# Patient Record
Sex: Female | Born: 1945 | Race: White | Hispanic: No | Smoking: Former smoker
Health system: Southern US, Community
[De-identification: ages and names within clinical notes are randomized; demographics above are authoritative.]

## PROBLEM LIST (undated history)

## (undated) DIAGNOSIS — M545 Low back pain, unspecified: Secondary | ICD-10-CM

## (undated) DIAGNOSIS — N3020 Other chronic cystitis without hematuria: Secondary | ICD-10-CM

## (undated) DIAGNOSIS — G43909 Migraine, unspecified, not intractable, without status migrainosus: Secondary | ICD-10-CM

## (undated) DIAGNOSIS — K589 Irritable bowel syndrome without diarrhea: Secondary | ICD-10-CM

## (undated) DIAGNOSIS — H269 Unspecified cataract: Secondary | ICD-10-CM

## (undated) DIAGNOSIS — Z860100 Personal history of colon polyps, unspecified: Secondary | ICD-10-CM

## (undated) DIAGNOSIS — I428 Other cardiomyopathies: Secondary | ICD-10-CM

## (undated) DIAGNOSIS — M797 Fibromyalgia: Secondary | ICD-10-CM

## (undated) DIAGNOSIS — G894 Chronic pain syndrome: Secondary | ICD-10-CM

## (undated) DIAGNOSIS — G8929 Other chronic pain: Secondary | ICD-10-CM

## (undated) DIAGNOSIS — M1990 Unspecified osteoarthritis, unspecified site: Secondary | ICD-10-CM

## (undated) DIAGNOSIS — T7840XA Allergy, unspecified, initial encounter: Secondary | ICD-10-CM

## (undated) DIAGNOSIS — E785 Hyperlipidemia, unspecified: Secondary | ICD-10-CM

## (undated) DIAGNOSIS — I779 Disorder of arteries and arterioles, unspecified: Secondary | ICD-10-CM

## (undated) DIAGNOSIS — K2270 Barrett's esophagus without dysplasia: Secondary | ICD-10-CM

## (undated) DIAGNOSIS — R42 Dizziness and giddiness: Secondary | ICD-10-CM

## (undated) DIAGNOSIS — E538 Deficiency of other specified B group vitamins: Secondary | ICD-10-CM

## (undated) DIAGNOSIS — Z9889 Other specified postprocedural states: Secondary | ICD-10-CM

## (undated) DIAGNOSIS — F419 Anxiety disorder, unspecified: Secondary | ICD-10-CM

## (undated) DIAGNOSIS — G4700 Insomnia, unspecified: Secondary | ICD-10-CM

## (undated) DIAGNOSIS — Z9289 Personal history of other medical treatment: Secondary | ICD-10-CM

## (undated) DIAGNOSIS — Z8489 Family history of other specified conditions: Secondary | ICD-10-CM

## (undated) DIAGNOSIS — K219 Gastro-esophageal reflux disease without esophagitis: Secondary | ICD-10-CM

## (undated) DIAGNOSIS — D649 Anemia, unspecified: Secondary | ICD-10-CM

## (undated) DIAGNOSIS — I5042 Chronic combined systolic (congestive) and diastolic (congestive) heart failure: Secondary | ICD-10-CM

## (undated) DIAGNOSIS — G5603 Carpal tunnel syndrome, bilateral upper limbs: Secondary | ICD-10-CM

## (undated) DIAGNOSIS — I10 Essential (primary) hypertension: Secondary | ICD-10-CM

## (undated) DIAGNOSIS — G2581 Restless legs syndrome: Secondary | ICD-10-CM

## (undated) DIAGNOSIS — A0472 Enterocolitis due to Clostridium difficile, not specified as recurrent: Secondary | ICD-10-CM

## (undated) DIAGNOSIS — F329 Major depressive disorder, single episode, unspecified: Secondary | ICD-10-CM

## (undated) DIAGNOSIS — F32A Depression, unspecified: Secondary | ICD-10-CM

## (undated) HISTORY — DX: Personal history of other medical treatment: Z92.89

## (undated) HISTORY — DX: Major depressive disorder, single episode, unspecified: F32.9

## (undated) HISTORY — DX: Insomnia, unspecified: G47.00

## (undated) HISTORY — PX: UPPER GASTROINTESTINAL ENDOSCOPY: SHX188

## (undated) HISTORY — DX: Anxiety disorder, unspecified: F41.9

## (undated) HISTORY — DX: Depression, unspecified: F32.A

## (undated) HISTORY — DX: Anemia, unspecified: D64.9

## (undated) HISTORY — DX: Carpal tunnel syndrome, bilateral upper limbs: G56.03

## (undated) HISTORY — DX: Barrett's esophagus without dysplasia: K22.70

## (undated) HISTORY — PX: OTHER SURGICAL HISTORY: SHX169

## (undated) HISTORY — PX: COLONOSCOPY: SHX174

## (undated) HISTORY — DX: Other chronic cystitis without hematuria: N30.20

## (undated) HISTORY — DX: Dizziness and giddiness: R42

## (undated) HISTORY — PX: OOPHORECTOMY: SHX86

## (undated) HISTORY — DX: Restless legs syndrome: G25.81

## (undated) HISTORY — DX: Enterocolitis due to Clostridium difficile, not specified as recurrent: A04.72

## (undated) HISTORY — DX: Unspecified cataract: H26.9

## (undated) HISTORY — PX: BILATERAL KNEE ARTHROSCOPY: SUR91

## (undated) HISTORY — PX: SHOULDER ARTHROSCOPY: SHX128

## (undated) HISTORY — DX: Unspecified osteoarthritis, unspecified site: M19.90

## (undated) HISTORY — DX: Allergy, unspecified, initial encounter: T78.40XA

## (undated) HISTORY — PX: COLONOSCOPY W/ POLYPECTOMY: SHX1380

## (undated) HISTORY — DX: Fibromyalgia: M79.7

## (undated) HISTORY — PX: LUMBAR DISC SURGERY: SHX700

## (undated) HISTORY — DX: Hyperlipidemia, unspecified: E78.5

## (undated) HISTORY — DX: Essential (primary) hypertension: I10

## (undated) HISTORY — DX: Gastro-esophageal reflux disease without esophagitis: K21.9

## (undated) HISTORY — DX: Other specified postprocedural states: Z98.890

## (undated) HISTORY — DX: Deficiency of other specified B group vitamins: E53.8

## (undated) HISTORY — PX: CATARACT EXTRACTION W/ INTRAOCULAR LENS  IMPLANT, BILATERAL: SHX1307

## (undated) HISTORY — PX: FOOT SURGERY: SHX648

## (undated) HISTORY — PX: VAGINAL HYSTERECTOMY: SUR661

---

## 1995-04-05 ENCOUNTER — Encounter: Payer: Self-pay | Admitting: Internal Medicine

## 1997-05-23 ENCOUNTER — Ambulatory Visit (HOSPITAL_BASED_OUTPATIENT_CLINIC_OR_DEPARTMENT_OTHER): Admission: RE | Admit: 1997-05-23 | Discharge: 1997-05-23 | Payer: Self-pay | Admitting: Orthopedic Surgery

## 1998-01-09 ENCOUNTER — Other Ambulatory Visit: Admission: RE | Admit: 1998-01-09 | Discharge: 1998-01-09 | Payer: Self-pay | Admitting: Gastroenterology

## 1998-01-09 ENCOUNTER — Encounter: Payer: Self-pay | Admitting: Gastroenterology

## 1998-11-27 ENCOUNTER — Emergency Department (HOSPITAL_COMMUNITY): Admission: EM | Admit: 1998-11-27 | Discharge: 1998-11-27 | Payer: Self-pay | Admitting: *Deleted

## 1998-11-27 ENCOUNTER — Encounter: Payer: Self-pay | Admitting: Emergency Medicine

## 1999-08-24 ENCOUNTER — Other Ambulatory Visit: Admission: RE | Admit: 1999-08-24 | Discharge: 1999-08-24 | Payer: Self-pay | Admitting: Gynecology

## 2001-03-23 ENCOUNTER — Encounter: Payer: Self-pay | Admitting: Gastroenterology

## 2002-06-04 ENCOUNTER — Other Ambulatory Visit: Admission: RE | Admit: 2002-06-04 | Discharge: 2002-06-04 | Payer: Self-pay | Admitting: Gynecology

## 2003-01-02 ENCOUNTER — Ambulatory Visit (HOSPITAL_COMMUNITY): Admission: RE | Admit: 2003-01-02 | Discharge: 2003-01-02 | Payer: Self-pay | Admitting: Cardiology

## 2003-12-19 ENCOUNTER — Ambulatory Visit: Payer: Self-pay | Admitting: Internal Medicine

## 2004-01-15 ENCOUNTER — Ambulatory Visit: Payer: Self-pay | Admitting: Internal Medicine

## 2004-02-12 ENCOUNTER — Encounter: Admission: RE | Admit: 2004-02-12 | Discharge: 2004-05-12 | Payer: Self-pay | Admitting: Internal Medicine

## 2004-12-01 ENCOUNTER — Ambulatory Visit: Payer: Self-pay | Admitting: Internal Medicine

## 2005-03-08 ENCOUNTER — Ambulatory Visit: Payer: Self-pay | Admitting: Internal Medicine

## 2005-08-06 ENCOUNTER — Ambulatory Visit: Payer: Self-pay | Admitting: Family Medicine

## 2005-11-26 ENCOUNTER — Encounter: Payer: Self-pay | Admitting: Internal Medicine

## 2005-12-07 ENCOUNTER — Encounter (INDEPENDENT_AMBULATORY_CARE_PROVIDER_SITE_OTHER): Payer: Self-pay | Admitting: *Deleted

## 2005-12-08 ENCOUNTER — Inpatient Hospital Stay (HOSPITAL_COMMUNITY): Admission: AD | Admit: 2005-12-08 | Discharge: 2005-12-10 | Payer: Self-pay | Admitting: Orthopedic Surgery

## 2006-02-04 ENCOUNTER — Ambulatory Visit: Payer: Self-pay | Admitting: Internal Medicine

## 2006-02-04 LAB — CONVERTED CEMR LAB
BUN: 15 mg/dL (ref 6–23)
CO2: 26 meq/L (ref 19–32)
Calcium: 9 mg/dL (ref 8.4–10.5)
Chloride: 104 meq/L (ref 96–112)
Creatinine, Ser: 0.9 mg/dL (ref 0.4–1.2)
GFR calc non Af Amer: 68 mL/min
Glomerular Filtration Rate, Af Am: 82 mL/min/{1.73_m2}
Glucose, Bld: 93 mg/dL (ref 70–99)
HCT: 36.6 % (ref 36.0–46.0)
Hemoglobin: 12.8 g/dL (ref 12.0–15.0)
MCHC: 35 g/dL (ref 30.0–36.0)
MCV: 89.2 fL (ref 78.0–100.0)
Platelets: 263 10*3/uL (ref 150–400)
Potassium: 3.7 meq/L (ref 3.5–5.1)
RBC: 4.11 M/uL (ref 3.87–5.11)
RDW: 13.3 % (ref 11.5–14.6)
Sodium: 139 meq/L (ref 135–145)
TSH: 0.62 microintl units/mL (ref 0.35–5.50)
WBC: 7 10*3/uL (ref 4.5–10.5)

## 2006-03-04 ENCOUNTER — Ambulatory Visit: Payer: Self-pay | Admitting: Internal Medicine

## 2006-03-08 ENCOUNTER — Ambulatory Visit: Payer: Self-pay | Admitting: Gastroenterology

## 2006-03-17 ENCOUNTER — Encounter: Admission: RE | Admit: 2006-03-17 | Discharge: 2006-03-17 | Payer: Self-pay | Admitting: Internal Medicine

## 2006-03-21 ENCOUNTER — Encounter: Payer: Self-pay | Admitting: Internal Medicine

## 2006-03-21 ENCOUNTER — Ambulatory Visit: Payer: Self-pay | Admitting: Gastroenterology

## 2006-04-05 ENCOUNTER — Ambulatory Visit: Payer: Self-pay

## 2006-07-25 ENCOUNTER — Encounter: Payer: Self-pay | Admitting: Internal Medicine

## 2006-07-25 ENCOUNTER — Emergency Department (HOSPITAL_COMMUNITY): Admission: EM | Admit: 2006-07-25 | Discharge: 2006-07-26 | Payer: Self-pay | Admitting: Emergency Medicine

## 2006-07-27 ENCOUNTER — Ambulatory Visit: Payer: Self-pay | Admitting: Internal Medicine

## 2006-07-27 DIAGNOSIS — M8580 Other specified disorders of bone density and structure, unspecified site: Secondary | ICD-10-CM | POA: Insufficient documentation

## 2006-07-27 DIAGNOSIS — R5383 Other fatigue: Secondary | ICD-10-CM | POA: Insufficient documentation

## 2006-07-27 DIAGNOSIS — I10 Essential (primary) hypertension: Secondary | ICD-10-CM | POA: Insufficient documentation

## 2006-07-27 DIAGNOSIS — G43909 Migraine, unspecified, not intractable, without status migrainosus: Secondary | ICD-10-CM | POA: Insufficient documentation

## 2006-07-27 DIAGNOSIS — R5382 Chronic fatigue, unspecified: Secondary | ICD-10-CM | POA: Insufficient documentation

## 2006-08-15 ENCOUNTER — Ambulatory Visit: Payer: Self-pay | Admitting: Internal Medicine

## 2006-08-16 LAB — CONVERTED CEMR LAB
BUN: 18 mg/dL (ref 6–23)
Basophils Absolute: 0 10*3/uL (ref 0.0–0.1)
Basophils Relative: 0.6 % (ref 0.0–1.0)
CO2: 29 meq/L (ref 19–32)
Calcium: 9.1 mg/dL (ref 8.4–10.5)
Chloride: 109 meq/L (ref 96–112)
Creatinine, Ser: 1 mg/dL (ref 0.4–1.2)
Eosinophils Absolute: 0.1 10*3/uL (ref 0.0–0.6)
Eosinophils Relative: 1.2 % (ref 0.0–5.0)
GFR calc Af Amer: 72 mL/min
GFR calc non Af Amer: 60 mL/min
Glucose, Bld: 115 mg/dL — ABNORMAL HIGH (ref 70–99)
HCT: 35.8 % — ABNORMAL LOW (ref 36.0–46.0)
Hemoglobin: 12.2 g/dL (ref 12.0–15.0)
Lymphocytes Relative: 31.9 % (ref 12.0–46.0)
MCHC: 33.9 g/dL (ref 30.0–36.0)
MCV: 91.7 fL (ref 78.0–100.0)
Monocytes Absolute: 0.4 10*3/uL (ref 0.2–0.7)
Monocytes Relative: 6.2 % (ref 3.0–11.0)
Neutro Abs: 4.3 10*3/uL (ref 1.4–7.7)
Neutrophils Relative %: 60.1 % (ref 43.0–77.0)
Platelets: 232 10*3/uL (ref 150–400)
Potassium: 3.9 meq/L (ref 3.5–5.1)
RBC: 3.91 M/uL (ref 3.87–5.11)
RDW: 13.6 % (ref 11.5–14.6)
Sodium: 143 meq/L (ref 135–145)
TSH: 0.95 microintl units/mL (ref 0.35–5.50)
WBC: 7 10*3/uL (ref 4.5–10.5)

## 2006-09-05 ENCOUNTER — Ambulatory Visit: Payer: Self-pay | Admitting: Pulmonary Disease

## 2006-10-11 ENCOUNTER — Ambulatory Visit: Payer: Self-pay | Admitting: Pulmonary Disease

## 2006-10-25 ENCOUNTER — Ambulatory Visit (HOSPITAL_BASED_OUTPATIENT_CLINIC_OR_DEPARTMENT_OTHER): Admission: RE | Admit: 2006-10-25 | Discharge: 2006-10-25 | Payer: Self-pay | Admitting: Pulmonary Disease

## 2006-10-25 ENCOUNTER — Encounter: Payer: Self-pay | Admitting: Pulmonary Disease

## 2006-11-08 ENCOUNTER — Ambulatory Visit: Payer: Self-pay | Admitting: Pulmonary Disease

## 2006-11-16 ENCOUNTER — Ambulatory Visit: Payer: Self-pay | Admitting: Pulmonary Disease

## 2006-12-05 ENCOUNTER — Telehealth (INDEPENDENT_AMBULATORY_CARE_PROVIDER_SITE_OTHER): Payer: Self-pay | Admitting: *Deleted

## 2006-12-13 ENCOUNTER — Ambulatory Visit: Payer: Self-pay | Admitting: Internal Medicine

## 2006-12-16 LAB — CONVERTED CEMR LAB
BUN: 17 mg/dL (ref 6–23)
CO2: 27 meq/L (ref 19–32)
Calcium: 8.8 mg/dL (ref 8.4–10.5)
Chloride: 105 meq/L (ref 96–112)
Creatinine, Ser: 1 mg/dL (ref 0.4–1.2)
GFR calc Af Amer: 72 mL/min
GFR calc non Af Amer: 60 mL/min
Glucose, Bld: 101 mg/dL — ABNORMAL HIGH (ref 70–99)
Potassium: 3.7 meq/L (ref 3.5–5.1)
Sodium: 141 meq/L (ref 135–145)

## 2007-02-09 LAB — CONVERTED CEMR LAB: Pap Smear: NORMAL

## 2007-03-07 ENCOUNTER — Ambulatory Visit: Payer: Self-pay | Admitting: Internal Medicine

## 2007-03-07 DIAGNOSIS — R1084 Generalized abdominal pain: Secondary | ICD-10-CM | POA: Insufficient documentation

## 2007-03-07 LAB — CONVERTED CEMR LAB
Bacteria, UA: NONE SEEN
Bilirubin Urine: NEGATIVE
Blood in Urine, dipstick: NEGATIVE
Glucose, Urine, Semiquant: NEGATIVE
Ketones, urine, test strip: NEGATIVE
Nitrite: POSITIVE
Protein, U semiquant: NEGATIVE
RBC / HPF: NONE SEEN (ref <3)
Specific Gravity, Urine: 1.02
Urobilinogen, UA: NEGATIVE
WBC Urine, dipstick: NEGATIVE
WBC, UA: NONE SEEN cells/hpf (ref <3)
pH: 5

## 2007-03-08 ENCOUNTER — Encounter: Payer: Self-pay | Admitting: Internal Medicine

## 2007-03-09 ENCOUNTER — Encounter: Payer: Self-pay | Admitting: Internal Medicine

## 2007-03-11 LAB — CONVERTED CEMR LAB
ALT: 14 units/L (ref 0–35)
AST: 17 units/L (ref 0–37)
Albumin: 3.9 g/dL (ref 3.5–5.2)
Alkaline Phosphatase: 65 units/L (ref 39–117)
Amylase: 55 units/L (ref 27–131)
BUN: 20 mg/dL (ref 6–23)
Basophils Absolute: 0.1 10*3/uL (ref 0.0–0.1)
Basophils Relative: 0.8 % (ref 0.0–1.0)
Bilirubin, Direct: 0.1 mg/dL (ref 0.0–0.3)
CO2: 29 meq/L (ref 19–32)
Calcium: 9.3 mg/dL (ref 8.4–10.5)
Chloride: 104 meq/L (ref 96–112)
Creatinine, Ser: 1 mg/dL (ref 0.4–1.2)
Eosinophils Absolute: 0.1 10*3/uL (ref 0.0–0.6)
Eosinophils Relative: 0.8 % (ref 0.0–5.0)
Folate: >20 ng/mL
GFR calc Af Amer: 72 mL/min
GFR calc non Af Amer: 60 mL/min
Glucose, Bld: 104 mg/dL — ABNORMAL HIGH (ref 70–99)
HCT: 36.8 % (ref 36.0–46.0)
Hemoglobin: 12 g/dL (ref 12.0–15.0)
Iron: 81 ug/dL (ref 42–145)
Lipase: 18 units/L (ref 11.0–59.0)
Lymphocytes Relative: 32.4 % (ref 12.0–46.0)
MCHC: 32.7 g/dL (ref 30.0–36.0)
MCV: 94.4 fL (ref 78.0–100.0)
Monocytes Absolute: 0.5 10*3/uL (ref 0.2–0.7)
Monocytes Relative: 6.2 % (ref 3.0–11.0)
Neutro Abs: 4.5 10*3/uL (ref 1.4–7.7)
Neutrophils Relative %: 59.8 % (ref 43.0–77.0)
Platelets: 234 10*3/uL (ref 150–400)
Potassium: 4.3 meq/L (ref 3.5–5.1)
RBC: 3.9 M/uL (ref 3.87–5.11)
RDW: 12.5 % (ref 11.5–14.6)
Saturation Ratios: 25 % (ref 20.0–50.0)
Sodium: 141 meq/L (ref 135–145)
TSH: 1.01 microintl units/mL (ref 0.35–5.50)
Total Bilirubin: 0.8 mg/dL (ref 0.3–1.2)
Total Protein: 7.4 g/dL (ref 6.0–8.3)
Transferrin: 231.4 mg/dL (ref >=212.0)
Vitamin B-12: 250 pg/mL (ref 211–911)
WBC: 7.7 10*3/uL (ref 4.5–10.5)

## 2007-03-13 ENCOUNTER — Telehealth (INDEPENDENT_AMBULATORY_CARE_PROVIDER_SITE_OTHER): Payer: Self-pay | Admitting: *Deleted

## 2007-03-21 ENCOUNTER — Ambulatory Visit: Payer: Self-pay | Admitting: Internal Medicine

## 2007-03-22 ENCOUNTER — Telehealth (INDEPENDENT_AMBULATORY_CARE_PROVIDER_SITE_OTHER): Payer: Self-pay | Admitting: *Deleted

## 2007-03-28 ENCOUNTER — Ambulatory Visit: Payer: Self-pay | Admitting: Cardiovascular Disease

## 2007-03-30 ENCOUNTER — Encounter (INDEPENDENT_AMBULATORY_CARE_PROVIDER_SITE_OTHER): Payer: Self-pay | Admitting: *Deleted

## 2007-03-30 ENCOUNTER — Telehealth (INDEPENDENT_AMBULATORY_CARE_PROVIDER_SITE_OTHER): Payer: Self-pay | Admitting: *Deleted

## 2007-04-05 ENCOUNTER — Ambulatory Visit: Payer: Self-pay | Admitting: Internal Medicine

## 2007-04-07 ENCOUNTER — Telehealth (INDEPENDENT_AMBULATORY_CARE_PROVIDER_SITE_OTHER): Payer: Self-pay | Admitting: *Deleted

## 2007-04-18 ENCOUNTER — Ambulatory Visit: Payer: Self-pay | Admitting: Internal Medicine

## 2007-04-21 LAB — CONVERTED CEMR LAB
Amylase: 52 units/L (ref 27–131)
Cholesterol: 177 mg/dL (ref 0–200)
HDL: 28.6 mg/dL — ABNORMAL LOW (ref >39.0)
LDL Cholesterol: 123 mg/dL — ABNORMAL HIGH (ref 0–99)
Lipase: 15 units/L (ref 11.0–59.0)
Total CHOL/HDL Ratio: 6.2
Triglycerides: 128 mg/dL (ref 0–149)
VLDL: 26 mg/dL (ref 0–40)

## 2007-04-24 ENCOUNTER — Encounter (INDEPENDENT_AMBULATORY_CARE_PROVIDER_SITE_OTHER): Payer: Self-pay | Admitting: *Deleted

## 2007-05-11 ENCOUNTER — Ambulatory Visit: Payer: Self-pay | Admitting: Pulmonary Disease

## 2007-05-11 DIAGNOSIS — G2581 Restless legs syndrome: Secondary | ICD-10-CM | POA: Insufficient documentation

## 2007-05-12 ENCOUNTER — Ambulatory Visit: Payer: Self-pay | Admitting: Gastroenterology

## 2007-05-15 ENCOUNTER — Encounter: Payer: Self-pay | Admitting: Internal Medicine

## 2007-05-15 ENCOUNTER — Ambulatory Visit: Payer: Self-pay | Admitting: Gastroenterology

## 2007-05-15 ENCOUNTER — Encounter: Payer: Self-pay | Admitting: Gastroenterology

## 2007-06-15 ENCOUNTER — Ambulatory Visit: Payer: Self-pay | Admitting: Gastroenterology

## 2007-06-28 ENCOUNTER — Telehealth: Payer: Self-pay | Admitting: Gastroenterology

## 2007-07-19 ENCOUNTER — Ambulatory Visit: Payer: Self-pay | Admitting: Internal Medicine

## 2007-07-19 DIAGNOSIS — K2270 Barrett's esophagus without dysplasia: Secondary | ICD-10-CM | POA: Insufficient documentation

## 2007-08-11 ENCOUNTER — Encounter: Payer: Self-pay | Admitting: Internal Medicine

## 2007-08-14 ENCOUNTER — Telehealth: Payer: Self-pay | Admitting: Gastroenterology

## 2007-10-25 ENCOUNTER — Telehealth (INDEPENDENT_AMBULATORY_CARE_PROVIDER_SITE_OTHER): Payer: Self-pay | Admitting: *Deleted

## 2007-10-25 ENCOUNTER — Ambulatory Visit: Payer: Self-pay | Admitting: Internal Medicine

## 2007-10-25 LAB — CONVERTED CEMR LAB
Bilirubin Urine: NEGATIVE
Blood in Urine, dipstick: NEGATIVE
Glucose, Urine, Semiquant: NEGATIVE
Ketones, urine, test strip: NEGATIVE
Nitrite: NEGATIVE
Protein, U semiquant: NEGATIVE
Specific Gravity, Urine: <1.005
Urobilinogen, UA: 0.2
WBC Urine, dipstick: NEGATIVE
pH: 7.5

## 2007-10-27 ENCOUNTER — Telehealth: Payer: Self-pay | Admitting: Internal Medicine

## 2007-11-09 ENCOUNTER — Telehealth (INDEPENDENT_AMBULATORY_CARE_PROVIDER_SITE_OTHER): Payer: Self-pay | Admitting: *Deleted

## 2007-12-11 ENCOUNTER — Ambulatory Visit: Payer: Self-pay | Admitting: Internal Medicine

## 2007-12-11 LAB — CONVERTED CEMR LAB
Bilirubin Urine: NEGATIVE
Blood in Urine, dipstick: NEGATIVE
Glucose, Urine, Semiquant: NEGATIVE
Ketones, urine, test strip: NEGATIVE
Nitrite: NEGATIVE
Protein, U semiquant: NEGATIVE
Specific Gravity, Urine: 1.01
Urobilinogen, UA: 0.2
WBC Urine, dipstick: NEGATIVE
pH: 5

## 2007-12-15 ENCOUNTER — Telehealth (INDEPENDENT_AMBULATORY_CARE_PROVIDER_SITE_OTHER): Payer: Self-pay | Admitting: *Deleted

## 2007-12-15 LAB — CONVERTED CEMR LAB
ALT: 13 units/L (ref 0–35)
AST: 18 units/L (ref 0–37)
Albumin: 3.8 g/dL (ref 3.5–5.2)
Alkaline Phosphatase: 81 units/L (ref 39–117)
BUN: 21 mg/dL (ref 6–23)
Bilirubin, Direct: 0.1 mg/dL (ref 0.0–0.3)
CO2: 28 meq/L (ref 19–32)
Calcium: 8.6 mg/dL (ref 8.4–10.5)
Chloride: 108 meq/L (ref 96–112)
Creatinine, Ser: 1 mg/dL (ref 0.4–1.2)
GFR calc Af Amer: 72 mL/min
GFR calc non Af Amer: 60 mL/min
Glucose, Bld: 92 mg/dL (ref 70–99)
Potassium: 3.7 meq/L (ref 3.5–5.1)
Sodium: 143 meq/L (ref 135–145)
Total Bilirubin: 0.8 mg/dL (ref 0.3–1.2)
Total Protein: 7.4 g/dL (ref 6.0–8.3)
Vit D, 1,25-Dihydroxy: 27 — ABNORMAL LOW (ref 30–89)

## 2008-01-02 ENCOUNTER — Encounter: Payer: Self-pay | Admitting: Internal Medicine

## 2008-01-09 ENCOUNTER — Telehealth (INDEPENDENT_AMBULATORY_CARE_PROVIDER_SITE_OTHER): Payer: Self-pay | Admitting: *Deleted

## 2008-01-10 ENCOUNTER — Ambulatory Visit: Payer: Self-pay | Admitting: Internal Medicine

## 2008-02-22 ENCOUNTER — Ambulatory Visit: Payer: Self-pay | Admitting: Family Medicine

## 2008-04-09 ENCOUNTER — Ambulatory Visit: Payer: Self-pay | Admitting: Internal Medicine

## 2008-04-12 ENCOUNTER — Telehealth (INDEPENDENT_AMBULATORY_CARE_PROVIDER_SITE_OTHER): Payer: Self-pay | Admitting: *Deleted

## 2008-04-12 LAB — CONVERTED CEMR LAB
Cholesterol: 239 mg/dL (ref 0–200)
Direct LDL: 176.9 mg/dL
HDL: 40.3 mg/dL (ref >39.0)
Total CHOL/HDL Ratio: 5.9
Triglycerides: 115 mg/dL (ref 0–149)
VLDL: 23 mg/dL (ref 0–40)
Vit D, 25-Hydroxy: 30 ng/mL (ref 30–89)

## 2008-04-24 ENCOUNTER — Encounter: Payer: Self-pay | Admitting: Internal Medicine

## 2008-04-24 ENCOUNTER — Encounter: Admission: RE | Admit: 2008-04-24 | Discharge: 2008-04-24 | Payer: Self-pay | Admitting: Internal Medicine

## 2008-05-20 ENCOUNTER — Telehealth (INDEPENDENT_AMBULATORY_CARE_PROVIDER_SITE_OTHER): Payer: Self-pay | Admitting: *Deleted

## 2008-05-28 ENCOUNTER — Ambulatory Visit: Payer: Self-pay | Admitting: Pulmonary Disease

## 2008-07-15 ENCOUNTER — Ambulatory Visit: Payer: Self-pay | Admitting: Gastroenterology

## 2008-08-04 LAB — CONVERTED CEMR LAB
ALT: 11 units/L
AST: 16 units/L
Albumin: 4 g/dL
Alkaline Phosphatase: 78 units/L
BUN: 26 mg/dL
Cholesterol: 222 mg/dL
Creatinine, Ser: 1.1 mg/dL
Globulin: 2.6 g/dL
Glucose, Bld: 97 mg/dL
HDL: 51 mg/dL
LDL Cholesterol: 144 mg/dL
Total Bilirubin: 0.4 mg/dL
Total Protein: 6.6 g/dL
Triglycerides: 133 mg/dL

## 2008-08-05 ENCOUNTER — Encounter: Payer: Self-pay | Admitting: Internal Medicine

## 2008-08-30 ENCOUNTER — Ambulatory Visit: Payer: Self-pay | Admitting: Family Medicine

## 2008-10-21 ENCOUNTER — Ambulatory Visit: Payer: Self-pay | Admitting: Internal Medicine

## 2008-10-21 DIAGNOSIS — M1990 Unspecified osteoarthritis, unspecified site: Secondary | ICD-10-CM | POA: Insufficient documentation

## 2008-10-24 ENCOUNTER — Ambulatory Visit: Payer: Self-pay

## 2008-10-24 ENCOUNTER — Encounter: Payer: Self-pay | Admitting: Internal Medicine

## 2008-12-19 ENCOUNTER — Encounter: Payer: Self-pay | Admitting: Internal Medicine

## 2009-01-10 ENCOUNTER — Encounter: Payer: Self-pay | Admitting: Internal Medicine

## 2009-02-11 ENCOUNTER — Telehealth (INDEPENDENT_AMBULATORY_CARE_PROVIDER_SITE_OTHER): Payer: Self-pay | Admitting: *Deleted

## 2009-02-12 ENCOUNTER — Ambulatory Visit: Payer: Self-pay | Admitting: Internal Medicine

## 2009-04-01 ENCOUNTER — Ambulatory Visit: Payer: Self-pay | Admitting: Internal Medicine

## 2009-04-01 DIAGNOSIS — R209 Unspecified disturbances of skin sensation: Secondary | ICD-10-CM | POA: Insufficient documentation

## 2009-04-01 DIAGNOSIS — K5900 Constipation, unspecified: Secondary | ICD-10-CM | POA: Insufficient documentation

## 2009-04-09 ENCOUNTER — Telehealth (INDEPENDENT_AMBULATORY_CARE_PROVIDER_SITE_OTHER): Payer: Self-pay | Admitting: *Deleted

## 2009-04-09 LAB — CONVERTED CEMR LAB
ALT: 15 units/L (ref 0–35)
AST: 19 units/L (ref 0–37)
BUN: 24 mg/dL — ABNORMAL HIGH (ref 6–23)
Basophils Relative: 2 % (ref 0.0–3.0)
CO2: 25 meq/L (ref 19–32)
Calcium: 8.9 mg/dL (ref 8.4–10.5)
Chloride: 110 meq/L (ref 96–112)
Cholesterol: 210 mg/dL — ABNORMAL HIGH (ref 0–200)
Creatinine, Ser: 1.1 mg/dL (ref 0.4–1.2)
Direct LDL: 168.6 mg/dL
Eosinophils Relative: 1 % (ref 0.0–5.0)
Folate: 17.8 ng/mL
GFR calc non Af Amer: 53.06 mL/min (ref >60)
Glucose, Bld: 87 mg/dL (ref 70–99)
HCT: 36.4 % (ref 36.0–46.0)
HDL: 37.8 mg/dL — ABNORMAL LOW (ref >39.00)
Hemoglobin: 12 g/dL (ref 12.0–15.0)
Lymphocytes Relative: 53 % — ABNORMAL HIGH (ref 12.0–46.0)
MCHC: 32.9 g/dL (ref 30.0–36.0)
MCV: 95.7 fL (ref 78.0–100.0)
Monocytes Relative: 3 % (ref 3.0–12.0)
Neutrophils Relative %: 41 % — ABNORMAL LOW (ref 43.0–77.0)
Platelets: 204 10*3/uL (ref 150.0–400.0)
Potassium: 4.2 meq/L (ref 3.5–5.1)
RBC: 3.8 M/uL — ABNORMAL LOW (ref 3.87–5.11)
RDW: 12.8 % (ref 11.5–14.6)
Sodium: 141 meq/L (ref 135–145)
TSH: 1.25 microintl units/mL (ref 0.35–5.50)
Total CHOL/HDL Ratio: 6
Triglycerides: 120 mg/dL (ref 0.0–149.0)
VLDL: 24 mg/dL (ref 0.0–40.0)
Vitamin B-12: 174 pg/mL — ABNORMAL LOW (ref 211–911)
WBC: 6.4 10*3/uL (ref 4.5–10.5)

## 2009-05-22 ENCOUNTER — Ambulatory Visit: Payer: Self-pay | Admitting: Pulmonary Disease

## 2009-06-13 ENCOUNTER — Telehealth: Payer: Self-pay | Admitting: Internal Medicine

## 2009-09-03 ENCOUNTER — Telehealth: Payer: Self-pay | Admitting: Gastroenterology

## 2009-09-22 ENCOUNTER — Telehealth (INDEPENDENT_AMBULATORY_CARE_PROVIDER_SITE_OTHER): Payer: Self-pay | Admitting: *Deleted

## 2009-09-29 ENCOUNTER — Ambulatory Visit: Payer: Self-pay | Admitting: Internal Medicine

## 2009-09-29 DIAGNOSIS — R42 Dizziness and giddiness: Secondary | ICD-10-CM | POA: Insufficient documentation

## 2009-10-03 LAB — CONVERTED CEMR LAB
BUN: 25 mg/dL — ABNORMAL HIGH (ref 6–23)
CO2: 25 meq/L (ref 19–32)
Calcium: 8.8 mg/dL (ref 8.4–10.5)
Chloride: 111 meq/L (ref 96–112)
Creatinine, Ser: 1.1 mg/dL (ref 0.4–1.2)
GFR calc non Af Amer: 54.7 mL/min (ref >60)
Glucose, Bld: 95 mg/dL (ref 70–99)
Potassium: 3.9 meq/L (ref 3.5–5.1)
Sodium: 145 meq/L (ref 135–145)

## 2009-10-20 ENCOUNTER — Encounter: Admission: RE | Admit: 2009-10-20 | Discharge: 2009-10-20 | Payer: Self-pay | Admitting: Otolaryngology

## 2009-10-30 ENCOUNTER — Encounter: Payer: Self-pay | Admitting: Gastroenterology

## 2009-11-03 ENCOUNTER — Ambulatory Visit: Payer: Self-pay | Admitting: Gastroenterology

## 2009-11-03 DIAGNOSIS — R198 Other specified symptoms and signs involving the digestive system and abdomen: Secondary | ICD-10-CM | POA: Insufficient documentation

## 2009-11-25 ENCOUNTER — Ambulatory Visit: Payer: Self-pay | Admitting: Gastroenterology

## 2009-11-30 ENCOUNTER — Encounter: Payer: Self-pay | Admitting: Gastroenterology

## 2010-01-07 ENCOUNTER — Ambulatory Visit: Payer: Self-pay | Admitting: Cardiology

## 2010-01-07 ENCOUNTER — Inpatient Hospital Stay (HOSPITAL_COMMUNITY)
Admission: EM | Admit: 2010-01-07 | Discharge: 2010-01-09 | Payer: Self-pay | Source: Home / Self Care | Admitting: Emergency Medicine

## 2010-01-07 ENCOUNTER — Telehealth: Payer: Self-pay | Admitting: Internal Medicine

## 2010-01-08 ENCOUNTER — Encounter: Payer: Self-pay | Admitting: Cardiovascular Disease

## 2010-01-09 ENCOUNTER — Encounter: Payer: Self-pay | Admitting: Internal Medicine

## 2010-01-09 ENCOUNTER — Encounter: Payer: Self-pay | Admitting: Cardiovascular Disease

## 2010-01-14 ENCOUNTER — Ambulatory Visit: Payer: Self-pay | Admitting: Internal Medicine

## 2010-01-14 ENCOUNTER — Encounter: Payer: Self-pay | Admitting: Internal Medicine

## 2010-01-14 DIAGNOSIS — R079 Chest pain, unspecified: Secondary | ICD-10-CM | POA: Insufficient documentation

## 2010-01-14 LAB — CONVERTED CEMR LAB: INR: 11.9

## 2010-01-22 ENCOUNTER — Encounter: Payer: Self-pay | Admitting: Cardiovascular Disease

## 2010-01-22 ENCOUNTER — Ambulatory Visit: Payer: Self-pay | Admitting: Cardiovascular Disease

## 2010-01-22 HISTORY — PX: CARDIAC CATHETERIZATION: SHX172

## 2010-01-23 LAB — CONVERTED CEMR LAB
BUN: 21 mg/dL (ref 6–23)
Basophils Absolute: 0.2 10*3/uL — ABNORMAL HIGH (ref 0.0–0.1)
Basophils Relative: 2.1 % (ref 0.0–3.0)
CO2: 23 meq/L (ref 19–32)
Calcium: 8.7 mg/dL (ref 8.4–10.5)
Chloride: 112 meq/L (ref 96–112)
Creatinine, Ser: 1 mg/dL (ref 0.4–1.2)
Eosinophils Absolute: 0.1 10*3/uL (ref 0.0–0.7)
Eosinophils Relative: 1.4 % (ref 0.0–5.0)
GFR calc non Af Amer: 56.47 mL/min — ABNORMAL LOW (ref >60.00)
Glucose, Bld: 86 mg/dL (ref 70–99)
HCT: 35.9 % — ABNORMAL LOW (ref 36.0–46.0)
Hemoglobin: 12.3 g/dL (ref 12.0–15.0)
INR: 1.2 — ABNORMAL HIGH (ref 0.8–1.0)
Lymphocytes Relative: 37.7 % (ref 12.0–46.0)
Lymphs Abs: 2.8 10*3/uL (ref 0.7–4.0)
MCHC: 34.2 g/dL (ref 30.0–36.0)
MCV: 94.4 fL (ref 78.0–100.0)
Monocytes Absolute: 0.4 10*3/uL (ref 0.1–1.0)
Monocytes Relative: 5.2 % (ref 3.0–12.0)
Neutro Abs: 3.9 10*3/uL (ref 1.4–7.7)
Neutrophils Relative %: 53.6 % (ref 43.0–77.0)
Platelets: 212 10*3/uL (ref 150.0–400.0)
Potassium: 4.1 meq/L (ref 3.5–5.1)
Prothrombin Time: 12.3 s — ABNORMAL HIGH (ref 9.7–11.8)
RBC: 3.8 M/uL — ABNORMAL LOW (ref 3.87–5.11)
RDW: 13.7 % (ref 11.5–14.6)
Sodium: 141 meq/L (ref 135–145)
WBC: 7.3 10*3/uL (ref 4.5–10.5)

## 2010-01-28 ENCOUNTER — Ambulatory Visit
Admission: RE | Admit: 2010-01-28 | Payer: Self-pay | Source: Home / Self Care | Attending: Cardiovascular Disease | Admitting: Cardiovascular Disease

## 2010-02-16 ENCOUNTER — Ambulatory Visit
Admission: RE | Admit: 2010-02-16 | Discharge: 2010-02-16 | Payer: Self-pay | Source: Home / Self Care | Attending: Cardiovascular Disease | Admitting: Cardiovascular Disease

## 2010-03-10 ENCOUNTER — Ambulatory Visit
Admission: RE | Admit: 2010-03-10 | Discharge: 2010-03-10 | Payer: Self-pay | Source: Home / Self Care | Attending: Gastroenterology | Admitting: Gastroenterology

## 2010-03-10 NOTE — Letter (Signed)
Summary: [facility]  [facility]   Imported By: Lanelle Bal 05/01/2008 10:58:01  _____________________________________________________________________  External Attachment:    Type:   Image     Comment:   External Document

## 2010-03-10 NOTE — Progress Notes (Signed)
Summary: lab results  Phone Note Outgoing Call Call back at [facility] Phone [phone]   Summary of Call: LAB RESULTS:  - vit d borderline low  - start vit d 50,000u qwk then OTC vit D 1200 u daily  - chol used to be better  - diet-exercise  - ?nutritionist referral  - if unable to change lifestyle - medication copy of diet infor/labs mailed to pt ...Marland KitchenMarland KitchenShary Decamp  April 12, 2008 2:31 PM   Follow-up for Phone Call        lmom for pt to return call .Marland KitchenShary Decamp  April 12, 2008 2:34 PM discussed with pt - referral done .Shary Decamp  April 12, 2008 2:52 PM      New/Updated Medications: ERGOCALCIFEROL 50000 UNIT CAPS (ERGOCALCIFEROL) 1 by mouth qwk   Prescriptions: ERGOCALCIFEROL 50000 UNIT CAPS (ERGOCALCIFEROL) 1 by mouth qwk  #12 x 0   Entered by:   Shary Decamp   Authorized by:   Nolon Rod. Paz MD   Signed by:   Shary Decamp on 04/12/2008   Method used:   Electronically to        [facility] [phone]* (retail)       [address] [place], [place]  [address]       Ph: [phone]       Fax: [phone]   RxID:   [id number]

## 2010-03-10 NOTE — Assessment & Plan Note (Signed)
Summary: acute only - cough/diarrhea/swh   Vital Signs:  Patient Profile:   65 Years Old Female Weight:      227.6 pounds Temp:     99.9 degrees F BP sitting:   110 / 70  Vitals Entered By: Shary Decamp (October 25, 2007 4:07 PM)                 Referred by:  Russella Dar PCP:  Drue Novel  Chief Complaint:  cough, nasal congestion, diarrhea, and chills  x 10 days.  History of Present Illness: sx on-off x 10 days  cough--dry and intense at times  nasal congestion diarrhea-- watery, no blood, had some imodium chills , no fever    Updated Prior Medication List: ESTRATEST H.S. 0.625-1.25 MG  TABS (EST ESTROGENS-METHYLTEST) Take 1 tablet by mouth once a day ATENOLOL 25 MG TABS (ATENOLOL) 1 by mouth once daily HYDROCHLOROTHIAZIDE 25 MG TABS (HYDROCHLOROTHIAZIDE) 1 by mouth once daily BENAZEPRIL HCL 10 MG  TABS (BENAZEPRIL HCL) 2 by mouth once daily TRAZODONE HCL 50 MG  TABS (TRAZODONE HCL) 2 by mouth qhs ROPINIROLE HCL 1 MG  TABS (ROPINIROLE HCL) 1 by mouth qhs TOPAMAX 100 MG  TABS (TOPIRAMATE) 1 1/2 by mouth qd KAPIDEX 60 MG  CPDR (DEXLANSOPRAZOLE) Take one capsule by mouth 30 minutes before breakfast RANITIDINE HCL 150 MG  TABS (RANITIDINE HCL) 1 by mouth two times a day * ASA  * VITAMINS  HYDROCODONE-ACETAMINOPHEN 10-325 MG  TABS (HYDROCODONE-ACETAMINOPHEN) 2-4 QD AXERT 12.5 MG  TABS (ALMOTRIPTAN MALATE) PRN MECLIZINE HCL 12.5 MG  TABS (MECLIZINE HCL) PRN FLEXERIL 10 MG  TABS (CYCLOBENZAPRINE HCL) PRN FOSAMAX 70 MG  TABS (ALENDRONATE SODIUM) 1 by mouth once a  week  Current Allergies (reviewed today): ! PCN  Past Medical History:    Reviewed history from 07/19/2007 and no changes required:       Hyperlipidemia       Hypertension       Osteoporosis       (-) cath 12-2002       GASTRITIS, ESOPHAGITIS , BARRETTS ESOPHAGUS-- EGD 05-2007       PERSISTENT DISORDER INITIATING/MAINTAINING SLEEP (ICD-307.42)       RESTLESS LEGS SYNDROME (ICD-333.94)       BACK PAIN, CHRONIC  (ICD-724.5)       MENOPAUSE, SURGICAL (ICD-627.4)       MIGRAINE HEADACHE   Past Surgical History:    Reviewed history from 12/13/2006 and no changes required:       Hysterectomy for endometriosis       Oophorectomy     Review of Systems       appetite was poor last week ,better now no Nausea or vomiting no abdominal pain  no dysuria no myalgias   Physical Exam  General:     alert.  Looks tired but no toxic Eyes:     not pale Ears:     R ear normal and L ear normal.   Nose:     slt congested Mouth:     no red Lungs:     normal respiratory effort, no intercostal retractions, no accessory muscle use, and normal breath sounds.   Heart:     normal rate, regular rhythm, and no murmur.   Abdomen:     soft, no distention, no masses, no rigidity, no hepatomegaly, and no splenomegaly.  + tenderness at lower abd. bilaterally  Extremities:     no pretibial edema bilaterally     Impression & Recommendations:  Problem # 1:  Has both GI and URI type of sx viral infection? see instructions  pt know to call if  develops abdominal pain  Problem # 2:  VIRAL INFECTION-UNSPEC (ICD-079.99) see #1 Orders: UA Dipstick w/o Micro (manual) (16109)   Complete Medication List: 1)  Estratest H.s. 0.625-1.25 Mg Tabs (Est estrogens-methyltest) .... Take 1 tablet by mouth once a day 2)  Atenolol 25 Mg Tabs (Atenolol) .[name].. 1 by mouth once daily 3)  Hydrochlorothiazide 25 Mg Tabs (Hydrochlorothiazide) .[name].. 1 by mouth once daily 4)  Benazepril Hcl 10 Mg Tabs (Benazepril hcl) .... 2 by mouth once daily 5)  Trazodone Hcl 50 Mg Tabs (Trazodone hcl) .... 2 by mouth qhs 6)  Ropinirole Hcl 1 Mg Tabs (Ropinirole hcl) .[name].. 1 by mouth qhs 7)  Topamax 100 Mg Tabs (Topiramate) .[name].. 1 1/2 by mouth qd 8)  Kapidex 60 Mg Cpdr (Dexlansoprazole) .... Take one capsule by mouth 30 minutes before breakfast 9)  Ranitidine Hcl 150 Mg Tabs (Ranitidine hcl) .[name].. 1 by mouth two times a day 10)  Asa  11)   Vitamins  12)  Hydrocodone-acetaminophen 10-325 Mg Tabs (Hydrocodone-acetaminophen) .... 2-4 qd 13)  Axert 12.5 Mg Tabs (Almotriptan malate) .... Prn 14)  Meclizine Hcl 12.5 Mg Tabs (Meclizine hcl) .... Prn 15)  Flexeril 10 Mg Tabs (Cyclobenzaprine hcl) .... Prn 16)  Fosamax 70 Mg Tabs (Alendronate sodium) .[name].. 1 by mouth once a  week   Patient Instructions: 1)  rest  2)  fluids 3)  Mucinex DM 4)  saline nasal spray 5)  ok to use a small amount of imodium 6)  call if no better in few days 7)  call any time or go to the ER if severe symptoms   ] Laboratory Results   Urine Tests    Routine Urinalysis   Glucose: negative   (Normal Range: Negative) Bilirubin: negative   (Normal Range: Negative) Ketone: negative   (Normal Range: Negative) Spec. Gravity: <1.005   (Normal Range: 1.003-1.035) Blood: negative   (Normal Range: Negative) pH: 7.5   (Normal Range: 5.0-8.0) Protein: negative   (Normal Range: Negative) Urobilinogen: 0.2   (Normal Range: 0-1) Nitrite: negative   (Normal Range: Negative) Leukocyte Esterace: negative   (Normal Range: Negative)

## 2010-03-10 NOTE — Progress Notes (Signed)
Summary: MEDS   Phone Note Call from Patient Call back at Home Phone [phone]   Caller: Patient Call For: DR Russella Dar Reason for Call: Refill Medication Details for Reason: meds Summary of Call: Ins co will NOT refill RX- Kapidex. Until they hear from our office. pt only has 1 pill left. [facility] [place]. [phone] option [id number] Initial call taken by: Guadlupe Spanish [facility],  August 14, 2007 8:51 AM  Follow-up for Phone Call        Pt states that her insurance company will not cover Kapidex. Informed pt that I will call insurance company and get a PA or switch pt to something else her insurance company will cover. Pt states she has tryed Protonix, Prilosec, and Zantac. Follow-up by: Christie Nottingham CMA,  August 15, 2007 8:18 AM  Additional Follow-up for Phone Call Additional follow up Details #1::        PA was done and approved. Pt notified.  Additional Follow-up by: Christie Nottingham CMA,  August 18, 2007 11:45 AM

## 2010-03-10 NOTE — Progress Notes (Signed)
  Phone Note Call from Patient   Caller: Patient Summary of Call: pt called and left msg, has appt tomorrow at 10am for cough,congestion,wanted to know if Dr Drue Novel can call in something today.  Called pt got VM, left msg will need to be seen before rx, as Dr Drue Novel will need to know what he is treating.   Pt called back and explained to pt, will need ov pt agreed will be in tomrrow am Initial call taken by: Kandice Hams,  February 11, 2009 11:29 AM

## 2010-03-10 NOTE — Progress Notes (Signed)
Summary: Chest pain--ER recommended  Phone Note Call from Patient   Summary of Call: Patient left message on triage that she would like a call to discuss chest pain.  I spoke with the patient and she states that maybe she has pulled a muscle or something. She has had the chest pain x a couple of days and also hurts in her back and shoulder blades. She has HA, dizziness, and SOB. She denies swelling or tingling of the limbs and blurry vision. I advised the patient to go to the ER for cardiac eval b/c a pulled muscle would not produce her symptoms above. I made her aware that I would inform her MD and call to check on her tomorrow. Patient expressed understanding. Initial call taken by: Lucious Groves CMA,  January 07, 2010 3:11 PM  Follow-up for Phone Call        agree, check on her tomorrow Follow-up by: [facility] E. Joneric Streight MD,  January 07, 2010 3:55 PM  Additional Follow-up for Phone Call Additional follow up Details #1::        I called to check on the patient and she is in the hospital right now. She notes that all bloodwork has been negative, and they will be doing more testing. She notes that she did have abnormal EKG and will require additional testing. Patient is currently in room [phone], but is not aware of how long she will be staying. I made patient aware to call and schedule hosp f/u when d/c. Additional Follow-up by: Lucious Groves CMA,  January 08, 2010 8:57 AM

## 2010-03-10 NOTE — Procedures (Signed)
Summary: Colonoscopy  Colonoscopy   Imported By: Lowry Ram CMA 07/12/2008 16:19:12  _____________________________________________________________________  External Attachment:    Type:   Image     Comment:   External Document

## 2010-03-10 NOTE — Letter (Signed)
Summary: Patient [facility] Biopsy Results  [facility]  [address] [place], [place] [address]   Phone: [phone]  Fax: [phone]        November 30, 2009 MRN: [id number]    [facility] [address] [place], [place]  [address]    Dear Ms. Snarski,  I am pleased to inform you that the biopsies taken during your recent endoscopic examination did not show any evidence of cancer upon pathologic examination. The biopsies showed changes of acid reflux without Barrett's noted and benign gastric fundic polyps.  Continue with the treatment plan as outlined on the day of your      exam.  Please call us if you are having persistent problems or have questions about your condition that have not been fully answered at this time.  Sincerely,  Meryl Dare MD [facility]  This letter has been electronically signed by your physician.  Appended Document: Patient Notice-Endo Biopsy Results letter mailed

## 2010-03-10 NOTE — Progress Notes (Signed)
Summary: flu like symtom - dr Drue Novel  Phone Note Call from Patient Call back at [facility] Phone [phone]   Caller: Patient Summary of Call: patient is coughing, congested, sore throat chills started yesterday feels worse today Initial call taken by: Okey Regal [place],  January 09, 2008 11:27 AM  Follow-up for Phone Call        office visit in am ..Marland KitchenMarland KitchenShary Decamp  January 09, 2008 1:00 PM

## 2010-03-10 NOTE — Assessment & Plan Note (Signed)
Summary: 55month//tl    Vital Signs:  Patient Profile:   65 Years Old Female Weight:      235.8 pounds Pulse rate:   60 / minute BP sitting:   110 / 70  Vitals Entered By: Shary Decamp (April 18, 2007 10:59 AM)                 Chief Complaint:  rov - fasting; still with abd pain - started with diarrhea 2 weeks ago.  History of Present Illness: ROV still has abd pain Still has diarrhea on-off d/c PPI now on H2 blockers--GERD well control but still has pain and diarrhea    Prior Medications Reviewed Using: Patient Recall  Updated Prior Medication List: ATENOLOL 25 MG TABS (ATENOLOL) 1 by mouth once daily HYDROCHLOROTHIAZIDE 25 MG TABS (HYDROCHLOROTHIAZIDE) 1 by mouth once daily BENAZEPRIL HCL 10 MG  TABS (BENAZEPRIL HCL) 2 by mouth once daily TRAZODONE HCL 50 MG  TABS (TRAZODONE HCL) 2 by mouth qhs ROPINIROLE HCL 1 MG  TABS (ROPINIROLE HCL) 1 by mouth qhs PROTONIX 40 MG  TBEC (PANTOPRAZOLE SODIUM) qd FOSAMAX 70 MG  TABS (ALENDRONATE SODIUM) qwk TOPAMAX 100 MG  TABS (TOPIRAMATE) qd * ASA  * VITAMINS  HYDROCODONE-ACETAMINOPHEN 10-325 MG  TABS (HYDROCODONE-ACETAMINOPHEN) 2-4 QD AXERT 12.5 MG  TABS (ALMOTRIPTAN MALATE) PRN MECLIZINE HCL 12.5 MG  TABS (MECLIZINE HCL) PRN FLEXERIL 10 MG  TABS (CYCLOBENZAPRINE HCL) PRN RANITIDINE HCL 150 MG  TABS (RANITIDINE HCL) 1 by mouth two times a day ESTRATEST H.S. 0.625-1.25 MG  TABS (EST ESTROGENS-METHYLTEST)   Current Allergies (reviewed today): ! PCN     Review of Systems  GI      Denies bloody stools and vomiting.      occ nausea appetite ok   Physical Exam  General:     alert, well-developed, and overweight-appearing.   Lungs:     normal respiratory effort, no intercostal retractions, no accessory muscle use, and normal breath sounds.   Heart:     normal rate, regular rhythm, and no murmur.   Abdomen:     soft, no hepatomegaly, and no splenomegaly.  diffuse mild tenderness throughout    Impression &  Recommendations:  Problem # 1:  ABDOMINAL PAIN, GENERALIZED (ICD-789.07) Cscope in 2008 Recent enterogram unrevealing CBC LFTs 1-09 normal hold fosamax x 4 to 6 weeks (s/e??) Re refer to GI  (has no appointment w/ them so far) Orders: TLB-Amylase (82150-AMYL) TLB-Lipase (83690-LIPASE) Gastroenterology Referral (GI)   Problem # 2:  HYPERTENSION (ICD-401.9)  Her updated medication list for this problem includes:    Atenolol 25 Mg Tabs (Atenolol) .[name]... 1 by mouth once daily    Hydrochlorothiazide 25 Mg Tabs (Hydrochlorothiazide) .[name]... 1 by mouth once daily    Benazepril Hcl 10 Mg Tabs (Benazepril hcl) .[name]... 2 by mouth once daily  BP today: 110/70 Prior BP: 110/80 (03/21/2007)  Labs Reviewed: Creat: 1.0 (03/07/2007)   Problem # 3:  HYPERLIPIDEMIA (ICD-272.4) not on meds never try meds Labs Reviewed: SGOT: 17 (03/07/2007)   SGPT: 14 (03/07/2007)  Orders: TLB-Lipid Panel (80061-LIPID)   Complete Medication List: 1)  Atenolol 25 Mg Tabs (Atenolol) .[name].. 1 by mouth once daily 2)  Hydrochlorothiazide 25 Mg Tabs (Hydrochlorothiazide) .[name].. 1 by mouth once daily 3)  Benazepril Hcl 10 Mg Tabs (Benazepril hcl) .... 2 by mouth once daily 4)  Trazodone Hcl 50 Mg Tabs (Trazodone hcl) .... 2 by mouth qhs 5)  Ropinirole Hcl 1 Mg Tabs (Ropinirole hcl) .[name].. 1 by mouth  qhs 6)  Fosamax 70 Mg Tabs (Alendronate sodium) .... Qwk 7)  Topamax 100 Mg Tabs (Topiramate) .... Qd 8)  Asa  9)  Vitamins  10)  Hydrocodone-acetaminophen 10-325 Mg Tabs (Hydrocodone-acetaminophen) .... 2-4 qd 11)  Axert 12.5 Mg Tabs (Almotriptan malate) .... Prn 12)  Meclizine Hcl 12.5 Mg Tabs (Meclizine hcl) .... Prn 13)  Flexeril 10 Mg Tabs (Cyclobenzaprine hcl) .... Prn 14)  Ranitidine Hcl 150 Mg Tabs (Ranitidine hcl) .[name].. 1 by mouth two times a day 15)  Estratest H.s. 0.625-1.25 Mg Tabs (Est estrogens-methyltest)   Patient Instructions: 1)  hold Fosamax x 4 to 6 weeks 2)  Please schedule a follow-up appointment  in 3 months.    ]

## 2010-03-10 NOTE — Letter (Signed)
Summary: Primary Care Consult Scheduled Letter  [facility]  [address] [place], [place] [address]   Phone: [phone]  Fax: [phone]      03/30/2007 MRN: [id number]  [facility] [address] [place], [place]  [address]    Dear Ms. Tani,      We have scheduled an appointment for you.  At the recommendation of Dr.Paz, we have scheduled you a consult with [facility] for CT Scan [address] on 02.25.09 @ 8:30 a.m.  Their phone number is [phone].  If this appointment day and time is not convenient for you, please feel free to call the office of the doctor you are being referred to at the number listed above and reschedule the appointment.  ***NOTHING TO EAT OR DRINK 4 HOURS PRIOR            TO APPT TIME****    It is important for you to keep your scheduled appointments. We are here to make sure you are given good patient care. If yu have questions or you have made changes to your appointment, please notify us at  [phone], ask for [facility].    Thank you,  Patient Care Coordinator  at [facility]

## 2010-03-10 NOTE — Assessment & Plan Note (Signed)
Summary: CPX/NS/KDC   Vital Signs:  Patient profile:   65 year old female Height:      65 inches Weight:      233.25 pounds BMI:     38.96 Pulse rate:   50 / minute BP sitting:   110 / 60  Vitals Entered By: Kandice Hams (April 01, 2009 12:38 PM) CC: CPX   History of Present Illness: complains of constipation for 6 weeks she used to have two to  3 BMs   a day, now it may be  two weeks before she has a BM. BMs  are associated with anal pain and  drops of blood in  the toilet paper no recent change on her diet  the only new medication that she is taking his Celebrex as needed she has been taking hydrocodone for long time  she also complained of feet numbness, on and off, day or  night. She has chronic back pain, no burning type of feeling in her feet  Hypertension-- ambulatory BPs  good ,120s , sometimes even lower   Osteoporosis-- last dexa @ortho , quit fosamax , has not taken reclast "still thinking about"  Depression-- good days and bad days   MENOPAUSE, SURGICAL -- going to see gyn 3-11   yearly checkup, chart reviewed  Allergies: 1)  ! Pcn 2)  ! * Mucinex Dm 3)  ! Morphine  Past History:  Past Medical History: Hyperlipidemia Hypertension Osteoporosis MIGRAINE HEADACHE -- topamax  Hiatal hernia Depression (-) cath 12-2002 GERD, GASTRITIS, ESOPHAGITIS , BARRETTS ESOPHAGUS-- EGD 05-2007 PERSISTENT DISORDER INITIATING/MAINTAINING SLEEP   RLS BACK PAIN, CHRONIC  MENOPAUSE, SURGICAL   Past Surgical History: Reviewed history from 07/15/2008 and no changes required. Hysterectomy for endometriosis Oophorectomy L5-S1 anterior fusion Bilateral knee arthroscopies Right-shoulder surgery Left-arm surgery Bilateral foot surgeries  Family History: colon ca-- GF,2 uncles breast ca--sister MI--F  age: late?  Esophageal Cancer: Brother Diabetes--  Aunt    Social History: not married live by  herself. Has a son Patient is a former smoker. -stopped 30  years ago Alcohol Use - no Daily Caffeine Use-2 cups daily Illicit Drug Use - no Patient gets little regular exercise  Review of Systems General:  Denies fever and weight loss. CV:  Denies chest pain or discomfort and swelling of feet. Resp:  Denies cough and shortness of breath. GI:  Denies nausea and vomiting. GU:  no vag   bleed . Psych:  sleeps ok .  Physical Exam  General:  alert, well-developed, and overweight-appearing.   Neck:  no masses and no thyromegaly.   Lungs:  normal respiratory effort, no intercostal retractions, no accessory muscle use, and normal breath sounds.   Heart:  normal rate, regular rhythm, no murmur, and no gallop.   Abdomen:  soft, non-tender, no distention, no masses, no guarding, and no rigidity.   Rectal:  declined  Extremities:  no edema Psych:  not anxious appearing and not depressed appearing.     Impression & Recommendations:  Problem # 1:  CONSTIPATION (ICD-564.00)  constipation for 6 weeks, no new medications  except for Celebrex which she takes p.r.n. last colonoscopy in 2008 would not let me do a anoscopy labs Metamucil and MiraLax patient to call if not better for a GI referral because this is a change in her pattern of BMs is unlikely that Celebrex is causing the problem ,  however because is the only new medication she is taking I will ask her to hold it  Orders: TLB-TSH (Thyroid Stimulating Hormone) [phone])  Problem # 2:  HEALTH SCREENING (ICD-V70.0) chart reviewed Td 2000-- declined booster today  did not get a Flu shot   colonoscopy UTD (03-2006, next 2013)  plans to see gyn 3-11, to have a female exam and a MMG : strongly recommend her to go  counseled about diet and exercise, printed material provided  Problem # 3:  PARESTHESIA (ICD-782.0)  lower extremity paresthesias normal ABIs few months ago labs  Orders: TLB-TSH (Thyroid Stimulating Hormone) (84443-TSH) TLB-B12 + Folate Pnl  (29562_13086-V78/ION)  Problem # 4:  HYPERTENSION (ICD-401.9)  at goal  Her updated medication list for this problem includes:    Atenolol 25 Mg Tabs (Atenolol) .[name]... 1 by mouth once daily    Hydrochlorothiazide 25 Mg Tabs (Hydrochlorothiazide) .[name]... 1 by mouth once daily    Benazepril Hcl 10 Mg Tabs (Benazepril hcl) .[name]... 2 by mouth once daily    BP today: 110/60 Prior BP: 118/80 (02/12/2009)  Labs Reviewed: K+: 3.7 (12/11/2007) Creat: : 1.1 (08/04/2008)   Chol: 222 (08/04/2008)   HDL: 51 (08/04/2008)   LDL: 144 (08/04/2008)   TG: 133 (08/04/2008)  Orders: Venipuncture (62952) TLB-BMP (Basic Metabolic Panel-BMET) (80048-METABOL) TLB-CBC Platelet - w/Differential (85025-CBCD)  Problem # 5:  HYPERLIPIDEMIA (ICD-272.4) diet  only  Labs Reviewed: SGOT: 16 (08/04/2008)   SGPT: 11 (08/04/2008)   HDL:51 (08/04/2008), 40.3 (04/09/2008)  LDL:144 (08/04/2008), DEL (04/09/2008)  Chol:222 (08/04/2008), 239 (04/09/2008)  Trig:133 (08/04/2008), 115 (04/09/2008)  Orders: TLB-ALT (SGPT) (84460-ALT) TLB-AST (SGOT) (84450-SGOT) TLB-Lipid Panel (80061-LIPID)  Problem # 6:  MIGRAINE HEADACHE (ICD-346.90) on Topamax Her updated medication list for this problem includes:    Atenolol 25 Mg Tabs (Atenolol) .[name]... 1 by mouth once daily    Aspirin 81 Mg Tbec (Aspirin) .[name]... Take 1 tablet by mouth once a day    Hydrocodone-acetaminophen 7.5-500 Mg Tabs (Hydrocodone-acetaminophen) .[name]... Take 2-4 tablets by mouth  Problem # 7:  PERSISTENT DISORDER INITIATING/MAINTAINING SLEEP (ICD-307.42) symptoms well controlled at present  Problem # 8:  OSTEOPOROSIS (ICD-733.00)  last dexa @ortho , she quit fosamax , has not taken reclast "still thinking about" plan: Will try again to get the  dexa  report The following medications were removed from the medication list:    Fosamax 70 Mg Tabs (Alendronate sodium) .[name]... 1 by mouth qwk  Complete Medication List: 1)  Atenolol 25 Mg Tabs (Atenolol) .[name].. 1 by  mouth once daily 2)  Hydrochlorothiazide 25 Mg Tabs (Hydrochlorothiazide) .[name].. 1 by mouth once daily 3)  Benazepril Hcl 10 Mg Tabs (Benazepril hcl) .... 2 by mouth once daily 4)  Trazodone Hcl 50 Mg Tabs (Trazodone hcl) .... 2 by mouth qhs 5)  Ropinirole Hcl 1 Mg Tabs (Ropinirole hcl) .[name].. 1 by mouth qhs 6)  Topamax 200 Mg Tabs (Topiramate) .... 2 by mouth at bedtime 7)  Kapidex 60 Mg Cpdr (Dexlansoprazole) .... Take one capsule by mouth 30 minutes before breakfast 8)  Aspirin 81 Mg Tbec (Aspirin) .... Take 1 tablet by mouth once a day 9)  Multivitamins Tabs (Multiple vitamin) .... Take 1 tablet by mouth once a day 10)  Hydrocodone-acetaminophen 7.5-500 Mg Tabs (Hydrocodone-acetaminophen) .... Take 2-4 tablets by mouth 11)  Flexeril 10 Mg Tabs (Cyclobenzaprine hcl) .... Take 1 tablet by mouth as needed 12)  Vitamin D 1800 Mg Tablet  .... Take 1 1000 mg and 2 400 mg tablets  Patient Instructions: 1)  Metamucil two capsules daily with fluids  (OTC) 2)  MiraLax 17 g daily  (  OTC) 3)  hold Celebrex for 3 weeks 4)  call in 4 weeks if you are not better   regards constipation 5)  Please schedule a follow-up appointment in 6 months .

## 2010-03-10 NOTE — Progress Notes (Signed)
Summary: appt  Phone Note Outgoing Call   Summary of Call: Pt needs a 6 month f/u with Dr.Paz. Army Fossa CMA  September 22, 2009 8:11 AM   Follow-up for Phone Call        pt has an appt 09/29/09.Karoline Caldwell Negrete  September 22, 2009 11:25 AM

## 2010-03-10 NOTE — Progress Notes (Signed)
Summary: DEXA  Phone Note Outgoing Call   Summary of Call: we go a fax from [facility] regards DEXA,   the document is very light, I have a hard time  reading it ,  unable to scan but it seems like she had a DEXA 04-25-08 , a T score was - 2.0 plan :  when the patient come back to the office, will ask her to get hardcopy of all the bone density tests [name] E. Kumar Falwell MD  Jun 13, 2009 10:39 AM

## 2010-03-10 NOTE — Progress Notes (Signed)
Summary: CT results   Phone Note Outgoing Call   Details for Reason: **SEE APPEND-CT RESULTS**  Summary of Call: Pt aware of CT results order for CT enterography done ..................................................................Marland KitchenShary Decamp  March 30, 2007 2:24 PM

## 2010-03-10 NOTE — Letter (Signed)
[facility]   Imported By: Lanelle Bal 01/11/2008 12:29:51  _____________________________________________________________________  External Attachment:    Type:   Image     Comment:   External Document

## 2010-03-10 NOTE — Progress Notes (Signed)
Summary: [facility]---RX  Phone Note Refill Request   Refills Requested: Medication #1:  ERGOCALCIFEROL 50000 UNIT CAPS 1 by mouth qwk. [facility]--PH-[phone]  Initial call taken by: Freddy Jaksch,  May 20, 2008 10:23 AM  Follow-up for Phone Call        spoke withpharmacist informed pt was on only for 12 weeks then using otc per lab. pt requested last month on 04/3008 to be taken off auto refill ..sign Follow-up by: Kandice Hams,  May 20, 2008 12:39 PM

## 2010-03-10 NOTE — Progress Notes (Signed)
Summary: PRIOR AUTH,,NOT NEEDED FOR BENAZEPRIL   Phone Note From Pharmacy   Summary of Call: RECD A PRIOR AUTHORIZATION FROM [facility]....FOR BENAZEPRIL HCL...WRONG INS WAS SUBMITTED--THEY WERE ABLE TO PUT THE MEDICATION ( BENAZEPRIL) BACK THRU.Marland KitchenMarland KitchenNO PROBLEM.Marland KitchenDaine Gip  November 09, 2007 1:43 PM Initial call taken by: Daine Gip,  November 09, 2007 1:43 PM

## 2010-03-10 NOTE — Assessment & Plan Note (Signed)
Summary: CONGESTION IN CHEST/COUGHING/KDC   Vital Signs:  Patient profile:   65 year old female Height:      65 inches Weight:      231 pounds Temp:     99.1 degrees F BP sitting:   118 / 80  Vitals Entered By: Shary Decamp (February 12, 2009 10:02 AM) CC: acute only Comments  - not feeling well x yest  - non-prod cough  - nasal congestion, teeth hurt  - diarrhea Shary Decamp  February 12, 2009 10:08 AM    History of Present Illness: CC: acute only sx started few days ago, sx a lot worse x 1 day   - non-prod cough, cough very persistent   - nasal congestion, teeth hurt  - diarrhea, loose or watery, several episodes up until yesterday, self started imodium  Current Medications (verified): 1)  Atenolol 25 Mg Tabs (Atenolol) .[name].. 1 By Mouth Once Daily 2)  Hydrochlorothiazide 25 Mg Tabs (Hydrochlorothiazide) .[name].. 1 By Mouth Once Daily 3)  Benazepril Hcl 10 Mg  Tabs (Benazepril Hcl) .... 2 By Mouth Once Daily 4)  Trazodone Hcl 50 Mg  Tabs (Trazodone Hcl) .... 2 By Mouth Qhs 5)  Ropinirole Hcl 1 Mg  Tabs (Ropinirole Hcl) .[name].. 1 By Mouth Qhs 6)  Topamax 200 Mg Tabs (Topiramate) .... 2 By Mouth At Bedtime 7)  Kapidex 60 Mg  Cpdr (Dexlansoprazole) .... Take One Capsule By Mouth 30 Minutes Before Breakfast 8)  Aspirin 81 Mg Tbec (Aspirin) .... Take 1 Tablet By Mouth Once A Day 9)  Multivitamins  Tabs (Multiple Vitamin) .... Take 1 Tablet By Mouth Once A Day 10)  Hydrocodone-Acetaminophen 7.5-500 Mg Tabs (Hydrocodone-Acetaminophen) .... Take 2-4 Tablets By Mouth 11)  Flexeril 10 Mg  Tabs (Cyclobenzaprine Hcl) .... Take 1 Tablet By Mouth As Needed 12)  Fosamax 70 Mg Tabs (Alendronate Sodium) .[name].. 1 By Mouth Qwk 13)  Vitamin D 1800 Mg Tablet .... Take 1 1000 Mg and 2 400 Mg Tablets  Allergies (verified): 1)  ! Pcn 2)  ! * Mucinex Dm 3)  ! Morphine  Past History:  Past Medical History: Hyperlipidemia Hypertension Osteoporosis MIGRAINE HEADACHE  Hiatal hernia Depression (-)  cath 12-2002 GERD, GASTRITIS, ESOPHAGITIS , BARRETTS ESOPHAGUS-- EGD 05-2007 PERSISTENT DISORDER INITIATING/MAINTAINING SLEEP   RLS BACK PAIN, CHRONIC  MENOPAUSE, SURGICAL   Past Surgical History: Reviewed history from 07/15/2008 and no changes required. Hysterectomy for endometriosis Oophorectomy L5-S1 anterior fusion Bilateral knee arthroscopies Right-shoulder surgery Left-arm surgery Bilateral foot surgeries  Social History: Reviewed history from 10/21/2008 and no changes required. not married live by  herself. Has a son Patient is a former smoker. -stopped 30 years ago Alcohol Use - no Daily Caffeine Use-2 cups daily Illicit Drug Use - no Patient gets regular exercise.  Review of Systems General:  (+) subjective F/C. GI:  Denies abdominal pain and bloody stools; mild nausea . MS:  (+) aches all over .  Physical Exam  General:  alert and well-developed.  nontoxic Ears:  R ear normal and L ear normal.   Nose:  slightly congested Mouth:  no redness or discharge Lungs:  normal respiratory effort, no intercostal retractions, normal breath sounds, and no crackles.   Heart:  normal rate, regular rhythm, no murmur, and no gallop.     Impression & Recommendations:  Problem # 1:  VIRAL INFECTION-UNSPEC (ICD-079.99) symptom consistent with a viral syndrome, see instructions  If not better consider antibiotics addendum: Rx for hydrocodone syrup destroyed, she  will use her regular Vicodin for cough as well The following medications were removed from the medication list:    Hydrocodone-homatropine 5-1.5 Mg/81ml Syrp (Hydrocodone-homatropine) .[name]... 1 teaspoon every 4 hours as needed for cough Her updated medication list for this problem includes:    Aspirin 81 Mg Tbec (Aspirin) .[name]... Take 1 tablet by mouth once a day  Complete Medication List: 1)  Atenolol 25 Mg Tabs (Atenolol) .[name].. 1 by mouth once daily 2)  Hydrochlorothiazide 25 Mg Tabs (Hydrochlorothiazide) .[name].. 1 by  mouth once daily 3)  Benazepril Hcl 10 Mg Tabs (Benazepril hcl) .... 2 by mouth once daily 4)  Trazodone Hcl 50 Mg Tabs (Trazodone hcl) .... 2 by mouth qhs 5)  Ropinirole Hcl 1 Mg Tabs (Ropinirole hcl) .[name].. 1 by mouth qhs 6)  Topamax 200 Mg Tabs (Topiramate) .... 2 by mouth at bedtime 7)  Kapidex 60 Mg Cpdr (Dexlansoprazole) .... Take one capsule by mouth 30 minutes before breakfast 8)  Aspirin 81 Mg Tbec (Aspirin) .... Take 1 tablet by mouth once a day 9)  Multivitamins Tabs (Multiple vitamin) .... Take 1 tablet by mouth once a day 10)  Hydrocodone-acetaminophen 7.5-500 Mg Tabs (Hydrocodone-acetaminophen) .... Take 2-4 tablets by mouth 11)  Flexeril 10 Mg Tabs (Cyclobenzaprine hcl) .... Take 1 tablet by mouth as needed 12)  Fosamax 70 Mg Tabs (Alendronate sodium) .[name].. 1 by mouth qwk 13)  Vitamin D 1800 Mg Tablet  .... Take 1 1000 mg and 2 400 mg tablets  Patient Instructions: 1)  rest, fluids, Tylenol, 650 mg every 4 hours as needed for pain. 2)  Robitussin-DM as needed for cough. 3)  If the cough is severe, use hydrocodone which also helps with cough 4)  call any time if you feel a lot worse, have high fever or you are short of breath 5)  Call if you are not better in a few days Prescriptions: HYDROCODONE-HOMATROPINE 5-1.5 MG/5ML SYRP (HYDROCODONE-HOMATROPINE) 1 teaspoon every 4 hours as needed for cough  #200cc x 0   Entered and Authorized by:   Nolon Rod. Ambriella Kitt MD   Signed by:   Nolon Rod. Stclair Szymborski MD on 02/12/2009   Method used:   Print then Give to Patient   RxID:   [id number]

## 2010-03-10 NOTE — Assessment & Plan Note (Signed)
**[name] De-Identified via Obfuscation** Summary: Alexa refills--ch.    History of Present Illness Visit Type: Follow-up Visit Primary GI MD: Elie Goody MD [facility] Primary Provider: Willow Ora, MD Chief Complaint: [name] here for refills of Dexilant. She states that her reflux is doing okay since taking medication. However, she has been having 3 months constipation with small amounts of brbpr more noticable after straining to have a BM. She also has some occasional generalized abdominal pain. History of Present Illness:   This is a 65 year old female, who returns today complaining of upper abdominal pain that occasionally generalizes. She has had a substantial change in bowel habits over the past few months. She previously had frequent diarrhea, Alexa now she is having significant constipation associated with straining. She states she often does not have a bowel movement for 2-3 weeks at a time.  She has noticed a small amount of bright red rectal bleeding when straining with bowel movement. She has a strong family history of colon cancer underwent colonoscopy in February 2008 which showed a small submucosal lesion in the descending colon, consistent with a lipoma.  She has a history of Barrett's esophagus Alexa reflux symptoms are well controlled on Dexilant.   GI Review of Systems    Reports abdominal pain.     Location of  Abdominal pain: generalized.    Denies acid reflux, belching, bloating, chest pain, dysphagia with liquids, dysphagia with solids, heartburn, loss of appetite, nausea, vomiting, vomiting blood, weight loss, Alexa  weight gain.      Reports change in bowel habits, constipation, rectal bleeding, Alexa  rectal pain.     Denies anal fissure, black tarry stools, diarrhea, diverticulosis, fecal incontinence, heme positive stool, hemorrhoids, irritable bowel syndrome, jaundice, light color stool, Alexa  liver problems.   Current Medications (verified): 1)  Atenolol 25 Mg Tabs (Atenolol) .[name].. 1 By Mouth Once Daily 2)   Hydrochlorothiazide 25 Mg Tabs (Hydrochlorothiazide) .[name].. 1 By Mouth Once Daily 3)  Benazepril Hcl 10 Mg  Tabs (Benazepril Hcl) .... 2 By Mouth Once Daily 4)  Trazodone Hcl 50 Mg  Tabs (Trazodone Hcl) .... 2 By Mouth Qhs 5)  Ropinirole Hcl 1 Mg  Tabs (Ropinirole Hcl) .... Take 1 Tab By Mouth At Dinner Alexa 1/2 Tab By Mouth At Bedtime 6)  Topamax 200 Mg Tabs (Topiramate) .... 2 By Mouth At Bedtime 7)  Dexilant 60 Mg Cpdr (Dexlansoprazole) .... One Capsule By Mouth Once Daily 8)  Aspirin 81 Mg Tbec (Aspirin) .... Take 1 Tablet By Mouth Once A Day 9)  Multivitamins  Tabs (Multiple Vitamin) .... Take 1 Tablet By Mouth Once A Day 10)  Hydrocodone-Acetaminophen 7.5-500 Mg Tabs (Hydrocodone-Acetaminophen) .... Take 2-4 Tablets By Mouth 11)  Flexeril 10 Mg  Tabs (Cyclobenzaprine Hcl) .... Take 1 Tablet By Mouth As Needed 12)  Vitamin D 1800 Mg Tablet .... Take 1 1000 Mg Alexa 2 400 Mg Tablets 13)  Estrace 1 Mg Tabs (Estradiol) .[name].. 1 By Mouth Daily 14)  B Complex  Tabs (B Complex Vitamins) .... Take 1 Tablet By Mouth Once A Day  Allergies (verified): 1)  ! Pcn 2)  ! * Mucinex Dm 3)  ! Morphine  Past History:  Past Medical History: Hyperlipidemia Hypertension Osteoporosis MIGRAINE HEADACHE - topamax  Hiatal hernia Depression (-) cath 12-2002 GERD GASTRITIS ESOPHAGITIS BARRETTS ESOPHAGUS no dysplasia, 05/2007 PERSISTENT DISORDER INITIATING/MAINTAINING SLEEP   RLS BACK PAIN, CHRONIC  MENOPAUSE, SURGICAL   Past Surgical History: Reviewed history from 07/15/2008 Alexa no changes required. Hysterectomy for endometriosis  Oophorectomy L5-S1 anterior fusion Bilateral knee arthroscopies Right-shoulder surgery Left-arm surgery Bilateral foot surgeries  Family History: FH colon ca-- maternal GF, 2 maternal uncles breast ca--sister MI--F  age: late?  Esophageal Cancer: Brother Diabetes--  Aunt Spleen CA-Uncle    Social History: not married live by  herself. Has a son [name] is a  former smoker. -stopped 30 years ago Alcohol Use -yes-occasional Daily Caffeine Use-1 week Illicit Drug Use - no [name] gets little regular exercise  Review of Systems       The [name] complains of allergy/sinus, arthritis/joint pain, back pain, fatigue, hearing problems, swelling of feet/legs, Alexa voice change.         The pertinent positives Alexa negatives are noted as above Alexa in the HPI. All other ROS were reviewed Alexa were negative.   Vital Signs:  [name] profile:   65 year old female Height:      65 inches Weight:      230.25 pounds BMI:     38.45 BSA:     2.10 Pulse rate:   72 / minute Pulse rhythm:   regular BP sitting:   108 / 70  (right arm) Cuff size:   large  Vitals Entered By: Lamona Curl CMA Duncan Dull) (November 03, 2009 2:53 PM)  Physical Exam  General:  Well developed, well nourished, no acute distress. Head:  Normocephalic Alexa atraumatic. Eyes:  PERRLA, no icterus. Mouth:  No deformity or lesions, dentition normal. Lungs:  Clear throughout to auscultation. Heart:  Regular rate Alexa rhythm; no murmurs, rubs,  or bruits. Abdomen:  Soft, nontender Alexa nondistended. No masses, hepatosplenomegaly or hernias noted. Normal bowel sounds. Psych:  Alert Alexa cooperative. Normal mood Alexa affect.  Impression & Recommendations:  Problem # 1:  CHANGE IN BOWELS (ICD-787.99) Substantial change in bowel habits over the past few months with morbid constipation. Begin a high fiber diet, increase water intake, Alexa begin MiraLax once or twice daily. Rule out colonic lesions. The risks, benefits Alexa alternatives to colonoscopy with possible biopsy Alexa possible polypectomy were discussed with the [name] Alexa they consent to proceed. The procedure will be scheduled electively. Orders: Colon/Endo (Colon/Endo)  Problem # 2:  CONSTIPATION (ICD-564.00) See problem #1.  Problem # 3:  BARRETTS ESOPHAGUS (ICD-530.85) Barrett's esophagus. Continue Dexilant 60 mg q.a.m.  Schedule surveillance endoscopy at time of colonoscopy. Orders: Colon/Endo (Colon/Endo)  Problem # 4:  FAMILY HX COLON CANCER (ICD-V16.0) Strong family history of colon cancer: maternal grandfather Alexa 2 maternal uncles.  [name] Instructions: 1)  Start Miralax 17 grams in 8oz of water 1-2 x daily. 2)  Dexilant has been sent to your pharmacy.  3)  Colonoscopy brochure given.  4)  Upper Endoscopy brochure given.  5)  High Fiber, Low Fat  Healthy Eating Plan brochure given.  6)  Copy sent to : Willow Ora, MD 7)  The medication list was reviewed Alexa reconciled.  All changed / newly prescribed medications were explained.  A complete medication list was provided to the [name] / caregiver.  Prescriptions: MOVIPREP 100 GM  SOLR (PEG-KCL-NACL-NASULF-NA ASC-C) As per prep instructions.  #1 x 0   Entered by:   Christie Nottingham CMA (AAMA)   Authorized by:   Meryl Dare MD [facility]   Signed by:   Christie Nottingham CMA (AAMA) on 11/03/2009   Method used:   [facility]  [address] [id number]       [address]  [place], [place]  [address]       Ph: [phone] or [phone]       Fax: [phone]   RxID:   [id number] DEXILANT 60 MG CPDR (DEXLANSOPRAZOLE) one capsule by mouth once daily  #30 x 11   Entered by:   Christie Nottingham CMA (AAMA)   Authorized by:   Meryl Dare MD [facility]   Signed by:   Christie Nottingham CMA (AAMA) on 11/03/2009   Method used:   [facility]        [facility]  [address] [phone][facility]ail[facility]       [address]       [place], [place]  [address]       Ph: [phone] or [phone]       Fax: [phone]   RxID:   [id number]

## 2010-03-10 NOTE — Miscellaneous (Signed)
Summary: Rx for Kapidex  Clinical Lists Changes  Medications: Added new medication of KAPIDEX 60 MG  CPDR (DEXLANSOPRAZOLE) Take one capsule by mouth 30 minutes before breakfast - Signed Rx of KAPIDEX 60 MG  CPDR (DEXLANSOPRAZOLE) Take one capsule by mouth 30 minutes before breakfast;  #30 x 11;  Signed;  Entered by: Christie Nottingham CMA;  Authorized by: Meryl Dare MD;  Method used: Electronic    Prescriptions: KAPIDEX 60 MG  CPDR (DEXLANSOPRAZOLE) Take one capsule by mouth 30 minutes before breakfast  #30 x 11   Entered by:   Christie Nottingham CMA   Authorized by:   Meryl Dare MD   Signed by:   Christie Nottingham CMA on 06/15/2007   Method used:   Electronically sent to ...       [facility] [address]       [address] [place], [place]  [address]       Ph: [phone]       Fax: [phone]   RxID:   [id number]

## 2010-03-10 NOTE — Letter (Signed)
Summary: [facility]  [facility]   Imported By: Lanelle Bal 12/31/2008 11:58:16  _____________________________________________________________________  External Attachment:    Type:   Image     Comment:   External Document

## 2010-03-10 NOTE — Procedures (Signed)
Summary: EGD  EGD   Imported By: Lowry Ram CMA 07/12/2008 16:19:42  _____________________________________________________________________  External Attachment:    Type:   Image     Comment:   External Document

## 2010-03-10 NOTE — Assessment & Plan Note (Signed)
Summary: RO4 IN 4 MONTH //PH   Vital Signs:  Patient Profile:   65 Years Old Female Weight:      227 pounds Pulse rate:   72 / minute BP sitting:   124 / 80  Vitals Entered By: Shary Decamp (April 09, 2008 11:13 AM)                 Referred by:  Russella Dar PCP:  Jahson Emanuele  Chief Complaint:  rov - did not get actonel filled $$ -- had fosamax at home .  History of Present Illness: ROV Hyperlipidemia-- fasting today diarrhea x 2 days: watery, some nausea in AM Osteoporosis-- did not get actonel filled $$ -- had fosamax at home  also c/o a long h/o tinnitus , R>L, non pulsatile , no decreased hearing     Updated Prior Medication List: ESTRATEST H.S. 0.625-1.25 MG  TABS (EST ESTROGENS-METHYLTEST) Take 1 tablet by mouth once a day ATENOLOL 25 MG TABS (ATENOLOL) 1 by mouth once daily HYDROCHLOROTHIAZIDE 25 MG TABS (HYDROCHLOROTHIAZIDE) 1 by mouth once daily BENAZEPRIL HCL 10 MG  TABS (BENAZEPRIL HCL) 2 by mouth once daily TRAZODONE HCL 50 MG  TABS (TRAZODONE HCL) 2 by mouth qhs ROPINIROLE HCL 1 MG  TABS (ROPINIROLE HCL) 1 by mouth qhs TOPAMAX 100 MG  TABS (TOPIRAMATE) 1 1/2 by mouth qd KAPIDEX 60 MG  CPDR (DEXLANSOPRAZOLE) Take one capsule by mouth 30 minutes before breakfast * ASA  * VITAMINS  HYDROCODONE-ACETAMINOPHEN 10-325 MG  TABS (HYDROCODONE-ACETAMINOPHEN) 2-4 QD AXERT 12.5 MG  TABS (ALMOTRIPTAN MALATE) PRN FLEXERIL 10 MG  TABS (CYCLOBENZAPRINE HCL) PRN FOSAMAX 70 MG TABS (ALENDRONATE SODIUM) 1 by mouth qwk  Current Allergies (reviewed today): ! PCN ! Arloa Koh DM  Past Medical History:    Reviewed history from 12/11/2007 and no changes required:       Hyperlipidemia       Hypertension       Osteoporosis       (-) cath 12-2002       GASTRITIS, ESOPHAGITIS , BARRETTS ESOPHAGUS-- EGD 05-2007       PERSISTENT DISORDER INITIATING/MAINTAINING SLEEP (ICD-307.42)       RESTLESS LEGS SYNDROME (ICD-333.94)       BACK PAIN, CHRONIC (ICD-724.5)       MENOPAUSE, SURGICAL  (ICD-627.4)       MIGRAINE HEADACHE      Review of Systems  General      Denies fatigue and weight loss.  CV      Denies chest pain or discomfort and swelling of feet.  Resp      Denies shortness of breath.  GI      Denies abdominal pain, bloody stools, and vomiting.   Physical Exam  General:     alert and well-developed.   Ears:     R ear normal and L ear normal.   hearing normal B  Lungs:     normal respiratory effort, no intercostal retractions, no accessory muscle use, and normal breath sounds.   Heart:     normal rate, regular rhythm, and no murmur.      Impression & Recommendations:  Problem # 1:  OSTEOPOROSIS (ICD-733.00) due to cost, will stay on fosamax, doing better w/  compliance  on Vit D once daily  Reclast? info provided, she will call if interested  The following medications were removed from the medication list:    Actonel 150 Mg Tabs (Risedronate sodium) .[name]... 1 by mouth once a month  Her updated medication list for this problem includes:    Fosamax 70 Mg Tabs (Alendronate sodium) .[name]... 1 by mouth qwk  Orders: Venipuncture ([address]) T-Vitamin D (25-Hydroxy) (47425-95638)   Problem # 2:  HYPERLIPIDEMIA (ICD-272.4)  Labs Reviewed: Chol: 177 (04/18/2007)   HDL: 28.6 (04/18/2007)   LDL: 123 (04/18/2007)   TG: 128 (04/18/2007) SGOT: 18 (12/11/2007)   SGPT: 13 (12/11/2007)  Orders: TLB-Lipid Panel (80061-LIPID)   Problem # 3:  HYPERTENSION (ICD-401.9) at goal  Her updated medication list for this problem includes:    Atenolol 25 Mg Tabs (Atenolol) .[name]... 1 by mouth once daily    Hydrochlorothiazide 25 Mg Tabs (Hydrochlorothiazide) .[name]... 1 by mouth once daily    Benazepril Hcl 10 Mg Tabs (Benazepril hcl) .[name]... 2 by mouth once daily  BP today: 124/80 Prior BP: 112/76 (02/22/2008)  Labs Reviewed: Creat: 1.0 (12/11/2007) Chol: 177 (04/18/2007)   HDL: 28.6 (04/18/2007)   LDL: 123 (04/18/2007)   TG: 128 (04/18/2007)   Problem # 4:   TINNITUS (ICD-388.30) Assessment: New observe,  callif symptoms increaseor decrease hearing   Problem # 5:  DIARRHEA symptoms x 2 days,observe,call if no better   Complete Medication List: 1)  Estratest H.s. 0.625-1.25 Mg Tabs (Est estrogens-methyltest) .... Take 1 tablet by mouth once a day 2)  Atenolol 25 Mg Tabs (Atenolol) .[name].. 1 by mouth once daily 3)  Hydrochlorothiazide 25 Mg Tabs (Hydrochlorothiazide) .[name].. 1 by mouth once daily 4)  Benazepril Hcl 10 Mg Tabs (Benazepril hcl) .... 2 by mouth once daily 5)  Trazodone Hcl 50 Mg Tabs (Trazodone hcl) .... 2 by mouth qhs 6)  Ropinirole Hcl 1 Mg Tabs (Ropinirole hcl) .[name].. 1 by mouth qhs 7)  Topamax 100 Mg Tabs (Topiramate) .[name].. 1 1/2 by mouth qd 8)  Kapidex 60 Mg Cpdr (Dexlansoprazole) .... Take one capsule by mouth 30 minutes before breakfast 9)  Asa  10)  Vitamins  11)  Hydrocodone-acetaminophen 10-325 Mg Tabs (Hydrocodone-acetaminophen) .... 2-4 qd 12)  Axert 12.5 Mg Tabs (Almotriptan malate) .... Prn 13)  Flexeril 10 Mg Tabs (Cyclobenzaprine hcl) .... Prn 14)  Fosamax 70 Mg Tabs (Alendronate sodium) .[name].. 1 by mouth qwk   Patient Instructions: 1)  Please schedule a follow-up appointment in 6 months.   Prescriptions: FOSAMAX 70 MG TABS (ALENDRONATE SODIUM) 1 by mouth qwk  #4 x 6   Entered by:   Shary Decamp   Authorized by:   Nolon Rod. Aisha Greenberger MD   Signed by:   Shary Decamp on 04/09/2008   Method used:   Electronically to        [facility] [phone]* (retail)       [address] [place], [place]  [address]       Ph: [phone]       Fax: [phone]   RxID:   [id number]

## 2010-03-10 NOTE — Assessment & Plan Note (Signed)
Summary: RESCHED FROM 10/11/08/ALR   Vital Signs:  Patient profile:   65 year old female Height:      65 inches Weight:      227.4 pounds Pulse rate:   72 / minute Pulse rhythm:   regular BP sitting:   132 / 84  (left arm) Cuff size:   large  Vitals Entered By: Shary Decamp (October 21, 2008 11:44 AM) CC: rov Comments  - ringing in ears is "driving her crazy" -- has been having sxs x 1 year  - ankles swelling - c/o of pain in ankles & calves  - pt states that BP goes up & down like a rollar coaster  - 90/60 -- 140's/80-90  - pt states she feels fine Shary Decamp  October 21, 2008 11:45 AM    History of Present Illness: ROV  - ringing in ears is "driving her crazy" -- has been having sxs x 1 year, bilateral, can not relate to any medicine, denies diff hearing   - ankles swelling - c/o of pain in ankles & calves; calves pain increase w/  walking  - HTN, pt states that BP goes up & down like a rollar coaster:  90/60 -- 140's/80-90  - otherwise  she feels fine  - Hyperlipidemia, on diet only  ("working on diet")  - Osteoporosis-- poor fosamax compliance  - had labs done 08-05-08 for insurance, likes to reviewe results w/  me     Current Medications (verified): 1)  Atenolol 25 Mg Tabs (Atenolol) .[name].. 1 By Mouth Once Daily 2)  Hydrochlorothiazide 25 Mg Tabs (Hydrochlorothiazide) .[name].. 1 By Mouth Once Daily 3)  Benazepril Hcl 10 Mg  Tabs (Benazepril Hcl) .... 2 By Mouth Once Daily 4)  Trazodone Hcl 50 Mg  Tabs (Trazodone Hcl) .... 2 By Mouth Qhs 5)  Ropinirole Hcl 1 Mg  Tabs (Ropinirole Hcl) .[name].. 1 By Mouth Qhs 6)  Topamax 100 Mg Tabs (Topiramate) .... 3 Once Daily 7)  Kapidex 60 Mg  Cpdr (Dexlansoprazole) .... Take One Capsule By Mouth 30 Minutes Before Breakfast 8)  Aspirin 81 Mg Tbec (Aspirin) .... Take 1 Tablet By Mouth Once A Day 9)  Multivitamins  Tabs (Multiple Vitamin) .... Take 1 Tablet By Mouth Once A Day 10)  Hydrocodone-Acetaminophen 7.5-500 Mg Tabs  (Hydrocodone-Acetaminophen) .... Take 2-4 Tablets By Mouth 11)  Flexeril 10 Mg  Tabs (Cyclobenzaprine Hcl) .... Take 1 Tablet By Mouth As Needed 12)  Fosamax 70 Mg Tabs (Alendronate Sodium) .[name].. 1 By Mouth Qwk 13)  Vitamin D 1800 Mg Tablet .... Take 1 1000 Mg and 2 400 Mg Tablets  Allergies (verified): 1)  ! Pcn 2)  ! * Mucinex Dm 3)  ! Morphine  Past History:  Past Medical History: Hyperlipidemia Hypertension Osteoporosis MIGRAINE HEADACHE  Hiatal hernia Depression (-) cath 12-2002 GERD, GASTRITIS, ESOPHAGITIS , BARRETTS ESOPHAGUS-- EGD 05-2007 PERSISTENT DISORDER INITIATING/MAINTAINING SLEEP   RLS BACK PAIN, CHRONIC  MENOPAUSE, SURGICAL (ICD-627.4)  Past Surgical History: Reviewed history from 07/15/2008 and no changes required. Hysterectomy for endometriosis Oophorectomy L5-S1 anterior fusion Bilateral knee arthroscopies Right-shoulder surgery Left-arm surgery Bilateral foot surgeries  Social History: not married live by  herself. Has a son Patient is a former smoker. -stopped 30 years ago Alcohol Use - no Daily Caffeine Use-2 cups daily Illicit Drug Use - no Patient gets regular exercise.  Review of Systems CV:  Denies chest pain or discomfort and shortness of breath with exertion. Resp:  Denies cough and wheezing. GI:  Denies abdominal pain, diarrhea, nausea, and vomiting.  Physical Exam  General:  alert and well-developed.   Head:  normocephalic and atraumatic.   Ears:  R ear normal and L ear normal.   Mouth:  pharynx pink and moist, no erythema, and no exudates.   Abdomen:  soft and non-tender.   Pulses:  normal femoral pulses B Rpedal pulse absent L femoral pulse prsent   Extremities:  no pretibial edema bilaterally  Ankles w/o puffines    Impression & Recommendations:  Problem # 1:  HYPERTENSION (ICD-401.9) BMP 6-10 reviewed, normal, results  d/w patient  ambulatory BPs varies, observe for now  Her updated medication list for this  problem includes:    Atenolol 25 Mg Tabs (Atenolol) .[name]... 1 by mouth once daily    Hydrochlorothiazide 25 Mg Tabs (Hydrochlorothiazide) .[name]... 1 by mouth once daily    Benazepril Hcl 10 Mg Tabs (Benazepril hcl) .[name]... 2 by mouth once daily  BP today: 132/84 Prior BP: 130/90 (08/30/2008)  Labs Reviewed: K+: 3.7 (12/11/2007) Creat: : 1.1 (08/04/2008)   Chol: 222 (08/04/2008)   HDL: 51 (08/04/2008)   LDL: 144 (08/04/2008)   TG: 133 (08/04/2008)  Problem # 2:  TINNITUS (ICD-388.30)  refer to ENT  Orders: ENT Referral (ENT)  Problem # 3:  OSTEOPOROSIS (ICD-733.00) last DEXA 2007, although she states had one later ordered by ortho. States will get report patient considering reclast  Vit D 30 on 04-2008, s/p ergocalciferol  check Vit D on RTC  Her updated medication list for this problem includes:    Fosamax 70 Mg Tabs (Alendronate sodium) .[name]... 1 by mouth qwk  Problem # 4:  HYPERLIPIDEMIA (ICD-272.4) diet only 07-2008 results.........Marland KitchenTc 222, HDL 51, LDL 144  LDL was in the 170s 04-2008, thus improving plans to keep improving her diet   Problem # 5:  LEG PAIN, BILATERAL (ICD-729.5)  no ankle synovitis symptoms suspicious for claudication ----> ABIs   Orders: Cardiology Referral (Cardiology)  Complete Medication List: 1)  Atenolol 25 Mg Tabs (Atenolol) .[name].. 1 by mouth once daily 2)  Hydrochlorothiazide 25 Mg Tabs (Hydrochlorothiazide) .[name].. 1 by mouth once daily 3)  Benazepril Hcl 10 Mg Tabs (Benazepril hcl) .... 2 by mouth once daily 4)  Trazodone Hcl 50 Mg Tabs (Trazodone hcl) .... 2 by mouth qhs 5)  Ropinirole Hcl 1 Mg Tabs (Ropinirole hcl) .[name].. 1 by mouth qhs 6)  Topamax 100 Mg Tabs (Topiramate) .... 3 once daily 7)  Kapidex 60 Mg Cpdr (Dexlansoprazole) .... Take one capsule by mouth 30 minutes before breakfast 8)  Aspirin 81 Mg Tbec (Aspirin) .... Take 1 tablet by mouth once a day 9)  Multivitamins Tabs (Multiple vitamin) .... Take 1 tablet by mouth once a day 10)   Hydrocodone-acetaminophen 7.5-500 Mg Tabs (Hydrocodone-acetaminophen) .... Take 2-4 tablets by mouth 11)  Flexeril 10 Mg Tabs (Cyclobenzaprine hcl) .... Take 1 tablet by mouth as needed 12)  Fosamax 70 Mg Tabs (Alendronate sodium) .[name].. 1 by mouth qwk 13)  Vitamin D 1800 Mg Tablet  .... Take 1 1000 mg and 2 400 mg tablets  Patient Instructions: 1)  Please schedule a follow-up appointment in 4  months YEARLY CHECK UP

## 2010-03-10 NOTE — Assessment & Plan Note (Signed)
Summary: acute only - cough/swh   Vital Signs:  Patient Profile:   65 Years Old Female Weight:      227 pounds Temp:     98.3 degrees F oral Pulse rate:   84 / minute BP sitting:   110 / 80  Vitals Entered By: Shary Decamp (January 10, 2008 10:38 AM)                 Referred by:  Russella Dar PCP:  Marino Rogerson  Chief Complaint:  sick x 11/30; nasal congestion and cough x yest; nausea & watery diarrhea x last pm.  History of Present Illness: sick x 11/30; nasal congestion, cough x yest; nausea & watery diarrhea x last pm has not get the flu shot yet    Updated Prior Medication List: ESTRATEST H.S. 0.625-1.25 MG  TABS (EST ESTROGENS-METHYLTEST) Take 1 tablet by mouth once a day ATENOLOL 25 MG TABS (ATENOLOL) 1 by mouth once daily HYDROCHLOROTHIAZIDE 25 MG TABS (HYDROCHLOROTHIAZIDE) 1 by mouth once daily BENAZEPRIL HCL 10 MG  TABS (BENAZEPRIL HCL) 2 by mouth once daily TRAZODONE HCL 50 MG  TABS (TRAZODONE HCL) 2 by mouth qhs ROPINIROLE HCL 1 MG  TABS (ROPINIROLE HCL) 1 by mouth qhs TOPAMAX 100 MG  TABS (TOPIRAMATE) 1 1/2 by mouth qd KAPIDEX 60 MG  CPDR (DEXLANSOPRAZOLE) Take one capsule by mouth 30 minutes before breakfast * ASA  * VITAMINS  HYDROCODONE-ACETAMINOPHEN 10-325 MG  TABS (HYDROCODONE-ACETAMINOPHEN) 2-4 QD AXERT 12.5 MG  TABS (ALMOTRIPTAN MALATE) PRN FLEXERIL 10 MG  TABS (CYCLOBENZAPRINE HCL) PRN ACTONEL 150 MG TABS (RISEDRONATE SODIUM) 1 by mouth once a month  Current Allergies (reviewed today): ! PCN ! Arloa Koh DM  Past Medical History:    Reviewed history from 12/11/2007 and no changes required:       Hyperlipidemia       Hypertension       Osteoporosis       (-) cath 12-2002       GASTRITIS, ESOPHAGITIS , BARRETTS ESOPHAGUS-- EGD 05-2007       PERSISTENT DISORDER INITIATING/MAINTAINING SLEEP (ICD-307.42)       RESTLESS LEGS SYNDROME (ICD-333.94)       BACK PAIN, CHRONIC (ICD-724.5)       MENOPAUSE, SURGICAL (ICD-627.4)       MIGRAINE HEADACHE      Review  of Systems  General      (+) subjective fever no sick contacts   ENT      Denies sore throat.  Resp      Denies chest pain with inspiration and sputum productive.      no SOB butocc has been difficult to catch her breath  GI      Denies abdominal pain.  MS      Denies muscle aches.   Physical Exam  General:     alert and well-developed.  NAD, non-toxic  Head:     face symetric, slightly  tender at L side  Eyes:     EOMI Ears:     R ear normal and L ear normal.   Nose:     congested Mouth:     no red or d/c  Lungs:     normal respiratory effort, no intercostal retractions, no accessory muscle use, and normal breath sounds.   Heart:     normal rate, regular rhythm, and no murmur.      Impression & Recommendations:  Problem # 1:  VIRAL INFECTION-UNSPEC (ICD-079.99) symptoms likely due to a  viral syndrome, slightly  tender at L maxilary area (?early sinusitis) see instructions   patient is allergic to PCN  Complete Medication List: 1)  Estratest H.s. 0.625-1.25 Mg Tabs (Est estrogens-methyltest) .... Take 1 tablet by mouth once a day 2)  Atenolol 25 Mg Tabs (Atenolol) .[name].. 1 by mouth once daily 3)  Hydrochlorothiazide 25 Mg Tabs (Hydrochlorothiazide) .[name].. 1 by mouth once daily 4)  Benazepril Hcl 10 Mg Tabs (Benazepril hcl) .... 2 by mouth once daily 5)  Trazodone Hcl 50 Mg Tabs (Trazodone hcl) .... 2 by mouth qhs 6)  Ropinirole Hcl 1 Mg Tabs (Ropinirole hcl) .[name].. 1 by mouth qhs 7)  Topamax 100 Mg Tabs (Topiramate) .[name].. 1 1/2 by mouth qd 8)  Kapidex 60 Mg Cpdr (Dexlansoprazole) .... Take one capsule by mouth 30 minutes before breakfast 9)  Asa  10)  Vitamins  11)  Hydrocodone-acetaminophen 10-325 Mg Tabs (Hydrocodone-acetaminophen) .... 2-4 qd 12)  Axert 12.5 Mg Tabs (Almotriptan malate) .... Prn 13)  Flexeril 10 Mg Tabs (Cyclobenzaprine hcl) .... Prn 14)  Actonel 150 Mg Tabs (Risedronate sodium) .[name].. 1 by mouth once a month 15)  Zithromax Z-pak 250 Mg Tabs  (Azithromycin) .... As directed   Patient Instructions: 1)  Get plenty of rest, drink lots of clear liquids, and use Tylenol   for fever and comfort.  2)  Mucinex DM twice a day for cough 3)  Astepro 2 sprays on each side of the nose twice a day until samples gone 4)  start Zpack if no better in  3 days  5)  Return in 7-10 days if you're not better, sooner if you're feeling worse.   Prescriptions: ZITHROMAX Z-PAK 250 MG TABS (AZITHROMYCIN) as directed  #1 x 0   Entered and Authorized by:   Nolon Rod. Pietro Bonura MD   Signed by:   Nolon Rod. Yamari Ventola MD on 01/10/2008   Method used:   Print then Give to Patient   RxID:   [id number]  ]

## 2010-03-10 NOTE — Letter (Signed)
Summary: [facility] -- switched to celebrex   [facility]   Imported By: Lanelle Bal 01/21/2009 09:32:02  _____________________________________________________________________  External Attachment:    Type:   Image     Comment:   External Document

## 2010-03-10 NOTE — Letter (Signed)
Summary: [facility] Instructions  [facility]  [address] [place], [place] [address]   Phone: [phone]  Fax: [phone]       [name]    [birth year]    MRN: [id number]        Procedure Day /Date: Tuesday October 18th, 2011     Arrival Time: 2:00pm     Procedure Time: 3:00pm     Location of Procedure:                    _x _  [facility] (4th Floor)                        PREPARATION FOR COLONOSCOPY WITH MOVIPREP   Starting 5 days prior to your procedure10/13/11 do not eat nuts, seeds, popcorn, corn, beans, peas,  salads, or any raw vegetables.  Do not take any fiber supplements (e.g. Metamucil, Citrucel, and Benefiber).  THE DAY BEFORE YOUR PROCEDURE         DATE: 11/24/09  DAY: Monday  1.  Drink clear liquids the entire day-NO SOLID FOOD  2.  Do not drink anything colored red or purple.  Avoid juices with pulp.  No orange juice.  3.  Drink at least 64 oz. (8 glasses) of fluid/clear liquids during the day to prevent dehydration and help the prep work efficiently.  CLEAR LIQUIDS INCLUDE: Water Jello Ice Popsicles Tea (sugar ok, no milk/cream) Powdered fruit flavored drinks Coffee (sugar ok, no milk/cream) Gatorade Juice: apple, white grape, white cranberry  Lemonade Clear bullion, consomm, broth Carbonated beverages (any kind) Strained chicken noodle soup Hard Candy                             4.  In the morning, mix first dose of MoviPrep solution:    Empty 1 Pouch A and 1 Pouch B into the disposable container    Add lukewarm drinking water to the top line of the container. Mix to dissolve    Refrigerate (mixed solution should be used within 24 hrs)  5.  Begin drinking the prep at 5:00 p.m. The MoviPrep container is divided by 4 marks.   Every 15 minutes drink the solution down to the next mark (approximately 8 oz) until the full liter is complete.   6.  Follow completed prep with 16 oz of clear liquid of your choice  (Nothing red or purple).  Continue to drink clear liquids until bedtime.  7.  Before going to bed, mix second dose of MoviPrep solution:    Empty 1 Pouch A and 1 Pouch B into the disposable container    Add lukewarm drinking water to the top line of the container. Mix to dissolve    Refrigerate  THE DAY OF YOUR PROCEDURE      DATE: 11/25/09 DAY: Tuesday  Beginning at10:00 a.m. (5 hours before procedure):         1. Every 15 minutes, drink the solution down to the next mark (approx 8 oz) until the full liter is complete.  2. Follow completed prep with 16 oz. of clear liquid of your choice.    3. You may drink clear liquids until 1:00pm (2 HOURS BEFORE PROCEDURE).   MEDICATION INSTRUCTIONS  Unless otherwise instructed, you should take regular prescription medications with a small sip of water   as early as possible the morning of your procedure.  OTHER INSTRUCTIONS  You will need a responsible adult at least 66 years of age to accompany you and drive you home.   This person must remain in the waiting room during your procedure.  Wear loose fitting clothing that is easily removed.  Leave jewelry and other valuables at home.  However, you may wish to bring a book to read or  an iPod/MP3 player to listen to music as you wait for your procedure to start.  Remove all body piercing jewelry and leave at home.  Total time from sign-in until discharge is approximately 2-3 hours.  You should go home directly after your procedure and rest.  You can resume normal activities the  day after your procedure.  The day of your procedure you should not:   Drive   Make legal decisions   Operate machinery   Drink alcohol   Return to work  You will receive specific instructions about eating, activities and medications before you leave.    The above instructions have been reviewed and explained to me by   _______________________    I fully understand and can verbalize  these instructions _____________________________ Date _________

## 2010-03-10 NOTE — Assessment & Plan Note (Signed)
Summary: 2 wk follow up//alj  Medications Added ESTRATEST H.S. 0.625-1.25 MG  TABS (EST ESTROGENS-METHYLTEST)         Vital Signs:  Patient Profile:   65 Years Old Female Weight:      236 pounds Temp:     99.2 degrees F oral BP sitting:   110 / 80  Vitals Entered By: Shary Decamp (March 21, 2007 12:49 PM)                 Chief Complaint:  feeling better - still having a little nausea.  History of Present Illness: as above    Prior Medications Reviewed Using: Patient Recall  Updated Prior Medication List: ATENOLOL 25 MG TABS (ATENOLOL) 1 by mouth once daily HYDROCHLOROTHIAZIDE 25 MG TABS (HYDROCHLOROTHIAZIDE) 1 by mouth once daily BENAZEPRIL HCL 10 MG  TABS (BENAZEPRIL HCL) 2 by mouth once daily TRAZODONE HCL 50 MG  TABS (TRAZODONE HCL) 2 by mouth qhs ROPINIROLE HCL 1 MG  TABS (ROPINIROLE HCL) 1 by mouth qhs PROTONIX 40 MG  TBEC (PANTOPRAZOLE SODIUM) qd FOSAMAX 70 MG  TABS (ALENDRONATE SODIUM) qwk TOPAMAX 100 MG  TABS (TOPIRAMATE) qd * ASA  * VITAMINS  HYDROCODONE-ACETAMINOPHEN 10-325 MG  TABS (HYDROCODONE-ACETAMINOPHEN) 2-4 QD AXERT 12.5 MG  TABS (ALMOTRIPTAN MALATE) PRN MECLIZINE HCL 12.5 MG  TABS (MECLIZINE HCL) PRN FLEXERIL 10 MG  TABS (CYCLOBENZAPRINE HCL) PRN RANITIDINE HCL 150 MG  TABS (RANITIDINE HCL) 1 by mouth two times a day ESTRATEST H.S. 0.625-1.25 MG  TABS (EST ESTROGENS-METHYLTEST)   Current Allergies (reviewed today): ! PCN     Review of Systems  GI      some heartburn , mild diarrhea almost gone ill defined stomach dyscomfort:  better, wonders if is b/c she is off protonix    Physical Exam  General:     alert and well-developed.   Lungs:     normal respiratory effort, no intercostal retractions, no accessory muscle use, and normal breath sounds.   Heart:     normal rate, regular rhythm, no murmur, and no gallop.   Abdomen:     soft, no hepatomegaly, and no splenomegaly.  still tender , diffusely, worse slt above-R from  umbilicous (?soft mass)    Impression & Recommendations:  Problem # 1:  ABDOMINAL PAIN, GENERALIZED (ICD-789.07) better but still tender to palpation some  of the Sx may be from protonix  (s/e?) Cont H2Blockers CT abdomen-pelvis Orders: Radiology Referral (Radiology)   Problem # 2:  DIARRHEA (ICD-787.91) better see above  Problem # 3:  did see her gynecologist told everything okay  Problem # 4:  recent labs d/w pt  Complete Medication List: 1)  Atenolol 25 Mg Tabs (Atenolol) .[name].. 1 by mouth once daily 2)  Hydrochlorothiazide 25 Mg Tabs (Hydrochlorothiazide) .[name].. 1 by mouth once daily 3)  Benazepril Hcl 10 Mg Tabs (Benazepril hcl) .... 2 by mouth once daily 4)  Trazodone Hcl 50 Mg Tabs (Trazodone hcl) .... 2 by mouth qhs 5)  Ropinirole Hcl 1 Mg Tabs (Ropinirole hcl) .[name].. 1 by mouth qhs 6)  Protonix 40 Mg Tbec (Pantoprazole sodium) .... Qd 7)  Fosamax 70 Mg Tabs (Alendronate sodium) .... Qwk 8)  Topamax 100 Mg Tabs (Topiramate) .... Qd 9)  Asa  10)  Vitamins  11)  Hydrocodone-acetaminophen 10-325 Mg Tabs (Hydrocodone-acetaminophen) .... 2-4 qd 12)  Axert 12.5 Mg Tabs (Almotriptan malate) .... Prn 13)  Meclizine Hcl 12.5 Mg Tabs (Meclizine hcl) .... Prn 14)  Flexeril 10 Mg Tabs (Cyclobenzaprine  hcl) .... Prn 15)  Ranitidine Hcl 150 Mg Tabs (Ranitidine hcl) .[name].. 1 by mouth two times a day 16)  Estratest H.s. 0.625-1.25 Mg Tabs (Est estrogens-methyltest)     Prescriptions: RANITIDINE HCL 150 MG  TABS (RANITIDINE HCL) 1 by mouth two times a day  #180 x 4   Entered and Authorized by:   Elita Quick E. Genavieve Mangiapane MD   Signed by:   Nolon Rod. Batsheva Stevick MD on 03/21/2007   Method used:   Print then Give to Patient   RxID:   [id number]  ]

## 2010-03-10 NOTE — Procedures (Signed)
Summary: Colonoscopy  Patient: [name] Note: All result statuses are Final unless otherwise noted.  Tests: (1) Colonoscopy (COL)   COL Colonoscopy           DONE     [facility]     [address]     [place], [place]  [address]           COLONOSCOPY PROCEDURE REPORT     PATIENT:  Alexa, [name]  MR#:  [id number]     BIRTHDATE:  1945-05-10, 64 yrs. old  GENDER:  female     ENDOSCOPIST:  Judie Petit T. Russella Dar, MD, [facility]           PROCEDURE DATE:  11/25/2009     PROCEDURE:  Colonoscopy 295[address]     ASA CLASS:  Class II     INDICATIONS:  1) change in bowel habits  2) constipation  3)     family history of colon cancer: MGM, 2 maternal uncles.     MEDICATIONS:   Fentanyl 75 mcg IV, Versed 12 mg IV     DESCRIPTION OF PROCEDURE:   After the risks benefits and     alternatives of the procedure were thoroughly explained, informed     consent was obtained.  Digital rectal exam was performed and     revealed no abnormalities.   The LB [id number] endoscope     was introduced through the anus and advanced to the cecum, which     was identified by both the appendix and ileocecal valve, without     limitations.  The quality of the prep was excellent, using     MoviPrep.  The instrument was then slowly withdrawn as the colon     was fully examined.     <<PROCEDUREIMAGES>>     FINDINGS:  A lipoma was found in the ascending colon. It was soft,     smooth and yellow. It was 2 cm in size and unchanged from prior     exams.  A normal appearing cecum, ileocecal valve, and appendiceal     orifice were identified. The hepatic flexure, transverse, splenic     flexure, descending, sigmoid colon, and rectum appeared     unremarkable. Retroflexed views in the rectum revealed no     abnormalities. The time to cecum =  2  minutes. The scope was then     withdrawn (time =  8.67  min) from the patient and the procedure     completed.           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     2) Miralax qd to bid, titrate to need     3) Given your family history of colon cancer, you should have a     repeat colonoscopy in 5 years           [name] T. Russella Dar, MD, [facility]           CC: Willow Ora, MD           n.     Rosalie DoctorVenita Lick. Maydell Knoebel at 11/25/2009 03:16 PM           Bodmer,  Shasha, [id number]  Note: An exclamation mark (!) indicates a result that was not dispersed into the flowsheet. Document Creation Date: 11/25/2009 3:18 PM _______________________________________________________________________  (1) Order result status: Final Collection or observation date-time: 11/25/2009 15:11 Requested date-time:  Receipt date-time:  Reported date-time:  Referring Physician:   Ordering Physician: Claudette Head [phone]) Specimen Source:  Source: Launa Grill Order Number: [id number] Lab site:   Appended Document: Colonoscopy    Clinical Lists Changes  Observations: Added new observation of COLONNXTDUE: 11/2014 (11/25/2009 16:23)

## 2010-03-10 NOTE — Assessment & Plan Note (Signed)
Summary: 17months//tl  Medications Added TRAZODONE HCL 50 MG  TABS (TRAZODONE HCL) 2 by mouth qhs ROPINIROLE HCL 0.5 MG  TABS (ROPINIROLE HCL) 2 by mouth qhs PROTONIX 40 MG  TBEC (PANTOPRAZOLE SODIUM) qd FOSAMAX 70 MG  TABS (ALENDRONATE SODIUM) qwk TOPAMAX 100 MG  TABS (TOPIRAMATE) qd * ASA  * VITAMINS  HYDROCODONE-ACETAMINOPHEN 10-325 MG  TABS (HYDROCODONE-ACETAMINOPHEN) 2-4 QD AXERT 12.5 MG  TABS (ALMOTRIPTAN MALATE) PRN MECLIZINE HCL 12.5 MG  TABS (MECLIZINE HCL) PRN FLEXERIL 10 MG  TABS (CYCLOBENZAPRINE HCL) PRN      Allergies Added: ! PCN  Vital Signs:  Patient Profile:   65 Years Old Female Weight:      241.4 pounds Pulse rate:   70 / minute Pulse rhythm:   regular BP sitting:   112 / 70  (left arm) Cuff size:   large  Vitals Entered By: Shary Decamp (December 13, 2006 9:03 AM)                 Chief Complaint:  ROV; HAS QUESTIONS ABOUT K.  History of Present Illness: ROV  Current Allergies (reviewed today): ! PCN  Past Medical History:    Hyperlipidemia    Hypertension    Osteoporosis    (-) cath 12-2002  Past Surgical History:    Hysterectomy for endometriosis    Oophorectomy   Family History:    colon ca-- GF,2 uncles    breast ca--sister    MI--F  age: late?   Social History:    Married    live by  myself.    Has a son   Risk Factors:  Tobacco use:  never Alcohol use:  no   Review of Systems       had feet cramps and B facial tingling (low K?) , ate K rich food, felt better no local weakness cough , mild x 2 weeks on ACEi x months Occ nausea x  few days, no vomiting, chronic diarrhea ("all life") amb BP 140s/80s   Physical Exam  General:     alert, well-developed, and overweight-appearing.   Lungs:     normal respiratory effort, no intercostal retractions, and no accessory muscle use.   Heart:     normal rate, regular rhythm, and no murmur.   Extremities:     no edema    Impression & Recommendations:  Problem #  1:  HYPERTENSION (ICD-401.9)  Her updated medication list for this problem includes:    Atenolol 25 Mg Tabs (Atenolol) .[name]... 1 by mouth once daily    Hydrochlorothiazide 25 Mg Tabs (Hydrochlorothiazide) .[name]... 1 by mouth once daily    Benazepril Hcl 10 Mg Tabs (Benazepril hcl) .[name]... 2 by mouth once daily  BP today: 112/70 Prior BP: 122/80 (08/15/2006)  Labs Reviewed: Creat: 1.0 (08/15/2006)  Orders: TLB-BMP (Basic Metabolic Panel-BMET) (80048-METABOL)   Problem # 2:  HEALTH SCREENING (ICD-V70.0) chart reviewed colonoscopy UTD (03-2006, next 2013) has not seen gyn in a while no MMG or PAP recently strongly encouraged to schedule a MMG and see Gyn Td 2000  Problem # 3:  OSTEOPOROSIS (ICD-733.00) Dexa 10-07 Tscore -3.02 (at [facility] orthopedic) ca and vit D rec  Her updated medication list for this problem includes:    Fosamax 70 Mg Tabs (Alendronate sodium) ....[name] Qwk   Problem # 4:  HYPERLIPIDEMIA (ICD-272.4) 07-2005: 192 T chol, 132 LDL (on no meds)  Complete Medication List: 1)  Xanax 0.5 Mg Tabs (Alprazolam) 2)  Atenolol 25 Mg Tabs (Atenolol) .Marland KitchenMarland KitchenMarland Kitchen  1 by mouth once daily 3)  Hydrochlorothiazide 25 Mg Tabs (Hydrochlorothiazide) .[name].. 1 by mouth once daily 4)  Benazepril Hcl 10 Mg Tabs (Benazepril hcl) .... 2 by mouth once daily 5)  Trazodone Hcl 50 Mg Tabs (Trazodone hcl) .... 2 by mouth qhs 6)  Ropinirole Hcl 0.5 Mg Tabs (Ropinirole hcl) .... 2 by mouth qhs 7)  Protonix 40 Mg Tbec (Pantoprazole sodium) .... Qd 8)  Fosamax 70 Mg Tabs (Alendronate sodium) .... Qwk 9)  Topamax 100 Mg Tabs (Topiramate) .... Qd 10)  Asa  11)  Vitamins  12)  Hydrocodone-acetaminophen 10-325 Mg Tabs (Hydrocodone-acetaminophen) .... 2-4 qd 13)  Axert 12.5 Mg Tabs (Almotriptan malate) .... Prn 14)  Meclizine Hcl 12.5 Mg Tabs (Meclizine hcl) .... Prn 15)  Flexeril 10 Mg Tabs (Cyclobenzaprine hcl) .... Prn   Patient Instructions: 1)  Ca and Vit D daily 2)  Mammogram! 3)  see your gynecologist  4)  Please schedule a follow-up appointment in 4 months.    ]

## 2010-03-10 NOTE — Assessment & Plan Note (Signed)
Summary: rov for rls, insomnia   Copy to:  Russella Dar Primary Provider/Referring Provider:  Willow Ora, MD  CC:  1 year follow up , averages 4-6 hrs per night of sleep , takes pt a long time to fall asleep, and wakes 2-3 times per night.  History of Present Illness: The pt comes in today for f/u of her known RLS and insomnia.  She has continued to require trazodone for sleep initiation, but at least is sleeping better than she was initially, and has not experienced dose escalation.  She is taking her dopamine agonist, but feels that she is having symptoms earlier in the evening.  She also is having multiple awakenings at night that she thinks may be due to leg jerks.  Current Medications (verified): 1)  Atenolol 25 Mg Tabs (Atenolol) .[name].. 1 By Mouth Once Daily 2)  Hydrochlorothiazide 25 Mg Tabs (Hydrochlorothiazide) .[name].. 1 By Mouth Once Daily 3)  Benazepril Hcl 10 Mg  Tabs (Benazepril Hcl) .... 2 By Mouth Once Daily 4)  Trazodone Hcl 50 Mg  Tabs (Trazodone Hcl) .... 2 By Mouth Qhs 5)  Ropinirole Hcl 1 Mg  Tabs (Ropinirole Hcl) .[name].. 1 By Mouth Qhs 6)  Topamax 200 Mg Tabs (Topiramate) .... 2 By Mouth At Bedtime 7)  Kapidex 60 Mg  Cpdr (Dexlansoprazole) .... Take One Capsule By Mouth 30 Minutes Before Breakfast 8)  Aspirin 81 Mg Tbec (Aspirin) .... Take 1 Tablet By Mouth Once A Day 9)  Multivitamins  Tabs (Multiple Vitamin) .... Take 1 Tablet By Mouth Once A Day 10)  Hydrocodone-Acetaminophen 7.5-500 Mg Tabs (Hydrocodone-Acetaminophen) .... Take 2-4 Tablets By Mouth 11)  Flexeril 10 Mg  Tabs (Cyclobenzaprine Hcl) .... Take 1 Tablet By Mouth As Needed 12)  Vitamin D 1800 Mg Tablet .... Take 1 1000 Mg and 2 400 Mg Tablets  Allergies (verified): 1)  ! Pcn 2)  ! * Mucinex Dm 3)  ! Morphine  Review of Systems      See HPI  Vital Signs:  Patient profile:   65 year old female Height:      65 inches Weight:      236 pounds BMI:     39.41 O2 Sat:      95 % on Room air Temp:     98.1 degrees F  oral Pulse rate:   71 / minute BP sitting:   104 / 64  (left arm)  Vitals Entered By: Renold Genta RCP, LPN (May 22, 2009 11:25 AM)  O2 Sat at Rest %:  95% O2 Flow:  Room air CC: 1 year follow up , averages 4-6 hrs per night of sleep , takes pt a long time to fall asleep, wakes 2-3 times per night Comments Medications reviewed with patient Renold Genta RCP, LPN  May 22, 2009 11:30 AM    Physical Exam  General:  ow female in nad   Impression & Recommendations:  Problem # 1:  RESTLESS LEGS SYNDROME (ICD-333.94) the pt is having breakthru symptoms earlier in the evening, and possibly at night disturbing her sleep.  I think she would benefit from moving her higher dose of requip to after dinner or earlier, then take an additional 0.5mg  at HS to get her thru the night.  If she continues to have leg symptoms, would check an iron panel to make sure she doesn't have iron deficiency.  Problem # 2:  PERSISTENT DISORDER INITIATING/MAINTAINING SLEEP (ICD-307.42) the pt continues to have issues with sleep onset that are at  least partially controlled with trazodone.  It is unclear if her awakenings at night are due to her RLS vs her insomnia.  I have already worked with her on behavioral techniques, and do not like to rely on sedating meds at night to solve an underlying behavioral issue.  If she continues to have problems, I would recommend referral to psychology or psychiatry for CBT (cognitive behavioral therapy).  Time spent with pt today was .  Other Orders: Est. Patient Level III (42595)  Patient Instructions: 1)  take requip 1mg  after dinner or earlier if leg symptoms start.  Would take 1/2 tab at bedtime.  If your leg symptoms continue, will need to check iron panel to make sure you do not have iron deficiency causing your leg symptoms.   2)  work on weight loss. 3)  followup with me in one year, but call if not getting better.

## 2010-03-10 NOTE — Progress Notes (Signed)
Summary: PT WANTS TO SPEAK WITH A NURSE   Phone Note Call from Patient Call back at [facility] Phone [phone]   Caller: Patient Reason for Call: Talk to Nurse, Talk to Doctor Summary of Call: DR PAZ// PT HAS A FEW  TINGLING IN HER FACE  AND A FEW CRAMPS IN HER RIGHT FOOT. PT SAYS IT STARTED LATE THIS MORNING. PT WANTS TO KNOW WHAT SHE COULD DO. Initial call taken by: Job Founds,  December 05, 2006 1:03 PM  Follow-up for Phone Call        ? this msg dated from10/27 and pt is ion dr Drue Novel office now Follow-up by: Kandice Hams,  December 13, 2006 9:32 AM  Additional Follow-up for Phone Call Additional follow up Details #1::        seen today Additional Follow-up by: [facility] E. Paz MD,  December 13, 2006 9:54 AM

## 2010-03-10 NOTE — Assessment & Plan Note (Signed)
Summary: 6 month roa//lch   Vital Signs:  Patient profile:   65 year old female Weight:      227 pounds Pulse rate:   82 / minute Pulse rhythm:   regular BP sitting:   128 / 82  (left arm) Cuff size:   large  Vitals Entered By: Army Fossa CMA (September 29, 2009 12:53 PM) CC: 6 month ROA Comments Pt c/o having dizzy spells occuring again.   History of Present Illness: routine office visit  Long history of dizziness on and off, symptoms resurface 3 months ago. Described the symptoms as feeling "drunk", staggering. They come on and off, stay hours to all day, frequency varies from nothing for weeks and then one or more episodes  weekly Dizziness triggered by bending , moving her head or looking at the ceiling. No associated headache or nausea  Current Medications (verified): 1)  Atenolol 25 Mg Tabs (Atenolol) .[name].. 1 By Mouth Once Daily 2)  Hydrochlorothiazide 25 Mg Tabs (Hydrochlorothiazide) .[name].. 1 By Mouth Once Daily 3)  Benazepril Hcl 10 Mg  Tabs (Benazepril Hcl) .... 2 By Mouth Once Daily 4)  Trazodone Hcl 50 Mg  Tabs (Trazodone Hcl) .... 2 By Mouth Qhs 5)  Ropinirole Hcl 1 Mg  Tabs (Ropinirole Hcl) .... Take 1 Tab By Mouth At Dinner and 1/2 Tab By Mouth At Bedtime 6)  Topamax 200 Mg Tabs (Topiramate) .... 2 By Mouth At Bedtime 7)  Dexilant 60 Mg Cpdr (Dexlansoprazole) .... One Capsule By Mouth Once Daily 8)  Aspirin 81 Mg Tbec (Aspirin) .... Take 1 Tablet By Mouth Once A Day 9)  Multivitamins  Tabs (Multiple Vitamin) .... Take 1 Tablet By Mouth Once A Day 10)  Hydrocodone-Acetaminophen 7.5-500 Mg Tabs (Hydrocodone-Acetaminophen) .... Take 2-4 Tablets By Mouth 11)  Flexeril 10 Mg  Tabs (Cyclobenzaprine Hcl) .... Take 1 Tablet By Mouth As Needed 12)  Vitamin D 1800 Mg Tablet .... Take 1 1000 Mg and 2 400 Mg Tablets 13)  Estrace 1 Mg Tabs (Estradiol) .[name].. 1 By Mouth Daily  Allergies: 1)  ! Pcn 2)  ! * Mucinex Dm 3)  ! Morphine  Past History:  Past Medical  History: Reviewed history from 04/01/2009 and no changes required. 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 Hysterectomy for endometriosis Oophorectomy L5-S1 anterior fusion Bilateral knee arthroscopies Right-shoulder surgery Left-arm surgery Bilateral foot surgeries  Review of Systems        Hypertension-- ambulatory BPs wnl MIGRAINE HEADACHE -- well controlled w/  topamax  Depression-- symptoms are on-off , not affecting her much  GERD, symptoms well controlled    Physical Exam  General:  alert, well-developed, and overweight-appearing.   Neck:  normal carotid pulses Lungs:  normal respiratory effort, no intercostal retractions, no accessory muscle use, and normal breath sounds.   Heart:  normal rate, regular rhythm, no murmur, and no gallop.   Neurologic:  alert & oriented X3, cranial nerves II-XII intact, strength normal in all extremities, and gait normal.   Psych:  not anxious appearing and not depressed appearing.     Impression & Recommendations:  Problem # 1:  DIZZINESS (ICD-780.4)  symptoms are chronic, worse lately Workup previously was unremarkable Plan: ENT, she may benefit from positional therapy  Orders: ENT  Referral (ENT)  Problem # 2:  HYPERTENSION (ICD-401.9) at goal  Her updated medication list for this problem includes:    Atenolol 25 Mg Tabs (Atenolol) .[name]... 1 by mouth once daily    Hydrochlorothiazide 25 Mg Tabs (Hydrochlorothiazide) .[name]... 1 by mouth once daily    Benazepril Hcl 10 Mg Tabs (Benazepril hcl) .[name]... 2 by mouth once daily  Orders: Venipuncture (96045) TLB-BMP (Basic Metabolic Panel-BMET)  (80048-METABOL) Specimen Handling (40981)  BP today: 128/82 Prior BP: 104/64 (05/22/2009)  Labs Reviewed: K+: 4.2 (04/01/2009) Creat: : 1.1 (04/01/2009)   Chol: 210 (04/01/2009)   HDL: 37.80 (04/01/2009)   LDL: 144 (08/04/2008)   TG: 120.0 (04/01/2009)  Problem # 3:  MIGRAINE HEADACHE (ICD-346.90) well controlled      Problem # 4:  OSTEOPOROSIS (ICD-733.00) see note from 06-13-09: we obtained a DEXA report but we need a hard copy.    Complete Medication List: 1)  Atenolol 25 Mg Tabs (Atenolol) .[name].. 1 by mouth once daily 2)  Hydrochlorothiazide 25 Mg Tabs (Hydrochlorothiazide) .[name].. 1 by mouth once daily 3)  Benazepril Hcl 10 Mg Tabs (Benazepril hcl) .... 2 by mouth once daily 4)  Trazodone Hcl 50 Mg Tabs (Trazodone hcl) .... 2 by mouth qhs 5)  Ropinirole Hcl 1 Mg Tabs (Ropinirole hcl) .... Take 1 tab by mouth at dinner and 1/2 tab by mouth at bedtime 6)  Topamax 200 Mg Tabs (Topiramate) .... 2 by mouth at bedtime 7)  Dexilant 60 Mg Cpdr (Dexlansoprazole) .... One capsule by mouth once daily 8)  Aspirin 81 Mg Tbec (Aspirin) .... Take 1 tablet by mouth once a day 9)  Multivitamins Tabs (Multiple vitamin) .... Take 1 tablet by mouth once a day 10)  Hydrocodone-acetaminophen 7.5-500 Mg Tabs (Hydrocodone-acetaminophen) .... Take 2-4 tablets by mouth 11)  Flexeril 10 Mg Tabs (Cyclobenzaprine hcl) .... Take 1 tablet by mouth as needed 12)  Vitamin D 1800 Mg Tablet  .... Take 1 1000 mg and 2 400 mg tablets 13)  Estrace 1 Mg Tabs (Estradiol) .[name].. 1 by mouth daily  Patient Instructions: 1)  please get a hard copy of your bone density report done  at [facility] orthopedic last year 2)  Please schedule a follow-up appointment in 6 months, fasting, physical exam

## 2010-03-10 NOTE — Assessment & Plan Note (Signed)
Summary: ov for insomnia/rls   Referred by:  Russella Dar PCP:  Drue Novel  Chief Complaint:  follow-up visit for sleep.  History of Present Illness: the patient comes in today for follow-up of her insomnia.  At the last visit, she was placed on trazodone to help with sleep onset, and also Requip for a probable primary movement disorder of sleep.  The patient states that she did much better on these medications, but most recently has had difficulty with sleep disruption secondary to GI complaints.  She is to have a GI workup in the near future.   She has been taking her Requip at bedtime, and I have informed her that this medication can sometimes result in nausea if not taken on a full stomach.  I think she may do better taking it earlier in the evening just after eating.  I have asked her to try and decrease her trazodone to one tablet at bedtime rather than two, and she has forgotten to do this.  Overall, she feels that she has slept much better since being on these medications.     Current Allergies: ! PCN     Review of Systems      See HPI   Vital Signs:  Patient Profile:   65 Years Old Female Weight:      234.25 pounds O2 Sat:      98 % O2 treatment:    Room Air Temp:     98.0 degrees F oral Pulse rate:   80 / minute BP sitting:   104 / 72  (left arm)  Vitals Entered By: Cloyde Reams RN (May 11, 2007 11:23 AM)             Comments Pt is here today for a follow-up visit for her sleep. Pt states she has been having sleep problems related to GI problems which are currently being addressed. Medications reviewed ..................................................................Marland KitchenCloyde Reams RN  May 11, 2007 11:27 AM      Physical Exam  General:     an obese female in no acute distress     Impression & Recommendations:  Problem # 1:  RESTLESS LEGS SYNDROME (ICD-333.94) the patient has done better since being on Requip.  The only change that I would recommend is to take her  nighttime dose just after dinner rather than waiting until bedtime.  This may or may not help with nausea.   Problem # 2:  PERSISTENT DISORDER INITIATING/MAINTAINING SLEEP (ICD-307.42) this has continued to be an issue for her, but is much improved with trazodone and behavioral changes.  I've asked her to continue working on these, but also to try and get her trazodone to one 50 mg tablet at bedtime rather than two.  I would hope at some point that she could get totally off the medication.   Medications Added to Medication List This Visit: 1)  Estratest H.s. 0.625-1.25 Mg Tabs (Est estrogens-methyltest) .... Take 1 tablet by mouth once a day   Patient Instructions: 1)  stay on requip, but move the dose to just after dinner 2)  continue trazadone, but try and decrease the dose if possible to one at bedtime. 3)  work on weight loss 4)  f/u one year.    Prescriptions: ROPINIROLE HCL 1 MG  TABS (ROPINIROLE HCL) 1 by mouth qhs  #90 x 6   Entered and Authorized by:   Barbaraann Share MD   Signed by:   Barbaraann Share MD on 05/11/2007  Method used:   Print then Give to Patient   RxID:   [id number] TRAZODONE HCL 50 MG  TABS (TRAZODONE HCL) 2 by mouth qhs  #180 x 6   Entered and Authorized by:   Barbaraann Share MD   Signed by:   Barbaraann Share MD on 05/11/2007   Method used:   Print then Give to Patient   RxID:   [id number]  ]

## 2010-03-10 NOTE — Procedures (Signed)
Summary: Colonoscopy   Colonoscopy  Procedure date:  03/23/2001  Findings:      Results: Hemorrhoids.     Location:  [facility].    Procedures Next Due Date:    Colonoscopy: 03/2006  Patient Name: [name], [name] MRN: [id number] Procedure Procedures: Colonoscopy CPT: 62130.    with biopsy. CPT: Q5068410.  Personnel: Endoscopist: Venita Lick. Russella Dar, MD, Clementeen Graham.  Exam Location: Exam performed in Outpatient Clinic. Outpatient  Patient Consent: Procedure, Alternatives, Risks and Benefits discussed, consent obtained, from patient. Consent was obtained by the RN.  Indications Symptoms: Diarrhea Hematochezia.  Increased Risk Screening: For family history of colorectal neoplasia, in  grandparent  History  Pre-Exam Physical: Performed Mar 23, 2001. Entire physical exam was normal.  Exam Exam: Extent of exam reached: Cecum, extent intended: Cecum.  The cecum was identified by appendiceal orifice and IC valve. Colon retroflexion performed. ASA Classification: I. Tolerance: good.  Monitoring: Pulse and BP monitoring, Oximetry used. Supplemental O2 given.  Colon Prep Used Golytely for colon prep. Prep results: good.  Sedation Meds: Patient assessed and found to be appropriate for moderate (conscious) sedation. Fentanyl 100 mcg. given IV. Versed 10 mg. given IV.  Findings NORMAL EXAM: Cecum to Sigmoid Colon. Biopsy/Normal Exam taken. Comments: random sig. biopsy taken.  TUMOR: in Ascending Colon. Length: 3 cm, submucosal, Comments: lesion c/w lipoma.  HEMORRHOIDS: Internal. Size: Small. Not bleeding. Not thrombosed. ICD9: Hemorrhoids, Internal: 455.0.   Assessment  Diagnoses: 455.0: Hemorrhoids, Internal.   Events  Unplanned Interventions: No intervention was required.  Unplanned Events: There were no complications. Plans Medication Plan: Await pathology. Continue current medications.  Disposition: After procedure patient sent to recovery. After  recovery patient sent home.  Scheduling/Referral: Colonoscopy, to [facility] T. Russella Dar, MD, [facility], around Mar 23, 2005.    This report was created from the original endoscopy report, which was reviewed and signed by the above listed endoscopist.    cc: Titus Dubin. Alwyn Ren, MD

## 2010-03-10 NOTE — Assessment & Plan Note (Signed)
Summary: 3 MONTH ROA///SPH   Vital Signs:  Patient Profile:   65 Years Old Female Weight:      231.2 pounds Pulse rate:   72 / minute BP sitting:   116 / 70  Vitals Entered By: Shary Decamp (July 19, 2007 10:43 AM)                 Referred by:  Russella Dar PCP:  Drue Novel  Chief Complaint:  rov -- pt wants to know if she should restart fosamax.  History of Present Illness: rov --  pt wants to know if she should restart fosamax    Updated Prior Medication List: ATENOLOL 25 MG TABS (ATENOLOL) 1 by mouth once daily HYDROCHLOROTHIAZIDE 25 MG TABS (HYDROCHLOROTHIAZIDE) 1 by mouth once daily BENAZEPRIL HCL 10 MG  TABS (BENAZEPRIL HCL) 2 by mouth once daily TRAZODONE HCL 50 MG  TABS (TRAZODONE HCL) 2 by mouth qhs ROPINIROLE HCL 1 MG  TABS (ROPINIROLE HCL) 1 by mouth qhs TOPAMAX 100 MG  TABS (TOPIRAMATE) 1 1/2 by mouth qd * ASA  * VITAMINS  HYDROCODONE-ACETAMINOPHEN 10-325 MG  TABS (HYDROCODONE-ACETAMINOPHEN) 2-4 QD AXERT 12.5 MG  TABS (ALMOTRIPTAN MALATE) PRN MECLIZINE HCL 12.5 MG  TABS (MECLIZINE HCL) PRN FLEXERIL 10 MG  TABS (CYCLOBENZAPRINE HCL) PRN RANITIDINE HCL 150 MG  TABS (RANITIDINE HCL) 1 by mouth two times a day ESTRATEST H.S. 0.625-1.25 MG  TABS (EST ESTROGENS-METHYLTEST) Take 1 tablet by mouth once a day KAPIDEX 60 MG  CPDR (DEXLANSOPRAZOLE) Take one capsule by mouth 30 minutes before breakfast  Current Allergies (reviewed today): ! PCN  Past Medical History:    Hyperlipidemia    Hypertension    Osteoporosis    (-) cath 12-2002    GASTRITIS, ESOPHAGITIS , BARRETTS ESOPHAGUS-- EGD 05-2007    PERSISTENT DISORDER INITIATING/MAINTAINING SLEEP (ICD-307.42)    RESTLESS LEGS SYNDROME (ICD-333.94)    BACK PAIN, CHRONIC (ICD-724.5)    MENOPAUSE, SURGICAL (ICD-627.4)    MIGRAINE HEADACHE      Review of Systems  CV      c/o edema  feet and hands  GI      abdominal pain gone heartburn better    Physical Exam  General:     alert and well-developed.   Lungs:     normal respiratory effort, no intercostal retractions, no accessory muscle use, and normal breath sounds.   Heart:     normal rate, regular rhythm, and no murmur.   Extremities:     no pretibial edema bilaterally  no edema at hands Psych:     not anxious appearing and not depressed appearing.      Impression & Recommendations:  Problem # 1:  GASTRITIS (ICD-535.50) better! ok to re-start Fosamax (was hold due to pain) call if pain resurface Her updated medication list for this problem includes:    Kapidex 60 Mg Cpdr (Dexlansoprazole) .[name]... Take one capsule by mouth 30 minutes before breakfast    Ranitidine Hcl 150 Mg Tabs (Ranitidine hcl) .[name]... 1 by mouth two times a day   Problem # 2:  HYPERTENSION (ICD-401.9) Assessment: Unchanged  Her updated medication list for this problem includes:    Atenolol 25 Mg Tabs (Atenolol) .[name]... 1 by mouth once daily    Hydrochlorothiazide 25 Mg Tabs (Hydrochlorothiazide) .[name]... 1 by mouth once daily    Benazepril Hcl 10 Mg Tabs (Benazepril hcl) .[name]... 2 by mouth once daily  BP today: 116/70 Prior BP: 104/72 (05/11/2007)  Labs Reviewed: Creat: 1.0 (03/07/2007) Chol: 177 (04/18/2007)  HDL: 28.6 (04/18/2007)   LDL: 123 (04/18/2007)   TG: 128 (04/18/2007)   Problem # 3:  OSTEOPOROSIS (ICD-733.00) re-start fosamax Her updated medication list for this problem includes:    Fosamax 70 Mg Tabs (Alendronate sodium) .[name]... 1 by mouth once a  week   Problem # 4:  RTC 11-09 for a yearly also pt c/o edema: exam neg observe, low salt   Complete Medication List: 1)  Estratest H.s. 0.625-1.25 Mg Tabs (Est estrogens-methyltest) .... Take 1 tablet by mouth once a day 2)  Atenolol 25 Mg Tabs (Atenolol) .[name].. 1 by mouth once daily 3)  Hydrochlorothiazide 25 Mg Tabs (Hydrochlorothiazide) .[name].. 1 by mouth once daily 4)  Benazepril Hcl 10 Mg Tabs (Benazepril hcl) .... 2 by mouth once daily 5)  Trazodone Hcl 50 Mg Tabs (Trazodone hcl) .... 2 by mouth qhs 6)   Ropinirole Hcl 1 Mg Tabs (Ropinirole hcl) .[name].. 1 by mouth qhs 7)  Topamax 100 Mg Tabs (Topiramate) .[name].. 1 1/2 by mouth qd 8)  Kapidex 60 Mg Cpdr (Dexlansoprazole) .... Take one capsule by mouth 30 minutes before breakfast 9)  Ranitidine Hcl 150 Mg Tabs (Ranitidine hcl) .[name].. 1 by mouth two times a day 10)  Asa  11)  Vitamins  12)  Hydrocodone-acetaminophen 10-325 Mg Tabs (Hydrocodone-acetaminophen) .... 2-4 qd 13)  Axert 12.5 Mg Tabs (Almotriptan malate) .... Prn 14)  Meclizine Hcl 12.5 Mg Tabs (Meclizine hcl) .... Prn 15)  Flexeril 10 Mg Tabs (Cyclobenzaprine hcl) .... Prn 16)  Fosamax 70 Mg Tabs (Alendronate sodium) .[name].. 1 by mouth once a  week   Patient Instructions: 1)  Please schedule a follow-up appointment in 5 months-November  for your yearly    ]

## 2010-03-10 NOTE — Progress Notes (Signed)
Summary: calling to check on pt  ---- Converted from flag ---- ---- 10/26/2007 2:01 PM, Xian Apostol E. Earl Losee MD wrote: check on her ------------------------------  Phone Note Outgoing Call Call back at [facility] [phone]   Summary of Call: Feeling a little better.  Advised fluids/rest.  Call if no better by monday.   .............Marland KitchenShary Decamp  October 27, 2007 4:42 PM

## 2010-03-10 NOTE — Progress Notes (Signed)
Summary: PT NEEDS A WRITTEN RX FOR FOSAMAX   Phone Note Call from Patient Call back at Home Phone [phone]   Caller: Patient Reason for Call: Refill Medication Summary of Call: DR PAZ// PT SAYS SHE WAS JUST SEEN AND SHE FORGOT TO ASK FOR A NEW RX THAT HAD EXPIRED. FOSAMAX PT NEEDS A 90 DAY SUPPLY PLZ MAIL TO PT HOME. Initial call taken by: Job Founds,  March 22, 2007 9:13 AM      Prescriptions: FOSAMAX 70 MG  TABS (ALENDRONATE SODIUM) qwk  #12 x 03   Entered by:   Shary Decamp   Authorized by:   Nolon Rod. Paz MD   Signed by:   Shary Decamp on 03/22/2007   Method used:   Print then Mail to Patient   RxID:   [id number]

## 2010-03-10 NOTE — Progress Notes (Signed)
Summary: CT RESULTS   Phone Note Outgoing Call   Details for Reason: **CT RESULTS--SEE APPEND** Summary of Call: CT [facility] NEVER CALLED PT TO SCHEDULE OV PT HAS OV WITH DR PAZ 3/10 PT AWARE OF RESULTS - ADVISED TO KEEP F/U APPT -- STILL C/O NAUSEA ADVISED DR PAZ WILL DISCUSS FURTHER @ OV ..................................................................Marland KitchenShary Decamp  April 07, 2007 3:24 PM

## 2010-03-10 NOTE — Progress Notes (Signed)
Summary: u.cx results 1/28 - [facility]   Phone Note Outgoing Call   Details for Reason: urine culture results 03/08/07 Summary of Call: urine culture was negative d/c abx Left message on machine to return call ..................................................................Marland KitchenShary Decamp  March 13, 2007 11:57 AM pt aware ..................................................................Marland KitchenShary Decamp  March 13, 2007 12:02 PM

## 2010-03-10 NOTE — Assessment & Plan Note (Signed)
Summary: acute - sore throat,cbs   Vital Signs:  Patient Profile:   65 Years Old Female Weight:      226 pounds Temp:     100.4 degrees F oral BP sitting:   112 / 76  (left arm)  Vitals Entered By: Doristine Devoid (February 22, 2008 4:18 PM)                 Referred by:  Russella Dar PCP:  Drue Novel  Chief Complaint:  sore throat and sinus congestion x4 days fever and chills .  History of Present Illness: 65 yo woman here today for sore throat, nasal congestion for 4-5 days.  diarrhea started 2 days ago- 1 episode of watery diarrhea today.  no fevers at home.  + facial pain, ear pain.  nasal congestion is thick yellow w/ occasional blood.  mild cough- nonproductive.  not taking anything OTC.      Current Allergies (reviewed today): ! PCN ! Arloa Koh DM     Review of Systems      See HPI   Physical Exam  General:     alert and well-developed.  NAD, non-toxic  Head:     TTP over sinuses Eyes:     no injxn or inflammation Ears:     retracted TMs bilaterally Nose:     congested Mouth:     + post nasal drip, no tonsilar erythema or edema Lungs:     normal respiratory effort, no intercostal retractions, no accessory muscle use, and normal breath sounds.   Heart:     normal rate, regular rhythm, and no murmur.   Cervical Nodes:     No lymphadenopathy noted    Impression & Recommendations:  Problem # 1:  SINUSITIS- ACUTE-NOS (ICD-461.9) Assessment: New Hx and PE consistent w/ sinusitis.  pt w/ PCN allergy- Avelox.  Reviewed red flags that should prompt return and supportive care.  Pt expresses understanding and is in agreement w/ this plan. The following medications were removed from the medication list:    Zithromax Z-pak 250 Mg Tabs (Azithromycin) .[name]... As directed  Her updated medication list for this problem includes:    Avelox 400 Mg Tabs (Moxifloxacin hcl) .[name]... 1 tablet by mouth daily x 10 days   Complete Medication List: 1)  Estratest H.s. 0.625-1.25 Mg Tabs  (Est estrogens-methyltest) .... Take 1 tablet by mouth once a day 2)  Atenolol 25 Mg Tabs (Atenolol) .[name].. 1 by mouth once daily 3)  Hydrochlorothiazide 25 Mg Tabs (Hydrochlorothiazide) .[name].. 1 by mouth once daily 4)  Benazepril Hcl 10 Mg Tabs (Benazepril hcl) .... 2 by mouth once daily 5)  Trazodone Hcl 50 Mg Tabs (Trazodone hcl) .... 2 by mouth qhs 6)  Ropinirole Hcl 1 Mg Tabs (Ropinirole hcl) .[name].. 1 by mouth qhs 7)  Topamax 100 Mg Tabs (Topiramate) .[name].. 1 1/2 by mouth qd 8)  Kapidex 60 Mg Cpdr (Dexlansoprazole) .... Take one capsule by mouth 30 minutes before breakfast 9)  Asa  10)  Vitamins  11)  Hydrocodone-acetaminophen 10-325 Mg Tabs (Hydrocodone-acetaminophen) .... 2-4 qd 12)  Axert 12.5 Mg Tabs (Almotriptan malate) .... Prn 13)  Flexeril 10 Mg Tabs (Cyclobenzaprine hcl) .... Prn 14)  Actonel 150 Mg Tabs (Risedronate sodium) .[name].. 1 by mouth once a month 15)  Avelox 400 Mg Tabs (Moxifloxacin hcl) .[name].. 1 tablet by mouth daily x 10 days   Patient Instructions: 1)  Follow up as needed 2)  Take the Avelox as directed for your sinus infection  3)  Drink plenty of fluids to avoid dehydration 4)  Immodium for the diarrhea 5)  Use the nasal spray as needed for congestion 6)  Tylenol as needed for body aches, fevers, chills, pain 7)  If not improving or worsening- please call the office 8)  Hang in there!   Prescriptions: AVELOX 400 MG  TABS (MOXIFLOXACIN HCL) 1 tablet by mouth daily x 10 days  #10 x 0   Entered and Authorized by:   Neena Rhymes MD   Signed by:   Neena Rhymes MD on 02/22/2008   Method used:   Electronically to        [facility] [phone]* (retail)       [address]       [place], [place]  [address]       Ph: [phone]       Fax: [phone]   RxID:   [id number]

## 2010-03-10 NOTE — Assessment & Plan Note (Signed)
Summary: yearly exam--PH   Vital Signs:  Patient Profile:   65 Years Old Female Weight:      227.6 pounds Pulse rate:   85 / minute Pulse rhythm:   regular BP sitting:   130 / 70  (left arm) Cuff size:   large  Vitals Entered By: Shary Decamp (December 11, 2007 10:36 AM)                 Referred by:  Russella Dar PCP:  Drue Novel  Chief Complaint:  yearly - fasting; pt forgets to take fosamax.  History of Present Illness: yearly - fasting;  c/o feet tingling sometimes, likes sugar checked osteoporosis: pt forgets to take fosamax Hyperlipidemia-- diet not good lately Hypertension-- does check BPs mostly in the 120s GASTRITIS, ESOPHAGITIS --asx , good compliance w/  meds     Updated Prior Medication List: ESTRATEST H.S. 0.625-1.25 MG  TABS (EST ESTROGENS-METHYLTEST) Take 1 tablet by mouth once a day ATENOLOL 25 MG TABS (ATENOLOL) 1 by mouth once daily HYDROCHLOROTHIAZIDE 25 MG TABS (HYDROCHLOROTHIAZIDE) 1 by mouth once daily BENAZEPRIL HCL 10 MG  TABS (BENAZEPRIL HCL) 2 by mouth once daily TRAZODONE HCL 50 MG  TABS (TRAZODONE HCL) 2 by mouth qhs ROPINIROLE HCL 1 MG  TABS (ROPINIROLE HCL) 1 by mouth qhs TOPAMAX 100 MG  TABS (TOPIRAMATE) 1 1/2 by mouth qd KAPIDEX 60 MG  CPDR (DEXLANSOPRAZOLE) Take one capsule by mouth 30 minutes before breakfast * ASA  * VITAMINS  HYDROCODONE-ACETAMINOPHEN 10-325 MG  TABS (HYDROCODONE-ACETAMINOPHEN) 2-4 QD AXERT 12.5 MG  TABS (ALMOTRIPTAN MALATE) PRN FLEXERIL 10 MG  TABS (CYCLOBENZAPRINE HCL) PRN FOSAMAX 70 MG  TABS (ALENDRONATE SODIUM) 1 by mouth once a  week  Current Allergies (reviewed today): ! PCN ! Arloa Koh DM  Past Medical History:    Reviewed history from 07/19/2007 and no changes required:       Hyperlipidemia       Hypertension       Osteoporosis       (-) cath 12-2002       GASTRITIS, ESOPHAGITIS , BARRETTS ESOPHAGUS-- EGD 05-2007       PERSISTENT DISORDER INITIATING/MAINTAINING SLEEP (ICD-307.42)       RESTLESS LEGS SYNDROME  (ICD-333.94)       BACK PAIN, CHRONIC (ICD-724.5)       MENOPAUSE, SURGICAL (ICD-627.4)       MIGRAINE HEADACHE   Past Surgical History:    Reviewed history from 12/13/2006 and no changes required:       Hysterectomy for endometriosis       Oophorectomy   Family History:    Reviewed history from 12/13/2006 and no changes required:       colon ca-- GF,2 uncles       breast ca--sister       MI--F  age: late?   Social History:    Reviewed history from 12/13/2006 and no changes required:       Married       live by  myself.       Has a son   Risk Factors: Tobacco use:  never Alcohol use:  no  Mammogram History:     Date of Last Mammogram:  02/09/2007    Results:  normal   PAP Smear History:     Date of Last PAP Smear:  02/09/2007    Results:  normal    Review of Systems  CV      Denies chest pain or discomfort and swelling  of feet.  Resp      Denies cough and shortness of breath.  GI      Denies abdominal pain and bloody stools.      occ diarrhea, very seldom has constipation  GU      Denies hematuria and urinary hesitancy.   Physical Exam  General:     alert and overweight-appearing.   Neck:     no thyromegaly.   Lungs:     normal respiratory effort, no intercostal retractions, no accessory muscle use, and normal breath sounds.   Heart:     normal rate, regular rhythm, and no murmur.   Abdomen:     soft, no distention, no masses, no hepatomegaly, and no splenomegaly.  Slt tender at R mid anterior abdomen Extremities:     no pretibial edema bilaterally  Psych:     Oriented X3, memory intact for recent and remote, normally interactive, good eye contact, not anxious appearing, and not depressed appearing.      Impression & Recommendations:  Problem # 1:  BARRETTS ESOPHAGUS (ICD-530.85) asx   Problem # 2:  HEALTH SCREENING (ICD-V70.0) chart reviewed colonoscopy UTD (03-2006, next 2013) reports a gyn exam 1-09, MMG and PAP (-) , next PAP in 2  years and MMG yearly per patient  Td 2000 Flu shot : advised to get it (we runned out) printed material provided regards shingles  Problem # 3:  OSTEOPOROSIS (ICD-733.00)   switch to Actonel b/c patient forgets fosamax. Same precautions Her updated medication list for this problem includes:    Actonel 150 Mg Tabs (Risedronate sodium) .[name]... 1 by mouth once a month  Orders: T-Vitamin D (25-Hydroxy) (95621-30865)   Problem # 4:  HYPERLIPIDEMIA (ICD-272.4) not on meds never try meds encouraged healthy life style Labs Reviewed: Chol: 177 (04/18/2007)   HDL: 28.6 (04/18/2007)   LDL: 123 (04/18/2007)   TG: 128 (04/18/2007) SGOT: 17 (03/07/2007)   SGPT: 14 (03/07/2007)   Problem # 5:  HYPERTENSION (ICD-401.9) at goal  Her updated medication list for this problem includes:    Atenolol 25 Mg Tabs (Atenolol) .[name]... 1 by mouth once daily    Hydrochlorothiazide 25 Mg Tabs (Hydrochlorothiazide) .[name]... 1 by mouth once daily    Benazepril Hcl 10 Mg Tabs (Benazepril hcl) .[name]... 2 by mouth once daily  Orders: Venipuncture (78469) TLB-Hepatic/Liver Function Pnl (80076-HEPATIC) TLB-BMP (Basic Metabolic Panel-BMET) (80048-METABOL) UA Dipstick w/o Micro (manual) (62952)  BP today: 130/70 Prior BP: 110/70 (10/25/2007)  Labs Reviewed: Creat: 1.0 (03/07/2007) Chol: 177 (04/18/2007)   HDL: 28.6 (04/18/2007)   LDL: 123 (04/18/2007)   TG: 128 (04/18/2007)   Complete Medication List: 1)  Estratest H.s. 0.625-1.25 Mg Tabs (Est estrogens-methyltest) .... Take 1 tablet by mouth once a day 2)  Atenolol 25 Mg Tabs (Atenolol) .[name].. 1 by mouth once daily 3)  Hydrochlorothiazide 25 Mg Tabs (Hydrochlorothiazide) .[name].. 1 by mouth once daily 4)  Benazepril Hcl 10 Mg Tabs (Benazepril hcl) .... 2 by mouth once daily 5)  Trazodone Hcl 50 Mg Tabs (Trazodone hcl) .... 2 by mouth qhs 6)  Ropinirole Hcl 1 Mg Tabs (Ropinirole hcl) .[name].. 1 by mouth qhs 7)  Topamax 100 Mg Tabs (Topiramate) .[name].. 1 1/2 by mouth qd 8)   Kapidex 60 Mg Cpdr (Dexlansoprazole) .... Take one capsule by mouth 30 minutes before breakfast 9)  Asa  10)  Vitamins  11)  Hydrocodone-acetaminophen 10-325 Mg Tabs (Hydrocodone-acetaminophen) .... 2-4 qd 12)  Axert 12.5 Mg Tabs (Almotriptan malate) .... Prn 13)  Flexeril 10 Mg Tabs (Cyclobenzaprine hcl) .... Prn 14)  Actonel 150 Mg Tabs (Risedronate sodium) .[name].. 1 by mouth once a month   Patient Instructions: 1)  Please schedule a follow-up appointment in 4 to 6  months.   Prescriptions: ACTONEL 150 MG TABS (RISEDRONATE SODIUM) 1 by mouth once a month  #3 x 4   Entered and Authorized by:   Elita Quick E. Alnisa Hasley MD   Signed by:   Nolon Rod. Kay Shippy MD on 12/11/2007   Method used:   Print then Give to Patient   RxID:   [id number] ACTONEL 150 MG TABS (RISEDRONATE SODIUM) 1 by mouth once a month  #1 x 12   Entered and Authorized by:   Elita Quick E. Christoper Bushey MD   Signed by:   Nolon Rod. Luvern Mischke MD on 12/11/2007   Method used:   Print then Give to Patient   RxID:   [id number]  ]  Tetanus/Td Immunization History:    Tetanus/Td # 1:  Historical (02/08/1998)    Preventive Care Screening  Mammogram:    Date:  02/09/2007    Results:  normal   Pap Smear:    Date:  02/09/2007    Results:  normal   Last Tetanus Booster:    Date:  02/08/1998    Results:  Historical   Laboratory Results   Urine Tests    Routine Urinalysis   Glucose: negative   (Normal Range: Negative) Bilirubin: negative   (Normal Range: Negative) Ketone: negative   (Normal Range: Negative) Spec. Gravity: 1.010   (Normal Range: 1.003-1.035) Blood: negative   (Normal Range: Negative) pH: 5.0   (Normal Range: 5.0-8.0) Protein: negative   (Normal Range: Negative) Urobilinogen: 0.2   (Normal Range: 0-1) Nitrite: negative   (Normal Range: Negative) Leukocyte Esterace: negative   (Normal Range: Negative)

## 2010-03-10 NOTE — Progress Notes (Signed)
Summary: labs  Phone Note Outgoing Call   Call placed by: Doristine Devoid,  April 09, 2009 11:35 AM Call placed to: Patient Details for Reason: advise patient:  her LDL cholesterol continue to be borderline elevated.  At this point I only recommend  diet and exercise, nutritionist referral if so desired B12 slightly low, start over-the-counter B12 supplements daily follow-up as planned in 6 months  Summary of Call: left message on machine ...Marland KitchenMarland KitchenDoristine Devoid  April 09, 2009 11:35 AM    spoke w/ patient aware of labs and recommendations.Marland KitchenMarland KitchenDoristine Devoid  April 09, 2009 12:18 PM

## 2010-03-10 NOTE — Procedures (Signed)
Summary: COLONOSCOPY  COLONOSCOPY   Imported By: Freddy Jaksch 12/14/2006 10:39:43  _____________________________________________________________________  External Attachment:    Type:   Image     Comment:   INTER

## 2010-03-10 NOTE — Progress Notes (Signed)
Summary: sick  Phone Note Call from Patient   Caller: Patient Summary of Call: Dr.Paz   Call Back [phone]  pt called and said she wanted stacia to talk to stacia, I asked the pt wahy she needed to talk to her and she wouldnt tell me. Initial call taken by: Vanessa [place],  October 25, 2007 9:09 AM  Follow-up for Phone Call        fatigue, cold sxs, diarrhea sxs x 10 days; ?fever patient has had chills, chest discomfort , sob office visit scheduled ..........Marland KitchenShary Decamp  October 25, 2007 11:24 AM

## 2010-03-10 NOTE — Assessment & Plan Note (Signed)
Summary: KAPIDEX REFILL F-UP/YF   History of Present Illness Visit Type: Follow-up Visit Primary GI MD: Elie Goody MD [facility] Primary Provider: Willow Ora, MD Chief Complaint: Patient here for refills on Dexilant. She states that her GERD is well controlled and that she has no current GI symptoms. History of Present Illness:   This 65 year old female has Barrett's esophagus. Her reflux symptoms are under good control on Dexilant. She occasionally has morning dyspeptic symptoms or dyspepsia related to certain foods. She notes intermittent loose stools again related to certain foods.   GI Review of Systems    Reports acid reflux.      Denies abdominal pain, belching, bloating, chest pain, dysphagia with liquids, dysphagia with solids, heartburn, loss of appetite, nausea, vomiting, vomiting blood, weight loss, and  weight gain.      Reports diarrhea.     Denies anal fissure, black tarry stools, change in bowel habit, constipation, diverticulosis, fecal incontinence, heme positive stool, hemorrhoids, irritable bowel syndrome, jaundice, light color stool, liver problems, rectal bleeding, and  rectal pain. Preventive Screening-Counseling & Management  Alcohol-Tobacco     Smoking Status: quit  Caffeine-Diet-Exercise     Does Patient Exercise: yes      Drug Use:  no.     Current Medications (verified): 1)  Atenolol 25 Mg Tabs (Atenolol) .[name].. 1 By Mouth Once Daily 2)  Hydrochlorothiazide 25 Mg Tabs (Hydrochlorothiazide) .[name].. 1 By Mouth Once Daily 3)  Benazepril Hcl 10 Mg  Tabs (Benazepril Hcl) .... 2 By Mouth Once Daily 4)  Trazodone Hcl 50 Mg  Tabs (Trazodone Hcl) .... 2 By Mouth Qhs 5)  Ropinirole Hcl 1 Mg  Tabs (Ropinirole Hcl) .[name].. 1 By Mouth Qhs 6)  Topamax 200 Mg Tabs (Topiramate) .... Take One By Mouth At Bedtime 7)  Kapidex 60 Mg  Cpdr (Dexlansoprazole) .... Take One Capsule By Mouth 30 Minutes Before Breakfast 8)  Aspirin 81 Mg Tbec (Aspirin) .... Take 1 Tablet By Mouth Once A  Day 9)  Multivitamins  Tabs (Multiple Vitamin) .... Take 1 Tablet By Mouth Once A Day 10)  Hydrocodone-Acetaminophen 7.5-500 Mg Tabs (Hydrocodone-Acetaminophen) .... Take 2-4 Tablets By Mouth As Needed 11)  Flexeril 10 Mg  Tabs (Cyclobenzaprine Hcl) .... Take 1 Tablet By Mouth As Needed 12)  Fosamax 70 Mg Tabs (Alendronate Sodium) .[name].. 1 By Mouth Qwk 13)  Vitamin D 1800 Mg Tablet .... Take 1 1000 Mg and 2 400 Mg Tablets  Allergies (verified): 1)  ! Pcn 2)  ! * Mucinex Dm 3)  ! Morphine  Past History:  Past Medical History: Reviewed history from 07/12/2008 and no changes required. Hyperlipidemia Hypertension Osteoporosis (-) cath 12-2002 GASTRITIS, ESOPHAGITIS , BARRETTS ESOPHAGUS-- EGD 05-2007 PERSISTENT DISORDER INITIATING/MAINTAINING SLEEP (ICD-307.42) RESTLESS LEGS SYNDROME (ICD-333.94) BACK PAIN, CHRONIC (ICD-724.5) MENOPAUSE, SURGICAL (ICD-627.4) MIGRAINE HEADACHE  GERD Hiatal hernia Depression  Past Surgical History: Hysterectomy for endometriosis Oophorectomy L5-S1 anterior fusion Bilateral knee arthroscopies Right-shoulder surgery Left-arm surgery Bilateral foot surgeries  Family History: colon ca-- GF,2 uncles breast ca--sister MI--F  age: late?  Family History of Esophageal Cancer: Brother Family History of Diabetes: Aunt Family History of Heart Disease: Father  Social History: Married live by  myself. Has a son Patient is a former smoker. -stopped 30 years ago Alcohol Use - no Daily Caffeine Use-2 cups daily Illicit Drug Use - no Patient gets regular exercise. Smoking Status:  quit Drug Use:  no Does Patient Exercise:  yes  Review of Systems  The pertinent positives and negatives are noted as above and in the HPI. All other ROS were reviewed and were negative.   Vital Signs:  Patient profile:   65 year old female Height:      65 inches Weight:      228.25 pounds BMI:     38.12 BSA:     2.09 Pulse rate:   64 / minute Pulse rhythm:    regular BP sitting:   98 / 58  (right arm)  Vitals Entered By: Hortense Ramal CMA (July 15, 2008 3:23 PM)  Physical Exam  General:  Well developed, well nourished, no acute distress. obese.   Head:  Normocephalic and atraumatic. Eyes:  PERRLA, no icterus. Ears:  Normal auditory acuity. Mouth:  No deformity or lesions, dentition normal. Lungs:  Clear throughout to auscultation. Heart:  Regular rate and rhythm; no murmurs, rubs,  or bruits. Abdomen:  Soft, nontender and nondistended. No masses, hepatosplenomegaly or hernias noted. Normal bowel sounds. Msk:  Symmetrical with no gross deformities. Normal posture. Pulses:  Normal pulses noted. Extremities:  No clubbing, cyanosis, edema or deformities noted. Neurologic:  Alert and  oriented x4;  grossly normal neurologically. Psych:  Alert and cooperative. Normal mood and affect.  Impression & Recommendations:  Problem # 1:  BARRETTS ESOPHAGUS (ICD-530.85) Assessment Unchanged Continued Dexilant 60 mg daily and all standard antireflux measures. Surveillance EGD recommended in April 2012.  Problem # 2:  FAMILY HX COLON CANCER (ICD-V16.0) Assessment: Unchanged MGF and 2 unlces with colon cancer. Screening colonoscopy recommended in February 2013.   Patient Instructions: 1)  Please schedule a follow-up appointment in 1 year. 2)  Avoid foods high in acid content ( tomatoes, citrus juices, spicy foods) . Avoid eating within 3 to 4 hours of lying down or before exercising. Do not over eat; try smaller more frequent meals. Elevate head of bed four inches when sleeping.  3)  The medication list was reviewed and reconciled.  All changed / newly prescribed medications were explained.  A complete medication list was provided to the patient / caregiver. Prescriptions: KAPIDEX 60 MG  CPDR (DEXLANSOPRAZOLE) Take one capsule by mouth 30 minutes before breakfast  #30 x 11   Entered by:   Christie Nottingham CMA   Authorized by:   Meryl Dare MD [facility]    Signed by:   Christie Nottingham CMA on 07/15/2008   Method used:   Electronically to        [facility] [phone]* (retail)       [address] [place], [place]  [address]       Ph: [phone]       Fax: [phone]   RxID:   [id number]

## 2010-03-10 NOTE — Progress Notes (Signed)
Summary: question re Kapidex   Phone Note Call from Patient Call back at [facility] Phone [phone]   Call For: DR Russella Dar Reason for Call: Talk to Nurse Summary of Call: Taking Rande Brunt is concerned about side effects. Also wants a list of foods she was told to stay away from. Chart req from cf Initial call taken by: Leanor Kail [facility],  Jun 28, 2007 1:22 PM  Follow-up for Phone Call        Pt states she has diarrhea but does not know if the Kapidex is the reason for her symptoms. Pt has always had these sx off and on. Pt was told to continue Imodium and if symptoms increase to call back. Pt states she doesn't know what foods to avoid with her acid reflux.  Pt was given anti-reflux measures and mailed to pt. Follow-up by: Christie Nottingham CMA,  Jun 28, 2007 4:08 PM

## 2010-03-10 NOTE — Assessment & Plan Note (Signed)
Summary: 6 MONTH F/U//CA  Medications Added XANAX 0.5 MG TABS (ALPRAZOLAM)  ATENOLOL 25 MG TABS (ATENOLOL)  HYDROCHLOROTHIAZIDE 25 MG TABS (HYDROCHLOROTHIAZIDE)  BENAZEPRIL HCL 10 MG  TABS (BENAZEPRIL HCL) one p.o. daily for 6 days then two p.o. daily        Vital Signs:  Patient Profile:   65 Years Old Female Weight:      237.8 pounds Pulse rate:   72 / minute BP sitting:   126 / 88  (left arm)  Pt. in pain?   no  Vitals Entered By: Doristine Devoid (July 27, 2006 3:43 PM)                Chief Complaint:  6 MONTH F/U AND WAS IN HOSP ON MON. DUE TO LOW POTASSIUM.  History of Present Illness: has not been feeling well lately. She felt lightheaded, she also felt tingly @l  her face, lips, hands went to the restroom, but will work, chest x-ray, EKGs, she was found to have a very low potassium. She was given IV fluids, IV potassium and p.o. potassium. Now feels better, the tingling has subsided. She remained concerned about the fact that her blood pressure varies from one moment of the day to another  her ambulatory blood pressures are consistently elevated to 180 to 200/99 to 110    Past Medical History:    Hyperlipidemia    Hypertension    Osteoporosis   Social History:    Married    leave myself.    Has a son    Review of Systems       records from [facility] our review. CT of the head was nonacute. Chest x-ray was nonacute. CBC was normal.  Potassium was 2.8.  Creatinine 0.8 , cardiac enzymes was negative. she had two EKGs, the patient was told they were "different"   Physical Exam  General:     alert and well-developed.   Lungs:     normal respiratory effort, no intercostal retractions, and no accessory muscle use.   Heart:     normal rate, regular rhythm, and no murmur.   Extremities:     no edema    Impression & Recommendations:  Problem # 1:  HYPERTENSION (ICD-401.9) poorly controlled. Now with hypokalemia. add benazepril  Her  updated medication list for this problem includes:    Atenolol 25 Mg Tabs (Atenolol)    Hydrochlorothiazide 25 Mg Tabs (Hydrochlorothiazide)    Benazepril Hcl 10 Mg Tabs (Benazepril hcl) ..... One p.o. daily for 6 days then two p.o. daily   Problem # 2:  FATIGUE (ICD-780.79) on chart review she has chronic fatigue for more than a year. Will call the ER and try to get the actual EKGs  Problem # 3:  DIZZINESS (ICD-780.4) also a chronic symptom.  Workup done 2- 2008 show a MRI of the brain with no acute findings and a carotid ultrasound that was 0 to 39%.  Problem # 4:  face-to-face approximately 25 minutes due to chart review and ER records review     Medications Added to Medication List This Visit: 1)  Xanax 0.5 Mg Tabs (Alprazolam) 2)  Atenolol 25 Mg Tabs (Atenolol) 3)  Hydrochlorothiazide 25 Mg Tabs (Hydrochlorothiazide) 4)  Benazepril Hcl 10 Mg Tabs (Benazepril hcl) .... One p.o. daily for 6 days then two p.o. daily   Patient Instructions: 1)  continue with atenolol and hydrochlorothiazide. 2)  Start the new medication called benazepril 3)  the new medication  works well with hydrochlorothiazide, should regulate your  potassium and  blood pressure. 4)  Come back in two weeks. 5)  If you have any problems, let us know.

## 2010-03-10 NOTE — Letter (Signed)
Summary: Primary Care Consult Scheduled Letter  [facility] [place]  [address] [place], [place] [address]   Phone: [phone]  Fax: [phone]      04/24/2007 MRN: [id number]  [facility] [address] [place], [place]  [address]    Dear Ms. Woodin,      We have scheduled an appointment for you.  At the recommendation of Dr.PAZ, we have scheduled you a consult with DR Lynett Fish [facility] [address] 3RD FLOOR on 04.03.09 @ 2:15 CK IN 2:00.  Their phone number is [phone].  If this appointment day and time is not convenient for you, please feel free to call the office of the doctor you are being referred to at the number listed above and reschedule the appointment.     It is important for you to keep your scheduled appointments. We are here to make sure you are given good patient care. If yu have questions or you have made changes to your appointment, please notify us at  [phone], ask for [facility].    Thank you,  Patient Care Coordinator [place] [facility]

## 2010-03-10 NOTE — Miscellaneous (Signed)
Summary: Dexilant refill  Clinical Lists Changes  Medications: Rx of DEXILANT 60 MG CPDR (DEXLANSOPRAZOLE) one capsule by mouth once daily;  #30 x 0;  Signed;  Entered by: Christie Nottingham CMA (AAMA);  Authorized by: Meryl Dare MD [facility];  Method used: Electronically to [facility] [phone]*, [address] [place], [place], [place]  [address], Ph: [phone], Fax: [phone]    Prescriptions: DEXILANT 60 MG CPDR (DEXLANSOPRAZOLE) one capsule by mouth once daily  #30 x 0   Entered by:   Christie Nottingham CMA (AAMA)   Authorized by:   Meryl Dare MD [facility]   Signed by:   Christie Nottingham CMA (AAMA) on 10/30/2009   Method used:   Electronically to        [facility] [phone]* (retail)       [address] [place], [place]  [address]       Ph: [phone]       Fax: [phone]   RxID:   [id number]

## 2010-03-10 NOTE — Letter (Signed)
Summary: Research scientist (life sciences)   Imported By: Freddy Jaksch 08/15/2007 09:50:30  _____________________________________________________________________  External Attachment:    Type:   Image     Comment:   External Document

## 2010-03-10 NOTE — Progress Notes (Signed)
Summary: Medication refill  Medications Added DEXILANT 60 MG CPDR (DEXLANSOPRAZOLE) one capsule by mouth once daily       Phone Note Call from Patient Call back at [facility] Phone [phone]   Caller: Patient Call For: Dr. Russella Dar Reason for Call: Refill Medication Summary of Call: Refill on her Dexilant....sch'd appt for 11-03-09 Initial call taken by: Karna Christmas,  September 03, 2009 4:37 PM  Follow-up for Phone Call        Rx was sent to pts pharmacy and pt notified to keep appt for any further refills. Pt verbalized understanding.  Follow-up by: Christie Nottingham CMA Duncan Dull),  September 04, 2009 8:19 AM    New/Updated Medications: DEXILANT 60 MG CPDR (DEXLANSOPRAZOLE) one capsule by mouth once daily Prescriptions: DEXILANT 60 MG CPDR (DEXLANSOPRAZOLE) one capsule by mouth once daily  #30 x 1   Entered by:   Christie Nottingham CMA (AAMA)   Authorized by:   Meryl Dare MD [facility]   Signed by:   Christie Nottingham CMA (AAMA) on 09/04/2009   Method used:   Electronically to        [facility] [phone]* (retail)       [address]       [place], [place]  [address]       Ph: [phone] or [phone]       Fax: [phone]   RxID:   [id number]

## 2010-03-10 NOTE — Assessment & Plan Note (Signed)
Summary: rov for RLS, insomnia   Copy to:  Russella Dar Primary Provider/Referring Provider:  Drue Novel  CC:  f/u sleep -occas has restless night and wakes up feeling fatigued.[name]  History of Present Illness: the pt comes in for f/u of her RLS and sleep maintenance issues.  Overall, she feels that she is doing well.  The majority of the nights she sleeps well, and doesn't think she has breakthru leg jerks.  She feels the requip has really helped.  On some nights however, she sleeps thru the night, but doesn't feel rested the next day.  She will find the covers disheveled on those days.  She has chronic back and knee issues, but is unsure if chronic pain is contributing to her sleeping issues.  She has tried reducing the trazodone dose to one a day, but has multiple awakenings on the nights she does this.  Current Medications (verified): 1)  Atenolol 25 Mg Tabs (Atenolol) .[name].. 1 By Mouth Once Daily 2)  Hydrochlorothiazide 25 Mg Tabs (Hydrochlorothiazide) .[name].. 1 By Mouth Once Daily 3)  Benazepril Hcl 10 Mg  Tabs (Benazepril Hcl) .... 2 By Mouth Once Daily 4)  Trazodone Hcl 50 Mg  Tabs (Trazodone Hcl) .... 2 By Mouth Qhs 5)  Ropinirole Hcl 1 Mg  Tabs (Ropinirole Hcl) .[name].. 1 By Mouth Qhs 6)  Topamax 200 Mg Tabs (Topiramate) .... Take One By Mouth At Bedtime 7)  Kapidex 60 Mg  Cpdr (Dexlansoprazole) .... Take One Capsule By Mouth 30 Minutes Before Breakfast 8)  Asa 9)  Vitamins 10)  Hydrocodone-Acetaminophen 10-325 Mg  Tabs (Hydrocodone-Acetaminophen) .... 2-4 Qd 11)  Flexeril 10 Mg  Tabs (Cyclobenzaprine Hcl) .... Prn 12)  Fosamax 70 Mg Tabs (Alendronate Sodium) .[name].. 1 By Mouth Qwk 13)  Ergocalciferol 50000 Unit Caps (Ergocalciferol) .[name].. 1 By Mouth Qwk  Allergies (verified): 1)  ! Pcn 2)  ! * Mucinex Dm  Review of Systems      See HPI  Vital Signs:  Patient profile:   65 year old female Height:      65 inches Weight:      230.50 pounds BMI:     38.50 O2 Sat:      99 % Temp:     98 degrees F  oral Pulse rate:   70 / minute BP sitting:   112 / 68  (left arm) Cuff size:   large  Vitals Entered By: Abigail Miyamoto RN (May 28, 2008 11:12 AM)  O2 Sat on room air at rest %:  99 CC: f/u sleep -occas has restless night and wakes up feeling fatigued. Comments Medications reviewed with patient Abigail Miyamoto RN  May 28, 2008 11:12 AM    Physical Exam  General:  obese female in nad   Impression & Recommendations:  Problem # 1:  RESTLESS LEGS SYNDROME (ICD-333.94) the pt feels that she is doing well from this standpoint. She clearly has increased symptoms on the nights she forgets to take her requip.  She will ask her bedpartner if she has breakthru leg jerks even when she takes her med.  Problem # 2:  PERSISTENT DISORDER INITIATING/MAINTAINING SLEEP (ICD-307.42) the pt is doing well on the trazodone.  I have asked her to try and decrease the dose to one at bedtime, and I considered the possibility the higher dose could be making her feel "hungover" in the am's.  She has chronic pain issues as well, and this can contribute to her "bad days". She does snore, but her  sleep study did not show any evidence for sleep disordered breathing.  Medications Added to Medication List This Visit: 1)  Topamax 200 Mg Tabs (Topiramate) .... Take one by mouth at bedtime  Other Orders: Est. Patient Level III ([address])  Patient Instructions: 1)  continue on requip 2)  try to decrease trazodone to one a day if possible 3)  continue to work on weight loss. 4)  follow up with me in 12mos or sooner if problems.

## 2010-03-10 NOTE — Procedures (Signed)
Summary: Upper Endoscopy  Patient: [name] Note: All result statuses are Final unless otherwise noted.  Tests: (1) Upper Endoscopy (EGD)   EGD Upper Endoscopy       DONE     [facility]     [address]     [place], [place]  [address]           ENDOSCOPY PROCEDURE REPORT     PATIENT:  Alexa, [name]  MR#:  [id number]     BIRTHDATE:  07/24/1945, 64 yrs. old  GENDER:  female     ENDOSCOPIST:  Judie Petit T. Russella Dar, MD, [facility]           PROCEDURE DATE:  11/25/2009     PROCEDURE:  EGD with biopsy     ASA CLASS:  Class II     INDICATIONS:  Barrett's Esophagus     MEDICATIONS:  Fentanyl 25 mcg IV, Versed 2 mg IV     TOPICAL ANESTHETIC:  Exactacain Spray     DESCRIPTION OF PROCEDURE:   After the risks benefits and     alternatives of the procedure were thoroughly explained, informed     consent was obtained.  The LB [id number] endoscope was     introduced through the mouth and advanced to the second portion of     the duodenum, without limitations.  The instrument was slowly     withdrawn as the mucosa was fully examined.     <<PROCEDUREIMAGES>>     There were multiple polyps identified in the body      stomach, 2-6 mm in size. Appeared typical for fundic gland polyps.     Multiple biopsies were obtained and sent to pathology.  Barrett's     esophagus was found in the distal esophagus. It was 2 cm in     length. Multiple biopsies were obtained and sent to pathology. The     duodenal bulb was normal in appearance, as was the postbulbar     duodenum.  Otherwise the examination was normal. Retroflexed views     revealed a hiatal hernia, 3 cm. The scope was then withdrawn from     the patient and the procedure completed.     COMPLICATIONS:  None           ENDOSCOPIC IMPRESSION:     1) 2 - 6 mm polyps, multiple in the body and fundus of the     stomach     2) 2 cm barrett's esophagus     3) Small hiatal hernia           RECOMMENDATIONS:     1) Anti-reflux regimen     2)  continue PPI     3) Await pathology results     4) FOLLOW UP EGD FOR REPEAT BARRETT'S SURVEILLANCE IN 3 YEARS     pending pathology review           Cadi Rhinehart T. Russella Dar, MD, [facility]           CC:  Willow Ora, MD           n.     Rosalie DoctorVenita Lick. Yanette Tripoli at 11/25/2009 03:30 PM           [place], [place], [phone]  Note: An exclamation mark (!) indicates a result that was not dispersed into the flowsheet. Document Creation Date: 11/25/2009 3:31 PM _______________________________________________________________________  (1) Order result status: Final Collection or observation date-time: 11/25/2009 15:23 Requested date-time:  Receipt date-time:  Reported date-time:  Referring Physician:   Ordering Physician: Claudette Head [phone]) Specimen Source:  Source: Launa Grill Order Number: [id number] Lab site:   Appended Document: Upper Endoscopy     Procedures Next Due Date:    EGD: 11/2013

## 2010-03-10 NOTE — Progress Notes (Signed)
Summary: lab results  Phone Note Outgoing Call   Details for Reason: LAB RESULTS: advised patient,labs are fine. The vitamin D is a slightly low, if she is not taking any over-the-counter supplements she is to start 800 units of vitamin D daily.  If she is already doing that, call ergocalciferol 50,000 units weekly for 3 months Signed by [facility] E. Paz MD on 12/15/2007 at 6:36 AM  Summary of Call: Left message on machine to return call.........Marland KitchenShary Decamp  December 15, 2007 10:41 AM discussed with patient .......Marland KitchenShary Decamp  December 15, 2007 10:57 AM

## 2010-03-10 NOTE — Assessment & Plan Note (Signed)
Summary: hosp f/u chest pain/cbs   Vital Signs:  Patient profile:   65 year old female Weight:      231 pounds Pulse rate:   70 / minute Pulse rhythm:   regular BP sitting:   134 / 86  (left arm) Cuff size:   large  Vitals Entered By: Army Fossa CMA (January 14, 2010 11:16 AM) CC: Hospital f/u Comments had chest pain discuss pneumovax  flu shot [facility] ([place])   History of Present Illness: Hospital followup   DATE OF ADMISSION:  01/07/2010   DATE OF DISCHARGE:  01/09/2010  patient was admitted with atypical chest pain, chart reviewed holding HCTZ due to low BP workup included the following:  CKs slightly elevated in the 400s with normal troponins CT angiogram of the chest negative Echocardiogram      Technically difficult study with poor acoustic windows. Normal LV size with low normal to mildly            reduced systolic function, EF  50-55%. No regional wall motion abnormalities.  a negative nuclear medicine stress test   Chest x-ray normal 01/08/10 total cholesterol 196, LDL 147, HDL 30 TSH 1.9 Creatinine 1.61, potassium 3.9 01-09-10 white count 6.9, hemoglobin 10.5, platelets 168. Iron 68 normal, B12  ~ 1300 normal, folate within normal   Current Medications (verified): 1)  Atenolol 25 Mg Tabs (Atenolol) .[name].. 1 By Mouth Once Daily 2)  Benazepril Hcl 10 Mg  Tabs (Benazepril Hcl) .... 2 By Mouth Once Daily 3)  Trazodone Hcl 50 Mg  Tabs (Trazodone Hcl) .... 2 By Mouth Qhs 4)  Ropinirole Hcl 1 Mg  Tabs (Ropinirole Hcl) .... Take 1 Tab By Mouth At Dinner and 1/2 Tab By Mouth At Bedtime 5)  Topamax 200 Mg Tabs (Topiramate) .... 2 By Mouth At Bedtime 6)  Dexilant 60 Mg Cpdr (Dexlansoprazole) .... One Capsule By Mouth Once Daily 7)  Bayer Aspirin 325 Mg Tabs (Aspirin) .... Qd 8)  Multivitamins  Tabs (Multiple Vitamin) .... Take 1 Tablet By Mouth Once A Day 9)  Hydrocodone-Acetaminophen 7.5-500 Mg Tabs (Hydrocodone-Acetaminophen) .... Take 1 1/2 Tablets By  Mouth 10)  Flexeril 10 Mg  Tabs (Cyclobenzaprine Hcl) .... Take 1 Tablet By Mouth As Needed 11)  Vitamin D 1800 Mg Tablet .... Take 1 1000 Mg and 2 400 Mg Tablets 12)  Estrace 1 Mg Tabs (Estradiol) .[name].. 1 By Mouth Daily 13)  B Complex  Tabs (B Complex Vitamins) .... Take 1 Tablet By Mouth Once A Day 14)  Nitrostat 0.4 Mg Subl (Nitroglycerin) .... As Needed 15)  Calicum, Vitamin D  Allergies (verified): 1)  ! Pcn 2)  ! * Mucinex Dm 3)  ! Morphine  Past History:  Past Medical History: Hyperlipidemia Hypertension Osteoporosis MIGRAINE HEADACHE - topamax  Hiatal hernia Depression (-) cath 12-2002 G!----GERD, GASTRITIS, ESOPHAGITIS, BARRETTS ESOPHAGUS no dysplasia last EGD 3-11, + Barrett's,next 3 years PERSISTENT DISORDER INITIATING/MAINTAINING SLEEP   RLS BACK PAIN, CHRONIC  MENOPAUSE, SURGICAL  01-2010 chest pain, negative stress test, echo EF 55%. See report for other minor abnormalities. CKs slightly  elevated w/ normal troponins   Past Surgical History: Reviewed history from 07/15/2008 and no changes required. Hysterectomy for endometriosis Oophorectomy L5-S1 anterior fusion Bilateral knee arthroscopies Right-shoulder surgery Left-arm surgery Bilateral foot surgeries  Review of Systems        since she left the hospital she continued w/  chest pain. Described the chest pain as substernal, definitely worse when she does  things like cleaning her house, walking her dog; apparently not worse with moving her arms or reaching across. Sometimes pain is at rest. Reports that her chest pain was cleared while  she was wearing a nitroglycerin patch The pain is associated with shortness of breath but no diaphoresis or sweats. He usually goes away 10 minutes after she stopped being active. No radiation. can't tell if it is associated with food intake CV:  she is holding her HCTZ as recommended at the hospital but has not checked her ambulatory BPs. Resp:  some cough, no  wheezing. GI:  history of GERD, she is currently asymptomatic. Denies dysphagia or odynophagia.   Physical Exam  General:  alert, well-developed, and overweight-appearing. no apparent distress  Chest Wall:  slightly tender to palpation L>R sternal area Lungs:  normal respiratory effort, no intercostal retractions, no accessory muscle use, and normal breath sounds.   Heart:  normal rate, regular rhythm, no murmur, and no gallop.   Extremities:  no edema   Impression & Recommendations:  Problem # 1:  CHEST PAIN (ICD-786.50) patient was recently admitted with exertional chest pain associated with shortness of breath. CT angiogram of the chest negative, echo showed slightly decreased EF and other minor abnormalities, stress test was  negative. I am somehow concerned about the ongoing chest pain because it is exertional. differential diagnosis includes: CAD esophageal spasm because it responded to nitroglycerin GERD Muscle skeletal At this point, she has not tried any nitroglycerin since she left the hospital. "I don't know when to use it"" Plan: Keep appointment with cardiology, further workup? ER if symptoms persistent or severe nitroglycerin one sublingual every 5 minutes if chest pain. Discussed with patient  Problem # 2:  HYPERTENSION (ICD-401.9) patient is holding HCTZ because the blood pressure was low at the hospital. No change for now The following medications were removed from the medication list:    Hydrochlorothiazide 25 Mg Tabs (Hydrochlorothiazide) .[name]... 1 by mouth once daily Her updated medication list for this problem includes:    Atenolol 25 Mg Tabs (Atenolol) .[name]... 1 by mouth once daily    Benazepril Hcl 10 Mg Tabs (Benazepril hcl) .[name]... 2 by mouth once daily  Problem # 3:  HYPERLIPIDEMIA (ICD-272.4) mild hyperlipidemia, on no medication 12/1/11labs--------- total cholesterol 196, LDL 147, HDL 30 plan: Start Lipitor 10 mg Labs Reviewed: SGOT: 19 (04/01/2009)    SGPT: 15 (04/01/2009)   HDL:37.80 (04/01/2009), 51 (08/04/2008)  LDL:144 (08/04/2008), DEL (04/09/2008)  Chol:210 (04/01/2009), 222 (08/04/2008)  Trig:120.0 (04/01/2009), 133 (08/04/2008)  Her updated medication list for this problem includes:    Lipitor 10 Mg Tabs (Atorvastatin calcium) ..... One by mouth at bedtime  Problem # 4:  ? of ANEMIA (ICD-285.9)  labs from 01-09-10 : white count 6.9, hemoglobin 10.5, platelets 168. Iron 68 normal, B12  ~ 1300 normal, folate within normal  Hemoglobin was  slightly lower than baseline @ the hospital. Iron  normal.   hemoglobin today 11.9  Orders: Hgb (16109)  Problem # 5:  BARRETTS ESOPHAGUS (ICD-530.85) last EGD a few months ago GERD symptoms well controlled I will increase dexilant temporarily to twice a day and see if that helps her  symptoms  Problem # 6:  we also discussed her diet  time spent w/ patient >> 55 min due to chart review, counseling about diet, we also discussed the DDX for her  CP, explaining her NTG use , etc   Complete Medication List: 1)  Atenolol 25 Mg Tabs (Atenolol) .Marland KitchenMarland KitchenMarland Kitchen  1 by mouth once daily 2)  Benazepril Hcl 10 Mg Tabs (Benazepril hcl) .... 2 by mouth once daily 3)  Trazodone Hcl 50 Mg Tabs (Trazodone hcl) .... 2 by mouth qhs 4)  Ropinirole Hcl 1 Mg Tabs (Ropinirole hcl) .... Take 1 tab by mouth at dinner and 1/2 tab by mouth at bedtime 5)  Topamax 200 Mg Tabs (Topiramate) .... 2 by mouth at bedtime 6)  Dexilant 60 Mg Cpdr (Dexlansoprazole) .... One capsule by mouth once daily 7)  Bayer Aspirin 325 Mg Tabs (Aspirin) .... Qd 8)  Multivitamins Tabs (Multiple vitamin) .... Take 1 tablet by mouth once a day 9)  Hydrocodone-acetaminophen 7.5-500 Mg Tabs (Hydrocodone-acetaminophen) .... Take 1 1/2 tablets by mouth 10)  Flexeril 10 Mg Tabs (Cyclobenzaprine hcl) .... Take 1 tablet by mouth as needed 11)  Vitamin D 1800 Mg Tablet  .... Take 1 1000 mg and 2 400 mg tablets 12)  Estrace 1 Mg Tabs (Estradiol) .[name].. 1 by mouth  daily 13)  B Complex Tabs (B complex vitamins) .... Take 1 tablet by mouth once a day 14)  Nitrostat 0.4 Mg Subl (Nitroglycerin) .... As needed 15)  Calicum, Vitamin D  16)  Lipitor 10 Mg Tabs (Atorvastatin calcium) .... One by mouth at bedtime  Other Orders: Flu Vaccine 23yrs + MEDICARE PATIENTS (Q7619) Administration Flu vaccine - MCR (J0932)  Patient Instructions: 1)  start Lipitor 2)  If chest pain, rest, use a nitroglycerin sublingual every 5 minutes 3 times. If the chest pain continue--- to go to the ER 3)  Call if the chest pain is getting worse and more frequent 4)  temporarily take dexilant twice a day 5)  Keep your appointment with cardiology 6)  Please schedule a follow-up appointment in 2 months.  Prescriptions: LIPITOR 10 MG TABS (ATORVASTATIN CALCIUM) one by mouth at bedtime  #30 x 3   Entered and Authorized by:   Nolon Rod. Chyane Greer MD   Signed by:   Nolon Rod. Braedyn Riggle MD on 01/14/2010   Method used:   Print then Give to Patient   RxID:   [id number]  Flu Vaccine Consent Questions     Do you have a history of severe allergic reactions to this vaccine? no    Any prior history of allergic reactions to egg and/or gelatin? no    Do you have a sensitivity to the preservative Thimersol? no    Do you have a past history of Guillan-Barre Syndrome? no    Do you currently have an acute febrile illness? no    Have you ever had a severe reaction to latex? no    Vaccine information given and explained to patient? yes    Are you currently pregnant? no    Lot Number:[id number]   Exp Date:08/08/2010   Site Given  Right Deltoid IM  Orders Added: 1)  Flu Vaccine 4yrs + MEDICARE PATIENTS [Q2039] 2)  Administration Flu vaccine - MCR [G0008] 3)  Est. Patient Level V [99215] 4)  Hgb [85018]     .[email or web address]  Laboratory Results   Blood Tests      INR: 11.9   (Normal Range: 0.88-1.12   Therap INR: 2.0-3.5)

## 2010-03-10 NOTE — Procedures (Signed)
Summary: EGD--gastritis, HH, Barrett's  EGD   Imported By: Freddy Jaksch 05/24/2007 11:22:03  _____________________________________________________________________  External Attachment:    Type:   Image     Comment:   External Document

## 2010-03-10 NOTE — Assessment & Plan Note (Signed)
Summary: BAD COUGH/STOPPED UP/FEVER/[facility]   Vital Signs:  Patient profile:   65 year old female Height:      65 inches Weight:      232 pounds O2 Sat:      97 % Temp:     994 degrees F oral Pulse rate:   103 / minute BP sitting:   130 / 90  (left arm)  Vitals Entered By: Jeremy Johann CMA (August 30, 2008 12:51 PM) CC: sinus pressure, DRY cough, ear pain, , URI symptoms   History of Present Illness:       This is a 65 year old woman who presents with URI symptoms.  The symptoms began 1 week ago.  The patient complains of nasal congestion, purulent nasal discharge, dry cough, and earache.  Associated symptoms include low-grade fever (<100.5 degrees).  The patient denies fever, fever of 100.5-103 degrees, fever of 103.1-104 degrees, fever to >104 degrees, stiff neck, dyspnea, wheezing, rash, vomiting, diarrhea, use of an antipyretic, and response to antipyretic.  The patient also reports headache.  The patient denies itchy watery eyes, itchy throat, sneezing, seasonal symptoms, response to antihistamine, muscle aches, and severe fatigue.  The patient denies the following risk factors for Strep sinusitis: unilateral facial pain, unilateral nasal discharge, poor response to decongestant, double sickening, tooth pain, Strep exposure, tender adenopathy, and absence of cough.  + teeth hurt.  No otc meds.     Current Medications (verified): 1)  Atenolol 25 Mg Tabs (Atenolol) .[name].. 1 By Mouth Once Daily 2)  Hydrochlorothiazide 25 Mg Tabs (Hydrochlorothiazide) .[name].. 1 By Mouth Once Daily 3)  Benazepril Hcl 10 Mg  Tabs (Benazepril Hcl) .... 2 By Mouth Once Daily 4)  Trazodone Hcl 50 Mg  Tabs (Trazodone Hcl) .... 2 By Mouth Qhs 5)  Ropinirole Hcl 1 Mg  Tabs (Ropinirole Hcl) .[name].. 1 By Mouth Qhs 6)  Topamax 200 Mg Tabs (Topiramate) .... Take One By Mouth At Bedtime 7)  Kapidex 60 Mg  Cpdr (Dexlansoprazole) .... Take One Capsule By Mouth 30 Minutes Before Breakfast 8)  Aspirin 81 Mg Tbec (Aspirin) ....  Take 1 Tablet By Mouth Once A Day 9)  Multivitamins  Tabs (Multiple Vitamin) .... Take 1 Tablet By Mouth Once A Day 10)  Hydrocodone-Acetaminophen 7.5-500 Mg Tabs (Hydrocodone-Acetaminophen) .... Take 2-4 Tablets By Mouth 11)  Flexeril 10 Mg  Tabs (Cyclobenzaprine Hcl) .... Take 1 Tablet By Mouth As Needed 12)  Fosamax 70 Mg Tabs (Alendronate Sodium) .[name].. 1 By Mouth Qwk 13)  Vitamin D 1800 Mg Tablet .... Take 1 1000 Mg and 2 400 Mg Tablets 14)  Levaquin 500 Mg Tabs (Levofloxacin) .[name].. 1 By Mouth Once Daily 15)  Nasacort Aq 55 Mcg/act Aers (Triamcinolone Acetonide(Nasal)) .... 2 Sprays Each Nostril Once Daily 16)  Delsym 30 Mg/22ml Lqcr (Dextromethorphan Polistirex)  Allergies: 1)  ! Pcn 2)  ! * Mucinex Dm 3)  ! Morphine  Past History:  Past medical, surgical, family and social histories (including risk factors) reviewed, and no changes noted (except as noted below).  Past Medical History: Reviewed history from 07/12/2008 and no changes required. Hyperlipidemia Hypertension Osteoporosis (-) cath 12-2002 GASTRITIS, ESOPHAGITIS , BARRETTS ESOPHAGUS-- EGD 05-2007 PERSISTENT DISORDER INITIATING/MAINTAINING SLEEP (ICD-307.42) RESTLESS LEGS SYNDROME (ICD-333.94) BACK PAIN, CHRONIC (ICD-724.5) MENOPAUSE, SURGICAL (ICD-627.4) MIGRAINE HEADACHE  GERD Hiatal hernia Depression  Past Surgical History: Reviewed history from 07/15/2008 and no changes required. Hysterectomy for endometriosis Oophorectomy L5-S1 anterior fusion Bilateral knee arthroscopies Right-shoulder surgery Left-arm surgery Bilateral foot surgeries  Family History: Reviewed history from 07/15/2008 and no changes required. colon ca-- GF,2 uncles breast ca--sister MI--F  age: late?  Family History of Esophageal Cancer: Brother Family History of Diabetes: Aunt Family History of Heart Disease: Father  Social History: Reviewed history from 07/15/2008 and no changes required. Married live by  myself. Has a son  Patient is a former smoker. -stopped 30 years ago Alcohol Use - no Daily Caffeine Use-2 cups daily Illicit Drug Use - no Patient gets regular exercise.  Review of Systems      See HPI  Physical Exam  General:  Well-developed,well-nourished,in no acute distress; alert,appropriate and cooperative throughout examination Ears:  External ear exam shows no significant lesions or deformities.  Otoscopic examination reveals clear canals, tympanic membranes are intact bilaterally without bulging, retraction, inflammation or discharge. Hearing is grossly normal bilaterally. Nose:  L frontal sinus tenderness, L maxillary sinus tenderness, R frontal sinus tenderness, and R maxillary sinus tenderness.   Mouth:  Oral mucosa and oropharynx without lesions or exudates.  Teeth in good repair. Neck:  supple and full ROM.   Lungs:  Normal respiratory effort, chest expands symmetrically. Lungs are clear to auscultation, no crackles or wheezes. Heart:  normal rate and no murmur.   Skin:  Intact without suspicious lesions or rashes Cervical Nodes:  L anterior LN enlarged and L posterior LN enlarged.   Psych:  Cognition and judgment appear intact. Alert and cooperative with normal attention span and concentration. No apparent delusions, illusions, hallucinations   Impression & Recommendations:  Problem # 1:  ACUTE SINUSITIS, UNSPECIFIED (ICD-461.9)  Her updated medication list for this problem includes:    Levaquin 500 Mg Tabs (Levofloxacin) .[name]... 1 by mouth once daily    Nasacort Aq 55 Mcg/act Aers (Triamcinolone acetonide(nasal)) .[name]... 2 sprays each nostril once daily    Delsym 30 Mg/75ml Lqcr (Dextromethorphan polistirex)  Instructed on treatment. Call if symptoms persist or worsen.   Orders: Depo- Medrol 80mg  (J1040) Admin of Therapeutic Inj  intramuscular or subcutaneous (16109)  Complete Medication List: 1)  Atenolol 25 Mg Tabs (Atenolol) .[name].. 1 by mouth once daily 2)  Hydrochlorothiazide 25  Mg Tabs (Hydrochlorothiazide) .[name].. 1 by mouth once daily 3)  Benazepril Hcl 10 Mg Tabs (Benazepril hcl) .... 2 by mouth once daily 4)  Trazodone Hcl 50 Mg Tabs (Trazodone hcl) .... 2 by mouth qhs 5)  Ropinirole Hcl 1 Mg Tabs (Ropinirole hcl) .[name].. 1 by mouth qhs 6)  Topamax 200 Mg Tabs (Topiramate) .... Take one by mouth at bedtime 7)  Kapidex 60 Mg Cpdr (Dexlansoprazole) .... Take one capsule by mouth 30 minutes before breakfast 8)  Aspirin 81 Mg Tbec (Aspirin) .... Take 1 tablet by mouth once a day 9)  Multivitamins Tabs (Multiple vitamin) .... Take 1 tablet by mouth once a day 10)  Hydrocodone-acetaminophen 7.5-500 Mg Tabs (Hydrocodone-acetaminophen) .... Take 2-4 tablets by mouth 11)  Flexeril 10 Mg Tabs (Cyclobenzaprine hcl) .... Take 1 tablet by mouth as needed 12)  Fosamax 70 Mg Tabs (Alendronate sodium) .[name].. 1 by mouth qwk 13)  Vitamin D 1800 Mg Tablet  .... Take 1 1000 mg and 2 400 mg tablets 14)  Levaquin 500 Mg Tabs (Levofloxacin) .[name].. 1 by mouth once daily 15)  Nasacort Aq 55 Mcg/act Aers (Triamcinolone acetonide(nasal)) .... 2 sprays each nostril once daily 16)  Delsym 30 Mg/70ml Lqcr (Dextromethorphan polistirex)  Prescriptions: NASACORT AQ 55 MCG/ACT AERS (TRIAMCINOLONE ACETONIDE(NASAL)) 2 sprays each nostril once daily  #1 x 0  Entered and Authorized by:   Loreen Freud DO   Signed by:   Jeremy Johann CMA on 08/30/2008   Method used:   Historical   RxID:   [id number] LEVAQUIN 500 MG TABS (LEVOFLOXACIN) 1 by mouth once daily  #10 x 0   Entered and Authorized by:   Loreen Freud DO   Signed by:   Loreen Freud DO on 08/30/2008   Method used:   Electronically to        [facility]  [address] [phone]* (retail)       [address] [place], [place]  [address]       Ph: [phone] or [phone]       Fax: [phone]   RxID:   [id number]    Medication Administration  Injection # 1:    Medication: Depo- Medrol 80mg     Diagnosis: ACUTE SINUSITIS,  UNSPECIFIED (ICD-461.9)    Route: IM    Site: RUOQ gluteus    Exp Date: 08/09/2010    Lot #: [id number]    Mfr: pharmacia&upjohn co    Patient tolerated injection without complications    Given by: Jeremy Johann CMA (August 30, 2008 2:13 PM)  Orders Added: 1)  Est. Patient Level III [87564] 2)  Depo- Medrol 80mg  [J1040] 3)  Admin of Therapeutic Inj  intramuscular or subcutaneous [33295]

## 2010-03-10 NOTE — Assessment & Plan Note (Signed)
Summary: 2weeks//tl   Vital Signs:  Patient Profile:   65 Years Old Female Weight:      237.2 pounds Pulse rate:   60 / minute Pulse rhythm:   regular BP sitting:   122 / 80  (left arm)  Vitals Entered By: Shary Decamp (August 15, 2006 8:51 AM)               Chief Complaint:  f/u.  History of Present Illness: follow-up from previous visit. Her blood pressure with the addition of a  ACEi now well controlled when she checks at home  her main complaint today is fatigue described as lack of energy "I just don't feel well" she is unable to elaborate more .       Review of Systems       denies chest pain shortness of breath , denies sadness hopelessness or crying spells . when she starts  to do something she does not develop dyspnea on exertion she just get tired  and cannot go on. very sleepy throughout the day. She lives by herself but she has awake herself because of loud snoring.   Physical Exam  General:     alert and well-developed.   Lungs:     normal respiratory effort, no intercostal retractions, no accessory muscle use, and normal breath sounds.   Heart:     normal rate, regular rhythm, no murmur, and no gallop.   Extremities:     no edema    Impression & Recommendations:  Problem # 1:  HYPERTENSION (ICD-401.9) Assessment: Improved ambulatory blood pressure is better.  Her updated medication list for this problem includes:    Atenolol 25 Mg Tabs (Atenolol) .[name]... 1 by mouth once daily    Hydrochlorothiazide 25 Mg Tabs (Hydrochlorothiazide) .[name]... 1 by mouth once daily    Benazepril Hcl 10 Mg Tabs (Benazepril hcl) .[name]... 2 by mouth once daily  Orders: TLB-BMP (Basic Metabolic Panel-BMET) (80048-METABOL) TLB-CBC Platelet - w/Differential (85025-CBCD) TLB-TSH (Thyroid Stimulating Hormone) (84443-TSH)   Problem # 2:  FATIGUE (ICD-780.79) she has a long history of fatigue, maybe slightly worse w/ the addition of Benazepril  however she is not having  hypotension differential diagnoses include sleep apnea and others like adrenal insufficiency given her recent hypokalemia ( noting that she was on diuretics) her fatigue sounds so profound that I will refer to pulmonary medicine to rule out a sleep apnea . Orders: TLB-CBC Platelet - w/Differential (85025-CBCD) TLB-TSH (Thyroid Stimulating Hormone) (84443-TSH) Pulmonary Referral (Pulmonary)   Problem # 3:  EKGs from recent ER visit to review TWI in a single lead (V2)  Problem # 4:  office visit in four months  Problem # 5:  DIZZINESS (ICD-780.4) still occasional dizziness. Denies hearing loss or tinnitus. Reassess on return to clinic  Problem # 6:  Face to face w/pt. 25 minutes due to chart review and referal.  Medications Added to Medication List This Visit: 1)  Atenolol 25 Mg Tabs (Atenolol) .[name].. 1 by mouth once daily 2)  Hydrochlorothiazide 25 Mg Tabs (Hydrochlorothiazide) .[name].. 1 by mouth once daily 3)  Benazepril Hcl 10 Mg Tabs (Benazepril hcl) .... 2 by mouth once daily

## 2010-03-10 NOTE — Assessment & Plan Note (Signed)
Summary: acute - upset stomach & dirrehea,cbs  Medications Added ROPINIROLE HCL 1 MG  TABS (ROPINIROLE HCL) 1 by mouth qhs CIPRO 500 MG  TABS (CIPROFLOXACIN HCL) 1 by mouth two times a day RANITIDINE HCL 150 MG  TABS (RANITIDINE HCL) 1 by mouth two times a day        Vital Signs:  Patient Profile:   65 Years Old Female Weight:      236 pounds Temp:     99 degrees F oral BP sitting:   120 / 80  Vitals Entered By: Shary Decamp (March 07, 2007 11:16 AM)                 Chief Complaint:  abd pain, diarrhea, and cold sxs off & on x 1 mo.  History of Present Illness:  abd pain since aprox 10-08 lower> upper can't describe pain is "all day every day"   diarrhea on off watery, no blood, no mucus usually 3 days in a row during a week period 3 to 5 BM a day the usual pattern is that after a bout of diarrhea she gets  'cold sxs"  Current Allergies (reviewed today): ! PCN  Past Medical History:    Reviewed history from 12/13/2006 and no changes required:       Hyperlipidemia       Hypertension       Osteoporosis       (-) cath 12-2002  Past Surgical History:    Reviewed history from 12/13/2006 and no changes required:       Hysterectomy for endometriosis       Oophorectomy     Review of Systems      See HPI       no fever abd pain : can't tell if worse w/ food occ post prandial nausea +wt loss but doing a diet GERD well control on protonix (Sx started long after PPIs)   GU      Denies dysuria and hematuria.   Physical Exam  General:     alert, well-developed, and overweight-appearing.   Lungs:     normal respiratory effort, no intercostal retractions, no accessory muscle use, and normal breath sounds.   Heart:     normal rate, regular rhythm, and no murmur.   Abdomen:     soft, no hepatomegaly, and no splenomegaly.  tender w/o mass or rebound, worse at lower abd Extremities:     no edema    Impression & Recommendations:  Problem # 1:   DIARRHEA (ICD-787.91) Diarrhe-abd pain-wt loss (on a diet) Cscope 2-08, neg, Bx neg s/p Total  hyst. for endometriosis UA c/w infex, UCX pending  plan: labs empiric treatment for UTI stool studies re asses in 2 weeks hold protonix (s/e??) Orders: TLB-BMP (Basic Metabolic Panel-BMET) (80048-METABOL) TLB-CBC Platelet - w/Differential (85025-CBCD) TLB-TSH (Thyroid Stimulating Hormone) (84443-TSH) TLB-Hepatic/Liver Function Pnl (80076-HEPATIC) TLB-B12 + Folate Pnl (88416_60630-Z60/FUX) TLB-IBC Pnl(Iron/FE;Transferrin) (83550-IBC) TLB-Amylase (82150-AMYL) TLB-Lipase (83690-LIPASE)   Problem # 2:  ABDOMINAL PAIN, GENERALIZED (ICD-789.07) as above will see gyn soon Orders: TLB-BMP (Basic Metabolic Panel-BMET) (80048-METABOL) TLB-CBC Platelet - w/Differential (85025-CBCD) TLB-TSH (Thyroid Stimulating Hormone) (84443-TSH) TLB-Hepatic/Liver Function Pnl (80076-HEPATIC) TLB-B12 + Folate Pnl (32355_73220-U54/YHC) TLB-IBC Pnl(Iron/FE;Transferrin) (83550-IBC) TLB-Amylase (82150-AMYL) TLB-Lipase (83690-LIPASE)   Problem # 3:  F2F 25 min  Problem # 4:  UTI (ICD-599.0)  Her updated medication list for this problem includes:    Cipro 500 Mg Tabs (Ciprofloxacin hcl) .[name]... 1 by mouth two times a day  Orders: UA  Dipstick w/o Micro [phone]) T-Urine Microscopic (60454-09811) T-Culture, Urine ([id number])   Complete Medication List: 1)  Atenolol 25 Mg Tabs (Atenolol) .[name].. 1 by mouth once daily 2)  Hydrochlorothiazide 25 Mg Tabs (Hydrochlorothiazide) .[name].. 1 by mouth once daily 3)  Benazepril Hcl 10 Mg Tabs (Benazepril hcl) .... 2 by mouth once daily 4)  Trazodone Hcl 50 Mg Tabs (Trazodone hcl) .... 2 by mouth qhs 5)  Ropinirole Hcl 1 Mg Tabs (Ropinirole hcl) .[name].. 1 by mouth qhs 6)  Protonix 40 Mg Tbec (Pantoprazole sodium) .... Qd 7)  Fosamax 70 Mg Tabs (Alendronate sodium) .... Qwk 8)  Topamax 100 Mg Tabs (Topiramate) .... Qd 9)  Asa  10)  Vitamins  11)  Hydrocodone-acetaminophen  10-325 Mg Tabs (Hydrocodone-acetaminophen) .... 2-4 qd 12)  Axert 12.5 Mg Tabs (Almotriptan malate) .... Prn 13)  Meclizine Hcl 12.5 Mg Tabs (Meclizine hcl) .... Prn 14)  Flexeril 10 Mg Tabs (Cyclobenzaprine hcl) .... Prn 15)  Cipro 500 Mg Tabs (Ciprofloxacin hcl) .[name].. 1 by mouth two times a day 16)  Ranitidine Hcl 150 Mg Tabs (Ranitidine hcl) .[name].. 1 by mouth two times a day   Patient Instructions: 1)  Stools for : Cx-- c. diff-  WBC  -hemocult Dx diarrhea 2)  Please schedule a follow-up appointment in 2 weeks. 3)  hold protonix: use ranitidine instead    Prescriptions: RANITIDINE HCL 150 MG  TABS (RANITIDINE HCL) 1 by mouth two times a day  #60 x 0   Entered and Authorized by:   Nolon Rod. Emalynn Clewis MD   Signed by:   Nolon Rod. Pranavi Aure MD on 03/07/2007   Method used:   Print then Give to Patient   RxID:   [id number] CIPRO 500 MG  TABS (CIPROFLOXACIN HCL) 1 by mouth two times a day  #20 x 0   Entered and Authorized by:   Nolon Rod. Jezreel Sisk MD   Signed by:   Nolon Rod. Caysie Minnifield MD on 03/07/2007   Method used:   Print then Give to Patient   RxID:   [id number]  ] Laboratory Results   Urine Tests    Routine Urinalysis   Glucose: negative   (Normal Range: Negative) Bilirubin: negative   (Normal Range: Negative) Ketone: negative   (Normal Range: Negative) Spec. Gravity: 1.020   (Normal Range: 1.003-1.035) Blood: negative   (Normal Range: Negative) pH: 5.0   (Normal Range: 5.0-8.0) Protein: negative   (Normal Range: Negative) Urobilinogen: negative   (Normal Range: 0-1) Nitrite: positive   (Normal Range: Negative) Leukocyte Esterace: negative   (Normal Range: Negative)

## 2010-03-12 NOTE — Cardiovascular Report (Signed)
Summary: Pre-Cath Orders  Pre-Cath Orders   Imported By: Marylou Mccoy 02/06/2010 18:10:19  _____________________________________________________________________  External Attachment:    Type:   Image     Comment:   External Document

## 2010-03-12 NOTE — Letter (Signed)
Summary: Encounter Notice/[facility]  Encounter [facility]   Imported By: Lanelle Bal 01/20/2010 12:08:38  _____________________________________________________________________  External Attachment:    Type:   Image     Comment:   External Document

## 2010-03-12 NOTE — Assessment & Plan Note (Signed)
Summary: f22m/wa  Medications Added BAYER ASPIRIN 325 MG TABS (ASPIRIN) 1 tab once daily VITAMIN D 1000 UNIT TABS (CHOLECALCIFEROL) 1 tab once daily        Visit Type:  1 mo f/u Referring Provider:  n/a Primary Provider:  Willow Ora, MD  CC:  leg cramps when walking says thinks due to Lipitor and says when she elevates her legs this makes it worse....denies any other complaints today.  History of Present Illness: 65 yo WF with history of HTN, hyperlipidemia, former tobacco abuse, GERD and Barrets esophagitis who is here today for follow up. I saw her as a consultation at [facility] 01/08/10. She had a normal echo and normal stress myoview. She continued to have exertional chest pain and resting chest pain. Her CTA of the chest in the hospital showed no PE but there was a hiatal hernia. She had an endoscopy within the last year by Dr. Russella Dar and was told this was ok. To exclude CAD, I arranged a left heart cath. This was performed on 01/28/10. Her coronaries were normal. She continues to have some chest pain but it is much better than it was. It is felt that her chest pain is from GI source.  Her legs ache all day. She recently stopped the Lipitor and it is somewhat better but still not completely resolved. Her legs are sore to touch all over the thighs, calf muscles and over anterior aspect of shins.   Current Medications (verified): 1)  Atenolol 25 Mg Tabs (Atenolol) .[name].. 1 By Mouth Once Daily 2)  Benazepril Hcl 10 Mg  Tabs (Benazepril Hcl) .... 2 By Mouth Once Daily 3)  Trazodone Hcl 50 Mg  Tabs (Trazodone Hcl) .... 2 By Mouth Qhs 4)  Ropinirole Hcl 1 Mg  Tabs (Ropinirole Hcl) .... Take 1 Tab By Mouth At Dinner and 1/2 Tab By Mouth At Bedtime 5)  Topamax 200 Mg Tabs (Topiramate) .... 2 By Mouth At Bedtime 6)  Dexilant 60 Mg Cpdr (Dexlansoprazole) .[name].. 1 By Mouth Two Times A Day. 7)  Bayer Aspirin 325 Mg Tabs (Aspirin) .[name].. 1 Tab Once Daily 8)  Multivitamins  Tabs (Multiple Vitamin)  .... Take 1 Tablet By Mouth Once A Day 9)  Hydrocodone-Acetaminophen 7.5-500 Mg Tabs (Hydrocodone-Acetaminophen) .... Take 1 1/2 Tablets By Mouth 10)  Flexeril 10 Mg  Tabs (Cyclobenzaprine Hcl) .... Take 1 Tablet By Mouth As Needed 11)  Vitamin D 1000 Unit Tabs (Cholecalciferol) .[name].. 1 Tab Once Daily 12)  Estrace 1 Mg Tabs (Estradiol) .[name].. 1 By Mouth Daily 13)  B Complex  Tabs (B Complex Vitamins) .... Take 1 Tablet By Mouth Once A Day 14)  Nitrostat 0.4 Mg Subl (Nitroglycerin) .[name].. 1 Tablet Under Tongue At Onset of Chest Pain; You May Repeat Every 5 Minutes For Up To 3 Doses. 15)  Calcium-Vitamin D 500-200 Mg-Unit Tabs (Calcium-Vitamin D) .[name].. 1 Tab Once Daily 16)  Lipitor 10 Mg Tabs (Atorvastatin Calcium) .... One By Mouth At Bedtime  Allergies: 1)  ! Pcn 2)  ! * Mucinex Dm 3)  ! Morphine  Past History:  Past Medical History: Reviewed history from 01/14/2010 and no changes required. Hyperlipidemia Hypertension Osteoporosis MIGRAINE HEADACHE - topamax  Hiatal hernia Depression (-) cath 12-2002 G!----GERD, GASTRITIS, ESOPHAGITIS, BARRETTS ESOPHAGUS no dysplasia last EGD 3-11, + Barrett's,next 3 years PERSISTENT DISORDER INITIATING/MAINTAINING SLEEP   RLS BACK PAIN, CHRONIC  MENOPAUSE, SURGICAL  01-2010 chest pain, negative stress test, echo EF 55%. See report for other minor abnormalities.  CKs slightly  elevated w/ normal troponins   Social History: Reviewed history from 01/22/2010 and no changes required. not married-divorced live by  herself. Has a son Patient is a former smoker. -stopped 30 years ago Alcohol Use -yes-occasional Daily Caffeine Use-1 week Illicit Drug Use - no Patient gets little regular exercise  Review of Systems       The patient complains of chest pain.  The patient denies fatigue, malaise, fever, weight gain/loss, vision loss, decreased hearing, hoarseness, palpitations, shortness of breath, prolonged cough, wheezing, sleep apnea, coughing up  blood, abdominal pain, blood in stool, nausea, vomiting, diarrhea, heartburn, incontinence, blood in urine, muscle weakness, joint pain, leg swelling, rash, skin lesions, headache, fainting, dizziness, depression, anxiety, enlarged lymph nodes, easy bruising or bleeding, and environmental allergies.    Vital Signs:  Patient profile:   65 year old female Height:      65 inches Weight:      234.50 pounds BMI:     39.16 Pulse rate:   72 / minute Pulse rhythm:   regular BP sitting:   124 / 78  (left arm) Cuff size:   large  Vitals Entered By: Danielle Rankin, CMA (February 16, 2010 2:37 PM)  Physical Exam  General:  General: Well developed, well nourished, NAD Musculoskeletal: Muscle strength 5/5 all ext Psychiatric: Mood and affect normal Neck: No JVD, no carotid bruits, no thyromegaly, no lymphadenopathy. Lungs:Clear bilaterally, no wheezes, rhonci, crackles CV: RRR no murmurs, gallops rubs Abdomen: soft, NT, ND, BS present Extremities: No edema, pulses 2+.    Cardiac Cath  Procedure date:  01/28/2010  Findings:      HEMODYNAMIC FINDINGS:  Central aortic pressure 118/60.  Left ventricular pressure 119/16.  Left ventricular end-diastolic pressure 22.   ANGIOGRAPHIC FINDINGS: 1. The left main coronary artery was a long segment and had no     evidence of disease. 2. The left anterior descending was a large-caliber vessel that     coursed to the apex and gave off a moderate-sized diagonal branch.     This vessel is free of any significant disease. 3. The circumflex artery gave off early small caliber ramus     intermediate branch.  The first obtuse marginal branch was moderate     size that had no disease.  The AV groove circumflex became small in     caliber and had no disease. 4. The right coronary artery is a large dominant vessel with no     evidence of obstructive disease. 5. Left ventricular angiogram was performed in the RAO projection and     showed low normal left  ventricular systolic function with ejection     fraction of 50%.  There was no mitral regurgitation noted.   IMPRESSION: 1. No angiographic evidence of coronary artery disease. 2. Normal left ventricular systolic function.  Impression & Recommendations:  Problem # 1:  CHEST PAIN-PRECORDIAL (ICD-786.51) No evidence of CAD on recent cath. Her chest pain is most likely GI related. She is planning to see Dr. Russella Dar in three weeks. No further cardiac workup.   Her updated medication list for this problem includes:    Atenolol 25 Mg Tabs (Atenolol) .[name]... 1 by mouth once daily    Benazepril Hcl 10 Mg Tabs (Benazepril hcl) .[name]... 2 by mouth once daily    Bayer Aspirin 325 Mg Tabs (Aspirin) .[name]... 1 tab once daily    Nitrostat 0.4 Mg Subl (Nitroglycerin) .[name]... 1 tablet under tongue at onset of chest  pain; you may repeat every 5 minutes for up to 3 doses.  Patient Instructions: 1)  Your physician recommends that you schedule a follow-up appointment as needed.

## 2010-03-12 NOTE — Letter (Signed)
Summary: Cardiac Catheterization Instructions- [facility]  [employer], Main Office  [address]   [place], [place] [address]   Phone: [phone]  Fax: [phone]     01/22/2010 MRN: [id number]  [facility] [address] [place] [address] [place], [place]  [address]  Dear Ms. Zaragosa,   You are scheduled for a Cardiac Catheterization on 01/28/10 with Dr.McAlhany.  Please arrive to the 1st floor of the Heart and Vascular Center at [facility] at 9:00 am  on the day of your procedure. Please do not arrive before 6:30 a.m. Call the Heart and Vascular Center at [phone] if you are unable to make your appointmnet. The Code to get into the parking garage under the building is 0006. Take the elevators to the 1st floor. You must have someone to drive you home. Someone must be with you for the first 24 hours after you arrive home. Please wear clothes that are easy to get on and off and wear slip-on shoes. Do not eat or drink after midnight except water with your medications that morning. Bring all your medications and current insurance cards with you.  _x__ Make sure you take your aspirin.  __x_ You may take ALL of your medications with water that morning.  The usual length of stay after your procedure is 2 to 3 hours. This can vary.  If you have any questions, please call the office at the number listed above.   Whitney Maeola Sarah RN

## 2010-03-12 NOTE — Assessment & Plan Note (Signed)
Summary: eph   Referring Provider:  n/a Primary Provider:  Willow Ora, MD  CC:  post Hosp/ Current Chest pressure and 4/10 scale.  History of Present Illness: 65 yo WF with history of HTN, hyperlipidemia, former tobacco abuse, GERD and Barrets esophagitis who is here today for hospital follow up. I saw her as a consultation at [facility] 01/08/10. She had a normal echo and normal stress myoview. She has continued to have exertional chest pain and resting chest pain. The pain is located in the center of her chest and feels like a pressure and at times is sharp. This makes her her feel dyspneic. This pain is happening every day. She has no near syncope or syncope. Her CTA of the chest in the hospital showed no PE but there was a hiatal hernia. She had an endoscopy within the last year by Dr. Russella Dar and was told this was ok. She does not think this pain in her chest is consistent with GERD. The NTG pills seems to make her chest pain better.   Current Medications (verified): 1)  Atenolol 25 Mg Tabs (Atenolol) .[name].. 1 By Mouth Once Daily 2)  Benazepril Hcl 10 Mg  Tabs (Benazepril Hcl) .... 2 By Mouth Once Daily 3)  Trazodone Hcl 50 Mg  Tabs (Trazodone Hcl) .... 2 By Mouth Qhs 4)  Ropinirole Hcl 1 Mg  Tabs (Ropinirole Hcl) .... Take 1 Tab By Mouth At Dinner and 1/2 Tab By Mouth At Bedtime 5)  Topamax 200 Mg Tabs (Topiramate) .... 2 By Mouth At Bedtime 6)  Dexilant 60 Mg Cpdr (Dexlansoprazole) .... One Capsule By Mouth Once Daily 7)  Bayer Aspirin 325 Mg Tabs (Aspirin) .... Qd 8)  Multivitamins  Tabs (Multiple Vitamin) .... Take 1 Tablet By Mouth Once A Day 9)  Hydrocodone-Acetaminophen 7.5-500 Mg Tabs (Hydrocodone-Acetaminophen) .... Take 1 1/2 Tablets By Mouth 10)  Flexeril 10 Mg  Tabs (Cyclobenzaprine Hcl) .... Take 1 Tablet By Mouth As Needed 11)  Vitamin D 1800 Mg Tablet .... Take 1 1000 Mg and 2 400 Mg Tablets 12)  Estrace 1 Mg Tabs (Estradiol) .[name].. 1 By Mouth Daily 13)  B Complex  Tabs  (B Complex Vitamins) .... Take 1 Tablet By Mouth Once A Day 14)  Nitrostat 0.4 Mg Subl (Nitroglycerin) .[name].. 1 Tablet Under Tongue At Onset of Chest Pain; You May Repeat Every 5 Minutes For Up To 3 Doses. 15)  Calcium-Vitamin D 500-200 Mg-Unit Tabs (Calcium-Vitamin D) .[name].. 1 Tab Once Daily 16)  Lipitor 10 Mg Tabs (Atorvastatin Calcium) .... One By Mouth At Bedtime  Allergies: 1)  ! Pcn 2)  ! * Mucinex Dm 3)  ! Morphine  Past History:  Past Medical History: Reviewed history from 01/14/2010 and no changes required. Hyperlipidemia Hypertension Osteoporosis MIGRAINE HEADACHE - topamax  Hiatal hernia Depression (-) cath 12-2002 G!----GERD, GASTRITIS, ESOPHAGITIS, BARRETTS ESOPHAGUS no dysplasia last EGD 3-11, + Barrett's,next 3 years PERSISTENT DISORDER INITIATING/MAINTAINING SLEEP   RLS BACK PAIN, CHRONIC  MENOPAUSE, SURGICAL  01-2010 chest pain, negative stress test, echo EF 55%. See report for other minor abnormalities. CKs slightly  elevated w/ normal troponins   Past Surgical History: Reviewed history from 07/15/2008 and no changes required. Hysterectomy for endometriosis Oophorectomy L5-S1 anterior fusion Bilateral knee arthroscopies Right-shoulder surgery Left-arm surgery Bilateral foot surgeries  Family History: Reviewed history from 11/03/2009 and no changes required. FH colon ca-- maternal GF, 2 maternal uncles breast ca--sister MI--Father  age: late?  Esophageal Cancer: Brother Diabetes--  Aunt  Spleen CA-Uncle  Father deceased- had an MI at age 80. Mother deceased from lung cancer    Social History: Reviewed history from 11/03/2009 and no changes required. not married-divorced live by  herself. Has a son Patient is a former smoker. -stopped 30 years ago Alcohol Use -yes-occasional Daily Caffeine Use-1 week Illicit Drug Use - no Patient gets little regular exercise  Review of Systems       The patient complains of chest pain and shortness of  breath.  The patient denies fatigue, malaise, fever, weight gain/loss, vision loss, decreased hearing, hoarseness, palpitations, prolonged cough, wheezing, sleep apnea, coughing up blood, abdominal pain, blood in stool, nausea, vomiting, diarrhea, heartburn, incontinence, blood in urine, muscle weakness, joint pain, leg swelling, rash, skin lesions, headache, fainting, dizziness, depression, anxiety, enlarged lymph nodes, easy bruising or bleeding, and environmental allergies.    Vital Signs:  Patient profile:   65 year old female Height:      65 inches Weight:      221 pounds BMI:     36.91 Pulse rate:   64 / minute Pulse rhythm:   regular BP sitting:   118 / 72  (left arm) Cuff size:   regular  Vitals Entered By: Stanton Kidney, EMT-P (January 22, 2010 3:23 PM)  Physical Exam  General:  General: Well developed, well nourished, NAD HEENT: OP clear, mucus membranes moist SKIN: warm, dry Neuro: No focal deficits Musculoskeletal: Muscle strength 5/5 all ext Psychiatric: Mood and affect normal Neck: No JVD, no carotid bruits, no thyromegaly, no lymphadenopathy. Lungs:Clear bilaterally, no wheezes, rhonci, crackles CV: RRR no murmurs, gallops rubs Abdomen: soft, NT, ND, BS present Extremities: No edema, pulses 2+.    EKG  Procedure date:  01/22/2010  Findings:      NSR, rate 64 bpm.   Nuclear Study  Procedure date:  01/09/2010  Findings:      Scintigraphic results:  The images were reconstructed in the short   axis as well as the vertical and horizontal long axis.  There was   no evidence of hypoperfusion in any vascular territory.  The gated   ejection fraction was 66% and the wall motion was normal.  T I D -   1.00.  The end-systolic volume was 26 ml and the end diastolic   volume was 76 ml.    Final interpretation:  Lexiscan Myoview with chest heaviness but no   diagnostic electrocardiographic changes.  The scintigraphic results   show no evidence of ischemia or  infarction in any vascular   territory.  The gated ejection fraction was 66% and the wall motion   was normal.  Echocardiogram  Procedure date:  01/09/2010  Findings:       Left ventricle: The cavity size was normal. Wall thickness was     normal. Systolic function was low normal to mildly reduced. The     estimated ejection fraction was in the range of 50% to 55%.     Although no diagnostic regional wall motion abnormality was     identified, this possibility cannot be completely excluded on the     basis of this study. Doppler parameters are consistent with     abnormal left ventricular relaxation (grade 1 diastolic     dysfunction).   - Aortic valve: There was no stenosis.   - Mitral valve: Trivial regurgitation.   - Left atrium: The atrium was mildly dilated.   - Right ventricle: Mild to moderately dilated RV with mild systolic  dysfunction.   - Right atrium: The atrium was mildly to moderately dilated.   - Tricuspid valve: Mild-moderate regurgitation. Peak RV-RA     gradient:35mm Hg (S).   - Pulmonary arteries: PA peak pressure: 37mm Hg (S).   - Inferior vena cava: The vessel was normal in size; the     respirophasic diameter changes were in the normal range (= 50%);     findings are consistent with normal central venous pressure.  Impression & Recommendations:  Problem # 1:  CHEST PAIN (ICD-786.50) She has had a recent normal stress myoview and normal LV function by echo and nuclear study. She continues to have exertional chest pain and SOB. I cannot exclude CAD without invasive testing. She is willing to proceed with the left heart cath. We will arrange on 01/28/10 in the outpatient cath lab. will check labs today including BMET, CBC, coags.  Risks and benefits reviewed.   Her updated medication list for this problem includes:    Atenolol 25 Mg Tabs (Atenolol) .[name]... 1 by mouth once daily    Benazepril Hcl 10 Mg Tabs (Benazepril hcl) .[name]... 2 by mouth once daily     Bayer Aspirin 325 Mg Tabs (Aspirin) ..... Qd    Nitrostat 0.4 Mg Subl (Nitroglycerin) .[name]... 1 tablet under tongue at onset of chest pain; you may repeat every 5 minutes for up to 3 doses.  Orders: EKG w/ Interpretation (93000) Cardiac Catheterization (Cardiac Cath) TLB-BMP (Basic Metabolic Panel-BMET) (80048-METABOL) TLB-CBC Platelet - w/Differential (85025-CBCD) TLB-PT (Protime) (85610-PTP)  Patient Instructions: 1)  Your physician recommends that you schedule a follow-up appointment in: 3-4 weeks.  2)  Your physician recommends that you continue on your current medications as directed. Please refer to the Current Medication list given to you today. 3)  Your physician has requested that you have a cardiac catheterization.  Cardiac catheterization is used to diagnose and/or treat various heart conditions. Doctors may recommend this procedure for a number of different reasons. The most common reason is to evaluate chest pain. Chest pain can be a symptom of coronary artery disease (CAD), and cardiac catheterization can show whether plaque is narrowing or blocking your heart's arteries. This procedure is also used to evaluate the valves, as well as measure the blood flow and oxygen levels in different parts of your heart.  For further information please visit [email or web address]  Please follow instruction sheet, as given.

## 2010-03-17 ENCOUNTER — Ambulatory Visit: Payer: Self-pay | Admitting: Internal Medicine

## 2010-03-18 NOTE — Assessment & Plan Note (Signed)
Summary: F/U BARRETTS.[name]   History of Present Illness Visit Type: Follow-up Visit Primary GI MD: Elie Goody MD [facility] Primary Provider: Willow Ora, MD Requesting Provider: n/a Chief Complaint: Patient c/o several episodes of intermittent substernal chest pain. She was recently evaluated at the hospital and pain was found to be noncardiac. She has had a few episodes of regurgiation but no nausea. There has been no abdominal pain. History of Present Illness:   This is a 65 year old female with GERD and Barrett's, who has had recurrent problems with chest pain, and shortness of breath, in November and December. She was hospitalized and underwent cardiac catheterization. No cardiopulmonary etiology was found. During December she had several episodes of regurgitation, but prior to this she did not show typical reflux symptoms. She was placed in Dexilant twice daily for the past few weeks and has been asymptomatic. She states she cannot afford to take Dexilant twice daily long-term.   GI Review of Systems      Denies abdominal pain, acid reflux, belching, bloating, chest pain, dysphagia with liquids, dysphagia with solids, heartburn, loss of appetite, nausea, vomiting, vomiting blood, weight loss, and  weight gain.        Denies anal fissure, black tarry stools, change in bowel habit, constipation, diarrhea, diverticulosis, fecal incontinence, heme positive stool, hemorrhoids, irritable bowel syndrome, jaundice, light color stool, liver problems, rectal bleeding, and  rectal pain. Preventive Screening-Counseling & Management  Caffeine-Diet-Exercise     Does Patient Exercise: yes   Current Medications (verified): 1)  Atenolol 25 Mg Tabs (Atenolol) .[name].. 1 By Mouth Once Daily 2)  Benazepril Hcl 10 Mg  Tabs (Benazepril Hcl) .... 2 By Mouth Once Daily 3)  Trazodone Hcl 50 Mg  Tabs (Trazodone Hcl) .... 2 By Mouth Qhs 4)  Ropinirole Hcl 1 Mg  Tabs (Ropinirole Hcl) .... Take 1 Tab By Mouth At  Dinner and 1/2 Tab By Mouth At Bedtime 5)  Topamax 200 Mg Tabs (Topiramate) .... 2 By Mouth At Bedtime 6)  Dexilant 60 Mg Cpdr (Dexlansoprazole) .... Take 1 Tablet By Mouth Once A Day 7)  Bayer Aspirin 325 Mg Tabs (Aspirin) .[name].. 1 Tab Once Daily 8)  Multivitamins  Tabs (Multiple Vitamin) .... Take 1 Tablet By Mouth Once A Day 9)  Hydrocodone-Acetaminophen 7.5-500 Mg Tabs (Hydrocodone-Acetaminophen) .... Take 1 1/2 Tablets By Mouth 10)  Flexeril 10 Mg  Tabs (Cyclobenzaprine Hcl) .... Take 1 Tablet By Mouth As Needed 11)  Vitamin D 1000 Unit Tabs (Cholecalciferol) .[name].. 1 Tab Once Daily 12)  Estrace 1 Mg Tabs (Estradiol) .[name].. 1 By Mouth Daily 13)  B Complex  Tabs (B Complex Vitamins) .... Take 1 Tablet By Mouth Once A Day 14)  Nitrostat 0.4 Mg Subl (Nitroglycerin) .[name].. 1 Tablet Under Tongue At Onset of Chest Pain; You May Repeat Every 5 Minutes For Up To 3 Doses. 15)  Calcium-Vitamin D 500-200 Mg-Unit Tabs (Calcium-Vitamin D) .[name].. 1 Tab Once Daily  Allergies (verified): 1)  ! Pcn 2)  ! * Mucinex Dm 3)  ! Morphine  Past History:  Past Medical History: Reviewed history from 01/14/2010 and no changes required. Hyperlipidemia Hypertension Osteoporosis MIGRAINE HEADACHE - topamax  Hiatal hernia Depression (-) cath 12-2002 G!----GERD, GASTRITIS, ESOPHAGITIS, BARRETTS ESOPHAGUS no dysplasia last EGD 3-11, + Barrett's,next 3 years PERSISTENT DISORDER INITIATING/MAINTAINING SLEEP   RLS BACK PAIN, CHRONIC  MENOPAUSE, SURGICAL  01-2010 chest pain, negative stress test, echo EF 55%. See report for other minor abnormalities. CKs slightly  elevated w/ normal troponins  Past Surgical History: Reviewed history from 07/15/2008 and no changes required. Hysterectomy for endometriosis Oophorectomy L5-S1 anterior fusion Bilateral knee arthroscopies Right-shoulder surgery Left-arm surgery Bilateral foot surgeries  Family History: Reviewed history from 01/22/2010 and no changes required. FH  colon ca-- maternal GF, 2 maternal uncles breast ca--sister MI--Father  age: late?  Esophageal Cancer: Brother Diabetes--  Aunt Spleen CA-Uncle  Father deceased- had an MI at age 42. Mother deceased from lung cancer    Social History: Reviewed history from 01/22/2010 and no changes required. not married-divorced live by  herself. Has a son Patient is a former smoker. -stopped 30 years ago Alcohol Use -yes-occasional Daily Caffeine Use-1 week Illicit Drug Use - no Patient gets regular exercise.  Review of Systems       The patient complains of arthritis/joint pain, back pain, cough, fatigue, muscle pains/cramps, shortness of breath, and sleeping problems.         The pertinent positives and negatives are noted as above and in the HPI. All other ROS were reviewed and were negative.   Vital Signs:  Patient profile:   65 year old female Height:      65 inches Weight:      232.38 pounds BMI:     38.81 BSA:     2.11 Pulse rate:   80 / minute Pulse rhythm:   regular BP sitting:   120 / 82  (left arm)  Vitals Entered By: Lamona Curl CMA Duncan Dull) (March 10, 2010 10:40 AM)  Physical Exam  General:  Well developed, well nourished, no acute distress. Head:  Normocephalic and atraumatic. Eyes:  PERRLA, no icterus. Mouth:  No deformity or lesions, dentition normal. Lungs:  Clear throughout to auscultation. Heart:  Regular rate and rhythm; no murmurs, rubs,  or bruits. Abdomen:  Soft, nontender and nondistended. No masses, hepatosplenomegaly or hernias noted. Normal bowel sounds. Extremities:  No clubbing, cyanosis, edema or deformities noted. Neurologic:  Alert and  oriented x4;  grossly normal neurologically. Psych:  Alert and cooperative. Normal mood and affect.  Impression & Recommendations:  Problem # 1:  CHEST PAIN-PRECORDIAL (ICD-786.51) Chest pain, associated with shortness of breath. Initially, her symptoms were not typical for GERD, however, she developed  regurgitation in December, and her symptoms have been controlled for the past several weeks on an intensified antireflux regimen. Continue Dexilant 60 mg q.a.m. and ranitidine 300 mg q.p.m. along with all standard antireflux measures and 4" beblocks.  Problem # 2:  BARRETTS ESOPHAGUS (ICD-530.85) Surveillance endoscopy March 2014.  Problem # 3:  FAMILY HX COLON CANCER (ICD-V16.0) Elevated risk screening colonoscopy recommended October 2016.  Patient Instructions: 1)  Stay on Dexilant one tablet by mouth every morning and start ranitidine 300mg  one tablet by mouth every evening. A prescription has been sent to your pharmacy.  2)  Avoid foods high in acid content ( tomatoes, citrus juices, spicy foods) . Avoid eating within 3 to 4 hours of lying down or before exercising. Do not over eat; try smaller more frequent meals. Elevate head of bed four inches when sleeping.  3)  Please schedule a follow-up appointment in 3 months. 4)  Copy sent to : Willow Ora, MD 5)  The medication list was reviewed and reconciled.  All changed / newly prescribed medications were explained.  A complete medication list was provided to the patient / caregiver.  Prescriptions: RANITIDINE HCL 300 MG TABS (RANITIDINE HCL) one tablet by mouth every evening  #30 x 11   Entered by:  Christie Nottingham CMA (AAMA)   Authorized by:   Meryl Dare MD [facility]   Signed by:   Christie Nottingham CMA (AAMA) on 03/10/2010   Method used:   Electronically to        [facility] [phone]* (retail)       [address] [place], [place]  [address]       Ph: [phone]       Fax: [phone]   RxID:   [id number] RANITIDINE HCL 300 MG TABS (RANITIDINE HCL) one tablet by mouth every evening  #30 x 11   Entered by:   Christie Nottingham CMA (AAMA)   Authorized by:   Meryl Dare MD [facility]   Signed by:   Christie Nottingham CMA (AAMA) on 03/10/2010   Method used:   Electronically to        [facility] [phone]* (retail)       [address]       [place], [place]  [address]       Ph: [phone] or [phone]       Fax: [phone]   RxID:   [id number]

## 2010-04-02 ENCOUNTER — Encounter: Payer: Self-pay | Admitting: Internal Medicine

## 2010-04-14 ENCOUNTER — Encounter: Payer: Self-pay | Admitting: Internal Medicine

## 2010-04-14 ENCOUNTER — Other Ambulatory Visit: Payer: Self-pay | Admitting: Internal Medicine

## 2010-04-14 ENCOUNTER — Encounter (INDEPENDENT_AMBULATORY_CARE_PROVIDER_SITE_OTHER): Payer: Medicare Other | Admitting: Internal Medicine

## 2010-04-14 DIAGNOSIS — E785 Hyperlipidemia, unspecified: Secondary | ICD-10-CM

## 2010-04-14 DIAGNOSIS — Z23 Encounter for immunization: Secondary | ICD-10-CM

## 2010-04-14 DIAGNOSIS — Z0000 Encounter for general adult medical examination without abnormal findings: Secondary | ICD-10-CM

## 2010-04-14 DIAGNOSIS — I10 Essential (primary) hypertension: Secondary | ICD-10-CM

## 2010-04-15 DIAGNOSIS — E785 Hyperlipidemia, unspecified: Secondary | ICD-10-CM

## 2010-04-15 LAB — LIPID PANEL
Cholesterol: 215 mg/dL — ABNORMAL HIGH (ref 0–200)
HDL: 40.5 mg/dL (ref >39.00)
Total CHOL/HDL Ratio: 5
Triglycerides: 104 mg/dL (ref 0.0–149.0)
VLDL: 20.8 mg/dL (ref 0.0–40.0)

## 2010-04-15 LAB — LDL CHOLESTEROL, DIRECT: Direct LDL: 166 mg/dL

## 2010-04-21 LAB — CBC
HCT: 31.3 % — ABNORMAL LOW (ref 36.0–46.0)
HCT: 31.4 % — ABNORMAL LOW (ref 36.0–46.0)
HCT: 35.2 % — ABNORMAL LOW (ref 36.0–46.0)
Hemoglobin: 10.3 g/dL — ABNORMAL LOW (ref 12.0–15.0)
Hemoglobin: 10.5 g/dL — ABNORMAL LOW (ref 12.0–15.0)
Hemoglobin: 11.8 g/dL — ABNORMAL LOW (ref 12.0–15.0)
MCH: 30.5 pg (ref 26.0–34.0)
MCH: 31.3 pg (ref 26.0–34.0)
MCH: 31.3 pg (ref 26.0–34.0)
MCHC: 32.8 g/dL (ref 30.0–36.0)
MCHC: 33.5 g/dL (ref 30.0–36.0)
MCHC: 33.5 g/dL (ref 30.0–36.0)
MCV: 92.9 fL (ref 78.0–100.0)
MCV: 93.4 fL (ref 78.0–100.0)
MCV: 93.4 fL (ref 78.0–100.0)
Platelets: 168 10*3/uL (ref 150–400)
Platelets: 169 10*3/uL (ref 150–400)
Platelets: 202 10*3/uL (ref 150–400)
RBC: 3.35 MIL/uL — ABNORMAL LOW (ref 3.87–5.11)
RBC: 3.38 MIL/uL — ABNORMAL LOW (ref 3.87–5.11)
RBC: 3.77 MIL/uL — ABNORMAL LOW (ref 3.87–5.11)
RDW: 12.8 % (ref 11.5–15.5)
RDW: 12.9 % (ref 11.5–15.5)
RDW: 12.9 % (ref 11.5–15.5)
WBC: 6.7 10*3/uL (ref 4.0–10.5)
WBC: 6.9 10*3/uL (ref 4.0–10.5)
WBC: 6.9 10*3/uL (ref 4.0–10.5)

## 2010-04-21 LAB — BASIC METABOLIC PANEL
BUN: 19 mg/dL (ref 6–23)
BUN: 21 mg/dL (ref 6–23)
CO2: 24 mEq/L (ref 19–32)
CO2: 24 mEq/L (ref 19–32)
Calcium: 8.6 mg/dL (ref 8.4–10.5)
Calcium: 8.6 mg/dL (ref 8.4–10.5)
Chloride: 112 mEq/L (ref 96–112)
Chloride: 112 mEq/L (ref 96–112)
Creatinine, Ser: 1.12 mg/dL (ref 0.4–1.2)
Creatinine, Ser: 1.17 mg/dL (ref 0.4–1.2)
GFR calc Af Amer: 56 mL/min — ABNORMAL LOW (ref >60)
GFR calc Af Amer: 59 mL/min — ABNORMAL LOW (ref >60)
GFR calc non Af Amer: 46 mL/min — ABNORMAL LOW (ref >60)
GFR calc non Af Amer: 49 mL/min — ABNORMAL LOW (ref >60)
Glucose, Bld: 113 mg/dL — ABNORMAL HIGH (ref 70–99)
Glucose, Bld: 98 mg/dL (ref 70–99)
Potassium: 3.9 mEq/L (ref 3.5–5.1)
Potassium: 4 mEq/L (ref 3.5–5.1)
Sodium: 142 mEq/L (ref 135–145)
Sodium: 143 mEq/L (ref 135–145)

## 2010-04-21 LAB — COMPREHENSIVE METABOLIC PANEL
ALT: 10 U/L (ref 0–35)
AST: 14 U/L (ref 0–37)
Albumin: 3.1 g/dL — ABNORMAL LOW (ref 3.5–5.2)
Alkaline Phosphatase: 64 U/L (ref 39–117)
BUN: 19 mg/dL (ref 6–23)
CO2: 21 mEq/L (ref 19–32)
Calcium: 8.2 mg/dL — ABNORMAL LOW (ref 8.4–10.5)
Chloride: 114 mEq/L — ABNORMAL HIGH (ref 96–112)
Creatinine, Ser: 1.1 mg/dL (ref 0.4–1.2)
GFR calc Af Amer: >60 mL/min (ref >60)
GFR calc non Af Amer: 50 mL/min — ABNORMAL LOW (ref >60)
Glucose, Bld: 102 mg/dL — ABNORMAL HIGH (ref 70–99)
Potassium: 3.4 mEq/L — ABNORMAL LOW (ref 3.5–5.1)
Sodium: 142 mEq/L (ref 135–145)
Total Bilirubin: 0.4 mg/dL (ref 0.3–1.2)
Total Protein: 5.7 g/dL — ABNORMAL LOW (ref 6.0–8.3)

## 2010-04-21 LAB — HEPATIC FUNCTION PANEL
ALT: 12 U/L (ref 0–35)
AST: 16 U/L (ref 0–37)
Albumin: 3.6 g/dL (ref 3.5–5.2)
Alkaline Phosphatase: 71 U/L (ref 39–117)
Bilirubin, Direct: 0.1 mg/dL (ref 0.0–0.3)
Indirect Bilirubin: 0.4 mg/dL (ref 0.3–0.9)
Total Bilirubin: 0.5 mg/dL (ref 0.3–1.2)
Total Protein: 6.8 g/dL (ref 6.0–8.3)

## 2010-04-21 LAB — CARDIAC PANEL(CRET KIN+CKTOT+MB+TROPI)
CK, MB: 1.7 ng/mL (ref 0.3–4.0)
CK, MB: 1.7 ng/mL (ref 0.3–4.0)
Relative Index: 0.4 (ref 0.0–2.5)
Relative Index: 0.4 (ref 0.0–2.5)
Total CK: 409 U/L — ABNORMAL HIGH (ref 7–177)
Total CK: 441 U/L — ABNORMAL HIGH (ref 7–177)
Troponin I: 0.01 ng/mL (ref 0.00–0.06)
Troponin I: 0.01 ng/mL (ref 0.00–0.06)

## 2010-04-21 LAB — DIFFERENTIAL
Basophils Absolute: 0 10*3/uL (ref 0.0–0.1)
Basophils Relative: 0 % (ref 0–1)
Eosinophils Absolute: 0.1 10*3/uL (ref 0.0–0.7)
Eosinophils Relative: 2 % (ref 0–5)
Lymphocytes Relative: 34 % (ref 12–46)
Lymphs Abs: 2.3 10*3/uL (ref 0.7–4.0)
Monocytes Absolute: 0.4 10*3/uL (ref 0.1–1.0)
Monocytes Relative: 6 % (ref 3–12)
Neutro Abs: 3.9 10*3/uL (ref 1.7–7.7)
Neutrophils Relative %: 58 % (ref 43–77)

## 2010-04-21 LAB — LIPID PANEL
Cholesterol: 196 mg/dL (ref 0–200)
HDL: 30 mg/dL — ABNORMAL LOW (ref >39)
LDL Cholesterol: 147 mg/dL — ABNORMAL HIGH (ref 0–99)
Total CHOL/HDL Ratio: 6.5 RATIO
Triglycerides: 95 mg/dL (ref <150)
VLDL: 19 mg/dL (ref 0–40)

## 2010-04-21 LAB — POCT CARDIAC MARKERS
CKMB, poc: 1 ng/mL (ref 1.0–8.0)
CKMB, poc: <1 ng/mL — ABNORMAL LOW (ref 1.0–8.0)
Myoglobin, poc: 71 ng/mL (ref 12–200)
Myoglobin, poc: 82.5 ng/mL (ref 12–200)
Troponin i, poc: <0.05 ng/mL (ref 0.00–0.09)
Troponin i, poc: <0.05 ng/mL (ref 0.00–0.09)

## 2010-04-21 LAB — CK TOTAL AND CKMB (NOT AT ARMC)
CK, MB: 2 ng/mL (ref 0.3–4.0)
Relative Index: 0.4 (ref 0.0–2.5)
Total CK: 483 U/L — ABNORMAL HIGH (ref 7–177)

## 2010-04-21 LAB — IRON AND TIBC
Iron: 68 ug/dL (ref 42–135)
Saturation Ratios: 22 % (ref 20–55)
TIBC: 303 ug/dL (ref 250–470)
UIBC: 235 ug/dL

## 2010-04-21 LAB — TROPONIN I: Troponin I: 0.02 ng/mL (ref 0.00–0.06)

## 2010-04-21 LAB — LIPASE, BLOOD: Lipase: 26 U/L (ref 11–59)

## 2010-04-21 LAB — MAGNESIUM: Magnesium: 1.8 mg/dL (ref 1.5–2.5)

## 2010-04-21 LAB — RETICULOCYTES
RBC.: 3.86 MIL/uL — ABNORMAL LOW (ref 3.87–5.11)
Retic Count, Absolute: 46.3 10*3/uL (ref 19.0–186.0)
Retic Ct Pct: 1.2 % (ref 0.4–3.1)

## 2010-04-21 LAB — TSH: TSH: 1.954 u[IU]/mL (ref 0.350–4.500)

## 2010-04-21 LAB — FERRITIN: Ferritin: 55 ng/mL (ref 10–291)

## 2010-04-21 LAB — VITAMIN B12: Vitamin B-12: 1386 pg/mL — ABNORMAL HIGH (ref 211–911)

## 2010-04-21 LAB — FOLATE: Folate: 17.9 ng/mL

## 2010-04-21 LAB — SEDIMENTATION RATE: Sed Rate: 27 mm/hr — ABNORMAL HIGH (ref 0–22)

## 2010-04-21 LAB — PROTIME-INR
INR: 1.07 (ref 0.00–1.49)
Prothrombin Time: 14.1 seconds (ref 11.6–15.2)

## 2010-04-21 NOTE — Assessment & Plan Note (Signed)
Summary: CPX-PT WILL BE FASTING/[facility]--///sph--pt rsc from rsc 2/23 b...   Vital Signs:  Patient profile:   65 year old female Height:      65 inches Weight:      232.25 pounds Pulse rate:   72 / minute Pulse rhythm:   regular BP sitting:   128 / 84  (left arm) Cuff size:   large  Vitals Entered By: Army Fossa CMA (April 14, 2010 3:02 PM) CC: CPX, fasting  Comments pt states she couldnt take Lipitor- gave leg cramps [facility] [address] [address] due for mammo and pap- has a GYN discuss Tdap and Pneumovax   History of Present Illness: Here for Medicare AWV:  1.   Risk factors based on Past M, S, F history: reviewed  2.   Physical Activities: home chores, trying to start walking but limited by knee pain 3.   Depression/mood: occasionally depressive mood, no suicidal, does not se a need for meds 4.   Hearing: po problems reported or noted , has tinnitus, s/p ENT eval, they rec observation  5.   ADL's: independent  6.   Fall Risk: no recent falls  7.   Home Safety: does feel safe at home  8.   Height, weight, &visual acuity: see VS , to see eye doctor soon 9.   Counseling: yes  10.   Labs ordered based on risk factors: yes  11.           Referral Coordination, if needed 12.           Care Plan, see a/p 13.            Cognitive Assessment: cognition and memory wnl, motor skills limited by pain and deconditioning    since the last office visit, several things happened:  chest pain 01-2010, negative stress test, chest pain persisted --->  negative cardiac catheterization  had an EGD 11-2009, polyps and Barrett's Colonoscopy 10-11 , essentially negative,  next  colonoscopy 2016   in addition, we discussed the following Hyperlipidemia-- on no med, lipitor caused severe leg cramps  Hypertension-- ambulatory BPs usually wnl  Osteoporosis-- need to get a hard copy of her last DEXA MIGRAINE HEADACHE - on  topamax, well controlled    RLS-- symptoms well controlled    Preventive  Screening-Counseling & Management  Alcohol-Tobacco     Alcohol type: wine  Caffeine-Diet-Exercise     Type of exercise: walking   Current Medications (verified): 1)  Atenolol 25 Mg Tabs (Atenolol) .[name].. 1 By Mouth Once Daily 2)  Benazepril Hcl 10 Mg  Tabs (Benazepril Hcl) .... 2 By Mouth Once Daily 3)  Trazodone Hcl 50 Mg  Tabs (Trazodone Hcl) .... 2 By Mouth Qhs 4)  Ropinirole Hcl 1 Mg  Tabs (Ropinirole Hcl) .... Take 1 Tab By Mouth At Dinner and 1/2 Tab By Mouth At Bedtime 5)  Topamax 200 Mg Tabs (Topiramate) .... 2 By Mouth At Bedtime 6)  Dexilant 60 Mg Cpdr (Dexlansoprazole) .... Take 1 Tablet By Mouth Once A Day 7)  Bayer Aspirin 325 Mg Tabs (Aspirin) .[name].. 1 Tab Once Daily 8)  Multivitamins  Tabs (Multiple Vitamin) .... Take 1 Tablet By Mouth Once A Day 9)  Hydrocodone-Acetaminophen 7.5-500 Mg Tabs (Hydrocodone-Acetaminophen) .... Take 1 1/2 Tablets By Mouth 10)  Flexeril 10 Mg  Tabs (Cyclobenzaprine Hcl) .... Take 1 Tablet By Mouth As Needed 11)  Vitamin D 1000 Unit Tabs (Cholecalciferol) .[name].. 1 Tab Once Daily 12)  Estrace 1 Mg Tabs (  Estradiol) .[name].. 1 By Mouth Daily 13)  B Complex  Tabs (B Complex Vitamins) .... Take 1 Tablet By Mouth Once A Day 14)  Nitrostat 0.4 Mg Subl (Nitroglycerin) .[name].. 1 Tablet Under Tongue At Onset of Chest Pain; You May Repeat Every 5 Minutes For Up To 3 Doses. 15)  Calcium-Vitamin D 500-200 Mg-Unit Tabs (Calcium-Vitamin D) .[name].. 1 Tab Once Daily 16)  Ranitidine Hcl 300 Mg Tabs (Ranitidine Hcl) .... One Tablet By Mouth Every Evening  Allergies (verified): 1)  ! Pcn 2)  ! * Mucinex Dm 3)  ! Morphine  Past History:  Past Medical History: Hyperlipidemia Hypertension Osteoporosis MIGRAINE HEADACHE - topamax  Hiatal hernia Depression (-) cath 12-2002 G!----GERD, GASTRITIS, ESOPHAGITIS, BARRETTS ESOPHAGUS no dysplasia last EGD 3-11,  EGD again 10-11+ Barrett's PERSISTENT DISORDER INITIATING/MAINTAINING SLEEP   RLS BACK PAIN, CHRONIC  MENOPAUSE,  SURGICAL  01-2010 chest pain, negative stress test, echo EF 55%. See report for other minor abnormalities. CKs slightly  elevated w/ normal troponins . eventually had a cardiac catheterization: Negative  Past Surgical History: Reviewed history from 07/15/2008 and no changes required. Hysterectomy for endometriosis Oophorectomy L5-S1 anterior fusion Bilateral knee arthroscopies Right-shoulder surgery Left-arm surgery Bilateral foot surgeries  Family History: colon ca-- maternal GF, 2 maternal uncles breast ca--sister Esophageal Cancer: Brother lung ca-- M Diabetes--  Aunt M--F mini strokes--F dementia-- F Spleen CA-Uncle  Father deceased- had an MI at age 36. Mother deceased from lung cancer    Social History: Reviewed history from 03/10/2010 and no changes required. not married-divorced live by  herself. Has a son Patient is a former smoker. -stopped 30 years ago Alcohol Use -yes-occasional Daily Caffeine Use-1 week Illicit Drug Use - no Patient gets regular exercise.  Review of Systems General:  Denies fever and weight loss. CV:  some CP last week, mild . Resp:  Denies cough and shortness of breath. GI:  Denies bloody stools, diarrhea, nausea, and vomiting. GU:  Denies abnormal vaginal bleeding and discharge.  Physical Exam  General:  alert, well-developed, and overweight-appearing.   Neck:  no masses and normal carotid upstroke.   Lungs:  normal respiratory effort, no intercostal retractions, no accessory muscle use, and normal breath sounds.   Heart:  normal rate, regular rhythm, no murmur, and no gallop.   Abdomen:  soft, non-tender, no distention, no masses, no guarding, and no rigidity.   Extremities:  no edema Psych:  Cognition and judgment appear intact. Alert and cooperative with normal attention span and concentration.  not anxious appearing and not depressed appearing.     Impression & Recommendations:  Problem # 1:  HEALTH SCREENING  (ICD-V70.0) chart reviewed Td 2000--  booster today   Pneumonia shot-- today  shingles inmunization  benefits discussed, patient does not like this year  colonoscopy UTD (03-2006, Colonoscopy 10-11 , essentially negative,  next  colonoscopy 2016  gyn-- due  to see them, strongly encouraged to schedule  MMG--  patient report a neg MMG  ~02-2009 , rec to schedule   labs December  2011  total cholesterol 196, LDL 147, HDL 30 TSH 1.9 Creatinine 4.25, potassium 3.9 white count 6.9, hemoglobin 10.5, platelets 168. Iron 68 normal, B12  ~ 1300 normal, folate within normal  counseled about diet and exercise, printed material provided  Orders: Medicare -1st Annual Wellness Visit [phone])  Problem # 2:  CHEST PAIN (ICD-786.50)  better, cardiac catheterization negative, she does have Barrett's  esophagus  Problem # 3:  DIZZINESS (ICD-780.4)  patient reportedly  went to see ENT, she was told  to simply observe.  Problem # 4:  HYPERTENSION (ICD-401.9) at goal  Her updated medication list for this problem includes:    Atenolol 25 Mg Tabs (Atenolol) .[name]... 1 by mouth once daily    Benazepril Hcl 10 Mg Tabs (Benazepril hcl) .[name]... 2 by mouth once daily  BP today: 128/84 Prior BP: 120/82 (03/10/2010)  Labs Reviewed: K+: 4.1 (01/22/2010) Creat: : 1.0 (01/22/2010)   Chol: 210 (04/01/2009)   HDL: 37.80 (04/01/2009)   LDL: 144 (08/04/2008)   TG: 120.0 (04/01/2009)  Problem # 5:  HYPERLIPIDEMIA (ICD-272.4)  01/08/10 total cholesterol 196, LDL 147, HDL 30   LDL goal less than 130. Labs  Labs Reviewed: SGOT: 19 (04/01/2009)   SGPT: 15 (04/01/2009)   HDL:37.80 (04/01/2009), 51 (08/04/2008)  LDL:144 (08/04/2008), DEL (04/09/2008)  Chol:210 (04/01/2009), 222 (08/04/2008)  Trig:120.0 (04/01/2009), 133 (08/04/2008)  Orders: Venipuncture (45409) TLB-Lipid Panel (80061-LIPID) Specimen Handling (81191)  Problem # 6:  OSTEOPOROSIS (ICD-733.00) see note from 06-13-09: we obtained a DEXA report but we  need a hard copy.  patient states she will get a hard copy  Her updated medication list for this problem includes:    Vitamin D 1000 Unit Tabs (Cholecalciferol) .[name]... 1 tab once daily    Calcium-vitamin D 500-200 Mg-unit Tabs (Calcium-vitamin d) .[name]... 1 tab once daily       Complete Medication List: 1)  Atenolol 25 Mg Tabs (Atenolol) .[name].. 1 by mouth once daily 2)  Benazepril Hcl 10 Mg Tabs (Benazepril hcl) .... 2 by mouth once daily 3)  Trazodone Hcl 50 Mg Tabs (Trazodone hcl) .... 2 by mouth qhs 4)  Ropinirole Hcl 1 Mg Tabs (Ropinirole hcl) .... Take 1 tab by mouth at dinner and 1/2 tab by mouth at bedtime 5)  Topamax 200 Mg Tabs (Topiramate) .... 2 by mouth at bedtime 6)  Dexilant 60 Mg Cpdr (Dexlansoprazole) .... Take 1 tablet by mouth once a day 7)  Ranitidine Hcl 300 Mg Tabs (Ranitidine hcl) .... One tablet by mouth every evening 8)  Bayer Aspirin 325 Mg Tabs (Aspirin) .[name].. 1 tab once daily 9)  Multivitamins Tabs (Multiple vitamin) .... Take 1 tablet by mouth once a day 10)  Hydrocodone-acetaminophen 7.5-500 Mg Tabs (Hydrocodone-acetaminophen) .... Take 1 1/2 tablets by mouth 11)  Flexeril 10 Mg Tabs (Cyclobenzaprine hcl) .... Take 1 tablet by mouth as needed 12)  Vitamin D 1000 Unit Tabs (Cholecalciferol) .[name].. 1 tab once daily 13)  Estrace 1 Mg Tabs (Estradiol) .[name].. 1 by mouth daily 14)  B Complex Tabs (B complex vitamins) .... Take 1 tablet by mouth once a day 15)  Nitrostat 0.4 Mg Subl (Nitroglycerin) .[name].. 1 tablet under tongue at onset of chest pain; you may repeat every 5 minutes for up to 3 doses. 16)  Calcium-vitamin D 500-200 Mg-unit Tabs (Calcium-vitamin d) .[name].. 1 tab once daily  Other Orders: Tdap => 71yrs IM (47829) Admin 1st Vaccine (56213) Pneumococcal Vaccine (08657) Admin of Any Addtl Vaccine (84696)  Patient Instructions: 1)  Please schedule a follow-up appointment in 6 months .    Orders Added: 1)  Venipuncture [36415] 2)  TLB-Lipid Panel [80061-LIPID] 3)   Tdap => 75yrs IM [90715] 4)  Admin 1st Vaccine [90471] 5)  Pneumococcal Vaccine [90732] 6)  Admin of Any Addtl Vaccine [90472] 7)  Specimen Handling [99000] 8)  Medicare -1st Annual Wellness Visit [G0438] 9)  Est. Patient Level III [29528]   Immunizations Administered:  Tetanus Vaccine:  Vaccine Type: Tdap    Site: left deltoid    Mfr: GlaxoSmithKline    Dose: 0.5 ml    Route: IM    Given by: Army Fossa CMA    Exp. Date: 11/28/2011    Lot #: [id number]  Pneumonia Vaccine:    Vaccine Type: Pneumovax    Site: right deltoid    Mfr: Merck    Dose: 0.5 ml    Route: IM    Given by: Army Fossa CMA    Exp. Date: 06/10/2011    Lot #: [id number]   Immunizations Administered:  Tetanus Vaccine:    Vaccine Type: Tdap    Site: left deltoid    Mfr: GlaxoSmithKline    Dose: 0.5 ml    Route: IM    Given by: Army Fossa CMA    Exp. Date: 11/28/2011    Lot #: [id number]  Pneumonia Vaccine:    Vaccine Type: Pneumovax    Site: right deltoid    Mfr: Merck    Dose: 0.5 ml    Route: IM    Given by: Army Fossa CMA    Exp. Date: 06/10/2011    Lot #: [id number]   Risk Factors:  Alcohol use:  yes    Type:  wine Exercise:  yes    Type:  walking

## 2010-05-19 ENCOUNTER — Encounter: Payer: Self-pay | Admitting: Pulmonary Disease

## 2010-05-21 ENCOUNTER — Ambulatory Visit (INDEPENDENT_AMBULATORY_CARE_PROVIDER_SITE_OTHER): Payer: Medicare Other | Admitting: Pulmonary Disease

## 2010-05-21 ENCOUNTER — Encounter: Payer: Self-pay | Admitting: Pulmonary Disease

## 2010-05-21 DIAGNOSIS — G4700 Insomnia, unspecified: Secondary | ICD-10-CM

## 2010-05-21 DIAGNOSIS — G2581 Restless legs syndrome: Secondary | ICD-10-CM

## 2010-05-21 NOTE — Progress Notes (Signed)
  Subjective:    Patient ID: [name], female    DOB: Mar 16, 1945, 65 y.o.   MRN: [id number]  HPI The pt comes in today for f/u of her RLS for which she is on requip, and for psychophysiologic insomnia.  She is staying on trazodone, but has slipped somewhat in her sleep hygiene.  She has noticed that her RLS symptoms have started earlier in the day, but at least the requip has controlled them by taking her first dose earlier.    Review of Systems  Constitutional: Negative for fever and unexpected weight change.  HENT: Positive for nosebleeds, congestion, rhinorrhea and sneezing. Negative for ear pain, sore throat, trouble swallowing, dental problem, postnasal drip and sinus pressure.   Eyes: Negative for redness and itching.  Respiratory: Negative for cough, chest tightness, shortness of breath and wheezing.   Cardiovascular: Negative for palpitations and leg swelling.  Gastrointestinal: Negative for nausea and vomiting.  Genitourinary: Negative for dysuria.  Musculoskeletal: Negative for joint swelling.  Skin: Negative for rash.  Neurological: Negative for headaches.  Hematological: Bruises/bleeds easily.  Psychiatric/Behavioral: Negative for dysphoric mood. The patient is not nervous/anxious.        Objective:   Physical Exam Ow female in nad No purulence or discharge seen from nares Chest clear No LE edema or cyanosis  Awake, appears mildly sleepy, moves all 4        Assessment & Plan:

## 2010-05-21 NOTE — Patient Instructions (Addendum)
Ok to stay on trazodone to help with sleep initiation Do not stay in bed if you cannot go to sleep in Get out of bed every day no later than 8am no matter how much you have slept Do not get anywhere near a computer after 10pm Continue current dose of requip, but will consider time released form of medication. followup with me in 3mos

## 2010-05-22 MED ORDER — ROPINIROLE HCL 1 MG PO TABS: ORAL_TABLET | ORAL | Status: DC

## 2010-05-22 MED ORDER — TRAZODONE HCL 50 MG PO TABS: ORAL_TABLET | ORAL | Status: DC

## 2010-05-25 ENCOUNTER — Encounter: Payer: Self-pay | Admitting: Pulmonary Disease

## 2010-05-25 NOTE — Assessment & Plan Note (Signed)
The pt is taking trazodone to help with sleep, but has not stuck to some of the behavioral therapies that we have discussed.  I have reviewed these again with her, and she will work on these more aggressively.

## 2010-05-25 NOTE — Assessment & Plan Note (Signed)
The pt continues to have abnormal leg symptoms at least part of which are due to RLS.  She gets relief with requip, but is starting to have symptoms earlier in the day.  Her iron panel in the past is normal.  One possible solution is to try her on time released dopamine agonist.  I will investigate the dose and cost, and get back with her.

## 2010-05-26 ENCOUNTER — Telehealth: Payer: Self-pay | Admitting: Pulmonary Disease

## 2010-05-26 NOTE — Telephone Encounter (Signed)
error 

## 2010-06-23 NOTE — Assessment & Plan Note (Signed)
[facility]                             PULMONARY OFFICE NOTE   NAME:[name], [name]                        MRN:          [id number]  DATE:09/05/2006                            DOB:          1945-03-28    HISTORY OF PRESENT ILLNESS:  The patient is a very pleasant 65 year old  white female who I have been asked to see for sleeping difficulties.  The patient states that for at least a year she has had very poor sleep  and that she is tired all of the time, even if she sleeps well.  The  patient has been known to have some snoring, but it is unknown whether  she has pauses in her breathing during sleep.  She really does not feel  she has choking arousals.  She typically goes to bed between 9 and 11  and gets up between 8 and 9 to start her day.  Her problem comes with  sleep onset and sleep maintenance.  She states that it may take anywhere  from 30 minutes to 2 hours before she falls asleep.  It is really  unclear to her why this occurs.  She will often watch TV in bed until  she can fall asleep.  She admits that she has a fear of the dark and  that watching TV sometimes calms her. She also has night lights on all  through the house.  The patient states that if she does fall asleep  quickly, she awakens at least 2 to 4 times a night.  One night out of 2  weeks she may sleep all through the night without awakening, but the  other ones she feels that she awakens and often takes greater than an  hour or more to go back to sleep.  She definitely has a sense of  frustration about going to sleep and feels this definitely makes her  problem worse.  She notes significant daytime sleepiness because she is  not sleeping at night.  The patient does admit that she use to work 7  p.m. to 2 a.m. shift for 15 years and recently quit doing this.  In  terms of her daytime habits, she does not drink coffee in the mornings,  but does drink 2 to 3 glasses of decaffeinated  tea a day. She also  started drinking approximately 2 sodas that were caffeinated a day in  order to try to help her stay awake.  The patient also admits to have  severe back pain, which sometimes will disrupt her sleep.   PAST MEDICAL HISTORY:  Is significant for:  1. Hypertension.  2. Allergic rhinitis.  3. History of chronic migraines.  4. History of back surgery in the past.  5. Status post hysterectomy.  6. Status post multiple orthopedic procedures.   CURRENT MEDICATIONS:  Include:  1. Hydrochlorothiazide 25 mg daily.  2. Enteric coated aspirin 81 mg daily.  3. Atenolol 25 mg daily.  4. Benazepril 10 mg 2 daily.  5. Protonix 40 mg daily.  6. Topamax 100 mg daily.  7. Fosamax  70 mg weekly.  8. Multivitamins daily.  9. Also cyclobenzaprine, meclizine and Axert p.r.n.   The patient has no known drug allergies.   SOCIAL HISTORY:  She is divorced but does have children.  She has a  history of smoking 1 pack per day for 5 to 10 years.  She has not smoked  in 30 years.   FAMILY HISTORY:  Is remarkable for her father having heart disease,  mother having bladder cancer.  She had a sister who had breast cancer  and a brother with throat cancer.   REVIEW OF SYSTEMS:  As per history of present illness.  Also see patient  intake form documented in the chart.   PHYSICAL EXAMINATION:  GENERAL:  She is an obese female in no acute  distress.  Blood pressure is 112/70, pulse 73.  Temperature is 98.3.  Weight is 241  pounds.  O2 saturation on room air is 97%.  HEENT:  Pupils equal, round and reactive to light and accommodation.  Extraocular muscles are intact.  Nares are patent without discharge, but  there is mild deviation to the right.  Oropharynx does show mild  elongation of soft palate and uvula.  NECK:  Supple without JVD or lymphadenopathy. There is no palpable  thyromegaly.  CHEST:  Totally clear.  CARDIAC:  Regular rate and rhythm. No murmurs, rubs or gallops.  ABDOMEN:   Soft, nontender with good bowel sounds.  GENITAL EXAM, RECTAL EXAM, BREAST EXAM:  Was not done and not indicated.  LOWER EXTREMITIES:  Shows trace edema.  Pulses were intact distally.  NEUROLOGICALLY:  Alert and oriented with no obvious motor deficits.   IMPRESSION:  Psychophysiologic insomnia. The patient clearly has  developed a sense of frustration about going to sleep and I think this  is propagating her sleep onset and sleep maintenance insomnia.  It is  unclear how much her fear of the dark is contributing to this issue.  She obviously has developed poor sleep hygiene in order to try and  compensate for her insomnia.  It is really unclear whether she may have  a concomitant sleep disorder such as sleep disordered breathing. Until  we can get her sleeping through the night, a sleep study would really be  a waste of both time and money in terms of diagnosis.   PLAN:  1. I have had a long discussion with her about stimulus control      therapy.  This will involve not staying in bed if she is not able      to initiate or maintain sleep and to come out to the family room      and doing something inactive.  She is to return to the bedroom once      she is able to reinitiate sleep.  I have gone over this in great      detail with her and she understands completely.  2. I have asked her not to watch TV in bed and to do nothing but sleep      there.  3. I have asked the patient to have no napping during the day,      especially in the early evenings when she has a tendency to doze.  4. I have asked the patient to have no caffeine after 10 a.m. each      day.  5. Will give the patient trazodone 50 mg nightly along with Rozerem 8      mg nightly for the  next 2 weeks to try and help consolidate her      sleep and break her frustration.  She will do this at the same time      that she is participating in stimulus control therapy.  6. If we can finally get the patient sleeping through the  night and      she continues to have fatigue and daytime sleepiness, then I would      recommend nocturnal polysomnography to      rule out sleep apnea in light of her obesity and abnormal upper      airway anatomy.  7. The patient will follow up in 4 weeks or sooner if there are      problems.     Barbaraann Share, MD,FCCP  Electronically Signed    [facility]/MedQ  DD: 09/06/2006  DT: 09/06/2006  Job #: [id number]   cc:   Willow Ora, MD

## 2010-06-23 NOTE — Assessment & Plan Note (Signed)
[name]                         GASTROENTEROLOGY OFFICE NOTE   [name], [name]                        MRN:          [id number]  DATE:06/15/2007                            DOB:          31-Mar-1945    This is a return office visit for GERD.  Since beginning Kapidex, her  reflux symptoms have come under better control.  She still has frequent  cough that has not improved.  She has no dysphagia, odynophagia, nausea  or vomiting.  Barrett's esophagus without dysplasia was noted at her  recent endoscopy.   CURRENT MEDICATIONS:  Listed on the chart, updated and reviewed.   MEDICATION ALLERGIES:  PENICILLIN, leading to a rash.  MORPHINE.   EXAM:  Overweight white female, no acute distress.  Weight 232 pounds, blood pressure 124/78, pulse 72 and regular.  CHEST:  Clear to auscultation bilaterally.  CARDIAC:  Regular rate and rhythm without murmurs appreciated.  ABDOMEN:  Soft and nontender with normoactive bowel sounds.   ASSESSMENT AND PLAN:  1. Gastroesophageal reflux disease with Barrett's esophagus.  Maintain      standard antireflux measures and Kapidex 60 mg p.o. q.a.m.      Surveillance endoscopy in April 2012.  We had a long discussion      about GERD, esophagitis, Barrett's esophagus, the long-term risks      and management strategies.  2. Family history of colon cancer.  Recall screening colonoscopy for      February 2013.     Venita Lick. Russella Dar, MD, [facility]  Electronically Signed    MTS/MedQ  DD: 06/16/2007  DT: 06/16/2007  Job #: [id number]   cc:   Willow Ora, MD

## 2010-06-23 NOTE — Procedures (Signed)
[name], [name]                 ACCOUNT NO.:  [id number]   MEDICAL RECORD NO.:  [id number]          PATIENT TYPE:  OUT   LOCATION:  SLEEP CENTER                 FACILITY:  [facility]   PHYSICIAN:  Barbaraann Share, MD,FCCPDATE OF BIRTH:  08/13/1945   DATE OF STUDY:  10/25/2006                            NOCTURNAL POLYSOMNOGRAM   REFERRING PHYSICIAN:  Barbaraann Share, MD,FCCP   INDICATION FOR STUDY:  Hypersomnia with sleep apnea.   EPWORTH SLEEPINESS SCORE:  15   MEDICATIONS:   SLEEP ARCHITECTURE:  Patient had a total sleep time of 310 minutes with  very little slow-wave sleep or REM.  Sleep onset latency was prolonged  at 44 minutes and REM onset was prolonged at 250 minutes.  Sleep  efficiency was very poor at 65%.   RESPIRATORY DATA:  Patient was found to have 14 obstructive hypopneas  and no apneas for an apnea/hypopnea index of 3 events per hour.  The  patient did not have any supine sleep and the events were more common  during REM.  There was moderate to loud intermittent snoring noted  throughout.   OXYGEN DATA:  There was O2 desaturation as low as 91% with the patient's  obstructive events.   CARDIAC DATA:  No clinically significant cardiac arrhythmias were noted.   MOVEMENT/PARASOMNIA:  The patient was noted to have 20 leg jerks with 4  per hour, resulting in arousal or awakening.  There were no abnormal  behaviors noted.   IMPRESSIONS/RECOMMENDATIONS:  1. Small numbers of obstructive events which do not meet the      apnea/hypopnea index criteria for the obstructive sleep apnea      syndrome.  Patient did have very little slow-wave sleep or REM and,      therefore, her degree of sleep apnea may be underestimated.  There      was moderate to loud intermittent snoring noted throughout.  2. Small numbers of leg jerks with what appears to be significant      sleep disruption.  Clinical correlation is suggested to consider a      primary movement disorder of  sleep.      Barbaraann Share, MD,FCCP  Diplomate, American Board of Sleep  Medicine  Electronically Signed     [facility]/MEDQ  D:  11/09/2006 08:52:44  T:  11/09/2006 11:58:30  Job:  [id number]

## 2010-06-23 NOTE — Assessment & Plan Note (Signed)
[facility]                         GASTROENTEROLOGY OFFICE NOTE   Alexa, [name]                        MRN:          [id number]  DATE:05/12/2007                            DOB:          [birth year]    REFERRING PHYSICIAN:  Willow Ora, MD   REASON FOR CONSULTATION:  Epigastric pain and nausea.   HISTORY OF PRESENT ILLNESS:  [name] is a 65 year old white female  that I have previously evaluated.  She has a strong family history of  colon cancer and last underwent colonoscopy in February of 2008.  The  only finding was a suspected ascending colon lipoma.  She has a long  history of GERD and underwent upper endoscopy in December of 1999.  A  small sliding hiatal hernia was noted.  She relates worsening epigastric  pain with severe morning nausea over the past five months.  She was  treated with Protonix and she may have had an exacerbation of her  chronic intermittent diarrhea, so this was discontinued and ranitidine  was started.  She relates no heartburn, indigestion, dysphagia or  odynophagia.  Fosamax was recently discontinued and she feels that her  symptoms improved, although she does have persistent symptoms.  She  underwent a CT scan of the abdomen and pelvis, which showed a short  segment of mucosal thickening of the small bowel in the left abdomen,  evidence of an anterior fusion at L5-S1, and a previous hysterectomy.  A  subsequent CT enterography showed resolved thickening of the loop of  small bowel, suggesting that the thickening was due to a self-limited  process or peristalsis.  She notes no weight-loss, melena, hematochezia,  vomiting or change in bowel habits.   PAST MEDICAL HISTORY:  hypertension  hyperlipidemia  arthritis  depression  migraine headaches   PAST SURGICAL HISTORY:  status post hysterectomy  status post L5-S1 anterior fusion  status post bilateral knee arthroscopies  status post right-shoulder surgery  status post left-arm surgery  status post bilateral foot surgeries   CURRENT MEDICATIONS:  Listed on the chart, updated and reviewed.   MEDICATION ALLERGIES:  PENICILLIN and MORPHINE.   SOCIAL HISTORY:  Per the handwritten form.   REVIEW OF SYSTEMS:  Per the handwritten form.   PHYSICAL EXAM:  Obese white female, in no acute distress.  Height 5 feet 6 inches, weight 230 pounds.  Blood pressure is 108/70,  pulse 88 and regular.  HEENT EXAM:  Anicteric sclerae.  Oropharynx clear.  CHEST:  Clear to auscultation bilaterally.  CARDIAC:  Regular rate and rhythm without murmurs appreciated.  ABDOMEN:  Soft with mild to moderate epigastric tenderness to deep  palpation, no rebound or guarding.  No palpable organomegaly, masses or  hernias.  Normoactive bowel sounds.  EXTREMITIES:  Without clubbing, cyanosis or edema.  NEUROLOGIC:  Alert and oriented times three.  Grossly nonfocal.   ASSESSMENT AND PLAN:  1. Epigastric pain and nausea.  Rule out Fosamax side effect, GERD      with erosive esophagitis, ulcer disease and other disorders.  Begin      all standard antireflux  measures.  Schedule upper endoscopy.      Risks, benefits, and alternatives to upper endoscopy with possible      biopsy discussed with the patient and she consents to proceed.      This will be scheduled electively.  2. Strong family history of colon cancer in a maternal grandfather and      two maternal uncles.  Surveillance colonoscopy recommended for      February 2013.     Venita Lick. Russella Dar, MD, [facility]  Electronically Signed    MTS/MedQ  DD: 05/12/2007  DT: 05/12/2007  Job #: [id number]

## 2010-06-26 NOTE — Discharge Summary (Signed)
NAMEDESERI, LOSS                 ACCOUNT NO.:  [id number]   MEDICAL RECORD NO.:  [id number]          PATIENT TYPE:  INP   LOCATION:  5018                         FACILITY:  [facility]   PHYSICIAN:  Lianne Cure, P.A.  DATE OF BIRTH:  12/26/1945   DATE OF ADMISSION:  12/07/2005  DATE OF DISCHARGE:  12/10/2005                                 DISCHARGE SUMMARY   ADMITTING DIAGNOSES:  1. Axial lumbar back pain.  2. Hypertension.  3. Acid reflux.  4. Migraine headaches.   DISCHARGE DIAGNOSES:  1. L5-S1 ProDisk replacement.  2. Hypertension.  3. Acid reflux.  4. Migraines.   CURRENT MEDICATIONS:  Include:  1. Atenolol 25 mg once daily.  2. HCTZ 25 mg once daily.  3. Hydrocodone 7.5/500 up to 1 every 4-6 hours as needed for pain.  4. Topamax 25 mg 3 tablets at bedtime.   BRIEF HISTORY OF HER STAY:  On December 07, 2005 she was taken to the OR by  Dr. Nelda Severe for anterior lumbar ProDisk replacement at L5-S1.  Status  post surgery, she was stable.  Postoperative check, she was alert, moving  both lower extremities freely, grossly neurovascularly intact, motor intact  and equal bilaterally.  No numbness in L4-S1 distally.  She was taken to  5000.   Post op day 1, on December 08, 2005, vital signs were stable, her temperature  was 99, hemoglobin was 86, white blood cell count 10.2.  She did have some  blood loss during surgery; I cannot locate the exact amount in the chart at  this moment.  She was watched closely.  She had SCD's that were in place and  calves were soft and nontender.  She had active range of motion of bilateral  lower extremities, grossly neurovascular and motor intact.  We progressed  her diet as tolerate, discontinued the Foley, slowed down the IV rate.   Post op day 2, her hemoglobin was 8.2, white blood cell count was 6.9, lytes  were in normal limits.  Temperature was not recorded in the computer on that  day.  PT came by to start her.  She sat in a  bedside chair twice for 30  minutes without difficulty.  She has postoperative anemia.  We want to PT to  work with her further for mobilization and ambulation.   Post op day 3:  She was afebrile, vital signs were stable, hemoglobin was  down to 7.4--this is December 10, 2005 as the date.  White count is 6.7.  We  ordered to type and cross and transfuse 2 units of packed red blood cells.  She had report of lightheadedness, feeling weak while walking.  Incision  site looks excellent.  No abnormal erythema, no active drainage.  Dressing  was discontinued.  She may shower at this point, and we will see how she  does have she gets a blood transfusion, with Physical Therapy, and she was  stable at that point and discharged home.   PLAN:  Discharge home, use of a rolling walker, 3-in-1 toilet for assistance  at home,  regular diet.  She is going to follow up in the office in 2-4  weeks.  No dressing is necessary, no brace is necessary.  We went her home  with Norco 10/325 one to two every 4 hours p.r.n. for pain control.  We also  placed her on ferrous sulfate 3x daily for 1 month and a stool softener as  needed.  She was given instructions to call our office if she had increased  temperature or swelling or drainage around the incision site, and she was  given a dose of Aranesp 60 mcg sub q. once for substitution for an order for  erythropoietin injection prior to discharge home.  She is to going to  continue all her home medications that she was taking prior to being  admitted to the hospital.      Lianne Cure, P.A.     MC/MEDQ  D:  12/28/2005  T:  12/29/2005  Job:  [id number]

## 2010-06-26 NOTE — Cardiovascular Report (Signed)
NAME:  [name], ORF NO.:  [id number]   MEDICAL RECORD NO.:  [id number]                   PATIENT TYPE:  OIB   LOCATION:  2864                                 FACILITY:  [facility]   PHYSICIAN:  Jonelle Sidle, M.D. [facility]        DATE OF BIRTH:  Jun 26, 1945   DATE OF PROCEDURE:  01/02/2003  DATE OF DISCHARGE:                              CARDIAC CATHETERIZATION   PRIMARY CARE PHYSICIAN:  Wanda Plump, MD [facility]   [place] CARDIOLOGIST:  Learta Codding, M.D.   INDICATIONS:  [name] is a 65 year old woman with a history of  hypertension and previous tobacco use.  She has had complaints of atypical  chest discomfort and was referred for a Cardiolite which was performed on  9th of this month and revealed diaphragmatic attenuation with a small area  of possible anterolateral ischemia.  She is referred now for definitive  coronary angiography.   PROCEDURES PERFORMED:  1. Left heart catheterization.  2. Selective coronary angiography.  3. Left ventriculography.   ACCESS AND EQUIPMENT:  The area about the right femoral artery was  anesthetized with 1% lidocaine and a 6-French sheath was placed in the right  femoral artery via the modified Seldinger technique.  Standard preformed 6-  [place] and JR4 catheters were used for selective coronary angiography  and an angled pigtail catheter was used for left heart catheterization, left  ventriculography.  All exchanges were made over a wire and the patient  tolerated the procedure well without immediate complications.   HEMODYNAMICS:  1. Left ventricle 108/16.  2. Aorta 108/62.   ANGIOGRAPHIC FINDINGS:  1. The left main coronary artery is free of significant flow limiting     coronary atherosclerosis.  2. The left anterior descending is a large caliber vessel with a large     bifurcating diagonal branch.  No significant flow limiting coronary     atherosclerosis is noted.  3. There is a small to medium  caliber ramus intermedius branch with no     significant flow limiting coronary atherosclerosis.  4. The circumflex coronary artery is a large vessel with a large     trifurcating obtuse marginal branch.  No significant flow limiting     coronary atherosclerosis noted within this system.  5. The right coronary artery is a large, dominant vessel.  There is no     significant flow limiting coronary atherosclerosis noted.   LEFT VENTRICULOGRAPHY:  Left ventriculography was performed in the RAO  projection revealing an ejection fraction of 55-60% with no focal wall  motion abnormalities and no significant mitral regurgitation.   DIAGNOSES:  1. No significant flow limiting coronary atherosclerosis noted within the     major epicardial vessels.  2. Left ventricular ejection fraction of 55-60% without significant mitral     regurgitation.   RECOMMENDATIONS:  I have discussed the results with Wanda Plump, MD [facility].  Would suggest  continued risk factor modification in evaluation for other  potential causes of chest pain.                                               Jonelle Sidle, M.D. [facility]    SGM/MEDQ  D:  01/02/2003  T:  01/02/2003  Job:  [id number]

## 2010-06-26 NOTE — H&P (Signed)
[name], [name]                 ACCOUNT NO.:  [id number]   MEDICAL RECORD NO.:  [id number]          PATIENT TYPE:  AMB   LOCATION:  SDS                          FACILITY:  [facility]   PHYSICIAN:  Nelda Severe, MD      DATE OF BIRTH:  01/14/1946   DATE OF ADMISSION:  12/07/2005  DATE OF DISCHARGE:                                HISTORY & PHYSICAL   This lady is admitted for management of chronic severe, disabling low back  pain.  She has had positive diskography at L5-S1, negative pain studies  above that level.  The intention is to perform an L5-S1 total disk  replacement, Prodisc.   The informed consent has been carried out in the office.  General  complications have been discussed as follows:  Death, heart attack, stroke,  phlebitis in leg or pelvic veins, pulmonary embolus, pneumonia, bleeding  with the need for transfusion with a very small risk of HIV, hepatitis or  transfusion reaction, infection.   Specific risks have been discussed as follows:  Incisional hernia,  sympathectomy with permanent warmth of one of lower extremities, vascular  laceration with the need for ligation, possible permanent swelling in one of  the lower extremities, loosening or breakage of the implant with need for  revision surgery, the inability to perform the disk replacement with the  need for switching of the procedure to an anterior interbody fusion at L5-  S1, the need for further surgery in the future at the same or different  levels.  Should the disk replacement fail in the future, the fallback  position is to a posterior fusion with pedicle screws.   It is planned today to do the operation through a right retroperitoneal  approach to preserve the virgin status of the left side should surgeries be  required anteriorly at the levels above.      Nelda Severe, MD  Electronically Signed     MT/MEDQ  D:  12/07/2005  T:  12/07/2005  Job:  [id number]

## 2010-06-26 NOTE — Op Note (Signed)
NAME:  [name], [name] NO.:  [id number]   MEDICAL RECORD NO.:  [id number]          PATIENT TYPE:  OIB   LOCATION:  5018                         FACILITY:  [facility]   PHYSICIAN:  Balinda Quails, M.D.    DATE OF BIRTH:  02-Mar-1945   DATE OF PROCEDURE:  12/07/2005  DATE OF DISCHARGE:                               OPERATIVE REPORT   COSURGEONS:  P Bud Face, MD and Charlsie Quest, MD.   ANESTHETIC:  General endotracheal.   ANESTHESIOLOGIST:  Sheldon Silvan, M.D.   PREOPERATIVE DIAGNOSIS:  L5-S1 degenerative disk disease.   POSTOPERATIVE DIAGNOSIS:  L5-S1 degenerative disk disease.   PROCEDURE:  L5-S1 total disk replacement (Prodisc).   CLINICAL NOTE:  [name] is a 65 year old female with a history of  chronic back pain.  She has L5-S1 degenerative disk disease.  She was  seen in consultation by Dr. Alveda Reasons for chronic back pain and felt to be a  good candidate for an L5-S1 total disk replacement with Prodisc.   The patient was seen preoperatively in the holding area prior to the  administration of any sedation.  Risks of the operative procedure  explained to the patient in detail.  Potential operative risks for this  anterior lumbar approach includes the major vessel injury, ureter  injury, infection, bleeding, death, MI, stroke and deep venous  thrombosis.  Potential risks of transfusion were discussed also.   The patient consented for surgery understanding these risks.   OPERATIVE PROCEDURE:  The patient brought to the operating room in  stable hemodynamic condition.  General endotracheal anesthesia induced.  Central venous catheter inserted.  An arterial line was attempted by the  anesthesia staff however this was unsuccessful.  The abdomen was prepped  and draped in a sterile fashion.   Due to the potential for further retroperitoneal surgery, a right  retroperitoneal approach was chosen.  A transverse skin incision made in  the right lower quadrant  from the midline to the lateral margin of the  rectus sheath at the L5-S1 surface level.  Subcutaneous tissue divided  with electrocautery.  The fascia was exposed beyond the skin edges.  The  anterior rectus sheath opened from the midline over to the external  oblique aponeurosis.  The anterior rectus sheath was then mobilized  superiorly and inferiorly.  The muscle mobilized posteriorly.   Retroperitoneal space was then entered along in the left lower quadrant  inferior to the semilunar line.  The peritoneum was freed up off the  posterior rectus sheath.  The posterior rectus sheath incised.  The  psoas muscle identified.  The sacral promontory located.  The right  ureter was reflected anteriorly along with the abdominal contents.  The  L5-S1 disk space was identified.  This was freed.  The middle sacral  vessels were cauterized with the bipolar cautery.  The L5-S1 disk  cleared along completely to the right and left.   There was some bleeding deep in the pelvis.  This was venous bleeding,  and easily controlled with packing and Gelfoam.   The L5-S1 disk was  clearly identified with a with a spinal needle.  Using a constant Brau retractor the L5-S1 disk space was fully exposed.   Dr. Alveda Reasons then completed the total disk replacement at L5-S1 with a  Prodisc.   At completion of this retractors were removed.  Sponges all removed.  There was no significant ongoing bleeding.   The posterior rectus sheath was quite attenuated and was not closed.  Anterior rectus sheath was closed 0 Vicryl suture.  Subcutaneous tissue  closed with running 2-0 Vicryl suture in deep subcutaneous layer,  running 3-0 Vicryl suture in the superficial subcutaneous layer.  Skin  closed with 4-0 Vicryl.  Steri-Strips applied.  Sterile dressing  applied.   The patient tolerated procedure well.  There were no apparent  complications.  Transferred recovery room in stable condition.      Balinda Quails, M.D.   Electronically Signed     PGH/MEDQ  D:  12/07/2005  T:  12/08/2005  Job:  [id number]

## 2010-06-26 NOTE — Op Note (Signed)
[name], [name]                 ACCOUNT NO.:  [id number]   MEDICAL RECORD NO.:  [id number]          PATIENT TYPE:  AMB   LOCATION:  SDS                          FACILITY:  [facility]   PHYSICIAN:  Nelda Severe, MD      DATE OF BIRTH:  [birth year]   DATE OF PROCEDURE:  12/07/2005  DATE OF DISCHARGE:                                 OPERATIVE REPORT   SURGEON:  Nelda Severe, M.D.  [name], M.D.   ASSISTANT:  Lianne Cure, P.A.-C.   PREOPERATIVE DIAGNOSIS:  Lumbar disc degeneration with annular tearing.   POSTOPERATIVE DIAGNOSIS:  Lumbar disc degeneration with annular tearing.   OPERATIVE PROCEDURE:  Total disc replacement L5-S1 - Prodisk (Synthes) 11  degrees lordosis, 10 mm, medium.   The patient was initially seen by Drs. Madilyn Fireman and [place] in the preoperative  area.  She was also assessed by Dr. Ivin Booty, the anesthesiologist.  She was  placed under general endotracheal anesthesia and a central line instituted  by the anesthesia team.  A Foley catheter was placed in the bladder.  Sequential compression devices were placed on both lower extremities.  The  pulse oximeter was placed on the right great toe as this was the side of the  approach.  Intravenous vancomycin had been infused earlier.  She was  positioned supine on the Sterling radiolucent table with the hips and knees  gently flexed and supported by pillows.  The arms were folded across her  chest and well padded with foam and secured with tape.   The abdomen was prepped with DuraPrep and draped in a sterile fashion.  Prior to placing drapes and prior to preparation, a scout view, AP and  lateral, was done with the C-arm fluoroscopic unit to ensure that we could  get technically adequate pictures.  As well, we used a radiopaque Allis  clamp to superimpose over the x-ray to determine the level at which the  anterior incision was to be made and this was marked with a skin marker.   After prepping and draping and securing  the drapes with Ioban, Dr. Madilyn Fireman  approached the lumbosacral level via a right-sided retroperitoneal approach  with transverse incision of the rectus sheath.  He will dictate this as a  separate procedure.   Once the retractor blades were placed at L5-S1 and adequate exposure of  obtained, annulectomy in rectangular shape was carried out from side-to-  side.  Actually prior to removing the annulus and disc, a disc elevator was  used to separate the nucleus of the disc from the endplates.  A Leksell  rongeur was then used to remove about 70% the disc once.  We then curetted  circumferentially, particularly posteriorly, curetted back to the posterior  longitudinal ligament.  The annulus was completely removed superiorly and  inferiorly, posteriorly and anteriorly, and partially bilaterally.  We were  able to visualize the posterior longitudinal ligament and as we had removed  the annulus through the entire posterior part of the disc including  posterolaterally, we were able to distract the disc space quite well.  A  cross table lateral fluoroscopic unit image showed good distractability.   Prior to starting the annulotomy, we had marked the center of the disc with  a radiopaque marker, and judged that we were in satisfactory position except  for a small adjustment made about 2 mm to the right.  We then marked the  center point in the coronal plane using cautery on the anterior aspect of  the L5 vertebra.   Next, after meticulously curetting away endplate cartilage, the 10 mm disc  distractor fitted nicely and could be positioned posteriorly at the  posterior border of the L5 vertebra.  We then checked this for position in  both sagittal and coronal planes and it looked in good position.  We then  used the endplate channeling device over the 10 mm block and created slots  in S1and L5 for insertion of the keel on the implant.  We then unpackaged a  10 mm x 11 degrees medium Prodisk implant.   The superior and inferior  endplates were then loaded on the inserting device and the endplates  impacted into place.  This was guided on lateral fluoroscopic views until we  had seated the device as far posteriorly as possible.  Next, the  polyethylene insert was inserted, being careful to orient it correctly, and  it was then snapped into place and the fit on the metal endplate on S1 was  carefully palpated and visualized.  It was flush and there was no protrusion  of polyethylene over the metal.  We then removed the distracting/inserting  device.  AP and lateral images taken with the fluoroscopic unit were fine.   Dr. Madilyn Fireman then carefully removed the retractors.  The bleeding, which had  been coming from the right pelvic area, had subsided and Gelfoam was left in  place.  Dr. Madilyn Fireman then effected the closure with continuous 0 Vicryl in the  rectus sheath, 2-0 Vicryl in an interrupted fashion in the subcutaneous  layer, and 4-0 undyed Vicryl in the skin.  The patient was then awakened  after application of dressing.  She is able to move all limbs.  Sponge and  needle counts were correct.      Nelda Severe, MD  Electronically Signed     MT/MEDQ  D:  12/07/2005  T:  12/07/2005  Job:  [id number]

## 2010-06-26 NOTE — H&P (Signed)
NAMEHOLLY, [name]                 ACCOUNT NO.:  [id number]   MEDICAL RECORD NO.:  [id number]          PATIENT TYPE:  AMB   LOCATION:  SDS                          FACILITY:  [facility]   PHYSICIAN:  Lianne Cure, P.A.  DATE OF BIRTH:  01-08-1946   DATE OF ADMISSION:  DATE OF DISCHARGE:                                HISTORY & PHYSICAL   CHIEF COMPLAINT:  Axial lumbar back pain.   HISTORY OF PRESENT ILLNESS:  Lumbar pain secondary to degenerative disk  disease at L5-S1.  She also has a history of hypertension, acid reflux, and  migraine headaches.   ALLERGIES:  1. PENICILLIN.  2. MORPHINE.   CURRENT MEDICATIONS:  1. Atenolol 25 mg once daily.  2. HCTZ 25 mg once daily.  3. Hydrocodone 7.5/500.  4. Tylenol twice daily.  5. Topamax 25 mg 3 tablets at bedtime.   PAST SURGICAL HISTORY:  1. Bilateral knee arthroscopy.  2. Right shoulder arthroscopy.  3. Left arm repair secondary to trauma.  4. Bilateral feet surgery secondary to chronic calluses.  5. Hysterectomy.  6. Benign tumor excision right side of neck.   FAMILY HISTORY:  1. Stroke.  2. Coronary artery disease.  3. Diabetes.  4. Osteoarthritis.  5. Cancer.   REVIEW OF SYSTEMS:  She does wear reading glasses.  She has no change in  vision, no loss of hearing, no ear pain, no hoarseness, no nose bleeds, no  difficulty swallowing, and no chronic cough.  No fever or chills.  No lower  extremity swelling.  No calf pain with walking.  No nausea or vomiting.  She  does have frequent urination.  No obvious bleeding tendency disorders.  No  seizures.  No blackouts.   PHYSICAL EXAMINATION:  VITAL SIGNS:  Today, her temperature is 98.7, pulse  90, respirations approximately 20, blood pressure 120/90.  GENERAL APPEARANCE:  She is a well-developed, well-nourished obese very  pleasant lady.  HEENT:  Pupils are equal, round, and reactive light.  NECK:  Supple with no adenopathy.  Full range of motion does not elicit  pain.  CHEST:  Clear to auscultation bilaterally.  No wheezes.  No rales.  HEART:  Regular rate and rhythm.  No murmurs noted.  ABDOMEN:  Soft and nontender to palpation.  Positive bowel sounds.  EXTREMITIES:  She has right buttock pain and the posterior leg that stays  above the knee and axial back pain.  Sensation distally is intact to light  touch L4 through S1.  Palpable DP and PT pulses are intact and equal  bilaterally.  Strength is grossly 5/5 distally in the extensor hallucis  longus, anterior tibialis, quadriceps, and hamstrings.   LABORATORY DATA:  X-rays reveal degenerative disk disease at L5-S1.   IMPRESSION:  Degenerative disk disease L5-S1, axial lumbar pain.   PLAN:  Disk replacement at L5-S1 by Dr. Nelda Severe with the assistance of  Dr. Madilyn Fireman.      Lianne Cure, P.A.     MC/MEDQ  D:  12/03/2005  T:  12/04/2005  Job:  [id number]

## 2010-07-13 ENCOUNTER — Telehealth: Payer: Self-pay | Admitting: Gastroenterology

## 2010-07-13 MED ORDER — OMEPRAZOLE 40 MG PO CPDR: 40.0000 mg | DELAYED_RELEASE_CAPSULE | Freq: Every day | ORAL | Status: DC

## 2010-07-13 NOTE — Telephone Encounter (Signed)
Spoke with patient and she states she needs a alternative medication that her insurance will cover and is cheaper. Told her to continue ranitidine and I will send in omeprazole but there is no guarentee that this will work the same as [employer]. Pt verbalized understanding. Told her to call back if this does not control her symptoms. Pt agreed.

## 2010-07-16 ENCOUNTER — Other Ambulatory Visit: Payer: Self-pay | Admitting: Pulmonary Disease

## 2010-08-15 ENCOUNTER — Other Ambulatory Visit: Payer: Self-pay | Admitting: Internal Medicine

## 2010-08-20 ENCOUNTER — Encounter: Payer: Self-pay | Admitting: Pulmonary Disease

## 2010-08-20 ENCOUNTER — Ambulatory Visit (INDEPENDENT_AMBULATORY_CARE_PROVIDER_SITE_OTHER): Payer: Medicare Other | Admitting: Pulmonary Disease

## 2010-08-20 DIAGNOSIS — G4700 Insomnia, unspecified: Secondary | ICD-10-CM

## 2010-08-20 DIAGNOSIS — G2581 Restless legs syndrome: Secondary | ICD-10-CM

## 2010-08-20 NOTE — Assessment & Plan Note (Signed)
She has made great strides with her insomnia, but needs to be very vigilant with her behavioral therapy.  I would also like her to gradually begin to decrease her trazodone dose.  She understands that it will take time for her to get back to sleeping normally.

## 2010-08-20 NOTE — Patient Instructions (Signed)
Continue to establish your good sleep habits.  Stay off computer after 10pm, unwind in evening with reading or tv, do not watch tv in bed. Try and gradually decrease trazodone dose Stay on meds for restless legs. followup with me in 6mos.

## 2010-08-20 NOTE — Assessment & Plan Note (Addendum)
The pt is doing better with the higher dose of requip, and I have asked her to continue

## 2010-08-20 NOTE — Progress Notes (Signed)
  Subjective:    Patient ID: [name], female    DOB: 11-25-1945, 65 y.o.   MRN: [id number]  HPI The pt comes in today for f/u of her RLS and insomnia.  She has been staying on her dopamine agonist, and denies any significant breakthru symptoms.  She is sleeping much better with trazodone at bedtime, and I have encouraged her to begin trying to taper the dose if possible.  She still does not feel completely rested in the am's, but is much better.  I have told her trazodone can sometimes have a "hangover effect".  Overall, she has done well with her sleep hygiene.  However, she admits that she will still sometimes stay on computer late or watch tv in bed.    Review of Systems  Constitutional: Negative for fever and unexpected weight change.  HENT: Positive for rhinorrhea. Negative for ear pain, nosebleeds, congestion, sore throat, sneezing, trouble swallowing, dental problem, postnasal drip and sinus pressure.   Eyes: Negative for redness and itching.  Respiratory: Negative for cough, chest tightness, shortness of breath and wheezing.   Cardiovascular: Positive for leg swelling. Negative for palpitations.  Gastrointestinal: Negative for nausea and vomiting.  Genitourinary: Negative for dysuria.  Musculoskeletal: Negative for joint swelling.  Skin: Negative for rash.  Neurological: Negative for headaches.  Hematological: Bruises/bleeds easily.  Psychiatric/Behavioral: Positive for dysphoric mood. The patient is not nervous/anxious.        Objective:   Physical Exam Ow female in nad Nares without discharge or purulence LE without edema, no cyanosis noted. Alert, does not appear sleepy, moves all 4        Assessment & Plan:

## 2010-09-30 ENCOUNTER — Telehealth: Payer: Self-pay | Admitting: Internal Medicine

## 2010-09-30 ENCOUNTER — Other Ambulatory Visit (INDEPENDENT_AMBULATORY_CARE_PROVIDER_SITE_OTHER): Payer: Medicare Other

## 2010-09-30 DIAGNOSIS — M1710 Unilateral primary osteoarthritis, unspecified knee: Secondary | ICD-10-CM

## 2010-09-30 DIAGNOSIS — M810 Age-related osteoporosis without current pathological fracture: Secondary | ICD-10-CM

## 2010-09-30 NOTE — Progress Notes (Signed)
Labs only

## 2010-09-30 NOTE — Telephone Encounter (Signed)
error 

## 2010-10-01 LAB — VITAMIN D 25 HYDROXY (VIT D DEFICIENCY, FRACTURES): Vit D, 25-Hydroxy: 36 ng/mL (ref 30–89)

## 2010-10-01 LAB — CALCIUM: Calcium: 8.4 mg/dL (ref 8.4–10.5)

## 2010-10-19 ENCOUNTER — Ambulatory Visit (INDEPENDENT_AMBULATORY_CARE_PROVIDER_SITE_OTHER): Payer: Medicare Other | Admitting: Internal Medicine

## 2010-10-19 ENCOUNTER — Encounter: Payer: Self-pay | Admitting: Internal Medicine

## 2010-10-19 DIAGNOSIS — R5383 Other fatigue: Secondary | ICD-10-CM

## 2010-10-19 DIAGNOSIS — R079 Chest pain, unspecified: Secondary | ICD-10-CM

## 2010-10-19 DIAGNOSIS — I10 Essential (primary) hypertension: Secondary | ICD-10-CM

## 2010-10-19 DIAGNOSIS — E785 Hyperlipidemia, unspecified: Secondary | ICD-10-CM

## 2010-10-19 DIAGNOSIS — R5381 Other malaise: Secondary | ICD-10-CM

## 2010-10-19 NOTE — Assessment & Plan Note (Signed)
Ongoing issue, back in 2008 she complained of profound fatigue, sleep study showed no sleep apnea. At this point, I'll check some basic labs and observe. I doubt symptoms are related to Pravachol. Sleep disturbance may be part of the problem, she is follow up by pulmonary.

## 2010-10-19 NOTE — Assessment & Plan Note (Signed)
Well-controlled, no change 

## 2010-10-19 NOTE — Assessment & Plan Note (Signed)
On and off chest pain, cardiac catheterization normal 01-2010

## 2010-10-19 NOTE — Assessment & Plan Note (Signed)
Good compliance with Pravachol prescribed based on the last FLP. Labs

## 2010-10-19 NOTE — Progress Notes (Signed)
  Subjective:    Patient ID: [name], female    DOB: [birth year], 65 y.o.   MRN: [id number]  HPI  routine office visit Low history of fatigue, on and off, feeling really tired for the last 4 weeks. Fatigue is described as just lack of energy. Hyperlipidemia: Started on Pravachol several months ago, good compliance, no myalgias. Hypertension: Good medication compliance. Still having difficulty sleeping, followup by pulmonary.  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 See report for other minor abnormalities. CKs slightly  elevated w/ normal troponins . eventually had a cardiac catheterization: Negative  Past Surgical History: Hysterectomy for endometriosis Oophorectomy L5-S1 anterior fusion Bilateral knee arthroscopies Right-shoulder surgery Left-arm surgery Bilateral foot surgeries  Social History: not married-divorced live by  herself. Has a son Patient is a former smoker. -stopped 30 years ago Alcohol Use -yes-occasional  Patient gets regular exercise.  Review of Systems She does have chest pain or no, no lower extremity edema or orthopnea. No depression or anxiety, from time to time she just get down. She started to exercise more, but was unable to continue with the routine if the knee and back pain.     Objective:   Physical Exam  Constitutional: She is oriented to person, place, and time. She appears well-developed and well-nourished. No distress.  HENT:  Head: Normocephalic and atraumatic.  Neck: No JVD present. No thyromegaly present.  Cardiovascular: Normal rate, regular rhythm and normal heart  sounds.   No murmur heard. Pulmonary/Chest: Effort normal and breath sounds normal. No respiratory distress. She has no wheezes. She has no rales.  Abdominal: Soft. Bowel sounds are normal. She exhibits no distension. There is no tenderness. There is no rebound and no guarding.  Musculoskeletal: She exhibits no edema.  Neurological: She is alert and oriented to person, place, and time.  Skin: She is not diaphoretic.          Assessment & Plan:

## 2010-10-20 LAB — CBC WITH DIFFERENTIAL/PLATELET
Basophils Absolute: 0.1 10*3/uL (ref 0.0–0.1)
Basophils Relative: 1 % (ref 0.0–3.0)
Eosinophils Absolute: 0.1 10*3/uL (ref 0.0–0.7)
Eosinophils Relative: 1 % (ref 0.0–5.0)
HCT: 38.3 % (ref 36.0–46.0)
Hemoglobin: 12.5 g/dL (ref 12.0–15.0)
Lymphocytes Relative: 35.5 % (ref 12.0–46.0)
Lymphs Abs: 2.4 10*3/uL (ref 0.7–4.0)
MCHC: 32.7 g/dL (ref 30.0–36.0)
MCV: 96.4 fl (ref 78.0–100.0)
Monocytes Absolute: 1 10*3/uL (ref 0.1–1.0)
Monocytes Relative: 15.4 % — ABNORMAL HIGH (ref 3.0–12.0)
Neutro Abs: 3.1 10*3/uL (ref 1.4–7.7)
Neutrophils Relative %: 47.1 % (ref 43.0–77.0)
Platelets: 178 10*3/uL (ref 150.0–400.0)
RBC: 3.97 Mil/uL (ref 3.87–5.11)
RDW: 13.8 % (ref 11.5–14.6)
WBC: 6.7 10*3/uL (ref 4.5–10.5)

## 2010-10-20 LAB — LIPID PANEL
Cholesterol: 146 mg/dL (ref 0–200)
HDL: 44.6 mg/dL (ref >39.00)
LDL Cholesterol: 88 mg/dL (ref 0–99)
Total CHOL/HDL Ratio: 3
Triglycerides: 65 mg/dL (ref 0.0–149.0)
VLDL: 13 mg/dL (ref 0.0–40.0)

## 2010-10-20 LAB — COMPREHENSIVE METABOLIC PANEL
ALT: 8 U/L (ref 0–35)
AST: 15 U/L (ref 0–37)
Albumin: 3.9 g/dL (ref 3.5–5.2)
Alkaline Phosphatase: 72 U/L (ref 39–117)
BUN: 17 mg/dL (ref 6–23)
CO2: 23 mEq/L (ref 19–32)
Calcium: 8.4 mg/dL (ref 8.4–10.5)
Chloride: 114 mEq/L — ABNORMAL HIGH (ref 96–112)
Creatinine, Ser: 1 mg/dL (ref 0.4–1.2)
GFR: 56.34 mL/min — ABNORMAL LOW (ref >60.00)
Glucose, Bld: 95 mg/dL (ref 70–99)
Potassium: 4.4 mEq/L (ref 3.5–5.1)
Sodium: 143 mEq/L (ref 135–145)
Total Bilirubin: 0.3 mg/dL (ref 0.3–1.2)
Total Protein: 7.1 g/dL (ref 6.0–8.3)

## 2010-10-22 ENCOUNTER — Telehealth: Payer: Self-pay

## 2010-10-22 NOTE — Telephone Encounter (Signed)
Left message on personally identified voicemail to notify pt of results. Labs also mailed

## 2010-10-22 NOTE — Telephone Encounter (Signed)
Message copied by Beverely Low on Thu Oct 22, 2010 11:05 AM ------      Message from: Willow Ora E      Created: Wed Oct 21, 2010 10:15 PM       Advise patient:      Cholesterol has improve tremendously, other labs  essentially normal. Good results,   continue with same meds

## 2010-10-26 ENCOUNTER — Other Ambulatory Visit: Payer: Self-pay | Admitting: Pulmonary Disease

## 2010-10-27 ENCOUNTER — Telehealth: Payer: Self-pay | Admitting: Pulmonary Disease

## 2010-10-27 NOTE — Telephone Encounter (Signed)
Ok to call in  # 50, 6 fills.  Can call in 90 days at a time if better for her.

## 2010-10-27 NOTE — Telephone Encounter (Signed)
Pt states has been w/o Trazodone since Saturday. Has not been sleeping well, when she does get to sleep she is c/o waking up every hour. States she does apologize for waiting so late to request refill but had a lot going on with her brother-in-law passing away last week. Per KC last OV note pt was to start decreasing dose, pt states she is trying to only take 1 tab daily but has to increase to 2 tabs on most days. Please advise if okay to refill and if so what quantity. Thank you.

## 2010-10-28 NOTE — Telephone Encounter (Signed)
rx sent to pharmacy.  Pt aware.  

## 2010-10-28 NOTE — Telephone Encounter (Signed)
PATIENT CALLED.  PLEASE CALL HER BACK AFTER CALLING PHARMACY TO AUTHORIZE

## 2010-11-05 ENCOUNTER — Other Ambulatory Visit: Payer: Self-pay | Admitting: *Deleted

## 2010-11-05 MED ORDER — TRAZODONE HCL 50 MG PO TABS: ORAL_TABLET | ORAL | Status: DC

## 2010-11-21 ENCOUNTER — Other Ambulatory Visit: Payer: Self-pay | Admitting: Internal Medicine

## 2010-11-25 LAB — CBC
HCT: 36.4
Hemoglobin: 12.4
MCHC: 34.1
MCV: 88.4
Platelets: 261
RBC: 4.11
RDW: 13.4
WBC: 9.2

## 2010-11-25 LAB — POCT CARDIAC MARKERS
CKMB, poc: <1 — ABNORMAL LOW
Myoglobin, poc: 70.4
Operator id: 4761
Troponin i, poc: <0.05

## 2010-11-25 LAB — BASIC METABOLIC PANEL
BUN: 14
CO2: 25
Calcium: 8.9
Chloride: 105
Creatinine, Ser: 0.89
GFR calc Af Amer: >60
GFR calc non Af Amer: >60
Glucose, Bld: 120 — ABNORMAL HIGH
Potassium: 2.8 — ABNORMAL LOW
Sodium: 140

## 2010-11-25 LAB — DIFFERENTIAL
Basophils Absolute: 0
Basophils Relative: 0
Eosinophils Absolute: 0
Eosinophils Relative: 1
Lymphocytes Relative: 28
Lymphs Abs: 2.6
Monocytes Absolute: 0.6
Monocytes Relative: 6
Neutro Abs: 6
Neutrophils Relative %: 65

## 2010-12-29 ENCOUNTER — Other Ambulatory Visit: Payer: Self-pay | Admitting: Internal Medicine

## 2011-02-15 ENCOUNTER — Other Ambulatory Visit: Payer: Self-pay | Admitting: Pulmonary Disease

## 2011-02-17 ENCOUNTER — Ambulatory Visit (INDEPENDENT_AMBULATORY_CARE_PROVIDER_SITE_OTHER): Payer: Medicare Other | Admitting: Internal Medicine

## 2011-02-17 ENCOUNTER — Encounter: Payer: Self-pay | Admitting: Internal Medicine

## 2011-02-17 VITALS — BP 110/80 | HR 86 | Temp 98.5°F | Ht 64.75 in | Wt 225.0 lb

## 2011-02-17 DIAGNOSIS — Z23 Encounter for immunization: Secondary | ICD-10-CM

## 2011-02-17 DIAGNOSIS — I10 Essential (primary) hypertension: Secondary | ICD-10-CM

## 2011-02-17 DIAGNOSIS — M2550 Pain in unspecified joint: Secondary | ICD-10-CM | POA: Diagnosis not present

## 2011-02-17 DIAGNOSIS — E785 Hyperlipidemia, unspecified: Secondary | ICD-10-CM

## 2011-02-17 NOTE — Assessment & Plan Note (Signed)
On hydrocodone as needed for back and knee pain.

## 2011-02-17 NOTE — Assessment & Plan Note (Signed)
Well-controlled, no change 

## 2011-02-17 NOTE — Assessment & Plan Note (Addendum)
Chart reviewed, well controlled per last cholesterol panel. No change. We discussed diet and exercise today

## 2011-02-17 NOTE — Patient Instructions (Signed)
you are doing great! Exercise daily! Keep loosing weight

## 2011-02-17 NOTE — Progress Notes (Signed)
  Subjective:    Patient ID: [name], female    DOB: [birth year], 66 y.o.   MRN: [id number]  HPI Routine office visit Since the last time she is doing very well. No concerns today.  Past Medical History:  Hyperlipidemia  Hypertension  Osteoporosis  MIGRAINE HEADACHE - topamax  Hiatal hernia  Depression  G! --GERD, GASTRITIS, ESOPHAGITIS, BARRETTS ESOPHAGUS no dysplasia  --EGD 3-11, EGD again 10-11+ Barrett's  PERSISTENT DISORDER INITIATING/MAINTAINING SLEEP  RLS  BACK PAIN, CHRONIC  MENOPAUSE, SURGICAL  (-) cath 12-2002  01-2010 chest pain, negative stress test, echo EF 55%. See report for other minor abnormalities. CKs slightly elevated w/ normal troponins . eventually had a cardiac catheterization: Negative   Past Surgical History:  Hysterectomy for endometriosis  Oophorectomy  L5-S1 anterior fusion  Bilateral knee arthroscopies  Right-shoulder surgery  Left-arm surgery  Bilateral foot surgeries   Social History:  not married-divorced  live by herself.  Has a son  Patient is a former smoker. -stopped 30 years ago  Alcohol Use -yes-occasional   Review of Systems Good medication compliance w/ HTN meds, ambulatory blood pressures within normal. She is doing better with diet and exercise, has lost 4 pounds. Still gets very tired with walking. Still needs pain medication which she takes mostly for back pain and knee pain.    Objective:   Physical Exam  Constitutional: She is oriented to person, place, and time. She appears well-developed and well-nourished.  HENT:  Head: Normocephalic and atraumatic.  Cardiovascular: Normal rate, regular rhythm and normal heart sounds.   No murmur heard. Pulmonary/Chest: No respiratory distress. She has no wheezes. She has no rales.  Musculoskeletal: She exhibits no edema.  Neurological: She is alert and oriented to person, place, and time.       Assessment & Plan:

## 2011-02-22 ENCOUNTER — Encounter: Payer: Self-pay | Admitting: Pulmonary Disease

## 2011-02-22 ENCOUNTER — Ambulatory Visit (INDEPENDENT_AMBULATORY_CARE_PROVIDER_SITE_OTHER): Payer: Medicare Other | Admitting: Pulmonary Disease

## 2011-02-22 DIAGNOSIS — G4700 Insomnia, unspecified: Secondary | ICD-10-CM | POA: Diagnosis not present

## 2011-02-22 DIAGNOSIS — G2581 Restless legs syndrome: Secondary | ICD-10-CM

## 2011-02-22 MED ORDER — ROPINIROLE HCL 1 MG PO TABS: ORAL_TABLET | ORAL | Status: DC

## 2011-02-22 MED ORDER — TRAZODONE HCL 50 MG PO TABS: ORAL_TABLET | ORAL | Status: DC

## 2011-02-22 NOTE — Progress Notes (Signed)
  Subjective:    Patient ID: [name], female    DOB: 1945/07/15, 66 y.o.   MRN: [id number]  HPI The patient comes in today for followup of her restless legs and also has psychophysiologic insomnia.  She is doing well on a dopamine agonist, and denies any issues with persistent leg movements.  She feels that she is unable to decrease her trazodone to one tablet at bedtime, but I have asked her to start with one and one half and then try to get it to one.  she thinks that she may be sleep talking, but does not have a bed partner to verify this.   Review of Systems  Constitutional: Positive for chills. Negative for fever and unexpected weight change.  HENT: Positive for rhinorrhea. Negative for ear pain, nosebleeds, congestion, sore throat, sneezing, trouble swallowing, dental problem, postnasal drip and sinus pressure.   Eyes: Positive for itching. Negative for redness.  Respiratory: Positive for cough. Negative for chest tightness, shortness of breath and wheezing.   Cardiovascular: Negative for palpitations and leg swelling.  Gastrointestinal: Negative for nausea and vomiting.  Genitourinary: Negative for dysuria.  Musculoskeletal: Negative for joint swelling.  Skin: Negative for rash.  Neurological: Positive for headaches.  Hematological: Bruises/bleeds easily.  Psychiatric/Behavioral: Negative for dysphoric mood. The patient is not nervous/anxious.        Objective:   Physical Exam Overweight female in no acute distress Nose without purulence or discharge noted Mild lower extremity edema, no cyanosis Alert and oriented, moves all 4 extremities.  Does not appear to be sleepy.       Assessment & Plan:

## 2011-02-22 NOTE — Patient Instructions (Signed)
Continue on requip for your restless legs Try and decrease trazodone to 75mg  ( 1 1/2 tab), then down to one a day. followup with me in one year if doing well.

## 2011-02-22 NOTE — Progress Notes (Signed)
Addended by: Salli Quarry on: 02/22/2011 05:11 PM   Modules accepted: Orders

## 2011-02-22 NOTE — Assessment & Plan Note (Signed)
The patient is doing very well on her dopamine agonist.  I have asked her to continue this.

## 2011-02-22 NOTE — Assessment & Plan Note (Signed)
The patient is doing very well on trazodone, but I have asked her to try and work on her dose down.  I have explained through good sleep hygiene and doing some of the behavioral therapies, I would hope that we could get her completely off the trazodone.  I am not sure that she is willing to do what needs to be done to accomplish this.  I have reviewed good sleep hygiene with her, as well as the behavioral therapies for her insomnia.

## 2011-02-27 ENCOUNTER — Other Ambulatory Visit: Payer: Self-pay | Admitting: Internal Medicine

## 2011-03-01 NOTE — Telephone Encounter (Signed)
rx sent to pharmacy by e-script  

## 2011-03-23 ENCOUNTER — Other Ambulatory Visit: Payer: Self-pay | Admitting: Gastroenterology

## 2011-03-25 ENCOUNTER — Other Ambulatory Visit: Payer: Self-pay | Admitting: Gastroenterology

## 2011-03-25 NOTE — Telephone Encounter (Signed)
Spoke with patient and informed her to keep her appt for any further refills. Refilled ranitidine one time until scheduled appt on 03/31/11.

## 2011-03-25 NOTE — Telephone Encounter (Signed)
Keep appt for any further refills.

## 2011-03-31 ENCOUNTER — Encounter: Payer: Self-pay | Admitting: Gastroenterology

## 2011-03-31 ENCOUNTER — Ambulatory Visit (INDEPENDENT_AMBULATORY_CARE_PROVIDER_SITE_OTHER): Payer: Medicare Other | Admitting: Gastroenterology

## 2011-03-31 VITALS — BP 114/80 | HR 60 | Ht 66.0 in | Wt 227.4 lb

## 2011-03-31 DIAGNOSIS — Z800 Family history of malignant neoplasm of digestive organs: Secondary | ICD-10-CM | POA: Diagnosis not present

## 2011-03-31 DIAGNOSIS — K2270 Barrett's esophagus without dysplasia: Secondary | ICD-10-CM | POA: Diagnosis not present

## 2011-03-31 MED ORDER — DEXLANSOPRAZOLE 60 MG PO CPDR: 60.0000 mg | DELAYED_RELEASE_CAPSULE | Freq: Every day | ORAL | Status: DC

## 2011-03-31 MED ORDER — OMEPRAZOLE 40 MG PO CPDR: 40.0000 mg | DELAYED_RELEASE_CAPSULE | Freq: Two times a day (BID) | ORAL | Status: DC

## 2011-03-31 NOTE — Patient Instructions (Signed)
Start taking Dexilant samples one tablet by mouth once daily until finished with samples. After your finished with samples, please pick up your prescription from the pharmacy for Prilosec twice daily.  Continue to take Zantac for breakthrough symptoms.  Patient advised to avoid spicy, acidic, citrus, chocolate, mints, fruit and fruit juices.  Limit the intake of caffeine, alcohol and Soda.  Don't exercise too soon after eating.  Don't lie down within 3-4 hours of eating.  Elevate the head of your bed. Call back is this does not adequately control your symptoms.  cc: Willow Ora, MD

## 2011-03-31 NOTE — Progress Notes (Signed)
History of Present Illness: This is a 66 year old female with Barrett's esophagus who relates that her reflux symptoms have been poorly controlled for several months. She has frequent nighttime awakenings with regurgitation and substantial morning nausea and dyspeptic symptoms. Previously her reflux is well controlled on Dexilant however she cannot afford it. She has noted a few rare episodes of scant amounts of BRB with bowel movements. Colonoscopy performed in October 2011 and was unremarkable. EGD in October 2011 and showed a short segment of what appeared to be Barrett's however biopsies did not reveal Barrett's. Denies weight loss, abdominal pain, constipation, diarrhea, change in stool caliber, melena, vomiting, dysphagia, chest pain.  Current Medications, Allergies, Past Medical History, Past Surgical History, Family History and Social History were reviewed in [employer] record.  Physical Exam: General: Well developed , well nourished, obese, no acute distress Head: Normocephalic and atraumatic Eyes:  sclerae anicteric, EOMI Ears: Normal auditory acuity Mouth: No deformity or lesions Lungs: Clear throughout to auscultation Heart: Regular rate and rhythm; no murmurs, rubs or bruits Abdomen: Soft, non tender and non distended. No masses, hepatosplenomegaly or hernias noted. Normal Bowel sounds Musculoskeletal: Symmetrical with no gross deformities  Pulses:  Normal pulses noted Extremities: No clubbing, cyanosis, edema or deformities noted Neurological: Alert oriented x 4, grossly nonfocal Psychological:  Alert and cooperative. Normal mood and affect  Assessment and Recommendations:  1. BARRETTS ESOPHAGUS AND REFRACTORY GERD. Surveillance endoscopy March 2014. Change to Dexilant 60 mg samples every morning for 2-3 weeks and then begin omeprazole 40 mg twice a day before breakfast and dinner. Continue ranitidine 300 mg at bedtime. Intensify antireflux measures, begin  4" bed blocks for long-term use. Contact our office if her reflux symptoms are not under good control in 4-6 weeks.  2. FAMILY HX COLON CANCER AND SCANT HEMATOCHEZIA. She is advised to monitor her scant rectal bleeding and if her symptoms worsen she is to call for further evaluation. Elevated risk screening colonoscopy recommended October 2016.

## 2011-04-18 ENCOUNTER — Other Ambulatory Visit: Payer: Self-pay | Admitting: Pulmonary Disease

## 2011-04-26 ENCOUNTER — Other Ambulatory Visit: Payer: Self-pay | Admitting: Gastroenterology

## 2011-05-01 ENCOUNTER — Other Ambulatory Visit: Payer: Self-pay | Admitting: Pulmonary Disease

## 2011-05-24 DIAGNOSIS — H26499 Other secondary cataract, unspecified eye: Secondary | ICD-10-CM | POA: Diagnosis not present

## 2011-05-24 DIAGNOSIS — H40019 Open angle with borderline findings, low risk, unspecified eye: Secondary | ICD-10-CM | POA: Diagnosis not present

## 2011-06-09 DIAGNOSIS — G43019 Migraine without aura, intractable, without status migrainosus: Secondary | ICD-10-CM | POA: Diagnosis not present

## 2011-06-14 ENCOUNTER — Encounter: Payer: Self-pay | Admitting: Internal Medicine

## 2011-06-14 ENCOUNTER — Ambulatory Visit (INDEPENDENT_AMBULATORY_CARE_PROVIDER_SITE_OTHER): Payer: Medicare Other | Admitting: Internal Medicine

## 2011-06-14 VITALS — BP 128/84 | HR 74 | Temp 97.9°F | Wt 225.0 lb

## 2011-06-14 DIAGNOSIS — G4700 Insomnia, unspecified: Secondary | ICD-10-CM

## 2011-06-14 DIAGNOSIS — K2270 Barrett's esophagus without dysplasia: Secondary | ICD-10-CM

## 2011-06-14 DIAGNOSIS — E785 Hyperlipidemia, unspecified: Secondary | ICD-10-CM

## 2011-06-14 DIAGNOSIS — G43909 Migraine, unspecified, not intractable, without status migrainosus: Secondary | ICD-10-CM

## 2011-06-14 DIAGNOSIS — I10 Essential (primary) hypertension: Secondary | ICD-10-CM

## 2011-06-14 DIAGNOSIS — M810 Age-related osteoporosis without current pathological fracture: Secondary | ICD-10-CM

## 2011-06-14 DIAGNOSIS — M2550 Pain in unspecified joint: Secondary | ICD-10-CM

## 2011-06-14 DIAGNOSIS — Z0000 Encounter for general adult medical examination without abnormal findings: Secondary | ICD-10-CM | POA: Insufficient documentation

## 2011-06-14 LAB — BASIC METABOLIC PANEL
BUN: 23 mg/dL (ref 6–23)
CO2: 22 mEq/L (ref 19–32)
Calcium: 8.5 mg/dL (ref 8.4–10.5)
Chloride: 112 mEq/L (ref 96–112)
Creatinine, Ser: 0.9 mg/dL (ref 0.4–1.2)
GFR: 66.44 mL/min (ref >60.00)
Glucose, Bld: 95 mg/dL (ref 70–99)
Potassium: 3.8 mEq/L (ref 3.5–5.1)
Sodium: 144 mEq/L (ref 135–145)

## 2011-06-14 LAB — LIPID PANEL
Cholesterol: 153 mg/dL (ref 0–200)
HDL: 48.1 mg/dL (ref >39.00)
LDL Cholesterol: 82 mg/dL (ref 0–99)
Total CHOL/HDL Ratio: 3
Triglycerides: 114 mg/dL (ref 0.0–149.0)
VLDL: 22.8 mg/dL (ref 0.0–40.0)

## 2011-06-14 LAB — ALT: ALT: 12 U/L (ref 0–35)

## 2011-06-14 LAB — AST: AST: 13 U/L (ref 0–37)

## 2011-06-14 LAB — TSH: TSH: 0.8 u[IU]/mL (ref 0.35–5.50)

## 2011-06-14 MED ORDER — ZOSTER VACCINE LIVE 19400 UNT/0.65ML ~~LOC~~ SOLR: 0.6500 mL | Freq: Once | SUBCUTANEOUS | Status: AC

## 2011-06-14 NOTE — Assessment & Plan Note (Signed)
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 She sees Dr. Renae Fickle routinely, has been very hesitant to undergo knee replacement. "My friends tell me it won't help". Encouraged to discuss further with orthopedic surgery, if she is a good candidate for surgery, she most likely will benefit from knee replacements

## 2011-06-14 NOTE — Assessment & Plan Note (Signed)
Good compliance with medication, labs 

## 2011-06-14 NOTE — Assessment & Plan Note (Addendum)
Td 2000 and 2012   Pneumonia shot-- 2012 shingles inmunization  benefits discussed, Rx provided   Colonoscopy 03-2006, Colonoscopy 10-11 , essentially negative,  next  colonoscopy 2016  Gyn (Dr Kalman Drape) exam and MMG: due, last ~ 3 years ago, strongly encouraged to call gyn and set an appointment   counseled about diet and exercise

## 2011-06-14 NOTE — Assessment & Plan Note (Signed)
Well controlled on Topamax, sees Dr. Neale Burly from time to time

## 2011-06-14 NOTE — Assessment & Plan Note (Signed)
No change 

## 2011-06-14 NOTE — Assessment & Plan Note (Signed)
Reportedly, bone density tests done at orthopedic surgery. On calcium and vitamin D.

## 2011-06-14 NOTE — Progress Notes (Signed)
  Subjective:    Patient ID: [name], female    DOB: 06/07/1945, 66 y.o.   MRN: [id number]  HPI Here for Medicare AWV: 1. Risk factors based on Past M, S, F history: reviewed  2. Physical Activities: home chores, some yard work,   walking limited by knee pain-back pain 3. Depression/mood: denies at this point 4. Hearing: no problems reported or noted , has tinnitus, s/p ENT eval, they rec observation  5. ADL's: independent  6. Fall Risk: no recent falls , prevention discussed  7. Home Safety: does feel safe at home  8. Height, weight, &visual acuity: see VS , saw the  eye doctor ~ 3 weeks ago 9. Counseling: yes  10. Labs ordered based on risk factors: yes  11.       Referral Coordination, if needed 12.       Cognitive Assessment: cognition and memory wnl, motor skills limited by pain and deconditioning   In addition, we discussed the following: Migraine headaches, well controlled with Topamax, she just saw Dr. Neale Burly Insomnia, well controlled with current meds. High cholesterol, good medication compliance. Hypertension, good medication compliance, ambulatory blood pressures usually ~ 115/70. GERD symptoms well controlled except when she eats something heavy.  Past Medical History:  Hyperlipidemia  Hypertension  Osteoporosis  DJD, knee & back pain MIGRAINE HEADACHE - topamax  Depression  DJD-- back, knees G!  --GERD, GASTRITIS, ESOPHAGITIS, BARRETTS ESOPHAGUS no dysplasia  --EGD 3-11, EGD again 10-11+ Barrett's  --H H Insomnia RLS  MENOPAUSE, SURGICAL  (-) cath 12-2002  01-2010 chest pain, negative stress test, echo EF 55%. See report for other minor abnormalities. CKs slightly elevated w/ normal troponins . eventually had a cardiac catheterization: Negative   Past Surgical History:  Hysterectomy for endometriosis , Oophorectomy  L5-S1 anterior fusion  Bilateral knee arthroscopies  Right-shoulder surgery  Left-arm surgery  Bilateral foot surgeries   Social  History:  divorced, has a son who just moved with her  Tobacco--quit in the 59s Alcohol Use -yes,occasional  Occupation-- retired, post office Diet-- trying to eat healthier lately  Family History: colon ca-- maternal GF, 2 maternal uncles breast ca--sister Esophageal Cancer: Brother lung ca-- M "spleen ca" -- uncle Diabetes--  Aunt, uncle CAD--> CHF F mini strokes--F dementia-- F   Review of Systems No chest pain or shortness of breath. No nausea, vomiting, diarrhea. No blood in the stools.    Objective:   Physical Exam  General:  alert, well-developed, and overweight-appearing.   Neck:  no thyromegaly and normal carotid upstroke.   Lungs:  normal respiratory effort, no intercostal retractions, no accessory muscle use, and normal breath sounds.   Heart:  normal rate, regular rhythm, no murmur, and no gallop.   Abdomen:  soft, non-tender, no distention, no masses, no guarding, and no rigidity.   Extremities:  no edema Psych:  Cognition and judgment appear intact. Alert and cooperative with normal attention span and concentration.  not anxious appearing and not depressed appearing.      Assessment & Plan:

## 2011-06-14 NOTE — Assessment & Plan Note (Signed)
Per GI note, next endoscopy 04/2012. Patient reports good symptom control as long as she has a reasonable diet

## 2011-06-14 NOTE — Patient Instructions (Signed)
Is very important that you the gynecologist yearly and you've a yearly  mammogram.

## 2011-06-14 NOTE — Assessment & Plan Note (Signed)
Well controlled on trazodone. 

## 2011-06-15 ENCOUNTER — Encounter: Payer: Self-pay | Admitting: *Deleted

## 2011-06-23 ENCOUNTER — Other Ambulatory Visit: Payer: Self-pay | Admitting: Pulmonary Disease

## 2011-06-23 ENCOUNTER — Telehealth: Payer: Self-pay | Admitting: Pulmonary Disease

## 2011-06-23 NOTE — Telephone Encounter (Signed)
Called spoke with patient who stated that she uses her trazodone 1.5 tabs daily, and will rarely take 2 tabs.  Pt denied taking her medication more often than this.  Pt stated that [facility] told her that she needed to contact the office for authorization on refills.  [facility]ed [facility] spoke with Arline Asp who stated that they have been filling pt's prior rx written in 10/2010 and refills on that have run out.  Informed Arline Asp that our office sent refills in 02/2011 and asked her to verify this.  Arline Asp did verify this and stated that she will prepare this rx for pt and she will have the 5 refills authorized on that rx.    Called spoke with patient, let her know what was going on with her trazodone.  Pt okay with this and verbalized her understanding.  Pt did want to clarify: after looking in her pill box, she has been taking 1 tab trazodone 50mg  daily and will occasionally take 1.5 tabs.     Will sign off and forward to [facility] as an FYI to let him know that pt is not abusing this medication.

## 2011-06-23 NOTE — Telephone Encounter (Signed)
I spoke with pt and she is needing refill on her trazodone. Pt is down to 1 1/2 daily but occasionally take 1 a day. She is doing well. Pt last OV 02/22/11 told to f/u in 1 year. Last refilled 02/22/11 #50 x 5 refills. Pt needs refill called if not today then tomorrow bc she has 1 tab left. Please advise KC thanks

## 2011-06-23 NOTE — Telephone Encounter (Signed)
If she got 50 with 5 fills the middle of jan, she should not be out of medication at 1 1/2 tabs a day.  What is the deal?

## 2011-06-24 ENCOUNTER — Other Ambulatory Visit: Payer: Self-pay | Admitting: Internal Medicine

## 2011-06-24 NOTE — Telephone Encounter (Signed)
Refill done.  

## 2011-07-09 ENCOUNTER — Other Ambulatory Visit: Payer: Self-pay | Admitting: Internal Medicine

## 2011-07-09 ENCOUNTER — Other Ambulatory Visit: Payer: Self-pay

## 2011-07-09 MED ORDER — OMEPRAZOLE 40 MG PO CPDR: 40.0000 mg | DELAYED_RELEASE_CAPSULE | Freq: Two times a day (BID) | ORAL | Status: DC

## 2011-07-09 MED ORDER — RANITIDINE HCL 300 MG PO TABS: 300.0000 mg | ORAL_TABLET | Freq: Every day | ORAL | Status: DC

## 2011-07-09 MED ORDER — ATENOLOL 25 MG PO TABS: 25.0000 mg | ORAL_TABLET | Freq: Every day | ORAL | Status: DC

## 2011-07-09 NOTE — Telephone Encounter (Signed)
refill Atenolol 25mg  tablet Requesting 90 day supply  Last wrt.11.20.12 qty 30 qt/5-refills  Take one tablet by mouth every day  Last OV 5.6.13

## 2011-07-13 ENCOUNTER — Telehealth: Payer: Self-pay | Admitting: Internal Medicine

## 2011-07-13 MED ORDER — PRAVASTATIN SODIUM 40 MG PO TABS: 40.0000 mg | ORAL_TABLET | Freq: Every day | ORAL | Status: DC

## 2011-07-13 NOTE — Telephone Encounter (Signed)
Refill: Pravastatin sodium 40mg  tab. 90 day supply

## 2011-07-13 NOTE — Telephone Encounter (Signed)
Refill done.  

## 2011-07-25 ENCOUNTER — Other Ambulatory Visit: Payer: Self-pay | Admitting: Internal Medicine

## 2011-07-26 NOTE — Telephone Encounter (Signed)
Refill done.  

## 2011-08-02 ENCOUNTER — Other Ambulatory Visit: Payer: Self-pay | Admitting: *Deleted

## 2011-08-02 MED ORDER — ROPINIROLE HCL 1 MG PO TABS: ORAL_TABLET | ORAL | Status: DC

## 2011-08-16 DIAGNOSIS — M25569 Pain in unspecified knee: Secondary | ICD-10-CM | POA: Diagnosis not present

## 2011-08-17 ENCOUNTER — Other Ambulatory Visit: Payer: Self-pay | Admitting: Orthopaedic Surgery

## 2011-09-20 ENCOUNTER — Encounter (HOSPITAL_COMMUNITY): Payer: Self-pay

## 2011-09-29 NOTE — Pre-Procedure Instructions (Signed)
[address] [name]  09/29/2011   Your procedure is scheduled on:  Tuesday October 05, 2011.  Report to [facility] at 0815 AM.  Call this number if you have problems the morning of surgery: [phone]   Remember:   Do not eat food or drink:After Midnight.    Take these medicines the morning of surgery with A SIP OF WATER: Atenolol (Tenormin), Hydrocodone if needed for pain, Omeprazole (Prilosec)   Do not wear jewelry, make-up or nail polish.  Do not wear lotions, powders, or perfumes.   Do not shave 48 hours prior to surgery.   Do not bring valuables to the hospital.  Contacts, dentures or bridgework may not be worn into surgery.  Leave suitcase in the car. After surgery it may be brought to your room.  For patients admitted to the hospital, checkout time is 11:00 AM the day of discharge.   Patients discharged the day of surgery will not be allowed to drive home.  Name and phone number of your driver:   Special Instructions: CHG Shower Use Special Wash: 1/2 bottle night before surgery and 1/2 bottle morning of surgery.   Please read over the following fact sheets that you were given: Pain Booklet, Coughing and Deep Breathing, Blood Transfusion Information, Total Joint Packet, MRSA Information and Surgical Site Infection Prevention

## 2011-09-30 ENCOUNTER — Encounter (HOSPITAL_COMMUNITY)
Admission: RE | Admit: 2011-09-30 | Discharge: 2011-09-30 | Disposition: A | Discharge status: Home / Self Care | Payer: Medicare Other | Source: Ambulatory Visit | Attending: Orthopaedic Surgery | Admitting: Orthopaedic Surgery

## 2011-09-30 ENCOUNTER — Encounter (HOSPITAL_COMMUNITY): Payer: Self-pay

## 2011-09-30 DIAGNOSIS — E785 Hyperlipidemia, unspecified: Secondary | ICD-10-CM | POA: Diagnosis not present

## 2011-09-30 DIAGNOSIS — M1710 Unilateral primary osteoarthritis, unspecified knee: Secondary | ICD-10-CM | POA: Diagnosis not present

## 2011-09-30 DIAGNOSIS — K449 Diaphragmatic hernia without obstruction or gangrene: Secondary | ICD-10-CM | POA: Diagnosis not present

## 2011-09-30 DIAGNOSIS — I10 Essential (primary) hypertension: Secondary | ICD-10-CM | POA: Diagnosis not present

## 2011-09-30 DIAGNOSIS — D62 Acute posthemorrhagic anemia: Secondary | ICD-10-CM | POA: Diagnosis not present

## 2011-09-30 DIAGNOSIS — Z01811 Encounter for preprocedural respiratory examination: Secondary | ICD-10-CM | POA: Diagnosis not present

## 2011-09-30 HISTORY — DX: Migraine, unspecified, not intractable, without status migrainosus: G43.909

## 2011-09-30 HISTORY — DX: Family history of other specified conditions: Z84.89

## 2011-09-30 LAB — BASIC METABOLIC PANEL
BUN: 21 mg/dL (ref 6–23)
CO2: 21 mEq/L (ref 19–32)
Calcium: 9.2 mg/dL (ref 8.4–10.5)
Chloride: 111 mEq/L (ref 96–112)
Creatinine, Ser: 1.01 mg/dL (ref 0.50–1.10)
GFR calc Af Amer: 65 mL/min — ABNORMAL LOW (ref >90)
GFR calc non Af Amer: 56 mL/min — ABNORMAL LOW (ref >90)
Glucose, Bld: 95 mg/dL (ref 70–99)
Potassium: 3.8 mEq/L (ref 3.5–5.1)
Sodium: 142 mEq/L (ref 135–145)

## 2011-09-30 LAB — TYPE AND SCREEN
ABO/RH(D): O NEG
Antibody Screen: NEGATIVE

## 2011-09-30 LAB — CBC
HCT: 38.2 % (ref 36.0–46.0)
Hemoglobin: 12.6 g/dL (ref 12.0–15.0)
MCH: 31 pg (ref 26.0–34.0)
MCHC: 33 g/dL (ref 30.0–36.0)
MCV: 93.9 fL (ref 78.0–100.0)
Platelets: 190 10*3/uL (ref 150–400)
RBC: 4.07 MIL/uL (ref 3.87–5.11)
RDW: 12.8 % (ref 11.5–15.5)
WBC: 8.1 10*3/uL (ref 4.0–10.5)

## 2011-09-30 LAB — DIFFERENTIAL
Basophils Absolute: 0 10*3/uL (ref 0.0–0.1)
Basophils Relative: 0 % (ref 0–1)
Eosinophils Absolute: 0.1 10*3/uL (ref 0.0–0.7)
Eosinophils Relative: 1 % (ref 0–5)
Lymphocytes Relative: 33 % (ref 12–46)
Lymphs Abs: 2.6 10*3/uL (ref 0.7–4.0)
Monocytes Absolute: 0.4 10*3/uL (ref 0.1–1.0)
Monocytes Relative: 5 % (ref 3–12)
Neutro Abs: 4.9 10*3/uL (ref 1.7–7.7)
Neutrophils Relative %: 61 % (ref 43–77)

## 2011-09-30 LAB — URINALYSIS, ROUTINE W REFLEX MICROSCOPIC
Bilirubin Urine: NEGATIVE
Glucose, UA: NEGATIVE mg/dL
Hgb urine dipstick: NEGATIVE
Ketones, ur: NEGATIVE mg/dL
Leukocytes, UA: NEGATIVE
Nitrite: NEGATIVE
Protein, ur: NEGATIVE mg/dL
Specific Gravity, Urine: 1.012 (ref 1.005–1.030)
Urobilinogen, UA: 0.2 mg/dL (ref 0.0–1.0)
pH: 5.5 (ref 5.0–8.0)

## 2011-09-30 LAB — SURGICAL PCR SCREEN
MRSA, PCR: NEGATIVE
Staphylococcus aureus: POSITIVE — AB

## 2011-09-30 LAB — APTT: aPTT: 33 seconds (ref 24–37)

## 2011-09-30 LAB — PROTIME-INR
INR: 1.18 (ref 0.00–1.49)
Prothrombin Time: 15.2 seconds (ref 11.6–15.2)

## 2011-09-30 NOTE — Progress Notes (Signed)
Patient had a stress test approximately 10 years ago, had a cardiac cath on 01/22/10 results in EPIC. Cardiologist is Dr. Clifton James at [facility] and patient had a sleep study with Dr. Shelle Iron several years ago. Patient denied wearing a BiPAP or CPAP machine at bedtime.

## 2011-10-01 NOTE — H&P (Signed)
[name] is an 66 y.o. female.   Chief Complaint: right knee pain HPI: [name] is a 66 year old female complaining of increasing right knee pain multiple years duration.  She has had a previous arthroscopy several years ago and multiple cortisone shots which have afforded her only temporary relief.  She is also had a series of Supartz which helped her temporarily as well.  She has also tried and failed Celebrex as an anti-inflammatory agent.  At this point her pain is constant and severe and is limiting to her ability to perform activities of daily living.  X-ray: Bone-on-bone end-stage DJD of the right knee.  Past Medical History  Diagnosis Date  . Hyperlipidemia   . Unspecified essential hypertension   . Osteoporosis   . Hiatal hernia   . Depressive disorder, not elsewhere classified   . Esophageal reflux   . Back pain   . Barrett's esophagus   . RLS (restless legs syndrome)   . Complication of anesthesia   . PONV (postoperative nausea and vomiting)   . Family history of anesthesia complication     Mother had severe N/V  . Migraine   . Arthritis     bilateral hands    Past Surgical History  Procedure Date  . Vaginal hysterectomy   . Oophorectomy   . Bilateral knee arthroscopy   . Shoulder surgery   . Foot surgery     Bilateral  . Arm surgery     Left  . Eye surgery     bilateral cataract removal  . Back surgery   . Colonoscopy w/ polypectomy   . Cardiac catheterization 01/22/10    clean cath    Family History  Problem Relation Age of Onset  . Colon cancer Maternal Grandfather   . Lung cancer Mother   . Esophageal cancer Brother   . Breast cancer Sister   . Diabetes      Aunt  . Dementia Father   . Colon cancer Maternal Uncle     2   Social History:  reports that she quit smoking about 36 years ago. She has never used smokeless tobacco. She reports that she drinks alcohol. She reports that she does not use illicit drugs.  Allergies:  Allergies  Allergen  Reactions  . Dextromethorphan-Guaifenesin     REACTION: nausea  . Morphine Nausea And Vomiting  . Penicillins Rash    No prescriptions prior to admission    Results for orders placed during the hospital encounter of 09/30/11 (from the past 48 hour(s))  APTT     Status: Normal   Collection Time   09/30/11  1:37 PM      Component Value Range Comment   aPTT 33  24 - 37 seconds   BASIC METABOLIC PANEL     Status: Abnormal   Collection Time   09/30/11  1:37 PM      Component Value Range Comment   Sodium 142  135 - 145 mEq/L    Potassium 3.8  3.5 - 5.1 mEq/L    Chloride 111  96 - 112 mEq/L    CO2 21  19 - 32 mEq/L    Glucose, Bld 95  70 - 99 mg/dL    BUN 21  6 - 23 mg/dL    Creatinine, Ser 0.98  0.50 - 1.10 mg/dL    Calcium 9.2  8.4 - 11.9 mg/dL    GFR calc non Af Amer 56 (*) >90 mL/min    GFR calc Af  Amer 65 (*) >90 mL/min   CBC     Status: Normal   Collection Time   09/30/11  1:37 PM      Component Value Range Comment   WBC 8.1  4.0 - 10.5 K/uL    RBC 4.07  3.87 - 5.11 MIL/uL    Hemoglobin 12.6  12.0 - 15.0 g/dL    HCT 16.1  09.6 - 04.5 %    MCV 93.9  78.0 - 100.0 fL    MCH 31.0  26.0 - 34.0 pg    MCHC 33.0  30.0 - 36.0 g/dL    RDW 40.9  81.1 - 91.4 %    Platelets 190  150 - 400 K/uL   DIFFERENTIAL     Status: Normal   Collection Time   09/30/11  1:37 PM      Component Value Range Comment   Neutrophils Relative 61  43 - 77 %    Neutro Abs 4.9  1.7 - 7.7 K/uL    Lymphocytes Relative 33  12 - 46 %    Lymphs Abs 2.6  0.7 - 4.0 K/uL    Monocytes Relative 5  3 - 12 %    Monocytes Absolute 0.4  0.1 - 1.0 K/uL    Eosinophils Relative 1  0 - 5 %    Eosinophils Absolute 0.1  0.0 - 0.7 K/uL    Basophils Relative 0  0 - 1 %    Basophils Absolute 0.0  0.0 - 0.1 K/uL   PROTIME-INR     Status: Normal   Collection Time   09/30/11  1:37 PM      Component Value Range Comment   Prothrombin Time 15.2  11.6 - 15.2 seconds    INR 1.18  0.00 - 1.49   SURGICAL PCR SCREEN     Status:  Abnormal   Collection Time   09/30/11  1:38 PM      Component Value Range Comment   MRSA, PCR NEGATIVE  NEGATIVE    Staphylococcus aureus POSITIVE (*) NEGATIVE   URINALYSIS, ROUTINE W REFLEX MICROSCOPIC     Status: Normal   Collection Time   09/30/11  1:40 PM      Component Value Range Comment   Color, Urine YELLOW  YELLOW    APPearance CLEAR  CLEAR    Specific Gravity, Urine 1.012  1.005 - 1.030    pH 5.5  5.0 - 8.0    Glucose, UA NEGATIVE  NEGATIVE mg/dL    Hgb urine dipstick NEGATIVE  NEGATIVE    Bilirubin Urine NEGATIVE  NEGATIVE    Ketones, ur NEGATIVE  NEGATIVE mg/dL    Protein, ur NEGATIVE  NEGATIVE mg/dL    Urobilinogen, UA 0.2  0.0 - 1.0 mg/dL    Nitrite NEGATIVE  NEGATIVE    Leukocytes, UA NEGATIVE  NEGATIVE MICROSCOPIC NOT DONE ON URINES WITH NEGATIVE PROTEIN, BLOOD, LEUKOCYTES, NITRITE, OR GLUCOSE <1000 mg/dL.  TYPE AND SCREEN     Status: Normal   Collection Time   09/30/11  1:45 PM      Component Value Range Comment   ABO/RH(D) O NEG      Antibody Screen NEG      Sample Expiration 10/14/2011      Dg Chest 2 View  09/30/2011  *RADIOLOGY REPORT*  Clinical Data: Preoperative respiratory exam; hiatal hernia, total knee arthroplasty  CHEST - 2 VIEW  Comparison: January 07, 2010  Findings: The cardiac silhouette, mediastinum, pulmonary vasculature are within normal limits.  Both lungs are clear. There is no acute bony abnormality.  Resorption of the distal right clavicle is again noted.  IMPRESSION: There is no evidence of acute cardiac or pulmonary process.   Original Report Authenticated By: Brandon Melnick, M.D.     Review of Systems  Constitutional: Negative.   HENT: Negative.   Eyes: Negative.   Respiratory: Negative.   Cardiovascular: Negative.        Positive for hypertension  Gastrointestinal: Negative.   Genitourinary: Negative.   Musculoskeletal: Negative.   Skin: Negative.   Neurological: Negative.   Endo/Heme/Allergies: Negative.     Psychiatric/Behavioral: Negative.     There were no vitals taken for this visit. Physical Exam  Constitutional: She appears well-nourished.  HENT:  Head: Atraumatic.  Eyes: EOM are normal.  Neck: Neck supple.  Cardiovascular: Normal rate.   Respiratory: Effort normal.  GI: Soft.  Musculoskeletal:       Right knee range of motion 0-90 degrees.  No significant effusion.  She does have a moderate valgus deformity.  Crepitation positive at 1+ with significant lateral and medial joint line pain.  Normal neurovascular status distally.  Neurological: She is alert.  Skin: Skin is warm.  Psychiatric: She has a normal mood and affect.     Assessment/Plan A: Right knee end-stage DJD P: We have discussed with Corrie Dandy proceeding with a total knee replacement on the right side to stop her pain and improve function.  I have reviewed the risks of anesthesia, infection, DVT, PE and death related to the replacement operations.  We have also discussed the length of stay in the hospital and the need for rehabilitation postop.  Karron Alvizo R 10/01/2011, 1:08 PM

## 2011-10-01 NOTE — Consult Note (Signed)
Anesthesia chart review: This is a 66 year old female who is scheduled for a right knee arthroplasty by Dr. Lubertha Basque. Dalldorf on Tuesday 05 October 2011.  Pre-op CXR report dated 30 September 2011- IMPRESSION: There is no evidence of acute cardiac or pulmonary process.  Pre-op labs results to include type and screen dated 30 September 2011-all values are within normal limits.  Pre-op EKG dated 30 September 2011 confirmed by Dr. [place] Hilty shows NSR with VR of 62bpm and RBBB. There are no contraindications for surgery with a RBBB.  May proceed with Surgery as scheduled.  Kelton Pillar. Nalee Lightle, PA-C

## 2011-10-04 DIAGNOSIS — Z1231 Encounter for screening mammogram for malignant neoplasm of breast: Secondary | ICD-10-CM | POA: Diagnosis not present

## 2011-10-04 DIAGNOSIS — Z1289 Encounter for screening for malignant neoplasm of other sites: Secondary | ICD-10-CM | POA: Diagnosis not present

## 2011-10-04 MED ORDER — VANCOMYCIN HCL 1000 MG IV SOLR
1500.0000 mg | INTRAVENOUS | Status: AC
  Administered 2011-10-05: 1500 mg via INTRAVENOUS
  Filled 2011-10-04: qty 1500

## 2011-10-05 ENCOUNTER — Encounter (HOSPITAL_COMMUNITY): Payer: Self-pay | Admitting: Anesthesiology

## 2011-10-05 ENCOUNTER — Encounter (HOSPITAL_COMMUNITY)
Admission: RE | Disposition: A | Discharge status: Skilled Nursing Facility | Payer: Self-pay | Source: Ambulatory Visit | Attending: Orthopaedic Surgery

## 2011-10-05 ENCOUNTER — Encounter (HOSPITAL_COMMUNITY): Payer: Self-pay | Admitting: *Deleted

## 2011-10-05 ENCOUNTER — Inpatient Hospital Stay (HOSPITAL_COMMUNITY)
Admission: RE | Admit: 2011-10-05 | Discharge: 2011-10-08 | DRG: 470 | Disposition: A | Discharge status: Skilled Nursing Facility | Payer: Medicare Other | Source: Ambulatory Visit | Attending: Orthopaedic Surgery | Admitting: Orthopaedic Surgery

## 2011-10-05 ENCOUNTER — Encounter (HOSPITAL_COMMUNITY): Payer: Self-pay | Admitting: General Practice

## 2011-10-05 ENCOUNTER — Inpatient Hospital Stay (HOSPITAL_COMMUNITY): Payer: Medicare Other | Admitting: Anesthesiology

## 2011-10-05 DIAGNOSIS — Z471 Aftercare following joint replacement surgery: Secondary | ICD-10-CM | POA: Diagnosis not present

## 2011-10-05 DIAGNOSIS — Z833 Family history of diabetes mellitus: Secondary | ICD-10-CM | POA: Diagnosis not present

## 2011-10-05 DIAGNOSIS — K219 Gastro-esophageal reflux disease without esophagitis: Secondary | ICD-10-CM | POA: Diagnosis present

## 2011-10-05 DIAGNOSIS — Z79899 Other long term (current) drug therapy: Secondary | ICD-10-CM

## 2011-10-05 DIAGNOSIS — Z801 Family history of malignant neoplasm of trachea, bronchus and lung: Secondary | ICD-10-CM | POA: Diagnosis not present

## 2011-10-05 DIAGNOSIS — Z800 Family history of malignant neoplasm of digestive organs: Secondary | ICD-10-CM | POA: Diagnosis not present

## 2011-10-05 DIAGNOSIS — Z5189 Encounter for other specified aftercare: Secondary | ICD-10-CM | POA: Diagnosis not present

## 2011-10-05 DIAGNOSIS — E785 Hyperlipidemia, unspecified: Secondary | ICD-10-CM | POA: Diagnosis not present

## 2011-10-05 DIAGNOSIS — M25569 Pain in unspecified knee: Secondary | ICD-10-CM | POA: Diagnosis not present

## 2011-10-05 DIAGNOSIS — M1990 Unspecified osteoarthritis, unspecified site: Secondary | ICD-10-CM | POA: Diagnosis not present

## 2011-10-05 DIAGNOSIS — IMO0002 Reserved for concepts with insufficient information to code with codable children: Secondary | ICD-10-CM | POA: Diagnosis not present

## 2011-10-05 DIAGNOSIS — G2581 Restless legs syndrome: Secondary | ICD-10-CM | POA: Diagnosis present

## 2011-10-05 DIAGNOSIS — Z888 Allergy status to other drugs, medicaments and biological substances status: Secondary | ICD-10-CM

## 2011-10-05 DIAGNOSIS — Z880 Allergy status to penicillin: Secondary | ICD-10-CM

## 2011-10-05 DIAGNOSIS — Z96659 Presence of unspecified artificial knee joint: Secondary | ICD-10-CM | POA: Diagnosis not present

## 2011-10-05 DIAGNOSIS — Z7982 Long term (current) use of aspirin: Secondary | ICD-10-CM | POA: Diagnosis not present

## 2011-10-05 DIAGNOSIS — M1710 Unilateral primary osteoarthritis, unspecified knee: Principal | ICD-10-CM | POA: Diagnosis present

## 2011-10-05 DIAGNOSIS — Z803 Family history of malignant neoplasm of breast: Secondary | ICD-10-CM | POA: Diagnosis not present

## 2011-10-05 DIAGNOSIS — Z87891 Personal history of nicotine dependence: Secondary | ICD-10-CM

## 2011-10-05 DIAGNOSIS — D62 Acute posthemorrhagic anemia: Secondary | ICD-10-CM | POA: Diagnosis not present

## 2011-10-05 DIAGNOSIS — I10 Essential (primary) hypertension: Secondary | ICD-10-CM | POA: Diagnosis present

## 2011-10-05 DIAGNOSIS — F3289 Other specified depressive episodes: Secondary | ICD-10-CM | POA: Diagnosis present

## 2011-10-05 DIAGNOSIS — K449 Diaphragmatic hernia without obstruction or gangrene: Secondary | ICD-10-CM | POA: Diagnosis present

## 2011-10-05 DIAGNOSIS — R262 Difficulty in walking, not elsewhere classified: Secondary | ICD-10-CM | POA: Diagnosis not present

## 2011-10-05 DIAGNOSIS — R279 Unspecified lack of coordination: Secondary | ICD-10-CM | POA: Diagnosis not present

## 2011-10-05 DIAGNOSIS — F329 Major depressive disorder, single episode, unspecified: Secondary | ICD-10-CM | POA: Diagnosis present

## 2011-10-05 DIAGNOSIS — Z4801 Encounter for change or removal of surgical wound dressing: Secondary | ICD-10-CM | POA: Diagnosis not present

## 2011-10-05 DIAGNOSIS — Z01812 Encounter for preprocedural laboratory examination: Secondary | ICD-10-CM

## 2011-10-05 DIAGNOSIS — G8929 Other chronic pain: Secondary | ICD-10-CM | POA: Diagnosis present

## 2011-10-05 DIAGNOSIS — G8918 Other acute postprocedural pain: Secondary | ICD-10-CM | POA: Diagnosis not present

## 2011-10-05 DIAGNOSIS — M810 Age-related osteoporosis without current pathological fracture: Secondary | ICD-10-CM | POA: Diagnosis present

## 2011-10-05 HISTORY — DX: Low back pain: M54.5

## 2011-10-05 HISTORY — DX: Low back pain, unspecified: M54.50

## 2011-10-05 HISTORY — DX: Other chronic pain: G89.29

## 2011-10-05 HISTORY — PX: TOTAL KNEE ARTHROPLASTY: SHX125

## 2011-10-05 SURGERY — ARTHROPLASTY, KNEE, TOTAL
Anesthesia: General | Site: Knee | Laterality: Right | Wound class: Clean

## 2011-10-05 MED ORDER — ROPIVACAINE HCL 5 MG/ML IJ SOLN
INTRAMUSCULAR | Status: DC | PRN
  Administered 2011-10-05: 15 mL via EPIDURAL

## 2011-10-05 MED ORDER — CHLORHEXIDINE GLUCONATE 4 % EX LIQD: 60.0000 mL | Freq: Once | CUTANEOUS | Status: DC

## 2011-10-05 MED ORDER — CEFUROXIME SODIUM 1.5 G IJ SOLR
INTRAMUSCULAR | Status: DC | PRN
  Administered 2011-10-05: 1.5 g

## 2011-10-05 MED ORDER — METOCLOPRAMIDE HCL 5 MG/ML IJ SOLN: 5.0000 mg | Freq: Three times a day (TID) | INTRAMUSCULAR | Status: DC | PRN

## 2011-10-05 MED ORDER — ONDANSETRON HCL 4 MG/2ML IJ SOLN: 4.0000 mg | Freq: Once | INTRAMUSCULAR | Status: DC | PRN

## 2011-10-05 MED ORDER — ATENOLOL 25 MG PO TABS
25.0000 mg | ORAL_TABLET | Freq: Every day | ORAL | Status: DC
  Administered 2011-10-06 – 2011-10-08 (×3): 25 mg via ORAL
  Filled 2011-10-05 (×3): qty 1

## 2011-10-05 MED ORDER — SIMVASTATIN 10 MG PO TABS
10.0000 mg | ORAL_TABLET | Freq: Every day | ORAL | Status: DC
  Administered 2011-10-05 – 2011-10-07 (×3): 10 mg via ORAL
  Filled 2011-10-05 (×4): qty 1

## 2011-10-05 MED ORDER — MIDAZOLAM HCL 2 MG/2ML IJ SOLN
INTRAMUSCULAR | Status: AC
  Filled 2011-10-05: qty 2

## 2011-10-05 MED ORDER — ONDANSETRON HCL 4 MG/2ML IJ SOLN
INTRAMUSCULAR | Status: DC | PRN
  Administered 2011-10-05: 4 mg via INTRAVENOUS

## 2011-10-05 MED ORDER — HYDROMORPHONE HCL PF 1 MG/ML IJ SOLN
INTRAMUSCULAR | Status: AC
  Administered 2011-10-05: 0.5 mg via INTRAVENOUS
  Filled 2011-10-05: qty 1

## 2011-10-05 MED ORDER — ROCURONIUM BROMIDE 100 MG/10ML IV SOLN
INTRAVENOUS | Status: DC | PRN
  Administered 2011-10-05: 50 mg via INTRAVENOUS

## 2011-10-05 MED ORDER — CEFUROXIME SODIUM 1.5 G IJ SOLR
INTRAMUSCULAR | Status: AC
  Filled 2011-10-05: qty 1.5

## 2011-10-05 MED ORDER — ROPINIROLE HCL 1 MG PO TABS
1.0000 mg | ORAL_TABLET | Freq: Every day | ORAL | Status: DC
  Administered 2011-10-05 – 2011-10-07 (×3): 1 mg via ORAL
  Filled 2011-10-05 (×4): qty 1

## 2011-10-05 MED ORDER — SCOPOLAMINE 1 MG/3DAYS TD PT72
1.0000 | MEDICATED_PATCH | TRANSDERMAL | Status: DC
  Administered 2011-10-05: 1 via TRANSDERMAL

## 2011-10-05 MED ORDER — VANCOMYCIN HCL IN DEXTROSE 1-5 GM/200ML-% IV SOLN
1000.0000 mg | Freq: Two times a day (BID) | INTRAVENOUS | Status: AC
  Administered 2011-10-06: 1000 mg via INTRAVENOUS
  Filled 2011-10-05 (×2): qty 200

## 2011-10-05 MED ORDER — BENAZEPRIL HCL 5 MG PO TABS
5.0000 mg | ORAL_TABLET | Freq: Every day | ORAL | Status: DC
  Administered 2011-10-06 – 2011-10-08 (×3): 5 mg via ORAL
  Filled 2011-10-05 (×3): qty 1

## 2011-10-05 MED ORDER — MIDAZOLAM HCL 2 MG/2ML IJ SOLN: 1.0000 mg | INTRAMUSCULAR | Status: DC | PRN

## 2011-10-05 MED ORDER — FENTANYL CITRATE 0.05 MG/ML IJ SOLN
INTRAMUSCULAR | Status: DC | PRN
  Administered 2011-10-05: 250 ug via INTRAVENOUS

## 2011-10-05 MED ORDER — PROPOFOL 10 MG/ML IV EMUL
INTRAVENOUS | Status: DC | PRN
  Administered 2011-10-05: 150 mg via INTRAVENOUS

## 2011-10-05 MED ORDER — HYDROCODONE-ACETAMINOPHEN 7.5-325 MG PO TABS
1.0000 | ORAL_TABLET | ORAL | Status: DC | PRN
  Administered 2011-10-05 – 2011-10-06 (×3): 2 via ORAL
  Filled 2011-10-05 (×3): qty 2

## 2011-10-05 MED ORDER — LACTATED RINGERS IV SOLN
INTRAVENOUS | Status: DC
  Administered 2011-10-05: 09:00:00 via INTRAVENOUS

## 2011-10-05 MED ORDER — PANTOPRAZOLE SODIUM 40 MG PO TBEC
40.0000 mg | DELAYED_RELEASE_TABLET | Freq: Every day | ORAL | Status: DC
  Administered 2011-10-06 – 2011-10-08 (×3): 40 mg via ORAL
  Filled 2011-10-05 (×3): qty 1

## 2011-10-05 MED ORDER — FENTANYL CITRATE 0.05 MG/ML IJ SOLN: 50.0000 ug | INTRAMUSCULAR | Status: DC | PRN

## 2011-10-05 MED ORDER — LACTATED RINGERS IV SOLN
INTRAVENOUS | Status: DC | PRN
  Administered 2011-10-05 (×2): via INTRAVENOUS

## 2011-10-05 MED ORDER — PHENOL 1.4 % MT LIQD: 1.0000 | OROMUCOSAL | Status: DC | PRN

## 2011-10-05 MED ORDER — METOCLOPRAMIDE HCL 10 MG PO TABS: 5.0000 mg | ORAL_TABLET | Freq: Three times a day (TID) | ORAL | Status: DC | PRN

## 2011-10-05 MED ORDER — ACETAMINOPHEN 325 MG PO TABS: 650.0000 mg | ORAL_TABLET | Freq: Four times a day (QID) | ORAL | Status: DC | PRN

## 2011-10-05 MED ORDER — HYDROMORPHONE HCL PF 1 MG/ML IJ SOLN
0.5000 mg | INTRAMUSCULAR | Status: DC | PRN
  Administered 2011-10-05 – 2011-10-08 (×5): 1 mg via INTRAVENOUS
  Filled 2011-10-05 (×5): qty 1

## 2011-10-05 MED ORDER — EPHEDRINE SULFATE 50 MG/ML IJ SOLN
INTRAMUSCULAR | Status: DC | PRN
  Administered 2011-10-05: 10 mg via INTRAVENOUS

## 2011-10-05 MED ORDER — DEXAMETHASONE SODIUM PHOSPHATE 4 MG/ML IJ SOLN
INTRAMUSCULAR | Status: DC | PRN
  Administered 2011-10-05: 8 mg via INTRAVENOUS

## 2011-10-05 MED ORDER — FAMOTIDINE 20 MG PO TABS
20.0000 mg | ORAL_TABLET | Freq: Every day | ORAL | Status: DC
  Administered 2011-10-05 – 2011-10-07 (×3): 20 mg via ORAL
  Filled 2011-10-05 (×4): qty 1

## 2011-10-05 MED ORDER — FLEET ENEMA 7-19 GM/118ML RE ENEM: 1.0000 | ENEMA | Freq: Once | RECTAL | Status: AC | PRN

## 2011-10-05 MED ORDER — SENNOSIDES-DOCUSATE SODIUM 8.6-50 MG PO TABS: 1.0000 | ORAL_TABLET | Freq: Every evening | ORAL | Status: DC | PRN

## 2011-10-05 MED ORDER — CYCLOBENZAPRINE HCL 10 MG PO TABS
10.0000 mg | ORAL_TABLET | Freq: Three times a day (TID) | ORAL | Status: DC | PRN
  Administered 2011-10-06 (×2): 10 mg via ORAL
  Filled 2011-10-05 (×2): qty 1

## 2011-10-05 MED ORDER — FENTANYL CITRATE 0.05 MG/ML IJ SOLN
INTRAMUSCULAR | Status: AC
  Filled 2011-10-05: qty 2

## 2011-10-05 MED ORDER — ONDANSETRON HCL 4 MG/2ML IJ SOLN: 4.0000 mg | Freq: Four times a day (QID) | INTRAMUSCULAR | Status: DC | PRN

## 2011-10-05 MED ORDER — BUPIVACAINE HCL (PF) 0.5 % IJ SOLN
INTRAMUSCULAR | Status: DC | PRN
  Administered 2011-10-05: 25 mL

## 2011-10-05 MED ORDER — DOCUSATE SODIUM 100 MG PO CAPS
100.0000 mg | ORAL_CAPSULE | Freq: Two times a day (BID) | ORAL | Status: DC
  Administered 2011-10-06 – 2011-10-08 (×5): 100 mg via ORAL
  Filled 2011-10-05 (×7): qty 1

## 2011-10-05 MED ORDER — ASPIRIN EC 325 MG PO TBEC
325.0000 mg | DELAYED_RELEASE_TABLET | Freq: Two times a day (BID) | ORAL | Status: DC
  Administered 2011-10-05 – 2011-10-08 (×6): 325 mg via ORAL
  Filled 2011-10-05 (×10): qty 1

## 2011-10-05 MED ORDER — LACTATED RINGERS IV SOLN: INTRAVENOUS | Status: DC

## 2011-10-05 MED ORDER — ACETAMINOPHEN 650 MG RE SUPP: 650.0000 mg | Freq: Four times a day (QID) | RECTAL | Status: DC | PRN

## 2011-10-05 MED ORDER — ROPINIROLE HCL 0.5 MG PO TABS
0.5000 mg | ORAL_TABLET | Freq: Every day | ORAL | Status: DC
  Administered 2011-10-05 – 2011-10-07 (×3): 0.5 mg via ORAL
  Filled 2011-10-05 (×4): qty 1

## 2011-10-05 MED ORDER — MENTHOL 3 MG MT LOZG: 1.0000 | LOZENGE | OROMUCOSAL | Status: DC | PRN

## 2011-10-05 MED ORDER — TRAZODONE HCL 50 MG PO TABS
50.0000 mg | ORAL_TABLET | Freq: Every day | ORAL | Status: DC
  Administered 2011-10-05 – 2011-10-07 (×3): 50 mg via ORAL
  Filled 2011-10-05 (×4): qty 1

## 2011-10-05 MED ORDER — SODIUM CHLORIDE 0.9 % IR SOLN
Status: DC | PRN
  Administered 2011-10-05: 3000 mL
  Administered 2011-10-05: 1000 mL

## 2011-10-05 MED ORDER — TOPIRAMATE 100 MG PO TABS
400.0000 mg | ORAL_TABLET | Freq: Every day | ORAL | Status: DC
  Administered 2011-10-05 – 2011-10-07 (×3): 400 mg via ORAL
  Filled 2011-10-05 (×4): qty 4

## 2011-10-05 MED ORDER — ONDANSETRON HCL 4 MG PO TABS: 4.0000 mg | ORAL_TABLET | Freq: Four times a day (QID) | ORAL | Status: DC | PRN

## 2011-10-05 MED ORDER — GLYCOPYRROLATE 0.2 MG/ML IJ SOLN
INTRAMUSCULAR | Status: DC | PRN
  Administered 2011-10-05: 0.4 mg via INTRAVENOUS

## 2011-10-05 MED ORDER — MUPIROCIN 2 % EX OINT
TOPICAL_OINTMENT | Freq: Two times a day (BID) | CUTANEOUS | Status: DC
  Administered 2011-10-05 – 2011-10-08 (×4): via NASAL
  Filled 2011-10-05: qty 22

## 2011-10-05 MED ORDER — NEOSTIGMINE METHYLSULFATE 1 MG/ML IJ SOLN
INTRAMUSCULAR | Status: DC | PRN
  Administered 2011-10-05: 3 mg via INTRAVENOUS

## 2011-10-05 MED ORDER — ACETAMINOPHEN 10 MG/ML IV SOLN
INTRAVENOUS | Status: DC | PRN
  Administered 2011-10-05: 1000 mg via INTRAVENOUS

## 2011-10-05 MED ORDER — ALUM & MAG HYDROXIDE-SIMETH 200-200-20 MG/5ML PO SUSP: 30.0000 mL | ORAL | Status: DC | PRN

## 2011-10-05 MED ORDER — ACETAMINOPHEN 10 MG/ML IV SOLN
INTRAVENOUS | Status: AC
  Filled 2011-10-05: qty 100

## 2011-10-05 MED ORDER — LIDOCAINE HCL (CARDIAC) 20 MG/ML IV SOLN
INTRAVENOUS | Status: DC | PRN
  Administered 2011-10-05: 50 mg via INTRAVENOUS

## 2011-10-05 MED ORDER — HYDROMORPHONE HCL PF 1 MG/ML IJ SOLN
0.2500 mg | INTRAMUSCULAR | Status: DC | PRN
  Administered 2011-10-05 (×3): 0.5 mg via INTRAVENOUS

## 2011-10-05 MED ORDER — DIPHENHYDRAMINE HCL 12.5 MG/5ML PO ELIX: 12.5000 mg | ORAL_SOLUTION | ORAL | Status: DC | PRN

## 2011-10-05 SURGICAL SUPPLY — 68 items
AUTOTRANSFUSION W/QD PVC DRAIN (AUTOTRANSFUSION) ×1 IMPLANT
BANDAGE ELASTIC 4 VELCRO ST LF (GAUZE/BANDAGES/DRESSINGS) ×1 IMPLANT
BANDAGE ESMARK 6X9 LF (GAUZE/BANDAGES/DRESSINGS) ×1 IMPLANT
BANDAGE GAUZE ELAST BULKY 4 IN (GAUZE/BANDAGES/DRESSINGS) ×3 IMPLANT
BLADE SAGITTAL 25.0X1.19X90 (BLADE) ×2 IMPLANT
BLADE SURG ROTATE 9660 (MISCELLANEOUS) IMPLANT
BNDG CMPR 9X6 STRL LF SNTH (GAUZE/BANDAGES/DRESSINGS) ×1
BNDG CMPR MED 10X6 ELC LF (GAUZE/BANDAGES/DRESSINGS) ×1
BNDG ELASTIC 6X10 VLCR STRL LF (GAUZE/BANDAGES/DRESSINGS) ×2 IMPLANT
BNDG ESMARK 6X9 LF (GAUZE/BANDAGES/DRESSINGS) ×2
BOWL SMART MIX CTS (DISPOSABLE) ×2 IMPLANT
CEMENT HV SMART SET (Cement) ×4 IMPLANT
CLOTH BEACON ORANGE TIMEOUT ST (SAFETY) ×2 IMPLANT
COVER SURGICAL LIGHT HANDLE (MISCELLANEOUS) ×2 IMPLANT
CUFF TOURNIQUET SINGLE 34IN LL (TOURNIQUET CUFF) ×2 IMPLANT
CUFF TOURNIQUET SINGLE 44IN (TOURNIQUET CUFF) IMPLANT
DRAPE EXTREMITY T 121X128X90 (DRAPE) ×2 IMPLANT
DRAPE PROXIMA HALF (DRAPES) ×2 IMPLANT
DRAPE U-SHAPE 47X51 STRL (DRAPES) ×2 IMPLANT
DRSG ADAPTIC 3X8 NADH LF (GAUZE/BANDAGES/DRESSINGS) ×2 IMPLANT
DRSG PAD ABDOMINAL 8X10 ST (GAUZE/BANDAGES/DRESSINGS) ×2 IMPLANT
DURAPREP 26ML APPLICATOR (WOUND CARE) ×2 IMPLANT
ELECT REM PT RETURN 9FT ADLT (ELECTROSURGICAL) ×2
ELECTRODE REM PT RTRN 9FT ADLT (ELECTROSURGICAL) ×1 IMPLANT
EVACUATOR 1/8 PVC DRAIN (DRAIN) ×1 IMPLANT
FACESHIELD LNG OPTICON STERILE (SAFETY) ×4 IMPLANT
GLOVE BIO SURGEON STRL SZ7.5 (GLOVE) ×1 IMPLANT
GLOVE BIO SURGEON STRL SZ8 (GLOVE) ×1 IMPLANT
GLOVE BIO SURGEON STRL SZ8.5 (GLOVE) ×2 IMPLANT
GLOVE BIOGEL PI IND STRL 8 (GLOVE) ×1 IMPLANT
GLOVE BIOGEL PI IND STRL 8.5 (GLOVE) ×1 IMPLANT
GLOVE BIOGEL PI INDICATOR 8 (GLOVE) ×1
GLOVE BIOGEL PI INDICATOR 8.5 (GLOVE) ×1
GLOVE ECLIPSE 7.5 STRL STRAW (GLOVE) ×1 IMPLANT
GLOVE SS BIOGEL STRL SZ 8 (GLOVE) ×1 IMPLANT
GLOVE SUPERSENSE BIOGEL SZ 8 (GLOVE) ×1
GLOVE SURG SS PI 6.5 STRL IVOR (GLOVE) ×1 IMPLANT
GOWN PREVENTION PLUS XLARGE (GOWN DISPOSABLE) ×2 IMPLANT
GOWN PREVENTION PLUS XXLARGE (GOWN DISPOSABLE) ×2 IMPLANT
GOWN SRG XL XLNG 56XLVL 4 (GOWN DISPOSABLE) IMPLANT
GOWN STRL NON-REIN LRG LVL3 (GOWN DISPOSABLE) ×2 IMPLANT
GOWN STRL NON-REIN XL XLG LVL4 (GOWN DISPOSABLE) ×2
HANDPIECE INTERPULSE COAX TIP (DISPOSABLE) ×2
HOOD PEEL AWAY FACE SHEILD DIS (HOOD) ×2 IMPLANT
IMMOBILIZER KNEE 20 (SOFTGOODS)
IMMOBILIZER KNEE 20 THIGH 36 (SOFTGOODS) IMPLANT
IMMOBILIZER KNEE 22 UNIV (SOFTGOODS) ×2 IMPLANT
IMMOBILIZER KNEE 24 THIGH 36 (MISCELLANEOUS) IMPLANT
IMMOBILIZER KNEE 24 UNIV (MISCELLANEOUS)
KIT BASIN OR (CUSTOM PROCEDURE TRAY) ×2 IMPLANT
KIT ROOM TURNOVER OR (KITS) ×2 IMPLANT
MANIFOLD NEPTUNE II (INSTRUMENTS) ×2 IMPLANT
NS IRRIG 1000ML POUR BTL (IV SOLUTION) ×2 IMPLANT
PACK TOTAL JOINT (CUSTOM PROCEDURE TRAY) ×2 IMPLANT
PAD ARMBOARD 7.5X6 YLW CONV (MISCELLANEOUS) ×4 IMPLANT
SET HNDPC FAN SPRY TIP SCT (DISPOSABLE) ×1 IMPLANT
SPONGE GAUZE 4X4 12PLY (GAUZE/BANDAGES/DRESSINGS) ×2 IMPLANT
STAPLER VISISTAT 35W (STAPLE) ×2 IMPLANT
SUCTION FRAZIER TIP 10 FR DISP (SUCTIONS) ×2 IMPLANT
SUT VIC AB 0 CT1 27 (SUTURE) ×6
SUT VIC AB 0 CT1 27XBRD ANBCTR (SUTURE) ×2 IMPLANT
SUT VIC AB 2-0 CT1 27 (SUTURE) ×4
SUT VIC AB 2-0 CT1 TAPERPNT 27 (SUTURE) ×2 IMPLANT
SUT VLOC 180 0 24IN GS25 (SUTURE) ×2 IMPLANT
TOWEL OR 17X24 6PK STRL BLUE (TOWEL DISPOSABLE) ×2 IMPLANT
TOWEL OR 17X26 10 PK STRL BLUE (TOWEL DISPOSABLE) ×2 IMPLANT
TRAY FOLEY CATH 14FR (SET/KITS/TRAYS/PACK) ×2 IMPLANT
WATER STERILE IRR 1000ML POUR (IV SOLUTION) ×4 IMPLANT

## 2011-10-05 NOTE — Interval H&P Note (Signed)
History and Physical Interval Note:  10/05/2011 9:29 AM  [name]  has presented today for surgery, with the diagnosis of RIGHT KNEE DEGENERATIVE JOINT DISEASE  The various methods of treatment have been discussed with the patient and family. After consideration of risks, benefits and other options for treatment, the patient has consented to  Procedure(s) (LRB): TOTAL KNEE ARTHROPLASTY (Right) as a surgical intervention .  The patient's history has been reviewed, patient examined, no change in status, stable for surgery.  I have reviewed the patient's chart and labs.  Questions were answered to the patient's satisfaction.     [name] G

## 2011-10-05 NOTE — Progress Notes (Signed)
Orthopedic Tech Progress Note Patient Details:  [name] 1945/05/08 [id number] CPM applied to Right LE with appropriate settings; OHF with trapeze bar applied to bed CPM Right Knee CPM Right Knee: On Right Knee Flexion (Degrees): 60  Right Knee Extension (Degrees): 0    [name] 10/05/2011, 1:04 PM

## 2011-10-05 NOTE — Op Note (Signed)
PREOP DIAGNOSIS: DJD RIGHT KNEE POSTOP DIAGNOSIS: DJD RIGHT KNEE PROCEDURE: RIGHT TKR ANESTHESIA: General and block ATTENDING SURGEON: Dalilah Curlin G ASSISTANT: Lindwood Qua PA  INDICATIONS FOR PROCEDURE: [name] is a 66 y.o. female who has struggled for a long time with pain due to degenerative arthritis of the right knee.  The patient has failed many conservative non-operative measures and at this point has pain which limits the ability to sleep and walk.  The patient is offered total knee replacement.  Informed operative consent was obtained after discussion of possible risks of anesthesia, infection, neurovascular injury, DVT, and death.  The importance of the post-operative rehabilitation protocol to optimize result was stressed extensively with the patient.  SUMMARY OF FINDINGS AND PROCEDURE:  [name] was taken to the operative suite where under the above anesthesia a right knee replacement was performed.  There were advanced degenerative changes and the bone quality was good.  We used the DePuy system and placed size standard pllus femur, 4 MBT tibia, 35 mm all polyethylene patella, and a size 10 spacer.  I did include Zinacef antibiotic in the cement.  The patient was admitted for appropriate post-op care to include perioperative antibiotics and mechanical and pharmacologic measures for DVT prophylaxis.  DESCRIPTION OF PROCEDURE:  [name] was taken to the operative suite where the above anesthesia was applied.  The patient was positioned supine and prepped and draped in normal sterile fashion.  An appropriate time out was performed.  After the administration of Vancomycin pre-op antibiotic a standard longitudinal incision was made on the anterior knee.  Dissection was carried down to the extensor mechanism.  All appropriate anti-infective measures were used including the pre-operative antibiotic, betadine impregnated drape, and closed hooded exhaust systems for each member  of the surgical team.  A medial parapatellar incision was made in the extensor mechanism and the knee cap flipped and the knee flexed.  Some residual meniscal tissues were removed along with any remaining ACL/PCL tissue.  A guide was placed on the tibia and a flat cut was made on it's superior surface.  An intramedullary guide was placed in the femur and was utilized to make anterior and posterior cuts creating an appropriate flexion gap.  A second intramedullary guide was placed in the femur to make a distal cut properly balancing the knee with an extension gap equal to the flexion gap.  The three bones sized to the above mentioned sizes and the appropriate guides were placed and utilized.  A trial reduction was done and the knee easily came to full extension and the patella tracked well on flexion.  The trial components were removed and all bones were cleaned with pulsatile lavage and then dried thoroughly.  Cement was mixed including antibiotic and was pressurized onto the bones followed by placement of the aforementioned components.  Excess cement was trimmed and pressure was held on the components until the cement had hardened.  The tourniquet was deflated and a small amount of bleeding was controlled with cautery and pressure.  The knee was irrigated thoroughly.  The extensor mechanism was re-approximated with V-loc suture in running fashion.  The knee was flexed and the repair was solid.  The subcutaneous tissues were re-approximated with #0 and #2-0 vicryl and the skin closed with staples.  A sterile dressing was applied.  Intraoperative fluids, EBL, and tourniquet time can be obtained from anesthesia records.  DISPOSITION:  The patient was taken to recovery room in stable condition and  admitted for appropriate post-op care to include peri-operative antibiotic and DVT prophylaxis with mechanical and pharmacologic measures.  Keilana Morlock G 10/05/2011, 11:42 AM

## 2011-10-05 NOTE — Preoperative (Signed)
Beta Blockers   Reason not to administer Beta Blockers:Not Applicable 

## 2011-10-05 NOTE — Anesthesia Preprocedure Evaluation (Addendum)
Anesthesia Evaluation  Patient identified by MRN, date of birth, ID band Patient awake    Reviewed: Allergy & Precautions, H&P , NPO status , Patient's Chart, lab work & pertinent test results, reviewed documented beta blocker date and time   History of Anesthesia Complications (+) PONV  Airway Mallampati: I TM Distance: >3 FB Neck ROM: Full    Dental  (+) Teeth Intact, Dental Advisory Given and Caps,    Pulmonary  breath sounds clear to auscultation        Cardiovascular hypertension, Pt. on medications and Pt. on home beta blockers Rhythm:Regular Rate:Normal     Neuro/Psych  Headaches, Depression  Neuromuscular disease    GI/Hepatic hiatal hernia, GERD-  Medicated and Controlled,  Endo/Other  Morbid obesity  Renal/GU      Musculoskeletal   Abdominal   Peds  Hematology   Anesthesia Other Findings   Reproductive/Obstetrics                         Anesthesia Physical Anesthesia Plan  ASA: III  Anesthesia Plan: General   Post-op Pain Management:    Induction: Intravenous  Airway Management Planned: Oral ETT  Additional Equipment:   Intra-op Plan:   Post-operative Plan: Extubation in OR  Informed Consent: I have reviewed the patients History and Physical, chart, labs and discussed the procedure including the risks, benefits and alternatives for the proposed anesthesia with the patient or authorized representative who has indicated his/her understanding and acceptance.   Dental advisory given  Plan Discussed with: CRNA, Anesthesiologist and Surgeon  Anesthesia Plan Comments:         Anesthesia Quick Evaluation

## 2011-10-05 NOTE — Anesthesia Procedure Notes (Signed)
Anesthesia Regional Block:  Femoral nerve block  Pre-Anesthetic Checklist: ,, timeout performed, Correct Patient, Correct Site, Correct Laterality, Correct Procedure, Correct Position, site marked, Risks and benefits discussed,  Surgical consent,  Pre-op evaluation,  At surgeon's request and post-op pain management  Laterality: Right and Lower  Prep: chloraprep       Needles:  Injection technique: Single-shot  Needle Type: Echogenic Needle     Needle Length: 9cm  Needle Gauge: 21    Additional Needles:  Procedures: ultrasound guided Femoral nerve block Narrative:  Start time: 10/05/2011 9:10 AM End time: 10/05/2011 9:19 AM Injection made incrementally with aspirations every 5 mL.  Performed by: Personally  Anesthesiologist: Sheldon Silvan, MD  Additional Notes: Marcaine 0.5% with EPI 1:200000  Ropivicaine 0.5% given at saphenous nerve by [place] after femoral block performed.  Femoral nerve block

## 2011-10-05 NOTE — Transfer of Care (Signed)
Immediate Anesthesia Transfer of Care Note  Patient: [name]  Procedure(s) Performed: Procedure(s) (LRB): TOTAL KNEE ARTHROPLASTY (Right)  Patient Location: PACU  Anesthesia Type: GA combined with regional for post-op pain  Level of Consciousness: awake, alert  and oriented  Airway & Oxygen Therapy: Patient Spontanous Breathing and Patient connected to nasal cannula oxygen  Post-op Assessment: Report given to PACU RN and Post -op Vital signs reviewed and stable  Post vital signs: Reviewed and stable  Complications: No apparent anesthesia complications

## 2011-10-05 NOTE — Progress Notes (Signed)
UR COMPLETED  

## 2011-10-05 NOTE — Anesthesia Postprocedure Evaluation (Signed)
  Anesthesia Post-op Note  Patient: [name]  Procedure(s) Performed: Procedure(s) (LRB): TOTAL KNEE ARTHROPLASTY (Right)  Patient Location: PACU  Anesthesia Type: GA combined with regional for post-op pain  Level of Consciousness: awake, alert  and oriented  Airway and Oxygen Therapy: Patient Spontanous Breathing and Patient connected to nasal cannula oxygen  Post-op Pain: mild  Post-op Assessment: Post-op Vital signs reviewed, Patient's Cardiovascular Status Stable, Respiratory Function Stable, Patent Airway, No signs of Nausea or vomiting and Pain level controlled  Post-op Vital Signs: Reviewed and stable  Complications: No apparent anesthesia complications

## 2011-10-06 ENCOUNTER — Encounter (HOSPITAL_COMMUNITY): Payer: Self-pay | Admitting: Orthopaedic Surgery

## 2011-10-06 LAB — CBC
HCT: 30.6 % — ABNORMAL LOW (ref 36.0–46.0)
Hemoglobin: 10.2 g/dL — ABNORMAL LOW (ref 12.0–15.0)
MCH: 31.2 pg (ref 26.0–34.0)
MCHC: 33.3 g/dL (ref 30.0–36.0)
MCV: 93.6 fL (ref 78.0–100.0)
Platelets: 173 10*3/uL (ref 150–400)
RBC: 3.27 MIL/uL — ABNORMAL LOW (ref 3.87–5.11)
RDW: 13.1 % (ref 11.5–15.5)
WBC: 9.5 10*3/uL (ref 4.0–10.5)

## 2011-10-06 LAB — BASIC METABOLIC PANEL
BUN: 17 mg/dL (ref 6–23)
CO2: 24 mEq/L (ref 19–32)
Calcium: 8.5 mg/dL (ref 8.4–10.5)
Chloride: 110 mEq/L (ref 96–112)
Creatinine, Ser: 0.85 mg/dL (ref 0.50–1.10)
GFR calc Af Amer: 80 mL/min — ABNORMAL LOW (ref >90)
GFR calc non Af Amer: 69 mL/min — ABNORMAL LOW (ref >90)
Glucose, Bld: 112 mg/dL — ABNORMAL HIGH (ref 70–99)
Potassium: 4.1 mEq/L (ref 3.5–5.1)
Sodium: 141 mEq/L (ref 135–145)

## 2011-10-06 MED ORDER — SODIUM CHLORIDE 0.9 % IJ SOLN: 3.0000 mL | INTRAMUSCULAR | Status: DC | PRN

## 2011-10-06 MED ORDER — SODIUM CHLORIDE 0.9 % IJ SOLN: 3.0000 mL | Freq: Two times a day (BID) | INTRAMUSCULAR | Status: DC

## 2011-10-06 NOTE — Evaluation (Signed)
Occupational Therapy Evaluation Patient Details Name: MAITLAND MUHLBAUER MRN: [id number] DOB: 06/03/1945 Today's Date: 10/06/2011 Time: 1005-1030 OT Time Calculation (min): 25 min  OT Assessment / Plan / Recommendation Clinical Impression  Pt s/p Rt TKA and presents with pain, decreased activity tolerance as well as decr balance impacting pt's PLOF. Pt will benefit from skilled OT in the acute setting to maximize I with ADL and ADL mobility.     OT Assessment  Patient needs continued OT Services    Follow Up Recommendations  Skilled nursing facility    Barriers to Discharge Decreased caregiver support    Equipment Recommendations  3 in 1 bedside comode    Recommendations for Other Services    Frequency  Min 2X/week    Precautions / Restrictions Precautions Precautions: Knee Required Braces or Orthoses: Knee Immobilizer - Right Restrictions Weight Bearing Restrictions: Yes RLE Weight Bearing: Weight bearing as tolerated   Pertinent Vitals/Pain Pt reports 7/10 rt knee pain. Pt premedicated. Repositioned for pain relief    ADL  Lower Body Dressing: Performed;+1 Total assistance (don socks) Where Assessed - Lower Body Dressing: Supported sitting (steadied self with rail) Toilet Transfer: Simulated;+2 Total assistance Toilet Transfer: Patient Percentage: 50% Toilet Transfer Method: Sit to Barista:  (from bed with UE assist) Toileting - Clothing Manipulation and Hygiene: Simulated;Moderate assistance Where Assessed - Toileting Clothing Manipulation and Hygiene: Standing Equipment Used: Gait belt;Rolling walker;Knee Immobilizer Transfers/Ambulation Related to ADLs: +2 total A for sit to stand- majority of assist needed for intitial phases of transfer. Pt Min a with RW ambulation bed to chair    OT Diagnosis: Generalized weakness;Acute pain  OT Problem List: Decreased activity tolerance;Impaired balance (sitting and/or standing);Decreased knowledge of use of  DME or AE;Decreased knowledge of precautions;Pain OT Treatment Interventions: Self-care/ADL training;DME and/or AE instruction;Therapeutic activities;Patient/family education;Balance training   OT Goals Acute Rehab OT Goals OT Goal Formulation: With patient Time For Goal Achievement: 10/20/11 Potential to Achieve Goals: Good ADL Goals Pt Will Perform Grooming: Standing at sink;with supervision ADL Goal: Grooming - Progress: Goal set today Pt Will Perform Upper Body Dressing: with set-up;Sitting, bed;Sitting, chair ADL Goal: Upper Body Dressing - Progress: Goal set today Pt Will Perform Lower Body Dressing: with min assist;Sit to stand from bed;Sit to stand from chair (AE prn) ADL Goal: Lower Body Dressing - Progress: Goal set today Pt Will Transfer to Toilet: with modified independence;Ambulation;with DME ADL Goal: Toilet Transfer - Progress: Goal set today Pt Will Perform Toileting - Clothing Manipulation: Independently;Standing ADL Goal: Toileting - Clothing Manipulation - Progress: Goal set today Pt Will Perform Tub/Shower Transfer: Shower transfer;with min assist;Ambulation;with DME ADL Goal: Tub/Shower Transfer - Progress: Goal set today  Visit Information  Last OT Received On: 10/06/11 Assistance Needed: +2 PT/OT Co-Evaluation/Treatment: Yes    Subjective Data  Subjective: I can't wait to get out of this bed Patient Stated Goal: Return home   Prior Functioning  Vision/Perception  Home Living Lives With: Son Available Help at Discharge: Family Type of Home: House Home Access: Other (comment);Ramped entrance;Stairs to enter (threshold) Entrance Stairs-Number of Steps: 2 Home Layout: One level Bathroom Shower/Tub: Health visitor: Handicapped height Home Adaptive Equipment: Walker - rolling Prior Function Level of Independence: Independent Able to Take Stairs?: Yes Driving: Yes Vocation: Unemployed Communication Communication: No  difficulties Dominant Hand: Right      Cognition  Overall Cognitive Status: Appears within functional limits for tasks assessed/performed Arousal/Alertness: Awake/alert Orientation Level: Oriented X4 / Intact;Appears intact for tasks  assessed Behavior During Session: [facility] for tasks performed    Extremity/Trunk Assessment Right Upper Extremity Assessment RUE ROM/Strength/Tone: [facility] for tasks assessed RUE Coordination: WFL - gross/fine motor Left Upper Extremity Assessment LUE ROM/Strength/Tone: WFL for tasks assessed LUE Coordination: WFL - gross/fine motor Right Lower Extremity Assessment RLE ROM/Strength/Tone: Deficits;Unable to fully assess;Due to pain;Due to precautions RLE ROM/Strength/Tone Deficits: 10-60 degrees Left Lower Extremity Assessment LLE ROM/Strength/Tone: [facility] for tasks assessed   Mobility  Shoulder Instructions  Bed Mobility Bed Mobility: Supine to Sit;Sitting - Scoot to Edge of Bed Supine to Sit: 4: Min assist Sitting - Scoot to [place] of Bed: 4: Min assist Details for Bed Mobility Assistance: min assist for RLE Transfers Sit to Stand: 1: +2 Total assist;From bed;With upper extremity assist Sit to Stand: Patient Percentage: 50% Stand to Sit: 4: Min assist;To chair/3-in-1;With armrests;With upper extremity assist Details for Transfer Assistance: VC's for hand placement       Exercise     Balance     End of Session OT - End of Session Equipment Utilized During Treatment: Gait belt;Right knee immobilizer Activity Tolerance: Patient tolerated treatment well Patient left: in chair;with call bell/phone within reach Nurse Communication: Mobility status CPM Right Knee CPM Right Knee: Off  GO     Jamerion Cabello 10/06/2011, 1:31 PM

## 2011-10-06 NOTE — Progress Notes (Signed)
Subjective: 1 Day Post-Op Procedure(s) (LRB): TOTAL KNEE ARTHROPLASTY (Right)  Activity level:  In cpm Diet tolerance:  Eating  Voiding:   Foley to come out now Patient reports pain as 3 on 0-10 scale.    Objective: Vital signs in last 24 hours: Temp:  [97.3 F (36.3 C)-98.5 F (36.9 C)] 98.5 F (36.9 C) (08/28 0640) Pulse Rate:  [53-103] 103  (08/28 0640) Resp:  [11-20] 18  (08/28 0640) BP: (101-148)/(51-83) 129/54 mmHg (08/28 0640) SpO2:  [98 %-100 %] 99 % (08/28 0640) Weight:  [103.874 kg (229 lb)] 103.874 kg (229 lb) (08/27 1818)  Labs:  Basename 10/06/11 0600  HGB 10.2*    Basename 10/06/11 0600  WBC 9.5  RBC 3.27*  HCT 30.6*  PLT 173    Basename 10/06/11 0600  NA 141  K 4.1  CL 110  CO2 24  BUN 17  CREATININE 0.85  GLUCOSE 112*  CALCIUM 8.5   No results found for this basename: LABPT:2,INR:2 in the last 72 hours  Physical Exam:  Neurologically intact ABD soft Neurovascular intact Sensation intact distally Intact pulses distally Dorsiflexion/Plantar flexion intact No cellulitis present Compartment soft Dressing dry  Assessment/Plan:  1 Day Post-Op Procedure(s) (LRB): TOTAL KNEE ARTHROPLASTY (Right) Advance diet Up with therapy D/C IV fluids Discharge to SNF fri , [place] [facility]    Michalene Debruler R 10/06/2011, 8:10 AM

## 2011-10-06 NOTE — Progress Notes (Signed)
Physical Therapy Treatment Patient Details Name: [name] MRN: [id number] DOB: 1945/03/26 Today's Date: 10/06/2011 Time: 1478-2956 PT Time Calculation (min): 28 min  PT Assessment / Plan / Recommendation Comments on Treatment Session  Pt appears to have increased pain this afternoon. Pt able to ambulate further with max cues for encouragement and ambulation technique. Pt will continue to benefit from skilled PT to improve overall functional mobility.    Follow Up Recommendations  Skilled nursing facility    Barriers to Discharge        Equipment Recommendations  3 in 1 bedside comode    Recommendations for Other Services    Frequency 7X/week   Plan Discharge plan remains appropriate    Precautions / Restrictions Precautions Precautions: Knee Required Braces or Orthoses: Knee Immobilizer - Right Restrictions Weight Bearing Restrictions: Yes RLE Weight Bearing: Weight bearing as tolerated   Pertinent Vitals/Pain 7/10    Mobility  Bed Mobility Bed Mobility: Not assessed Transfers Transfers: Sit to Stand;Stand to Sit Sit to Stand: With upper extremity assist;From chair/3-in-1;3: Mod assist Stand to Sit: 4: Min assist;To chair/3-in-1;With armrests;With upper extremity assist Details for Transfer Assistance: VC's for hand placement Ambulation/Gait Ambulation/Gait Assistance: 4: Min guard Ambulation Distance (Feet): 50 Feet Assistive device: Rolling walker Ambulation/Gait Assistance Details: VC's for sequencing and attention Gait Pattern: Step-to pattern;Decreased stride length;Decreased weight shift to right;Antalgic Gait velocity: decreased General Gait Details: +2 for chair follow secondary to fatigue and instability     PT Goals Acute Rehab PT Goals Potential to Achieve Goals: Good Pt will go Supine/Side to Sit: with modified independence PT Goal: Supine/Side to Sit - Progress: Progressing toward goal Pt will go Sit to Stand: with modified independence PT  Goal: Sit to Stand - Progress: Progressing toward goal Pt will Ambulate: >150 feet;with modified independence;with rolling walker PT Goal: Ambulate - Progress: Progressing toward goal  Visit Information  Last PT Received On: 10/06/11 Assistance Needed: +2    Subjective Data  Subjective: My knee is feeling very sore Patient Stated Goal: to walk   Cognition  Overall Cognitive Status: Appears within functional limits for tasks assessed/performed Arousal/Alertness: Awake/alert Orientation Level: Oriented X4 / Intact;Appears intact for tasks assessed Behavior During Session: [facility] for tasks performed       End of Session PT - End of Session Equipment Utilized During Treatment: Gait belt Activity Tolerance: Patient tolerated treatment well;Patient limited by pain Patient left: in chair;with call bell/phone within reach Nurse Communication: Mobility status   GP     Fabio Asa 10/06/2011, 3:33 PM Charlotte Crumb, PT DPT  [phone]

## 2011-10-06 NOTE — Progress Notes (Signed)
CARE MANAGEMENT NOTE 10/06/2011  Patient:  [name], [name]   Account Number:  [id number]  Date Initiated:  10/06/2011  Documentation initiated by:  Vance Peper  Subjective/Objective Assessment:   66 yr old female s/p right total knee arthroplasty.     Action/Plan:   Patient was preoperatively setup with [facility]. Patient will need shortterm rehab at SNF, Lovette Cliche, Social Worker aware.   Anticipated DC Date:  10/08/2011   Anticipated DC Plan:  SKILLED NURSING FACILITY  In-house referral  Clinical Social Worker      DC Planning Services  CM consult      Choice offered to / List presented to:             Status of service:  Completed, signed off Medicare Important Message given?   (If response is "NO", the following Medicare IM given date fields will be blank) Date Medicare IM given:   Date Additional Medicare IM given:    Discharge Disposition:  SKILLED NURSING FACILITY  Per UR Regulation:    If discussed at Long Length of Stay Meetings, dates discussed:    Comments:

## 2011-10-06 NOTE — Evaluation (Signed)
Physical Therapy Evaluation Patient Details Name: [name] MRN: [id number] DOB: Sep 30, 1945 Today's Date: 10/06/2011 Time: 1010-1031 PT Time Calculation (min): 21 min  PT Assessment / Plan / Recommendation Clinical Impression  Pt is a 66 y.o. female s/p R TKA. Pt presents with deficits in functional mobility secondary to pain, decreased ROM, decreased and activity tolerance.      PT Assessment  Patient needs continued PT services    Follow Up Recommendations  Skilled nursing facility    Barriers to Discharge Decreased caregiver support Son works during the day    Equipment Recommendations  3 in 1 bedside comode    Recommendations for Other Services     Frequency 7X/week    Precautions / Restrictions Precautions Precautions: Knee Required Braces or Orthoses: Knee Immobilizer - Right Restrictions Weight Bearing Restrictions: Yes RLE Weight Bearing: Weight bearing as tolerated   Pertinent Vitals/Pain 7/10      Mobility  Bed Mobility Bed Mobility: Supine to Sit;Sitting - Scoot to Edge of Bed Supine to Sit: 4: Min assist Sitting - Scoot to [place] of Bed: 4: Min assist Details for Bed Mobility Assistance: min assist for RLE Transfers Transfers: Sit to Stand;Stand to Sit Sit to Stand: 1: +2 Total assist;3: Mod assist;From bed Stand to Sit: 4: Min assist;To chair/3-in-1;With armrests;With upper extremity assist Details for Transfer Assistance: VC's for hand placement Ambulation/Gait Ambulation/Gait Assistance: 4: Min assist Ambulation Distance (Feet): 7 Feet Assistive device: Rolling walker Ambulation/Gait Assistance Details: VCs for sequencing Gait Pattern: Step-to pattern;Decreased stride length;Decreased weight shift to right;Antalgic Gait velocity: decreased    Exercises     PT Diagnosis: Difficulty walking;Abnormality of gait;Generalized weakness;Acute pain  PT Problem List: Decreased strength;Decreased range of motion;Decreased activity tolerance;Decreased  mobility;Decreased knowledge of use of DME;Pain PT Treatment Interventions: DME instruction;Gait training;Stair training;Functional mobility training;Therapeutic activities;Therapeutic exercise;Patient/family education   PT Goals Acute Rehab PT Goals PT Goal Formulation: With patient Time For Goal Achievement: 10/11/11 Potential to Achieve Goals: Good Pt will go Supine/Side to Sit: with modified independence PT Goal: Supine/Side to Sit - Progress: Goal set today Pt will go Sit to Stand: with modified independence PT Goal: Sit to Stand - Progress: Goal set today Pt will Ambulate: >150 feet;with modified independence;with rolling walker PT Goal: Ambulate - Progress: Goal set today Pt will Go Up / Down Stairs: 1-2 stairs;with min assist;with rolling walker PT Goal: Up/Down Stairs - Progress: Goal set today Pt will Perform Home Exercise Program: Independently PT Goal: Perform Home Exercise Program - Progress: Goal set today  Visit Information  Last PT Received On: 10/06/11 Assistance Needed: +2    Subjective Data  Subjective: Pt agreeable for PT  Patient Stated Goal: to walk   Prior Functioning  Home Living Lives With: Son Available Help at Discharge: Family Type of Home: House Home Access: Other (comment);Ramped entrance;Stairs to enter (threshold) Entrance Stairs-Number of Steps: 2 Home Layout: One level Bathroom Shower/Tub: Health visitor: Handicapped height Home Adaptive Equipment: Walker - rolling Prior Function Level of Independence: Independent Able to Take Stairs?: Yes Driving: Yes Vocation: Unemployed Communication Communication: No difficulties    Cognition  Overall Cognitive Status: Appears within functional limits for tasks assessed/performed Arousal/Alertness: Awake/alert Orientation Level: Oriented X4 / Intact;Appears intact for tasks assessed Behavior During Session: [facility] for tasks performed    Extremity/Trunk Assessment Right Upper  Extremity Assessment RUE ROM/Strength/Tone: [facility] for tasks assessed Left Upper Extremity Assessment LUE ROM/Strength/Tone: [facility] for tasks assessed Right Lower Extremity Assessment RLE ROM/Strength/Tone: Deficits;Unable to fully  assess;Due to pain;Due to precautions RLE ROM/Strength/Tone Deficits: 10-60 degrees Left Lower Extremity Assessment LLE ROM/Strength/Tone: WFL for tasks assessed   Balance    End of Session PT - End of Session Equipment Utilized During Treatment: Gait belt Activity Tolerance: Patient tolerated treatment well;Patient limited by pain Patient left: in chair;with call bell/phone within reach Nurse Communication: Mobility status CPM Right Knee CPM Right Knee: Off  GP     Fabio Asa 10/06/2011, 1:11 PM Charlotte Crumb, PT DPT  [phone]

## 2011-10-07 DIAGNOSIS — D62 Acute posthemorrhagic anemia: Secondary | ICD-10-CM | POA: Diagnosis not present

## 2011-10-07 LAB — CBC
HCT: 26.8 % — ABNORMAL LOW (ref 36.0–46.0)
Hemoglobin: 8.8 g/dL — ABNORMAL LOW (ref 12.0–15.0)
MCH: 30.8 pg (ref 26.0–34.0)
MCHC: 32.8 g/dL (ref 30.0–36.0)
MCV: 93.7 fL (ref 78.0–100.0)
Platelets: 137 10*3/uL — ABNORMAL LOW (ref 150–400)
RBC: 2.86 MIL/uL — ABNORMAL LOW (ref 3.87–5.11)
RDW: 13.2 % (ref 11.5–15.5)
WBC: 8 10*3/uL (ref 4.0–10.5)

## 2011-10-07 MED ORDER — ASPIRIN 325 MG PO TABS: 325.0000 mg | ORAL_TABLET | Freq: Two times a day (BID) | ORAL | Status: DC

## 2011-10-07 MED ORDER — HYDROCODONE-ACETAMINOPHEN 10-325 MG PO TABS: 1.0000 | ORAL_TABLET | ORAL | Status: DC | PRN

## 2011-10-07 NOTE — Progress Notes (Signed)
Bed search for SNF bed in place; anticipate vacancies tomorrow.  Fl2 on chart for MD's signature. Lorri Frederick. [place]  [phone]

## 2011-10-07 NOTE — Progress Notes (Signed)
Physical Therapy Treatment Patient Details Name: [name] MRN: [id number] DOB: 06-17-1945 Today's Date: 10/07/2011 Time: 7829-5621 PT Time Calculation (min): 24 min  PT Assessment / Plan / Recommendation Comments on Treatment Session  Pt continues to make steady progress towards PT goals at this time. Patient was able to increase activity tolerance with ambulation and required less assist with functional mobility.    Follow Up Recommendations  Skilled nursing facility    Barriers to Discharge        Equipment Recommendations  3 in 1 bedside comode    Recommendations for Other Services    Frequency 7X/week   Plan Discharge plan remains appropriate    Precautions / Restrictions Precautions Precautions: Knee Restrictions Weight Bearing Restrictions: Yes RLE Weight Bearing: Weight bearing as tolerated   Pertinent Vitals/Pain 5/10    Mobility  Bed Mobility Bed Mobility: Supine to Sit;Sitting - Scoot to Edge of Bed Supine to Sit: 4: Min guard Sitting - Scoot to [place] of Bed: 4: Min guard Transfers Transfers: Sit to Stand;Stand to Sit Sit to Stand: With upper extremity assist;From bed;4: Min guard Stand to Sit: 4: Min guard;To chair/3-in-1;With armrests Details for Transfer Assistance: Transfers include bed/chair/ and on/off commode; VCs for controlled movement Ambulation/Gait Ambulation/Gait Assistance: 4: Min guard Ambulation Distance (Feet): 100 Feet Assistive device: Rolling walker Ambulation/Gait Assistance Details: VCs for upright posture Gait Pattern: Step-to pattern;Decreased stride length;Decreased weight shift to right;Antalgic Gait velocity: decreased      PT Goals Acute Rehab PT Goals PT Goal: Supine/Side to Sit - Progress: Progressing toward goal PT Goal: Sit to Stand - Progress: Progressing toward goal PT Goal: Ambulate - Progress: Progressing toward goal  Visit Information  Last PT Received On: 10/07/11 Assistance Needed: +1    Subjective Data  Subjective: I have to use the bathroom Patient Stated Goal: to walk   Cognition  Overall Cognitive Status: Appears within functional limits for tasks assessed/performed Arousal/Alertness: Awake/alert Orientation Level: Oriented X4 / Intact;Appears intact for tasks assessed Behavior During Session: [facility] for tasks performed    Balance     End of Session PT - End of Session Equipment Utilized During Treatment: Gait belt Activity Tolerance: Patient tolerated treatment well;Patient limited by pain Patient left: in chair;with call bell/phone within reach;with family/visitor present Nurse Communication: Mobility status   GP     Fabio Asa 10/07/2011, 9:13 AM Charlotte Crumb, PT DPT  [phone]

## 2011-10-07 NOTE — Progress Notes (Signed)
Hemoglobin decreased 8.8  diagnoses acute blood loss anemia asymptomatic Patient has participated in physical therapy without any difficulties. Plan for discharge to skilled nursing facility Friday.

## 2011-10-07 NOTE — Progress Notes (Signed)
Physical Therapy Treatment Patient Details Name: MAGDALENA SKILTON MRN: [id number] DOB: [birth year] Today's Date: 10/07/2011 Time: 1700-1715 PT Time Calculation (min): 15 min  PT Assessment / Plan / Recommendation Comments on Treatment Session  Upon entering room, pt had just gotten back into bed with nurse, therefore therex completed on bed. Pt with good quad strength during all exercises. Pt up to 80 degrees flexion with PROM, 75 degrees AROM.     Follow Up Recommendations  Skilled nursing facility    Barriers to Discharge        Equipment Recommendations  3 in 1 bedside comode    Recommendations for Other Services    Frequency 7X/week   Plan Discharge plan remains appropriate    Precautions / Restrictions Precautions Precautions: Knee Restrictions Weight Bearing Restrictions: Yes RLE Weight Bearing: Weight bearing as tolerated   Pertinent Vitals/Pain Pt with minimal complaints of pain 4/10 at end range extension.     Mobility  Bed Mobility Bed Mobility: Supine to Sit;Sit to Supine Supine to Sit: 5: Supervision Sitting - Scoot to Edge of Bed: 5: Supervision Details for Bed Mobility Assistance: Pt able to bring both legs off of bed without any assistance, good quad control.  Transfers Transfers: Not assessed    Exercises Total Joint Exercises Quad Sets: AROM;Strengthening;Right;10 reps;Supine Heel Slides: AAROM;Strengthening;Right;10 reps;Seated Hip ABduction/ADduction: AROM;Strengthening;Right;10 reps;Supine Straight Leg Raises: AROM;Strengthening;Right;10 reps;Supine Long Arc Quad: AROM;Strengthening;Right;10 reps;Seated   PT Diagnosis:    PT Problem List:   PT Treatment Interventions:     PT Goals Acute Rehab PT Goals PT Goal: Perform Home Exercise Program - Progress: Progressing toward goal  Visit Information  Last PT Received On: 10/07/11 Assistance Needed: +1    Subjective Data      Cognition  Overall Cognitive Status: Appears within functional limits  for tasks assessed/performed Arousal/Alertness: Awake/alert Orientation Level: Oriented X4 / Intact;Appears intact for tasks assessed Behavior During Session: [facility] for tasks performed    Balance     End of Session PT - End of Session Equipment Utilized During Treatment: Gait belt Activity Tolerance: Patient tolerated treatment well Patient left: in bed;with call bell/phone within reach Nurse Communication: Mobility status   GP     Milana Kidney 10/07/2011, 5:32 PM

## 2011-10-07 NOTE — Progress Notes (Signed)
Subjective: 2 Days Post-Op Procedure(s) (LRB): TOTAL KNEE ARTHROPLASTY (Right)  Activity level:  Has been out of bed with physical therapy Diet tolerance:  Eating well Voiding:  Voiding okay Patient reports pain as 3 on 0-10 scale.    Objective: Vital signs in last 24 hours: Temp:  [99.2 F (37.3 C)-100.1 F (37.8 C)] 99.2 F (37.3 C) (08/29 0525) Pulse Rate:  [90] 90  (08/29 1042) Resp:  [18] 18  (08/29 0525) BP: (100-120)/(51-64) 100/64 mmHg (08/29 1042) SpO2:  [96 %] 96 % (08/29 0525)  Labs:  Basename 10/07/11 0540 10/06/11 0600  HGB 8.8* 10.2*    Basename 10/07/11 0540 10/06/11 0600  WBC 8.0 9.5  RBC 2.86* 3.27*  HCT 26.8* 30.6*  PLT 137* 173    Basename 10/06/11 0600  NA 141  K 4.1  CL 110  CO2 24  BUN 17  CREATININE 0.85  GLUCOSE 112*  CALCIUM 8.5   No results found for this basename: LABPT:2,INR:2 in the last 72 hours  Physical Exam:  Neurologically intact ABD soft Neurovascular intact Sensation intact distally Intact pulses distally Dorsiflexion/Plantar flexion intact No cellulitis present Compartment soft Dressing changed and wound noted to be benign.  Assessment/Plan:  2 Days Post-Op Procedure(s) (LRB): TOTAL KNEE ARTHROPLASTY (Right) Advance diet Up with therapy D/C IV fluids Discharge to SNF most likely on Friday.    [name] 10/07/2011, 1:42 PM

## 2011-10-08 DIAGNOSIS — K219 Gastro-esophageal reflux disease without esophagitis: Secondary | ICD-10-CM | POA: Diagnosis not present

## 2011-10-08 DIAGNOSIS — E785 Hyperlipidemia, unspecified: Secondary | ICD-10-CM | POA: Diagnosis not present

## 2011-10-08 DIAGNOSIS — Z5189 Encounter for other specified aftercare: Secondary | ICD-10-CM | POA: Diagnosis not present

## 2011-10-08 DIAGNOSIS — M1990 Unspecified osteoarthritis, unspecified site: Secondary | ICD-10-CM | POA: Diagnosis not present

## 2011-10-08 DIAGNOSIS — G2581 Restless legs syndrome: Secondary | ICD-10-CM | POA: Diagnosis not present

## 2011-10-08 DIAGNOSIS — I10 Essential (primary) hypertension: Secondary | ICD-10-CM | POA: Diagnosis not present

## 2011-10-08 DIAGNOSIS — Z471 Aftercare following joint replacement surgery: Secondary | ICD-10-CM | POA: Diagnosis not present

## 2011-10-08 DIAGNOSIS — R279 Unspecified lack of coordination: Secondary | ICD-10-CM | POA: Diagnosis not present

## 2011-10-08 DIAGNOSIS — Z96659 Presence of unspecified artificial knee joint: Secondary | ICD-10-CM | POA: Diagnosis not present

## 2011-10-08 DIAGNOSIS — R262 Difficulty in walking, not elsewhere classified: Secondary | ICD-10-CM | POA: Diagnosis not present

## 2011-10-08 DIAGNOSIS — Z4801 Encounter for change or removal of surgical wound dressing: Secondary | ICD-10-CM | POA: Diagnosis not present

## 2011-10-08 DIAGNOSIS — IMO0002 Reserved for concepts with insufficient information to code with codable children: Secondary | ICD-10-CM | POA: Diagnosis not present

## 2011-10-08 DIAGNOSIS — M25569 Pain in unspecified knee: Secondary | ICD-10-CM | POA: Diagnosis not present

## 2011-10-08 DIAGNOSIS — G43919 Migraine, unspecified, intractable, without status migrainosus: Secondary | ICD-10-CM | POA: Diagnosis not present

## 2011-10-08 DIAGNOSIS — M1710 Unilateral primary osteoarthritis, unspecified knee: Secondary | ICD-10-CM | POA: Diagnosis not present

## 2011-10-08 DIAGNOSIS — K2270 Barrett's esophagus without dysplasia: Secondary | ICD-10-CM | POA: Diagnosis not present

## 2011-10-08 LAB — CBC
HCT: 25.2 % — ABNORMAL LOW (ref 36.0–46.0)
Hemoglobin: 8.3 g/dL — ABNORMAL LOW (ref 12.0–15.0)
MCH: 31 pg (ref 26.0–34.0)
MCHC: 32.9 g/dL (ref 30.0–36.0)
MCV: 94 fL (ref 78.0–100.0)
Platelets: 136 10*3/uL — ABNORMAL LOW (ref 150–400)
RBC: 2.68 MIL/uL — ABNORMAL LOW (ref 3.87–5.11)
RDW: 13.1 % (ref 11.5–15.5)
WBC: 8.5 10*3/uL (ref 4.0–10.5)

## 2011-10-08 NOTE — Progress Notes (Signed)
Pt and sister given discharge instructions via the teach back method and both verbalize understanding of instructions.  Pt has no complaints of pain and vital signs are within normal limits. Pt escorted out of building via stretcher to ambulance for transport to facility

## 2011-10-08 NOTE — Clinical Social Work Placement (Addendum)
    Clinical Social Work Department CLINICAL SOCIAL WORK PLACEMENT NOTE 10/08/2011  Patient:  [name], [name]  Account Number:  [id number] Admit date:  10/05/2011  Clinical Social Worker:  Lupita Leash Felder Lebeda, BSW  Date/time:  10/07/2011 05:15 PM  Clinical Social Work is seeking post-discharge placement for this patient at the following level of care:   SKILLED NURSING   (*CSW will update this form in Epic as items are completed)   10/07/2011  Patient/family provided with [facility] Department of Clinical Social Work's list of facilities offering this level of care within the geographic area requested by the patient (or if unable, by the patient's family).  10/07/2011  Patient/family informed of their freedom to choose among providers that offer the needed level of care, that participate in Medicare, Medicaid or managed care program needed by the patient, have an available bed and are willing to accept the patient.  10/07/2011  Patient/family informed of [facility]' ownership interest in [facility], as well as of the fact that they are under no obligation to receive care at this facility.  PASARR submitted to EDS on 10/07/2011 PASARR number received from EDS on 10/08/2011  FL2 transmitted to all facilities in geographic area requested by pt/family on  10/07/2011 FL2 transmitted to all facilities within larger geographic area on   Patient informed that his/her managed care company has contracts with or will negotiate with  certain facilities, including the following:   NA     Patient/family informed of bed offers received:  10/08/11 Patient chooses bed at [facility] Physician recommends and patient chooses bed at    Patient to be transferred to [facility] on  10/08/11 Patient to be transferred to facility by Ambulance  Sharin Mons)  The following physician request were entered in Epic:   Additional Comments: Bed offers given and patient accepted available bed at [facility].  DC  today via EMS. Patient and son are pleased with d/c arrangement. RN notified and will call report.  No further CSW intervention indicated.  Lorri Frederick. [place]  [phone]

## 2011-10-08 NOTE — Progress Notes (Signed)
Subjective: 3 Days Post-Op Procedure(s) (LRB): TOTAL KNEE ARTHROPLASTY (Right) ABLA Activity level:  No problem oob Diet tolerance:  ok Voiding:  ok Patient reports pain as 2 on 0-10 scale.    Objective: Vital signs in last 24 hours: Temp:  [98.6 F (37 C)-100.3 F (37.9 C)] 98.6 F (37 C) (08/30 0510) Pulse Rate:  [86-93] 90  (08/30 0510) Resp:  [16-18] 18  (08/30 0510) BP: (100-131)/(58-79) 130/79 mmHg (08/30 0510) SpO2:  [91 %-100 %] 91 % (08/30 0510)  Labs:  Basename 10/08/11 0635 10/07/11 0540 10/06/11 0600  HGB 8.3* 8.8* 10.2*    Basename 10/08/11 0635 10/07/11 0540  WBC 8.5 8.0  RBC 2.68* 2.86*  HCT 25.2* 26.8*  PLT 136* 137*    Basename 10/06/11 0600  NA 141  K 4.1  CL 110  CO2 24  BUN 17  CREATININE 0.85  GLUCOSE 112*  CALCIUM 8.5   No results found for this basename: LABPT:2,INR:2 in the last 72 hours  Physical Exam:  Neurologically intact ABD soft Neurovascular intact No cellulitis present Compartment soft  Assessment/Plan:  3 Days Post-Op Procedure(s) (LRB): TOTAL KNEE ARTHROPLASTY (Right) Advance diet Up with therapy Discharge to SNF    Linder Prajapati R 10/08/2011, 8:08 AM

## 2011-10-08 NOTE — Clinical Social Work Psychosocial (Addendum)
    Clinical Social Work Department BRIEF PSYCHOSOCIAL ASSESSMENT 10/08/2011  Patient:  [name], [name]     Account Number:  [id number]     Admit date:  10/05/2011  Clinical Social Worker:  Burnard Hawthorne  Date/Time:  10/07/2011 05:00 PM  Referred by:  Physician  Date Referred:  10/07/2011 Referred for  SNF Placement   Other Referral:   Interview type:  Patient Other interview type:    PSYCHOSOCIAL DATA Living Status:  WITH ADULT CHILDREN Admitted from facility:   Level of care:   Primary support name:  Eldora Napp  (son) Primary support relationship to patient:  CHILD, ADULT Degree of support available:   Strong support    CURRENT CONCERNS Current Concerns  Post-Acute Placement   Other Concerns:    SOCIAL WORK ASSESSMENT / PLAN Met with patient- she had hoped to return home and states that she lives with her son. He works during the day and is unable to be with her. There is no other family to stay with her. Discussed recommendation for short term SNF and patient agrees to short term SNF.  FL2 initated and will be forwarded to SNFs for review.   Assessment/plan status:  Psychosocial Support/Ongoing Assessment of Needs Other assessment/ plan:   Information/referral to community resources:   SNF list provided  Discussed possible after care needs (HH/DME) to be arranged by SNF if indicated at d/c    PATIENT'S/FAMILY'S RESPONSE TO PLAN OF CARE: Patient is alert, oriented and very pleasant. She agrees to short term SNF and states that her son will support this.

## 2011-10-08 NOTE — Plan of Care (Signed)
Problem: Discharge Progression Outcomes Goal: Barriers To Progression Addressed/Resolved Outcome: Progressing Pt discharging to nursing home at [facility] for rehab.

## 2011-10-08 NOTE — Discharge Summary (Signed)
Patient ID: [name] MRN: [id number] DOB/AGE: [id number] y.o.  Admit date: 10/05/2011 Discharge date: 10/08/2011  Admission Diagnoses:  Active Problems:  Acute blood loss anemia  DJD (degenerative joint disease)   Discharge Diagnoses:  Same  Past Medical History  Diagnosis Date  . Hyperlipidemia   . Unspecified essential hypertension   . Osteoporosis   . Hiatal hernia   . Depressive disorder, not elsewhere classified   . Esophageal reflux   . Back pain   . Barrett's esophagus   . RLS (restless legs syndrome)   . Complication of anesthesia   . PONV (postoperative nausea and vomiting)   . Family history of anesthesia complication     Mother had severe N/V  . Shortness of breath     "related to high blood pressure"  . Migraine   . Arthritis     bilateral hands; knees  . Chronic lower back pain     Surgeries: Procedure(s): TOTAL KNEE ARTHROPLASTY on 10/05/2011   Consultants:    Discharged Condition: Improved  Hospital Course: [name] is an 66 y.o. female who was admitted 10/05/2011 for operative treatment of<principal problem not specified>. Patient has severe unremitting pain that affects sleep, daily activities, and work/hobbies. After pre-op clearance the patient was taken to the operating room on 10/05/2011 and underwent  Procedure(s): TOTAL KNEE ARTHROPLASTY.   No symptoms of abla , no blood given Patient was given perioperative antibiotics: Anti-infectives     Start     Dose/Rate Route Frequency Ordered Stop   10/05/11 2200   vancomycin (VANCOCIN) IVPB 1000 mg/200 mL premix        1,000 mg 200 mL/hr over 60 Minutes Intravenous Every 12 hours 10/05/11 1441 10/06/11 0101   10/05/11 1039   cefUROXime (ZINACEF) injection  Status:  Discontinued          As needed 10/05/11 1039 10/05/11 1200   10/04/11 1439   vancomycin (VANCOCIN) 1,500 mg in sodium chloride 0.9 % 500 mL IVPB        1,500 mg 250 mL/hr over 120 Minutes Intravenous 120 min pre-op  10/04/11 1439 10/05/11 0932           Patient was given sequential compression devices, early ambulation, and chemoprophylaxis to prevent DVT.Will continue ASA 325 BID for 2 weeks to prevent DVT  Patient benefited maximally from hospital stay and there were no complications.    Recent vital signs: Patient Vitals for the past 24 hrs:  BP Temp Pulse Resp SpO2  10/08/11 0510 130/79 mmHg 98.6 F (37 C) 90  18  91 %  10/13/11 2300 - 100.3 F (37.9 C) - - -  10-13-2011 2130 131/58 mmHg 100.3 F (37.9 C) 93  16  99 %  10-13-2011 1400 121/64 mmHg 99.5 F (37.5 C) 86  16  100 %  10-13-11 1042 100/64 mmHg - 90  - -     Recent laboratory studies:  Basename 10/08/11 0635 October 13, 2011 0540 10/06/11 0600  WBC 8.5 8.0 --  HGB 8.3* 8.8* --  HCT 25.2* 26.8* --  PLT 136* 137* --  NA -- -- 141  K -- -- 4.1  CL -- -- 110  CO2 -- -- 24  BUN -- -- 17  CREATININE -- -- 0.85  GLUCOSE -- -- 112*  INR -- -- --  CALCIUM -- -- 8.5     Discharge Medications:   Medication List  As of 10/08/2011  8:10 AM   STOP taking these medications  celecoxib 200 MG capsule         TAKE these medications         aspirin 325 MG tablet   Take 1 tablet (325 mg total) by mouth 2 (two) times daily.      atenolol 25 MG tablet   Commonly known as: TENORMIN   Take 1 tablet (25 mg total) by mouth daily.      benazepril 10 MG tablet   Commonly known as: LOTENSIN      cyclobenzaprine 10 MG tablet   Commonly known as: FLEXERIL   Take 10 mg by mouth 3 (three) times daily as needed. For migraines.      HYDROcodone-acetaminophen 10-325 MG per tablet   Commonly known as: NORCO   Take 1 tablet by mouth every 4 (four) hours as needed for pain. For back pain      mupirocin ointment 2 %   Commonly known as: BACTROBAN   Place into the nose 2 (two) times daily. Patient needs PM dose tonight to complete 10 dose treatment for positive PCR      omeprazole 40 MG capsule   Commonly known as: PRILOSEC   Take 1  capsule (40 mg total) by mouth 2 (two) times daily.      pravastatin 40 MG tablet   Commonly known as: PRAVACHOL   Take 40 mg by mouth daily.      ranitidine 300 MG tablet   Commonly known as: ZANTAC   Take 300 mg by mouth at bedtime.      rOPINIRole 1 MG tablet   Commonly known as: REQUIP   Take 0.5-1 mg by mouth 2 (two) times daily. Take 1/2  tab with dinner  and 1 tab at bedtime.      SYSTANE OP   Place 2 drops into both eyes daily as needed. For dry or burning eyes      topiramate 200 MG tablet   Commonly known as: TOPAMAX   Take 400 mg by mouth at bedtime. Take 2 by mouth at bedtime      traZODone 50 MG tablet   Commonly known as: DESYREL   Take 50 mg by mouth at bedtime. Take 1 to 2 tabs by mouth at bedtime            Diagnostic Studies: Dg Chest 2 View  09/30/2011  *RADIOLOGY REPORT*  Clinical Data: Preoperative respiratory exam; hiatal hernia, total knee arthroplasty  CHEST - 2 VIEW  Comparison: January 07, 2010  Findings: The cardiac silhouette, mediastinum, pulmonary vasculature are within normal limits.  Both lungs are clear. There is no acute bony abnormality.  Resorption of the distal right clavicle is again noted.  IMPRESSION: There is no evidence of acute cardiac or pulmonary process.   Original Report Authenticated By: Brandon Melnick, M.D.     Disposition:   Discharge Orders    Future Appointments: Provider: Department: Dept Phone: Center:   12/15/2011 1:45 PM Wanda Plump, MD [facility] [phone] [facility]   02/22/2012 11:30 AM Barbaraann Share, MD [facility] [phone] None     Future Orders Please Complete By Expires   Diet - low sodium heart healthy      Call MD / Call 911      Comments:   If you experience chest pain or shortness of breath, CALL 911 and be transported to the hospital emergency room.  If you develope a fever above 101 F, pus (white drainage) or increased drainage or redness at  the wound, or calf pain, call your  surgeon's office.   Constipation Prevention      Comments:   Drink plenty of fluids.  Prune juice may be helpful.  You may use a stool softener, such as Colace (over the counter) 100 mg twice a day.  Use MiraLax (over the counter) for constipation as needed.   Increase activity slowly as tolerated       D/C to SNF  [facility]  Follow-up Information    Follow up with Velna Ochs, MD in 2 weeks.   Contact information:   [address] [place] [address] [phone]           Signed: Prince Rome 10/08/2011, 8:10 AM

## 2011-10-08 NOTE — Progress Notes (Signed)
Physical Therapy Treatment Patient Details Name: [name] MRN: [id number] DOB: Jan 24, 1946 Today's Date: 10/08/2011 Time: 0902-0926 PT Time Calculation (min): 24 min  PT Assessment / Plan / Recommendation Comments on Treatment Session  Pt educated on precautions and ROM for right knee. Pt tolerated ther ex program well; will progress to seated/standing ther ex next treatment session.    Follow Up Recommendations  Skilled nursing facility    Barriers to Discharge        Equipment Recommendations  3 in 1 bedside comode    Recommendations for Other Services    Frequency 7X/week   Plan Discharge plan remains appropriate    Precautions / Restrictions Precautions Precautions: Knee Restrictions Weight Bearing Restrictions: Yes RLE Weight Bearing: Weight bearing as tolerated   Pertinent Vitals/Pain 5/10    Mobility       Exercises Total Joint Exercises Ankle Circles/Pumps: AROM;10 reps;Supine Quad Sets: AROM;Strengthening;Right;10 reps;Supine Towel Squeeze: AROM;Strengthening;Right;10 reps;Supine Short Arc Quad: AROM;Strengthening;Right;10 reps;Supine Heel Slides: AAROM;Right;10 reps Hip ABduction/ADduction: AROM;Strengthening;Right;10 reps;Supine Straight Leg Raises: AROM;Strengthening;Right;10 reps;Supine     PT Goals Acute Rehab PT Goals Pt will Perform Home Exercise Program: Independently PT Goal: Perform Home Exercise Program - Progress: Progressing toward goal  Visit Information  Last PT Received On: 10/08/11 Assistance Needed: +1    Subjective Data  Subjective: I feel like i am able to move better on my own Patient Stated Goal: to walk   Cognition  Overall Cognitive Status: Appears within functional limits for tasks assessed/performed Arousal/Alertness: Awake/alert Orientation Level: Oriented X4 / Intact;Appears intact for tasks assessed Behavior During Session: [facility] for tasks performed    Balance     End of Session PT - End of Session Activity  Tolerance: Patient tolerated treatment well;Patient limited by pain Patient left: in bed;with call bell/phone within reach Nurse Communication: Mobility status;Patient requests pain meds   GP     Fabio Asa 10/08/2011, 9:28 AM Charlotte Crumb, PT DPT  [phone]

## 2011-10-17 DIAGNOSIS — M1710 Unilateral primary osteoarthritis, unspecified knee: Secondary | ICD-10-CM | POA: Diagnosis not present

## 2011-10-17 DIAGNOSIS — IMO0002 Reserved for concepts with insufficient information to code with codable children: Secondary | ICD-10-CM | POA: Diagnosis not present

## 2011-10-17 DIAGNOSIS — K219 Gastro-esophageal reflux disease without esophagitis: Secondary | ICD-10-CM | POA: Diagnosis not present

## 2011-10-18 DIAGNOSIS — K2270 Barrett's esophagus without dysplasia: Secondary | ICD-10-CM | POA: Diagnosis not present

## 2011-10-18 DIAGNOSIS — E785 Hyperlipidemia, unspecified: Secondary | ICD-10-CM | POA: Diagnosis not present

## 2011-10-18 DIAGNOSIS — M1710 Unilateral primary osteoarthritis, unspecified knee: Secondary | ICD-10-CM | POA: Diagnosis not present

## 2011-10-18 DIAGNOSIS — G2581 Restless legs syndrome: Secondary | ICD-10-CM | POA: Diagnosis not present

## 2011-10-18 DIAGNOSIS — G43919 Migraine, unspecified, intractable, without status migrainosus: Secondary | ICD-10-CM | POA: Diagnosis not present

## 2011-10-18 DIAGNOSIS — Z471 Aftercare following joint replacement surgery: Secondary | ICD-10-CM | POA: Diagnosis not present

## 2011-10-18 DIAGNOSIS — Z96659 Presence of unspecified artificial knee joint: Secondary | ICD-10-CM | POA: Diagnosis not present

## 2011-10-19 ENCOUNTER — Telehealth: Payer: Self-pay | Admitting: Internal Medicine

## 2011-10-19 NOTE — Telephone Encounter (Signed)
Patient called regarding her upcoming appt on 9/24. She wants to know if this appt is necessary. She cannot drive and her son is having to take off work to bring her to her appts. She states she is feeling good. I suggested to possibly move up her 6 mo follow-up visit to from 11/6 to mid-October as an option. Please advise if pt needs to keep this appt.

## 2011-10-19 NOTE — Telephone Encounter (Signed)
Please advise 

## 2011-10-19 NOTE — Telephone Encounter (Signed)
Okay to postpone the appointment until October or November, as long as she feels well.

## 2011-10-21 DIAGNOSIS — F3289 Other specified depressive episodes: Secondary | ICD-10-CM | POA: Diagnosis not present

## 2011-10-21 DIAGNOSIS — I10 Essential (primary) hypertension: Secondary | ICD-10-CM | POA: Diagnosis not present

## 2011-10-21 DIAGNOSIS — Z471 Aftercare following joint replacement surgery: Secondary | ICD-10-CM | POA: Diagnosis not present

## 2011-10-21 DIAGNOSIS — F329 Major depressive disorder, single episode, unspecified: Secondary | ICD-10-CM | POA: Diagnosis not present

## 2011-10-21 DIAGNOSIS — R262 Difficulty in walking, not elsewhere classified: Secondary | ICD-10-CM | POA: Diagnosis not present

## 2011-10-21 DIAGNOSIS — M6281 Muscle weakness (generalized): Secondary | ICD-10-CM | POA: Diagnosis not present

## 2011-10-21 DIAGNOSIS — Z96659 Presence of unspecified artificial knee joint: Secondary | ICD-10-CM | POA: Diagnosis not present

## 2011-10-22 DIAGNOSIS — F3289 Other specified depressive episodes: Secondary | ICD-10-CM | POA: Diagnosis not present

## 2011-10-22 DIAGNOSIS — R262 Difficulty in walking, not elsewhere classified: Secondary | ICD-10-CM | POA: Diagnosis not present

## 2011-10-22 DIAGNOSIS — I10 Essential (primary) hypertension: Secondary | ICD-10-CM | POA: Diagnosis not present

## 2011-10-22 DIAGNOSIS — Z471 Aftercare following joint replacement surgery: Secondary | ICD-10-CM | POA: Diagnosis not present

## 2011-10-22 DIAGNOSIS — M6281 Muscle weakness (generalized): Secondary | ICD-10-CM | POA: Diagnosis not present

## 2011-10-22 DIAGNOSIS — F329 Major depressive disorder, single episode, unspecified: Secondary | ICD-10-CM | POA: Diagnosis not present

## 2011-10-23 DIAGNOSIS — F329 Major depressive disorder, single episode, unspecified: Secondary | ICD-10-CM | POA: Diagnosis not present

## 2011-10-23 DIAGNOSIS — I10 Essential (primary) hypertension: Secondary | ICD-10-CM | POA: Diagnosis not present

## 2011-10-23 DIAGNOSIS — M6281 Muscle weakness (generalized): Secondary | ICD-10-CM | POA: Diagnosis not present

## 2011-10-23 DIAGNOSIS — Z471 Aftercare following joint replacement surgery: Secondary | ICD-10-CM | POA: Diagnosis not present

## 2011-10-23 DIAGNOSIS — R262 Difficulty in walking, not elsewhere classified: Secondary | ICD-10-CM | POA: Diagnosis not present

## 2011-10-23 DIAGNOSIS — F3289 Other specified depressive episodes: Secondary | ICD-10-CM | POA: Diagnosis not present

## 2011-10-25 DIAGNOSIS — R262 Difficulty in walking, not elsewhere classified: Secondary | ICD-10-CM | POA: Diagnosis not present

## 2011-10-25 DIAGNOSIS — F3289 Other specified depressive episodes: Secondary | ICD-10-CM | POA: Diagnosis not present

## 2011-10-25 DIAGNOSIS — M6281 Muscle weakness (generalized): Secondary | ICD-10-CM | POA: Diagnosis not present

## 2011-10-25 DIAGNOSIS — F329 Major depressive disorder, single episode, unspecified: Secondary | ICD-10-CM | POA: Diagnosis not present

## 2011-10-25 DIAGNOSIS — I10 Essential (primary) hypertension: Secondary | ICD-10-CM | POA: Diagnosis not present

## 2011-10-25 DIAGNOSIS — Z471 Aftercare following joint replacement surgery: Secondary | ICD-10-CM | POA: Diagnosis not present

## 2011-10-26 NOTE — Telephone Encounter (Signed)
lmovm

## 2011-10-26 NOTE — Telephone Encounter (Signed)
Pt returned call. Moved 6 mo fu appt to mid-October. Pt states she is feeling fine.

## 2011-10-27 DIAGNOSIS — F3289 Other specified depressive episodes: Secondary | ICD-10-CM | POA: Diagnosis not present

## 2011-10-27 DIAGNOSIS — Z471 Aftercare following joint replacement surgery: Secondary | ICD-10-CM | POA: Diagnosis not present

## 2011-10-27 DIAGNOSIS — F329 Major depressive disorder, single episode, unspecified: Secondary | ICD-10-CM | POA: Diagnosis not present

## 2011-10-27 DIAGNOSIS — M6281 Muscle weakness (generalized): Secondary | ICD-10-CM | POA: Diagnosis not present

## 2011-10-27 DIAGNOSIS — R262 Difficulty in walking, not elsewhere classified: Secondary | ICD-10-CM | POA: Diagnosis not present

## 2011-10-27 DIAGNOSIS — I10 Essential (primary) hypertension: Secondary | ICD-10-CM | POA: Diagnosis not present

## 2011-10-28 DIAGNOSIS — F329 Major depressive disorder, single episode, unspecified: Secondary | ICD-10-CM | POA: Diagnosis not present

## 2011-10-28 DIAGNOSIS — M6281 Muscle weakness (generalized): Secondary | ICD-10-CM | POA: Diagnosis not present

## 2011-10-28 DIAGNOSIS — I10 Essential (primary) hypertension: Secondary | ICD-10-CM | POA: Diagnosis not present

## 2011-10-28 DIAGNOSIS — F3289 Other specified depressive episodes: Secondary | ICD-10-CM | POA: Diagnosis not present

## 2011-10-28 DIAGNOSIS — R262 Difficulty in walking, not elsewhere classified: Secondary | ICD-10-CM | POA: Diagnosis not present

## 2011-10-28 DIAGNOSIS — Z471 Aftercare following joint replacement surgery: Secondary | ICD-10-CM | POA: Diagnosis not present

## 2011-10-29 DIAGNOSIS — F329 Major depressive disorder, single episode, unspecified: Secondary | ICD-10-CM | POA: Diagnosis not present

## 2011-10-29 DIAGNOSIS — I10 Essential (primary) hypertension: Secondary | ICD-10-CM | POA: Diagnosis not present

## 2011-10-29 DIAGNOSIS — M6281 Muscle weakness (generalized): Secondary | ICD-10-CM | POA: Diagnosis not present

## 2011-10-29 DIAGNOSIS — Z471 Aftercare following joint replacement surgery: Secondary | ICD-10-CM | POA: Diagnosis not present

## 2011-10-29 DIAGNOSIS — F3289 Other specified depressive episodes: Secondary | ICD-10-CM | POA: Diagnosis not present

## 2011-10-29 DIAGNOSIS — M1710 Unilateral primary osteoarthritis, unspecified knee: Secondary | ICD-10-CM | POA: Diagnosis not present

## 2011-10-29 DIAGNOSIS — R262 Difficulty in walking, not elsewhere classified: Secondary | ICD-10-CM | POA: Diagnosis not present

## 2011-11-02 ENCOUNTER — Ambulatory Visit: Payer: Medicare Other | Admitting: Internal Medicine

## 2011-11-02 DIAGNOSIS — I10 Essential (primary) hypertension: Secondary | ICD-10-CM | POA: Diagnosis not present

## 2011-11-02 DIAGNOSIS — Z471 Aftercare following joint replacement surgery: Secondary | ICD-10-CM | POA: Diagnosis not present

## 2011-11-02 DIAGNOSIS — M6281 Muscle weakness (generalized): Secondary | ICD-10-CM | POA: Diagnosis not present

## 2011-11-02 DIAGNOSIS — F3289 Other specified depressive episodes: Secondary | ICD-10-CM | POA: Diagnosis not present

## 2011-11-02 DIAGNOSIS — F329 Major depressive disorder, single episode, unspecified: Secondary | ICD-10-CM | POA: Diagnosis not present

## 2011-11-02 DIAGNOSIS — R262 Difficulty in walking, not elsewhere classified: Secondary | ICD-10-CM | POA: Diagnosis not present

## 2011-11-04 DIAGNOSIS — M6281 Muscle weakness (generalized): Secondary | ICD-10-CM | POA: Diagnosis not present

## 2011-11-04 DIAGNOSIS — R262 Difficulty in walking, not elsewhere classified: Secondary | ICD-10-CM | POA: Diagnosis not present

## 2011-11-04 DIAGNOSIS — I10 Essential (primary) hypertension: Secondary | ICD-10-CM | POA: Diagnosis not present

## 2011-11-04 DIAGNOSIS — F329 Major depressive disorder, single episode, unspecified: Secondary | ICD-10-CM | POA: Diagnosis not present

## 2011-11-04 DIAGNOSIS — Z471 Aftercare following joint replacement surgery: Secondary | ICD-10-CM | POA: Diagnosis not present

## 2011-11-04 DIAGNOSIS — F3289 Other specified depressive episodes: Secondary | ICD-10-CM | POA: Diagnosis not present

## 2011-11-09 DIAGNOSIS — F329 Major depressive disorder, single episode, unspecified: Secondary | ICD-10-CM | POA: Diagnosis not present

## 2011-11-09 DIAGNOSIS — Z471 Aftercare following joint replacement surgery: Secondary | ICD-10-CM | POA: Diagnosis not present

## 2011-11-09 DIAGNOSIS — I10 Essential (primary) hypertension: Secondary | ICD-10-CM | POA: Diagnosis not present

## 2011-11-09 DIAGNOSIS — F3289 Other specified depressive episodes: Secondary | ICD-10-CM | POA: Diagnosis not present

## 2011-11-09 DIAGNOSIS — R262 Difficulty in walking, not elsewhere classified: Secondary | ICD-10-CM | POA: Diagnosis not present

## 2011-11-09 DIAGNOSIS — M6281 Muscle weakness (generalized): Secondary | ICD-10-CM | POA: Diagnosis not present

## 2011-11-10 DIAGNOSIS — M47817 Spondylosis without myelopathy or radiculopathy, lumbosacral region: Secondary | ICD-10-CM | POA: Diagnosis not present

## 2011-11-12 DIAGNOSIS — F329 Major depressive disorder, single episode, unspecified: Secondary | ICD-10-CM | POA: Diagnosis not present

## 2011-11-12 DIAGNOSIS — Z471 Aftercare following joint replacement surgery: Secondary | ICD-10-CM | POA: Diagnosis not present

## 2011-11-12 DIAGNOSIS — R262 Difficulty in walking, not elsewhere classified: Secondary | ICD-10-CM | POA: Diagnosis not present

## 2011-11-12 DIAGNOSIS — M6281 Muscle weakness (generalized): Secondary | ICD-10-CM | POA: Diagnosis not present

## 2011-11-12 DIAGNOSIS — I10 Essential (primary) hypertension: Secondary | ICD-10-CM | POA: Diagnosis not present

## 2011-11-12 DIAGNOSIS — F3289 Other specified depressive episodes: Secondary | ICD-10-CM | POA: Diagnosis not present

## 2011-11-15 DIAGNOSIS — Z471 Aftercare following joint replacement surgery: Secondary | ICD-10-CM | POA: Diagnosis not present

## 2011-11-15 DIAGNOSIS — R262 Difficulty in walking, not elsewhere classified: Secondary | ICD-10-CM | POA: Diagnosis not present

## 2011-11-15 DIAGNOSIS — M6281 Muscle weakness (generalized): Secondary | ICD-10-CM | POA: Diagnosis not present

## 2011-11-15 DIAGNOSIS — F329 Major depressive disorder, single episode, unspecified: Secondary | ICD-10-CM | POA: Diagnosis not present

## 2011-11-15 DIAGNOSIS — F3289 Other specified depressive episodes: Secondary | ICD-10-CM | POA: Diagnosis not present

## 2011-11-15 DIAGNOSIS — I10 Essential (primary) hypertension: Secondary | ICD-10-CM | POA: Diagnosis not present

## 2011-11-17 DIAGNOSIS — I10 Essential (primary) hypertension: Secondary | ICD-10-CM | POA: Diagnosis not present

## 2011-11-17 DIAGNOSIS — F329 Major depressive disorder, single episode, unspecified: Secondary | ICD-10-CM | POA: Diagnosis not present

## 2011-11-17 DIAGNOSIS — F3289 Other specified depressive episodes: Secondary | ICD-10-CM | POA: Diagnosis not present

## 2011-11-17 DIAGNOSIS — Z471 Aftercare following joint replacement surgery: Secondary | ICD-10-CM | POA: Diagnosis not present

## 2011-11-17 DIAGNOSIS — M6281 Muscle weakness (generalized): Secondary | ICD-10-CM | POA: Diagnosis not present

## 2011-11-17 DIAGNOSIS — R262 Difficulty in walking, not elsewhere classified: Secondary | ICD-10-CM | POA: Diagnosis not present

## 2011-11-24 ENCOUNTER — Other Ambulatory Visit: Payer: Self-pay | Admitting: Internal Medicine

## 2011-11-24 NOTE — Telephone Encounter (Signed)
Refill done.  

## 2011-11-26 ENCOUNTER — Ambulatory Visit (INDEPENDENT_AMBULATORY_CARE_PROVIDER_SITE_OTHER): Payer: Medicare Other | Admitting: Internal Medicine

## 2011-11-26 VITALS — BP 124/72 | HR 71 | Temp 98.1°F | Wt 226.0 lb

## 2011-11-26 DIAGNOSIS — D62 Acute posthemorrhagic anemia: Secondary | ICD-10-CM | POA: Diagnosis not present

## 2011-11-26 DIAGNOSIS — I10 Essential (primary) hypertension: Secondary | ICD-10-CM

## 2011-11-26 DIAGNOSIS — R197 Diarrhea, unspecified: Secondary | ICD-10-CM | POA: Diagnosis not present

## 2011-11-26 LAB — CBC WITH DIFFERENTIAL/PLATELET
Basophils Absolute: 0 10*3/uL (ref 0.0–0.1)
Basophils Relative: 0.5 % (ref 0.0–3.0)
Eosinophils Absolute: 0.1 10*3/uL (ref 0.0–0.7)
Eosinophils Relative: 1.3 % (ref 0.0–5.0)
HCT: 37 % (ref 36.0–46.0)
Hemoglobin: 11.9 g/dL — ABNORMAL LOW (ref 12.0–15.0)
Lymphocytes Relative: 33.4 % (ref 12.0–46.0)
Lymphs Abs: 1.9 10*3/uL (ref 0.7–4.0)
MCHC: 32 g/dL (ref 30.0–36.0)
MCV: 93.6 fl (ref 78.0–100.0)
Monocytes Absolute: 0.3 10*3/uL (ref 0.1–1.0)
Monocytes Relative: 4.5 % (ref 3.0–12.0)
Neutro Abs: 3.5 10*3/uL (ref 1.4–7.7)
Neutrophils Relative %: 60.3 % (ref 43.0–77.0)
Platelets: 186 10*3/uL (ref 150.0–400.0)
RBC: 3.96 Mil/uL (ref 3.87–5.11)
RDW: 13.6 % (ref 11.5–14.6)
WBC: 5.8 10*3/uL (ref 4.5–10.5)

## 2011-11-26 LAB — IRON: Iron: 76 ug/dL (ref 42–145)

## 2011-11-26 LAB — FERRITIN: Ferritin: 67.8 ng/mL (ref 10.0–291.0)

## 2011-11-26 MED ORDER — BENAZEPRIL HCL 10 MG PO TABS: 20.0000 mg | ORAL_TABLET | Freq: Every day | ORAL | Status: DC

## 2011-11-26 MED ORDER — ATENOLOL 25 MG PO TABS: 25.0000 mg | ORAL_TABLET | Freq: Every day | ORAL | Status: DC

## 2011-11-26 NOTE — Assessment & Plan Note (Addendum)
BP was elevated on and off after surgery, today is normal. No change for now. Last  BMP normal

## 2011-11-26 NOTE — Assessment & Plan Note (Signed)
Labs

## 2011-11-26 NOTE — Progress Notes (Signed)
  Subjective:    Patient ID: [name], female    DOB: 05-Oct-1945, 66 y.o.   MRN: [id number]  HPI Office visit DJD, had a total knee replacement 2 months ago, still hurting some, finished physical therapy Diarrhea, had episodes 3 weeks ago and again 5 days ago. The second episode lasted 2 days, she noted her stools to be loose and black. Denies it to be tarry, no fresh blood. No recent antibiotic intake according to the patient. Hypertension, good compliance with medications, BP was been up and down after surgery, BP today. High cholesterol, good medication compliance  Past Medical History:   Hyperlipidemia   Hypertension   Osteoporosis  DJD  MIGRAINE HEADACHE - topamax   Depression   GI  --GERD, GASTRITIS, ESOPHAGITIS, BARRETTS ESOPHAGUS no dysplasia   --EGD 3-11, EGD again 10-11+ Barrett's   --H H Insomnia RLS   MENOPAUSE, SURGICAL   (-) cath 12-2002   01-2010 chest pain, negative stress test, echo EF 55%. See report for other minor abnormalities. CKs slightly elevated w/ normal troponins . eventually had a cardiac catheterization: Negative   Past Surgical History:   Hysterectomy for endometriosis , Oophorectomy   L5-S1 anterior fusion   Bilateral knee arthroscopies   Right-shoulder surgery   Left-arm surgery   Bilateral foot surgeries  09-2011 R TKR  Social History:   divorced, has a son who just moved with her   Tobacco--quit in the 72s Alcohol Use -yes,occasional  Occupation-- retired, post office Diet-- trying to eat healthier lately  Family History: colon ca-- maternal GF, 2 maternal uncles breast ca--sister Esophageal Cancer: Brother lung ca-- M "spleen ca" -- uncle Diabetes--  Aunt, uncle CAD--> CHF F mini strokes--F dementia-- F   Review of Systems Denies abdominal pain other than crampy discomfort before diarrhea. Denies any burning epigastric pain, no heartburn, she does have a lot of flatus. No nausea, vomiting. Denies taking any  Motrin. chart is reviewed, she did have  postop anemia, required a transfusion.     Objective:   Physical Exam General -- alert, well-developed, and overweight appearing. No apparent distress.  HEENT -- not pale Lungs -- normal respiratory effort, no intercostal retractions, no accessory muscle use, and normal breath sounds.   Heart-- normal rate, regular rhythm, no murmur, and no gallop.   Abdomen-- not distended, soft, diffuse, mild tenderness without mass or rebound. Bowel sounds   slight increase   DRE-- brown, slightly loose stools, Hemoccult negative. No  Mass, no blood  Psych-- Cognition and judgment appear intact. Alert and cooperative with normal attention span and concentration.  not anxious appearing and not depressed appearing.       Assessment & Plan:

## 2011-11-26 NOTE — Assessment & Plan Note (Addendum)
Reports dark stools, today on exam they are indeed loose, brown, and Hemoccult negative. Plan: Align, Pepto-Bismol If not better, asked her to call, will get cultures, C. difficile, WBCs

## 2011-11-26 NOTE — Patient Instructions (Signed)
Bland diet, lots of liquids. Align, a probiotic, one tablet daily for a month Pepto-Bismol as it for diarrhea If you get worse, have fever, increased stomach pain over thyroid is not better in the next 10 days, let us know. Reduce aspirin to 81 mg daily

## 2011-11-27 ENCOUNTER — Encounter: Payer: Self-pay | Admitting: Internal Medicine

## 2011-11-29 ENCOUNTER — Encounter: Payer: Self-pay | Admitting: *Deleted

## 2011-12-14 DIAGNOSIS — G43019 Migraine without aura, intractable, without status migrainosus: Secondary | ICD-10-CM | POA: Diagnosis not present

## 2011-12-15 ENCOUNTER — Ambulatory Visit: Payer: Medicare Other | Admitting: Internal Medicine

## 2011-12-27 DIAGNOSIS — M1710 Unilateral primary osteoarthritis, unspecified knee: Secondary | ICD-10-CM | POA: Diagnosis not present

## 2012-02-22 ENCOUNTER — Ambulatory Visit: Payer: Medicare Other | Admitting: Pulmonary Disease

## 2012-03-02 ENCOUNTER — Ambulatory Visit (INDEPENDENT_AMBULATORY_CARE_PROVIDER_SITE_OTHER): Payer: Medicare Other | Admitting: Pulmonary Disease

## 2012-03-02 ENCOUNTER — Encounter: Payer: Self-pay | Admitting: Pulmonary Disease

## 2012-03-02 VITALS — BP 118/76 | HR 72 | Temp 98.2°F | Ht 65.0 in | Wt 228.0 lb

## 2012-03-02 DIAGNOSIS — G2581 Restless legs syndrome: Secondary | ICD-10-CM | POA: Diagnosis not present

## 2012-03-02 NOTE — Patient Instructions (Addendum)
Take requip 1mg  in the afternoon and one in the evening to see if this helps your leg movement.  It is sometimes difficult to distinguish RLS from joint/chronic pain.  Don't forget that exercise 4 hrs before bedtime can also help with your RLS symptoms. Work on weight loss followup with me in one year.

## 2012-03-02 NOTE — Progress Notes (Signed)
  Subjective:    Patient ID: [name], female    DOB: [birth year], 67 y.o.   MRN: [id number]  HPI The patient comes in today for followup of her nose and restless leg syndrome.  She's been staying on Requip at 1-1/2 mg a day total, and overall has been doing fairly well.  She recently has had knee surgery, and it is difficult for her to distinguish between pain and her RLS.  She is having some kicking at night, but is unclear if it may be related to her recent knee surgery.   Review of Systems  Constitutional: Negative for fever and unexpected weight change.  HENT: Positive for congestion and rhinorrhea. Negative for ear pain, nosebleeds, sore throat, sneezing, trouble swallowing, dental problem, postnasal drip and sinus pressure.   Eyes: Negative for redness and itching.  Respiratory: Positive for cough. Negative for chest tightness, shortness of breath and wheezing.   Cardiovascular: Negative for palpitations and leg swelling.  Gastrointestinal: Negative for nausea and vomiting.  Genitourinary: Negative for dysuria.  Musculoskeletal: Negative for joint swelling.  Skin: Negative for rash.  Neurological: Negative for headaches.  Hematological: Does not bruise/bleed easily.  Psychiatric/Behavioral: Negative for dysphoric mood. The patient is not nervous/anxious.        Objective:   Physical Exam Obese female in no acute distress Nose without purulence or discharge noted Neck without lymphadenopathy or thyromegaly Lower extremities without edema, cyanosis Alert and oriented, does not appear to be sleepy, moves all 4 extremities.       Assessment & Plan:

## 2012-03-02 NOTE — Assessment & Plan Note (Signed)
The patient has RLS and is doing fairly well on her dopamine agonist.  It is unclear whether her current symptoms are related to her recent knee surgery, or whether she is having some breakthrough.  Based on her history, I suspect this is her RLS.  She is having more symptoms in the afternoon, and therefore I have asked her to increase her Requip dose in the afternoon to 1 mg.  She is to continue taking 1 mg in the evening as well.  She is to follow up with me in one year, but let us known if she continues to have issues.

## 2012-03-06 ENCOUNTER — Other Ambulatory Visit: Payer: Self-pay | Admitting: Pulmonary Disease

## 2012-03-06 ENCOUNTER — Telehealth: Payer: Self-pay | Admitting: Pulmonary Disease

## 2012-03-06 MED ORDER — ROPINIROLE HCL 1 MG PO TABS: 1.0000 mg | ORAL_TABLET | Freq: Two times a day (BID) | ORAL | Status: DC

## 2012-03-06 NOTE — Telephone Encounter (Signed)
Per OV Note 03/02/12: Patient Instructions     Take requip 1mg  in the afternoon and one in the evening to see if this helps your leg movement. It is sometimes difficult to distinguish RLS from joint/chronic pain.  Don't forget that exercise 4 hrs before bedtime can also help with your RLS symptoms.  Work on weight loss  followup with me in one year   Patient would like Rx sent in to her pharmacy for the new dosing of Requip.  Patient states she only has enough to last her for the week.  Dr. Shelle Iron may this be sent? Please advise thank you.  [facility]

## 2012-03-06 NOTE — Telephone Encounter (Signed)
Ok with me.  Quantity sufficient for a month, with 5 fills.

## 2012-03-06 NOTE — Telephone Encounter (Signed)
Rx was sent to pharm  LMOM for the pt to be made aware 

## 2012-03-21 ENCOUNTER — Telehealth: Payer: Self-pay | Admitting: Pulmonary Disease

## 2012-03-21 ENCOUNTER — Other Ambulatory Visit: Payer: Self-pay | Admitting: Pulmonary Disease

## 2012-03-21 MED ORDER — TRAZODONE HCL 50 MG PO TABS: 50.0000 mg | ORAL_TABLET | Freq: Every day | ORAL | Status: DC

## 2012-03-21 NOTE — Telephone Encounter (Signed)
So you are telling me she has used 50 tabs and used 5 fills in one month?

## 2012-03-21 NOTE — Telephone Encounter (Signed)
trazadone last refilled 02/22/11 #50 x 5 refills.  Last OV 03/02/12 Please advise if okay to refill thanks

## 2012-03-21 NOTE — Telephone Encounter (Signed)
Rx has been sent in, pt is aware. 

## 2012-04-22 ENCOUNTER — Other Ambulatory Visit: Payer: Self-pay | Admitting: Gastroenterology

## 2012-04-24 NOTE — Telephone Encounter (Signed)
NEEDS OFFICE VISIT FOR ANY FURTHER REFILLS! 

## 2012-05-04 ENCOUNTER — Encounter: Payer: Self-pay | Admitting: Gastroenterology

## 2012-05-17 ENCOUNTER — Encounter: Payer: Self-pay | Admitting: Gastroenterology

## 2012-05-26 ENCOUNTER — Other Ambulatory Visit: Payer: Self-pay | Admitting: Internal Medicine

## 2012-05-26 NOTE — Telephone Encounter (Signed)
Refill done.  

## 2012-05-29 ENCOUNTER — Encounter: Payer: Self-pay | Admitting: *Deleted

## 2012-05-29 ENCOUNTER — Encounter: Payer: Self-pay | Admitting: Internal Medicine

## 2012-05-29 ENCOUNTER — Ambulatory Visit (INDEPENDENT_AMBULATORY_CARE_PROVIDER_SITE_OTHER): Payer: Medicare Other | Admitting: Internal Medicine

## 2012-05-29 VITALS — BP 116/80 | HR 61 | Temp 98.3°F | Ht 65.0 in | Wt 227.0 lb

## 2012-05-29 DIAGNOSIS — R42 Dizziness and giddiness: Secondary | ICD-10-CM

## 2012-05-29 DIAGNOSIS — R197 Diarrhea, unspecified: Secondary | ICD-10-CM

## 2012-05-29 DIAGNOSIS — R079 Chest pain, unspecified: Secondary | ICD-10-CM

## 2012-05-29 DIAGNOSIS — E785 Hyperlipidemia, unspecified: Secondary | ICD-10-CM

## 2012-05-29 DIAGNOSIS — I10 Essential (primary) hypertension: Secondary | ICD-10-CM | POA: Diagnosis not present

## 2012-05-29 DIAGNOSIS — N3020 Other chronic cystitis without hematuria: Secondary | ICD-10-CM | POA: Insufficient documentation

## 2012-05-29 DIAGNOSIS — R350 Frequency of micturition: Secondary | ICD-10-CM | POA: Diagnosis not present

## 2012-05-29 LAB — POCT URINALYSIS DIPSTICK
Bilirubin, UA: NEGATIVE
Blood, UA: NEGATIVE
Glucose, UA: NEGATIVE
Ketones, UA: NEGATIVE
Leukocytes, UA: NEGATIVE
Nitrite, UA: NEGATIVE
Spec Grav, UA: >=1.03
Urobilinogen, UA: 0.2
pH, UA: 6

## 2012-05-29 LAB — BASIC METABOLIC PANEL
BUN: 23 mg/dL (ref 6–23)
CO2: 24 mEq/L (ref 19–32)
Calcium: 8.6 mg/dL (ref 8.4–10.5)
Chloride: 110 mEq/L (ref 96–112)
Creatinine, Ser: 1 mg/dL (ref 0.4–1.2)
GFR: 59.35 mL/min — ABNORMAL LOW (ref >60.00)
Glucose, Bld: 91 mg/dL (ref 70–99)
Potassium: 3.8 mEq/L (ref 3.5–5.1)
Sodium: 140 mEq/L (ref 135–145)

## 2012-05-29 LAB — LIPID PANEL
Cholesterol: 163 mg/dL (ref 0–200)
HDL: 37.5 mg/dL — ABNORMAL LOW (ref >39.00)
LDL Cholesterol: 102 mg/dL — ABNORMAL HIGH (ref 0–99)
Total CHOL/HDL Ratio: 4
Triglycerides: 120 mg/dL (ref 0.0–149.0)
VLDL: 24 mg/dL (ref 0.0–40.0)

## 2012-05-29 LAB — HEMOGLOBIN: Hemoglobin: 12.6 g/dL (ref 12.0–15.0)

## 2012-05-29 LAB — AST: AST: 15 U/L (ref 0–37)

## 2012-05-29 LAB — ALT: ALT: 13 U/L (ref 0–35)

## 2012-05-29 LAB — GLUCOSE, POCT (MANUAL RESULT ENTRY): POC Glucose: 87 mg/dl (ref 70–99)

## 2012-05-29 NOTE — Assessment & Plan Note (Signed)
Well-controlled, no change, check a BMP 

## 2012-05-29 NOTE — Progress Notes (Signed)
  Subjective:    Patient ID: [name], female    DOB: 09-23-1945, 67 y.o.   MRN: [id number]  HPI Patient is scheduled a CPX but she is not due, we discussed a number of other concerns: --Dizziness, on and off for several months, described as spinning. Symptoms usually when she stands up or turn her head. Not associated with nausea, headaches, diplopia. --Urinary frequency for months, sometimes  10 or 15 times during the daytime,Urine can be abundant or little. No dysuria, gross hematuria. Denies feeling unusually thirsty, no major changes in her weight. --High cholesterol, good medication compliance. --Hypertension good medication compliance. Ambulatory BPs normal. --When asked, admits to chest pain, on and off for several months, one or 2 episodes a week, located in the medial of anterior chest, occasionally radiates to the back between the shoulders. Symptoms are not exertional, not related to food intake, not burning-like. No radiation to the neck or arm.  Past Medical History:   Hyperlipidemia   Hypertension   Osteoporosis  DJD  MIGRAINE HEADACHE - topamax   Depression   GI  --GERD, GASTRITIS, ESOPHAGITIS, BARRETTS ESOPHAGUS no dysplasia   --EGD 3-11, EGD again 10-11+ Barrett's   --H H Insomnia RLS   MENOPAUSE, SURGICAL   (-) cath 12-2002   01-2010 chest pain, negative stress test, echo EF 55%. See report for other minor abnormalities. CKs slightly elevated w/ normal troponins . eventually had a cardiac catheterization: Negative   Past Surgical History:   Hysterectomy for endometriosis , Oophorectomy   L5-S1 anterior fusion   Bilateral knee arthroscopies   Right-shoulder surgery   Left-arm surgery   Bilateral foot surgeries   09-2011 R TKR  Social History:   divorced, has a son who just moved with her   Tobacco--quit in the 45s Alcohol Use -yes,occasional  Occupation-- retired, post office Diet-- trying to eat healthier lately  Family History: colon ca-- maternal  GF, 2 maternal uncles breast ca--sister Esophageal Cancer: Brother lung ca-- M "spleen ca" -- uncle Diabetes--  Aunt, uncle CAD--> CHF F mini strokes--F dementia-- F   Review of Systems No shortness or breath.  No nausea, vomiting, diarrhea or fever. RLS well-controlled with recent increase in Requip GERD symptoms relatively well control "depending on diet"    Objective:   Physical Exam BP 116/80  Pulse 61  Temp(Src) 98.3 F (36.8 C) (Oral)  Ht 5\' 5"  (1.651 m)  Wt 227 lb (102.967 kg)  BMI 37.77 kg/m2  SpO2 95% General -- alert, well-developed, BMI 37 .   Neck --no JVD Lungs -- normal respiratory effort, no intercostal retractions, no accessory muscle use, and normal breath sounds.   Heart-- normal rate, regular rhythm, no murmur, and no gallop.   Abdomen--Not distended, very mild tenderness in the epigastric area without mass or rebound "this is normal for me".   Extremities-- no pretibial edema bilaterally Neurologic-- alert & oriented X3 and strength normal in all extremities. Psych-- Cognition and judgment appear intact. Alert and cooperative with normal attention span and concentration.  not anxious appearing and not depressed appearing.       Assessment & Plan:   Today , I spent more than  min with the patient, >50% of the time counseling, and extensive chart review regards CP

## 2012-05-29 NOTE — Assessment & Plan Note (Addendum)
udip (-), UCX sent CBG wnl Etiology? Plan: if urine cx (-), will refer to urology b/c sx severe (?OAB)

## 2012-05-29 NOTE — Assessment & Plan Note (Signed)
Due for labs

## 2012-05-29 NOTE — Assessment & Plan Note (Signed)
Had exertional chest pain or shortness of breath in late  2011, admitted to the hospital, had a normal stress myoview and normal LV function by echo and nuclear study.  Subsequently as chest pain continue had a normal cardiac catheterization 2012. Today, she Complains of atypical chest pain. EKG today showed an RBBB which is not new. Plan:  Observation, refer back to cardiology if symptoms increase or change (become more typical)

## 2012-05-29 NOTE — Assessment & Plan Note (Signed)
Dizziness as described at the history of present illness, history of dizziness before, at some point was chronic and saw ENT in  2012. Plan: Recheck a hemoglobin to be sure she is not anemic again. Otherwise observation

## 2012-05-31 ENCOUNTER — Ambulatory Visit (AMBULATORY_SURGERY_CENTER): Payer: Medicare Other | Admitting: *Deleted

## 2012-05-31 VITALS — Ht 60.0 in | Wt 226.8 lb

## 2012-05-31 DIAGNOSIS — K2270 Barrett's esophagus without dysplasia: Secondary | ICD-10-CM

## 2012-05-31 DIAGNOSIS — K219 Gastro-esophageal reflux disease without esophagitis: Secondary | ICD-10-CM

## 2012-05-31 LAB — URINE CULTURE: Colony Count: >=100000

## 2012-05-31 NOTE — Progress Notes (Signed)
Denies allergies to eggs or soy products. Denies complications with anesthesia or sedation. 

## 2012-06-01 MED ORDER — CIPROFLOXACIN HCL 500 MG PO TABS: 500.0000 mg | ORAL_TABLET | Freq: Two times a day (BID) | ORAL | Status: DC

## 2012-06-01 NOTE — Addendum Note (Signed)
Addended by: Edwena Felty T on: 06/01/2012 02:31 PM   Modules accepted: Orders

## 2012-06-12 ENCOUNTER — Encounter: Payer: Self-pay | Admitting: Gastroenterology

## 2012-06-12 ENCOUNTER — Ambulatory Visit (AMBULATORY_SURGERY_CENTER): Payer: Medicare Other | Admitting: Gastroenterology

## 2012-06-12 VITALS — BP 119/62 | HR 65 | Temp 97.6°F | Resp 16 | Ht 65.0 in | Wt 226.0 lb

## 2012-06-12 DIAGNOSIS — I10 Essential (primary) hypertension: Secondary | ICD-10-CM | POA: Diagnosis not present

## 2012-06-12 DIAGNOSIS — K2270 Barrett's esophagus without dysplasia: Secondary | ICD-10-CM

## 2012-06-12 DIAGNOSIS — Z9283 Personal history of failed moderate sedation: Secondary | ICD-10-CM | POA: Diagnosis not present

## 2012-06-12 DIAGNOSIS — K219 Gastro-esophageal reflux disease without esophagitis: Secondary | ICD-10-CM

## 2012-06-12 DIAGNOSIS — G2581 Restless legs syndrome: Secondary | ICD-10-CM | POA: Diagnosis not present

## 2012-06-12 MED ORDER — SODIUM CHLORIDE 0.9 % IV SOLN: 500.0000 mL | INTRAVENOUS | Status: DC

## 2012-06-12 NOTE — Op Note (Signed)
[facility] [address] [place], [address]   ENDOSCOPY PROCEDURE REPORT  PATIENT: [name], [name]  MR#: [id number] BIRTHDATE: 1945/12/21 , 66  yrs. old GENDER: Female ENDOSCOPIST: Meryl Dare, MD, [facility] PROCEDURE DATE:  06/12/2012 PROCEDURE:  EGD w/ biopsy ASA CLASS:     Class II INDICATIONS:  Barrett's screening.   history of esophageal reflux. MEDICATIONS: MAC sedation, administered by CRNA and propofol (Diprivan) 250mg  IV TOPICAL ANESTHETIC: none DESCRIPTION OF PROCEDURE: After the risks benefits and alternatives of the procedure were thoroughly explained, informed consent was obtained.  The LB GIF-H180 [id number] endoscope was introduced through the mouth and advanced to the second portion of the duodenum without limitations.  The instrument was slowly withdrawn as the mucosa was fully examined.   ESOPHAGUS: An irregular Z-line was observed 38 cm from the incisors. Multiple biopsies were performed.   The esophagus was otherwise normal. STOMACH: The mucosa and folds of the stomach appeared normal. DUODENUM: The duodenal mucosa showed no abnormalities in the bulb and second portion of the duodenum.  Retroflexed views revealed a small hiatal hernia.  The scope was then withdrawn from the patient and the procedure completed.  COMPLICATIONS: There were no complications. ENDOSCOPIC IMPRESSION: 1.   Irregular Z-line at 38 cm from the incisors; multiple biopsies; R/O Barretts 2.   Small hiatal hernia  RECOMMENDATIONS: 1.  Await pathology results 2.  Anti-reflux regimen 3.  Continue PPI and H2RA 4.  Follow up EGD in 3 years if Barretts is noted   eSigned:  Meryl Dare, MD, [facility] 06/12/2012 2:20 PM

## 2012-06-12 NOTE — Progress Notes (Signed)
Called to room to assist during endoscopic procedure.  Patient ID and intended procedure confirmed with present staff. Received instructions for my participation in the procedure from the performing physician.  

## 2012-06-12 NOTE — Progress Notes (Signed)
Patient did not experience any of the following events: a burn prior to discharge; a fall within the facility; wrong site/side/patient/procedure/implant event; or a hospital transfer or hospital admission upon discharge from the facility. (G8907) Patient did not have preoperative order for IV antibiotic SSI prophylaxis. (G8918)  

## 2012-06-12 NOTE — Patient Instructions (Signed)
Biopsies taken today. Small hiatal hernia seen. Handouts given on GERD,Barrett's. Resume current medications. Call us with any questions or concerns. Thank you!!  YOU HAD AN ENDOSCOPIC PROCEDURE TODAY AT THE [facility]: Refer to the procedure report that was given to you for any specific questions about what was found during the examination.  If the procedure report does not answer your questions, please call your gastroenterologist to clarify.  If you requested that your care partner not be given the details of your procedure findings, then the procedure report has been included in a sealed envelope for you to review at your convenience later.  YOU SHOULD EXPECT: Some feelings of bloating in the abdomen. Passage of more gas than usual.  Walking can help get rid of the air that was put into your GI tract during the procedure and reduce the bloating. If you had a lower endoscopy (such as a colonoscopy or flexible sigmoidoscopy) you may notice spotting of blood in your stool or on the toilet paper. If you underwent a bowel prep for your procedure, then you may not have a normal bowel movement for a few days.  DIET: Your first meal following the procedure should be a light meal and then it is ok to progress to your normal diet.  A half-sandwich or bowl of soup is an example of a good first meal.  Heavy or fried foods are harder to digest and may make you feel nauseous or bloated.  Likewise meals heavy in dairy and vegetables can cause extra gas to form and this can also increase the bloating.  Drink plenty of fluids but you should avoid alcoholic beverages for 24 hours.  ACTIVITY: Your care partner should take you home directly after the procedure.  You should plan to take it easy, moving slowly for the rest of the day.  You can resume normal activity the day after the procedure however you should NOT DRIVE or use heavy machinery for 24 hours (because of the sedation medicines used during the  test).    SYMPTOMS TO REPORT IMMEDIATELY: A gastroenterologist can be reached at any hour.  During normal business hours, 8:30 AM to 5:00 PM Monday through Friday, call [phone].  After hours and on weekends, please call the GI answering service at [phone] who will take a message and have the physician on call contact you.   Following lower endoscopy (colonoscopy or flexible sigmoidoscopy):  Excessive amounts of blood in the stool  Significant tenderness or worsening of abdominal pains  Swelling of the abdomen that is new, acute  Fever of 100F or higher  Following upper endoscopy (EGD)  Vomiting of blood or coffee ground material  New chest pain or pain under the shoulder blades  Painful or persistently difficult swallowing  New shortness of breath  Fever of 100F or higher  Black, tarry-looking stools  FOLLOW UP: If any biopsies were taken you will be contacted by phone or by letter within the next 1-3 weeks.  Call your gastroenterologist if you have not heard about the biopsies in 3 weeks.  Our staff will call the home number listed on your records the next business day following your procedure to check on you and address any questions or concerns that you may have at that time regarding the information given to you following your procedure. This is a courtesy call and so if there is no answer at the home number and we have not heard from you through the  emergency physician on call, we will assume that you have returned to your regular daily activities without incident.  SIGNATURES/CONFIDENTIALITY: You and/or your care partner have signed paperwork which will be entered into your electronic medical record.  These signatures attest to the fact that that the information above on your After Visit Summary has been reviewed and is understood.  Full responsibility of the confidentiality of this discharge information lies with you and/or your care-partner.

## 2012-06-13 ENCOUNTER — Telehealth: Payer: Self-pay | Admitting: *Deleted

## 2012-06-13 NOTE — Telephone Encounter (Signed)
  Follow up Call-  Call back number 06/12/2012  Post procedure Call Back phone  # [phone]  Permission to leave phone message Yes     Patient questions:  Do you have a fever, pain , or abdominal swelling? no Pain Score  0 *  Have you tolerated food without any problems? yes  Have you been able to return to your normal activities? yes  Do you have any questions about your discharge instructions: Diet   no Medications  no Follow up visit  no  Do you have questions or concerns about your Care? no  Actions: * If pain score is 4 or above: No action needed, pain <4.

## 2012-06-19 ENCOUNTER — Encounter: Payer: Self-pay | Admitting: Gastroenterology

## 2012-06-26 DIAGNOSIS — IMO0002 Reserved for concepts with insufficient information to code with codable children: Secondary | ICD-10-CM | POA: Diagnosis not present

## 2012-06-26 DIAGNOSIS — M1710 Unilateral primary osteoarthritis, unspecified knee: Secondary | ICD-10-CM | POA: Diagnosis not present

## 2012-06-27 DIAGNOSIS — G43019 Migraine without aura, intractable, without status migrainosus: Secondary | ICD-10-CM | POA: Diagnosis not present

## 2012-06-28 DIAGNOSIS — M7150 Other bursitis, not elsewhere classified, unspecified site: Secondary | ICD-10-CM | POA: Diagnosis not present

## 2012-06-30 DIAGNOSIS — M7150 Other bursitis, not elsewhere classified, unspecified site: Secondary | ICD-10-CM | POA: Diagnosis not present

## 2012-07-04 DIAGNOSIS — M7150 Other bursitis, not elsewhere classified, unspecified site: Secondary | ICD-10-CM | POA: Diagnosis not present

## 2012-07-06 DIAGNOSIS — M7150 Other bursitis, not elsewhere classified, unspecified site: Secondary | ICD-10-CM | POA: Diagnosis not present

## 2012-07-10 ENCOUNTER — Other Ambulatory Visit: Payer: Self-pay | Admitting: *Deleted

## 2012-07-10 ENCOUNTER — Ambulatory Visit (INDEPENDENT_AMBULATORY_CARE_PROVIDER_SITE_OTHER): Payer: Medicare Other | Admitting: Internal Medicine

## 2012-07-10 ENCOUNTER — Encounter: Payer: Self-pay | Admitting: Internal Medicine

## 2012-07-10 ENCOUNTER — Other Ambulatory Visit: Payer: Self-pay | Admitting: Pulmonary Disease

## 2012-07-10 ENCOUNTER — Telehealth: Payer: Self-pay | Admitting: Internal Medicine

## 2012-07-10 VITALS — BP 124/82 | HR 66 | Temp 98.7°F | Wt 224.0 lb

## 2012-07-10 DIAGNOSIS — R197 Diarrhea, unspecified: Secondary | ICD-10-CM

## 2012-07-10 MED ORDER — ROPINIROLE HCL 1 MG PO TABS: 1.0000 mg | ORAL_TABLET | Freq: Two times a day (BID) | ORAL | Status: DC

## 2012-07-10 MED ORDER — METRONIDAZOLE 500 MG PO TABS: 500.0000 mg | ORAL_TABLET | Freq: Three times a day (TID) | ORAL | Status: DC

## 2012-07-10 MED ORDER — PRAVASTATIN SODIUM 40 MG PO TABS: 40.0000 mg | ORAL_TABLET | Freq: Every day | ORAL | Status: DC

## 2012-07-10 NOTE — Patient Instructions (Addendum)
Go to the lab and pick up containers for stool studies Send stools for : Culture, C. Difficile, Hemoccult, WBCs --- dx  Diarrhea --- Start Flagyl 3 times a day for 10 days Call anytime if fever, chills, severe diarrhea, unable to drink enough fluids, feeling dizzy or weak, Blood in the stools. Also call if not improving by the next week. ---    Clostridium Difficile Infection Clostridium difficile (C. diff) is a bacteria found in the intestinal tract or colon. Under certain conditions, it causes diarrhea and sometimes severe disease. The severe form of the disease is known as pseudomembranous colitis (often called C. diff colitis). This disease can damage the lining of the colon or cause the colon to become enlarged (toxic megacolon).  CAUSES  Your colon normally contains many different bacteria, including C. diff. The balance of bacteria in your colon can change during illness. This is especially true when you take antibiotic medicine. Taking antibiotics may allow the C. diff to grow, multiply excessively, and make a toxin that then causes illness. The elderly and people with certain medical conditions have a greater risk of getting C. diff infections. SYMPTOMS   Watery diarrhea.  Fever.  Fatigue.  Loss of appetite.  Nausea.  Abdominal swelling, pain, or tenderness.  Dehydration. DIAGNOSIS  Your symptoms may make your caregiver suspicious of a C. diff infection, especially if you have used antibiotics in the preceding weeks. However, there are only 2 ways to know for certain whether you have a C. diff infection:  A lab test that finds the toxin in your stool.  The specific appearance of an abnormality (pseudomembrane) in your colon. This can only be seen by doing a sigmoidoscopy or colonoscopy. These procedures involve passing an instrument through your rectum to look at the inside of your colon. Your caregiver will help determine if these tests are necessary. TREATMENT   Most  people are successfully treated with 1 of 2 specific antibiotics, usually given by mouth. Other antibiotics you are receiving are stopped if possible.  Intravenous (IV) fluids and correction of electrolyte imbalance may be necessary.  Rarely, surgery may be needed to remove the infected part of the intestines.  Careful hand washing by you and your caregivers is important to prevent the spread of infection. In the hospital, your caregivers may also put on gowns and gloves to prevent the spread of the C. diff bacteria. Your room is also cleaned regularly with a hospital grade disinfectant. HOME CARE INSTRUCTIONS  Drink enough fluids to keep your urine clear or pale yellow. Avoid milk, caffeine, and alcohol.  Ask your caregiver for specific rehydration instructions.  Try eating small, frequent meals rather than large meals.  Take your antibiotics as directed. Finish them even if you start to feel better.  Do not use medicines to slow diarrhea. This could delay healing or cause complications.  Wash your hands thoroughly after using the bathroom and before preparing food.  Make sure people who live with you wash their hands often, too. SEEK MEDICAL CARE IF:  Diarrhea persists longer than expected or recurs after completing your course of antibiotic treatment for the C. diff infection.  You have trouble staying hydrated. SEEK IMMEDIATE MEDICAL CARE IF:  You develop a new fever.  You have increasing abdominal pain or tenderness.  There is blood in your stools, or your stools are dark black and tarry.  You cannot hold down food or liquids. MAKE SURE YOU:   Understand these instructions.  Will  watch your condition.  Will get help right away if you are not doing well or get worse. Document Released: 11/04/2004 Document Revised: 04/19/2011 Document Reviewed: 07/03/2010 [facility] Patient Information 2014 [place], [place].

## 2012-07-10 NOTE — Telephone Encounter (Signed)
Pt with pending appt today

## 2012-07-10 NOTE — Telephone Encounter (Signed)
Rx sent 

## 2012-07-10 NOTE — Progress Notes (Signed)
  Subjective:    Patient ID: [name], female    DOB: 08-Oct-1945, 67 y.o.   MRN: [id number]  HPI Chief complaint is dirrhea, Diarrhea started approximately at the time of the upper endoscopy 06/12/2012. Initially he was watery and  on and off. For the last 3 days In addition to the watery stools, has seen some mucus on it. No bloody stools per se but stools are tea-colored to pink For the last 24 hours they have been little more formed, taking OTC imodium and peptobismol. She had approximately 25 BMs in the last 3 days. Per chart review, she received ciprofloxacin for UTI for 06-01-2012.   Past Medical History:   Hyperlipidemia   Hypertension   Osteoporosis   DJD   MIGRAINE HEADACHE - topamax   Depression   GI  --GERD, GASTRITIS, ESOPHAGITIS, BARRETTS ESOPHAGUS no dysplasia   --EGD 3-11, EGD again 10-11+ Barrett's   --H H Insomnia RLS   MENOPAUSE, SURGICAL   (-) cath 12-2002   01-2010 chest pain, negative stress test, echo EF 55%. See report for other minor abnormalities. CKs slightly elevated w/ normal troponins . eventually had a cardiac catheterization: Negative   Past Surgical History:   Hysterectomy for endometriosis , Oophorectomy   L5-S1 anterior fusion   Bilateral knee arthroscopies   Right-shoulder surgery   Left-arm surgery   Bilateral foot surgeries   09-2011 R TKR  Social History:   divorced, has a son who just moved with her   Tobacco--quit in the 12s Alcohol Use -yes,occasional   Occupation-- retired, post office Diet-- trying to eat healthier lately  Family History: colon ca-- maternal GF, 2 maternal uncles breast ca--sister Esophageal Cancer: Brother lung ca-- M "spleen ca" -- uncle Diabetes--  Aunt, uncle CAD--> CHF F mini strokes--F dementia-- F   Review of Systems Appetite is slightly decreased but she has been able to take fluids. She felt slightly weak and dizzy 2 days ago but she is not feeling that way today. No fever chills No  nausea vomiting Abdominal cramps for the last 2 days only. Not taking a new  medication except for the Cipro as described above    Objective:   Physical Exam General -- alert, well-developed, Nontoxic or tachycardic, BP normal. .   Abdomen--Not distended, soft, slightly tender at both lower size without mass or rebound. + Bowel sounds.   Extremities-- no pretibial edema bilaterally Rectal-- No external abnormalities noted. Normal sphincter tone. No rectal masses or tenderness. Small amount of brown loose stool, Hemoccult negative. No blood-mucus  Neurologic-- alert & oriented X3 and strength normal in all extremities. Psych-- Cognition and judgment appear intact. Alert and cooperative with normal attention span and concentration.  not anxious appearing and not depressed appearing.       Assessment & Plan:  Diarrhea, One-month history of diarrhea off and on, stools are tea - pink in color but no frank blood, some mucus. Hemoccult negative. Shortly before the symptoms, she took Cipro. Suspect C. Difficile. Plan:  Stool studies Empiric Flagyl If not better will need further eval. See instructions.

## 2012-07-10 NOTE — Telephone Encounter (Signed)
[facility] rx for Ropinirole 1mg  pt takes 1 tablet bid #60 x5 rx sent nothing further needed. Pt to f/u in 1 yr per last ov.

## 2012-07-10 NOTE — Telephone Encounter (Signed)
Patient Information:  Caller Name: Hildegard  Phone: [phone]  Patient: [name], [name]  Gender: Female  DOB: 1945-07-15  Age: 67 Years  PCP: Willow Ora  Office Follow Up:  Does the office need to follow up with this patient?: No  Instructions For The Office: N/A   Symptoms  Reason For Call & Symptoms: diarrhea.  Pt reports she had diarrhea that began 3-4 weeks ago, it stopped for a week and a half and it began again on Saturday. In the past 24 hours pt reports 10 episodes of diarrhea  Reviewed Health History In EMR: Yes  Reviewed Medications In EMR: Yes  Reviewed Allergies In EMR: Yes  Reviewed Surgeries / Procedures: Yes  Date of Onset of Symptoms: 07/08/2012  Treatments Tried: Immodium AD, Pepto Bismol  Treatments Tried Worked: Yes  Guideline(s) Used:  Diarrhea  Disposition Per Guideline:   Go to Office Now  Reason For Disposition Reached:   Age > 60 years and has had > 6 diarrhea stools in past 24 hours  Advice Given:  Fluids:  Drink more fluids, at least 8-10 glasses (8 oz or 240 ml) daily.  Patient Will Follow Care Advice:  YES  Appointment Scheduled:  07/10/2012 13:45:00 Appointment Scheduled Provider:  Willow Ora

## 2012-07-11 ENCOUNTER — Other Ambulatory Visit: Payer: Self-pay | Admitting: Gastroenterology

## 2012-07-11 DIAGNOSIS — R197 Diarrhea, unspecified: Secondary | ICD-10-CM | POA: Diagnosis not present

## 2012-07-11 NOTE — Addendum Note (Signed)
Addended by: Silvio Pate D on: 07/11/2012 03:39 PM   Modules accepted: Orders

## 2012-07-12 DIAGNOSIS — M7150 Other bursitis, not elsewhere classified, unspecified site: Secondary | ICD-10-CM | POA: Diagnosis not present

## 2012-07-13 LAB — FECAL LACTOFERRIN, QUANT: Lactoferrin: POSITIVE

## 2012-07-14 ENCOUNTER — Telehealth: Payer: Self-pay | Admitting: *Deleted

## 2012-07-14 DIAGNOSIS — M7150 Other bursitis, not elsewhere classified, unspecified site: Secondary | ICD-10-CM | POA: Diagnosis not present

## 2012-07-14 NOTE — Telephone Encounter (Signed)
Left detailed msg on pt's vmail.  Pt brought it in on Monday & it's "being processed"

## 2012-07-14 NOTE — Telephone Encounter (Signed)
Spoke to pt, she states she is feeling somewhat better. She states that she is no longer have diarrhea but does still feel nauseous from time to time.

## 2012-07-14 NOTE — Telephone Encounter (Signed)
Wanda Plump, MD - seen w/ diarrhea, better ?   lmovm for pt to return call.

## 2012-07-14 NOTE — Telephone Encounter (Signed)
Advise patient to call if not completely well in few days. Also, I requested a C. Difficile in the stools, I don't see the results. Could you  followup on that?

## 2012-07-17 LAB — CLOSTRIDIUM DIFFICILE CULTURE-FECAL

## 2012-07-17 NOTE — Telephone Encounter (Signed)
Still pending, call lab

## 2012-07-17 NOTE — Telephone Encounter (Signed)
Discuss with patient  

## 2012-07-17 NOTE — Telephone Encounter (Addendum)
Results placed on ledge for review other results are still pending per lab

## 2012-07-17 NOTE — Telephone Encounter (Signed)
Stool culture showed C. difficile. Anticipate she will get better with antibiotics, encourage her to take it for 10 days and call if not completely well soon

## 2012-07-18 LAB — CLOSTRIDIUM DIFFICILE TOXIN: C diff Toxin A: DETECTED — AB

## 2012-07-19 DIAGNOSIS — M7150 Other bursitis, not elsewhere classified, unspecified site: Secondary | ICD-10-CM | POA: Diagnosis not present

## 2012-07-21 DIAGNOSIS — M7150 Other bursitis, not elsewhere classified, unspecified site: Secondary | ICD-10-CM | POA: Diagnosis not present

## 2012-07-26 ENCOUNTER — Telehealth: Payer: Self-pay | Admitting: Internal Medicine

## 2012-07-26 NOTE — Telephone Encounter (Signed)
Patient called stating her physical therapist Greig Castilla) will be calling today to ask questions about her + cdiff results. She states it is ok to speak with him and answer any questions he has.

## 2012-07-26 NOTE — Telephone Encounter (Signed)
Left message on voicemail for [name] informing him of Dr.Paz's response. Instructed to call back if questions or concerns

## 2012-07-26 NOTE — Telephone Encounter (Signed)
She had appropriate treatment, if she is asymptomatic, no further precautions are needed.

## 2012-07-26 NOTE — Telephone Encounter (Signed)
?   If patient has a diagnosis of cdiff (informed yes per recent lab report), if yes Mardelle Matte questions if patient should be out in a public facility (Example-Out Patient Physical Therapy), Mardelle Matte states that he has heard that if patients have a diagnosis of Cdiff they really should not leave there home unless it is mandatory. Please advise if patient should hold off on Physical Therapy until diagnosis of Cdiff resolved completely. Mardelle Matte can be reached at [phone]

## 2012-07-31 ENCOUNTER — Telehealth: Payer: Self-pay | Admitting: Internal Medicine

## 2012-07-31 NOTE — Telephone Encounter (Signed)
Scheduled pt for 6.24.14 @ 330p.

## 2012-07-31 NOTE — Telephone Encounter (Signed)
Patient Information:  Caller Name: Na  Phone: [phone]  Patient: [name], [name]  Gender: Female  DOB: 06-Aug-1945  Age: 67 Years  PCP: Willow Ora  Office Follow Up:  Does the office need to follow up with this patient?: No  Instructions For The Office: N/A  RN Note:  Declined to go to [facility] now; prefers appointment for 08/01/12. Transferred to office/Diane for appointment 08/01/12 per caller request.   Symptoms  Reason For Call & Symptoms: Dysuria, frequency and strong odor to urine with mild diarrhea that slowed after began [facility] Urinary Relief tablets.  Treated with Flagyl 07/10/12 for 10 days for C-Diff diarrhea.  Reviewed Health History In EMR: Yes  Reviewed Medications In EMR: Yes  Reviewed Allergies In EMR: Yes  Reviewed Surgeries / Procedures: Yes  Date of Onset of Symptoms: 07/28/2012  Treatments Tried: Urinary analgesic.  Treatments Tried Worked: Yes  Guideline(s) Used:  Urination Pain - Female  Disposition Per Guideline:   See Today in Office  Reason For Disposition Reached:   > 2 UTIs in last year  Advice Given:  Fluids:   Drink extra fluids. Drink 8-10 glasses of liquids a day (Reason: to produce a dilute, non-irritating urine).  Cranberry Juice:   Some people think that drinking cranberry juice may help in fighting urinary tract infections. However, there is no good research that has ever proved this.  Dosage Cranberry Juice Cocktail: 8 oz (240 ml) twice a day.  Dosage 100% Cranberry Juice: 1 oz (30 ml) twice a day.  Warm Saline SITZ Baths to Reduce Pain:  Sit in a warm saline bath for 20 minutes to cleanse the area and to reduce pain. Add 2 oz. of table salt or baking soda to a tub of water.  CAUTION - Phenazopyridine:  It turns the urine bright orange. This can cause staining of underwear. It can also sometimes stain contact lenses.  Call Back If:   You become worse.  RN Overrode Recommendation:  Make Appointment  Requested appointment for  08/01/12.

## 2012-08-01 ENCOUNTER — Encounter: Payer: Self-pay | Admitting: Internal Medicine

## 2012-08-01 ENCOUNTER — Ambulatory Visit (INDEPENDENT_AMBULATORY_CARE_PROVIDER_SITE_OTHER): Payer: Medicare Other | Admitting: Internal Medicine

## 2012-08-01 VITALS — BP 122/82 | HR 84 | Temp 97.9°F | Wt 223.6 lb

## 2012-08-01 DIAGNOSIS — R350 Frequency of micturition: Secondary | ICD-10-CM | POA: Diagnosis not present

## 2012-08-01 DIAGNOSIS — A0472 Enterocolitis due to Clostridium difficile, not specified as recurrent: Secondary | ICD-10-CM | POA: Diagnosis not present

## 2012-08-01 HISTORY — DX: Enterocolitis due to Clostridium difficile, not specified as recurrent: A04.72

## 2012-08-01 LAB — POCT URINALYSIS DIPSTICK
Bilirubin, UA: NEGATIVE
Blood, UA: NEGATIVE
Glucose, UA: NEGATIVE
Ketones, UA: NEGATIVE
Nitrite, UA: NEGATIVE
Protein, UA: NEGATIVE
Spec Grav, UA: 1.015
Urobilinogen, UA: 0.2
pH, UA: 6

## 2012-08-01 MED ORDER — VANCOMYCIN HCL 125 MG PO CAPS: 125.0000 mg | ORAL_CAPSULE | Freq: Four times a day (QID) | ORAL | Status: DC

## 2012-08-01 NOTE — Assessment & Plan Note (Addendum)
She was Rx  Cipro for a UTI in April, diagnosed with C. Difficile diarrhea 07/10/2012, status post 10 days of Flagyl. She improved however presents today with 1 day h/o soft BMs, x8 today,   and a ill defined post prandial stomach dyscomfort, see HPI. She also has some urinary symptoms. Given the recently documented c diff Infection I think is appropriate to give a second round of antibiotics, I  discussed with GI (Dr Russella Dar, primary GI), and he agrees., recommend vancomycin from 10-14 days. Plan: Vancomycin for 10-14 days. Check a UA and a urine culture, treat if a  UTI is documented  If she needs an antibiotic for a UTI, will need vancomycin for 14 days Recommendation if symptoms severe  needs to call or go to the ER. If not improving from the GI stand point needs to see GI

## 2012-08-01 NOTE — Patient Instructions (Addendum)
Take it vancomycin 4 times a day for 10 days. We are sending a urine culture, if you end up needing an antibiotic for a UTI---> then take vancomycin for a total of 14 days. Call if you have increased symptoms, fever, chills, increase pain or blood in the stools. ER if symptoms severe. Come back in one month for a checkup.

## 2012-08-01 NOTE — Progress Notes (Signed)
  Subjective:    Patient ID: [name], female    DOB: [birth year], 67 y.o.   MRN: [id number]  HPI Visit to discuss the following issues: 4 days ago developed urinary frequency, "every 5 minutes" and mild difficulty urinating. She took a UTI  OTC medicine, it turned her Urine orange, today she feels better. She also reports her stomach is not back to normal, she was diagnosed with C. Difficile 07/10/2012, took Flagyl. Symptoms improved temporarily but shortly after she finished antibiotics developed ill-defined lower abdominal discomfort postprandially. Today she  had 8 bowel movements, not watery stools but they are very soft.  Past Medical History:   Hyperlipidemia   Hypertension   Osteoporosis   DJD   MIGRAINE HEADACHE - topamax   Depression   GI  --GERD, GASTRITIS, ESOPHAGITIS, BARRETTS ESOPHAGUS no dysplasia   --EGD 3-11, EGD again 10-11+ Barrett's   --H H Insomnia RLS   MENOPAUSE, SURGICAL   (-) cath 12-2002   01-2010 chest pain, negative stress test, echo EF 55%. See report for other minor abnormalities. CKs slightly elevated w/ normal troponins . eventually had a cardiac catheterization: Negative   Past Surgical History:   Hysterectomy for endometriosis , Oophorectomy   L5-S1 anterior fusion   Bilateral knee arthroscopies   Right-shoulder surgery   Left-arm surgery   Bilateral foot surgeries   09-2011 R TKR  Social History:   divorced, has a son who just moved with her   Tobacco--quit in the 64s Alcohol Use -yes,occasional   Occupation-- retired, post office Diet-- trying to eat healthier lately  Family History: colon ca-- maternal GF, 2 maternal uncles breast ca--sister Esophageal Cancer: Brother lung ca-- M "spleen ca" -- uncle Diabetes--  Aunt, uncle CAD--> CHF F mini strokes--F dementia-- F   Review of Systems No fever chills. No nausea- vomiting. No blood in the stools. No vaginal discharge or vaginal dryness. No heartburn per se.      Objective:   Physical Exam BP 122/82  Pulse 84  Temp(Src) 97.9 F (36.6 C) (Oral)  Wt 223 lb 9.6 oz (101.424 kg)  BMI 37.21 kg/m2  SpO2 98% General -- alert, well-developed, Nontoxic appearing .   Abdomen--Not distended, soft, normal bowel sounds, mild tenderness at the lower abdomen, slightly worse on the left, no mass or rebound. Extremities-- no pretibial edema bilaterally  Neurologic-- alert & oriented X3 and strength normal in all extremities. Psych-- Cognition and judgment appear intact. Alert and cooperative with normal attention span and concentration.  not anxious appearing and not depressed appearing.        Assessment & Plan:

## 2012-08-02 LAB — URINALYSIS
Bilirubin Urine: NEGATIVE
Glucose, UA: NEGATIVE mg/dL
Hgb urine dipstick: NEGATIVE
Ketones, ur: NEGATIVE mg/dL
Nitrite: POSITIVE — AB
Protein, ur: NEGATIVE mg/dL
Specific Gravity, Urine: 1.02 (ref 1.005–1.030)
Urobilinogen, UA: 0.2 mg/dL (ref 0.0–1.0)
pH: 6 (ref 5.0–8.0)

## 2012-08-04 LAB — URINE CULTURE: Colony Count: >=100000

## 2012-08-04 MED ORDER — SULFAMETHOXAZOLE-TRIMETHOPRIM 800-160 MG PO TABS: 1.0000 | ORAL_TABLET | Freq: Two times a day (BID) | ORAL | Status: DC

## 2012-08-10 ENCOUNTER — Other Ambulatory Visit: Payer: Self-pay | Admitting: Pulmonary Disease

## 2012-08-13 ENCOUNTER — Telehealth: Payer: Self-pay | Admitting: Internal Medicine

## 2012-08-13 ENCOUNTER — Encounter: Payer: Self-pay | Admitting: Internal Medicine

## 2012-08-13 DIAGNOSIS — A0472 Enterocolitis due to Clostridium difficile, not specified as recurrent: Secondary | ICD-10-CM

## 2012-08-13 NOTE — Telephone Encounter (Signed)
See last OV, had diarrhea and a UTI. Feeling better? Let me know

## 2012-08-14 MED ORDER — VANCOMYCIN HCL 125 MG PO CAPS: 125.0000 mg | ORAL_CAPSULE | Freq: Four times a day (QID) | ORAL | Status: DC

## 2012-08-14 NOTE — Telephone Encounter (Signed)
Left message to return call 

## 2012-08-14 NOTE — Telephone Encounter (Signed)
rx sent to pharmacy per orders. Spoke with patient, verbalized understanding.

## 2012-08-14 NOTE — Telephone Encounter (Signed)
Spoke with patient states UTI is better but still having diarrhea.

## 2012-08-14 NOTE — Telephone Encounter (Signed)
Call one additional week of vancomycin, if not better next week , let me know. Call anytime if sx severe

## 2012-08-28 ENCOUNTER — Ambulatory Visit (INDEPENDENT_AMBULATORY_CARE_PROVIDER_SITE_OTHER): Payer: Medicare Other | Admitting: Internal Medicine

## 2012-08-28 ENCOUNTER — Encounter: Payer: Self-pay | Admitting: Lab

## 2012-08-28 ENCOUNTER — Encounter: Payer: Self-pay | Admitting: Internal Medicine

## 2012-08-28 VITALS — BP 124/82 | HR 69 | Temp 98.0°F | Wt 224.0 lb

## 2012-08-28 DIAGNOSIS — M810 Age-related osteoporosis without current pathological fracture: Secondary | ICD-10-CM

## 2012-08-28 DIAGNOSIS — Z0000 Encounter for general adult medical examination without abnormal findings: Secondary | ICD-10-CM | POA: Diagnosis not present

## 2012-08-28 DIAGNOSIS — G2581 Restless legs syndrome: Secondary | ICD-10-CM | POA: Diagnosis not present

## 2012-08-28 DIAGNOSIS — G43909 Migraine, unspecified, not intractable, without status migrainosus: Secondary | ICD-10-CM

## 2012-08-28 DIAGNOSIS — E785 Hyperlipidemia, unspecified: Secondary | ICD-10-CM

## 2012-08-28 DIAGNOSIS — I10 Essential (primary) hypertension: Secondary | ICD-10-CM

## 2012-08-28 DIAGNOSIS — A0472 Enterocolitis due to Clostridium difficile, not specified as recurrent: Secondary | ICD-10-CM

## 2012-08-28 MED ORDER — VANCOMYCIN HCL 125 MG PO CAPS: 125.0000 mg | ORAL_CAPSULE | Freq: Four times a day (QID) | ORAL | Status: DC

## 2012-08-28 NOTE — Patient Instructions (Addendum)
Back on vancomycin until you see GI Next visit 4-5 months    Fall Prevention and Home Safety Falls cause injuries and can affect all age groups. It is possible to use preventive measures to significantly decrease the likelihood of falls. There are many simple measures which can make your home safer and prevent falls. OUTDOORS  Repair cracks and edges of walkways and driveways.  Remove high doorway thresholds.  Trim shrubbery on the main path into your home.  Have good outside lighting.  Clear walkways of tools, rocks, debris, and clutter.  Check that handrails are not broken and are securely fastened. Both sides of steps should have handrails.  Have leaves, snow, and ice cleared regularly.  Use sand or salt on walkways during winter months.  In the garage, clean up grease or oil spills. BATHROOM  Install night lights.  Install grab bars by the toilet and in the tub and shower.  Use non-skid mats or decals in the tub or shower.  Place a plastic non-slip stool in the shower to sit on, if needed.  Keep floors dry and clean up all water on the floor immediately.  Remove soap buildup in the tub or shower on a regular basis.  Secure bath mats with non-slip, double-sided rug tape.  Remove throw rugs and tripping hazards from the floors. BEDROOMS  Install night lights.  Make sure a bedside light is easy to reach.  Do not use oversized bedding.  Keep a telephone by your bedside.  Have a firm chair with side arms to use for getting dressed.  Remove throw rugs and tripping hazards from the floor. KITCHEN  Keep handles on pots and pans turned toward the center of the stove. Use back burners when possible.  Clean up spills quickly and allow time for drying.  Avoid walking on wet floors.  Avoid hot utensils and knives.  Position shelves so they are not too high or low.  Place commonly used objects within easy reach.  If necessary, use a sturdy step stool with a  grab bar when reaching.  Keep electrical cables out of the way.  Do not use floor polish or wax that makes floors slippery. If you must use wax, use non-skid floor wax.  Remove throw rugs and tripping hazards from the floor. STAIRWAYS  Never leave objects on stairs.  Place handrails on both sides of stairways and use them. Fix any loose handrails. Make sure handrails on both sides of the stairways are as long as the stairs.  Check carpeting to make sure it is firmly attached along stairs. Make repairs to worn or loose carpet promptly.  Avoid placing throw rugs at the top or bottom of stairways, or properly secure the rug with carpet tape to prevent slippage. Get rid of throw rugs, if possible.  Have an electrician put in a light switch at the top and bottom of the stairs. OTHER FALL PREVENTION TIPS  Wear low-heel or rubber-soled shoes that are supportive and fit well. Wear closed toe shoes.  When using a stepladder, make sure it is fully opened and both spreaders are firmly locked. Do not climb a closed stepladder.  Add color or contrast paint or tape to grab bars and handrails in your home. Place contrasting color strips on first and last steps.  Learn and use mobility aids as needed. Install an electrical emergency response system.  Turn on lights to avoid dark areas. Replace light bulbs that burn out immediately. Get light switches that  glow.  Arrange furniture to create clear pathways. Keep furniture in the same place.  Firmly attach carpet with non-skid or double-sided tape.  Eliminate uneven floor surfaces.  Select a carpet pattern that does not visually hide the edge of steps.  Be aware of all pets. OTHER HOME SAFETY TIPS  Set the water temperature for 120 F (48.8 C).  Keep emergency numbers on or near the telephone.  Keep smoke detectors on every level of the home and near sleeping areas. Document Released: 01/15/2002 Document Revised: 07/27/2011 Document  Reviewed: 04/16/2011 [facility] Patient Information 2014 [place], [place].

## 2012-08-28 NOTE — Assessment & Plan Note (Signed)
Well-controlled, no change 

## 2012-08-28 NOTE — Assessment & Plan Note (Signed)
Well controlled with Topamax 2 tablets daily, HAs resurfaced when she  decrease Topamax to 1.5 tablet daily

## 2012-08-28 NOTE — Progress Notes (Signed)
Subjective:    Patient ID: [name], female    DOB: [birth year], 67 y.o.   MRN: [id number]  HPI  Here for Medicare AWV:  1. Risk factors based on Past M, S, F history: reviewed  2. Physical Activities: does home chores, limited  yard work d/t back-knee  pain.  3. Depression/mood:   occ depress, not severe, counseled  4. Hearing: no problems reported or noted , has tinnitus, s/p ENT eval, they rec observation, sx stable  5. ADL's: independent  6. Fall Risk: no recent falls , prevention discussed  7. Home Safety: does feel safe at home  8. Height, weight, &visual acuity: see VS , due to see the eye doctor, plans to call  9. Counseling: yes  10. Labs ordered based on risk factors: yes  11. Referral Coordination, if needed  12. Cognitive Assessment: cognition and memory wnl, motor skills limited by pain and deconditioning   In addition, we discussed the following: C diff-- Had  additional vancomycin, felt well temporarily, but few days ago started back w/  soft-loose stools and abdominal cramping. Denies fever chills, watery-bloody  Stools. Hypertension, good medication compliance, ambulatory BPs normal. RLS, sx  well-controlled as long as she takes her medication. High chol, good medication compliance. Migraine headaches, doing great with Topamax 2 tablets daily. DJD, on as needed Celebrex and hydrocodone.   Past Medical History:   Hyperlipidemia   Hypertension   Osteoporosis   DJD   MIGRAINE HEADACHE - topamax   Depression   GI  --GERD, GASTRITIS, ESOPHAGITIS, BARRETTS ESOPHAGUS no dysplasia   --EGD 3-11, EGD again 10-11+ Barrett's   --H H Insomnia RLS   MENOPAUSE, SURGICAL   (-) cath 12-2002   01-2010 chest pain, negative stress test, echo EF 55%. See report for other minor abnormalities. CKs slightly elevated w/ normal troponins . eventually had a cardiac catheterization: Negative   Past Surgical History:   Hysterectomy for endometriosis , Oophorectomy   L5-S1  anterior fusion   Bilateral knee arthroscopies   Right-shoulder surgery   Left-arm surgery   Bilateral foot surgeries   09-2011 R TKR  Social History:   divorced, has one son who lives w/ her Tobacco--quit in the 52s Alcohol Use -yes,occasional   Occupation-- retired, post office   Family History: colon ca-- maternal GF, 2 maternal uncles breast ca--sister bladder ca-- mother  Esophageal Cancer: Brother lung ca-- M "spleen ca" -- uncle Diabetes--  Aunt, uncle CAD--> CHF F mini strokes--F dementia-- F   Review of Systems Diet-- not healthy recently  Exercise- limited  No chest pain or shortness or breath No nausea, vomiting. No dysuria, gross hematuria, vaginal bleeding or vaginal discharge.     Objective:   Physical Exam BP 124/82  Pulse 69  Temp(Src) 98 F (36.7 C) (Oral)  Wt 224 lb (101.606 kg)  BMI 37.28 kg/m2  SpO2 96%\ General -- alert, well-developed, NAD .   Neck --no thyromegaly Lungs -- normal respiratory effort, no intercostal retractions, no accessory muscle use, and normal breath sounds.   Heart-- normal rate, regular rhythm, no murmur, and no gallop.   Abdomen--Not distended, soft, good bowel sounds, mild tenderness in the mid upper and  bilateral lower abdomen without mass or rebound..   Extremities-- no pretibial edema bilaterally  Neurologic-- alert & oriented X3 and strength normal in all extremities. Psych-- Cognition and judgment appear intact. Alert and cooperative with normal attention span and concentration.  not anxious appearing and not depressed appearing.  Assessment & Plan:

## 2012-08-28 NOTE — Assessment & Plan Note (Signed)
Well-controlled, no change, recent BMP okay. Return to the office for 4-5 months

## 2012-08-28 NOTE — Assessment & Plan Note (Addendum)
Since her last office visit, responded well to vancomycin but when she  finish the treatment the symptoms came back. An additional week of vancomycin was rx , she felt better but again symptoms are resurfacing Plan: Rx Vancomycin, see GI ASAP, referral enter

## 2012-08-28 NOTE — Assessment & Plan Note (Signed)
Symptoms well controlled 

## 2012-08-28 NOTE — Assessment & Plan Note (Signed)
Td 2000 and 2012   Pneumonia shot-- 2012 shingles inmunization  benefits discussed, Rx provided 2013, has not gotten it yet   Colonoscopy 03-2006, Colonoscopy 10-11 , essentially negative,  next  colonoscopy 2016  Has not seen gyn (Dr Kalman Drape) in a while, no recent  MMG --- will refer   counseled about diet and exercise

## 2012-08-28 NOTE — Assessment & Plan Note (Signed)
Check a bone density test 

## 2012-08-29 ENCOUNTER — Telehealth: Payer: Self-pay | Admitting: Gastroenterology

## 2012-08-29 NOTE — Telephone Encounter (Signed)
Patient is scheduled with Mike Gip PA for tomorrow at 10:30

## 2012-08-30 ENCOUNTER — Other Ambulatory Visit (INDEPENDENT_AMBULATORY_CARE_PROVIDER_SITE_OTHER): Payer: Medicare Other

## 2012-08-30 ENCOUNTER — Encounter: Payer: Self-pay | Admitting: Physician Assistant

## 2012-08-30 ENCOUNTER — Ambulatory Visit (INDEPENDENT_AMBULATORY_CARE_PROVIDER_SITE_OTHER): Payer: Medicare Other | Admitting: Physician Assistant

## 2012-08-30 VITALS — BP 110/70 | HR 72 | Ht 64.5 in | Wt 224.5 lb

## 2012-08-30 DIAGNOSIS — A0472 Enterocolitis due to Clostridium difficile, not specified as recurrent: Secondary | ICD-10-CM

## 2012-08-30 LAB — CBC WITH DIFFERENTIAL/PLATELET
Basophils Absolute: 0 10*3/uL (ref 0.0–0.1)
Basophils Relative: 0.3 % (ref 0.0–3.0)
Eosinophils Absolute: 0.1 10*3/uL (ref 0.0–0.7)
Eosinophils Relative: 0.8 % (ref 0.0–5.0)
HCT: 36.9 % (ref 36.0–46.0)
Hemoglobin: 12.4 g/dL (ref 12.0–15.0)
Lymphocytes Relative: 36.2 % (ref 12.0–46.0)
Lymphs Abs: 2.7 10*3/uL (ref 0.7–4.0)
MCHC: 33.7 g/dL (ref 30.0–36.0)
MCV: 93.7 fl (ref 78.0–100.0)
Monocytes Absolute: 0.4 10*3/uL (ref 0.1–1.0)
Monocytes Relative: 5.6 % (ref 3.0–12.0)
Neutro Abs: 4.2 10*3/uL (ref 1.4–7.7)
Neutrophils Relative %: 57.1 % (ref 43.0–77.0)
Platelets: 179 10*3/uL (ref 150.0–400.0)
RBC: 3.94 Mil/uL (ref 3.87–5.11)
RDW: 13.8 % (ref 11.5–14.6)
WBC: 7.4 10*3/uL (ref 4.5–10.5)

## 2012-08-30 LAB — BASIC METABOLIC PANEL
BUN: 20 mg/dL (ref 6–23)
CO2: 22 mEq/L (ref 19–32)
Calcium: 8.8 mg/dL (ref 8.4–10.5)
Chloride: 111 mEq/L (ref 96–112)
Creatinine, Ser: 1.2 mg/dL (ref 0.4–1.2)
GFR: 49.89 mL/min — ABNORMAL LOW (ref >60.00)
Glucose, Bld: 100 mg/dL — ABNORMAL HIGH (ref 70–99)
Potassium: 4.2 mEq/L (ref 3.5–5.1)
Sodium: 139 mEq/L (ref 135–145)

## 2012-08-30 MED ORDER — SACCHAROMYCES BOULARDII 250 MG PO CAPS: 250.0000 mg | ORAL_CAPSULE | Freq: Two times a day (BID) | ORAL | Status: AC

## 2012-08-30 MED ORDER — VANCOMYCIN HCL 125 MG PO CAPS: ORAL_CAPSULE | ORAL | Status: DC

## 2012-08-30 NOTE — Progress Notes (Signed)
Reviewed and agree with management plan.  Malcolm T. Stark, MD FACG 

## 2012-08-30 NOTE — Patient Instructions (Addendum)
Please go to the basement level to have your labs drawn.  We sent prescriptions to [facility] for the Vancomycin and the Florastor probiotic.  We made you a follow up appointment with Dr. Russella Dar on 10-13-2012 at 11:15 am.

## 2012-08-30 NOTE — Progress Notes (Signed)
Subjective:    Patient ID: [name], female    DOB: 1945/11/13, 67 y.o.   MRN: [id number]  HPI [name] is a very nice 67 year old white female known to Dr. Russella Dar with history of Barrett's esophagus. She is referred currently by 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 Patient had been treated with a course of Cipro in April of 2014 for a urinary tract infection and then developed diarrhea in May. She was seen by [name] and had stool for C. difficile done which was positive by toxin assay. She was treated with a course of Flagyl and says that her symptoms did improve with the Flagyl. However after stopping the Flagyl her diarrhea recurred. She was seen on 08/01/2012 and at that time was also complaining of urinary frequency and was found to have a urinary tract infection. She was placed on a course of Bactrim and was also treated with a 14 day course of vancomycin. Patient says that she felt much better on vancomycin and again the diarrhea resolved. About 5 days after stopping the vancomycin her symptoms have recurred again. She says she knows the C. difficile is back because she feels awful. She says she's very exhausted and fatigued and has no appetite. She is currently having soft to loose stools which have been frequent an urgent over the past for 5 days. She says the diarrhea is nothing like it had been previously. She's not seen any melena or hematochezia. She's not had any documented fever or chills. She thinks she had about 7 bowel movements yesterday and has had 3 bowel movements today so far.    Review of Systems  Constitutional: Positive for appetite change and fatigue.  HENT: Negative.   Eyes: Negative.   Respiratory: Negative.   Cardiovascular: Negative.   Gastrointestinal: Positive for abdominal pain and diarrhea.  Endocrine: Negative.   Genitourinary: Negative.   Musculoskeletal: Negative.   Skin: Negative.   Allergic/Immunologic: Negative.   Hematological:  Negative.   Psychiatric/Behavioral: Negative.    Outpatient Prescriptions Prior to Visit  Medication Sig Dispense Refill  . aspirin EC 81 MG tablet Take 81 mg by mouth daily.      [name] atenolol (TENORMIN) 25 MG tablet Take 1 tablet (25 mg total) by mouth daily.  90 tablet  3  . benazepril (LOTENSIN) 10 MG tablet Take 20 mg by mouth daily.       . celecoxib (CELEBREX) 200 MG capsule Take 200 mg by mouth as needed for pain.      . cyclobenzaprine (FLEXERIL) 10 MG tablet Take 10 mg by mouth 3 (three) times daily as needed. For migraines.      [name] HYDROcodone-acetaminophen (NORCO) 10-325 MG per tablet Take 1 tablet by mouth every 4 (four) hours as needed for pain. For back pain  60 tablet  0  . omeprazole (PRILOSEC) 40 MG capsule Take 1 capsule (40 mg total) by mouth 2 (two) times daily.  180 capsule  3  . Polyethyl Glycol-Propyl Glycol (SYSTANE OP) Place 2 drops into both eyes daily as needed. For dry or burning eyes      . pravastatin (PRAVACHOL) 40 MG tablet Take 1 tablet (40 mg total) by mouth daily.  90 tablet  0  . ranitidine (ZANTAC) 300 MG tablet TAKE 1 TABLET (300 MG TOTAL) BY MOUTH AT BEDTIME.  90 tablet  1  . rOPINIRole (REQUIP) 1 MG tablet Take 1 tablet (1 mg total) by mouth 2 (two) times daily.  60 tablet  5  . topiramate (TOPAMAX) 200 MG tablet Take 350 mg by mouth at bedtime. Take 1.5 by mouth at bedtime      . traZODone (DESYREL) 50 MG tablet TAKE 1-2 TABLETS (50-100 MG TOTAL) BY MOUTH AT BEDTIME.  50 tablet  5  . vancomycin (VANCOCIN) 125 MG capsule Take 1 capsule (125 mg total) by mouth 4 (four) times daily.  56 capsule  0   No facility-administered medications prior to visit.   Allergies  Allergen Reactions  . Dextromethorphan-Guaifenesin Nausea And Vomiting  . Morphine Nausea And Vomiting  . Penicillins Rash    "> 30 years ago; best I can remember it was just a light rash on my arm"   Patient Active Problem List   Diagnosis Date Noted  . C. difficile diarrhea 08/01/2012  .  Urinary frequency 05/29/2012  . Medicare annual wellness visit, subsequent 06/14/2011  . CHEST PAIN 01/14/2010  . DIZZINESS 09/29/2009  . CONSTIPATION 04/01/2009  . PARESTHESIA 04/01/2009  . DJD (degenerative joint disease) 10/21/2008  . BARRETTS ESOPHAGUS 07/19/2007  . PERSISTENT DISORDER INITIATING/MAINTAINING SLEEP 05/11/2007  . RESTLESS LEGS SYNDROME 05/11/2007  . MENOPAUSE, SURGICAL 12/13/2006  . HYPERLIPIDEMIA 07/27/2006  . MIGRAINE HEADACHE 07/27/2006  . HYPERTENSION 07/27/2006  . OSTEOPOROSIS 07/27/2006  . FATIGUE 07/27/2006   History  Substance Use Topics  . Smoking status: Former Smoker -- 0.50 packs/day for 10 years    Quit date: 02/09/1975  . Smokeless tobacco: Never Used     Comment: started at age 35.   quit in the 1970s.  . Alcohol Use: Yes     Comment: 10/05/2011 "socially;  wine or mixed drink at the most once/month"   family history includes Breast cancer in her sister; Colon cancer in her maternal grandfather and maternal uncle; Dementia in her father; Diabetes in an unspecified family member; Esophageal cancer in her brother; and Lung cancer in her mother.  There is no history of Rectal cancer and Stomach cancer.  Objective:   Physical Exam well-developed older white female in no acute distress, pleasant blood pressure 110/70 pulse 72 height 5 foot 4 weight 224. HEENT; nontraumatic normocephalic EOMI PERRLA sclera anicteric, Supple; no JVD, Cardiovascular; regular rate and rhythm with S1-S2 no murmur or gallop, Pulmonary; clear bilaterally, Abdomen; large soft is mild rather diffuse tenderness no guarding or rebound no palpable mass or hepatosplenomegaly bowel sounds are active to hyperactive, Rectal; exam not done, Extremities; no clubbing cyanosis or edema skin warm and dry, Psych; mood and affect normal and appropriate.        Assessment & Plan:  #62 67 year old female with relapsing C. difficile colitis. Patient initially treated with a course of Flagyl with  resolution then had a course of vancomycin but required on current treatment with Bactrim and symptoms relapse began 5 days after completing vancomycin. #2 fatigue secondary to above #3 history of Barrett's esophagus #4 hypertension  Plan; will check stool for C. difficile by PCR today as well as BMET and CBC Start a tapered course of vancomycin. Have recommended 250 mg by mouth 4 times daily x2 weeks then 250 mg by mouth 3 times daily x1 week then 250 mg twice daily x1 week then 250 mg once daily x1 week then 250 mg every other day x one week. Start Florastor  twice daily x1 month Bland diet and push oral fluid Reviewed enteric pre cautions with patient She is advised to call should she have any worsening as of symptoms over the  next few weeks We'll plan to followup in 5-6 week with myself or Dr. Russella Dar

## 2012-09-07 ENCOUNTER — Other Ambulatory Visit: Payer: Medicare Other

## 2012-09-07 DIAGNOSIS — A0472 Enterocolitis due to Clostridium difficile, not specified as recurrent: Secondary | ICD-10-CM

## 2012-09-08 LAB — CLOSTRIDIUM DIFFICILE BY PCR: Toxigenic C. Difficile by PCR: NOT DETECTED

## 2012-09-10 ENCOUNTER — Encounter: Payer: Self-pay | Admitting: Internal Medicine

## 2012-09-11 NOTE — Telephone Encounter (Signed)
Please advise 

## 2012-09-12 MED ORDER — ZOSTER VACCINE LIVE 19400 UNT/0.65ML ~~LOC~~ SOLR: 0.6500 mL | Freq: Once | SUBCUTANEOUS | Status: DC

## 2012-09-12 NOTE — Telephone Encounter (Signed)
Refill reprinted for patient per mychart response [name]. Pt. Made aware new rx ready for pickup

## 2012-09-27 DIAGNOSIS — Z961 Presence of intraocular lens: Secondary | ICD-10-CM | POA: Diagnosis not present

## 2012-09-27 DIAGNOSIS — H40019 Open angle with borderline findings, low risk, unspecified eye: Secondary | ICD-10-CM | POA: Diagnosis not present

## 2012-09-27 DIAGNOSIS — D3190 Benign neoplasm of unspecified part of unspecified eye: Secondary | ICD-10-CM | POA: Diagnosis not present

## 2012-09-27 DIAGNOSIS — H524 Presbyopia: Secondary | ICD-10-CM | POA: Diagnosis not present

## 2012-09-27 DIAGNOSIS — H04129 Dry eye syndrome of unspecified lacrimal gland: Secondary | ICD-10-CM | POA: Diagnosis not present

## 2012-09-27 DIAGNOSIS — H1045 Other chronic allergic conjunctivitis: Secondary | ICD-10-CM | POA: Diagnosis not present

## 2012-10-13 ENCOUNTER — Encounter: Payer: Self-pay | Admitting: Gastroenterology

## 2012-10-13 ENCOUNTER — Ambulatory Visit (INDEPENDENT_AMBULATORY_CARE_PROVIDER_SITE_OTHER): Payer: Medicare Other | Admitting: Gastroenterology

## 2012-10-13 ENCOUNTER — Telehealth: Payer: Self-pay | Admitting: Gastroenterology

## 2012-10-13 VITALS — BP 112/80 | HR 80 | Ht 64.5 in | Wt 226.0 lb

## 2012-10-13 DIAGNOSIS — A0472 Enterocolitis due to Clostridium difficile, not specified as recurrent: Secondary | ICD-10-CM

## 2012-10-13 MED ORDER — SACCHAROMYCES BOULARDII 250 MG PO CAPS: 250.0000 mg | ORAL_CAPSULE | Freq: Two times a day (BID) | ORAL | Status: DC

## 2012-10-13 NOTE — Telephone Encounter (Signed)
Sent the prescription for Florastor to patient's pharmacy and notified patient.

## 2012-10-13 NOTE — Progress Notes (Signed)
History of Present Illness: This is a 67 year old female with C. difficile was treated with metronidazole, then a 2 week course of vancomycin and a prolonged pulse-taper course of vancomycin. She completed her last vancomycin  pill on Tuesday. She continues on [employer]. She notes she has about 4 soft formed bowel movements a day. This is substantially improved from her prior diarrhea. Her typical bowel pattern for the past several years prior to the onset of C. difficile was one bowel movement every other day.   Current Medications, Allergies, Past Medical History, Past Surgical History, Family History and Social History were reviewed in [employer] record.  Physical Exam: General: Well developed , well nourished, no acute distress Head: Normocephalic and atraumatic Eyes:  sclerae anicteric, EOMI Ears: Normal auditory acuity Mouth: No deformity or lesions Lungs: Clear throughout to auscultation Heart: Regular rate and rhythm; no murmurs, rubs or bruits Abdomen: Soft, non tender and non distended. No masses, hepatosplenomegaly or hernias noted. Normal Bowel sounds Musculoskeletal: Symmetrical with no gross deformities  Pulses:  Normal pulses noted Extremities: No clubbing, cyanosis, edema or deformities noted Neurological: Alert oriented x 4, grossly nonfocal Psychological:  Alert and cooperative. Normal mood and affect  Assessment and Recommendations:  1. C. difficile colitis. Resolved following prolonged course of antibiotics as outlined above. Continue Florastor for 3 more weeks. Followup as needed.  2. Barrett's esophagus. Surveillance endoscopy due October 2014. Continue current daily PPI and H2RA.  3 Family history of colon cancer. Screening colonoscopy due October 2016.

## 2012-10-13 NOTE — Patient Instructions (Signed)
Continue Florastor for 3 more weeks then discontinue.   Thank you for choosing me and [facility].  Venita Lick. Pleas Koch., MD., Clementeen Graham

## 2012-10-14 ENCOUNTER — Other Ambulatory Visit: Payer: Self-pay | Admitting: Gastroenterology

## 2012-10-17 LAB — HM MAMMOGRAPHY: HM Mammogram: NORMAL

## 2012-10-18 ENCOUNTER — Telehealth: Payer: Self-pay | Admitting: Gastroenterology

## 2012-10-18 DIAGNOSIS — R197 Diarrhea, unspecified: Secondary | ICD-10-CM

## 2012-10-18 MED ORDER — VANCOMYCIN HCL 250 MG PO CAPS: 250.0000 mg | ORAL_CAPSULE | Freq: Four times a day (QID) | ORAL | Status: DC

## 2012-10-18 NOTE — Telephone Encounter (Signed)
C diff study Resume vancomycin 250 mg po qid for 4 weeks ID consult for further mgmt

## 2012-10-18 NOTE — Telephone Encounter (Signed)
Patient reports that she has some cramping, same odor she had with previous c-diff infection.  She has had 7 watery BM today. She is still on Florastor.    Dr. Russella Dar please advise

## 2012-10-18 NOTE — Telephone Encounter (Signed)
Patient notified She is aware to pick up the vanc, but not start on it until she has collected her stool sample.   She is aware she will be notified about ID appointment as soon as I hear back from them

## 2012-10-19 ENCOUNTER — Other Ambulatory Visit: Payer: Medicare Other

## 2012-10-19 DIAGNOSIS — R197 Diarrhea, unspecified: Secondary | ICD-10-CM

## 2012-10-20 LAB — CLOSTRIDIUM DIFFICILE BY PCR: Toxigenic C. Difficile by PCR: NOT DETECTED

## 2012-10-20 NOTE — Telephone Encounter (Signed)
Patient is scheduled to see Dr. Drue Second with ID on 10/31/12.  The appt was scheduled by the ID clinic with the patient and she is aware

## 2012-11-01 ENCOUNTER — Encounter: Payer: Self-pay | Admitting: Internal Medicine

## 2012-11-01 ENCOUNTER — Ambulatory Visit (INDEPENDENT_AMBULATORY_CARE_PROVIDER_SITE_OTHER): Payer: Medicare Other | Admitting: Internal Medicine

## 2012-11-01 VITALS — BP 150/83 | HR 60 | Temp 97.8°F | Wt 225.0 lb

## 2012-11-01 DIAGNOSIS — R350 Frequency of micturition: Secondary | ICD-10-CM | POA: Diagnosis not present

## 2012-11-01 NOTE — Progress Notes (Signed)
Subjective:    Patient ID: [name], female    DOB: Jun 19, 1945, 67 y.o.   MRN: [id number]  HPI 67 yo F was in good state of health, has had colds where she received 2 courses of antibiotics. However this year, you had ecoli UTI in April when she received cipro. Then started to have diarrhea a few weeks afterwards in May. Noticed having slough in her stool, very frequent watery, foul smelling and blood tinged.+ very fatigue, poor appetite. lost 10lb Seen by her PCP where she was diagnosed with Cdifficile. She was given a course of flagyl where she responded, felt well.  She then had her 2nd episode in June in setting of feeling poorly but also diagnosed with recurrently ecoli UTI ,give cipro and concurrently diagnosed pcr cdiff+, and this time given vanco with taper plus probiotic  Now end of the July after that long vanco taper she was feeling back to normal and gained 3 lb. She noticed in early September, she had diarrhea 6 or more times per day with abdominal cramping. If she doesn't eat, diarrhea stops. Did get tested for Cdifficile but negative at that time. She was placed back on vanco QID for the past 14 days. She has noticed that her bowel mvmts are somewhat less roughly 4x a day. Really soft stool but not watery. Very gassy. No abdominal cramping no blood. She feels like she has a recurrent uti, with frequent urination.  Had previously colonoscopy has been normal, has had polyps removed, but benign path. Seen by Dr. Russella Dar.   Allergies  Allergen Reactions  . Dextromethorphan-Guaifenesin Nausea And Vomiting  . Morphine Nausea And Vomiting  . Penicillins Rash    "> 30 years ago; best I can remember it was just a light rash on my arm"     Current Outpatient Prescriptions on File Prior to Visit  Medication Sig Dispense Refill  . aspirin EC 81 MG tablet Take 81 mg by mouth daily.      [name] atenolol (TENORMIN) 25 MG tablet Take 1 tablet (25 mg total) by mouth daily.  90 tablet  3  .  benazepril (LOTENSIN) 10 MG tablet Take 20 mg by mouth daily.       . celecoxib (CELEBREX) 200 MG capsule Take 200 mg by mouth as needed for pain.      . cyclobenzaprine (FLEXERIL) 10 MG tablet Take 10 mg by mouth 3 (three) times daily as needed. For migraines.      [name] HYDROcodone-acetaminophen (NORCO) 10-325 MG per tablet Take 1 tablet by mouth every 4 (four) hours as needed for pain. For back pain  60 tablet  0  . NON FORMULARY Allaway eye drops, both eyes daily      . omeprazole (PRILOSEC) 40 MG capsule Take 1 capsule (40 mg total) by mouth 2 (two) times daily.  180 capsule  3  . omeprazole (PRILOSEC) 40 MG capsule TAKE 1 CAPSULE BY MOUTH 2 TIMES A DAY  60 capsule  11  . Polyethyl Glycol-Propyl Glycol (SYSTANE OP) Place 2 drops into both eyes daily as needed. For dry or burning eyes      . pravastatin (PRAVACHOL) 40 MG tablet Take 1 tablet (40 mg total) by mouth daily.  90 tablet  0  . ranitidine (ZANTAC) 300 MG tablet TAKE 1 TABLET (300 MG TOTAL) BY MOUTH AT BEDTIME.  90 tablet  1  . rOPINIRole (REQUIP) 1 MG tablet Take 1 tablet (1 mg total) by mouth 2 (two)  times daily.  60 tablet  5  . saccharomyces boulardii (FLORASTOR) 250 MG capsule Take 1 capsule (250 mg total) by mouth 2 (two) times daily.  60 capsule  0  . topiramate (TOPAMAX) 200 MG tablet Take 350 mg by mouth at bedtime. Take 1.5 by mouth at bedtime      . traZODone (DESYREL) 50 MG tablet TAKE 1-2 TABLETS (50-100 MG TOTAL) BY MOUTH AT BEDTIME.  50 tablet  5  . vancomycin (VANCOCIN) 250 MG capsule Take 1 capsule (250 mg total) by mouth 4 (four) times daily.  120 capsule  0  . zoster vaccine live, PF, (ZOSTAVAX) 16109 UNT/0.65ML injection Inject 19,400 Units into the skin once.  1 each  0   No current facility-administered medications on file prior to visit.   Active Ambulatory Problems    Diagnosis Date Noted  . HYPERLIPIDEMIA 07/27/2006  . PERSISTENT DISORDER INITIATING/MAINTAINING SLEEP 05/11/2007  . RESTLESS LEGS SYNDROME  05/11/2007  . MIGRAINE HEADACHE 07/27/2006  . HYPERTENSION 07/27/2006  . BARRETTS ESOPHAGUS 07/19/2007  . CONSTIPATION 04/01/2009  . MENOPAUSE, SURGICAL 12/13/2006  . DJD (degenerative joint disease) 10/21/2008  . OSTEOPOROSIS 07/27/2006  . DIZZINESS 09/29/2009  . FATIGUE 07/27/2006  . PARESTHESIA 04/01/2009  . CHEST PAIN 01/14/2010  . Medicare annual wellness visit, subsequent 06/14/2011  . Urinary frequency 05/29/2012  . C. difficile diarrhea 08/01/2012   Resolved Ambulatory Problems    Diagnosis Date Noted  . CHANGE IN BOWELS 11/03/2009  . Abdominal pain, generalized 03/07/2007  . Acute blood loss anemia 10/07/2011  . Diarrhea 11/26/2011   Past Medical History  Diagnosis Date  . Hyperlipidemia   . Osteoporosis   . Hiatal hernia   . Depressive disorder, not elsewhere classified   . Esophageal reflux   . Back pain   . RLS (restless legs syndrome)   . Complication of anesthesia   . PONV (postoperative nausea and vomiting)   . Family history of anesthesia complication   . Shortness of breath   . Migraine   . Arthritis   . Chronic lower back pain   . Chronic diarrhea    History  Substance Use Topics  . Smoking status: Former Smoker -- 0.50 packs/day for 10 years    Quit date: 02/09/1975  . Smokeless tobacco: Never Used     Comment: started at age 67.   quit in the 1970s.  . Alcohol Use: Yes     Comment: 10/05/2011 "socially;  wine or mixed drink at the most once/month"  family history includes Breast cancer in her sister; Colon cancer in her maternal grandfather and maternal uncle; Dementia in her father; Diabetes in an other family member; Esophageal cancer in her brother; Lung cancer in her mother. There is no history of Rectal cancer or Stomach cancer. No family hx of celiac disease  Review of Systems No fever, chills, decreased appetite,  Loose stool, wt loss at 7 lb from baseline    Objective:   Physical Exam BP 150/83  Pulse 60  Temp(Src) 97.8 F (36.6  C) (Oral)  Wt 225 lb (102.059 kg)  BMI 38.04 kg/m2 Physical Exam  Constitutional:  oriented to person, place, and time.appears well-developed and well-nourished. No distress.  HENT:  Mouth/Throat: Oropharynx is clear and moist. No oropharyngeal exudate.  Cardiovascular: Normal rate, regular rhythm and normal heart sounds. Exam reveals no gallop and no friction rub.  No murmur heard.  Pulmonary/Chest: Effort normal and breath sounds normal. No respiratory distress. He has no wheezes.  Abdominal:  Soft. Bowel sounds are normal. exhibits no distension. There is no tenderness.  Lymphadenopathy: no cervical adenopathy.         Assessment & Plan:   recurrent cdifficile = if worsens, will refer to dr. Leone Payor for FMT referral  For vanco taper to TID x 7 days, to BID to we see her next. Continue florastor  Recurrent uti = will check ua nad urine culture. May need uro eval to determine if has anatomical reasons for recurrence. Appears to have symptomatic pyuria and 100,00 GNR growing. We will give fosfomycin 3gm x 3 doses every other day for complicated uti.   [phone] call results of urine test  2-3 wks

## 2012-11-02 LAB — URINALYSIS, ROUTINE W REFLEX MICROSCOPIC
Bilirubin Urine: NEGATIVE
Glucose, UA: NEGATIVE mg/dL
Hgb urine dipstick: NEGATIVE
Nitrite: NEGATIVE
Protein, ur: 30 mg/dL — AB
Specific Gravity, Urine: >1.03 — ABNORMAL HIGH (ref 1.005–1.030)
Urobilinogen, UA: 0.2 mg/dL (ref 0.0–1.0)
pH: 5 (ref 5.0–8.0)

## 2012-11-02 LAB — URINALYSIS, MICROSCOPIC ONLY
Casts: NONE SEEN
Crystals: NONE SEEN

## 2012-11-03 ENCOUNTER — Telehealth: Payer: Self-pay | Admitting: Licensed Clinical Social Worker

## 2012-11-03 DIAGNOSIS — N390 Urinary tract infection, site not specified: Secondary | ICD-10-CM

## 2012-11-03 LAB — URINE CULTURE: Colony Count: >=100000

## 2012-11-03 MED ORDER — FOSFOMYCIN TROMETHAMINE 3 G PO PACK: 3.0000 g | PACK | ORAL | Status: DC

## 2012-11-03 NOTE — Telephone Encounter (Signed)
Patient called inquiring about results of urinalysis, results given, per Dr. Drue Second patient to get Fosfomycin 3 gm every other day for 3 days. Patient aware

## 2012-11-04 ENCOUNTER — Other Ambulatory Visit: Payer: Self-pay | Admitting: Pulmonary Disease

## 2012-11-07 NOTE — Telephone Encounter (Signed)
I spoke with [facility] bc pt should still have refills left. I was advised she does and will delete request

## 2012-11-09 DIAGNOSIS — H04129 Dry eye syndrome of unspecified lacrimal gland: Secondary | ICD-10-CM | POA: Diagnosis not present

## 2012-11-09 DIAGNOSIS — H40019 Open angle with borderline findings, low risk, unspecified eye: Secondary | ICD-10-CM | POA: Diagnosis not present

## 2012-11-13 DIAGNOSIS — Z1212 Encounter for screening for malignant neoplasm of rectum: Secondary | ICD-10-CM | POA: Diagnosis not present

## 2012-11-13 DIAGNOSIS — M810 Age-related osteoporosis without current pathological fracture: Secondary | ICD-10-CM | POA: Diagnosis not present

## 2012-11-13 DIAGNOSIS — Z1231 Encounter for screening mammogram for malignant neoplasm of breast: Secondary | ICD-10-CM | POA: Diagnosis not present

## 2012-11-13 DIAGNOSIS — N951 Menopausal and female climacteric states: Secondary | ICD-10-CM | POA: Diagnosis not present

## 2012-11-14 ENCOUNTER — Encounter: Payer: Self-pay | Admitting: Internal Medicine

## 2012-11-14 ENCOUNTER — Ambulatory Visit (INDEPENDENT_AMBULATORY_CARE_PROVIDER_SITE_OTHER): Payer: Medicare Other | Admitting: Internal Medicine

## 2012-11-14 VITALS — BP 122/69 | HR 69 | Temp 98.3°F | Wt 225.0 lb

## 2012-11-14 DIAGNOSIS — R197 Diarrhea, unspecified: Secondary | ICD-10-CM

## 2012-11-14 NOTE — Progress Notes (Signed)
[facility] NOTE  RFV: chronic diarrhea, cdiff Subjective:    Patient ID: [name], female    DOB: Dec 26, 1945, 67 y.o.   MRN: [id number]  HPI Recurrent cdiff vs. Post infectious diarrhea. Having still frequent bm despite BID vanco. Her son thinks it has increased. Most often occuring after she is eating, although delayed. She notices having diarrhea 24hrs after a day of full meals. She is feeling fatigue. She describes her stools as loose, foul smelling. No abodminal cramping or blood, but just dark.  Current Outpatient Prescriptions on File Prior to Visit  Medication Sig Dispense Refill  . aspirin EC 81 MG tablet Take 81 mg by mouth daily.      [name] atenolol (TENORMIN) 25 MG tablet Take 1 tablet (25 mg total) by mouth daily.  90 tablet  3  . benazepril (LOTENSIN) 10 MG tablet Take 20 mg by mouth daily.       . celecoxib (CELEBREX) 200 MG capsule Take 200 mg by mouth as needed for pain.      . cyclobenzaprine (FLEXERIL) 10 MG tablet Take 10 mg by mouth 3 (three) times daily as needed. For migraines.      . fosfomycin (MONUROL) 3 G PACK Take 3 g by mouth every other day.  1 g  0  . HYDROcodone-acetaminophen (NORCO) 10-325 MG per tablet Take 1 tablet by mouth every 4 (four) hours as needed for pain. For back pain  60 tablet  0  . NON FORMULARY [place]away eye drops, both eyes daily      . omeprazole (PRILOSEC) 40 MG capsule TAKE 1 CAPSULE BY MOUTH 2 TIMES A DAY  60 capsule  11  . Polyethyl Glycol-Propyl Glycol (SYSTANE OP) Place 2 drops into both eyes daily as needed. For dry or burning eyes      . pravastatin (PRAVACHOL) 40 MG tablet Take 1 tablet (40 mg total) by mouth daily.  90 tablet  0  . ranitidine (ZANTAC) 300 MG tablet TAKE 1 TABLET (300 MG TOTAL) BY MOUTH AT BEDTIME.  90 tablet  1  . rOPINIRole (REQUIP) 1 MG tablet Take 1 tablet (1 mg total) by mouth 2 (two) times daily.  60 tablet  5  . saccharomyces boulardii (FLORASTOR) 250 MG capsule Take 1 capsule (250 mg total) by mouth 2 (two)  times daily.  60 capsule  0  . topiramate (TOPAMAX) 200 MG tablet Take 350 mg by mouth at bedtime. Take 1.5 by mouth at bedtime      . traZODone (DESYREL) 50 MG tablet TAKE 1-2 TABLETS (50-100 MG TOTAL) BY MOUTH AT BEDTIME.  50 tablet  5  . vancomycin (VANCOCIN) 250 MG capsule Take 1 capsule (250 mg total) by mouth 4 (four) times daily.  120 capsule  0  . zoster vaccine live, PF, (ZOSTAVAX) 40981 UNT/0.65ML injection Inject 19,400 Units into the skin once.  1 each  0   No current facility-administered medications on file prior to visit.   Active Ambulatory Problems    Diagnosis Date Noted  . HYPERLIPIDEMIA 07/27/2006  . PERSISTENT DISORDER INITIATING/MAINTAINING SLEEP 05/11/2007  . RESTLESS LEGS SYNDROME 05/11/2007  . MIGRAINE HEADACHE 07/27/2006  . HYPERTENSION 07/27/2006  . BARRETTS ESOPHAGUS 07/19/2007  . CONSTIPATION 04/01/2009  . MENOPAUSE, SURGICAL 12/13/2006  . DJD (degenerative joint disease) 10/21/2008  . OSTEOPOROSIS 07/27/2006  . DIZZINESS 09/29/2009  . FATIGUE 07/27/2006  . PARESTHESIA 04/01/2009  . CHEST PAIN 01/14/2010  . Medicare annual wellness visit, subsequent 06/14/2011  . Urinary frequency  05/29/2012  . C. difficile diarrhea 08/01/2012   Resolved Ambulatory Problems    Diagnosis Date Noted  . CHANGE IN BOWELS 11/03/2009  . Abdominal pain, generalized 03/07/2007  . Acute blood loss anemia 10/07/2011  . Diarrhea 11/26/2011   Past Medical History  Diagnosis Date  . Hyperlipidemia   . Osteoporosis   . Hiatal hernia   . Depressive disorder, not elsewhere classified   . Esophageal reflux   . Back pain   . RLS (restless legs syndrome)   . Complication of anesthesia   . PONV (postoperative nausea and vomiting)   . Family history of anesthesia complication   . Shortness of breath   . Migraine   . Arthritis   . Chronic lower back pain   . Chronic diarrhea       Review of Systems 12 point ROS is negative other than diarrhea,fatigue, poor po intake due  to concerns for recurrent diarrhea    Objective:   Physical Exam BP 122/69  Pulse 69  Temp(Src) 98.3 F (36.8 C) (Oral)  Wt 225 lb (102.059 kg)  BMI 38.04 kg/m2 gen = fatigue appearing, a x o by 3.       Assessment & Plan:  Chronic diarrhea = will finish out her vanco taper by taking daily x 5-7 days. Her last cdifficile testing in September was negative. I suspect she might have element of post infectious diarrhea from original cdifficile infection. Will check celiac disease serum markers. Also will recheck her cdiff pcr assay if she has recurrent diarrhea off of vancomycin. Will refer to Dr. Leone Payor to see if he agrees with evaluation and see if needs to have CSY for evaluation of chronic diarrhea.

## 2012-11-15 ENCOUNTER — Other Ambulatory Visit: Payer: Self-pay | Admitting: Orthopedic Surgery

## 2012-11-15 DIAGNOSIS — M545 Low back pain, unspecified: Secondary | ICD-10-CM

## 2012-11-15 LAB — GLIADIN ANTIBODIES, SERUM
Gliadin IgA: 3.8 U/mL (ref <20)
Gliadin IgG: 20.5 U/mL — ABNORMAL HIGH (ref <20)

## 2012-11-15 LAB — RETICULIN ANTIBODIES, IGA W TITER: Reticulin Ab, IgA: NEGATIVE

## 2012-11-15 LAB — TISSUE TRANSGLUTAMINASE, IGA: Tissue Transglutaminase Ab, IgA: 6.3 U/mL (ref <20)

## 2012-11-21 ENCOUNTER — Ambulatory Visit
Admission: RE | Admit: 2012-11-21 | Discharge: 2012-11-21 | Disposition: A | Discharge status: Home / Self Care | Payer: Worker's Compensation | Source: Ambulatory Visit | Attending: Orthopedic Surgery | Admitting: Orthopedic Surgery

## 2012-11-21 ENCOUNTER — Telehealth: Payer: Self-pay | Admitting: Licensed Clinical Social Worker

## 2012-11-21 ENCOUNTER — Ambulatory Visit: Payer: Medicare Other | Admitting: Internal Medicine

## 2012-11-21 DIAGNOSIS — M545 Low back pain, unspecified: Secondary | ICD-10-CM

## 2012-11-21 NOTE — Telephone Encounter (Signed)
Patient saw her labs on Mychart and would like an explanation on the results. I will forward this to Dr. Drue Second.

## 2012-12-14 ENCOUNTER — Telehealth: Payer: Self-pay | Admitting: *Deleted

## 2012-12-14 ENCOUNTER — Telehealth: Payer: Self-pay | Admitting: Internal Medicine

## 2012-12-14 ENCOUNTER — Other Ambulatory Visit: Payer: Self-pay

## 2012-12-14 NOTE — Telephone Encounter (Signed)
Received phone call from patient stating that she is sure that she has a UTI (dysuria, lower abdominal pain, frequency) and would like to know if an antibiotic can be called in for her. She has an appt with you on 12/29/2012. Please advise.

## 2012-12-14 NOTE — Telephone Encounter (Signed)
Pt scheduled for tomorrow. DJR  

## 2012-12-14 NOTE — Telephone Encounter (Signed)
Done pt has been scheduled for visit. DJR

## 2012-12-14 NOTE — Telephone Encounter (Signed)
See previous phone 

## 2012-12-14 NOTE — Telephone Encounter (Signed)
Patient states that she has a UTI and is asking for a prescription for it. She does not want to come in for an OV because she states that "I already know its a UTI, I've had 4 of them." Patient states that she has left 2 VM's on the triage line this week with no response.  Please advise.

## 2012-12-14 NOTE — Telephone Encounter (Addendum)
She has a history of C. difficile diarrhea thus empiric antibiotics are not appropriate, I encourage office visit because treatment is based on urinalysis and symptoms. Please arrange a visit I can see her tomorrow.

## 2012-12-15 ENCOUNTER — Encounter: Payer: Self-pay | Admitting: Internal Medicine

## 2012-12-15 ENCOUNTER — Ambulatory Visit (INDEPENDENT_AMBULATORY_CARE_PROVIDER_SITE_OTHER): Payer: Medicare Other | Admitting: Internal Medicine

## 2012-12-15 VITALS — BP 118/81 | HR 71 | Temp 97.9°F | Wt 226.0 lb

## 2012-12-15 DIAGNOSIS — N390 Urinary tract infection, site not specified: Secondary | ICD-10-CM

## 2012-12-15 DIAGNOSIS — R350 Frequency of micturition: Secondary | ICD-10-CM

## 2012-12-15 LAB — POCT URINALYSIS DIPSTICK
Bilirubin, UA: NEGATIVE
Glucose, UA: NEGATIVE
Ketones, UA: NEGATIVE
Nitrite, UA: NEGATIVE
Spec Grav, UA: 1.025
Urobilinogen, UA: 0.2
pH, UA: 6

## 2012-12-15 MED ORDER — FOSFOMYCIN TROMETHAMINE 3 G PO PACK: 3.0000 g | PACK | ORAL | Status: DC

## 2012-12-15 MED ORDER — PHENAZOPYRIDINE HCL 200 MG PO TABS: 200.0000 mg | ORAL_TABLET | Freq: Three times a day (TID) | ORAL | Status: DC | PRN

## 2012-12-15 NOTE — Assessment & Plan Note (Addendum)
Patient presents with  3 documented Escherichia coli UTIs this year, she has a history of hysterectomy and oophorectomy years ago, Has occasional urinary incontinence. Reports a normal pelvic exam by her gynecologist last month. She is quite symptomatic and a slightly tender in the lower abdomen. No history of previous frequent or recurrent UTIs. History of recent problems with C diff diarrhea  Plan: Urology referral Repeat fosfomycin Azo standard

## 2012-12-15 NOTE — Progress Notes (Signed)
  Subjective:    Patient ID: [name], female    DOB: 1945/06/07, 67 y.o.   MRN: [id number]  HPI Acute visit Patient was seen at the ID clinic few weeks ago and diagnosed with a UTI, was Rx antibiotics and felt well. Symptoms resurface 5 days ago: As usual she is having difficulty urinating and severe urinary frequency, she tried  OTCs w/o help  Past Medical History:   Hyperlipidemia   Hypertension   Osteoporosis   DJD   MIGRAINE HEADACHE - topamax   Depression   GI  --GERD, GASTRITIS, ESOPHAGITIS, BARRETTS ESOPHAGUS no dysplasia   --EGD 3-11, EGD again 10-11+ Barrett's   --H H Insomnia RLS   MENOPAUSE, SURGICAL   (-) cath 12-2002   01-2010 chest pain, negative stress test, echo EF 55%. See report for other minor abnormalities. CKs slightly elevated w/ normal troponins . eventually had a cardiac catheterization: Negative   Past Surgical History:   Hysterectomy for endometriosis , Oophorectomy   L5-S1 anterior fusion   Bilateral knee arthroscopies   Right-shoulder surgery   Left-arm surgery   Bilateral foot surgeries   09-2011 R TKR  Social History:   divorced, has one son who lives w/ her Tobacco--quit in the 65s Alcohol Use -yes,occasional   Occupation-- retired, post office   Family History: colon ca-- maternal GF, 2 maternal uncles breast ca--sister bladder ca-- mother   Esophageal Cancer: Brother lung ca-- M "spleen ca" -- uncle Diabetes--  Aunt, uncle CAD--> CHF F mini strokes--F dementia-- F   Review of Systems Denies any vaginal discharge, vaginal bleeding or dryness. No vaginal rash Saw her gynecologist last month and had an  unremarkable pelvic exam. No fever or chills No flank or abdominal pain. Mild nausea but no vomiting. As far as the C. Difficile diarrhea, she still has occasional diarrhea but symptoms are improving.     Objective:   Physical Exam BP 118/81  Pulse 71  Temp(Src) 97.9 F (36.6 C)  Wt 226 lb (102.513 kg)  SpO2  99% General -- alert, well-developed, NAD.   Abdomen-- Not distended, good bowel sounds,soft, + tender w/o rebound or rigidity at the lower abdomen bilaterally.  No CVA tenderness  Neurologic--  alert & oriented X3. Speech normal, gait normal, strength normal in all extremities.  Psych-- Cognition and judgment appear intact. Cooperative with normal attention span and concentration. No anxious appearing , no depressed appearing.      Assessment & Plan:

## 2012-12-15 NOTE — Patient Instructions (Signed)
Take antibiotics as prescribed Take pyridium for symptom relief We are referring you to urology Let us know if your symptoms are not improving or if you develop more diarrhea.

## 2012-12-15 NOTE — Progress Notes (Signed)
Pre visit review using our clinic review tool, if applicable. No additional management support is needed unless otherwise documented below in the visit note. 

## 2012-12-16 LAB — URINALYSIS, ROUTINE W REFLEX MICROSCOPIC
Bilirubin Urine: NEGATIVE
Glucose, UA: NEGATIVE mg/dL
Ketones, ur: NEGATIVE mg/dL
Nitrite: NEGATIVE
Protein, ur: NEGATIVE mg/dL
Specific Gravity, Urine: 1.024 (ref 1.005–1.030)
Urobilinogen, UA: 0.2 mg/dL (ref 0.0–1.0)
pH: 5.5 (ref 5.0–8.0)

## 2012-12-16 LAB — URINALYSIS, MICROSCOPIC ONLY
Bacteria, UA: NONE SEEN
Casts: NONE SEEN
Crystals: NONE SEEN
Squamous Epithelial / LPF: NONE SEEN
WBC, UA: >50 WBC/hpf — AB (ref <3)

## 2012-12-17 LAB — URINE CULTURE: Colony Count: 7000

## 2012-12-18 DIAGNOSIS — G43019 Migraine without aura, intractable, without status migrainosus: Secondary | ICD-10-CM | POA: Diagnosis not present

## 2012-12-21 ENCOUNTER — Encounter: Payer: Self-pay | Admitting: Internal Medicine

## 2012-12-21 ENCOUNTER — Telehealth: Payer: Self-pay | Admitting: *Deleted

## 2012-12-21 NOTE — Telephone Encounter (Signed)
Called and spoke with patient to let her know that samples are ready for pickup at our front desk

## 2012-12-21 NOTE — Telephone Encounter (Signed)
Notified pt of lab results. Pt finished antibiotics, states still having frequent urination and has a apt. with her urologist Monday. Also requesting a new medication to help with the frequent urination. DJR

## 2012-12-21 NOTE — Telephone Encounter (Signed)
Keep appointment with urology Samples of Myrbetriq 25 mg 1 po qd #14

## 2012-12-21 NOTE — Telephone Encounter (Signed)
Returned patient's call concerning urine culture results. Patient stated that she has an appt with Urology on Monday. She stated she finished the antibiotic for UTI on Tuesday and is still having urinary frequency.

## 2012-12-21 NOTE — Telephone Encounter (Signed)
Message copied by Eustace Quail on Thu Dec 21, 2012 10:33 AM ------      Message from: Willow Ora E      Created: Tue Dec 19, 2012  3:13 PM       Advise patient, urine culture did not show significant infection, she does not need to continue taking antibiotics.Does need to see urology. ------

## 2012-12-21 NOTE — Telephone Encounter (Signed)
See next phone note.

## 2012-12-25 DIAGNOSIS — N3941 Urge incontinence: Secondary | ICD-10-CM | POA: Diagnosis not present

## 2012-12-25 DIAGNOSIS — N3020 Other chronic cystitis without hematuria: Secondary | ICD-10-CM | POA: Diagnosis not present

## 2012-12-29 ENCOUNTER — Ambulatory Visit: Payer: Medicare Other | Admitting: Internal Medicine

## 2013-01-02 ENCOUNTER — Other Ambulatory Visit: Payer: Self-pay | Admitting: Internal Medicine

## 2013-01-02 ENCOUNTER — Other Ambulatory Visit: Payer: Self-pay | Admitting: Gastroenterology

## 2013-01-02 NOTE — Telephone Encounter (Signed)
Benazepril refilled per protocol

## 2013-01-06 ENCOUNTER — Other Ambulatory Visit: Payer: Self-pay | Admitting: Gastroenterology

## 2013-01-06 ENCOUNTER — Other Ambulatory Visit: Payer: Self-pay | Admitting: Pulmonary Disease

## 2013-01-16 ENCOUNTER — Ambulatory Visit (INDEPENDENT_AMBULATORY_CARE_PROVIDER_SITE_OTHER): Payer: Medicare Other | Admitting: Internal Medicine

## 2013-01-16 ENCOUNTER — Encounter: Payer: Self-pay | Admitting: Internal Medicine

## 2013-01-16 VITALS — BP 117/77 | HR 85 | Temp 98.2°F | Wt 227.0 lb

## 2013-01-16 DIAGNOSIS — R209 Unspecified disturbances of skin sensation: Secondary | ICD-10-CM | POA: Diagnosis not present

## 2013-01-16 DIAGNOSIS — N3020 Other chronic cystitis without hematuria: Secondary | ICD-10-CM

## 2013-01-16 LAB — HEMOGLOBIN A1C: Hgb A1c MFr Bld: 5.6 % (ref 4.6–6.5)

## 2013-01-16 LAB — VITAMIN B12: Vitamin B-12: 140 pg/mL — ABNORMAL LOW (ref 211–911)

## 2013-01-16 LAB — FOLATE: Folate: 17.1 ng/mL (ref >5.9)

## 2013-01-16 NOTE — Assessment & Plan Note (Addendum)
She is much improved, saw urology, diagnosed with chronic cystitis, was recommended to continue with myrbetriq, Has a followup with urology this week

## 2013-01-16 NOTE — Progress Notes (Signed)
   Subjective:    Patient ID: [name], female    DOB: 06-21-1945, 67 y.o.   MRN: [id number]  HPI Followup from previous visit Diarrhea, C. Difficile--much improved, did not get to see GI Recurrent UTIs--saw urology, better, see assessment and plan. Also reports 2- 3 months history of tingling of her lower extremity, the feeling involves both feet. Denies any burning and numbness type of feeling " is just tingling". She has chronic back pain, denies any bowel incontinence, she did have urinary incontinence but that is resolved.   Past Medical History:   Hyperlipidemia   Hypertension   Osteoporosis   DJD   MIGRAINE HEADACHE - topamax   Depression   GI  --GERD, GASTRITIS, ESOPHAGITIS, BARRETTS ESOPHAGUS no dysplasia   --EGD 3-11, EGD again 10-11+ Barrett's   --H H Insomnia RLS   MENOPAUSE, SURGICAL   (-) cath 12-2002   01-2010 chest pain, negative stress test, echo EF 55%. See report for other minor abnormalities. CKs slightly elevated w/ normal troponins . eventually had a cardiac catheterization: Negative   Past Surgical History:   Hysterectomy for endometriosis , Oophorectomy   L5-S1 anterior fusion   Bilateral knee arthroscopies   Right-shoulder surgery   Left-arm surgery   Bilateral foot surgeries   09-2011 R TKR  Social History:   divorced, has one son who lives w/ her Tobacco--quit in the 69s Alcohol Use -yes,occasional   Occupation-- retired, post office   Family History: colon ca-- maternal GF, 2 maternal uncles breast ca--sister bladder ca-- mother   Esophageal Cancer: Brother lung ca-- M "spleen ca" -- uncle Diabetes--  Aunt, uncle CAD--> CHF F mini strokes--F dementia-- F   Review of Systems See HPI    Objective:   Physical Exam  BP 117/77  Pulse 85  Temp(Src) 98.2 F (36.8 C)  Wt 227 lb (102.967 kg)  SpO2 100% General -- alert, well-developed, NAD.   Extremities-- no pretibial edema bilaterally , no rash; + Pedal pulses bilaterally,  good capillary refill on all toes Neurologic--  alert & oriented X3. Speech normal, gait normal, strength normal in all extremities.  DTRs LE: Symmetric except for the left knee jerk that seems more intense than the rest of the DTRs. Pinprick examination of the feet show a very subtle patchy decrease. Psych-- Cognition and judgment appear intact. Cooperative with normal attention span and concentration. No anxious appearing , no depressed appearing.      Assessment & Plan:

## 2013-01-16 NOTE — Patient Instructions (Signed)
Get your blood work before you leave  Needs a A1c, B12 and folic acid------ DX paresthesias  Next visit for a   follow up   no fasting, in 2-3 months  Please make an appointment

## 2013-01-16 NOTE — Assessment & Plan Note (Addendum)
2 months history of paresthesias as described, she has a history of B12 deficiency and has not been taking supplements lately. Neuropathy?. TSH has been consistently normal. Also she has a history of chronic back pain, that's not a new issue. Plan: Check A1c, B12, VDRL and folic acid . Treat base on results  Consider NCS

## 2013-01-16 NOTE — Progress Notes (Signed)
Pre visit review using our clinic review tool, if applicable. No additional management support is needed unless otherwise documented below in the visit note. 

## 2013-01-17 LAB — RPR

## 2013-01-19 ENCOUNTER — Telehealth: Payer: Self-pay | Admitting: Internal Medicine

## 2013-01-19 DIAGNOSIS — N3020 Other chronic cystitis without hematuria: Secondary | ICD-10-CM | POA: Diagnosis not present

## 2013-01-19 DIAGNOSIS — E538 Deficiency of other specified B group vitamins: Secondary | ICD-10-CM

## 2013-01-19 DIAGNOSIS — N3941 Urge incontinence: Secondary | ICD-10-CM | POA: Diagnosis not present

## 2013-01-19 MED ORDER — CYANOCOBALAMIN 1000 MCG/ML IJ SOLN: 1000.0000 ug | Freq: Once | INTRAMUSCULAR | Status: DC

## 2013-01-19 NOTE — Telephone Encounter (Signed)
Done. DJR  

## 2013-01-19 NOTE — Telephone Encounter (Signed)
Patient called to return your phone call back regarding labs.

## 2013-01-19 NOTE — Addendum Note (Signed)
Addended by: Eustace Quail on: 01/19/2013 11:53 AM   Modules accepted: Orders

## 2013-01-22 ENCOUNTER — Ambulatory Visit (INDEPENDENT_AMBULATORY_CARE_PROVIDER_SITE_OTHER): Payer: Medicare Other | Admitting: *Deleted

## 2013-01-22 DIAGNOSIS — E538 Deficiency of other specified B group vitamins: Secondary | ICD-10-CM | POA: Diagnosis not present

## 2013-01-22 MED ORDER — CYANOCOBALAMIN 1000 MCG/ML IJ SOLN
1000.0000 ug | Freq: Once | INTRAMUSCULAR | Status: AC
  Administered 2013-01-22: 1000 ug via INTRAMUSCULAR

## 2013-01-25 ENCOUNTER — Other Ambulatory Visit: Payer: Self-pay | Admitting: Internal Medicine

## 2013-01-25 NOTE — Telephone Encounter (Signed)
rx refilled per protocol. DJR  

## 2013-01-29 ENCOUNTER — Ambulatory Visit (INDEPENDENT_AMBULATORY_CARE_PROVIDER_SITE_OTHER): Payer: Medicare Other | Admitting: *Deleted

## 2013-01-29 DIAGNOSIS — E538 Deficiency of other specified B group vitamins: Secondary | ICD-10-CM

## 2013-01-29 MED ORDER — CYANOCOBALAMIN 1000 MCG/ML IJ SOLN
1000.0000 ug | Freq: Once | INTRAMUSCULAR | Status: AC
  Administered 2013-01-29: 1000 ug via INTRAMUSCULAR

## 2013-02-01 ENCOUNTER — Other Ambulatory Visit: Payer: Self-pay | Admitting: Pulmonary Disease

## 2013-02-05 ENCOUNTER — Ambulatory Visit: Payer: Self-pay

## 2013-02-05 ENCOUNTER — Ambulatory Visit (INDEPENDENT_AMBULATORY_CARE_PROVIDER_SITE_OTHER): Payer: Medicare Other | Admitting: *Deleted

## 2013-02-05 DIAGNOSIS — E538 Deficiency of other specified B group vitamins: Secondary | ICD-10-CM

## 2013-02-05 MED ORDER — CYANOCOBALAMIN 1000 MCG/ML IJ SOLN
1000.0000 ug | Freq: Once | INTRAMUSCULAR | Status: AC
  Administered 2013-02-05: 1000 ug via INTRAMUSCULAR

## 2013-02-12 ENCOUNTER — Ambulatory Visit (INDEPENDENT_AMBULATORY_CARE_PROVIDER_SITE_OTHER): Payer: Medicare Other | Admitting: *Deleted

## 2013-02-12 DIAGNOSIS — E538 Deficiency of other specified B group vitamins: Secondary | ICD-10-CM

## 2013-02-12 MED ORDER — CYANOCOBALAMIN 1000 MCG/ML IJ SOLN
1000.0000 ug | Freq: Once | INTRAMUSCULAR | Status: AC
  Administered 2013-02-12: 1000 ug via INTRAMUSCULAR

## 2013-02-28 ENCOUNTER — Encounter: Payer: Self-pay | Admitting: Internal Medicine

## 2013-03-14 ENCOUNTER — Ambulatory Visit (INDEPENDENT_AMBULATORY_CARE_PROVIDER_SITE_OTHER): Payer: Medicare Other | Admitting: *Deleted

## 2013-03-14 DIAGNOSIS — E538 Deficiency of other specified B group vitamins: Secondary | ICD-10-CM | POA: Diagnosis not present

## 2013-03-14 MED ORDER — CYANOCOBALAMIN 1000 MCG/ML IJ SOLN
1000.0000 ug | Freq: Once | INTRAMUSCULAR | Status: AC
Start: 2013-03-14 — End: 2013-03-14
  Administered 2013-03-14: 1000 ug via INTRAMUSCULAR

## 2013-03-28 ENCOUNTER — Encounter: Payer: Self-pay | Admitting: Internal Medicine

## 2013-04-04 ENCOUNTER — Other Ambulatory Visit: Payer: Self-pay | Admitting: Pulmonary Disease

## 2013-04-09 ENCOUNTER — Encounter: Payer: Self-pay | Admitting: Internal Medicine

## 2013-04-09 ENCOUNTER — Ambulatory Visit (INDEPENDENT_AMBULATORY_CARE_PROVIDER_SITE_OTHER): Payer: Medicare Other | Admitting: Internal Medicine

## 2013-04-09 VITALS — BP 130/76 | HR 80 | Temp 98.2°F | Wt 230.0 lb

## 2013-04-09 DIAGNOSIS — A0472 Enterocolitis due to Clostridium difficile, not specified as recurrent: Secondary | ICD-10-CM | POA: Diagnosis not present

## 2013-04-09 DIAGNOSIS — I10 Essential (primary) hypertension: Secondary | ICD-10-CM | POA: Diagnosis not present

## 2013-04-09 DIAGNOSIS — R350 Frequency of micturition: Secondary | ICD-10-CM | POA: Diagnosis not present

## 2013-04-09 DIAGNOSIS — N3020 Other chronic cystitis without hematuria: Secondary | ICD-10-CM

## 2013-04-09 DIAGNOSIS — E538 Deficiency of other specified B group vitamins: Secondary | ICD-10-CM | POA: Insufficient documentation

## 2013-04-09 HISTORY — DX: Deficiency of other specified B group vitamins: E53.8

## 2013-04-09 LAB — BASIC METABOLIC PANEL
BUN: 18 mg/dL (ref 6–23)
CO2: 22 mEq/L (ref 19–32)
Calcium: 8.5 mg/dL (ref 8.4–10.5)
Chloride: 113 mEq/L — ABNORMAL HIGH (ref 96–112)
Creatinine, Ser: 1 mg/dL (ref 0.4–1.2)
GFR: 61.33 mL/min (ref >60.00)
Glucose, Bld: 87 mg/dL (ref 70–99)
Potassium: 4.1 mEq/L (ref 3.5–5.1)
Sodium: 139 mEq/L (ref 135–145)

## 2013-04-09 LAB — URINALYSIS, ROUTINE W REFLEX MICROSCOPIC
Bilirubin Urine: NEGATIVE
Hgb urine dipstick: NEGATIVE
Ketones, ur: NEGATIVE
Nitrite: NEGATIVE
Specific Gravity, Urine: >=1.03 — AB (ref 1.000–1.030)
Total Protein, Urine: NEGATIVE
Urine Glucose: NEGATIVE
Urobilinogen, UA: 0.2 (ref 0.0–1.0)
pH: 5.5 (ref 5.0–8.0)

## 2013-04-09 LAB — POCT URINALYSIS DIPSTICK
Glucose, UA: NEGATIVE
Nitrite, UA: NEGATIVE
Spec Grav, UA: 1.02
Urobilinogen, UA: 0.2
pH, UA: 5

## 2013-04-09 MED ORDER — CYANOCOBALAMIN 1000 MCG/ML IJ SOLN: 1000.0000 ug | Freq: Once | INTRAMUSCULAR | Status: DC

## 2013-04-09 NOTE — Assessment & Plan Note (Signed)
Due for a B12 shot today

## 2013-04-09 NOTE — Progress Notes (Signed)
Pre visit review using our clinic review tool, if applicable. No additional management support is needed unless otherwise documented below in the visit note. 

## 2013-04-09 NOTE — Assessment & Plan Note (Addendum)
Urinary frequency resurfaced 2 weeks ago, Udip consistent with a UTI however symptoms may be related to chronic cystitis as she has been out of Mybertriq and just got a supply. She has a history of C. Difficile diarrhea consequently we'll get a urine culture, treat w/  with antibiotics if indicated

## 2013-04-09 NOTE — Assessment & Plan Note (Signed)
No diarrhea since the last time she was here

## 2013-04-09 NOTE — Assessment & Plan Note (Signed)
Seems under good control, due for a BMP. Continue same medications

## 2013-04-09 NOTE — Progress Notes (Signed)
Subjective:    Patient ID: [name], female    DOB: 07/09/1945, 68 y.o.   MRN: [id number]  DOS:  04/09/2013 Type of  Visit:  ROV The following issues were discussed   Generalized itching for the last 4-6 weeks, related to dry skin? UTI? Started with urinary frequency sometimes intense, on and off for 2 weeks Hypertension, good medication compliance, BP today very good, no apparent side effects. Due for a B12 shot   ROS  Denies fever or chills. No rash. Denies nausea, vomiting, diarrhea. No abdominal or flank pain. No gross hematuria, some dysuria.    Past Medical History  Diagnosis Date  . Hyperlipidemia   . Unspecified essential hypertension   . Osteoporosis   . Hiatal hernia   . Depression   . Esophageal reflux   . Insomnia   . Barrett's esophagus   . RLS (restless legs syndrome)   . Family history of anesthesia complication     Mother had severe N/V  . Migraine   . DJD (degenerative joint disease)     bilateral hands; knees  . Chronic lower back pain   . Chronic diarrhea   . H/O cardiac catheterization     (-) cath 12-2002  , cath again 2011 (-)  . Vitamin B 12 deficiency 04/09/2013    Past Surgical History  Procedure Laterality Date  . Vaginal hysterectomy      for endometriosis  . Oophorectomy    . Bilateral knee arthroscopy    . Foot surgery      Bilateral; "toenails removed; callus removed on right; hammertoes right"  . Arm surgery      Left; "don't remember what they did; arm wasn't broken"  . Colonoscopy w/ polypectomy    . Cardiac catheterization  01/22/10    clean cath  . Shoulder arthroscopy      right  . Lumbar disc surgery      L5 S1 anterior fusion  . Cataract extraction w/ intraocular lens  implant, bilateral    . Knot      "removed from right neck; not a goiter"  . Total knee arthroplasty  10/05/2011    Procedure: TOTAL KNEE ARTHROPLASTY;  Surgeon: Hessie Dibble, MD;  Location: [facility];  Service: Orthopedics;  Laterality: Right;     History   Social History  . Marital Status: Divorced    Spouse Name: N/A    Number of Children: 1  . Years of Education: N/A   Occupational History  . Retired, post office     Social History Main Topics  . Smoking status: Former Smoker -- 0.50 packs/day for 10 years    Quit date: 02/09/1975  . Smokeless tobacco: Never Used     Comment: started at age 67.   quit in the 1970s.  . Alcohol Use: No     Comment: 10/05/2011 "socially;  wine or mixed drink at the most once/month"  . Drug Use: No  . Sexual Activity: Yes   Other Topics Concern  . Not on file   Social History Narrative  . No narrative on file        Medication List       This list is accurate as of: 04/09/13  5:21 PM.  Always use your most recent med list.               aspirin EC 81 MG tablet  Take 81 mg by mouth daily.     atenolol 25  MG tablet  Commonly known as:  TENORMIN  TAKE 1 TABLET (25 MG TOTAL) BY MOUTH DAILY.     benazepril 20 MG tablet  Commonly known as:  LOTENSIN  TAKE 1 TABLET (20 MG TOTAL) BY MOUTH DAILY.     celecoxib 200 MG capsule  Commonly known as:  CELEBREX  Take 200 mg by mouth as needed for pain.     cyanocobalamin 1000 MCG/ML injection  Commonly known as:  (VITAMIN B-12)  Inject 1 mL (1,000 mcg total) into the muscle once.     cyclobenzaprine 10 MG tablet  Commonly known as:  FLEXERIL  Take 10 mg by mouth 3 (three) times daily as needed. For migraines.     diazepam 5 MG tablet  Commonly known as:  VALIUM     HYDROcodone-acetaminophen 10-325 MG per tablet  Commonly known as:  NORCO  Take 1 tablet by mouth every 4 (four) hours as needed for pain. For back pain     MYRBETRIQ 25 MG Tb24 tablet  Generic drug:  mirabegron ER  Take 25 mg by mouth daily.     NON FORMULARY  [place]away eye drops, both eyes daily     nystatin-triamcinolone cream  Commonly known as:  MYCOLOG II     omeprazole 40 MG capsule  Commonly known as:  PRILOSEC  TAKE 1 CAPSULE BY MOUTH 2 TIMES  A DAY     pravastatin 40 MG tablet  Commonly known as:  PRAVACHOL  Take 1 tablet (40 mg total) by mouth daily.     ranitidine 300 MG tablet  Commonly known as:  ZANTAC  TAKE 1 TABLET BY MOUTH AT BEDTIME     rOPINIRole 1 MG tablet  Commonly known as:  REQUIP  TAKE 1 TABLET (1 MG TOTAL) BY MOUTH 2 (TWO) TIMES DAILY.     SYSTANE OP  Place 2 drops into both eyes daily as needed. For dry or burning eyes     topiramate 200 MG tablet  Commonly known as:  TOPAMAX  Take 350 mg by mouth at bedtime. Take 1.5 by mouth at bedtime     traZODone 50 MG tablet  Commonly known as:  DESYREL  TAKE 1-2 TABLETS (50-100 MG TOTAL) BY MOUTH AT BEDTIME.           Objective:   Physical Exam BP 130/76  Pulse 80  Temp(Src) 98.2 F (36.8 C)  Wt 230 lb (104.327 kg)  SpO2 97% General -- alert, well-developed, NAD.  Lungs -- normal respiratory effort, no intercostal retractions, no accessory muscle use, and normal breath sounds.  Heart-- normal rate, regular rhythm, no murmur.  Abdomen-- Not distended, good bowel sounds,soft, non-tender. No CVA tenderness  SKIN-- dry, no rash per se  Extremities-- no pretibial edema bilaterally  Neurologic--  alert & oriented X3. Speech normal, gait normal, strength normal in all extremities.  Psych-- Cognition and judgment appear intact. Cooperative with normal attention span and concentration. No anxious or depressed appearing.      Assessment & Plan:   Dry skin, Generalized itching likely related to dry skin, recommend to use Aveeno and avoid hot showers. Call if not improving

## 2013-04-09 NOTE — Patient Instructions (Addendum)
Get your blood work before you leave   Next visit is for a physical exam by 08-2013, fasting Please make an appointment    Drink plenty of fluids Use Aveeno lotion OTC daily Avoid  hot showers

## 2013-04-10 ENCOUNTER — Telehealth: Payer: Self-pay | Admitting: Internal Medicine

## 2013-04-10 NOTE — Telephone Encounter (Signed)
Relevant patient education assigned to patient using Emmi. ° °

## 2013-04-11 LAB — URINE CULTURE: Colony Count: >=100000

## 2013-04-13 ENCOUNTER — Other Ambulatory Visit: Payer: Self-pay | Admitting: *Deleted

## 2013-04-13 ENCOUNTER — Telehealth: Payer: Self-pay

## 2013-04-13 MED ORDER — SULFAMETHOXAZOLE-TMP DS 800-160 MG PO TABS: 1.0000 | ORAL_TABLET | Freq: Two times a day (BID) | ORAL | Status: DC

## 2013-04-13 NOTE — Telephone Encounter (Addendum)
Patient notified Rx sent.    Message copied by Reino Bellis on Fri Apr 13, 2013  3:18 PM ------      Message from: Kathlene November E      Created: Wed Apr 11, 2013  6:12 PM       Yadriel Kerrigan, please advise patient, she has another UTI.      Plan:      Call Bactrim DS 1 by mouth twice a day #10, no refills.      To try  to prevent diarrhea, she needs to start a probiotic ALIGN OTC ( similar probiotic) one tablet daily for at least one month.      If she develops diarrhea or hers urinary symptoms persist, she needs to let me know ------

## 2013-04-16 ENCOUNTER — Ambulatory Visit: Payer: Self-pay | Admitting: Internal Medicine

## 2013-04-23 ENCOUNTER — Ambulatory Visit (INDEPENDENT_AMBULATORY_CARE_PROVIDER_SITE_OTHER): Payer: Medicare Other | Admitting: Pulmonary Disease

## 2013-04-23 ENCOUNTER — Encounter: Payer: Self-pay | Admitting: Pulmonary Disease

## 2013-04-23 VITALS — BP 108/70 | HR 63 | Temp 98.0°F | Ht 65.0 in | Wt 231.8 lb

## 2013-04-23 DIAGNOSIS — G2581 Restless legs syndrome: Secondary | ICD-10-CM

## 2013-04-23 NOTE — Assessment & Plan Note (Signed)
The patient is doing well from an RLS standpoint with her current dose of Requip. I've asked her to continue on this, and to work aggressively on weight loss. I've also reminded her again good sleep hygiene, and stimulus control therapy.

## 2013-04-23 NOTE — Patient Instructions (Signed)
Continue on requip for your RLS. Work on weight loss Try to not stay in bed more than 30-49min if you wake up and cannot return to sleep followup with me again in one year.

## 2013-04-23 NOTE — Progress Notes (Signed)
   Subjective:    Patient ID: [name], female    DOB: 10/29/1945, 68 y.o.   MRN: [id number]  HPI Patient comes in today for followup of her restless leg syndrome. She is doing very well on Requip, and feels that her symptoms are well-controlled. She is still having issues at times with awakening during the night and staying up for a prolonged period of time. I have discussed stimulus control therapy with her, and asked her not to stay in the bedroom more than 30 or 45 minutes at night if she is not sleeping. She is also asking about getting RLS symptoms in her arms, and although this can happen, it is extremely unusual.   Review of Systems  Constitutional: Negative for fever and unexpected weight change.  HENT: Negative for congestion, dental problem, ear pain, nosebleeds, postnasal drip, rhinorrhea, sinus pressure, sneezing, sore throat and trouble swallowing.   Eyes: Negative for redness and itching.  Respiratory: Negative for cough, chest tightness, shortness of breath and wheezing.   Cardiovascular: Negative for palpitations and leg swelling.  Gastrointestinal: Negative for nausea and vomiting.  Genitourinary: Negative for dysuria.  Musculoskeletal: Negative for joint swelling.  Skin: Negative for rash.  Neurological: Negative for headaches.  Hematological: Does not bruise/bleed easily.  Psychiatric/Behavioral: Negative for dysphoric mood. The patient is not nervous/anxious.        Objective:   Physical Exam Obese female in no acute distress Nose without purulence or discharge noted Neck without lymphadenopathy or thyromegaly Lower extremities with mild edema, no cyanosis Alert and oriented, moves all 4 extremities.       Assessment & Plan:

## 2013-05-09 ENCOUNTER — Ambulatory Visit (INDEPENDENT_AMBULATORY_CARE_PROVIDER_SITE_OTHER): Payer: Medicare Other

## 2013-05-09 DIAGNOSIS — E538 Deficiency of other specified B group vitamins: Secondary | ICD-10-CM

## 2013-05-09 MED ORDER — CYANOCOBALAMIN 1000 MCG/ML IJ SOLN
1000.0000 ug | Freq: Once | INTRAMUSCULAR | Status: AC
  Administered 2013-05-09: 1000 ug via INTRAMUSCULAR

## 2013-05-22 DIAGNOSIS — N3941 Urge incontinence: Secondary | ICD-10-CM | POA: Diagnosis not present

## 2013-05-22 DIAGNOSIS — N3020 Other chronic cystitis without hematuria: Secondary | ICD-10-CM | POA: Diagnosis not present

## 2013-05-26 ENCOUNTER — Other Ambulatory Visit: Payer: Self-pay | Admitting: Gastroenterology

## 2013-05-26 ENCOUNTER — Other Ambulatory Visit: Payer: Self-pay | Admitting: Pulmonary Disease

## 2013-05-30 NOTE — Telephone Encounter (Signed)
No information available

## 2013-06-05 ENCOUNTER — Other Ambulatory Visit: Payer: Self-pay | Admitting: Internal Medicine

## 2013-06-12 ENCOUNTER — Ambulatory Visit (INDEPENDENT_AMBULATORY_CARE_PROVIDER_SITE_OTHER): Payer: Medicare Other

## 2013-06-12 DIAGNOSIS — E538 Deficiency of other specified B group vitamins: Secondary | ICD-10-CM

## 2013-06-12 MED ORDER — CYANOCOBALAMIN 1000 MCG/ML IJ SOLN
1000.0000 ug | Freq: Once | INTRAMUSCULAR | Status: AC
  Administered 2013-06-12: 1000 ug via INTRAMUSCULAR

## 2013-06-20 ENCOUNTER — Telehealth: Payer: Self-pay

## 2013-06-20 NOTE — Telephone Encounter (Signed)
Patient states that her BP has been fluctuating. Patient has just finished 6 day course of prednisone for back pain. Several minutes into the conversation patient states that she has experienced chest pain and SOB. Advised patient that she should go to be evaluated at the emergency room or the urgent care. Patient refuses to follow recommendations. Wants to be seen by her PCP. Advised against waiting, patient again refuses to go to ED. Put patient on schedule with EXTREMELY emphasizing the need not to wait. States that if she feels any recurrent symptoms she will go to the ED. Patient was cautioned repeatedly against waiting and she states that she understands the risk.

## 2013-06-22 ENCOUNTER — Encounter: Payer: Self-pay | Admitting: Internal Medicine

## 2013-06-22 ENCOUNTER — Ambulatory Visit (INDEPENDENT_AMBULATORY_CARE_PROVIDER_SITE_OTHER): Payer: Medicare Other | Admitting: Internal Medicine

## 2013-06-22 ENCOUNTER — Emergency Department (HOSPITAL_COMMUNITY)
Admission: EM | Admit: 2013-06-22 | Discharge: 2013-06-22 | Disposition: A | Discharge status: Home / Self Care | Payer: Medicare Other | Attending: Emergency Medicine | Admitting: Emergency Medicine

## 2013-06-22 ENCOUNTER — Encounter (HOSPITAL_COMMUNITY): Payer: Self-pay | Admitting: Emergency Medicine

## 2013-06-22 ENCOUNTER — Emergency Department (HOSPITAL_COMMUNITY): Payer: Medicare Other

## 2013-06-22 VITALS — BP 110/70 | HR 84 | Temp 97.9°F | Wt 231.0 lb

## 2013-06-22 DIAGNOSIS — K219 Gastro-esophageal reflux disease without esophagitis: Secondary | ICD-10-CM | POA: Insufficient documentation

## 2013-06-22 DIAGNOSIS — Z79899 Other long term (current) drug therapy: Secondary | ICD-10-CM | POA: Insufficient documentation

## 2013-06-22 DIAGNOSIS — I10 Essential (primary) hypertension: Secondary | ICD-10-CM | POA: Insufficient documentation

## 2013-06-22 DIAGNOSIS — F3289 Other specified depressive episodes: Secondary | ICD-10-CM | POA: Diagnosis not present

## 2013-06-22 DIAGNOSIS — R079 Chest pain, unspecified: Secondary | ICD-10-CM

## 2013-06-22 DIAGNOSIS — G8929 Other chronic pain: Secondary | ICD-10-CM | POA: Insufficient documentation

## 2013-06-22 DIAGNOSIS — R0602 Shortness of breath: Secondary | ICD-10-CM | POA: Insufficient documentation

## 2013-06-22 DIAGNOSIS — M79609 Pain in unspecified limb: Secondary | ICD-10-CM | POA: Insufficient documentation

## 2013-06-22 DIAGNOSIS — Z9889 Other specified postprocedural states: Secondary | ICD-10-CM | POA: Insufficient documentation

## 2013-06-22 DIAGNOSIS — G4700 Insomnia, unspecified: Secondary | ICD-10-CM | POA: Diagnosis not present

## 2013-06-22 DIAGNOSIS — Z87891 Personal history of nicotine dependence: Secondary | ICD-10-CM | POA: Diagnosis not present

## 2013-06-22 DIAGNOSIS — F329 Major depressive disorder, single episode, unspecified: Secondary | ICD-10-CM | POA: Insufficient documentation

## 2013-06-22 DIAGNOSIS — Z7982 Long term (current) use of aspirin: Secondary | ICD-10-CM | POA: Diagnosis not present

## 2013-06-22 DIAGNOSIS — K2270 Barrett's esophagus without dysplasia: Secondary | ICD-10-CM | POA: Diagnosis not present

## 2013-06-22 DIAGNOSIS — G43909 Migraine, unspecified, not intractable, without status migrainosus: Secondary | ICD-10-CM | POA: Insufficient documentation

## 2013-06-22 DIAGNOSIS — E785 Hyperlipidemia, unspecified: Secondary | ICD-10-CM | POA: Diagnosis not present

## 2013-06-22 DIAGNOSIS — M1990 Unspecified osteoarthritis, unspecified site: Secondary | ICD-10-CM | POA: Insufficient documentation

## 2013-06-22 LAB — BASIC METABOLIC PANEL
BUN: 24 mg/dL — ABNORMAL HIGH (ref 6–23)
CO2: 20 mEq/L (ref 19–32)
Calcium: 8.7 mg/dL (ref 8.4–10.5)
Chloride: 108 mEq/L (ref 96–112)
Creatinine, Ser: 1.03 mg/dL (ref 0.50–1.10)
GFR calc Af Amer: 63 mL/min — ABNORMAL LOW (ref >90)
GFR calc non Af Amer: 55 mL/min — ABNORMAL LOW (ref >90)
Glucose, Bld: 117 mg/dL — ABNORMAL HIGH (ref 70–99)
Potassium: 3.9 mEq/L (ref 3.7–5.3)
Sodium: 142 mEq/L (ref 137–147)

## 2013-06-22 LAB — I-STAT TROPONIN, ED
Troponin i, poc: 0.01 ng/mL (ref 0.00–0.08)
Troponin i, poc: 0 ng/mL (ref 0.00–0.08)

## 2013-06-22 LAB — I-STAT CHEM 8, ED
BUN: 21 mg/dL (ref 6–23)
Calcium, Ion: 1.02 mmol/L — ABNORMAL LOW (ref 1.13–1.30)
Chloride: 106 mEq/L (ref 96–112)
Creatinine, Ser: 1.5 mg/dL — ABNORMAL HIGH (ref 0.50–1.10)
Glucose, Bld: 51 mg/dL — ABNORMAL LOW (ref 70–99)
HCT: 31 % — ABNORMAL LOW (ref 36.0–46.0)
Hemoglobin: 10.5 g/dL — ABNORMAL LOW (ref 12.0–15.0)
Potassium: 5.8 mEq/L — ABNORMAL HIGH (ref 3.7–5.3)
Sodium: 138 mEq/L (ref 137–147)
TCO2: 16 mmol/L (ref 0–100)

## 2013-06-22 LAB — CBC
HCT: 39.4 % (ref 36.0–46.0)
Hemoglobin: 12.7 g/dL (ref 12.0–15.0)
MCH: 30.3 pg (ref 26.0–34.0)
MCHC: 32.2 g/dL (ref 30.0–36.0)
MCV: 94 fL (ref 78.0–100.0)
Platelets: 188 10*3/uL (ref 150–400)
RBC: 4.19 MIL/uL (ref 3.87–5.11)
RDW: 13.5 % (ref 11.5–15.5)
WBC: 8.3 10*3/uL (ref 4.0–10.5)

## 2013-06-22 LAB — PRO B NATRIURETIC PEPTIDE: Pro B Natriuretic peptide (BNP): 100.2 pg/mL (ref 0–125)

## 2013-06-22 MED ORDER — SODIUM CHLORIDE 0.9 % IV BOLUS (SEPSIS)
1000.0000 mL | Freq: Once | INTRAVENOUS | Status: AC
  Administered 2013-06-22: 1000 mL via INTRAVENOUS

## 2013-06-22 MED ORDER — SODIUM CHLORIDE 0.9 % IV SOLN
INTRAVENOUS | Status: DC
  Administered 2013-06-22: 20:00:00 via INTRAVENOUS

## 2013-06-22 NOTE — ED Notes (Signed)
Pt reports midsternal chest pain that started about a week ago. Reports that she was sent from her PCP office for further. Reports some SOB with the pain. Denies any n/v. Pt reports that she is seen at [facility]

## 2013-06-22 NOTE — Progress Notes (Signed)
Pre visit review using our clinic review tool, if applicable. No additional management support is needed unless otherwise documented below in the visit note. 

## 2013-06-22 NOTE — ED Notes (Signed)
PT comfortable with discharge and follow up instructions. No prescriptions. 

## 2013-06-22 NOTE — Patient Instructions (Addendum)
Go to the ER at [facility] now

## 2013-06-22 NOTE — ED Provider Notes (Signed)
CSN: [id number]     Arrival date & time 06/22/13  1622 History   First MD Initiated Contact with Patient 06/22/13 1711     Chief Complaint  Patient presents with  . Chest Pain     (Consider location/radiation/quality/duration/timing/severity/associated sxs/prior Treatment) Patient is a 68 y.o. female presenting with chest pain. The history is provided by the patient and a relative.  Chest Pain  She complains of chest pain that started one week ago. Pain is intermittent. It lasts a few minutes. It is a dull sensation. It is associated with shortness of breath. The pain and shortness of breath, resolved spontaneously. There are no known causative factors. She has had this 15 times in the last 5 days. She saw her PCP, today, and he sent her here for further evaluation. She completed a course of prednisone, 3 days ago in treatment for a back problem. She is taking her meds as prescribed. She denies fever or cough, weakness, dizziness. She has noticed some mild, left leg pain recently. There are no other known modifying factors.  Past Medical History  Diagnosis Date  . Hyperlipidemia   . Unspecified essential hypertension   . Osteoporosis   . Hiatal hernia   . Depression   . Esophageal reflux   . Insomnia   . Barrett's esophagus   . RLS (restless legs syndrome)   . Family history of anesthesia complication     Mother had severe N/V  . Migraine   . DJD (degenerative joint disease)     bilateral hands; knees  . Chronic lower back pain   . Chronic diarrhea   . H/O cardiac catheterization     (-) cath 12-2002  , cath again 2011 (-)  . Vitamin B 12 deficiency 04/09/2013   Past Surgical History  Procedure Laterality Date  . Vaginal hysterectomy      for endometriosis  . Oophorectomy    . Bilateral knee arthroscopy    . Foot surgery      Bilateral; "toenails removed; callus removed on right; hammertoes right"  . Arm surgery      Left; "don't remember what they did; arm wasn't broken"   . Colonoscopy w/ polypectomy    . Cardiac catheterization  01/22/10    clean cath  . Shoulder arthroscopy      right  . Lumbar disc surgery      L5 S1 anterior fusion  . Cataract extraction w/ intraocular lens  implant, bilateral    . Knot      "removed from right neck; not a goiter"  . Total knee arthroplasty  10/05/2011    Procedure: TOTAL KNEE ARTHROPLASTY;  Surgeon: Hessie Dibble, MD;  Location: [place];  Service: Orthopedics;  Laterality: Right;   Family History  Problem Relation Age of Onset  . Colon cancer Maternal Grandfather   . Lung cancer Mother   . Esophageal cancer Brother   . Breast cancer Sister   . Diabetes      Aunt  . Dementia Father   . Colon cancer Maternal Uncle     2  . Rectal cancer Neg Hx   . Stomach cancer Neg Hx    History  Substance Use Topics  . Smoking status: Former Smoker -- 0.50 packs/day for 10 years    Quit date: 02/09/1975  . Smokeless tobacco: Never Used     Comment: started at age 34.   quit in the 1970s.  . Alcohol Use: No  Comment: 10/05/2011 "socially;  wine or mixed drink at the most once/month"   OB History   Grav Para Term Preterm Abortions TAB SAB Ect Mult Living                 Review of Systems  Cardiovascular: Positive for chest pain.  All other systems reviewed and are negative.     Allergies  Dextromethorphan-guaifenesin; Morphine; and Penicillins  Home Medications   Prior to Admission medications   Medication Sig Start Date End Date Taking? Authorizing Provider  aspirin EC 81 MG tablet Take 81 mg by mouth daily.   Yes Historical Provider, MD  atenolol (TENORMIN) 25 MG tablet Take 25 mg by mouth daily.   Yes Historical Provider, MD  benazepril (LOTENSIN) 20 MG tablet Take 20 mg by mouth daily.   Yes Historical Provider, MD  HYDROcodone-acetaminophen (NORCO) 10-325 MG per tablet Take 1 tablet by mouth every 4 (four) hours as needed for pain. For back pain 10/07/11  Yes Otelia Limes Carnaghi, PA-C  mirabegron ER  (MYRBETRIQ) 25 MG TB24 tablet Take 25 mg by mouth daily.   Yes Historical Provider, MD  omeprazole (PRILOSEC) 40 MG capsule Take 40 mg by mouth 2 (two) times daily.   Yes Historical Provider, MD  pravastatin (PRAVACHOL) 40 MG tablet Take 40 mg by mouth daily.   Yes Historical Provider, MD  ranitidine (ZANTAC) 300 MG tablet Take 300 mg by mouth at bedtime.   Yes Historical Provider, MD  rOPINIRole (REQUIP) 1 MG tablet Take 1 mg by mouth 2 (two) times daily.   Yes Historical Provider, MD  topiramate (TOPAMAX) 200 MG tablet Take 350 mg by mouth at bedtime. Take 1.5 by mouth at bedtime   Yes Historical Provider, MD  traZODone (DESYREL) 50 MG tablet Take 50 mg by mouth at bedtime.   Yes Historical Provider, MD   BP 146/84  Pulse 61  Temp(Src) 97.8 F (36.6 C) (Oral)  Resp 26  Ht 5\' 5"  (1.651 m)  Wt 230 lb (104.327 kg)  BMI 38.27 kg/m2  SpO2 100% Physical Exam  Nursing note and vitals reviewed. Constitutional: She is oriented to person, place, and time. She appears well-developed and well-nourished. No distress.  HENT:  Head: Normocephalic and atraumatic.  Eyes: Conjunctivae and EOM are normal. Pupils are equal, round, and reactive to light.  Neck: Normal range of motion and phonation normal. Neck supple.  Cardiovascular: Normal rate, regular rhythm and intact distal pulses.   Pulmonary/Chest: Effort normal and breath sounds normal. She exhibits no tenderness.  Abdominal: Soft. She exhibits no distension. There is no tenderness. There is no guarding.  Musculoskeletal: Normal range of motion.  Mild left calf tenderness, without deformity. Chest symmetric swelling of the lower legs, bilaterally.  Neurological: She is alert and oriented to person, place, and time. She exhibits normal muscle tone.  Skin: Skin is warm and dry.  Psychiatric: She has a normal mood and affect. Her behavior is normal. Judgment and thought content normal.    ED Course  Procedures (including critical care  time)   Medications  sodium chloride 0.9 % bolus 1,000 mL (0 mLs Intravenous Stopped 06/22/13 2133)    Patient Vitals for the past 24 hrs:  BP Temp Temp src Pulse Resp SpO2 Height Weight  06/22/13 2122 146/84 mmHg - - - 26 100 % - -  06/22/13 2115 128/63 mmHg - - 61 19 100 % - -  06/22/13 2100 125/62 mmHg - - 69 19 100 % - -  06/22/13 2045 116/65 mmHg - - 63 21 100 % - -  06/22/13 2030 114/59 mmHg - - 62 19 98 % - -  06/22/13 2015 120/63 mmHg - - 64 19 100 % - -  06/22/13 2006 119/53 mmHg - - - 22 100 % - -  06/22/13 1930 - - - 65 17 99 % - -  06/22/13 1900 102/66 mmHg - - 69 20 98 % - -  06/22/13 1845 100/62 mmHg - - 73 23 99 % - -  06/22/13 1800 102/59 mmHg - - 68 22 98 % - -  06/22/13 1730 114/64 mmHg - - 80 20 98 % - -  06/22/13 1700 110/58 mmHg - - 74 22 97 % - -  06/22/13 1630 126/70 mmHg 97.8 F (36.6 C) Oral 76 18 98 % 5\' 5"  (1.651 m) 230 lb (104.327 kg)    9:18 PM Reevaluation with update and discussion. After initial assessment and treatment, an updated evaluation reveals she states that she feels better. Her blood pressures improved.Richarda Blade    21:20- ambulation trial  Labs Review Labs Reviewed  BASIC METABOLIC PANEL - Abnormal; Notable for the following:    Glucose, Bld 117 (*)    BUN 24 (*)    GFR calc non Af Amer 55 (*)    GFR calc Af Amer 63 (*)    All other components within normal limits  I-STAT CHEM 8, ED - Abnormal; Notable for the following:    Potassium 5.8 (*)    Creatinine, Ser 1.50 (*)    Glucose, Bld 51 (*)    Calcium, Ion 1.02 (*)    Hemoglobin 10.5 (*)    HCT 31.0 (*)    All other components within normal limits  CBC  PRO B NATRIURETIC PEPTIDE  I-STAT TROPOININ, ED  I-STAT TROPOININ, ED    Imaging Review Dg Chest 2 View  06/22/2013   CLINICAL DATA:  Chest pain  EXAM: CHEST  2 VIEW  COMPARISON:  September 30, 2011  FINDINGS: There is no edema or consolidation. The heart size and pulmonary vascularity are normal. No adenopathy.  There is mild degenerative change in the thoracic spine. There is evidence of old trauma involving the lateral right clavicle, stable.  IMPRESSION: No edema or consolidation.   Electronically Signed   By: Lowella Grip M.D.   On: 06/22/2013 18:24     EKG Interpretation None      MDM   Final diagnoses:  Nonspecific chest pain    Nonspecific chest pain, atypical for coronary artery disease, with negative EGD, evaluation. Doubt ACS, PE, pneumonia, serious bacterial infection or metabolic instability. I suspect that she has an element of worsening reflux symptoms secondary to recent dosing with prednisone.   Nursing Notes Reviewed/ Care Coordinated Applicable Imaging Reviewed Interpretation of Laboratory Data incorporated into ED treatment  The patient appears reasonably screened and/or stabilized for discharge and I doubt any other medical condition or other [facility] requiring further screening, evaluation, or treatment in the ED at this time prior to discharge.  Plan: Home Medications- usual; Home Treatments- rest; return here if the recommended treatment, does not improve the symptoms; Recommended follow up- PCP, when necessary    Richarda Blade, MD 06/23/13 [phone]

## 2013-06-22 NOTE — Discharge Instructions (Signed)
Chest Pain (Nonspecific) °It is often hard to give a specific diagnosis for the cause of chest pain. There is always a chance that your pain could be related to something serious, such as a heart attack or a blood clot in the lungs. You need to follow up with your caregiver for further evaluation. °CAUSES  °· Heartburn. °· Pneumonia or bronchitis. °· Anxiety or stress. °· Inflammation around your heart (pericarditis) or lung (pleuritis or pleurisy). °· A blood clot in the lung. °· A collapsed lung (pneumothorax). It can develop suddenly on its own (spontaneous pneumothorax) or from injury (trauma) to the chest. °· Shingles infection (herpes zoster virus). °The chest wall is composed of bones, muscles, and cartilage. Any of these can be the source of the pain. °· The bones can be bruised by injury. °· The muscles or cartilage can be strained by coughing or overwork. °· The cartilage can be affected by inflammation and become sore (costochondritis). °DIAGNOSIS  °Lab tests or other studies, such as X-rays, electrocardiography, stress testing, or cardiac imaging, may be needed to find the cause of your pain.  °TREATMENT  °· Treatment depends on what may be causing your chest pain. Treatment may include: °· Acid blockers for heartburn. °· Anti-inflammatory medicine. °· Pain medicine for inflammatory conditions. °· Antibiotics if an infection is present. °· You may be advised to change lifestyle habits. This includes stopping smoking and avoiding alcohol, caffeine, and chocolate. °· You may be advised to keep your head raised (elevated) when sleeping. This reduces the chance of acid going backward from your stomach into your esophagus. °· Most of the time, nonspecific chest pain will improve within 2 to 3 days with rest and mild pain medicine. °HOME CARE INSTRUCTIONS  °· If antibiotics were prescribed, take your antibiotics as directed. Finish them even if you start to feel better. °· For the next few days, avoid physical  activities that bring on chest pain. Continue physical activities as directed. °· Do not smoke. °· Avoid drinking alcohol. °· Only take over-the-counter or prescription medicine for pain, discomfort, or fever as directed by your caregiver. °· Follow your caregiver's suggestions for further testing if your chest pain does not go away. °· Keep any follow-up appointments you made. If you do not go to an appointment, you could develop lasting (chronic) problems with pain. If there is any problem keeping an appointment, you must call to reschedule. °SEEK MEDICAL CARE IF:  °· You think you are having problems from the medicine you are taking. Read your medicine instructions carefully. °· Your chest pain does not go away, even after treatment. °· You develop a rash with blisters on your chest. °SEEK IMMEDIATE MEDICAL CARE IF:  °· You have increased chest pain or pain that spreads to your arm, neck, jaw, back, or abdomen. °· You develop shortness of breath, an increasing cough, or you are coughing up blood. °· You have severe back or abdominal pain, feel nauseous, or vomit. °· You develop severe weakness, fainting, or chills. °· You have a fever. °THIS IS AN EMERGENCY. Do not wait to see if the pain will go away. Get medical help at once. Call your local emergency services (911 in [place] Do not drive yourself to the hospital. °MAKE SURE YOU:  °· Understand these instructions. °· Will watch your condition. °· Will get help right away if you are not doing well or get worse. °Document Released: 11/04/2004 Document Revised: 04/19/2011 Document Reviewed: 08/31/2007 °ExitCare® Patient Information ©2014 [employer]   [employer]. ° °

## 2013-06-22 NOTE — Progress Notes (Signed)
Subjective:    Patient ID: [name], female    DOB: 08-13-1945, 68 y.o.   MRN: [id number]  DOS:  06/22/2013 Type of  visit: Acute visit. Was Rx prednisone x 6 days, finished 06-19-13. Since then noted several sx: BP range  from very high to very  low Also noted on and off chest pain at anterior upper chest , like something sitting in the chest, no radiation to the neck or shoulders, episodes last 2 or 3 minutes, may change depending on what she eats. Also dyspnea on exertion walking to her yard, that is different from previous weeks.   ROS Denies fever chills. No calf swelling, no recent airplane trips or prolonged car trips Note nausea, vomiting or heartburn. No abdominal rash No cough or hemoptysis.   Past Medical History  Diagnosis Date  . Hyperlipidemia   . Unspecified essential hypertension   . Osteoporosis   . Hiatal hernia   . Depression   . Esophageal reflux   . Insomnia   . Barrett's esophagus   . RLS (restless legs syndrome)   . Family history of anesthesia complication     Mother had severe N/V  . Migraine   . DJD (degenerative joint disease)     bilateral hands; knees  . Chronic lower back pain   . Chronic diarrhea   . H/O cardiac catheterization     (-) cath 12-2002  , cath again 2011 (-)  . Vitamin B 12 deficiency 04/09/2013    Past Surgical History  Procedure Laterality Date  . Vaginal hysterectomy      for endometriosis  . Oophorectomy    . Bilateral knee arthroscopy    . Foot surgery      Bilateral; "toenails removed; callus removed on right; hammertoes right"  . Arm surgery      Left; "don't remember what they did; arm wasn't broken"  . Colonoscopy w/ polypectomy    . Cardiac catheterization  01/22/10    clean cath  . Shoulder arthroscopy      right  . Lumbar disc surgery      L5 S1 anterior fusion  . Cataract extraction w/ intraocular lens  implant, bilateral    . Knot      "removed from right neck; not a goiter"  . Total knee  arthroplasty  10/05/2011    Procedure: TOTAL KNEE ARTHROPLASTY;  Surgeon: Hessie Dibble, MD;  Location: [place];  Service: Orthopedics;  Laterality: Right;    History   Social History  . Marital Status: Divorced    Spouse Name: N/A    Number of Children: 1  . Years of Education: N/A   Occupational History  . Retired, post office     Social History Main Topics  . Smoking status: Former Smoker -- 0.50 packs/day for 10 years    Quit date: 02/09/1975  . Smokeless tobacco: Never Used     Comment: started at age 74.   quit in the 1970s.  . Alcohol Use: No     Comment: 10/05/2011 "socially;  wine or mixed drink at the most once/month"  . Drug Use: No  . Sexual Activity: Yes   Other Topics Concern  . Not on file   Social History Narrative  . No narrative on file        Medication List       This list is accurate as of: 06/22/13  2:54 PM.  Always use your most recent med list.  aspirin EC 81 MG tablet  Take 81 mg by mouth daily.     atenolol 25 MG tablet  Commonly known as:  TENORMIN  TAKE 1 TABLET (25 MG TOTAL) BY MOUTH DAILY.     benazepril 20 MG tablet  Commonly known as:  LOTENSIN  TAKE 1 TABLET (20 MG TOTAL) BY MOUTH DAILY.     celecoxib 200 MG capsule  Commonly known as:  CELEBREX  Take 200 mg by mouth as needed for pain.     cyanocobalamin 1000 MCG/ML injection  Commonly known as:  (VITAMIN B-12)  Inject 1 mL (1,000 mcg total) into the muscle once.     cyclobenzaprine 10 MG tablet  Commonly known as:  FLEXERIL  Take 10 mg by mouth 3 (three) times daily as needed. For migraines.     diazepam 5 MG tablet  Commonly known as:  VALIUM     HYDROcodone-acetaminophen 10-325 MG per tablet  Commonly known as:  NORCO  Take 1 tablet by mouth every 4 (four) hours as needed for pain. For back pain     MYRBETRIQ 25 MG Tb24 tablet  Generic drug:  mirabegron ER  Take 25 mg by mouth daily.     NON FORMULARY  [place] eye drops, both eyes daily      nystatin-triamcinolone cream  Commonly known as:  MYCOLOG II     omeprazole 40 MG capsule  Commonly known as:  PRILOSEC  TAKE 1 CAPSULE BY MOUTH 2 TIMES A DAY     pravastatin 40 MG tablet  Commonly known as:  PRAVACHOL  TAKE 1 TABLET BY MOUTH EVERY DAY     ranitidine 300 MG tablet  Commonly known as:  ZANTAC  TAKE 1 TABLET BY MOUTH AT BEDTIME     ranitidine 300 MG tablet  Commonly known as:  ZANTAC  TAKE 1 TABLET BY MOUTH AT BEDTIME     rOPINIRole 1 MG tablet  Commonly known as:  REQUIP  TAKE 1 TABLET (1 MG TOTAL) BY MOUTH 2 (TWO) TIMES DAILY.     rOPINIRole 1 MG tablet  Commonly known as:  REQUIP  TAKE 1 TABLET (1 MG TOTAL) BY MOUTH 2 (TWO) TIMES DAILY.     SYSTANE OP  Place 2 drops into both eyes daily as needed. For dry or burning eyes     topiramate 200 MG tablet  Commonly known as:  TOPAMAX  Take 350 mg by mouth at bedtime. Take 1.5 by mouth at bedtime     traZODone 50 MG tablet  Commonly known as:  DESYREL  TAKE 1-2 TABLETS (50-100 MG TOTAL) BY MOUTH AT BEDTIME.           Objective:   Physical Exam BP 110/70  Pulse 84  Temp(Src) 97.9 F (36.6 C)  Wt 231 lb (104.781 kg)  SpO2 98%  General -- alert, well-developed, NAD.   HEENT-- Not pale.   Lungs -- normal respiratory effort, no intercostal retractions, no accessory muscle use, and normal breath sounds. Chest wall-- pain not reproducible  Heart-- normal rate, regular rhythm, no murmur.  Abdomen-- Not distended, good bowel sounds,soft, slt tender @ the epigastrium. Extremities-- no pretibial edema bilaterally, calves symmetric, slt tender @ the L calf?  Neurologic--  alert & oriented X3. Speech normal, gait normal, strength normal in all extremities.  Psych-- Cognition and judgment appear intact. Cooperative with normal attention span and concentration. No anxious or depressed appearing.       Assessment & Plan:   Chest  pain 68 year old lady presents with several days history of on and off chest  pain ("something sitting in my chest") along with dyspnea on exertion. Vital signs stable, exam is benign except for some   tenderness at the left calf EKG RBBB which is not new. Plan: I advised the patient to go to the ER now for further eval, this could be CAD versus others such as PE. Discussed w/ MD at the ER. Will go via private vehicle

## 2013-06-22 NOTE — ED Notes (Signed)
Pt ambulated well 

## 2013-06-28 ENCOUNTER — Ambulatory Visit (INDEPENDENT_AMBULATORY_CARE_PROVIDER_SITE_OTHER): Payer: Medicare Other | Admitting: Internal Medicine

## 2013-06-28 ENCOUNTER — Encounter: Payer: Self-pay | Admitting: Internal Medicine

## 2013-06-28 ENCOUNTER — Ambulatory Visit (HOSPITAL_BASED_OUTPATIENT_CLINIC_OR_DEPARTMENT_OTHER)
Admission: RE | Admit: 2013-06-28 | Discharge: 2013-06-28 | Disposition: A | Discharge status: Home / Self Care | Payer: Medicare Other | Source: Ambulatory Visit | Attending: Internal Medicine | Admitting: Internal Medicine

## 2013-06-28 VITALS — BP 128/68 | HR 69 | Temp 98.0°F | Wt 230.0 lb

## 2013-06-28 DIAGNOSIS — M25569 Pain in unspecified knee: Secondary | ICD-10-CM

## 2013-06-28 DIAGNOSIS — R079 Chest pain, unspecified: Secondary | ICD-10-CM

## 2013-06-28 DIAGNOSIS — M79609 Pain in unspecified limb: Secondary | ICD-10-CM | POA: Diagnosis not present

## 2013-06-28 NOTE — Progress Notes (Signed)
Pre-visit discussion using our clinic review tool. No additional management support is needed unless otherwise documented below in the visit note.  

## 2013-06-28 NOTE — Progress Notes (Signed)
Subjective:    Patient ID: [name], female    DOB: 1945-09-21, 68 y.o.   MRN: [id number]  DOS:  06/28/2013 Type of  visit: F/u from previous visit Went to the ER for further eval of chest pain, chest x-ray negative, BNP, troponin, BMP and CBC were okay. EKGs stable. Symptoms suspected to be from ? GERD. She is here for followup.   ROS Since the last visit she has improved, chest pain essentially gone, DOE almost gone. Denies lower extremity edema, no calf pain per se. No nausea, vomiting, diarrhea or blood in the stools. Occasionally has heartburn  Past Medical History  Diagnosis Date  . Hyperlipidemia   . Unspecified essential hypertension   . Osteoporosis   . Hiatal hernia   . Depression   . Esophageal reflux   . Insomnia   . Barrett's esophagus   . RLS (restless legs syndrome)   . Family history of anesthesia complication     Mother had severe N/V  . Migraine   . DJD (degenerative joint disease)     bilateral hands; knees  . Chronic lower back pain   . Chronic diarrhea   . H/O cardiac catheterization     (-) cath 12-2002  , cath again 2011 (-)  . Vitamin B 12 deficiency 04/09/2013    Past Surgical History  Procedure Laterality Date  . Vaginal hysterectomy      for endometriosis  . Oophorectomy    . Bilateral knee arthroscopy    . Foot surgery      Bilateral; "toenails removed; callus removed on right; hammertoes right"  . Arm surgery      Left; "don't remember what they did; arm wasn't broken"  . Colonoscopy w/ polypectomy    . Cardiac catheterization  01/22/10    clean cath  . Shoulder arthroscopy      right  . Lumbar disc surgery      L5 S1 anterior fusion  . Cataract extraction w/ intraocular lens  implant, bilateral    . Knot      "removed from right neck; not a goiter"  . Total knee arthroplasty  10/05/2011    Procedure: TOTAL KNEE ARTHROPLASTY;  Surgeon: Hessie Dibble, MD;  Location: [place];  Service: Orthopedics;  Laterality: Right;     History   Social History  . Marital Status: Divorced    Spouse Name: N/A    Number of Children: 1  . Years of Education: N/A   Occupational History  . Retired, post office     Social History Main Topics  . Smoking status: Former Smoker -- 0.50 packs/day for 10 years    Quit date: 02/09/1975  . Smokeless tobacco: Never Used     Comment: started at age 36.   quit in the 1970s.  . Alcohol Use: No     Comment: 10/05/2011 "socially;  wine or mixed drink at the most once/month"  . Drug Use: No  . Sexual Activity: Yes   Other Topics Concern  . Not on file   Social History Narrative  . No narrative on file        Medication List       This list is accurate as of: 06/28/13 11:59 PM.  Always use your most recent med list.               aspirin EC 81 MG tablet  Take 81 mg by mouth daily.     atenolol 25 MG  tablet  Commonly known as:  TENORMIN  Take 25 mg by mouth daily.     benazepril 20 MG tablet  Commonly known as:  LOTENSIN  Take 20 mg by mouth daily.     HYDROcodone-acetaminophen 10-325 MG per tablet  Commonly known as:  NORCO  Take 1 tablet by mouth every 4 (four) hours as needed for pain. For back pain     MYRBETRIQ 25 MG Tb24 tablet  Generic drug:  mirabegron ER  Take 25 mg by mouth daily.     omeprazole 40 MG capsule  Commonly known as:  PRILOSEC  Take 40 mg by mouth 2 (two) times daily.     pravastatin 40 MG tablet  Commonly known as:  PRAVACHOL  Take 40 mg by mouth daily.     ranitidine 300 MG tablet  Commonly known as:  ZANTAC  Take 300 mg by mouth at bedtime.     rOPINIRole 1 MG tablet  Commonly known as:  REQUIP  Take 1 mg by mouth 2 (two) times daily.     topiramate 200 MG tablet  Commonly known as:  TOPAMAX  Take 350 mg by mouth at bedtime. Take 1.5 by mouth at bedtime     traZODone 50 MG tablet  Commonly known as:  DESYREL  Take 50 mg by mouth at bedtime.           Objective:   Physical Exam BP 128/68  Pulse 69   Temp(Src) 98 F (36.7 C) (Oral)  Wt 230 lb (104.327 kg)  SpO2 98% General -- alert, well-developed, NAD.   Lungs -- normal respiratory effort, no intercostal retractions, no accessory muscle use, and normal breath sounds.  Heart-- normal rate, regular rhythm, no murmur.  Extremities-- no pretibial edema bilaterally ; calves symmetric in size, left calf is still slightly tender to palpation. Neurologic--  alert & oriented X3. Speech normal, gait normal, strength normal in all extremities.  Psych-- Cognition and judgment appear intact. Cooperative with normal attention span and concentration. No anxious or depressed appearing.         Assessment & Plan:    Chest pain, Status post ER eval, workup was negative, no acute coronary syndrome. I'm still somehow concerned about the tenderness in the left calf, will do an ultrasound to rule out DVT. Otherwise recommend patient to let me know if symptoms resurface Addendum-- u/s neg for DVT

## 2013-06-28 NOTE — Patient Instructions (Signed)
Talk with [place] about the ultrasound  If the symptoms come back let me know

## 2013-07-13 ENCOUNTER — Ambulatory Visit (INDEPENDENT_AMBULATORY_CARE_PROVIDER_SITE_OTHER): Payer: Medicare Other | Admitting: *Deleted

## 2013-07-13 DIAGNOSIS — E538 Deficiency of other specified B group vitamins: Secondary | ICD-10-CM

## 2013-07-13 MED ORDER — CYANOCOBALAMIN 1000 MCG/ML IJ SOLN
1000.0000 ug | Freq: Once | INTRAMUSCULAR | Status: AC
  Administered 2013-07-13: 1000 ug via INTRAMUSCULAR

## 2013-07-25 ENCOUNTER — Other Ambulatory Visit: Payer: Self-pay | Admitting: Internal Medicine

## 2013-08-09 ENCOUNTER — Ambulatory Visit (INDEPENDENT_AMBULATORY_CARE_PROVIDER_SITE_OTHER): Payer: Medicare Other | Admitting: *Deleted

## 2013-08-09 DIAGNOSIS — E538 Deficiency of other specified B group vitamins: Secondary | ICD-10-CM | POA: Diagnosis not present

## 2013-08-09 MED ORDER — CYANOCOBALAMIN 1000 MCG/ML IJ SOLN
1000.0000 ug | Freq: Once | INTRAMUSCULAR | Status: AC
  Administered 2013-08-09: 1000 ug via INTRAMUSCULAR

## 2013-08-30 ENCOUNTER — Other Ambulatory Visit: Payer: Self-pay | Admitting: Internal Medicine

## 2013-08-31 ENCOUNTER — Encounter: Payer: Self-pay | Admitting: Internal Medicine

## 2013-09-06 DIAGNOSIS — G43019 Migraine without aura, intractable, without status migrainosus: Secondary | ICD-10-CM | POA: Diagnosis not present

## 2013-09-06 DIAGNOSIS — Z79899 Other long term (current) drug therapy: Secondary | ICD-10-CM | POA: Diagnosis not present

## 2013-09-10 ENCOUNTER — Encounter: Payer: Self-pay | Admitting: Internal Medicine

## 2013-09-10 ENCOUNTER — Ambulatory Visit (INDEPENDENT_AMBULATORY_CARE_PROVIDER_SITE_OTHER): Payer: Medicare Other | Admitting: Internal Medicine

## 2013-09-10 VITALS — BP 106/68 | HR 61 | Temp 98.3°F | Ht 64.9 in | Wt 225.0 lb

## 2013-09-10 DIAGNOSIS — F331 Major depressive disorder, recurrent, moderate: Secondary | ICD-10-CM | POA: Insufficient documentation

## 2013-09-10 DIAGNOSIS — I10 Essential (primary) hypertension: Secondary | ICD-10-CM | POA: Diagnosis not present

## 2013-09-10 DIAGNOSIS — F329 Major depressive disorder, single episode, unspecified: Secondary | ICD-10-CM

## 2013-09-10 DIAGNOSIS — E538 Deficiency of other specified B group vitamins: Secondary | ICD-10-CM

## 2013-09-10 DIAGNOSIS — Z23 Encounter for immunization: Secondary | ICD-10-CM

## 2013-09-10 DIAGNOSIS — F419 Anxiety disorder, unspecified: Secondary | ICD-10-CM | POA: Insufficient documentation

## 2013-09-10 DIAGNOSIS — F3289 Other specified depressive episodes: Secondary | ICD-10-CM

## 2013-09-10 DIAGNOSIS — M810 Age-related osteoporosis without current pathological fracture: Secondary | ICD-10-CM

## 2013-09-10 DIAGNOSIS — E785 Hyperlipidemia, unspecified: Secondary | ICD-10-CM

## 2013-09-10 DIAGNOSIS — R42 Dizziness and giddiness: Secondary | ICD-10-CM

## 2013-09-10 DIAGNOSIS — R079 Chest pain, unspecified: Secondary | ICD-10-CM

## 2013-09-10 DIAGNOSIS — Z0000 Encounter for general adult medical examination without abnormal findings: Secondary | ICD-10-CM

## 2013-09-10 DIAGNOSIS — F32A Depression, unspecified: Secondary | ICD-10-CM | POA: Insufficient documentation

## 2013-09-10 MED ORDER — CYANOCOBALAMIN 1000 MCG/ML IJ SOLN
1000.0000 ug | Freq: Once | INTRAMUSCULAR | Status: AC
  Administered 2013-09-10: 1000 ug via INTRAMUSCULAR

## 2013-09-10 MED ORDER — ZOSTER VACCINE LIVE 19400 UNT/0.65ML ~~LOC~~ SOLR: 0.6500 mL | Freq: Once | SUBCUTANEOUS | Status: DC

## 2013-09-10 MED ORDER — ESCITALOPRAM OXALATE 10 MG PO TABS: 10.0000 mg | ORAL_TABLET | Freq: Every day | ORAL | Status: DC

## 2013-09-10 NOTE — Assessment & Plan Note (Signed)
B12 shot today, labs

## 2013-09-10 NOTE — Progress Notes (Signed)
Pre visit review using our clinic review tool, if applicable. No additional management support is needed unless otherwise documented below in the visit note. 

## 2013-09-10 NOTE — Assessment & Plan Note (Addendum)
PHQ 9 ++ for deression, scored 20.  Long history of depression, in the past she saw psychiatry and took medications, does not recall the name of the medicines. At this point we agreed on start lexapro, RTC 6 weeks, pt is counseled today but rec to seek formal  counseling

## 2013-09-10 NOTE — Assessment & Plan Note (Signed)
Bone density test apparently not done, Rx one today

## 2013-09-10 NOTE — Progress Notes (Signed)
Subjective:    Patient ID: [name], female    DOB: 01-23-1946, 68 y.o.   MRN: [id number]  DOS:  09/10/2013 Type of visit - description:   Here for Medicare AWV:   1. Risk factors based on Past M, S, F history: reviewed   2. Physical Activities: does home chores, not doing yard work d/t back-knee  pain.   3. Depression/mood:  see below  4. Hearing: no problems reported or noted ; h/o tinnitus, s/p ENT eval, they rec observation, sx stable   5. ADL's: independent   6. Fall Risk: no recent falls,  prevention discussed   7. Home Safety: does feel safe at home   8. Height, weight, &visual acuity: see VS, saw  the eye doctor, ext visit in few weeks    9. Counseling: yes   10. Labs ordered based on risk factors: yes   11. Referral Coordination, if needed   12. Cognitive Assessment: cognition and memory wnl, motor skills limited by pain and deconditioning    In addition, we discussed the following: When asked about depression, she reports that she feels down/sad sometimes, this is going on for a while, denies any suicidal ideas. Headaches, well-controlled on Topamax Hyperlipidemia, take her medications without apparent side effects H/o Barrett's , symptoms well-controlled, good compliance with double therapy.  ROS Denies chest pain, difficulty breathing. Occasionally bilateral ankle edema. Denies nausea, vomiting, diarrhea. No blood in the stools. No classic heartburn, dysphagia or odynophagia but occasionally gets hoarse for the last 2 weeks. No dysuria, gross hematuria and difficulty urinating      Past Medical History  Diagnosis Date  . Hyperlipidemia   . HTN (hypertension)   . Osteoporosis   . Hiatal hernia   . Depression   . Esophageal reflux   . Insomnia   . Barrett's esophagus   . RLS (restless legs syndrome)   . Family history of anesthesia complication     Mother had severe N/V  . Migraine   . DJD (degenerative joint disease)     bilateral hands; knees  .  Chronic lower back pain   . Chronic diarrhea   . H/O cardiac catheterization     (-) cath 12-2002  , cath again 2011 (-)  . Vitamin B 12 deficiency 04/09/2013    Past Surgical History  Procedure Laterality Date  . Vaginal hysterectomy      for endometriosis  . Oophorectomy    . Bilateral knee arthroscopy    . Foot surgery      Bilateral; "toenails removed; callus removed on right; hammertoes right"  . Arm surgery      Left; "don't remember what they did; arm wasn't broken"  . Colonoscopy w/ polypectomy    . Cardiac catheterization  01/22/10    clean cath  . Shoulder arthroscopy      right  . Lumbar disc surgery      L5 S1 anterior fusion  . Cataract extraction w/ intraocular lens  implant, bilateral    . Knot      "removed from right neck; not a goiter"  . Total knee arthroplasty  10/05/2011    Procedure: TOTAL KNEE ARTHROPLASTY;  Surgeon: Hessie Dibble, MD;  Location: [facility];  Service: Orthopedics;  Laterality: Right;    History   Social History  . Marital Status: Divorced    Spouse Name: N/A    Number of Children: 1  . Years of Education: N/A   Occupational History  .  Retired, post office     Social History Main Topics  . Smoking status: Former Smoker -- 0.50 packs/day for 10 years    Quit date: 02/09/1975  . Smokeless tobacco: Never Used     Comment: started at age 40.   quit in the 1970s.  . Alcohol Use: Yes     Comment: 10/05/2011 "socially;  wine or mixed drink at the most once/month"  . Drug Use: No  . Sexual Activity: Yes   Other Topics Concern  . Not on file   Social History Narrative   Son lives w/ her      Family History  Problem Relation Age of Onset  . Colon cancer Maternal Grandfather   . Lung cancer Mother   . Esophageal cancer Brother   . Breast cancer Sister   . Diabetes Other     Aunt  . Dementia Father   . Colon cancer Maternal Uncle     2  . Rectal cancer Neg Hx   . Stomach cancer Neg Hx   . CAD Father     dx in his 54s  .  Stroke Father        Medication List       This list is accurate as of: 09/10/13 10:24 PM.  Always use your most recent med list.               aspirin EC 81 MG tablet  Take 81 mg by mouth daily.     atenolol 25 MG tablet  Commonly known as:  TENORMIN  TAKE 1/2  TABLET (25 MG TOTAL) BY MOUTH DAILY.     benazepril 20 MG tablet  Commonly known as:  LOTENSIN  TAKE 1 TABLET (20 MG TOTAL) BY MOUTH DAILY.     escitalopram 10 MG tablet  Commonly known as:  LEXAPRO  Take 1 tablet (10 mg total) by mouth at bedtime.     HYDROcodone-acetaminophen 10-325 MG per tablet  Commonly known as:  NORCO  Take 1 tablet by mouth every 4 (four) hours as needed for pain. For back pain     MYRBETRIQ 25 MG Tb24 tablet  Generic drug:  mirabegron ER  Take 25 mg by mouth daily.     omeprazole 40 MG capsule  Commonly known as:  PRILOSEC  Take 40 mg by mouth 2 (two) times daily.     pravastatin 40 MG tablet  Commonly known as:  PRAVACHOL  Take 40 mg by mouth daily.     ranitidine 300 MG tablet  Commonly known as:  ZANTAC  Take 300 mg by mouth at bedtime.     rOPINIRole 1 MG tablet  Commonly known as:  REQUIP  Take 1 mg by mouth 2 (two) times daily.     topiramate 200 MG tablet  Commonly known as:  TOPAMAX  Take 400 mg by mouth 2 (two) times daily. Take 1.5 by mouth at bedtime     traZODone 50 MG tablet  Commonly known as:  DESYREL  Take 50 mg by mouth at bedtime.     zoster vaccine live (PF) 19400 UNT/0.65ML injection  Commonly known as:  ZOSTAVAX  Inject 19,400 Units into the skin once.           Objective:   Physical Exam BP 106/68  Pulse 61  Temp(Src) 98.3 F (36.8 C)  Ht 5' 4.9" (1.648 m)  Wt 225 lb (102.059 kg)  BMI 37.58 kg/m2  SpO2 98%  General -- alert, well-developed,  NAD.  Neck --no thyromegaly , normal carotid pulse  HEENT-- Not pale.  Lungs -- normal respiratory effort, no intercostal retractions, no accessory muscle use, and normal breath sounds.    Heart-- normal rate, regular rhythm, no murmur.  Abdomen-- Not distended, good bowel sounds,soft, non-tender. Extremities-- trace peri-ankle edema bilaterally  Neurologic--  alert & oriented X3. Speech normal, gait appropriate for age although moderately limited by DJD, strength symmetric and appropriate for age.   Psych-- Cognition and judgment appear intact. Cooperative with normal attention span and concentration. No anxious or depressed appearing.     Assessment & Plan:

## 2013-09-10 NOTE — Assessment & Plan Note (Addendum)
On benazepril and atenolol, BP slightly low today, occ low at home  . Last potassium was elevated. She sometimes complains of fatigue. Plan:   BMP, CBC. Decrease atenolol to half Monitor BPs, see instructions

## 2013-09-10 NOTE — Assessment & Plan Note (Signed)
EGD 06/13/2011, Dr. Fuller Plan, next per GI. Patient is asymptomatic, continue with H2 blockers and PPIs

## 2013-09-10 NOTE — Assessment & Plan Note (Addendum)
Td 2000 and 2012   Pneumonia shot-- 2012 prevnar today shingles inmunization  benefits discussed, Rx provided before, decided so far not to take but got a new rx today  Colonoscopy 03-2006, Colonoscopy 10-11 , essentially negative,  next  colonoscopy 2016  Per Dr Louanna Raw, states was told she is not due to see them. Per pt (-)  MMG 2014 (at gyn office)  counseled about diet and exercise

## 2013-09-10 NOTE — Assessment & Plan Note (Signed)
Patient not fasting, will check on return to the office

## 2013-09-10 NOTE — Assessment & Plan Note (Signed)
Not an issue at this time

## 2013-09-10 NOTE — Patient Instructions (Addendum)
Get your blood work before you leave   Start lexapro  Take half atenolol a day Check the  blood pressure 2 or 3 times a  week be sure it is between 110/60 and 140/85. Ideal blood pressure is 120/80. If it is consistently higher or lower, let me know  Next visit is for routine check up in 6 weeks ,    fasting Please make an appointment     Fall Prevention and Home Safety Falls cause injuries and can affect all age groups. It is possible to use preventive measures to significantly decrease the likelihood of falls. There are many simple measures which can make your home safer and prevent falls. OUTDOORS  Repair cracks and edges of walkways and driveways.  Remove high doorway thresholds.  Trim shrubbery on the main path into your home.  Have good outside lighting.  Clear walkways of tools, rocks, debris, and clutter.  Check that handrails are not broken and are securely fastened. Both sides of steps should have handrails.  Have leaves, snow, and ice cleared regularly.  Use sand or salt on walkways during winter months.  In the garage, clean up grease or oil spills. BATHROOM`  Install night lights.  Install grab bars by the toilet and in the tub and shower.  Use non-skid mats or decals in the tub or shower.  Place a plastic non-slip stool in the shower to sit on, if needed.  Keep floors dry and clean up all water on the floor immediately.  Remove soap buildup in the tub or shower on a regular basis.  Secure bath mats with non-slip, double-sided rug tape.  Remove throw rugs and tripping hazards from the floors. BEDROOMS  Install night lights.  Make sure a bedside light is easy to reach.  Do not use oversized bedding.  Keep a telephone by your bedside.  Have a firm chair with side arms to use for getting dressed.  Remove throw rugs and tripping hazards from the floor. KITCHEN  Keep handles on pots and pans turned toward the center of the stove. Use back  burners when possible.  Clean up spills quickly and allow time for drying.  Avoid walking on wet floors.  Avoid hot utensils and knives.  Position shelves so they are not too high or low.  Place commonly used objects within easy reach.  If necessary, use a sturdy step stool with a grab bar when reaching.  Keep electrical cables out of the way.  Do not use floor polish or wax that makes floors slippery. If you must use wax, use non-skid floor wax.  Remove throw rugs and tripping hazards from the floor. STAIRWAYS  Never leave objects on stairs.  Place handrails on both sides of stairways and use them. Fix any loose handrails. Make sure handrails on both sides of the stairways are as long as the stairs.  Check carpeting to make sure it is firmly attached along stairs. Make repairs to worn or loose carpet promptly.  Avoid placing throw rugs at the top or bottom of stairways, or properly secure the rug with carpet tape to prevent slippage. Get rid of throw rugs, if possible.  Have an electrician put in a light switch at the top and bottom of the stairs. OTHER FALL PREVENTION TIPS  Wear low-heel or rubber-soled shoes that are supportive and fit well. Wear closed toe shoes.  When using a stepladder, make sure it is fully opened and both spreaders are firmly locked. Do not  climb a closed stepladder.  Add color or contrast paint or tape to grab bars and handrails in your home. Place contrasting color strips on first and last steps.  Learn and use mobility aids as needed. Install an electrical emergency response system.  Turn on lights to avoid dark areas. Replace light bulbs that burn out immediately. Get light switches that glow.  Arrange furniture to create clear pathways. Keep furniture in the same place.  Firmly attach carpet with non-skid or double-sided tape.  Eliminate uneven floor surfaces.  Select a carpet pattern that does not visually hide the edge of steps.  Be  aware of all pets. OTHER HOME SAFETY TIPS  Set the water temperature for 120 F (48.8 C).  Keep emergency numbers on or near the telephone.  Keep smoke detectors on every level of the home and near sleeping areas. Document Released: 01/15/2002 Document Revised: 07/27/2011 Document Reviewed: 04/16/2011 [facility] Patient Information 2015 [place], [place]. This information is not intended to replace advice given to you by your health care provider. Make sure you discuss any questions you have with your health care provider.

## 2013-09-10 NOTE — Assessment & Plan Note (Signed)
No further chest pain

## 2013-09-11 ENCOUNTER — Ambulatory Visit: Payer: Self-pay

## 2013-09-11 LAB — COMPREHENSIVE METABOLIC PANEL
ALT: 11 U/L (ref 0–35)
AST: 16 U/L (ref 0–37)
Albumin: 3.9 g/dL (ref 3.5–5.2)
Alkaline Phosphatase: 75 U/L (ref 39–117)
BUN: 15 mg/dL (ref 6–23)
CO2: 23 mEq/L (ref 19–32)
Calcium: 8.9 mg/dL (ref 8.4–10.5)
Chloride: 109 mEq/L (ref 96–112)
Creatinine, Ser: 1.1 mg/dL (ref 0.4–1.2)
GFR: 54.05 mL/min — ABNORMAL LOW (ref >60.00)
Glucose, Bld: 84 mg/dL (ref 70–99)
Potassium: 4.2 mEq/L (ref 3.5–5.1)
Sodium: 139 mEq/L (ref 135–145)
Total Bilirubin: 0.7 mg/dL (ref 0.2–1.2)
Total Protein: 7.1 g/dL (ref 6.0–8.3)

## 2013-09-11 LAB — CBC WITH DIFFERENTIAL/PLATELET
Basophils Absolute: 0.1 10*3/uL (ref 0.0–0.1)
Basophils Relative: 1.1 % (ref 0.0–3.0)
Eosinophils Absolute: 0.1 10*3/uL (ref 0.0–0.7)
Eosinophils Relative: 1.2 % (ref 0.0–5.0)
HCT: 37.5 % (ref 36.0–46.0)
Hemoglobin: 12.3 g/dL (ref 12.0–15.0)
Lymphocytes Relative: 35.4 % (ref 12.0–46.0)
Lymphs Abs: 2.3 10*3/uL (ref 0.7–4.0)
MCHC: 32.8 g/dL (ref 30.0–36.0)
MCV: 93.5 fl (ref 78.0–100.0)
Monocytes Absolute: 0.3 10*3/uL (ref 0.1–1.0)
Monocytes Relative: 4.5 % (ref 3.0–12.0)
Neutro Abs: 3.8 10*3/uL (ref 1.4–7.7)
Neutrophils Relative %: 57.8 % (ref 43.0–77.0)
Platelets: 179 10*3/uL (ref 150.0–400.0)
RBC: 4 Mil/uL (ref 3.87–5.11)
RDW: 13.3 % (ref 11.5–15.5)
WBC: 6.5 10*3/uL (ref 4.0–10.5)

## 2013-09-11 LAB — VITAMIN B12: Vitamin B-12: >1500 pg/mL — ABNORMAL HIGH (ref 211–911)

## 2013-09-18 ENCOUNTER — Ambulatory Visit (INDEPENDENT_AMBULATORY_CARE_PROVIDER_SITE_OTHER)
Admission: RE | Admit: 2013-09-18 | Discharge: 2013-09-18 | Disposition: A | Discharge status: Home / Self Care | Payer: Medicare Other | Source: Ambulatory Visit | Attending: Internal Medicine | Admitting: Internal Medicine

## 2013-09-18 DIAGNOSIS — M810 Age-related osteoporosis without current pathological fracture: Secondary | ICD-10-CM

## 2013-09-20 ENCOUNTER — Other Ambulatory Visit: Payer: Self-pay | Admitting: Internal Medicine

## 2013-09-28 ENCOUNTER — Ambulatory Visit (INDEPENDENT_AMBULATORY_CARE_PROVIDER_SITE_OTHER): Payer: Medicare Other | Admitting: Psychology

## 2013-09-28 DIAGNOSIS — F331 Major depressive disorder, recurrent, moderate: Secondary | ICD-10-CM | POA: Diagnosis not present

## 2013-10-10 ENCOUNTER — Ambulatory Visit (INDEPENDENT_AMBULATORY_CARE_PROVIDER_SITE_OTHER): Payer: Medicare Other | Admitting: Psychology

## 2013-10-10 DIAGNOSIS — F331 Major depressive disorder, recurrent, moderate: Secondary | ICD-10-CM

## 2013-10-11 ENCOUNTER — Ambulatory Visit (INDEPENDENT_AMBULATORY_CARE_PROVIDER_SITE_OTHER): Payer: Medicare Other

## 2013-10-11 DIAGNOSIS — E538 Deficiency of other specified B group vitamins: Secondary | ICD-10-CM | POA: Diagnosis not present

## 2013-10-11 MED ORDER — CYANOCOBALAMIN 1000 MCG/ML IJ SOLN
1000.0000 ug | Freq: Once | INTRAMUSCULAR | Status: AC
  Administered 2013-10-11: 1000 ug via INTRAMUSCULAR

## 2013-10-11 NOTE — Progress Notes (Signed)
Subjective:     Patient ID: [name], female   DOB: [birth year], 68 y.o.   MRN: [id number]  HPI Comments: Reviewed last B12 lab with Dr. Larose Kells.  Advised to administer the B12 shot.  Verbal Order Read Back.      Review of Systems     Objective:   Physical Exam     Assessment:        Plan:

## 2013-10-18 DIAGNOSIS — M9420 Chondromalacia, unspecified site: Secondary | ICD-10-CM | POA: Diagnosis not present

## 2013-10-18 DIAGNOSIS — R079 Chest pain, unspecified: Secondary | ICD-10-CM | POA: Diagnosis not present

## 2013-10-18 DIAGNOSIS — M1710 Unilateral primary osteoarthritis, unspecified knee: Secondary | ICD-10-CM | POA: Diagnosis not present

## 2013-10-22 ENCOUNTER — Ambulatory Visit (INDEPENDENT_AMBULATORY_CARE_PROVIDER_SITE_OTHER): Payer: Medicare Other | Admitting: Psychology

## 2013-10-22 ENCOUNTER — Encounter: Payer: Self-pay | Admitting: Internal Medicine

## 2013-10-22 ENCOUNTER — Ambulatory Visit (INDEPENDENT_AMBULATORY_CARE_PROVIDER_SITE_OTHER): Payer: Medicare Other | Admitting: Internal Medicine

## 2013-10-22 VITALS — BP 130/62 | HR 66 | Temp 98.1°F | Wt 226.0 lb

## 2013-10-22 DIAGNOSIS — I10 Essential (primary) hypertension: Secondary | ICD-10-CM

## 2013-10-22 DIAGNOSIS — F329 Major depressive disorder, single episode, unspecified: Secondary | ICD-10-CM | POA: Diagnosis not present

## 2013-10-22 DIAGNOSIS — F32A Depression, unspecified: Secondary | ICD-10-CM

## 2013-10-22 DIAGNOSIS — F331 Major depressive disorder, recurrent, moderate: Secondary | ICD-10-CM

## 2013-10-22 DIAGNOSIS — F3289 Other specified depressive episodes: Secondary | ICD-10-CM

## 2013-10-22 DIAGNOSIS — Z23 Encounter for immunization: Secondary | ICD-10-CM | POA: Diagnosis not present

## 2013-10-22 MED ORDER — VENLAFAXINE HCL ER 75 MG PO CP24: 150.0000 mg | ORAL_CAPSULE | Freq: Every day | ORAL | Status: DC

## 2013-10-22 NOTE — Progress Notes (Signed)
Subjective:    Patient ID: [name], female    DOB: [birth year], 68 y.o.   MRN: [id number]  DOS:  10/22/2013 Type of visit - description : Followup from last visit Interval history: Hypertension, atenolol dose was decreased, ambulatory BPs now in the 110, 120s. Depression, started Lexapro, good compliance. Has not noted a major difference. She continue sleeping for 10 or 12 hours a day (" broken sleep ")which is  unusual for her. This started when she got depress per pt.   About 4 weeks ago developed severe pain at the right shoulder blade, worse w/ torso movements, went to see her orthopedic doctor Dr. Eddie Dibbles, x-ray show no fracture per pt, she got local injections.  ROS No fever, chills, substernal chest pain or difficulty taking by mouth No  nausea, vomiting, diarrhea. Occasionally she snores.   Past Medical History  Diagnosis Date  . Hyperlipidemia   . HTN (hypertension)   . Osteoporosis   . Hiatal hernia   . Depression   . Esophageal reflux   . Insomnia   . Barrett's esophagus   . RLS (restless legs syndrome)   . Family history of anesthesia complication     Mother had severe N/V  . Migraine   . DJD (degenerative joint disease)     bilateral hands; knees  . Chronic lower back pain   . Chronic diarrhea   . H/O cardiac catheterization     (-) cath 12-2002  , cath again 2011 (-)  . Vitamin B 12 deficiency 04/09/2013    Past Surgical History  Procedure Laterality Date  . Vaginal hysterectomy      for endometriosis  . Oophorectomy    . Bilateral knee arthroscopy    . Foot surgery      Bilateral; "toenails removed; callus removed on right; hammertoes right"  . Arm surgery      Left; "don't remember what they did; arm wasn't broken"  . Colonoscopy w/ polypectomy    . Cardiac catheterization  01/22/10    clean cath  . Shoulder arthroscopy      right  . Lumbar disc surgery      L5 S1 anterior fusion  . Cataract extraction w/ intraocular lens  implant, bilateral     . Knot      "removed from right neck; not a goiter"  . Total knee arthroplasty  10/05/2011    Procedure: TOTAL KNEE ARTHROPLASTY;  Surgeon: Hessie Dibble, MD;  Location: [facility];  Service: Orthopedics;  Laterality: Right;    History   Social History  . Marital Status: Divorced    Spouse Name: N/A    Number of Children: 1  . Years of Education: N/A   Occupational History  . Retired, post office     Social History Main Topics  . Smoking status: Former Smoker -- 0.50 packs/day for 10 years    Quit date: 02/09/1975  . Smokeless tobacco: Never Used     Comment: started at age 6.   quit in the 1970s.  . Alcohol Use: Yes     Comment: 10/05/2011 "socially;  wine or mixed drink at the most once/month"  . Drug Use: No  . Sexual Activity: Yes   Other Topics Concern  . Not on file   Social History Narrative   Son lives w/ her         Medication List       This list is accurate as of: 10/22/13  1:43 PM.  Always use your most recent med list.               aspirin EC 81 MG tablet  Take 81 mg by mouth daily.     atenolol 25 MG tablet  Commonly known as:  TENORMIN  TAKE 1/2  TABLET (25 MG TOTAL) BY MOUTH DAILY.     benazepril 20 MG tablet  Commonly known as:  LOTENSIN  TAKE 1 TABLET (20 MG TOTAL) BY MOUTH DAILY.     escitalopram 10 MG tablet  Commonly known as:  LEXAPRO  Take 1 tablet (10 mg total) by mouth at bedtime.     HYDROcodone-acetaminophen 10-325 MG per tablet  Commonly known as:  NORCO  Take 1 tablet by mouth every 4 (four) hours as needed for pain. For back pain     MYRBETRIQ 25 MG Tb24 tablet  Generic drug:  mirabegron ER  Take 25 mg by mouth daily.     omeprazole 40 MG capsule  Commonly known as:  PRILOSEC  Take 40 mg by mouth 2 (two) times daily.     pravastatin 40 MG tablet  Commonly known as:  PRAVACHOL  Take 40 mg by mouth daily.     pravastatin 40 MG tablet  Commonly known as:  PRAVACHOL  Take 1 tablet (40 mg total) by mouth daily.      ranitidine 300 MG tablet  Commonly known as:  ZANTAC  Take 300 mg by mouth at bedtime.     rOPINIRole 1 MG tablet  Commonly known as:  REQUIP  Take 1 mg by mouth 2 (two) times daily.     topiramate 200 MG tablet  Commonly known as:  TOPAMAX  Take 400 mg by mouth 2 (two) times daily. Take 1.5 by mouth at bedtime     traZODone 50 MG tablet  Commonly known as:  DESYREL  Take 50 mg by mouth at bedtime.     zoster vaccine live (PF) 19400 UNT/0.65ML injection  Commonly known as:  ZOSTAVAX  Inject 19,400 Units into the skin once.           Objective:   Physical Exam BP 130/62  Pulse 66  Temp(Src) 98.1 F (36.7 C) (Oral)  Wt 226 lb (102.513 kg)  SpO2 97% General -- alert, well-developed, NAD.  Back-- slt TTP between the spine and R shoulder blade, no TTPover the spine  Extremities-- no pretibial edema bilaterally  Neurologic--  alert & oriented X3. Speech normal, gait appropriate for age, strength symmetric and appropriate for age.   Psych-- Cognition and judgment appear intact. Cooperative with normal attention span and concentration. No anxious or depressed appearing.        Assessment & Plan:   Upper back pain, per orthopedic surgery

## 2013-10-22 NOTE — Patient Instructions (Signed)
Stop Lexapro. Start taking Effexor XR  75 mg. One tablet a day for 10 days, then 2 tablets daily in the morning.  Come back in 6 weeks, fasting for a checkup.

## 2013-10-22 NOTE — Assessment & Plan Note (Signed)
Since the last time she was here, we decrease atenolol dose, BP better.

## 2013-10-22 NOTE — Progress Notes (Signed)
Pre visit review using our clinic review tool, if applicable. No additional management support is needed unless otherwise documented below in the visit note. 

## 2013-10-22 NOTE — Assessment & Plan Note (Addendum)
Continue depression, on Lexapro, she also saw one of our counselors. Not improving. Continue with hypersomnolence. Plan:  Switch Lexapro to Effexor to see if that helps hypersomnia. Consider psych referral versus reevaluation by sleep medicine d/t hypersomnia Addendum, her counselor told me  t the patient reported bizarre behavior sometimes (since lexapro)  since we are switching from Lexapro to Effexor will reassess when she comes back

## 2013-10-24 ENCOUNTER — Other Ambulatory Visit: Payer: Self-pay | Admitting: Gastroenterology

## 2013-10-24 ENCOUNTER — Telehealth: Payer: Self-pay | Admitting: Gastroenterology

## 2013-10-24 ENCOUNTER — Other Ambulatory Visit: Payer: Self-pay | Admitting: Internal Medicine

## 2013-10-24 MED ORDER — OMEPRAZOLE 40 MG PO CPDR: 40.0000 mg | DELAYED_RELEASE_CAPSULE | Freq: Two times a day (BID) | ORAL | Status: DC

## 2013-10-24 NOTE — Telephone Encounter (Signed)
One refill sent to patient's pharmacy until scheduled appt. Told patient to keep appt for any further refills.

## 2013-11-06 DIAGNOSIS — R079 Chest pain, unspecified: Secondary | ICD-10-CM | POA: Diagnosis not present

## 2013-11-09 ENCOUNTER — Ambulatory Visit (INDEPENDENT_AMBULATORY_CARE_PROVIDER_SITE_OTHER): Payer: Medicare Other | Admitting: Psychology

## 2013-11-09 DIAGNOSIS — F332 Major depressive disorder, recurrent severe without psychotic features: Secondary | ICD-10-CM | POA: Diagnosis not present

## 2013-11-15 ENCOUNTER — Telehealth: Payer: Self-pay | Admitting: Internal Medicine

## 2013-11-15 DIAGNOSIS — R0781 Pleurodynia: Secondary | ICD-10-CM | POA: Diagnosis not present

## 2013-11-15 MED ORDER — VENLAFAXINE HCL ER 75 MG PO CP24: 150.0000 mg | ORAL_CAPSULE | Freq: Every day | ORAL | Status: DC

## 2013-11-15 NOTE — Telephone Encounter (Signed)
Caller name: Kaina Relation to pt: self Call back number: [phone] Pharmacy: [facility]  Reason for call:   Patient has an appointment later this month and would like a refill of [facility]aline [facility]

## 2013-11-15 NOTE — Telephone Encounter (Signed)
Venlafaxine sent to [facility].

## 2013-11-19 ENCOUNTER — Encounter: Payer: Self-pay | Admitting: Gastroenterology

## 2013-11-19 ENCOUNTER — Ambulatory Visit (INDEPENDENT_AMBULATORY_CARE_PROVIDER_SITE_OTHER): Payer: Medicare Other | Admitting: Gastroenterology

## 2013-11-19 VITALS — BP 122/80 | HR 80 | Ht 65.0 in | Wt 229.8 lb

## 2013-11-19 DIAGNOSIS — K2270 Barrett's esophagus without dysplasia: Secondary | ICD-10-CM | POA: Diagnosis not present

## 2013-11-19 DIAGNOSIS — Z800 Family history of malignant neoplasm of digestive organs: Secondary | ICD-10-CM

## 2013-11-19 MED ORDER — OMEPRAZOLE 40 MG PO CPDR: 40.0000 mg | DELAYED_RELEASE_CAPSULE | Freq: Two times a day (BID) | ORAL | Status: DC

## 2013-11-19 MED ORDER — RANITIDINE HCL 300 MG PO TABS: 300.0000 mg | ORAL_TABLET | Freq: Every day | ORAL | Status: DC

## 2013-11-19 NOTE — Patient Instructions (Signed)
We have sent the following medications to your pharmacy for you to pick up at your convenience:  Omeprazole and Ranitidine  You have recalls in the system for an endoscopy in 06/2017 and a colonoscopy for 11/2014.  You will receive a letter in the mail ahead of time reminding you to call and schedule them.

## 2013-11-19 NOTE — Progress Notes (Signed)
    History of Present Illness: This is a 68 year old female returning for followup of GERD and Barrett's esophagus. In addition she relates intermittent mild diarrhea that is triggered by certain foods. When she avoids these foods she does not note diarrhea.  Current Medications, Allergies, Past Medical History, Past Surgical History, Family History and Social History were reviewed in [employer] record.  Physical Exam: General: Well developed , well nourished, no acute distress Head: Normocephalic and atraumatic Eyes:  sclerae anicteric, EOMI Ears: Normal auditory acuity Mouth: No deformity or lesions Lungs: Clear throughout to auscultation Heart: Regular rate and rhythm; no murmurs, rubs or bruits Abdomen: Soft, non tender and non distended. No masses, hepatosplenomegaly or hernias noted. Normal Bowel sounds Musculoskeletal: Symmetrical with no gross deformities  Pulses:  Normal pulses noted Extremities: No clubbing, cyanosis, edema or deformities noted Neurological: Alert oriented x 4, grossly nonfocal Psychological:  Alert and cooperative. Normal mood and affect  Assessment and Recommendations:   1. Barrett's esophagus and GERD. Continue standard antireflux measures. Continue omeprazole 40 mg every morning and ranitidine 300 mg every afternoon. Five-year surveillance endoscopy is due in May 2019.  2. Family history of colon cancer in [facility] and 2 uncles. Five-year screening colonoscopy is due in October 2016.   3. Diarrhea, mild. Appears to be triggered by certain foods.

## 2013-11-23 ENCOUNTER — Other Ambulatory Visit: Payer: Self-pay

## 2013-11-26 ENCOUNTER — Ambulatory Visit: Payer: Medicare Other | Admitting: Psychology

## 2013-11-26 DIAGNOSIS — N3941 Urge incontinence: Secondary | ICD-10-CM | POA: Diagnosis not present

## 2013-11-26 DIAGNOSIS — N3020 Other chronic cystitis without hematuria: Secondary | ICD-10-CM | POA: Diagnosis not present

## 2013-12-03 ENCOUNTER — Ambulatory Visit: Payer: Medicare Other | Admitting: Internal Medicine

## 2013-12-05 ENCOUNTER — Ambulatory Visit (INDEPENDENT_AMBULATORY_CARE_PROVIDER_SITE_OTHER): Payer: Medicare Other | Admitting: Psychology

## 2013-12-05 DIAGNOSIS — F332 Major depressive disorder, recurrent severe without psychotic features: Secondary | ICD-10-CM

## 2013-12-06 ENCOUNTER — Other Ambulatory Visit: Payer: Self-pay | Admitting: Pulmonary Disease

## 2013-12-06 ENCOUNTER — Encounter: Payer: Self-pay | Admitting: Internal Medicine

## 2013-12-06 ENCOUNTER — Ambulatory Visit (INDEPENDENT_AMBULATORY_CARE_PROVIDER_SITE_OTHER): Payer: Medicare Other | Admitting: Internal Medicine

## 2013-12-06 VITALS — BP 122/81 | HR 86 | Temp 98.0°F | Wt 227.0 lb

## 2013-12-06 DIAGNOSIS — F329 Major depressive disorder, single episode, unspecified: Secondary | ICD-10-CM

## 2013-12-06 DIAGNOSIS — F32A Depression, unspecified: Secondary | ICD-10-CM

## 2013-12-06 DIAGNOSIS — G43C0 Periodic headache syndromes in child or adult, not intractable: Secondary | ICD-10-CM

## 2013-12-06 DIAGNOSIS — E538 Deficiency of other specified B group vitamins: Secondary | ICD-10-CM

## 2013-12-06 MED ORDER — CYANOCOBALAMIN 1000 MCG/ML IJ SOLN
1000.0000 ug | Freq: Once | INTRAMUSCULAR | Status: AC
Start: 2013-12-06 — End: 2013-12-06
  Administered 2013-12-06: 1000 ug via INTRAMUSCULAR

## 2013-12-06 MED ORDER — CYANOCOBALAMIN 1000 MCG/ML IJ SOLN: 1000.0000 ug | Freq: Once | INTRAMUSCULAR | Status: DC

## 2013-12-06 MED ORDER — VENLAFAXINE HCL ER 75 MG PO CP24: 225.0000 mg | ORAL_CAPSULE | Freq: Every day | ORAL | Status: DC

## 2013-12-06 NOTE — Patient Instructions (Signed)
Increase Effexor to 3 tablets daily, you can take them together in the morning Come  back in 6 weeks

## 2013-12-06 NOTE — Assessment & Plan Note (Addendum)
Was switched from Lexapro to Effexor, feels better, still has residual depression. Also continue w/ counseling, they think she may have an element of OCD and anxiety. Hypersomnolence still there, maybe slightly better. As long as she avoids naps, goes to bed at 11 PM and gets 7-8 hours of sleep, she is ok (takes a coffe in AM and able to function well)  Plan-- increase Effexor, reassess  In  6 weeks.

## 2013-12-06 NOTE — Progress Notes (Signed)
Pre visit review using our clinic review tool, if applicable. No additional management support is needed unless otherwise documented below in the visit note. 

## 2013-12-06 NOTE — Assessment & Plan Note (Signed)
Shot today 

## 2013-12-06 NOTE — Progress Notes (Signed)
Subjective:    Patient ID: [name], female    DOB: 18-Jul-1945, 68 y.o.   MRN: [id number]  DOS:  12/06/2013 Type of visit - description : f/u Interval history: Was switch to Effexor, definitely feels better, feels is a better fit for her  than Lexapro. Depression  has decrease but is not still  where it needs to be. She continue with counseling, see assessment.   ROS Continue feeling sleepy throughout the day. She is consciously avoiding to take naps which is great. No suicidal ideas  Past Medical History  Diagnosis Date  . Hyperlipidemia   . HTN (hypertension)   . Osteoporosis   . Hiatal hernia   . Depression   . Esophageal reflux   . Insomnia   . Barrett's esophagus   . RLS (restless legs syndrome)   . Family history of anesthesia complication     Mother had severe N/V  . Migraine   . DJD (degenerative joint disease)     bilateral hands; knees  . Chronic lower back pain   . Chronic diarrhea   . H/O cardiac catheterization     (-) cath 12-2002  , cath again 2011 (-)  . Vitamin B 12 deficiency 04/09/2013    Past Surgical History  Procedure Laterality Date  . Vaginal hysterectomy      for endometriosis  . Oophorectomy    . Bilateral knee arthroscopy    . Foot surgery      Bilateral; "toenails removed; callus removed on right; hammertoes right"  . Arm surgery      Left; "don't remember what they did; arm wasn't broken"  . Colonoscopy w/ polypectomy    . Cardiac catheterization  01/22/10    clean cath  . Shoulder arthroscopy      right  . Lumbar disc surgery      L5 S1 anterior fusion  . Cataract extraction w/ intraocular lens  implant, bilateral    . Knot      "removed from right neck; not a goiter"  . Total knee arthroplasty  10/05/2011    Procedure: TOTAL KNEE ARTHROPLASTY;  Surgeon: Hessie Dibble, MD;  Location: [place];  Service: Orthopedics;  Laterality: Right;    History   Social History  . Marital Status: Divorced    Spouse Name: N/A   Number of Children: 1  . Years of Education: N/A   Occupational History  . Retired, post office     Social History Main Topics  . Smoking status: Former Smoker -- 0.50 packs/day for 10 years    Quit date: 02/09/1975  . Smokeless tobacco: Never Used     Comment: started at age 20.   quit in the 1970s.  . Alcohol Use: Yes     Comment: 10/05/2011 "socially;  wine or mixed drink at the most once/month"  . Drug Use: No  . Sexual Activity: Yes   Other Topics Concern  . Not on file   Social History Narrative   Son lives w/ her         Medication List       This list is accurate as of: 12/06/13  7:57 PM.  Always use your most recent med list.               aspirin EC 81 MG tablet  Take 81 mg by mouth daily.     atenolol 25 MG tablet  Commonly known as:  TENORMIN  TAKE 1/2  TABLET (25  MG TOTAL) BY MOUTH DAILY.     benazepril 20 MG tablet  Commonly known as:  LOTENSIN  TAKE 1 TABLET (20 MG TOTAL) BY MOUTH DAILY.     HYDROcodone-acetaminophen 10-325 MG per tablet  Commonly known as:  NORCO  Take 1 tablet by mouth every 4 (four) hours as needed for pain. For back pain     MYRBETRIQ 25 MG Tb24 tablet  Generic drug:  mirabegron ER  Take 25 mg by mouth daily.     omeprazole 40 MG capsule  Commonly known as:  PRILOSEC  Take 1 capsule (40 mg total) by mouth 2 (two) times daily.     ranitidine 300 MG tablet  Commonly known as:  ZANTAC  Take 1 tablet (300 mg total) by mouth at bedtime.     rOPINIRole 1 MG tablet  Commonly known as:  REQUIP  Take 1 mg by mouth 2 (two) times daily.     topiramate 200 MG tablet  Commonly known as:  TOPAMAX  Take 400 mg by mouth 2 (two) times daily. Take 1.5 by mouth at bedtime     traZODone 50 MG tablet  Commonly known as:  DESYREL  Take 50 mg by mouth at bedtime.     venlafaxine XR 75 MG 24 hr capsule  Commonly known as:  EFFEXOR XR  Take 3 capsules (225 mg total) by mouth daily with breakfast.           Objective:    Physical Exam BP 122/81  Pulse 86  Temp(Src) 98 F (36.7 C) (Oral)  Wt 227 lb (102.967 kg)  SpO2 96% General -- alert, well-developed, NAD.   Neurologic--  alert & oriented X3. Speech normal, gait appropriate for age, strength symmetric and appropriate for age.  Psych-- Cognition and judgment appear intact. Cooperative with normal attention span and concentration. No anxious or depressed appearing.        Assessment & Plan:

## 2013-12-07 ENCOUNTER — Telehealth: Payer: Self-pay | Admitting: Pulmonary Disease

## 2013-12-07 NOTE — Telephone Encounter (Signed)
ATC line busy wcb 

## 2013-12-07 NOTE — Telephone Encounter (Signed)
Dr. Janifer [name] note from 2013 mentions that he wanted her to try to come off trazodone, and no additional mention of trazodone use since then.  She would need to either d/w her PCP for refill or come for evaluation to assess issues with insomnia.

## 2013-12-07 NOTE — Telephone Encounter (Signed)
Spoke with the pt  She is asking for refill on trazodone  I do not see that we ever prescribed this, but I spoke with the pharmacist and was advised that [facility] did prescribe this # 50 tablets on 01/08/13 with no refills  Pt out of med and asking for [place] to call in  VS, please advise if okay to refill since [facility] out of the office

## 2013-12-07 NOTE — Telephone Encounter (Signed)
Called made pt aware of below. Nothing further needed 

## 2013-12-11 DIAGNOSIS — R0781 Pleurodynia: Secondary | ICD-10-CM | POA: Diagnosis not present

## 2013-12-17 ENCOUNTER — Other Ambulatory Visit: Payer: Self-pay | Admitting: Pulmonary Disease

## 2013-12-26 ENCOUNTER — Ambulatory Visit (INDEPENDENT_AMBULATORY_CARE_PROVIDER_SITE_OTHER): Payer: Medicare Other | Admitting: Psychology

## 2013-12-26 ENCOUNTER — Ambulatory Visit (INDEPENDENT_AMBULATORY_CARE_PROVIDER_SITE_OTHER): Payer: Medicare Other

## 2013-12-26 DIAGNOSIS — E538 Deficiency of other specified B group vitamins: Secondary | ICD-10-CM | POA: Diagnosis not present

## 2013-12-26 DIAGNOSIS — F332 Major depressive disorder, recurrent severe without psychotic features: Secondary | ICD-10-CM | POA: Diagnosis not present

## 2013-12-26 MED ORDER — CYANOCOBALAMIN 1000 MCG/ML IJ SOLN
1000.0000 ug | Freq: Once | INTRAMUSCULAR | Status: AC
  Administered 2013-12-26: 1000 ug via INTRAMUSCULAR

## 2013-12-26 NOTE — Progress Notes (Signed)
Patient tolerated injection well.

## 2013-12-27 ENCOUNTER — Ambulatory Visit: Payer: Self-pay

## 2013-12-28 ENCOUNTER — Other Ambulatory Visit: Payer: Self-pay

## 2014-01-07 ENCOUNTER — Other Ambulatory Visit: Payer: Self-pay

## 2014-01-08 DIAGNOSIS — R0781 Pleurodynia: Secondary | ICD-10-CM | POA: Diagnosis not present

## 2014-01-10 ENCOUNTER — Ambulatory Visit: Payer: Self-pay | Admitting: Internal Medicine

## 2014-01-11 ENCOUNTER — Ambulatory Visit (INDEPENDENT_AMBULATORY_CARE_PROVIDER_SITE_OTHER): Payer: Medicare Other | Admitting: Psychology

## 2014-01-11 DIAGNOSIS — F332 Major depressive disorder, recurrent severe without psychotic features: Secondary | ICD-10-CM

## 2014-01-13 ENCOUNTER — Other Ambulatory Visit: Payer: Self-pay | Admitting: Internal Medicine

## 2014-01-15 ENCOUNTER — Encounter: Payer: Self-pay | Admitting: Internal Medicine

## 2014-01-15 ENCOUNTER — Ambulatory Visit (INDEPENDENT_AMBULATORY_CARE_PROVIDER_SITE_OTHER): Payer: Medicare Other | Admitting: Internal Medicine

## 2014-01-15 VITALS — BP 118/80 | HR 80 | Temp 98.5°F | Wt 228.0 lb

## 2014-01-15 DIAGNOSIS — G4700 Insomnia, unspecified: Secondary | ICD-10-CM | POA: Diagnosis not present

## 2014-01-15 DIAGNOSIS — I10 Essential (primary) hypertension: Secondary | ICD-10-CM | POA: Diagnosis not present

## 2014-01-15 DIAGNOSIS — F32A Depression, unspecified: Secondary | ICD-10-CM

## 2014-01-15 DIAGNOSIS — F329 Major depressive disorder, single episode, unspecified: Secondary | ICD-10-CM | POA: Diagnosis not present

## 2014-01-15 MED ORDER — VENLAFAXINE HCL ER 75 MG PO CP24: 225.0000 mg | ORAL_CAPSULE | Freq: Every day | ORAL | Status: DC

## 2014-01-15 MED ORDER — CLONAZEPAM 0.5 MG PO TABS: 0.5000 mg | ORAL_TABLET | Freq: Three times a day (TID) | ORAL | Status: DC | PRN

## 2014-01-15 NOTE — Progress Notes (Signed)
Pre visit review using our clinic review tool, if applicable. No additional management support is needed unless otherwise documented below in the visit note. 

## 2014-01-15 NOTE — Progress Notes (Signed)
Subjective:    Patient ID: [name], female    DOB: 1946/01/09, 68 y.o.   MRN: [id number]  DOS:  01/15/2014 Type of visit - description : f/u Interval history: At the last visit, we increased Effexor to 3 tablets a day. Depression is better. Anxiety is still not well controlled, see assessment and plan. She ran out of trazodone and not sleeping well.   ROS Denies feeling sleepy during the daytime but she is tired, she thinks because she is not sleeping well. Denies any suicidal ideas. Good compliance with medications except that she ran out of Effexor 3 days ago and has been taking only 2 tablets a day since then. "I can tell I'm taking only 2 tablets".   Past Medical History  Diagnosis Date  . Hyperlipidemia   . HTN (hypertension)   . Osteoporosis   . Hiatal hernia   . Depression   . Esophageal reflux   . Insomnia   . Barrett's esophagus   . RLS (restless legs syndrome)   . Family history of anesthesia complication     Mother had severe N/V  . Migraine   . DJD (degenerative joint disease)     bilateral hands; knees  . Chronic lower back pain   . Chronic diarrhea   . H/O cardiac catheterization     (-) cath 12-2002  , cath again 2011 (-)  . Vitamin B 12 deficiency 04/09/2013    Past Surgical History  Procedure Laterality Date  . Vaginal hysterectomy      for endometriosis  . Oophorectomy    . Bilateral knee arthroscopy    . Foot surgery      Bilateral; "toenails removed; callus removed on right; hammertoes right"  . Arm surgery      Left; "don't remember what they did; arm wasn't broken"  . Colonoscopy w/ polypectomy    . Cardiac catheterization  01/22/10    clean cath  . Shoulder arthroscopy      right  . Lumbar disc surgery      L5 S1 anterior fusion  . Cataract extraction w/ intraocular lens  implant, bilateral    . Knot      "removed from right neck; not a goiter"  . Total knee arthroplasty  10/05/2011    Procedure: TOTAL KNEE ARTHROPLASTY;   Surgeon: Hessie Dibble, MD;  Location: [place];  Service: Orthopedics;  Laterality: Right;    History   Social History  . Marital Status: Divorced    Spouse Name: N/A    Number of Children: 1  . Years of Education: N/A   Occupational History  . Retired, post office     Social History Main Topics  . Smoking status: Former Smoker -- 0.50 packs/day for 10 years    Quit date: 02/09/1975  . Smokeless tobacco: Never Used     Comment: started at age 52.   quit in the 1970s.  . Alcohol Use: Yes     Comment: 10/05/2011 "socially;  wine or mixed drink at the most once/month"  . Drug Use: No  . Sexual Activity: Yes   Other Topics Concern  . Not on file   Social History Narrative   Son lives w/ her         Medication List       This list is accurate as of: 01/15/14  5:47 PM.  Always use your most recent med list.  aspirin EC 81 MG tablet  Take 81 mg by mouth daily.     atenolol 25 MG tablet  Commonly known as:  TENORMIN  TAKE 1/2  TABLET (25 MG TOTAL) BY MOUTH DAILY.     benazepril 20 MG tablet  Commonly known as:  LOTENSIN  TAKE 1 TABLET (20 MG TOTAL) BY MOUTH DAILY.     clonazePAM 0.5 MG tablet  Commonly known as:  KLONOPIN  Take 1 tablet (0.5 mg total) by mouth 3 (three) times daily as needed for anxiety.     HYDROcodone-acetaminophen 10-325 MG per tablet  Commonly known as:  NORCO  Take 1 tablet by mouth every 4 (four) hours as needed for pain. For back pain     MYRBETRIQ 25 MG Tb24 tablet  Generic drug:  mirabegron ER  Take 25 mg by mouth daily.     omeprazole 40 MG capsule  Commonly known as:  PRILOSEC  Take 1 capsule (40 mg total) by mouth 2 (two) times daily.     ranitidine 300 MG tablet  Commonly known as:  ZANTAC  Take 1 tablet (300 mg total) by mouth at bedtime.     rOPINIRole 1 MG tablet  Commonly known as:  REQUIP  TAKE 1 TABLET (1 MG TOTAL) BY MOUTH 2 (TWO) TIMES DAILY.     topiramate 200 MG tablet  Commonly known as:   TOPAMAX  Take 400 mg by mouth 2 (two) times daily. Take 1.5 by mouth at bedtime     venlafaxine XR 75 MG 24 hr capsule  Commonly known as:  EFFEXOR XR  Take 3 capsules (225 mg total) by mouth daily with breakfast.           Objective:   Physical Exam BP 118/80 mmHg  Pulse 80  Temp(Src) 98.5 F (36.9 C) (Oral)  Wt 228 lb (103.42 kg)  SpO2 98% General -- alert, well-developed, NAD.  Neurologic--  alert & oriented X3. Speech normal, gait appropriate for age, strength symmetric and appropriate for age.   Psych-- Cognition and judgment appear intact. Cooperative with normal attention span and concentration. Slt anxious but no depressed appearing.        Assessment & Plan:    Depression, Well-controlled with Effexor 3 tablets daily. She has become slightly anxious, no clear triggers although states that some family members "get on her nerves". Plan: Refill Effexor, clonazepam to help with anxiety and insomnia.  Insomnia, Previously well controlled on trazodone, we are adding clonazepam to control anxiety consequently we will discontinue trazodone and use clonazepam for insomnia. If that doesn't work, okay to restart trazodone.  Hypertension, BP well-controlled, no change.

## 2014-01-15 NOTE — Patient Instructions (Signed)
Start clonazepam for anxiety and difficulty sleeping 0.5 or 1 tablet 3 times a day. Watch for excessive somnolence during the daytime. Take the last dose of the day at bedtime  Next visit in 2 months

## 2014-01-15 NOTE — Assessment & Plan Note (Signed)
Insomnia, Previously well controlled on trazodone, we are adding clonazepam to control anxiety consequently we will discontinue trazodone and use clonazepam for insomnia. If that doesn't work, okay to restart trazodone.

## 2014-01-15 NOTE — Assessment & Plan Note (Signed)
Depression, Well-controlled with Effexor 3 tablets daily. She has become slightly anxious, no clear triggers although states that some family members "get on her nerves". Plan: Refill Effexor, clonazepam to help with anxiety and insomnia.

## 2014-01-25 ENCOUNTER — Ambulatory Visit (INDEPENDENT_AMBULATORY_CARE_PROVIDER_SITE_OTHER): Payer: Medicare Other | Admitting: Psychology

## 2014-01-25 DIAGNOSIS — F332 Major depressive disorder, recurrent severe without psychotic features: Secondary | ICD-10-CM

## 2014-01-28 ENCOUNTER — Other Ambulatory Visit: Payer: Self-pay | Admitting: Internal Medicine

## 2014-01-29 DIAGNOSIS — R0781 Pleurodynia: Secondary | ICD-10-CM | POA: Diagnosis not present

## 2014-02-09 ENCOUNTER — Other Ambulatory Visit: Payer: Self-pay | Admitting: Internal Medicine

## 2014-02-11 ENCOUNTER — Other Ambulatory Visit: Payer: Self-pay | Admitting: Gastroenterology

## 2014-02-13 ENCOUNTER — Ambulatory Visit (INDEPENDENT_AMBULATORY_CARE_PROVIDER_SITE_OTHER): Payer: Medicare Other | Admitting: Psychology

## 2014-02-13 DIAGNOSIS — F332 Major depressive disorder, recurrent severe without psychotic features: Secondary | ICD-10-CM

## 2014-03-01 ENCOUNTER — Ambulatory Visit: Payer: Medicare Other | Admitting: Psychology

## 2014-03-13 ENCOUNTER — Ambulatory Visit (INDEPENDENT_AMBULATORY_CARE_PROVIDER_SITE_OTHER): Payer: Medicare Other | Admitting: Psychology

## 2014-03-13 DIAGNOSIS — F332 Major depressive disorder, recurrent severe without psychotic features: Secondary | ICD-10-CM | POA: Diagnosis not present

## 2014-03-18 ENCOUNTER — Encounter: Payer: Self-pay | Admitting: Internal Medicine

## 2014-03-18 ENCOUNTER — Ambulatory Visit (INDEPENDENT_AMBULATORY_CARE_PROVIDER_SITE_OTHER): Payer: Medicare Other | Admitting: Internal Medicine

## 2014-03-18 VITALS — BP 121/77 | HR 78 | Temp 98.1°F | Ht 65.0 in | Wt 236.4 lb

## 2014-03-18 DIAGNOSIS — F329 Major depressive disorder, single episode, unspecified: Secondary | ICD-10-CM | POA: Diagnosis not present

## 2014-03-18 DIAGNOSIS — E538 Deficiency of other specified B group vitamins: Secondary | ICD-10-CM

## 2014-03-18 DIAGNOSIS — F32A Depression, unspecified: Secondary | ICD-10-CM

## 2014-03-18 DIAGNOSIS — Z79891 Long term (current) use of opiate analgesic: Secondary | ICD-10-CM | POA: Diagnosis not present

## 2014-03-18 MED ORDER — CYANOCOBALAMIN 1000 MCG/ML IJ SOLN
1000.0000 ug | Freq: Once | INTRAMUSCULAR | Status: AC
  Administered 2014-03-18: 1000 ug via INTRAMUSCULAR

## 2014-03-18 NOTE — Assessment & Plan Note (Signed)
Depression is controlled with on and off exacerbation, a couple weeks ago she was really angry and have some thoughts about harming somebody. Feeling better the last couple of weeks. No suicidal ideas. Her main concern today is difficulty sleeping, on further questioning, she drinks at least 6 cups of coffee a day, many late in the afternoon. On chart review, several months ago she was on Lexapro and had hypersomnolence. We have a conversation about options: Refer to psychiatry Discontinue Effexor (causing insomnia?) and rx a SSRI After further conversation with agreed to continue with present care and definitely moderate caffeine intake, see instructions. Reassess in few months. Also, will get a UDS  Today , I spent more than 15     min with the patient: >50% of the time counseling regards  treatment options for insomnia

## 2014-03-18 NOTE — Progress Notes (Signed)
Pre visit review using our clinic review tool, if applicable. No additional management support is needed unless otherwise documented below in the visit note. 

## 2014-03-18 NOTE — Progress Notes (Signed)
Subjective:    Patient ID: [name], female    DOB: 1945/05/13, 69 y.o.   MRN: [id number]  DOS:  03/18/2014 Type of visit - description : f/u previous visit Interval history:  Good medication compliance. Mode is okay, still get depressed and angry sometimes. Her main concern today is unable to sleep well, she is able to fall asleep but wakes up frequently during the night. Very seldom gets sleepy during the daytime. Good compliance with clonazepam but that doesn't seem to help any better than trazodone Due for a B12 shot  Review of Systems Denies any suicidal ideas  Past Medical History  Diagnosis Date  . Hyperlipidemia   . HTN (hypertension)   . Osteoporosis   . Hiatal hernia   . Depression   . Esophageal reflux   . Insomnia   . Barrett's esophagus   . RLS (restless legs syndrome)   . Family history of anesthesia complication     Mother had severe N/V  . Migraine   . DJD (degenerative joint disease)     bilateral hands; knees  . Chronic lower back pain   . Chronic diarrhea   . H/O cardiac catheterization     (-) cath 12-2002  , cath again 2011 (-)  . Vitamin B 12 deficiency 04/09/2013    Past Surgical History  Procedure Laterality Date  . Vaginal hysterectomy      for endometriosis  . Oophorectomy    . Bilateral knee arthroscopy    . Foot surgery      Bilateral; "toenails removed; callus removed on right; hammertoes right"  . Arm surgery      Left; "don't remember what they did; arm wasn't broken"  . Colonoscopy w/ polypectomy    . Cardiac catheterization  01/22/10    clean cath  . Shoulder arthroscopy      right  . Lumbar disc surgery      L5 S1 anterior fusion  . Cataract extraction w/ intraocular lens  implant, bilateral    . Knot      "removed from right neck; not a goiter"  . Total knee arthroplasty  10/05/2011    Procedure: TOTAL KNEE ARTHROPLASTY;  Surgeon: Hessie Dibble, MD;  Location: [place];  Service: Orthopedics;  Laterality: Right;     History   Social History  . Marital Status: Divorced    Spouse Name: N/A    Number of Children: 1  . Years of Education: N/A   Occupational History  . Retired, post office     Social History Main Topics  . Smoking status: Former Smoker -- 0.50 packs/day for 10 years    Quit date: 02/09/1975  . Smokeless tobacco: Never Used     Comment: started at age 31.   quit in the 1970s.  . Alcohol Use: Yes     Comment: 10/05/2011 "socially;  wine or mixed drink at the most once/month"  . Drug Use: No  . Sexual Activity: Yes   Other Topics Concern  . Not on file   Social History Narrative   Son lives w/ her         Medication List       This list is accurate as of: 03/18/14  7:13 PM.  Always use your most recent med list.               aspirin EC 81 MG tablet  Take 81 mg by mouth daily.     atenolol 25  MG tablet  Commonly known as:  TENORMIN  TAKE 1/2  TABLET (25 MG TOTAL) BY MOUTH DAILY.     benazepril 20 MG tablet  Commonly known as:  LOTENSIN  TAKE 1 TABLET (20 MG TOTAL) BY MOUTH DAILY.     clonazePAM 0.5 MG tablet  Commonly known as:  KLONOPIN  Take 1 tablet (0.5 mg total) by mouth 3 (three) times daily as needed for anxiety.     HYDROcodone-acetaminophen 10-325 MG per tablet  Commonly known as:  NORCO  Take 1 tablet by mouth every 4 (four) hours as needed for pain. For back pain     MYRBETRIQ 25 MG Tb24 tablet  Generic drug:  mirabegron ER  Take 25 mg by mouth daily.     omeprazole 40 MG capsule  Commonly known as:  PRILOSEC  Take 1 capsule (40 mg total) by mouth 2 (two) times daily.     pravastatin 40 MG tablet  Commonly known as:  PRAVACHOL  TAKE 1 TABLET (40 MG TOTAL) BY MOUTH DAILY.     ranitidine 300 MG tablet  Commonly known as:  ZANTAC  Take 1 tablet (300 mg total) by mouth at bedtime.     rOPINIRole 1 MG tablet  Commonly known as:  REQUIP  TAKE 1 TABLET (1 MG TOTAL) BY MOUTH 2 (TWO) TIMES DAILY.     topiramate 200 MG tablet  Commonly  known as:  TOPAMAX  Take 400 mg by mouth 2 (two) times daily. Take 1.5 by mouth at bedtime     venlafaxine XR 75 MG 24 hr capsule  Commonly known as:  EFFEXOR XR  Take 3 capsules (225 mg total) by mouth daily with breakfast.           Objective:   Physical Exam  Constitutional: She appears well-developed. No distress.  Skin: Skin is warm and dry. She is not diaphoretic. No pallor.  Psychiatric: She has a normal mood and affect. Her behavior is normal. Judgment and thought content normal.         Assessment & Plan:   Problem List Items Addressed This Visit      Other   Depression - Primary    Depression is controlled with on and off exacerbation, a couple weeks ago she was really angry and have some thoughts about harming somebody. Feeling better the last couple of weeks. No suicidal ideas. Her main concern today is difficulty sleeping, on further questioning, she drinks at least 6 cups of coffee a day, many late in the afternoon. On chart review, several months ago she was on Lexapro and had hypersomnolence. We have a conversation about options: Refer to psychiatry Discontinue Effexor (causing insomnia?) and rx a SSRI After further conversation with agreed to continue with present care and definitely moderate caffeine intake, see instructions. Reassess in few months. Also, will get a UDS  Today , I spent more than 15     min with the patient: >50% of the time counseling regards  treatment options for insomnia       Other Visit Diagnoses    B12 deficiency        Relevant Medications    cyanocobalamin ((VITAMIN B-12)) injection 1,000 mcg (Completed)

## 2014-03-18 NOTE — Patient Instructions (Signed)
Please go to the lab provide a urine sample for a UDS  We need to decrease the amount of caffeine you take:  Decrease caffeine intake to 2 cups in the morning and 1 with lunch  After 2 weeks, take only 2 caps with breakfast, no later than 10 AM.  At nighttime you can have a  A caffeine free  infusion if so desired  Come back in 2 months

## 2014-03-25 ENCOUNTER — Telehealth: Payer: Self-pay

## 2014-03-25 ENCOUNTER — Telehealth: Payer: Self-pay | Admitting: Gastroenterology

## 2014-03-25 DIAGNOSIS — M25562 Pain in left knee: Secondary | ICD-10-CM | POA: Diagnosis not present

## 2014-03-25 DIAGNOSIS — S40011A Contusion of right shoulder, initial encounter: Secondary | ICD-10-CM | POA: Diagnosis not present

## 2014-03-25 DIAGNOSIS — M25561 Pain in right knee: Secondary | ICD-10-CM | POA: Diagnosis not present

## 2014-03-25 MED ORDER — OMEPRAZOLE 40 MG PO CPDR: 40.0000 mg | DELAYED_RELEASE_CAPSULE | Freq: Two times a day (BID) | ORAL | Status: DC

## 2014-03-25 NOTE — Telephone Encounter (Signed)
UDS: 03/18/2014  Positive for Klonopin   Low risk per Dr. Larose Kells 03/25/2014

## 2014-03-25 NOTE — Telephone Encounter (Signed)
Left message for patient to call back  

## 2014-03-25 NOTE — Telephone Encounter (Signed)
Patient states that she is taking omeprazole BID per instructions, but the quantity on her rx is only for 30 and asks if we can send a corrected rx.  I have sent this for her

## 2014-03-27 ENCOUNTER — Ambulatory Visit (INDEPENDENT_AMBULATORY_CARE_PROVIDER_SITE_OTHER): Payer: Medicare Other | Admitting: Psychology

## 2014-03-27 DIAGNOSIS — F332 Major depressive disorder, recurrent severe without psychotic features: Secondary | ICD-10-CM

## 2014-04-03 ENCOUNTER — Encounter: Payer: Self-pay | Admitting: Internal Medicine

## 2014-04-10 ENCOUNTER — Ambulatory Visit: Payer: Medicare Other | Admitting: Psychology

## 2014-04-16 ENCOUNTER — Ambulatory Visit (INDEPENDENT_AMBULATORY_CARE_PROVIDER_SITE_OTHER): Payer: Medicare Other | Admitting: *Deleted

## 2014-04-16 DIAGNOSIS — E538 Deficiency of other specified B group vitamins: Secondary | ICD-10-CM

## 2014-04-16 MED ORDER — CYANOCOBALAMIN 1000 MCG/ML IJ SOLN
1000.0000 ug | Freq: Once | INTRAMUSCULAR | Status: AC
  Administered 2014-04-16: 1000 ug via INTRAMUSCULAR

## 2014-04-16 NOTE — Progress Notes (Signed)
Pre visit review using our clinic review tool, if applicable. No additional management support is needed unless otherwise documented below in the visit note.  Patient tolerated injection well.  Next injection previously scheduled.

## 2014-04-18 ENCOUNTER — Other Ambulatory Visit: Payer: Self-pay | Admitting: Internal Medicine

## 2014-04-18 NOTE — Telephone Encounter (Signed)
Last Office Visit 03/18/14 Last refill 01/15/14  #60 with 1 refill. Advise on refill

## 2014-04-19 NOTE — Telephone Encounter (Signed)
Faxed to [facility] pharmacy.

## 2014-04-19 NOTE — Telephone Encounter (Signed)
Rx for [employer]

## 2014-04-19 NOTE — Telephone Encounter (Signed)
fyi

## 2014-04-29 ENCOUNTER — Ambulatory Visit (INDEPENDENT_AMBULATORY_CARE_PROVIDER_SITE_OTHER): Payer: Medicare Other | Admitting: Pulmonary Disease

## 2014-04-29 ENCOUNTER — Encounter: Payer: Self-pay | Admitting: Pulmonary Disease

## 2014-04-29 VITALS — BP 128/92 | HR 72 | Ht 66.0 in | Wt 238.0 lb

## 2014-04-29 DIAGNOSIS — G2581 Restless legs syndrome: Secondary | ICD-10-CM

## 2014-04-29 NOTE — Patient Instructions (Signed)
Stay on requip for your limb movements Please ask your therapist if they do CBT for insomnia (cognitive behavioral therapy) followup with me again in one year if doing well.

## 2014-04-29 NOTE — Assessment & Plan Note (Signed)
Patient continues on Requip for her restless leg syndrome, and feels that it is controlling her symptoms fairly well. Despite this, she is still having issues with sleep onset insomnia despite trying stimulus control therapy. She has now been seeing a therapist for the last 2 months, and she will ask her therapist if she does cognitive behavioral therapy.

## 2014-04-29 NOTE — Progress Notes (Signed)
   Subjective:    Patient ID: [name], female    DOB: [birth year], 69 y.o.   MRN: [id number]  HPI The patient comes in today for follow-up of her restless leg syndrome. She also has sleep onset insomnia, and is currently seeing a behavioral specialist for this. He has been taking her Requip in the afternoon and at bedtime, and feels that her symptoms have been adequately controlled. He will occasionally have some breakthrough, but for the most part has done well.   Review of Systems  Constitutional: Negative for fever, chills and unexpected weight change.  HENT: Negative for congestion, dental problem, ear pain, nosebleeds, postnasal drip, rhinorrhea, sinus pressure, sneezing, sore throat, trouble swallowing and voice change.   Eyes: Negative for visual disturbance.  Respiratory: Negative for cough, choking and shortness of breath.   Cardiovascular: Negative for chest pain and leg swelling.  Gastrointestinal: Negative for vomiting, abdominal pain and diarrhea.  Genitourinary: Negative for difficulty urinating.  Musculoskeletal: Negative for arthralgias.  Skin: Negative for rash.  Neurological: Negative for tremors, syncope and headaches.  Hematological: Does not bruise/bleed easily.       Objective:   Physical Exam Obese female in no acute distress Nose without purulence or discharge noted Neck without lymphadenopathy or thyromegaly Lower extremities with mild edema, no cyanosis Alert, does not appear to be sleepy, moves all 4 extremities.       Assessment & Plan:

## 2014-05-01 ENCOUNTER — Ambulatory Visit (INDEPENDENT_AMBULATORY_CARE_PROVIDER_SITE_OTHER): Payer: Medicare Other | Admitting: Psychology

## 2014-05-01 DIAGNOSIS — F332 Major depressive disorder, recurrent severe without psychotic features: Secondary | ICD-10-CM

## 2014-05-02 ENCOUNTER — Other Ambulatory Visit: Payer: Self-pay | Admitting: Internal Medicine

## 2014-05-15 ENCOUNTER — Ambulatory Visit (INDEPENDENT_AMBULATORY_CARE_PROVIDER_SITE_OTHER): Payer: Medicare Other | Admitting: Psychology

## 2014-05-15 DIAGNOSIS — F332 Major depressive disorder, recurrent severe without psychotic features: Secondary | ICD-10-CM

## 2014-05-24 ENCOUNTER — Ambulatory Visit (INDEPENDENT_AMBULATORY_CARE_PROVIDER_SITE_OTHER): Payer: Medicare Other | Admitting: Internal Medicine

## 2014-05-24 ENCOUNTER — Encounter: Payer: Self-pay | Admitting: Internal Medicine

## 2014-05-24 VITALS — BP 126/78 | HR 67 | Temp 98.0°F | Ht 66.0 in | Wt 238.1 lb

## 2014-05-24 DIAGNOSIS — E785 Hyperlipidemia, unspecified: Secondary | ICD-10-CM | POA: Diagnosis not present

## 2014-05-24 DIAGNOSIS — E538 Deficiency of other specified B group vitamins: Secondary | ICD-10-CM | POA: Diagnosis not present

## 2014-05-24 DIAGNOSIS — F32A Depression, unspecified: Secondary | ICD-10-CM

## 2014-05-24 DIAGNOSIS — I10 Essential (primary) hypertension: Secondary | ICD-10-CM

## 2014-05-24 DIAGNOSIS — F329 Major depressive disorder, single episode, unspecified: Secondary | ICD-10-CM | POA: Diagnosis not present

## 2014-05-24 MED ORDER — CYANOCOBALAMIN 1000 MCG/ML IJ SOLN
1000.0000 ug | Freq: Once | INTRAMUSCULAR | Status: AC
  Administered 2014-05-24: 1000 ug via INTRAMUSCULAR

## 2014-05-24 MED ORDER — VENLAFAXINE HCL ER 75 MG PO CP24: 225.0000 mg | ORAL_CAPSULE | Freq: Every day | ORAL | Status: DC

## 2014-05-24 MED ORDER — PRAVASTATIN SODIUM 40 MG PO TABS: 40.0000 mg | ORAL_TABLET | Freq: Every day | ORAL | Status: DC

## 2014-05-24 MED ORDER — ATENOLOL 25 MG PO TABS: 12.5000 mg | ORAL_TABLET | Freq: Every day | ORAL | Status: DC

## 2014-05-24 MED ORDER — BENAZEPRIL HCL 20 MG PO TABS: 20.0000 mg | ORAL_TABLET | Freq: Every day | ORAL | Status: DC

## 2014-05-24 NOTE — Patient Instructions (Signed)
  Please schedule labs to be done within few days (fasting)  Come back next month for a B12 shot  Come back to the office by 09-2014  for a physical exam  Please schedule an appointment at the front desk    Come back fasting

## 2014-05-24 NOTE — Progress Notes (Signed)
Pre visit review using our clinic review tool, if applicable. No additional management support is needed unless otherwise documented below in the visit note. 

## 2014-05-24 NOTE — Assessment & Plan Note (Addendum)
Currently the patient is well-controlled with Effexor, no suicidal or homicidal ideas. Insomnia improve after she is avoiding caffeine. She does have some symptoms that suggest ADD or OCD, those are chronic sx, their right treatment is to continue with counseling, if she likes to pursue medication treatment, will need to be referred to psychiatry.

## 2014-05-24 NOTE — Progress Notes (Signed)
Subjective:    Patient ID: [name], female    DOB: 11/24/1945, 69 y.o.   MRN: [id number]  DOS:  05/24/2014 Type of visit - description : f/u Interval history: Since the last time she was here, she is taking her medications as recommended. Anxiety depression, symptoms are currently well controlled, she continue to see our counselor. Insomnia, after she changes her caffeine intake, she is sleeping better. Due for a B12 shot    Review of Systems  Reports a long history of difficulty focusing, often times she starts a task and is unable to finish and has several things unfinished at one time. Also is very obsessive, has to clean her floors all the time. Denies any suicidal or homicidal ideas  Past Medical History  Diagnosis Date  . Hyperlipidemia   . HTN (hypertension)   . Osteoporosis   . Hiatal hernia   . Depression   . Esophageal reflux   . Insomnia   . Barrett's esophagus   . RLS (restless legs syndrome)   . Family history of anesthesia complication     Mother had severe N/V  . Migraine   . DJD (degenerative joint disease)     bilateral hands; knees  . Chronic lower back pain   . Chronic diarrhea   . H/O cardiac catheterization     (-) cath 12-2002  , cath again 2011 (-)  . Vitamin B 12 deficiency 04/09/2013    Past Surgical History  Procedure Laterality Date  . Vaginal hysterectomy      for endometriosis  . Oophorectomy    . Bilateral knee arthroscopy    . Foot surgery      Bilateral; "toenails removed; callus removed on right; hammertoes right"  . Arm surgery      Left; "don't remember what they did; arm wasn't broken"  . Colonoscopy w/ polypectomy    . Cardiac catheterization  01/22/10    clean cath  . Shoulder arthroscopy      right  . Lumbar disc surgery      L5 S1 anterior fusion  . Cataract extraction w/ intraocular lens  implant, bilateral    . Knot      "removed from right neck; not a goiter"  . Total knee arthroplasty  10/05/2011   Procedure: TOTAL KNEE ARTHROPLASTY;  Surgeon: Hessie Dibble, MD;  Location: [facility];  Service: Orthopedics;  Laterality: Right;    History   Social History  . Marital Status: Divorced    Spouse Name: N/A  . Number of Children: 1  . Years of Education: N/A   Occupational History  . Retired, post office     Social History Main Topics  . Smoking status: Former Smoker -- 0.50 packs/day for 10 years    Quit date: 02/09/1975  . Smokeless tobacco: Never Used     Comment: started at age 54.   quit in the 1970s.  . Alcohol Use: Yes     Comment: 10/05/2011 "socially;  wine or mixed drink at the most once/month"  . Drug Use: No  . Sexual Activity: Yes   Other Topics Concern  . Not on file   Social History Narrative   Son lives w/ her         Medication List       This list is accurate as of: 05/24/14 11:59 PM.  Always use your most recent med list.  aspirin EC 81 MG tablet  Take 81 mg by mouth daily.     atenolol 25 MG tablet  Commonly known as:  TENORMIN  Take 0.5 tablets (12.5 mg total) by mouth daily.     benazepril 20 MG tablet  Commonly known as:  LOTENSIN  Take 1 tablet (20 mg total) by mouth daily.     clonazePAM 0.5 MG tablet  Commonly known as:  KLONOPIN  TAKE 1 TABLET BY MOUTH 3 TIMES A DAY AS NEEDED FOR ANXIETY     HYDROcodone-acetaminophen 10-325 MG per tablet  Commonly known as:  NORCO  Take 1 tablet by mouth every 4 (four) hours as needed for pain. For back pain     MYRBETRIQ 25 MG Tb24 tablet  Generic drug:  mirabegron ER  Take 25 mg by mouth daily.     omeprazole 40 MG capsule  Commonly known as:  PRILOSEC  Take 1 capsule (40 mg total) by mouth 2 (two) times daily.     pravastatin 40 MG tablet  Commonly known as:  PRAVACHOL  Take 1 tablet (40 mg total) by mouth daily.     ranitidine 300 MG tablet  Commonly known as:  ZANTAC  Take 1 tablet (300 mg total) by mouth at bedtime.     rOPINIRole 1 MG tablet  Commonly known as:   REQUIP  TAKE 1 TABLET (1 MG TOTAL) BY MOUTH 2 (TWO) TIMES DAILY.     topiramate 200 MG tablet  Commonly known as:  TOPAMAX  Take 400 mg by mouth 2 (two) times daily. Take 1.5 by mouth at bedtime     venlafaxine XR 75 MG 24 hr capsule  Commonly known as:  EFFEXOR XR  Take 3 capsules (225 mg total) by mouth daily with breakfast.           Objective:   Physical Exam BP 126/78 mmHg  Pulse 67  Temp(Src) 98 F (36.7 C) (Oral)  Ht 5\' 6"  (1.676 m)  Wt 238 lb 2 oz (108.013 kg)  BMI 38.45 kg/m2  SpO2 98% General:   Well developed, well nourished . NAD.  HEENT:  Normocephalic . Face symmetric, atraumatic Neurologic:  alert & oriented X3.  Speech normal, gait appropriate for age and unassisted Psych--  Cognition and judgment appear intact.  Cooperative with normal attention span and concentration.  Behavior appropriate. No anxious or depressed appearing.        Assessment & Plan:    Hyperlipidemia, refill medications, check FLP, AST, ALT Hypertension, refill Tenormin, Lotensin. Check a BMP

## 2014-05-25 ENCOUNTER — Other Ambulatory Visit: Payer: Self-pay | Admitting: Internal Medicine

## 2014-05-27 ENCOUNTER — Other Ambulatory Visit (INDEPENDENT_AMBULATORY_CARE_PROVIDER_SITE_OTHER): Payer: Medicare Other

## 2014-05-27 DIAGNOSIS — I10 Essential (primary) hypertension: Secondary | ICD-10-CM | POA: Diagnosis not present

## 2014-05-27 DIAGNOSIS — E785 Hyperlipidemia, unspecified: Secondary | ICD-10-CM | POA: Diagnosis not present

## 2014-05-27 LAB — BASIC METABOLIC PANEL
BUN: 19 mg/dL (ref 6–23)
CO2: 22 mEq/L (ref 19–32)
Calcium: 8.8 mg/dL (ref 8.4–10.5)
Chloride: 110 mEq/L (ref 96–112)
Creatinine, Ser: 0.93 mg/dL (ref 0.40–1.20)
GFR: 63.41 mL/min (ref >60.00)
Glucose, Bld: 100 mg/dL — ABNORMAL HIGH (ref 70–99)
Potassium: 4 mEq/L (ref 3.5–5.1)
Sodium: 139 mEq/L (ref 135–145)

## 2014-05-27 LAB — LIPID PANEL
Cholesterol: 163 mg/dL (ref 0–200)
HDL: 54.8 mg/dL (ref >39.00)
LDL Cholesterol: 88 mg/dL (ref 0–99)
NonHDL: 108.2
Total CHOL/HDL Ratio: 3
Triglycerides: 99 mg/dL (ref 0.0–149.0)
VLDL: 19.8 mg/dL (ref 0.0–40.0)

## 2014-05-27 LAB — ALT: ALT: 12 U/L (ref 0–35)

## 2014-05-27 LAB — AST: AST: 15 U/L (ref 0–37)

## 2014-05-29 ENCOUNTER — Other Ambulatory Visit: Payer: Self-pay | Admitting: Pulmonary Disease

## 2014-05-31 ENCOUNTER — Ambulatory Visit (INDEPENDENT_AMBULATORY_CARE_PROVIDER_SITE_OTHER): Payer: Medicare Other | Admitting: Psychology

## 2014-05-31 DIAGNOSIS — F332 Major depressive disorder, recurrent severe without psychotic features: Secondary | ICD-10-CM | POA: Diagnosis not present

## 2014-06-03 DIAGNOSIS — N3941 Urge incontinence: Secondary | ICD-10-CM | POA: Diagnosis not present

## 2014-06-03 DIAGNOSIS — N3020 Other chronic cystitis without hematuria: Secondary | ICD-10-CM | POA: Diagnosis not present

## 2014-06-05 DIAGNOSIS — G43019 Migraine without aura, intractable, without status migrainosus: Secondary | ICD-10-CM | POA: Diagnosis not present

## 2014-06-18 ENCOUNTER — Ambulatory Visit (INDEPENDENT_AMBULATORY_CARE_PROVIDER_SITE_OTHER): Payer: Medicare Other | Admitting: *Deleted

## 2014-06-18 DIAGNOSIS — E538 Deficiency of other specified B group vitamins: Secondary | ICD-10-CM | POA: Diagnosis not present

## 2014-06-18 MED ORDER — CYANOCOBALAMIN 1000 MCG/ML IJ SOLN
1000.0000 ug | Freq: Once | INTRAMUSCULAR | Status: AC
  Administered 2014-06-18: 1000 ug via INTRAMUSCULAR

## 2014-06-18 NOTE — Progress Notes (Signed)
Pre visit review using our clinic review tool, if applicable. No additional management support is needed unless otherwise documented below in the visit note.  Patient tolerated injection well.  Next injection scheduled 07/18/14.

## 2014-06-21 ENCOUNTER — Ambulatory Visit (INDEPENDENT_AMBULATORY_CARE_PROVIDER_SITE_OTHER): Payer: Medicare Other | Admitting: Psychology

## 2014-06-21 ENCOUNTER — Encounter: Payer: Self-pay | Admitting: Internal Medicine

## 2014-06-21 DIAGNOSIS — F332 Major depressive disorder, recurrent severe without psychotic features: Secondary | ICD-10-CM | POA: Diagnosis not present

## 2014-07-05 ENCOUNTER — Ambulatory Visit (INDEPENDENT_AMBULATORY_CARE_PROVIDER_SITE_OTHER): Payer: Medicare Other | Admitting: Internal Medicine

## 2014-07-05 ENCOUNTER — Encounter: Payer: Self-pay | Admitting: Internal Medicine

## 2014-07-05 ENCOUNTER — Other Ambulatory Visit: Payer: Self-pay

## 2014-07-05 VITALS — BP 126/78 | HR 85 | Temp 97.9°F | Ht 66.0 in | Wt 237.0 lb

## 2014-07-05 DIAGNOSIS — N390 Urinary tract infection, site not specified: Secondary | ICD-10-CM | POA: Diagnosis not present

## 2014-07-05 DIAGNOSIS — R350 Frequency of micturition: Secondary | ICD-10-CM

## 2014-07-05 LAB — POCT URINALYSIS DIPSTICK
Bilirubin, UA: NEGATIVE
Blood, UA: NEGATIVE
Glucose, UA: NEGATIVE
Ketones, UA: NEGATIVE
Nitrite, UA: NEGATIVE
Protein, UA: NEGATIVE
Spec Grav, UA: 1.02
Urobilinogen, UA: 0.2
pH, UA: 6.5

## 2014-07-05 NOTE — Progress Notes (Signed)
Pre visit review using our clinic review tool, if applicable. No additional management support is needed unless otherwise documented below in the visit note. 

## 2014-07-05 NOTE — Progress Notes (Signed)
Subjective:    Patient ID: [name], female    DOB: 06-Nov-1945, 69 y.o.   MRN: [id number]  DOS:  07/05/2014 Type of visit - description : acute Interval history: Symptoms started yesterday with urinary frequency, urinating very little every time she goes. Thinks she has a UTI, has not taken any specific meds for her sx   Review of Systems Denies fever chills, appetite normal. No nausea or vomiting, no flank pain. No hematuria or difficulty urinating  Past Medical History  Diagnosis Date  . Hyperlipidemia   . HTN (hypertension)   . Osteoporosis   . Hiatal hernia   . Depression   . Esophageal reflux   . Insomnia   . Barrett's esophagus   . RLS (restless legs syndrome)   . Family history of anesthesia complication     Mother had severe N/V  . Migraine   . DJD (degenerative joint disease)     bilateral hands; knees  . Chronic lower back pain   . Chronic diarrhea   . H/O cardiac catheterization     (-) cath 12-2002  , cath again 2011 (-)  . Vitamin B 12 deficiency 04/09/2013    Past Surgical History  Procedure Laterality Date  . Vaginal hysterectomy      for endometriosis  . Oophorectomy    . Bilateral knee arthroscopy    . Foot surgery      Bilateral; "toenails removed; callus removed on right; hammertoes right"  . Arm surgery      Left; "don't remember what they did; arm wasn't broken"  . Colonoscopy w/ polypectomy    . Cardiac catheterization  01/22/10    clean cath  . Shoulder arthroscopy      right  . Lumbar disc surgery      L5 S1 anterior fusion  . Cataract extraction w/ intraocular lens  implant, bilateral    . Knot      "removed from right neck; not a goiter"  . Total knee arthroplasty  10/05/2011    Procedure: TOTAL KNEE ARTHROPLASTY;  Surgeon: Hessie Dibble, MD;  Location: [facility];  Service: Orthopedics;  Laterality: Right;    History   Social History  . Marital Status: Divorced    Spouse Name: N/A  . Number of Children: 1  . Years of  Education: N/A   Occupational History  . Retired, post office     Social History Main Topics  . Smoking status: Former Smoker -- 0.50 packs/day for 10 years    Quit date: 02/09/1975  . Smokeless tobacco: Never Used     Comment: started at age 46.   quit in the 1970s.  . Alcohol Use: Yes     Comment: 10/05/2011 "socially;  wine or mixed drink at the most once/month"  . Drug Use: No  . Sexual Activity: Yes   Other Topics Concern  . Not on file   Social History Narrative   Son lives w/ her         Medication List       This list is accurate as of: 07/05/14 11:59 PM.  Always use your most recent med list.               aspirin EC 81 MG tablet  Take 81 mg by mouth daily.     atenolol 25 MG tablet  Commonly known as:  TENORMIN  Take 0.5 tablets (12.5 mg total) by mouth daily.     benazepril 20  MG tablet  Commonly known as:  LOTENSIN  Take 1 tablet (20 mg total) by mouth daily.     clonazePAM 0.5 MG tablet  Commonly known as:  KLONOPIN  TAKE 1 TABLET BY MOUTH 3 TIMES A DAY AS NEEDED FOR ANXIETY     HYDROcodone-acetaminophen 10-325 MG per tablet  Commonly known as:  NORCO  Take 1 tablet by mouth every 4 (four) hours as needed for pain. For back pain     MYRBETRIQ 25 MG Tb24 tablet  Generic drug:  mirabegron ER  Take 25 mg by mouth daily.     omeprazole 40 MG capsule  Commonly known as:  PRILOSEC  Take 1 capsule (40 mg total) by mouth 2 (two) times daily.     pravastatin 40 MG tablet  Commonly known as:  PRAVACHOL  Take 1 tablet (40 mg total) by mouth daily.     ranitidine 300 MG tablet  Commonly known as:  ZANTAC  Take 1 tablet (300 mg total) by mouth at bedtime.     rOPINIRole 1 MG tablet  Commonly known as:  REQUIP  TAKE 1 TABLET (1 MG TOTAL) BY MOUTH 2 (TWO) TIMES DAILY.     topiramate 200 MG tablet  Commonly known as:  TOPAMAX  Take 400 mg by mouth 2 (two) times daily. Take 1.5 by mouth at bedtime     venlafaxine XR 75 MG 24 hr capsule  Commonly  known as:  EFFEXOR XR  Take 3 capsules (225 mg total) by mouth daily with breakfast.           Objective:   Physical Exam BP 126/78 mmHg  Pulse 85  Temp(Src) 97.9 F (36.6 C) (Oral)  Ht 5\' 6"  (1.676 m)  Wt 237 lb (107.502 kg)  BMI 38.27 kg/m2  SpO2 98%  General:   Well developed, well nourished . NAD.  HEENT:  Normocephalic . Face symmetric, atraumatic  Abdomen:  Not distended, soft,  slightly tender at the lower abdomen, no rebound or rigidity. No mass,organomegaly Skin: Not pale. Not jaundice Neurologic:  alert & oriented X3.  Speech normal, gait appropriate for age and unassisted Psych--  Cognition and judgment appear intact.  Cooperative with normal attention span and concentration.  Behavior appropriate. No anxious or depressed appearing.      Assessment & Plan:     UTI Udip + leuko Symptoms and Udip c/w UTI, she is extremely concerned because she has a history of protracted C. difficile infection. We talk about start empiric abx versus wait for the urine culture to rx the abx (in a more targeted way)   She strongly prefers to wait for the UCX understanding she may get slightly worse during the next 2 or 3 days. Plan: Fluids, Azo-Standard, cranberry juice, probiotics, ER or urgent care if worse. See instructions

## 2014-07-05 NOTE — Patient Instructions (Signed)
Drink plenty of fluids Drink cranberry juice Take Azo-Standard, a OTC medication for urinary infections for 2 or 3 days  Will wait until   we get a urine culture before prescribe an antibiotic Once you start taking the antibiotic, also take a over-the-counter probiotic  In the meantime if you get worse, have fever chills:  Please go to the urgent care by the [facility] emergency room.

## 2014-07-09 ENCOUNTER — Other Ambulatory Visit: Payer: Self-pay

## 2014-07-12 ENCOUNTER — Ambulatory Visit (INDEPENDENT_AMBULATORY_CARE_PROVIDER_SITE_OTHER): Payer: Medicare Other | Admitting: Psychology

## 2014-07-12 ENCOUNTER — Other Ambulatory Visit (INDEPENDENT_AMBULATORY_CARE_PROVIDER_SITE_OTHER): Payer: Medicare Other

## 2014-07-12 DIAGNOSIS — R8290 Unspecified abnormal findings in urine: Secondary | ICD-10-CM

## 2014-07-12 DIAGNOSIS — R8299 Other abnormal findings in urine: Secondary | ICD-10-CM | POA: Diagnosis not present

## 2014-07-12 DIAGNOSIS — F332 Major depressive disorder, recurrent severe without psychotic features: Secondary | ICD-10-CM | POA: Diagnosis not present

## 2014-07-12 LAB — URINALYSIS, ROUTINE W REFLEX MICROSCOPIC
Bilirubin Urine: NEGATIVE
Hgb urine dipstick: NEGATIVE
Nitrite: POSITIVE — AB
Specific Gravity, Urine: 1.01 (ref 1.000–1.030)
Total Protein, Urine: 100 — AB
Urine Glucose: NEGATIVE
Urobilinogen, UA: >=8 — AB (ref 0.0–1.0)
pH: 6.5 (ref 5.0–8.0)

## 2014-07-15 ENCOUNTER — Other Ambulatory Visit: Payer: Self-pay | Admitting: Internal Medicine

## 2014-07-15 LAB — URINE CULTURE: Colony Count: >=100000

## 2014-07-15 MED ORDER — NITROFURANTOIN MONOHYD MACRO 100 MG PO CAPS: 100.0000 mg | ORAL_CAPSULE | Freq: Two times a day (BID) | ORAL | Status: DC

## 2014-07-18 ENCOUNTER — Ambulatory Visit (INDEPENDENT_AMBULATORY_CARE_PROVIDER_SITE_OTHER): Payer: Medicare Other | Admitting: *Deleted

## 2014-07-18 DIAGNOSIS — E538 Deficiency of other specified B group vitamins: Secondary | ICD-10-CM

## 2014-07-18 MED ORDER — CYANOCOBALAMIN 1000 MCG/ML IJ SOLN
1000.0000 ug | Freq: Once | INTRAMUSCULAR | Status: AC
  Administered 2014-07-18: 1000 ug via INTRAMUSCULAR

## 2014-07-18 NOTE — Progress Notes (Signed)
Pre visit review using our clinic review tool, if applicable. No additional management support is needed unless otherwise documented below in the visit note.  Per lab note:  Notes Recorded by Colon Branch, MD on 09/13/2013 at 6:32 PM Releasing results to [facility] with comments: [facility], your potassium, kidney function, blood count are all normal Vitamin B12 is now normal, continue with the shots  Patient tolerated injection well.  Next injection scheduled 08/16/14.

## 2014-07-26 ENCOUNTER — Ambulatory Visit (INDEPENDENT_AMBULATORY_CARE_PROVIDER_SITE_OTHER): Payer: Medicare Other | Admitting: Psychology

## 2014-07-26 DIAGNOSIS — F332 Major depressive disorder, recurrent severe without psychotic features: Secondary | ICD-10-CM

## 2014-08-09 ENCOUNTER — Ambulatory Visit (INDEPENDENT_AMBULATORY_CARE_PROVIDER_SITE_OTHER): Payer: Medicare Other | Admitting: Psychology

## 2014-08-09 DIAGNOSIS — F332 Major depressive disorder, recurrent severe without psychotic features: Secondary | ICD-10-CM

## 2014-08-16 ENCOUNTER — Telehealth: Payer: Self-pay | Admitting: *Deleted

## 2014-08-16 ENCOUNTER — Ambulatory Visit (INDEPENDENT_AMBULATORY_CARE_PROVIDER_SITE_OTHER): Payer: Medicare Other | Admitting: *Deleted

## 2014-08-16 DIAGNOSIS — E538 Deficiency of other specified B group vitamins: Secondary | ICD-10-CM | POA: Diagnosis not present

## 2014-08-16 MED ORDER — CYANOCOBALAMIN 1000 MCG/ML IJ SOLN
1000.0000 ug | Freq: Once | INTRAMUSCULAR | Status: AC
  Administered 2014-08-16: 1000 ug via INTRAMUSCULAR

## 2014-08-16 NOTE — Telephone Encounter (Signed)
Patient stated that she is not using the bathroom as often and thinks she is getting constipated.  She would like to try a stool softener, or is there something she should get OTC?   Please advise.

## 2014-08-16 NOTE — Telephone Encounter (Signed)
Notified patient who stated understanding.

## 2014-08-16 NOTE — Telephone Encounter (Signed)
Recommend MiraLAX 17 g with fluids every day for 10 days and then as needed

## 2014-08-16 NOTE — Progress Notes (Signed)
Pre visit review using our clinic review tool, if applicable. No additional management support is needed unless otherwise documented below in the visit note. 

## 2014-08-19 ENCOUNTER — Other Ambulatory Visit: Payer: Self-pay | Admitting: Gastroenterology

## 2014-08-22 ENCOUNTER — Other Ambulatory Visit: Payer: Self-pay

## 2014-08-23 ENCOUNTER — Ambulatory Visit (INDEPENDENT_AMBULATORY_CARE_PROVIDER_SITE_OTHER): Payer: Medicare Other | Admitting: Psychology

## 2014-08-23 DIAGNOSIS — F332 Major depressive disorder, recurrent severe without psychotic features: Secondary | ICD-10-CM | POA: Diagnosis not present

## 2014-08-26 ENCOUNTER — Other Ambulatory Visit: Payer: Self-pay | Admitting: Internal Medicine

## 2014-08-26 ENCOUNTER — Telehealth: Payer: Self-pay | Admitting: Internal Medicine

## 2014-08-26 NOTE — Telephone Encounter (Signed)
Pre visit letter mailed 08/23/14 °

## 2014-08-26 NOTE — Telephone Encounter (Signed)
Pt is requesting refill on Clonazepam.  Last OV: 07/05/2014 Last Fill: 04/19/2014 #[id number] 1RF UDS: 03/18/2014 Low risk   Please advise.

## 2014-08-27 NOTE — Telephone Encounter (Signed)
Okay 60 and 4 refills

## 2014-08-27 NOTE — Telephone Encounter (Signed)
Rx faxed to [facility] pharmacy.  

## 2014-08-27 NOTE — Telephone Encounter (Signed)
Rx printed, awaiting MD signature.  

## 2014-09-12 ENCOUNTER — Telehealth: Payer: Self-pay | Admitting: Behavioral Health

## 2014-09-12 ENCOUNTER — Encounter: Payer: Self-pay | Admitting: Behavioral Health

## 2014-09-12 NOTE — Telephone Encounter (Signed)
Pre-Visit Call completed with patient and chart updated.   Pre-Visit Info documented in Specialty Comments under [facility].    

## 2014-09-13 ENCOUNTER — Encounter: Payer: Self-pay | Admitting: Internal Medicine

## 2014-09-13 ENCOUNTER — Ambulatory Visit (INDEPENDENT_AMBULATORY_CARE_PROVIDER_SITE_OTHER): Payer: Medicare Other | Admitting: Internal Medicine

## 2014-09-13 VITALS — BP 124/76 | HR 94 | Temp 97.9°F | Ht 66.0 in | Wt 239.4 lb

## 2014-09-13 DIAGNOSIS — R5382 Chronic fatigue, unspecified: Secondary | ICD-10-CM | POA: Diagnosis not present

## 2014-09-13 DIAGNOSIS — Z0000 Encounter for general adult medical examination without abnormal findings: Secondary | ICD-10-CM

## 2014-09-13 DIAGNOSIS — E538 Deficiency of other specified B group vitamins: Secondary | ICD-10-CM

## 2014-09-13 DIAGNOSIS — F329 Major depressive disorder, single episode, unspecified: Secondary | ICD-10-CM

## 2014-09-13 DIAGNOSIS — I10 Essential (primary) hypertension: Secondary | ICD-10-CM | POA: Diagnosis not present

## 2014-09-13 DIAGNOSIS — Z79899 Other long term (current) drug therapy: Secondary | ICD-10-CM | POA: Diagnosis not present

## 2014-09-13 DIAGNOSIS — E785 Hyperlipidemia, unspecified: Secondary | ICD-10-CM

## 2014-09-13 DIAGNOSIS — M8580 Other specified disorders of bone density and structure, unspecified site: Secondary | ICD-10-CM

## 2014-09-13 DIAGNOSIS — F32A Depression, unspecified: Secondary | ICD-10-CM

## 2014-09-13 LAB — CBC WITH DIFFERENTIAL/PLATELET
Basophils Absolute: 0 10*3/uL (ref 0.0–0.1)
Basophils Relative: 0.4 % (ref 0.0–3.0)
Eosinophils Absolute: 0.1 10*3/uL (ref 0.0–0.7)
Eosinophils Relative: 1.4 % (ref 0.0–5.0)
HCT: 37.4 % (ref 36.0–46.0)
Hemoglobin: 12.6 g/dL (ref 12.0–15.0)
Lymphocytes Relative: 30.9 % (ref 12.0–46.0)
Lymphs Abs: 2.5 10*3/uL (ref 0.7–4.0)
MCHC: 33.6 g/dL (ref 30.0–36.0)
MCV: 90.3 fl (ref 78.0–100.0)
Monocytes Absolute: 0.4 10*3/uL (ref 0.1–1.0)
Monocytes Relative: 5.2 % (ref 3.0–12.0)
Neutro Abs: 5 10*3/uL (ref 1.4–7.7)
Neutrophils Relative %: 62.1 % (ref 43.0–77.0)
Platelets: 248 10*3/uL (ref 150.0–400.0)
RBC: 4.14 Mil/uL (ref 3.87–5.11)
RDW: 13.8 % (ref 11.5–15.5)
WBC: 8 10*3/uL (ref 4.0–10.5)

## 2014-09-13 LAB — TSH: TSH: 1.86 u[IU]/mL (ref 0.35–4.50)

## 2014-09-13 MED ORDER — CYANOCOBALAMIN 1000 MCG/ML IJ SOLN
1000.0000 ug | Freq: Once | INTRAMUSCULAR | Status: AC
  Administered 2014-09-13: 1000 ug via INTRAMUSCULAR

## 2014-09-13 MED ORDER — TRAZODONE HCL 50 MG PO TABS: ORAL_TABLET | ORAL | Status: DC

## 2014-09-13 NOTE — Patient Instructions (Addendum)
Get your blood work before you leave   Try trazodone to help you sleep, if that is not working that me know.  Please consider visit these websites for more information:  www.begintheconversation.org  theconversationproject.org    Fall Prevention and Home Safety Falls cause injuries and can affect all age groups. It is possible to use preventive measures to significantly decrease the likelihood of falls. There are many simple measures which can make your home safer and prevent falls. OUTDOORS  Repair cracks and edges of walkways and driveways.  Remove high doorway thresholds.  Trim shrubbery on the main path into your home.  Have good outside lighting.  Clear walkways of tools, rocks, debris, and clutter.  Check that handrails are not broken and are securely fastened. Both sides of steps should have handrails.  Have leaves, snow, and ice cleared regularly.  Use sand or salt on walkways during winter months.  In the garage, clean up grease or oil spills. BATHROOM  Install night lights.  Install grab bars by the toilet and in the tub and shower.  Use non-skid mats or decals in the tub or shower.  Place a plastic non-slip stool in the shower to sit on, if needed.  Keep floors dry and clean up all water on the floor immediately.  Remove soap buildup in the tub or shower on a regular basis.  Secure bath mats with non-slip, double-sided rug tape.  Remove throw rugs and tripping hazards from the floors. BEDROOMS  Install night lights.  Make sure a bedside light is easy to reach.  Do not use oversized bedding.  Keep a telephone by your bedside.  Have a firm chair with side arms to use for getting dressed.  Remove throw rugs and tripping hazards from the floor. KITCHEN  Keep handles on pots and pans turned toward the center of the stove. Use back burners when possible.  Clean up spills quickly and allow time for drying.  Avoid walking on wet floors.  Avoid  hot utensils and knives.  Position shelves so they are not too high or low.  Place commonly used objects within easy reach.  If necessary, use a sturdy step stool with a grab bar when reaching.  Keep electrical cables out of the way.  Do not use floor polish or wax that makes floors slippery. If you must use wax, use non-skid floor wax.  Remove throw rugs and tripping hazards from the floor. STAIRWAYS  Never leave objects on stairs.  Place handrails on both sides of stairways and use them. Fix any loose handrails. Make sure handrails on both sides of the stairways are as long as the stairs.  Check carpeting to make sure it is firmly attached along stairs. Make repairs to worn or loose carpet promptly.  Avoid placing throw rugs at the top or bottom of stairways, or properly secure the rug with carpet tape to prevent slippage. Get rid of throw rugs, if possible.  Have an electrician put in a light switch at the top and bottom of the stairs. OTHER FALL PREVENTION TIPS  Wear low-heel or rubber-soled shoes that are supportive and fit well. Wear closed toe shoes.  When using a stepladder, make sure it is fully opened and both spreaders are firmly locked. Do not climb a closed stepladder.  Add color or contrast paint or tape to grab bars and handrails in your home. Place contrasting color strips on first and last steps.  Learn and use mobility aids as needed.  Install an electrical emergency response system.  Turn on lights to avoid dark areas. Replace light bulbs that burn out immediately. Get light switches that glow.  Arrange furniture to create clear pathways. Keep furniture in the same place.  Firmly attach carpet with non-skid or double-sided tape.  Eliminate uneven floor surfaces.  Select a carpet pattern that does not visually hide the edge of steps.  Be aware of all pets. OTHER HOME SAFETY TIPS  Set the water temperature for 120 F (48.8 C).  Keep emergency numbers  on or near the telephone.  Keep smoke detectors on every level of the home and near sleeping areas. Document Released: 01/15/2002 Document Revised: 07/27/2011 Document Reviewed: 04/16/2011 [facility] Patient Information 2015 [place], [place]. This information is not intended to replace advice given to you by your health care provider. Make sure you discuss any questions you have with your health care provider.   Preventive Care for Adults Ages 32 and over  Blood pressure check.** / Every 1 to 2 years.  Lipid and cholesterol check.**/ Every 5 years beginning at age 74.  Lung cancer screening. / Every year if you are aged 31-80 years and have a 30-pack-year history of smoking and currently smoke or have quit within the past 15 years. Yearly screening is stopped once you have quit smoking for at least 15 years or develop a health problem that would prevent you from having lung cancer treatment.  Fecal occult blood test (FOBT) of stool. / Every year beginning at age 75 and continuing until age 38. You may not have to do this test if you get a colonoscopy every 10 years.  Flexible sigmoidoscopy** or colonoscopy.** / Every 5 years for a flexible sigmoidoscopy or every 10 years for a colonoscopy beginning at age 42 and continuing until age 27.  Hepatitis C blood test.** / For all people born from [birth year] through 1966 and any individual with known risks for hepatitis C.  Abdominal aortic aneurysm (AAA) screening.** / A one-time screening for ages 53 to 9 years who are current or former smokers.  Skin self-exam. / Monthly.  Influenza vaccine. / Every year.  Tetanus, diphtheria, and acellular pertussis (Tdap/Td) vaccine.** / 1 dose of Td every 10 years.  Varicella vaccine.** / Consult your health care provider.  Zoster vaccine.** / 1 dose for adults aged 55 years or older.  Pneumococcal 13-valent conjugate (PCV13) vaccine.** / Consult your health care provider.  Pneumococcal polysaccharide  (PPSV23) vaccine.** / 1 dose for all adults aged 76 years and older.  Meningococcal vaccine.** / Consult your health care provider.  Hepatitis A vaccine.** / Consult your health care provider.  Hepatitis B vaccine.** / Consult your health care provider.  Haemophilus influenzae type b (Hib) vaccine.** / Consult your health care provider. **Family history and personal history of risk and conditions may change your health care provider's recommendations. Document Released: 03/23/2001 Document Revised: 01/30/2013 Document Reviewed: 06/22/2010 [facility] Patient Information 2015 [place], [place]. This information is not intended to replace advice given to you by your health care provider. Make sure you discuss any questions you have with your health care provider.

## 2014-09-13 NOTE — Assessment & Plan Note (Addendum)
Td 2000 and 2012   Pneumonia shot-- 2012 prevnar 2015 shingles inmunization -- 11-2013  Colonoscopy 03-2006,  Colonoscopy 10-11 , essentially negative,  next  Colonoscopy ~10- 2016 Female care  PAPS per Dr Louanna Raw  Per pt (-)  MMG 2014 (at gyn office). Recommend to call and schedule another mammogram.  counseled about diet and exercise

## 2014-09-13 NOTE — Assessment & Plan Note (Signed)
Bone density test 09-2013 showed osteopenia. Was recommended calcium, vitamin D and stay active

## 2014-09-13 NOTE — Assessment & Plan Note (Signed)
B12 shot today. 

## 2014-09-13 NOTE — Assessment & Plan Note (Signed)
Check a CBC and TSH

## 2014-09-13 NOTE — Assessment & Plan Note (Signed)
Continue statins, last FLP satisfactory

## 2014-09-13 NOTE — Progress Notes (Signed)
Pre visit review using our clinic review tool, if applicable. No additional management support is needed unless otherwise documented below in the visit note. 

## 2014-09-13 NOTE — Progress Notes (Signed)
Subjective:    Patient ID: [name], female    DOB: 06/01/1945, 69 y.o.   MRN: [id number]  DOS:  09/13/2014 Type of visit - description :  Here for Medicare AWV:  1. Risk factors based on Past M, S, F history: reviewed 2. Physical Activities:  not very active 3. Depression/mood: on meds, good-bad days, no suicidal  4. Hearing:   H/o tinnitus, at baseline, s/p ENT eval 5. ADL's: independent, drives  6. Fall Risk: no recent falls, prevention discussed , see AVS 7. home Safety: does feel safe at home  8. Height, weight, & visual acuity: see VS, sees eye doctor regulalrly, due for a visit, encouraged to call 9. Counseling: provided 10. Labs ordered based on risk factors: if needed  11. Referral Coordination: if needed 12. Care Plan, see assessment and plan , written personalized plan provided , see AVS 13. Cognitive Assessment: motor skills and cognition appropriate for age 22. Care team updated, see Snap Shot  15. End-of-life care discussed , see AVS  In addition, today we discussed the following: High cholesterol, good compliance of medication. Anxiety depression: Symptoms well-controlled with medications Insomnia, still having issues GERD: Well controlled most of the time depending on diet. Headaches control it. Hypertension under excellent control in the ambulatory setting, BP today very good. Discontinue some of the medications?     Review of Systems  Constitutional: No fever. No chills. No unexplained wt changes. No unusual sweats  HEENT: No dental problems, no ear discharge, no facial swelling, no voice changes. No eye discharge, no eye  redness , no  intolerance to light   Respiratory: No wheezing , no  difficulty breathing 3 weeks history of episodes of dry cough but denies fever, chills, wheezing, some postnasal dripping. Some hoarseness today only  Cardiovascular: No CP, no leg swelling , no  Palpitations  GI: no nausea, no vomiting, no diarrhea , no   abdominal pain.  No blood in the stools. No dysphagia, no odynophagia    Endocrine: No polyphagia, no polyuria , no polydipsia  GU: No dysuria, gross hematuria, difficulty urinating. No urinary urgency, no frequency.  Musculoskeletal: No joint swellings or unusual aches or pains  Skin: No change in the color of the skin, palor , no  Rash  Allergic, immunologic: No environmental allergies , no  food allergies  Neurological: No dizziness no  syncope. No headaches. No diplopia, no slurred, no slurred speech, no motor deficits, no facial  Numbness  Hematological: No enlarged lymph nodes, no easy bruising , no unusual bleedings  Psychiatry: No suicidal ideas, no hallucinations, no beavior problems, no confusion.   Past Medical History  Diagnosis Date  . Hyperlipidemia   . HTN (hypertension)   . Osteoporosis   . GERD     GERD and HH  . Depression   . Insomnia   . Barrett's esophagus   . RLS (restless legs syndrome)   . Family history of anesthesia complication     Mother had severe N/V  . Migraine   . DJD (degenerative joint disease)     bilateral hands; knees  . Chronic lower back pain   . H/O cardiac catheterization     (-) cath 12-2002  , cath again 2011 (-)  . Vitamin B 12 deficiency 04/09/2013  . C. difficile diarrhea 08/01/2012    severe 2014    Past Surgical History  Procedure Laterality Date  . Vaginal hysterectomy      for endometriosis  .  Oophorectomy    . Bilateral knee arthroscopy    . Foot surgery      Bilateral; "toenails removed; callus removed on right; hammertoes right"  . Arm surgery      Left; "don't remember what they did; arm wasn't broken"  . Colonoscopy w/ polypectomy    . Cardiac catheterization  01/22/10    clean cath  . Shoulder arthroscopy      right  . Lumbar disc surgery      L5 S1 anterior fusion  . Cataract extraction w/ intraocular lens  implant, bilateral    . Knot      "removed from right neck; not a goiter"  . Total knee  arthroplasty  10/05/2011    Procedure: TOTAL KNEE ARTHROPLASTY;  Surgeon: Hessie Dibble, MD;  Location: [facility];  Service: Orthopedics;  Laterality: Right;    History   Social History  . Marital Status: Divorced    Spouse Name: N/A  . Number of Children: 1  . Years of Education: N/A   Occupational History  . Retired, post office     Social History Main Topics  . Smoking status: Former Smoker -- 0.50 packs/day for 10 years    Quit date: 02/09/1975  . Smokeless tobacco: Never Used     Comment: started at age 57.   quit in the 1970s.  . Alcohol Use: Yes     Comment: 10/05/2011 "socially;  wine or mixed drink at the most once/month"  . Drug Use: No  . Sexual Activity: Yes   Other Topics Concern  . Not on file   Social History Narrative   Son lives w/ her      Family History  Problem Relation Age of Onset  . Colon cancer Maternal Grandfather   . Lung cancer Mother   . Esophageal cancer Brother   . Breast cancer Sister   . Diabetes Other     Aunt  . Dementia Father   . Colon cancer Maternal Uncle     2  . Rectal cancer Neg Hx   . Stomach cancer Neg Hx   . CAD Father     dx in his 51s  . Stroke Father        Medication List       This list is accurate as of: 09/13/14  5:31 PM.  Always use your most recent med list.               aspirin EC 81 MG tablet  Take 81 mg by mouth daily.     atenolol 25 MG tablet  Commonly known as:  TENORMIN  Take 0.5 tablets (12.5 mg total) by mouth daily.     benazepril 20 MG tablet  Commonly known as:  LOTENSIN  Take 1 tablet (20 mg total) by mouth daily.     clonazePAM 0.5 MG tablet  Commonly known as:  KLONOPIN  Take 1 tablet (0.5 mg total) by mouth 3 (three) times daily as needed for anxiety.     HYDROcodone-acetaminophen 10-325 MG per tablet  Commonly known as:  NORCO  Take 1 tablet by mouth every 4 (four) hours as needed for pain. For back pain     MYRBETRIQ 25 MG Tb24 tablet  Generic drug:  mirabegron ER  Take  25 mg by mouth daily.     omeprazole 40 MG capsule  Commonly known as:  PRILOSEC  Take 1 capsule (40 mg total) by mouth 2 (two) times daily.  pravastatin 40 MG tablet  Commonly known as:  PRAVACHOL  Take 1 tablet (40 mg total) by mouth daily.     ranitidine 300 MG tablet  Commonly known as:  ZANTAC  TAKE 1 TABLET BY MOUTH AT BEDTIME     rOPINIRole 1 MG tablet  Commonly known as:  REQUIP  TAKE 1 TABLET (1 MG TOTAL) BY MOUTH 2 (TWO) TIMES DAILY.     topiramate 200 MG tablet  Commonly known as:  TOPAMAX  Take 400 mg by mouth 2 (two) times daily. Take 1.5 by mouth at bedtime     traZODone 50 MG tablet  Commonly known as:  DESYREL  TAKE 1-2 TABLETS (50-100 MG TOTAL) BY MOUTH AT BEDTIME.     venlafaxine XR 75 MG 24 hr capsule  Commonly known as:  EFFEXOR XR  Take 3 capsules (225 mg total) by mouth daily with breakfast.           Objective:   Physical Exam BP 124/76 mmHg  Pulse 94  Temp(Src) 97.9 F (36.6 C) (Oral)  Ht 5\' 6"  (1.676 m)  Wt 239 lb 6 oz (108.58 kg)  BMI 38.65 kg/m2  SpO2 97% General:   Well developed, well nourished . NAD.  HEENT:  Normocephalic . Face symmetric, atraumatic. TMs normal, throat symmetric without redness. Lungs:  CTA B Normal respiratory effort, no intercostal retractions, no accessory muscle use. Heart: RRR,  no murmur.  no pretibial edema bilaterally  Abdomen:  Not distended, soft, non-tender. No rebound or rigidity. No mass,organomegaly Skin: Not pale. Not jaundice Neurologic:  alert & oriented X3.  Speech normal, gait appropriate for age and unassisted Psych--  Cognition and judgment appear intact.  Cooperative with normal attention span and concentration.  Behavior appropriate. No anxious or depressed appearing.     Assessment & Plan:   Cough: No clear etiology, recommend observation for now

## 2014-09-13 NOTE — Assessment & Plan Note (Signed)
Well-controlled, recent BMP satisfactory.

## 2014-09-13 NOTE — Assessment & Plan Note (Addendum)
Depression: Well controlled on Effexor Insomnia: Still an issue, used to responded very well with trazodone, will restart

## 2014-09-18 DIAGNOSIS — G5602 Carpal tunnel syndrome, left upper limb: Secondary | ICD-10-CM | POA: Diagnosis not present

## 2014-09-18 DIAGNOSIS — G5601 Carpal tunnel syndrome, right upper limb: Secondary | ICD-10-CM | POA: Diagnosis not present

## 2014-09-18 DIAGNOSIS — M1811 Unilateral primary osteoarthritis of first carpometacarpal joint, right hand: Secondary | ICD-10-CM | POA: Diagnosis not present

## 2014-09-18 DIAGNOSIS — M1812 Unilateral primary osteoarthritis of first carpometacarpal joint, left hand: Secondary | ICD-10-CM | POA: Diagnosis not present

## 2014-09-20 ENCOUNTER — Ambulatory Visit (INDEPENDENT_AMBULATORY_CARE_PROVIDER_SITE_OTHER): Payer: Medicare Other | Admitting: Psychology

## 2014-09-20 DIAGNOSIS — F332 Major depressive disorder, recurrent severe without psychotic features: Secondary | ICD-10-CM

## 2014-09-24 DIAGNOSIS — G43019 Migraine without aura, intractable, without status migrainosus: Secondary | ICD-10-CM | POA: Diagnosis not present

## 2014-09-24 DIAGNOSIS — G43719 Chronic migraine without aura, intractable, without status migrainosus: Secondary | ICD-10-CM | POA: Diagnosis not present

## 2014-10-01 ENCOUNTER — Encounter: Payer: Self-pay | Admitting: Gastroenterology

## 2014-10-02 DIAGNOSIS — G5601 Carpal tunnel syndrome, right upper limb: Secondary | ICD-10-CM | POA: Diagnosis not present

## 2014-10-02 DIAGNOSIS — G5602 Carpal tunnel syndrome, left upper limb: Secondary | ICD-10-CM | POA: Diagnosis not present

## 2014-10-02 DIAGNOSIS — M1812 Unilateral primary osteoarthritis of first carpometacarpal joint, left hand: Secondary | ICD-10-CM | POA: Diagnosis not present

## 2014-10-02 DIAGNOSIS — M1811 Unilateral primary osteoarthritis of first carpometacarpal joint, right hand: Secondary | ICD-10-CM | POA: Diagnosis not present

## 2014-10-04 ENCOUNTER — Ambulatory Visit (INDEPENDENT_AMBULATORY_CARE_PROVIDER_SITE_OTHER): Payer: Medicare Other | Admitting: Psychology

## 2014-10-04 ENCOUNTER — Telehealth: Payer: Self-pay

## 2014-10-04 DIAGNOSIS — F332 Major depressive disorder, recurrent severe without psychotic features: Secondary | ICD-10-CM

## 2014-10-04 NOTE — Telephone Encounter (Signed)
UDS: 09/13/2014  Positive for Clonazepam   Low risk per Dr. Larose Kells 10/03/2014

## 2014-10-08 ENCOUNTER — Encounter: Payer: Self-pay | Admitting: Gastroenterology

## 2014-10-08 DIAGNOSIS — G518 Other disorders of facial nerve: Secondary | ICD-10-CM | POA: Diagnosis not present

## 2014-10-08 DIAGNOSIS — R51 Headache: Secondary | ICD-10-CM | POA: Diagnosis not present

## 2014-10-08 DIAGNOSIS — M791 Myalgia: Secondary | ICD-10-CM | POA: Diagnosis not present

## 2014-10-08 DIAGNOSIS — G43719 Chronic migraine without aura, intractable, without status migrainosus: Secondary | ICD-10-CM | POA: Diagnosis not present

## 2014-10-08 DIAGNOSIS — G43019 Migraine without aura, intractable, without status migrainosus: Secondary | ICD-10-CM | POA: Diagnosis not present

## 2014-10-08 DIAGNOSIS — M542 Cervicalgia: Secondary | ICD-10-CM | POA: Diagnosis not present

## 2014-10-18 ENCOUNTER — Ambulatory Visit (INDEPENDENT_AMBULATORY_CARE_PROVIDER_SITE_OTHER): Payer: Medicare Other | Admitting: Psychology

## 2014-10-18 DIAGNOSIS — F332 Major depressive disorder, recurrent severe without psychotic features: Secondary | ICD-10-CM | POA: Diagnosis not present

## 2014-10-24 DIAGNOSIS — Z1289 Encounter for screening for malignant neoplasm of other sites: Secondary | ICD-10-CM | POA: Diagnosis not present

## 2014-10-24 DIAGNOSIS — Z1231 Encounter for screening mammogram for malignant neoplasm of breast: Secondary | ICD-10-CM | POA: Diagnosis not present

## 2014-10-24 LAB — HM PAP SMEAR

## 2014-10-24 LAB — HM MAMMOGRAPHY

## 2014-10-25 ENCOUNTER — Other Ambulatory Visit: Payer: Self-pay

## 2014-10-25 MED ORDER — VENLAFAXINE HCL ER 75 MG PO CP24: 225.0000 mg | ORAL_CAPSULE | Freq: Every day | ORAL | Status: DC

## 2014-10-30 ENCOUNTER — Encounter: Payer: Self-pay | Admitting: Internal Medicine

## 2014-10-30 ENCOUNTER — Ambulatory Visit (INDEPENDENT_AMBULATORY_CARE_PROVIDER_SITE_OTHER): Payer: Medicare Other | Admitting: Internal Medicine

## 2014-10-30 VITALS — BP 128/74 | HR 82 | Temp 98.2°F | Ht 66.0 in | Wt 238.2 lb

## 2014-10-30 DIAGNOSIS — R42 Dizziness and giddiness: Secondary | ICD-10-CM

## 2014-10-30 DIAGNOSIS — Z09 Encounter for follow-up examination after completed treatment for conditions other than malignant neoplasm: Secondary | ICD-10-CM | POA: Diagnosis not present

## 2014-10-30 DIAGNOSIS — J0100 Acute maxillary sinusitis, unspecified: Secondary | ICD-10-CM

## 2014-10-30 MED ORDER — AZELASTINE HCL 0.1 % NA SOLN: 2.0000 | Freq: Every evening | NASAL | Status: DC | PRN

## 2014-10-30 MED ORDER — HYDROCODONE-HOMATROPINE 5-1.5 MG/5ML PO SYRP: 5.0000 mL | ORAL_SOLUTION | Freq: Four times a day (QID) | ORAL | Status: DC | PRN

## 2014-10-30 MED ORDER — AZITHROMYCIN 250 MG PO TABS: ORAL_TABLET | ORAL | Status: DC

## 2014-10-30 MED ORDER — BENZONATATE 200 MG PO CAPS: 200.0000 mg | ORAL_CAPSULE | Freq: Two times a day (BID) | ORAL | Status: DC | PRN

## 2014-10-30 NOTE — Progress Notes (Signed)
Subjective:    Patient ID: [name], female    DOB: 25-Nov-1945, 69 y.o.   MRN: [id number]  DOS:  10/30/2014 Type of visit - description : Acute visit Interval history: Symptoms started 5 days ago with a frontal headache, "teeth hurting ", sore throat, mild cough, ear ache. The cough become much more noticeable 2 days ago, it is dry.  Review of Systems  + Subjective fever, clear runny nose. Anterior chest pain only with cough. No nausea, vomiting, diarrhea. No generalized myalgias or rash. No chest rattling or congestion.  Past Medical History  Diagnosis Date  . Hyperlipidemia   . HTN (hypertension)   . Osteoporosis   . GERD     GERD and HH  . Depression   . Insomnia   . Barrett's esophagus   . RLS (restless legs syndrome)   . Family history of anesthesia complication     Mother had severe N/V  . Migraine   . DJD (degenerative joint disease)     bilateral hands; knees  . Chronic lower back pain   . H/O cardiac catheterization     (-) cath 12-2002  , cath again 2011 (-)  . Vitamin B 12 deficiency 04/09/2013  . C. difficile diarrhea 08/01/2012    severe 2014  . Bilateral carpal tunnel syndrome     Dr. Eddie Dibbles, having injection therapy    Past Surgical History  Procedure Laterality Date  . Vaginal hysterectomy      for endometriosis  . Oophorectomy    . Bilateral knee arthroscopy    . Foot surgery      Bilateral; "toenails removed; callus removed on right; hammertoes right"  . Arm surgery      Left; "don't remember what they did; arm wasn't broken"  . Colonoscopy w/ polypectomy    . Cardiac catheterization  01/22/10    clean cath  . Shoulder arthroscopy      right  . Lumbar disc surgery      L5 S1 anterior fusion  . Cataract extraction w/ intraocular lens  implant, bilateral    . Knot      "removed from right neck; not a goiter"  . Total knee arthroplasty  10/05/2011    Procedure: TOTAL KNEE ARTHROPLASTY;  Surgeon: Hessie Dibble, MD;  Location: [facility];   Service: Orthopedics;  Laterality: Right;    Social History   Social History  . Marital Status: Divorced    Spouse Name: N/A  . Number of Children: 1  . Years of Education: N/A   Occupational History  . Retired, post office     Social History Main Topics  . Smoking status: Former Smoker -- 0.50 packs/day for 10 years    Quit date: 02/09/1975  . Smokeless tobacco: Never Used     Comment: started at age 75.   quit in the 1970s.  . Alcohol Use: Yes     Comment: 10/05/2011 "socially;  wine or mixed drink at the most once/month"  . Drug Use: No  . Sexual Activity: Yes   Other Topics Concern  . Not on file   Social History Narrative   Son lives w/ her         Medication List       This list is accurate as of: 10/30/14 11:59 PM.  Always use your most recent med list.               aspirin EC 81 MG tablet  Take  81 mg by mouth daily.     atenolol 25 MG tablet  Commonly known as:  TENORMIN  Take 0.5 tablets (12.5 mg total) by mouth daily.     azelastine 0.1 % nasal spray  Commonly known as:  ASTELIN  Place 2 sprays into both nostrils at bedtime as needed for rhinitis. Use in each nostril as directed     azithromycin 250 MG tablet  Commonly known as:  ZITHROMAX Z-PAK  2 tabs a day the first day, then 1 tab a day x 4 days     benazepril 20 MG tablet  Commonly known as:  LOTENSIN  Take 1 tablet (20 mg total) by mouth daily.     benzonatate 200 MG capsule  Commonly known as:  TESSALON  Take 1 capsule (200 mg total) by mouth 2 (two) times daily as needed for cough.     clonazePAM 0.5 MG tablet  Commonly known as:  KLONOPIN  Take 1 tablet (0.5 mg total) by mouth 3 (three) times daily as needed for anxiety.     HYDROcodone-acetaminophen 10-325 MG per tablet  Commonly known as:  NORCO  Take 1 tablet by mouth every 4 (four) hours as needed for pain. For back pain     HYDROcodone-homatropine 5-1.5 MG/5ML syrup  Commonly known as:  HYCODAN  Take 5 mLs by mouth  every 6 (six) hours as needed for cough.     MYRBETRIQ 25 MG Tb24 tablet  Generic drug:  mirabegron ER  Take 25 mg by mouth daily.     omeprazole 40 MG capsule  Commonly known as:  PRILOSEC  Take 1 capsule (40 mg total) by mouth 2 (two) times daily.     pravastatin 40 MG tablet  Commonly known as:  PRAVACHOL  Take 1 tablet (40 mg total) by mouth daily.     ranitidine 300 MG tablet  Commonly known as:  ZANTAC  TAKE 1 TABLET BY MOUTH AT BEDTIME     rOPINIRole 1 MG tablet  Commonly known as:  REQUIP  TAKE 1 TABLET (1 MG TOTAL) BY MOUTH 2 (TWO) TIMES DAILY.     topiramate 200 MG tablet  Commonly known as:  TOPAMAX  Take 400 mg by mouth 2 (two) times daily. Take 1.5 by mouth at bedtime     traZODone 50 MG tablet  Commonly known as:  DESYREL  TAKE 1-2 TABLETS (50-100 MG TOTAL) BY MOUTH AT BEDTIME.     venlafaxine XR 75 MG 24 hr capsule  Commonly known as:  EFFEXOR XR  Take 3 capsules (225 mg total) by mouth daily with breakfast.           Objective:   Physical Exam BP 128/74 mmHg  Pulse 82  Temp(Src) 98.2 F (36.8 C) (Oral)  Ht 5\' 6"  (1.676 m)  Wt 238 lb 4 oz (108.069 kg)  BMI 38.47 kg/m2  SpO2 97% General:   Well developed, well nourished . NAD.  HEENT:  Normocephalic . Face symmetric, atraumatic. TMs normal, throat symmetric, no red. Nose quite congested, clear discharge noted. Sinuses: Mild TTP on the right maxillary area, moderate TTP on the left maxillary area. Frontal sinuses not TTP Lungs:  CTA B Normal respiratory effort, no intercostal retractions, no accessory muscle use. Heart: RRR,  no murmur.  No pretibial edema bilaterally  Skin: Not pale. Not jaundice Neurologic:  alert & oriented X3.  Speech normal, gait appropriate for age and unassisted Psych--  Cognition and judgment appear intact.  Cooperative with  normal attention span and concentration.  Behavior appropriate. No anxious or depressed appearing.      Assessment & Plan:    Assessment> HTN Hyperlipidemia Depression, insomnia GI:  --GERD, Barrett's esophagus --Persistent C. difficile diarrhea 2014 B12 deficiency Osteoporosis RLS MSK: --DJD --Carpal tunnel syndrome bilaterally --Chronic low back pain Cardiac catheterization 2011 and 2014 negative  Plan: Sinusitis: Symptoms started 5 days ago, viral versus bacterial sinusitis which is less likely. We'll try conservative treatment keeping in mind that she is allergic to Mucinex DM and penicillin. If she's not better, will start on antibiotics. She has a history of C. difficile diarrhea in 2014 so again will start antibiotics only if not improving. If she decides to do abx, needs a probiotic as well

## 2014-10-30 NOTE — Patient Instructions (Signed)
Rest, fluids , tylenol  For cough: Take Tessalon Perles twice a day as needed  If the cough continue, take the hydrocodone liquid medication as needed. Will cause drowsiness, do not take your pain medication hydrocodone if you take this liquid.   For nasal congestion  Use OTC flonase : 2 nasal sprays on each side of the nose daily until you feel better Also, get the prescribe it Astelin nasal spray, 2 sprays on each side of the nose at bedtime   Take the antibiotic as prescribed  (zpack) only if not improving in the next 3 or 4 days or if you have severe symptoms  Call if not gradually better over the next  10 days  Call anytime if the symptoms are severe

## 2014-10-30 NOTE — Progress Notes (Signed)
Pre visit review using our clinic review tool, if applicable. No additional management support is needed unless otherwise documented below in the visit note. 

## 2014-10-31 DIAGNOSIS — Z09 Encounter for follow-up examination after completed treatment for conditions other than malignant neoplasm: Secondary | ICD-10-CM | POA: Insufficient documentation

## 2014-10-31 NOTE — Assessment & Plan Note (Signed)
Sinusitis: Symptoms started 5 days ago, viral versus bacterial sinusitis which is less likely. We'll try conservative treatment keeping in mind that she is allergic to Mucinex DM and penicillin. If she's not better, will start on antibiotics. She has a history of C. difficile diarrhea in 2014 so again will start antibiotics only if not improving. If she decides to do abx, needs a probiotic as well

## 2014-11-01 ENCOUNTER — Ambulatory Visit: Payer: Medicare Other | Admitting: Psychology

## 2014-11-08 ENCOUNTER — Other Ambulatory Visit: Payer: Self-pay | Admitting: Internal Medicine

## 2014-11-08 NOTE — Telephone Encounter (Signed)
Rx sent 

## 2014-11-08 NOTE — Telephone Encounter (Signed)
Pt is requesting refill on Trazodone.  Last OV: 10/30/2014  Last Fill: 09/13/2014 #60 1RF UDS: 09/13/2014 Low risk  Please advise.

## 2014-11-08 NOTE — Telephone Encounter (Signed)
Okay #60 and 5 refills 

## 2014-11-11 ENCOUNTER — Other Ambulatory Visit: Payer: Self-pay

## 2014-11-11 MED ORDER — RANITIDINE HCL 300 MG PO TABS: 300.0000 mg | ORAL_TABLET | Freq: Every day | ORAL | Status: DC

## 2014-11-11 NOTE — Telephone Encounter (Signed)
Patient has a Colonoscopy scheduled in November and needs a refill of ranitidine. Medication sent to patient's pharmacy.

## 2014-11-13 DIAGNOSIS — G43019 Migraine without aura, intractable, without status migrainosus: Secondary | ICD-10-CM | POA: Diagnosis not present

## 2014-11-13 DIAGNOSIS — G43719 Chronic migraine without aura, intractable, without status migrainosus: Secondary | ICD-10-CM | POA: Diagnosis not present

## 2014-11-13 DIAGNOSIS — G518 Other disorders of facial nerve: Secondary | ICD-10-CM | POA: Diagnosis not present

## 2014-11-13 DIAGNOSIS — M542 Cervicalgia: Secondary | ICD-10-CM | POA: Diagnosis not present

## 2014-11-13 DIAGNOSIS — R51 Headache: Secondary | ICD-10-CM | POA: Diagnosis not present

## 2014-11-13 DIAGNOSIS — M791 Myalgia: Secondary | ICD-10-CM | POA: Diagnosis not present

## 2014-11-15 ENCOUNTER — Ambulatory Visit (INDEPENDENT_AMBULATORY_CARE_PROVIDER_SITE_OTHER): Payer: Medicare Other | Admitting: Psychology

## 2014-11-15 DIAGNOSIS — F332 Major depressive disorder, recurrent severe without psychotic features: Secondary | ICD-10-CM

## 2014-11-25 ENCOUNTER — Ambulatory Visit (INDEPENDENT_AMBULATORY_CARE_PROVIDER_SITE_OTHER): Payer: Medicare Other | Admitting: Psychology

## 2014-11-25 DIAGNOSIS — F332 Major depressive disorder, recurrent severe without psychotic features: Secondary | ICD-10-CM

## 2014-12-02 DIAGNOSIS — R51 Headache: Secondary | ICD-10-CM | POA: Diagnosis not present

## 2014-12-02 DIAGNOSIS — G43719 Chronic migraine without aura, intractable, without status migrainosus: Secondary | ICD-10-CM | POA: Diagnosis not present

## 2014-12-02 DIAGNOSIS — G518 Other disorders of facial nerve: Secondary | ICD-10-CM | POA: Diagnosis not present

## 2014-12-02 DIAGNOSIS — M791 Myalgia: Secondary | ICD-10-CM | POA: Diagnosis not present

## 2014-12-02 DIAGNOSIS — M542 Cervicalgia: Secondary | ICD-10-CM | POA: Diagnosis not present

## 2014-12-02 DIAGNOSIS — G43019 Migraine without aura, intractable, without status migrainosus: Secondary | ICD-10-CM | POA: Diagnosis not present

## 2014-12-03 ENCOUNTER — Ambulatory Visit (AMBULATORY_SURGERY_CENTER): Payer: Self-pay | Admitting: *Deleted

## 2014-12-03 ENCOUNTER — Telehealth: Payer: Self-pay | Admitting: *Deleted

## 2014-12-03 VITALS — Ht 66.0 in | Wt 242.2 lb

## 2014-12-03 DIAGNOSIS — Z800 Family history of malignant neoplasm of digestive organs: Secondary | ICD-10-CM

## 2014-12-03 MED ORDER — NA SULFATE-K SULFATE-MG SULF 17.5-3.13-1.6 GM/177ML PO SOLN: ORAL | Status: DC

## 2014-12-03 NOTE — Telephone Encounter (Signed)
Dr. Fuller Plan and Jenny Reichmann or [facility],  Just wanted to make you aware of this- pt had her PV today. Pt states that "my neck is a mess.  The nerves is my neck are so messed up.  It's really hard to move my neck.  My orthopedic doctor told me not to ever let anyone touch my neck."  She did have limited range of motion when moving neck.  Thanks, [employer]

## 2014-12-03 NOTE — Telephone Encounter (Signed)
This should not be an issue for colonoscopy.

## 2014-12-03 NOTE — Progress Notes (Signed)
No egg or soy allergy  No anesthesia or intubation problems per pt  No diet medications taken  Registered in [place]  Pt states that "my neck is a mess.  The nerves is my neck are so messed up.  It's really hard to move my neck."  I am sending a note to [employer] CRNA and Dr. Fuller Plan to make them aware

## 2014-12-03 NOTE — Telephone Encounter (Signed)
Cyril Mourning,  This issue does not disqualify a pt for anesthetic care at [facility].  The exclusions are patients who fall under the ASA IV guidelines which are predominately related to respiratory and cardiac illnesses.    Thanks,  Jenny Reichmann

## 2014-12-06 ENCOUNTER — Encounter: Payer: Self-pay | Admitting: Internal Medicine

## 2014-12-09 DIAGNOSIS — N3941 Urge incontinence: Secondary | ICD-10-CM | POA: Diagnosis not present

## 2014-12-09 DIAGNOSIS — N3020 Other chronic cystitis without hematuria: Secondary | ICD-10-CM | POA: Diagnosis not present

## 2014-12-10 DIAGNOSIS — S60212A Contusion of left wrist, initial encounter: Secondary | ICD-10-CM | POA: Diagnosis not present

## 2014-12-10 DIAGNOSIS — M1811 Unilateral primary osteoarthritis of first carpometacarpal joint, right hand: Secondary | ICD-10-CM | POA: Diagnosis not present

## 2014-12-10 DIAGNOSIS — G5603 Carpal tunnel syndrome, bilateral upper limbs: Secondary | ICD-10-CM | POA: Diagnosis not present

## 2014-12-10 DIAGNOSIS — M542 Cervicalgia: Secondary | ICD-10-CM | POA: Diagnosis not present

## 2014-12-10 DIAGNOSIS — M47812 Spondylosis without myelopathy or radiculopathy, cervical region: Secondary | ICD-10-CM | POA: Diagnosis not present

## 2014-12-10 DIAGNOSIS — S300XXA Contusion of lower back and pelvis, initial encounter: Secondary | ICD-10-CM | POA: Diagnosis not present

## 2014-12-13 ENCOUNTER — Ambulatory Visit (INDEPENDENT_AMBULATORY_CARE_PROVIDER_SITE_OTHER): Payer: Medicare Other | Admitting: Psychology

## 2014-12-13 DIAGNOSIS — F332 Major depressive disorder, recurrent severe without psychotic features: Secondary | ICD-10-CM | POA: Diagnosis not present

## 2014-12-18 ENCOUNTER — Ambulatory Visit (AMBULATORY_SURGERY_CENTER): Payer: Medicare Other | Admitting: Gastroenterology

## 2014-12-18 ENCOUNTER — Encounter: Payer: Self-pay | Admitting: Gastroenterology

## 2014-12-18 ENCOUNTER — Telehealth: Payer: Self-pay | Admitting: *Deleted

## 2014-12-18 VITALS — BP 110/74 | HR 56 | Temp 98.3°F | Resp 12 | Ht 66.0 in | Wt 238.0 lb

## 2014-12-18 DIAGNOSIS — D125 Benign neoplasm of sigmoid colon: Secondary | ICD-10-CM

## 2014-12-18 DIAGNOSIS — Z800 Family history of malignant neoplasm of digestive organs: Secondary | ICD-10-CM

## 2014-12-18 DIAGNOSIS — F329 Major depressive disorder, single episode, unspecified: Secondary | ICD-10-CM | POA: Diagnosis not present

## 2014-12-18 DIAGNOSIS — Z1211 Encounter for screening for malignant neoplasm of colon: Secondary | ICD-10-CM | POA: Diagnosis not present

## 2014-12-18 DIAGNOSIS — K219 Gastro-esophageal reflux disease without esophagitis: Secondary | ICD-10-CM | POA: Diagnosis not present

## 2014-12-18 DIAGNOSIS — K635 Polyp of colon: Secondary | ICD-10-CM | POA: Diagnosis not present

## 2014-12-18 DIAGNOSIS — I10 Essential (primary) hypertension: Secondary | ICD-10-CM | POA: Diagnosis not present

## 2014-12-18 MED ORDER — SODIUM CHLORIDE 0.9 % IV SOLN: 500.0000 mL | INTRAVENOUS | Status: DC

## 2014-12-18 NOTE — Telephone Encounter (Signed)
[facility] for pt to call to schedule 30 min ov appt with either Halford Chessman or [place] in March 2017. Will need to refill Re[facility] to [facility] in [facility] once this appt is made.

## 2014-12-18 NOTE — Patient Instructions (Signed)
Colon polyps removed today, handout given on polyps. Resume current medications. Call us with any questions or concerns. Thank you!   YOU HAD AN ENDOSCOPIC PROCEDURE TODAY AT [facility]:   Refer to the procedure report that was given to you for any specific questions about what was found during the examination.  If the procedure report does not answer your questions, please call your gastroenterologist to clarify.  If you requested that your care partner not be given the details of your procedure findings, then the procedure report has been included in a sealed envelope for you to review at your convenience later.  YOU SHOULD EXPECT: Some feelings of bloating in the abdomen. Passage of more gas than usual.  Walking can help get rid of the air that was put into your GI tract during the procedure and reduce the bloating. If you had a lower endoscopy (such as a colonoscopy or flexible sigmoidoscopy) you may notice spotting of blood in your stool or on the toilet paper. If you underwent a bowel prep for your procedure, you may not have a normal bowel movement for a few days.  Please Note:  You might notice some irritation and congestion in your nose or some drainage.  This is from the oxygen used during your procedure.  There is no need for concern and it should clear up in a day or so.  SYMPTOMS TO REPORT IMMEDIATELY:   Following lower endoscopy (colonoscopy or flexible sigmoidoscopy):  Excessive amounts of blood in the stool  Significant tenderness or worsening of abdominal pains  Swelling of the abdomen that is new, acute  Fever of 100F or higher  For urgent or emergent issues, a gastroenterologist can be reached at any hour by calling [phone].   DIET: Your first meal following the procedure should be a small meal and then it is ok to progress to your normal diet. Heavy or fried foods are harder to digest and may make you feel nauseous or bloated.  Likewise, meals heavy  in dairy and vegetables can increase bloating.  Drink plenty of fluids but you should avoid alcoholic beverages for 24 hours.  ACTIVITY:  You should plan to take it easy for the rest of today and you should NOT DRIVE or use heavy machinery until tomorrow (because of the sedation medicines used during the test).    FOLLOW UP: Our staff will call the number listed on your records the next business day following your procedure to check on you and address any questions or concerns that you may have regarding the information given to you following your procedure. If we do not reach you, we will leave a message.  However, if you are feeling well and you are not experiencing any problems, there is no need to return our call.  We will assume that you have returned to your regular daily activities without incident.  If any biopsies were taken you will be contacted by phone or by letter within the next 1-3 weeks.  Please call us at [phone] if you have not heard about the biopsies in 3 weeks.    SIGNATURES/CONFIDENTIALITY: You and/or your care partner have signed paperwork which will be entered into your electronic medical record.  These signatures attest to the fact that that the information above on your After Visit Summary has been reviewed and is understood.  Full responsibility of the confidentiality of this discharge information lies with you and/or your care-partner.

## 2014-12-18 NOTE — Progress Notes (Signed)
Patient fell at home two weeks ago. Patient's puppy tripped her. Patient went to MD following fall was given Prednisone dose pack. Patient has not started this medication, related to [facility] knew I was going to have this procedure."Bruising noted right outer thigh. Patient's stretcher flagged, Fall Risk.

## 2014-12-18 NOTE — Progress Notes (Signed)
Report to PACU, RN, vss, BBS= Clear.  

## 2014-12-18 NOTE — Progress Notes (Signed)
Called to room to assist during endoscopic procedure.  Patient ID and intended procedure confirmed with present staff. Received instructions for my participation in the procedure from the performing physician.  

## 2014-12-18 NOTE — Op Note (Signed)
[facility] [place]  [address] [place], [id number]   COLONOSCOPY PROCEDURE REPORT  PATIENT: [name], [name]  MR#: [id number] BIRTHDATE: 04/03/1945 , 73  yrs. old GENDER: female ENDOSCOPIST: Ladene Artist, MD, [facility] PROCEDURE DATE:  12/18/2014 PROCEDURE:   Colonoscopy, screening and Colonoscopy with snare polypectomy First Screening Colonoscopy - Avg.  risk and is 50 yrs.  old or older - No.  Prior Negative Screening - Now for repeat screening. Less than 10 yrs Prior Negative Screening - Now for repeat screening.  Above average risk  History of Adenoma - Now for follow-up colonoscopy & has been > or = to 3 yrs.  N/A  Polyps removed today? Yes ASA CLASS:   Class II INDICATIONS:Screening for colonic neoplasia and FH Colon or Rectal Adenocarcinoma. MEDICATIONS: Monitored anesthesia care and Propofol 200 mg IV DESCRIPTION OF PROCEDURE:   After the risks benefits and alternatives of the procedure were thoroughly explained, informed consent was obtained.  The digital rectal exam revealed no abnormalities of the rectum.   The [id number]  endoscope was introduced through the anus and advanced to the cecum, which was identified by both the appendix and ileocecal valve. No adverse events experienced.   The quality of the prep was excellent. (Suprep was used)  The instrument was then slowly withdrawn as the colon was fully examined. Estimated blood loss is zero unless otherwise noted in this procedure report.    COLON FINDINGS: Two sessile polyps measuring 4 mm in size were found in the sigmoid colon.  Polypectomies were performed with a cold snare.  The resection was complete, the polyp tissue was completely retrieved and sent to histology.   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   The examination was otherwise normal. Retroflexed views revealed no abnormalities. The time to cecum = 2.0 Withdrawal time = 9.4   The scope was withdrawn and  the procedure completed. COMPLICATIONS: There were no immediate complications.  ENDOSCOPIC IMPRESSION: 1.   Two sessile polyps in the sigmoid colon; polypectomies performed with a cold snare 2.   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   The examination was otherwise normal  RECOMMENDATIONS: 1.  Await pathology results 2.  Repeat colonoscopy in 5 years if polyp(s) adenomatous; otherwise, given your age, you will not need another colonoscopy for colon cancer screening or polyp surveillance.  These types of tests usually stop around the age 42.  eSigned:  Ladene Artist, MD, [facility] 12/18/2014 11:23 AM

## 2014-12-19 ENCOUNTER — Telehealth: Payer: Self-pay | Admitting: Emergency Medicine

## 2014-12-19 ENCOUNTER — Other Ambulatory Visit: Payer: Self-pay | Admitting: Internal Medicine

## 2014-12-19 NOTE — Telephone Encounter (Signed)
Spoke with pt. She scheduled appt to see Dr. Halford Chessman 04/09/15. Dr. Halford Chessman are you okay if we refill the requip? thanks

## 2014-12-19 NOTE — Telephone Encounter (Signed)
[facility] called requesting refill on Requip.  I advised of the notes from [place] that the patient needs to call and schedule an appointment before the medication can be refilled.

## 2014-12-19 NOTE — Telephone Encounter (Signed)
Left message, no identifier present, f/u 

## 2014-12-20 MED ORDER — ROPINIROLE HCL 1 MG PO TABS: 1.0000 mg | ORAL_TABLET | Freq: Two times a day (BID) | ORAL | Status: DC

## 2014-12-20 NOTE — Telephone Encounter (Signed)
Okay to refill requip.

## 2014-12-20 NOTE — Telephone Encounter (Signed)
Spoke with pt. She is aware that we are going to send in her refill. This has been done. Nothing further was needed.

## 2014-12-26 ENCOUNTER — Encounter: Payer: Self-pay | Admitting: Gastroenterology

## 2014-12-27 ENCOUNTER — Ambulatory Visit (INDEPENDENT_AMBULATORY_CARE_PROVIDER_SITE_OTHER): Payer: Medicare Other | Admitting: Psychology

## 2014-12-27 DIAGNOSIS — F332 Major depressive disorder, recurrent severe without psychotic features: Secondary | ICD-10-CM | POA: Diagnosis not present

## 2015-01-09 DIAGNOSIS — S7001XA Contusion of right hip, initial encounter: Secondary | ICD-10-CM | POA: Diagnosis not present

## 2015-01-09 DIAGNOSIS — S60212A Contusion of left wrist, initial encounter: Secondary | ICD-10-CM | POA: Diagnosis not present

## 2015-01-09 DIAGNOSIS — M47812 Spondylosis without myelopathy or radiculopathy, cervical region: Secondary | ICD-10-CM | POA: Diagnosis not present

## 2015-01-09 DIAGNOSIS — T8484XA Pain due to internal orthopedic prosthetic devices, implants and grafts, initial encounter: Secondary | ICD-10-CM | POA: Diagnosis not present

## 2015-01-09 DIAGNOSIS — M6751 Plica syndrome, right knee: Secondary | ICD-10-CM | POA: Diagnosis not present

## 2015-01-10 ENCOUNTER — Ambulatory Visit (INDEPENDENT_AMBULATORY_CARE_PROVIDER_SITE_OTHER): Payer: Medicare Other | Admitting: Psychology

## 2015-01-10 DIAGNOSIS — F332 Major depressive disorder, recurrent severe without psychotic features: Secondary | ICD-10-CM | POA: Diagnosis not present

## 2015-01-13 DIAGNOSIS — M5030 Other cervical disc degeneration, unspecified cervical region: Secondary | ICD-10-CM | POA: Diagnosis not present

## 2015-01-13 DIAGNOSIS — M47892 Other spondylosis, cervical region: Secondary | ICD-10-CM | POA: Diagnosis not present

## 2015-01-15 DIAGNOSIS — M5030 Other cervical disc degeneration, unspecified cervical region: Secondary | ICD-10-CM | POA: Diagnosis not present

## 2015-01-15 DIAGNOSIS — M47892 Other spondylosis, cervical region: Secondary | ICD-10-CM | POA: Diagnosis not present

## 2015-01-22 DIAGNOSIS — M5030 Other cervical disc degeneration, unspecified cervical region: Secondary | ICD-10-CM | POA: Diagnosis not present

## 2015-01-22 DIAGNOSIS — M47892 Other spondylosis, cervical region: Secondary | ICD-10-CM | POA: Diagnosis not present

## 2015-01-23 DIAGNOSIS — M47812 Spondylosis without myelopathy or radiculopathy, cervical region: Secondary | ICD-10-CM | POA: Diagnosis not present

## 2015-01-23 DIAGNOSIS — M6751 Plica syndrome, right knee: Secondary | ICD-10-CM | POA: Diagnosis not present

## 2015-01-24 ENCOUNTER — Ambulatory Visit (INDEPENDENT_AMBULATORY_CARE_PROVIDER_SITE_OTHER): Payer: Medicare Other | Admitting: Psychology

## 2015-01-24 ENCOUNTER — Ambulatory Visit (INDEPENDENT_AMBULATORY_CARE_PROVIDER_SITE_OTHER): Payer: Medicare Other

## 2015-01-24 DIAGNOSIS — Z23 Encounter for immunization: Secondary | ICD-10-CM

## 2015-01-24 DIAGNOSIS — F332 Major depressive disorder, recurrent severe without psychotic features: Secondary | ICD-10-CM

## 2015-01-27 DIAGNOSIS — M47892 Other spondylosis, cervical region: Secondary | ICD-10-CM | POA: Diagnosis not present

## 2015-01-27 DIAGNOSIS — M5030 Other cervical disc degeneration, unspecified cervical region: Secondary | ICD-10-CM | POA: Diagnosis not present

## 2015-01-30 ENCOUNTER — Ambulatory Visit (INDEPENDENT_AMBULATORY_CARE_PROVIDER_SITE_OTHER): Payer: Medicare Other | Admitting: Medical

## 2015-01-30 ENCOUNTER — Encounter: Payer: Self-pay | Admitting: Medical

## 2015-01-30 VITALS — BP 120/80 | HR 81 | Temp 98.3°F | Ht 66.0 in | Wt 241.0 lb

## 2015-01-30 DIAGNOSIS — J0100 Acute maxillary sinusitis, unspecified: Secondary | ICD-10-CM

## 2015-01-30 MED ORDER — BENZONATATE 200 MG PO CAPS: 200.0000 mg | ORAL_CAPSULE | Freq: Three times a day (TID) | ORAL | Status: DC | PRN

## 2015-01-30 MED ORDER — AZITHROMYCIN 250 MG PO TABS: ORAL_TABLET | ORAL | Status: DC

## 2015-01-30 NOTE — Progress Notes (Signed)
Pre visit review using our clinic review tool, if applicable. No additional management support is needed unless otherwise documented below in the visit note. 

## 2015-01-30 NOTE — Progress Notes (Signed)
Subjective:    Patient ID: [name], female    DOB: [birth year], 69 y.o.   MRN: [id number]  HPI  Pt in with sinus pain. She states pain for 2 wks. Hx of sinus infections. Started out with runny nose, nasal congestion and sneezing. Pt has been using both astelin and flonase just recently.   Pt has teeth pain upper and lower with sinus pressure.  Review of Systems  Constitutional: Negative for fever, chills, diaphoresis and fatigue.  HENT: Positive for congestion and sinus pressure. Negative for ear pain, rhinorrhea, sneezing and sore throat.   Respiratory: Positive for cough. Negative for chest tightness and wheezing.        Some mild cough in the morning.  Cardiovascular: Negative for chest pain and palpitations.  Gastrointestinal: Negative for abdominal pain.  Endocrine: Negative for polydipsia, polyphagia and polyuria.  Musculoskeletal: Negative for back pain.  Neurological: Negative for dizziness, numbness and headaches.  Hematological: Negative for adenopathy. Does not bruise/bleed easily.  Psychiatric/Behavioral: Negative for behavioral problems.    Past Medical History  Diagnosis Date  . Hyperlipidemia   . HTN (hypertension)   . GERD     GERD and HH  . Depression   . Insomnia   . Barrett's esophagus   . RLS (restless legs syndrome)   . Family history of anesthesia complication     Mother had severe N/V  . Migraine   . DJD (degenerative joint disease)     bilateral hands; knees  . Chronic lower back pain   . H/O cardiac catheterization     (-) cath 12-2002  , cath again 2011 (-)  . Vitamin B 12 deficiency 04/09/2013  . C. difficile diarrhea 08/01/2012    severe 2014  . Bilateral carpal tunnel syndrome     Dr. Eddie Dibbles, having injection therapy  . Anxiety   . GERD (gastroesophageal reflux disease)   . Osteoporosis     pt unsure of this  . Chronic cystitis     Dr. Matilde Sprang    Social History   Social History  . Marital Status: Divorced    Spouse Name: N/A   . Number of Children: 1  . Years of Education: N/A   Occupational History  . Retired, post office     Social History Main Topics  . Smoking status: Former Smoker -- 0.50 packs/day for 10 years    Quit date: 02/09/1975  . Smokeless tobacco: Never Used     Comment: started at age 16.   quit in the 1970s.  . Alcohol Use: Yes     Comment: 10/05/2011 "socially;  wine or mixed drink at the most once/month"  . Drug Use: No  . Sexual Activity: Yes   Other Topics Concern  . Not on file   Social History Narrative   Son lives w/ her     Past Surgical History  Procedure Laterality Date  . Vaginal hysterectomy      for endometriosis  . Oophorectomy    . Bilateral knee arthroscopy    . Foot surgery      Bilateral; "toenails removed; callus removed on right; hammertoes right"  . Arm surgery      Left; "don't remember what they did; arm wasn't broken"  . Colonoscopy w/ polypectomy    . Cardiac catheterization  01/22/10    clean cath  . Shoulder arthroscopy      right  . Lumbar disc surgery      L5 S1 anterior fusion  .  Cataract extraction w/ intraocular lens  implant, bilateral    . Knot      "removed from right neck; not a goiter"  . Total knee arthroplasty  10/05/2011    Procedure: TOTAL KNEE ARTHROPLASTY;  Surgeon: Hessie Dibble, MD;  Location: [place];  Service: Orthopedics;  Laterality: Right;  . Colonoscopy      Family History  Problem Relation Age of Onset  . Colon cancer Maternal Grandfather   . Lung cancer Mother   . Esophageal cancer Brother   . Breast cancer Sister   . Diabetes Other     Aunt  . Dementia Father   . CAD Father     dx in his 31s  . Stroke Father   . Colon cancer Maternal Uncle     2  . Rectal cancer Neg Hx   . Stomach cancer Neg Hx     Allergies  Allergen Reactions  . Dextromethorphan-Guaifenesin Nausea And Vomiting  . Morphine Nausea And Vomiting  . Penicillins Rash    "> 30 years ago; best I can remember it was just a light rash on  my arm"    Current Outpatient Prescriptions on File Prior to Visit  Medication Sig Dispense Refill  . aspirin EC 81 MG tablet Take 81 mg by mouth daily.    [name] atenolol (TENORMIN) 25 MG tablet Take 0.5 tablets (12.5 mg total) by mouth daily. 45 tablet 2  . azelastine (ASTELIN) 0.1 % nasal spray Place 2 sprays into both nostrils at bedtime as needed for rhinitis. Use in each nostril as directed 30 mL 3  . benazepril (LOTENSIN) 20 MG tablet Take 1 tablet (20 mg total) by mouth daily. 90 tablet 2  . Calcium Carbonate-Vitamin D (CALCIUM PLUS VITAMIN D PO) Take by mouth daily.    . clonazePAM (KLONOPIN) 0.5 MG tablet Take 1 tablet (0.5 mg total) by mouth 3 (three) times daily as needed for anxiety. 60 tablet 4  . HYDROcodone-acetaminophen (NORCO) 10-325 MG per tablet Take 1 tablet by mouth every 4 (four) hours as needed for pain. For back pain 60 tablet 0  . mirabegron ER (MYRBETRIQ) 25 MG TB24 tablet Take 25 mg by mouth daily.    [name] omeprazole (PRILOSEC) 40 MG capsule Take 1 capsule (40 mg total) by mouth 2 (two) times daily. 60 capsule 11  . pravastatin (PRAVACHOL) 40 MG tablet Take 1 tablet (40 mg total) by mouth daily. 90 tablet 2  . predniSONE (STERAPRED UNI-PAK 21 TAB) 10 MG (21) TBPK tablet     . ranitidine (ZANTAC) 300 MG tablet Take 1 tablet (300 mg total) by mouth at bedtime. 90 tablet 1  . rOPINIRole (REQUIP) 1 MG tablet Take 1 tablet (1 mg total) by mouth 2 (two) times daily. 60 tablet 3  . topiramate (TOPAMAX) 200 MG tablet Take 400 mg by mouth 2 (two) times daily. Take 1.5 by mouth at bedtime    . traZODone (DESYREL) 50 MG tablet Take 1-2 tablets (50-100 mg total) by mouth at bedtime. 60 tablet 5  . venlafaxine XR (EFFEXOR XR) 75 MG 24 hr capsule Take 3 capsules (225 mg total) by mouth daily with breakfast. 270 capsule 1   No current facility-administered medications on file prior to visit.    BP 120/80 mmHg  Pulse 81  Temp(Src) 98.3 F (36.8 C) (Oral)  Ht 5\' 6"  (1.676 m)  Wt 241  lb (109.317 kg)  BMI 38.92 kg/m2  SpO2 100%  Objective:   Physical Exam  General  Mental Status - Alert. General Appearance - Well groomed. Not in acute distress.  Skin Rashes- No Rashes.  HEENT Head- Normal. Ear Auditory Canal - Left- Normal. Right - Normal.Tympanic Membrane- Left- Normal. Right- Normal. Eye Sclera/Conjunctiva- Left- Normal. Right- Normal. Nose & Sinuses Nasal Mucosa- Left-  Boggy and Congested. Right-  Boggy and  Congested.Bilateral maxillary and frontal sinus pressure. (hx of hole in nasal septum years ago after severe sinus infection.  Mouth & Throat Lips: Upper Lip- Normal: no dryness, cracking, pallor, cyanosis, or vesicular eruption. Lower Lip-Normal: no dryness, cracking, pallor, cyanosis or vesicular eruption. Buccal Mucosa- Bilateral- No Aphthous ulcers. Oropharynx- No Discharge or Erythema. Tonsils: Characteristics- Bilateral- No Erythema or Congestion. Size/Enlargement- Bilateral- No enlargement. Discharge- bilateral-None.  Neck Neck- Supple. No Masses.   Chest and Lung Exam Auscultation: Breath Sounds:-Clear even and unlabored.  Cardiovascular Auscultation:Rythm- Regular, rate and rhythm. Murmurs & Other Heart Sounds:Ausculatation of the heart reveal- No Murmurs.  Lymphatic Head & Neck General Head & Neck Lymphatics: Bilateral: Description- No Localized lymphadenopathy.       Assessment & Plan:  You appear to have a sinus infection. I am prescribing azithromycin  antibiotic for the infection. To help with the nasal congestion contine both astelin + nasal steroid. For your associated cough, I prescribed cough medicine benzonatate  Rest, hydrate, tylenol for fever.  Follow up in 7 days or as needed.

## 2015-01-30 NOTE — Patient Instructions (Addendum)
You appear to have a sinus infection. I am prescribing azithromycin  antibiotic for the infection. To help with the nasal congestion contine both astelin + nasal steroid. For your associated cough, I prescribed cough medicine benzonatate  Rest, hydrate, tylenol for fever.  Follow up in 7 days or as needed.

## 2015-02-14 ENCOUNTER — Ambulatory Visit (INDEPENDENT_AMBULATORY_CARE_PROVIDER_SITE_OTHER): Payer: Medicare Other

## 2015-02-14 ENCOUNTER — Ambulatory Visit (INDEPENDENT_AMBULATORY_CARE_PROVIDER_SITE_OTHER): Payer: Medicare Other | Admitting: Psychology

## 2015-02-14 DIAGNOSIS — F332 Major depressive disorder, recurrent severe without psychotic features: Secondary | ICD-10-CM | POA: Diagnosis not present

## 2015-02-14 DIAGNOSIS — E559 Vitamin D deficiency, unspecified: Secondary | ICD-10-CM | POA: Diagnosis not present

## 2015-02-14 MED ORDER — CYANOCOBALAMIN 1000 MCG/ML IJ SOLN
1000.0000 ug | Freq: Once | INTRAMUSCULAR | Status: AC
  Administered 2015-02-14: 1000 ug via INTRAMUSCULAR

## 2015-02-14 NOTE — Progress Notes (Signed)
Pre visit review using our clinic review tool, if applicable. No additional management support is needed unless otherwise documented below in the visit note.  Pt tolerated injection well.  No signs of a reaction upon leaving the clinic.  Next B12 injection scheduled for 03/18/15 during OV with Dr. Larose Kells.

## 2015-02-15 ENCOUNTER — Other Ambulatory Visit: Payer: Self-pay | Admitting: Gastroenterology

## 2015-02-21 ENCOUNTER — Telehealth: Payer: Self-pay | Admitting: Gastroenterology

## 2015-02-21 MED ORDER — OMEPRAZOLE 40 MG PO CPDR
40.0000 mg | DELAYED_RELEASE_CAPSULE | Freq: Two times a day (BID) | ORAL | Status: DC
Start: 2015-02-21 — End: 2015-04-01

## 2015-02-21 NOTE — Telephone Encounter (Signed)
Informed patient she is due for an office visit with Dr. Fuller Plan and that's why we have not refilled omeprazole. Patient scheduled follow up appt  on 03/31/15 at 2:45pm. Informed patient that I will refill her medication for one month until her appt. Pt verbalized understanding.

## 2015-03-07 ENCOUNTER — Ambulatory Visit (INDEPENDENT_AMBULATORY_CARE_PROVIDER_SITE_OTHER): Payer: Medicare Other | Admitting: Psychology

## 2015-03-07 DIAGNOSIS — F332 Major depressive disorder, recurrent severe without psychotic features: Secondary | ICD-10-CM

## 2015-03-17 ENCOUNTER — Ambulatory Visit (INDEPENDENT_AMBULATORY_CARE_PROVIDER_SITE_OTHER): Payer: Medicare Other | Admitting: Internal Medicine

## 2015-03-17 ENCOUNTER — Encounter: Payer: Self-pay | Admitting: Internal Medicine

## 2015-03-17 VITALS — BP 124/72 | HR 78 | Temp 98.1°F | Ht 66.0 in | Wt 244.2 lb

## 2015-03-17 DIAGNOSIS — E538 Deficiency of other specified B group vitamins: Secondary | ICD-10-CM | POA: Diagnosis not present

## 2015-03-17 DIAGNOSIS — I10 Essential (primary) hypertension: Secondary | ICD-10-CM

## 2015-03-17 DIAGNOSIS — F32A Depression, unspecified: Secondary | ICD-10-CM

## 2015-03-17 DIAGNOSIS — F329 Major depressive disorder, single episode, unspecified: Secondary | ICD-10-CM

## 2015-03-17 DIAGNOSIS — G4700 Insomnia, unspecified: Secondary | ICD-10-CM | POA: Diagnosis not present

## 2015-03-17 DIAGNOSIS — M159 Polyosteoarthritis, unspecified: Secondary | ICD-10-CM

## 2015-03-17 DIAGNOSIS — M150 Primary generalized (osteo)arthritis: Secondary | ICD-10-CM | POA: Diagnosis not present

## 2015-03-17 DIAGNOSIS — M8949 Other hypertrophic osteoarthropathy, multiple sites: Secondary | ICD-10-CM

## 2015-03-17 MED ORDER — CYANOCOBALAMIN 1000 MCG/ML IJ SOLN
1000.0000 ug | Freq: Once | INTRAMUSCULAR | Status: AC
  Administered 2015-03-17: 1000 ug via INTRAMUSCULAR

## 2015-03-17 NOTE — Patient Instructions (Signed)
GO TO THE LAB : Get the blood work    GO TO THE FRONT DESK Schedule a complete physical exam to be done by 09-2015  Please be fasting

## 2015-03-17 NOTE — Progress Notes (Signed)
Pre visit review using our clinic review tool, if applicable. No additional management support is needed unless otherwise documented below in the visit note. 

## 2015-03-17 NOTE — Progress Notes (Signed)
Subjective:    Patient ID: [name], female    DOB: 1945-07-20, 70 y.o.   MRN: [id number]  DOS:  03/17/2015 Type of visit - description : Routine office visit Interval history: In general doing well, good compliance w/ medications without apparent side effects. Anxiety: On clonazepam, needs a contract Pain management: Dr. Eddie Dibbles retiring, he used to prescribed her pain medication. Due for a B12 shot.   Review of Systems Denies chest pain or difficulty breathing No nausea, vomiting, diarrhea  Past Medical History  Diagnosis Date  . Hyperlipidemia   . HTN (hypertension)   . GERD     GERD and HH  . Depression   . Insomnia   . Barrett's esophagus   . RLS (restless legs syndrome)   . Family history of anesthesia complication     Mother had severe N/V  . Migraine   . DJD (degenerative joint disease)     bilateral hands; knees  . Chronic lower back pain   . H/O cardiac catheterization     (-) cath 12-2002  , cath again 2011 (-)  . Vitamin B 12 deficiency 04/09/2013  . C. difficile diarrhea 08/01/2012    severe 2014  . Bilateral carpal tunnel syndrome     Dr. Eddie Dibbles, having injection therapy  . Anxiety   . GERD (gastroesophageal reflux disease)   . Osteoporosis     pt unsure of this  . Chronic cystitis     Dr. Matilde Sprang    Past Surgical History  Procedure Laterality Date  . Vaginal hysterectomy      for endometriosis  . Oophorectomy    . Bilateral knee arthroscopy    . Foot surgery      Bilateral; "toenails removed; callus removed on right; hammertoes right"  . Arm surgery      Left; "don't remember what they did; arm wasn't broken"  . Colonoscopy w/ polypectomy    . Cardiac catheterization  01/22/10    clean cath  . Shoulder arthroscopy      right  . Lumbar disc surgery      L5 S1 anterior fusion  . Cataract extraction w/ intraocular lens  implant, bilateral    . Knot      "removed from right neck; not a goiter"  . Total knee arthroplasty  10/05/2011   Procedure: TOTAL KNEE ARTHROPLASTY;  Surgeon: Hessie Dibble, MD;  Location: [facility];  Service: Orthopedics;  Laterality: Right;  . Colonoscopy      Social History   Social History  . Marital Status: Divorced    Spouse Name: N/A  . Number of Children: 1  . Years of Education: N/A   Occupational History  . Retired, post office     Social History Main Topics  . Smoking status: Former Smoker -- 0.50 packs/day for 10 years    Quit date: 02/09/1975  . Smokeless tobacco: Never Used     Comment: started at age 62.   quit in the 1970s.  . Alcohol Use: Yes     Comment: 10/05/2011 "socially;  wine or mixed drink at the most once/month"  . Drug Use: No  . Sexual Activity: Yes   Other Topics Concern  . Not on file   Social History Narrative   Son lives w/ her         Medication List       This list is accurate as of: 03/17/15 11:59 PM.  Always use your most recent med list.  aspirin EC 81 MG tablet  Take 81 mg by mouth daily.     atenolol 25 MG tablet  Commonly known as:  TENORMIN  Take 0.5 tablets (12.5 mg total) by mouth daily.     benazepril 20 MG tablet  Commonly known as:  LOTENSIN  Take 1 tablet (20 mg total) by mouth daily.     CALCIUM PLUS VITAMIN D PO  Take by mouth daily.     clonazePAM 0.5 MG tablet  Commonly known as:  KLONOPIN  Take 1 tablet (0.5 mg total) by mouth 3 (three) times daily as needed for anxiety.     HYDROcodone-acetaminophen 10-325 MG tablet  Commonly known as:  NORCO  Take 1 tablet by mouth every 4 (four) hours as needed for pain. For back pain     MYRBETRIQ 25 MG Tb24 tablet  Generic drug:  mirabegron ER  Take 25 mg by mouth daily.     omeprazole 40 MG capsule  Commonly known as:  PRILOSEC  Take 1 capsule (40 mg total) by mouth 2 (two) times daily.     pravastatin 40 MG tablet  Commonly known as:  PRAVACHOL  Take 1 tablet (40 mg total) by mouth daily.     ranitidine 300 MG tablet  Commonly known as:  ZANTAC    Take 1 tablet (300 mg total) by mouth at bedtime.     rOPINIRole 1 MG tablet  Commonly known as:  REQUIP  Take 1 tablet (1 mg total) by mouth 2 (two) times daily.     topiramate 200 MG tablet  Commonly known as:  TOPAMAX  Take 400 mg by mouth 2 (two) times daily. Take 1.5 by mouth at bedtime     traZODone 50 MG tablet  Commonly known as:  DESYREL  Take 1-2 tablets (50-100 mg total) by mouth at bedtime.     venlafaxine XR 75 MG 24 hr capsule  Commonly known as:  EFFEXOR XR  Take 3 capsules (225 mg total) by mouth daily with breakfast.           Objective:   Physical Exam BP 124/72 mmHg  Pulse 78  Temp(Src) 98.1 F (36.7 C) (Oral)  Ht 5\' 6"  (1.676 m)  Wt 244 lb 4 oz (110.791 kg)  BMI 39.44 kg/m2  SpO2 97% General:   Well developed, well nourished . NAD.  HEENT:  Normocephalic . Face symmetric, atraumatic Lungs:  CTA B Normal respiratory effort, no intercostal retractions, no accessory muscle use. Heart: RRR,  no murmur.  No pretibial edema bilaterally  Skin: Not pale. Not jaundice Neurologic:  alert & oriented X3.  Speech normal, gait appropriate for age and unassisted Psych--  Cognition and judgment appear intact.  Cooperative with normal attention span and concentration.  Behavior appropriate. No anxious or depressed appearing.      Assessment & Plan:   Assessment> HTN Hyperlipidemia Depression (effexor) , anxiety (clonazepam),  Insomnia (trazodone) GI:  --GERD, Barrett's esophagus --Persistent C. difficile diarrhea 2014 B12 deficiency Osteopenia - Bone density test 09-2013 showed osteopenia, on calcium, vitamin D RLS - Requip Migraines, f/u [facility] -- on topamax MSK: ---- pain meds rx by ortho, Dr Eddie Dibbles retired thus pcp will rx if needed as off 03-1015, takes < than 2-3 qd  ----DJD ----Carpal tunnel syndrome bilaterally ----Chronic low back pain OAB, LUTS-- on myrbetriq Dr [place]oth  H/o Cardiac catheterization 2011 and 2014  negative  PLAN HTN: Continue Lotensin, check a BMP anxiety : On clonazepam, contract signed  today B12 Deficiency: Shot today Pain management: will start rxing  hydrocodone if needed, on average takes less than 2 or 3 tablets daily. RTC [id number] CPX

## 2015-03-18 ENCOUNTER — Ambulatory Visit: Payer: Self-pay | Admitting: Internal Medicine

## 2015-03-18 LAB — BASIC METABOLIC PANEL
BUN: 23 mg/dL (ref 6–23)
CO2: 22 mEq/L (ref 19–32)
Calcium: 8.8 mg/dL (ref 8.4–10.5)
Chloride: 110 mEq/L (ref 96–112)
Creatinine, Ser: 0.93 mg/dL (ref 0.40–1.20)
GFR: 63.26 mL/min (ref >60.00)
Glucose, Bld: 108 mg/dL — ABNORMAL HIGH (ref 70–99)
Potassium: 4 mEq/L (ref 3.5–5.1)
Sodium: 139 mEq/L (ref 135–145)

## 2015-03-21 ENCOUNTER — Ambulatory Visit (INDEPENDENT_AMBULATORY_CARE_PROVIDER_SITE_OTHER): Payer: Medicare Other | Admitting: Psychology

## 2015-03-21 DIAGNOSIS — F332 Major depressive disorder, recurrent severe without psychotic features: Secondary | ICD-10-CM

## 2015-03-31 ENCOUNTER — Encounter: Payer: Self-pay | Admitting: Gastroenterology

## 2015-03-31 ENCOUNTER — Ambulatory Visit (INDEPENDENT_AMBULATORY_CARE_PROVIDER_SITE_OTHER): Payer: Medicare Other | Admitting: Gastroenterology

## 2015-03-31 VITALS — BP 100/66 | HR 80 | Ht 66.0 in | Wt 248.8 lb

## 2015-03-31 DIAGNOSIS — R1031 Right lower quadrant pain: Secondary | ICD-10-CM

## 2015-03-31 DIAGNOSIS — K5900 Constipation, unspecified: Secondary | ICD-10-CM

## 2015-03-31 DIAGNOSIS — K2270 Barrett's esophagus without dysplasia: Secondary | ICD-10-CM | POA: Diagnosis not present

## 2015-03-31 DIAGNOSIS — H26493 Other secondary cataract, bilateral: Secondary | ICD-10-CM | POA: Diagnosis not present

## 2015-03-31 DIAGNOSIS — H524 Presbyopia: Secondary | ICD-10-CM | POA: Diagnosis not present

## 2015-03-31 NOTE — Progress Notes (Signed)
    History of Present Illness: This is a 70 year old female with Barrett's esophagus complaining of frequent breakthrough reflux symptoms and morning nausea. She also notes problems with constipation and occasional right lower quadrant pain.  Current Medications, Allergies, Past Medical History, Past Surgical History, Family History and Social History were reviewed in [employer] record.  Physical Exam: General: Well developed, well nourished, no acute distress Head: Normocephalic and atraumatic Eyes:  sclerae anicteric, EOMI Ears: Normal auditory acuity Mouth: No deformity or lesions Lungs: Clear throughout to auscultation Heart: Regular rate and rhythm; no murmurs, rubs or bruits Abdomen: Soft, non tender and non distended. No masses, hepatosplenomegaly or hernias noted. Normal Bowel sounds Musculoskeletal: Symmetrical with no gross deformities  Pulses:  Normal pulses noted Extremities: No clubbing, cyanosis, edema or deformities noted Neurological: Alert oriented x 4, grossly nonfocal Psychological:  Alert and cooperative. Normal mood and affect  Assessment and Recommendations:  1. Barrett's esophagus. GERD. Change timing of omeprazole 40 mg to 30 minutes before breakfast and before evening meal. Continue ranitidine 300 mg at bedtime. Strictly follow all antireflux measures. Emphasized that it is very important that she begins the use of 4 inch bed blocks for long-term control of nocturnal reflux symptoms which are likely leading to the morning nausea. Use Tums as needed. Schedule surveillance EGD. The risks (including bleeding, perforation, infection, missed lesions, medication reactions and possible hospitalization or surgery if complications occur), benefits, and alternatives to endoscopy with possible biopsy and possible dilation were discussed with the patient and they consent to proceed.   2. Constipation and occasional right lower quadrant pain. Recent  colonoscopy showed only 2 small hyperplastic polyps and a lipoma. Begin MiraLAX once daily and increase dietary fiber and daily water intake.

## 2015-03-31 NOTE — Patient Instructions (Signed)
Start taking your omeprazole 30 minutes before breakfast and dinner.   Start over the counter Miralax mixing 17 grams in 8 oz of water daily.   Patient advised to avoid spicy, acidic, citrus, chocolate, mints, fruit and fruit juices.  Limit the intake of caffeine, alcohol and Soda.  Don't exercise too soon after eating.  Don't lie down within 3-4 hours of eating.  Elevate the head of your bed. Use 4 " bed blocks  You have been scheduled for an endoscopy. Please follow written instructions given to you at your visit today. If you use inhalers (even only as needed), please bring them with you on the day of your procedure. Your physician has requested that you go to www.startemmi.com and enter the access code given to you at your visit today. This web site gives a general overview about your procedure. However, you should still follow specific instructions given to you by our office regarding your preparation for the procedure.  Thank you for choosing me and [facility].  Pricilla Riffle. Dagoberto Ligas., MD., [facility]

## 2015-04-01 ENCOUNTER — Other Ambulatory Visit: Payer: Self-pay | Admitting: Gastroenterology

## 2015-04-02 DIAGNOSIS — G43019 Migraine without aura, intractable, without status migrainosus: Secondary | ICD-10-CM | POA: Diagnosis not present

## 2015-04-02 DIAGNOSIS — G43719 Chronic migraine without aura, intractable, without status migrainosus: Secondary | ICD-10-CM | POA: Diagnosis not present

## 2015-04-04 ENCOUNTER — Ambulatory Visit (INDEPENDENT_AMBULATORY_CARE_PROVIDER_SITE_OTHER): Payer: Medicare Other | Admitting: Psychology

## 2015-04-04 DIAGNOSIS — F332 Major depressive disorder, recurrent severe without psychotic features: Secondary | ICD-10-CM | POA: Diagnosis not present

## 2015-04-09 ENCOUNTER — Ambulatory Visit: Payer: Self-pay | Admitting: Pulmonary Disease

## 2015-04-09 ENCOUNTER — Telehealth: Payer: Self-pay

## 2015-04-09 MED ORDER — OSELTAMIVIR PHOSPHATE 75 MG PO CAPS: 75.0000 mg | ORAL_CAPSULE | Freq: Every day | ORAL | Status: DC

## 2015-04-09 NOTE — Telephone Encounter (Signed)
-----   Message from Colon Branch, MD sent at 04/09/2015 11:20 AM EST ----- Regarding: Send a prescription Tamiflu 75 mg one tablet daily #10 no refills

## 2015-04-09 NOTE — Telephone Encounter (Signed)
Rx sent 

## 2015-04-16 ENCOUNTER — Other Ambulatory Visit: Payer: Self-pay | Admitting: Pulmonary Disease

## 2015-04-18 ENCOUNTER — Other Ambulatory Visit: Payer: Self-pay | Admitting: Internal Medicine

## 2015-04-18 ENCOUNTER — Ambulatory Visit (INDEPENDENT_AMBULATORY_CARE_PROVIDER_SITE_OTHER): Payer: Medicare Other | Admitting: Psychology

## 2015-04-18 DIAGNOSIS — F332 Major depressive disorder, recurrent severe without psychotic features: Secondary | ICD-10-CM

## 2015-04-21 ENCOUNTER — Other Ambulatory Visit: Payer: Self-pay | Admitting: Gastroenterology

## 2015-04-28 ENCOUNTER — Ambulatory Visit (INDEPENDENT_AMBULATORY_CARE_PROVIDER_SITE_OTHER): Payer: Medicare Other | Admitting: Psychology

## 2015-04-28 DIAGNOSIS — F332 Major depressive disorder, recurrent severe without psychotic features: Secondary | ICD-10-CM

## 2015-04-29 ENCOUNTER — Ambulatory Visit: Payer: Self-pay | Admitting: Adult Health

## 2015-05-01 ENCOUNTER — Encounter: Payer: Self-pay | Admitting: Adult Health

## 2015-05-01 ENCOUNTER — Ambulatory Visit (INDEPENDENT_AMBULATORY_CARE_PROVIDER_SITE_OTHER): Payer: Medicare Other | Admitting: Adult Health

## 2015-05-01 VITALS — BP 114/76 | HR 68 | Temp 98.3°F | Ht 66.0 in | Wt 244.0 lb

## 2015-05-01 DIAGNOSIS — G2581 Restless legs syndrome: Secondary | ICD-10-CM | POA: Diagnosis not present

## 2015-05-01 MED ORDER — ROPINIROLE HCL 1 MG PO TABS: ORAL_TABLET | ORAL | Status: DC

## 2015-05-01 NOTE — Patient Instructions (Signed)
Continue on Requip .  Follow up Dr. Halford Chessman  In 1 year and As needed

## 2015-05-08 NOTE — Assessment & Plan Note (Signed)
RLS doing well on Requip   Plan  Continue on Requip .  Follow up Dr. Halford Chessman  In 1 year and As needed

## 2015-05-08 NOTE — Progress Notes (Signed)
   Subjective:    Patient ID: [name], female    DOB: [birth year], 70 y.o.   MRN: [id number]  HPI 70 yo female followed for restless leg syndrome on Requip.  Former pt of Dr. Gwenette [name]   05/01/15 Follow up : RLS  Pt returns for follow up for RLS  She is maintained on  Requip 1 mg Twice daily  .  Says she is doing well.   Pt states her sleep is doing well on requip and has no new complaints at this time.   Denies chest pain, orthopnea , edema or fever.   Review of Systems Constitutional:   No  weight loss, night sweats,  Fevers, chills, fatigue, or  lassitude.  HEENT:   No headaches,  Difficulty swallowing,  Tooth/dental problems, or  Sore throat,                No sneezing, itching, ear ache, nasal congestion, post nasal drip,   CV:  No chest pain,  Orthopnea, PND, swelling in lower extremities, anasarca, dizziness, palpitations, syncope.   GI  No heartburn, indigestion, abdominal pain, nausea, vomiting, diarrhea, change in bowel habits, loss of appetite, bloody stools.   Resp: No shortness of breath with exertion or at rest.  No excess mucus, no productive cough,  No non-productive cough,  No coughing up of blood.  No change in color of mucus.  No wheezing.  No chest wall deformity  Skin: no rash or lesions.  GU: no dysuria, change in color of urine, no urgency or frequency.  No flank pain, no hematuria   MS:  No joint pain or swelling.  No decreased range of motion.  No back pain.  Psych:  No change in mood or affect. No depression or anxiety.  No memory loss.         Objective:   Physical Exam Filed Vitals:   05/01/15 1505  BP: 114/76  Pulse: 68  Temp: 98.3 F (36.8 C)  TempSrc: Oral  Height: 5\' 6"  (1.676 m)  Weight: 244 lb (110.678 kg)  SpO2: 98%   GEN: A/Ox3; pleasant , NAD, well nourished   HEENT:  [place]/AT,  EACs-clear, TMs-wnl, NOSE-clear, THROAT-clear, no lesions, no postnasal drip or exudate noted.   NECK:  Supple w/ fair ROM; no JVD; normal carotid  impulses w/o bruits; no thyromegaly or nodules palpated; no lymphadenopathy.  RESP  Clear  P & A; w/o, wheezes/ rales/ or rhonchi.no accessory muscle use, no dullness to percussion  CARD:  RRR, no m/r/g  , no peripheral edema, pulses intact, no cyanosis or clubbing.  GI:   Soft & nt; nml bowel sounds; no organomegaly or masses detected.  Musco: Warm bil, no deformities or joint swelling noted.   Neuro: alert, no focal deficits noted.    Skin: Warm, no lesions or rashes  [name] NP-C  [facility]  05/01/15        Assessment & Plan:

## 2015-05-09 ENCOUNTER — Other Ambulatory Visit: Payer: Self-pay | Admitting: Internal Medicine

## 2015-05-09 NOTE — Telephone Encounter (Signed)
Pt is requesting refill on Trazodone.  Last OV: 03/17/2015 Last Fill: 11/08/2014 #60 and 5RF UDS: 09/13/2014 Low risk  Please advise.

## 2015-05-09 NOTE — Telephone Encounter (Signed)
Rx sent 

## 2015-05-09 NOTE — Telephone Encounter (Signed)
Ok 60 and 5 RF 

## 2015-05-16 ENCOUNTER — Ambulatory Visit (INDEPENDENT_AMBULATORY_CARE_PROVIDER_SITE_OTHER): Payer: Medicare Other | Admitting: Psychology

## 2015-05-16 DIAGNOSIS — F332 Major depressive disorder, recurrent severe without psychotic features: Secondary | ICD-10-CM | POA: Diagnosis not present

## 2015-05-18 ENCOUNTER — Other Ambulatory Visit: Payer: Self-pay | Admitting: Internal Medicine

## 2015-05-26 ENCOUNTER — Ambulatory Visit (INDEPENDENT_AMBULATORY_CARE_PROVIDER_SITE_OTHER): Payer: Medicare Other | Admitting: Psychology

## 2015-05-26 DIAGNOSIS — F332 Major depressive disorder, recurrent severe without psychotic features: Secondary | ICD-10-CM

## 2015-05-30 ENCOUNTER — Encounter: Payer: Self-pay | Admitting: Gastroenterology

## 2015-05-30 ENCOUNTER — Ambulatory Visit (AMBULATORY_SURGERY_CENTER): Payer: Medicare Other | Admitting: Gastroenterology

## 2015-05-30 VITALS — BP 117/57 | HR 56 | Temp 98.2°F | Resp 13 | Ht 66.0 in | Wt 248.0 lb

## 2015-05-30 DIAGNOSIS — K2270 Barrett's esophagus without dysplasia: Secondary | ICD-10-CM

## 2015-05-30 DIAGNOSIS — E669 Obesity, unspecified: Secondary | ICD-10-CM | POA: Diagnosis not present

## 2015-05-30 DIAGNOSIS — K209 Esophagitis, unspecified: Secondary | ICD-10-CM | POA: Diagnosis not present

## 2015-05-30 DIAGNOSIS — I10 Essential (primary) hypertension: Secondary | ICD-10-CM | POA: Diagnosis not present

## 2015-05-30 MED ORDER — SODIUM CHLORIDE 0.9 % IV SOLN: 500.0000 mL | INTRAVENOUS | Status: DC

## 2015-05-30 NOTE — Patient Instructions (Signed)
Continue your Omeprazole. Continue with your Zantac at bedtime.   YOU HAD AN ENDOSCOPIC PROCEDURE TODAY AT [facility]:   Refer to the procedure report that was given to you for any specific questions about what was found during the examination.  If the procedure report does not answer your questions, please call your gastroenterologist to clarify.  If you requested that your care partner not be given the details of your procedure findings, then the procedure report has been included in a sealed envelope for you to review at your convenience later.  YOU SHOULD EXPECT: Some feelings of bloating in the abdomen. Passage of more gas than usual.  Walking can help get rid of the air that was put into your GI tract during the procedure and reduce the bloating. If you had a lower endoscopy (such as a colonoscopy or flexible sigmoidoscopy) you may notice spotting of blood in your stool or on the toilet paper. If you underwent a bowel prep for your procedure, you may not have a normal bowel movement for a few days.  Please Note:  You might notice some irritation and congestion in your nose or some drainage.  This is from the oxygen used during your procedure.  There is no need for concern and it should clear up in a day or so.  SYMPTOMS TO REPORT IMMEDIATELY:    Following upper endoscopy (EGD)  Vomiting of blood or coffee ground material  New chest pain or pain under the shoulder blades  Painful or persistently difficult swallowing  New shortness of breath  Fever of 100F or higher  Black, tarry-looking stools  For urgent or emergent issues, a gastroenterologist can be reached at any hour by calling [phone].   DIET: Your first meal following the procedure should be a small meal and then it is ok to progress to your normal diet. Heavy or fried foods are harder to digest and may make you feel nauseous or bloated.  Likewise, meals heavy in dairy and vegetables can increase  bloating.  Drink plenty of fluids but you should avoid alcoholic beverages for 24 hours.  ACTIVITY:  You should plan to take it easy for the rest of today and you should NOT DRIVE or use heavy machinery until tomorrow (because of the sedation medicines used during the test).    FOLLOW UP: Our staff will call the number listed on your records the next business day following your procedure to check on you and address any questions or concerns that you may have regarding the information given to you following your procedure. If we do not reach you, we will leave a message.  However, if you are feeling well and you are not experiencing any problems, there is no need to return our call.  We will assume that you have returned to your regular daily activities without incident.  If any biopsies were taken you will be contacted by phone or by letter within the next 1-3 weeks.  Please call us at [phone] if you have not heard about the biopsies in 3 weeks.    SIGNATURES/CONFIDENTIALITY: You and/or your care partner have signed paperwork which will be entered into your electronic medical record.  These signatures attest to the fact that that the information above on your After Visit Summary has been reviewed and is understood.  Full responsibility of the confidentiality of this discharge information lies with you and/or your care-partner.

## 2015-05-30 NOTE — Progress Notes (Signed)
Called to room to assist during endoscopic procedure.  Patient ID and intended procedure confirmed with present staff. Received instructions for my participation in the procedure from the performing physician.  

## 2015-05-30 NOTE — Progress Notes (Signed)
To recovery, report to Tyrell, RN, VSS. 

## 2015-06-02 ENCOUNTER — Telehealth: Payer: Self-pay | Admitting: *Deleted

## 2015-06-02 NOTE — Op Note (Signed)
[facility] Patient Name: [name] Procedure Date: 05/30/2015 1:19 PM MRN: [id number] Endoscopist: Ladene Artist , MD Age: 70 Date of Birth: 1945-07-11 Gender: Female Procedure:                Upper GI endoscopy Indications:              Surveillance for malignancy due to personal history                            of Barrett's esophagus Medicines:                Monitored Anesthesia Care Procedure:                Pre-Anesthesia Assessment:                           - Prior to the procedure, a History and Physical                            was performed, and patient medications and                            allergies were reviewed. The patient's tolerance of                            previous anesthesia was also reviewed. The risks                            and benefits of the procedure and the sedation                            options and risks were discussed with the patient.                            All questions were answered, and informed consent                            was obtained. Prior Anticoagulants: The patient has                            taken no previous anticoagulant or antiplatelet                            agents. ASA Grade Assessment: III - A patient with                            severe systemic disease. After reviewing the risks                            and benefits, the patient was deemed in                            satisfactory condition to undergo the procedure.  After obtaining informed consent, the endoscope was                            passed under direct vision. Throughout the                            procedure, the patient's blood pressure, pulse, and                            oxygen saturations were monitored continuously. The                            Model GIF-HQ190 [id number]) scope was introduced                            through the mouth, and advanced to the second part               of duodenum. The upper GI endoscopy was                            accomplished without difficulty. The patient                            tolerated the procedure well. Scope In: Scope Out: Findings:                 There were esophageal mucosal changes secondary to                            established short-segment Barrett's disease present                            in the distal esophagus. The maximum longitudinal                            extent of these mucosal changes was 1 cm in length.                            Mucosa was biopsied with a cold forceps for                            histology in 4 quadrants at intervals of 1 cm.                           The exam of the esophagus was otherwise normal.                           A small hiatal hernia was present.                           The exam of the stomach was otherwise normal.                           The cardia and gastric fundus were otherwise normal  on retroflexion.                           The duodenal bulb and second portion of the                            duodenum were normal. Complications:            No immediate complications. Estimated Blood Loss:     Estimated blood loss: none. Impression:               - Esophageal mucosal changes secondary to                            established short-segment Barrett's disease.                            Biopsied.                           - Small hiatal hernia. Recommendation:           - Patient has a contact number available for                            emergencies. The signs and symptoms of potential                            delayed complications were discussed with the                            patient. Return to normal activities tomorrow.                            Written discharge instructions were provided to the                            patient.                           - Resume previous diet.                            - Continue present medications.                           - Await pathology results.                           - Repeat upper endoscopy in 3 years for                            surveillance if no dysplasia. Ladene Artist, MD 06/02/2015 8:09:54 AM This report has been signed electronically.

## 2015-06-02 NOTE — Telephone Encounter (Signed)
  Follow up Call-  Call back number 05/30/2015 12/18/2014  Post procedure Call Back phone  # [phone]  Permission to leave phone message Yes Yes     Patient questions:  Do you have a fever, pain , or abdominal swelling? No. Pain Score  0 *  Have you tolerated food without any problems? Yes.    Have you been able to return to your normal activities? Yes.    Do you have any questions about your discharge instructions: Diet   No. Medications  No. Follow up visit  No.  Do you have questions or concerns about your Care? No.  Actions: * If pain score is 4 or above: No action needed, pain <4.

## 2015-06-05 ENCOUNTER — Encounter: Payer: Self-pay | Admitting: Gastroenterology

## 2015-06-13 ENCOUNTER — Ambulatory Visit (INDEPENDENT_AMBULATORY_CARE_PROVIDER_SITE_OTHER): Payer: Medicare Other | Admitting: Psychology

## 2015-06-13 DIAGNOSIS — F332 Major depressive disorder, recurrent severe without psychotic features: Secondary | ICD-10-CM

## 2015-06-26 DIAGNOSIS — M25511 Pain in right shoulder: Secondary | ICD-10-CM | POA: Diagnosis not present

## 2015-06-26 DIAGNOSIS — M25512 Pain in left shoulder: Secondary | ICD-10-CM | POA: Diagnosis not present

## 2015-06-27 ENCOUNTER — Ambulatory Visit (INDEPENDENT_AMBULATORY_CARE_PROVIDER_SITE_OTHER): Payer: Medicare Other | Admitting: Psychology

## 2015-06-27 DIAGNOSIS — F332 Major depressive disorder, recurrent severe without psychotic features: Secondary | ICD-10-CM

## 2015-06-30 ENCOUNTER — Ambulatory Visit: Payer: Federal, State, Local not specified - PPO | Admitting: Internal Medicine

## 2015-06-30 ENCOUNTER — Telehealth: Payer: Self-pay | Admitting: Internal Medicine

## 2015-07-04 NOTE — Telephone Encounter (Signed)
No Charge 

## 2015-07-04 NOTE — Telephone Encounter (Signed)
Pt came in late for appt 06/30/15 and has rescheduled for 07/10/15, charge or no charge?

## 2015-07-10 ENCOUNTER — Encounter: Payer: Self-pay | Admitting: Internal Medicine

## 2015-07-10 ENCOUNTER — Ambulatory Visit (INDEPENDENT_AMBULATORY_CARE_PROVIDER_SITE_OTHER): Payer: Medicare Other | Admitting: Internal Medicine

## 2015-07-10 VITALS — BP 112/46 | HR 81 | Temp 97.7°F | Ht 66.0 in | Wt 244.0 lb

## 2015-07-10 DIAGNOSIS — M791 Myalgia, unspecified site: Secondary | ICD-10-CM | POA: Insufficient documentation

## 2015-07-10 DIAGNOSIS — I10 Essential (primary) hypertension: Secondary | ICD-10-CM | POA: Diagnosis not present

## 2015-07-10 DIAGNOSIS — E538 Deficiency of other specified B group vitamins: Secondary | ICD-10-CM | POA: Diagnosis not present

## 2015-07-10 DIAGNOSIS — G43C0 Periodic headache syndromes in child or adult, not intractable: Secondary | ICD-10-CM

## 2015-07-10 DIAGNOSIS — E785 Hyperlipidemia, unspecified: Secondary | ICD-10-CM

## 2015-07-10 MED ORDER — CYANOCOBALAMIN 1000 MCG/ML IJ SOLN
1000.0000 ug | Freq: Once | INTRAMUSCULAR | Status: AC
  Administered 2015-07-10: 1000 ug via INTRAMUSCULAR

## 2015-07-10 MED ORDER — HYDROCODONE-ACETAMINOPHEN 10-325 MG PO TABS: 1.0000 | ORAL_TABLET | ORAL | Status: DC | PRN

## 2015-07-10 NOTE — Patient Instructions (Addendum)
GO TO THE FRONT DESK Schedule labs to be done tomorrow, fasting   Stop atenolol  Check the  blood pressure 2 or 3 times a   Week  Be sure your blood pressure is between 110/65 and  145/85. If it is consistently higher or lower, let me know   HOLD Pravachol for 4 weeks, see if that helps with aches and pains

## 2015-07-10 NOTE — Assessment & Plan Note (Signed)
HTN: Diastolic BP slightly low, patient concern, okay to discontinue BB and continue Lotensin. Watch for increased migraines. Hyperlipidemia: On Pravachol, check a FLP, AST ALT. After labs, hold Pravachol for 4 weeks and see if that helps with aches and pains. Pain mngmt: today pt reports that back pain has been under Workers Comp care, I don't do WC, I can rx pain meds but need to look for a WC provider  Myalgias: Mostly at the deltoid areas, she has occasional headaches but has a history of migraines. Will check a sedimentation rate and CK. Migraine headaches: Apparently Topamax is not working well, see HPI,  recommend to discuss with the wellness Center RTC 09-2015, CPX

## 2015-07-10 NOTE — Progress Notes (Signed)
Pre visit review using our clinic tool,if applicable. No additional management support is needed unless otherwise documented below in the visit note.  

## 2015-07-10 NOTE — Progress Notes (Signed)
Subjective:    Patient ID: [name], female    DOB: 1945/10/27, 70 y.o.   MRN: [id number]  DOS:  07/10/2015 Type of visit - description : rov Interval history: Needs a B12 shot Needs to discuss pain management, previously was under Worker's Compensation HTN: Diastolic BP is a slightly low, she feels tired today, wonders if she could stop some of her BP medication Not taking Topamax daily, "sometimes when I don't take it my headaches are less frequent"   BP Readings from Last 3 Encounters:  07/10/15 112/46  05/30/15 117/57  05/01/15 114/76     Review of Systems Reports aches and pains, mostly at the deltoid area, persisting for few years. + wt loss --- states  is trying to eat healthier. No fever chills No chest pain or difficulty breathing  Past Medical History  Diagnosis Date  . Hyperlipidemia   . HTN (hypertension)   . GERD     GERD and HH  . Depression   . Insomnia   . Barrett's esophagus   . RLS (restless legs syndrome)   . Family history of anesthesia complication     Mother had severe N/V  . Migraine   . DJD (degenerative joint disease)     bilateral hands; knees  . Chronic lower back pain   . H/O cardiac catheterization     (-) cath 12-2002  , cath again 2011 (-)  . Vitamin B 12 deficiency 04/09/2013  . C. difficile diarrhea 08/01/2012    severe 2014  . Bilateral carpal tunnel syndrome     Dr. Eddie Dibbles, having injection therapy  . Anxiety   . GERD (gastroesophageal reflux disease)   . Osteoporosis     pt unsure of this  . Chronic cystitis     Dr. Matilde Sprang  . Anemia     Past Surgical History  Procedure Laterality Date  . Vaginal hysterectomy      for endometriosis  . Oophorectomy    . Bilateral knee arthroscopy Bilateral   . Foot surgery Bilateral     toenails removed; callus removed on right; hammertoes right"  . Arm surgery Left     "don't remember what they did; arm wasn't broken"  . Colonoscopy w/ polypectomy    . Cardiac catheterization   01/22/10    clean cath  . Shoulder arthroscopy Right   . Lumbar disc surgery      L5 S1 anterior fusion  . Cataract extraction w/ intraocular lens  implant, bilateral Bilateral   . Knot      "removed from right neck; not a goiter"  . Total knee arthroplasty  10/05/2011    Procedure: TOTAL KNEE ARTHROPLASTY;  Surgeon: Hessie Dibble, MD;  Location: [facility];  Service: Orthopedics;  Laterality: Right;  . Colonoscopy      Social History   Social History  . Marital Status: Divorced    Spouse Name: N/A  . Number of Children: 1  . Years of Education: N/A   Occupational History  . Retired, post office     Social History Main Topics  . Smoking status: Former Smoker -- 0.50 packs/day for 10 years    Quit date: 02/09/1975  . Smokeless tobacco: Never Used     Comment: started at age 20.   quit in the 1970s.  . Alcohol Use: 0.0 oz/week    0 Standard drinks or equivalent per week     Comment: 10/05/2011 "socially;  wine or mixed drink at the  most once/month"  . Drug Use: No  . Sexual Activity: Yes   Other Topics Concern  . Not on file   Social History Narrative   Son lives w/ her         Medication List       This list is accurate as of: 07/10/15  5:20 PM.  Always use your most recent med list.               aspirin EC 81 MG tablet  Take 81 mg by mouth daily.     benazepril 20 MG tablet  Commonly known as:  LOTENSIN  Take 1 tablet (20 mg total) by mouth daily.     CALCIUM PLUS VITAMIN D PO  Take by mouth daily.     clonazePAM 0.5 MG tablet  Commonly known as:  KLONOPIN  Take 1 tablet (0.5 mg total) by mouth 3 (three) times daily as needed for anxiety.     HYDROcodone-acetaminophen 10-325 MG tablet  Commonly known as:  NORCO  Take 1 tablet by mouth every 4 (four) hours as needed. For back pain     MYRBETRIQ 25 MG Tb24 tablet  Generic drug:  mirabegron ER  Take 25 mg by mouth daily.     omeprazole 40 MG capsule  Commonly known as:  PRILOSEC  TAKE 1 CAPSULE (40  MG TOTAL) BY MOUTH 2 (TWO) TIMES DAILY.     pravastatin 40 MG tablet  Commonly known as:  PRAVACHOL  Take 1 tablet (40 mg total) by mouth daily.     ranitidine 300 MG tablet  Commonly known as:  ZANTAC  TAKE 1 TABLET (300 MG TOTAL) BY MOUTH AT BEDTIME.     rOPINIRole 1 MG tablet  Commonly known as:  REQUIP  TAKE 1 TABLET (1 MG TOTAL) BY MOUTH 2 (TWO) TIMES DAILY.     topiramate 200 MG tablet  Commonly known as:  TOPAMAX  Take 400 mg by mouth 2 (two) times daily. Take 1.5 by mouth at bedtime     traZODone 50 MG tablet  Commonly known as:  DESYREL  Take 1-2 tablets (50-100 mg total) by mouth at bedtime.     venlafaxine XR 75 MG 24 hr capsule  Commonly known as:  EFFEXOR-XR  Take 3 capsules (225 mg total) by mouth daily with breakfast.           Objective:   Physical Exam BP 112/46 mmHg  Pulse 81  Temp(Src) 97.7 F (36.5 C) (Oral)  Ht 5\' 6"  (1.676 m)  Wt 244 lb (110.678 kg)  BMI 39.40 kg/m2  SpO2 98% General:   Well developed, well nourished . NAD.  HEENT:  Normocephalic . Face symmetric, atraumatic Lungs:  CTA B Normal respiratory effort, no intercostal retractions, no accessory muscle use. Heart: RRR,  no murmur.  No pretibial edema bilaterally  Skin: Not pale. Not jaundice Neurologic:  alert & oriented X3.  Speech normal, gait limited by DJD Psych--  Cognition and judgment appear intact.  Cooperative with normal attention span and concentration.  Behavior appropriate. No anxious or depressed appearing.      Assessment & Plan:   Assessment HTN Hyperlipidemia Depression (effexor) , anxiety (clonazepam),  Insomnia (trazodone) GI:  --GERD, Barrett's esophagus, EGD 05-2015 --Persistent C. difficile diarrhea 2014 B12 deficiency Osteopenia - Bone density test 09-2013 showed osteopenia, on calcium, vitamin D RLS - Requip, see Pulmonary Migraines, f/u [facility] -- on topamax MSK: ---- pain meds rx by ortho, Dr Eddie Dibbles retired  thus pcp will rx if needed  as off 03-1015, takes < than 2-3 qd  ----DJD ----Carpal tunnel syndrome bilaterally ----Chronic low back pain OAB, LUTS-- on myrbetriq Dr McDarmoth  H/o Cardiac catheterization 2011 and 2014 negative  PLAN HTN: Diastolic BP slightly low, patient concern, okay to discontinue BB and continue Lotensin. Watch for increased migraines. Hyperlipidemia: On Pravachol, check a FLP, AST ALT. After labs, hold Pravachol for 4 weeks and see if that helps with aches and pains. Pain mngmt: today pt reports that back pain has been under Workers Comp care, I don't do WC, I can rx pain meds but need to look for a WC provider  Myalgias: Mostly at the deltoid areas, she has occasional headaches but has a history of migraines. Will check a sedimentation rate and CK. Migraine headaches: Apparently Topamax is not working well, see HPI,  recommend to discuss with the wellness Center RTC 09-2015, CPX

## 2015-07-11 ENCOUNTER — Ambulatory Visit (INDEPENDENT_AMBULATORY_CARE_PROVIDER_SITE_OTHER): Payer: Medicare Other | Admitting: Psychology

## 2015-07-11 ENCOUNTER — Other Ambulatory Visit: Payer: Self-pay

## 2015-07-11 DIAGNOSIS — F332 Major depressive disorder, recurrent severe without psychotic features: Secondary | ICD-10-CM | POA: Diagnosis not present

## 2015-07-14 ENCOUNTER — Other Ambulatory Visit (INDEPENDENT_AMBULATORY_CARE_PROVIDER_SITE_OTHER): Payer: Medicare Other

## 2015-07-14 DIAGNOSIS — M791 Myalgia, unspecified site: Secondary | ICD-10-CM

## 2015-07-14 DIAGNOSIS — E785 Hyperlipidemia, unspecified: Secondary | ICD-10-CM

## 2015-07-14 LAB — LIPID PANEL
Cholesterol: 171 mg/dL (ref 0–200)
HDL: 47.2 mg/dL (ref >39.00)
LDL Cholesterol: 101 mg/dL — ABNORMAL HIGH (ref 0–99)
NonHDL: 123.68
Total CHOL/HDL Ratio: 4
Triglycerides: 112 mg/dL (ref 0.0–149.0)
VLDL: 22.4 mg/dL (ref 0.0–40.0)

## 2015-07-14 LAB — SEDIMENTATION RATE: Sed Rate: 19 mm/hr (ref 0–30)

## 2015-07-14 LAB — ALT: ALT: 30 U/L (ref 0–35)

## 2015-07-14 LAB — CK: Total CK: 281 U/L — ABNORMAL HIGH (ref 7–177)

## 2015-07-14 LAB — AST: AST: 27 U/L (ref 0–37)

## 2015-07-18 ENCOUNTER — Telehealth: Payer: Self-pay | Admitting: Internal Medicine

## 2015-07-18 NOTE — Telephone Encounter (Signed)
Spoke w/ Pt, informed her I received call from [facility] requesting refill on Atenolol. Informed her that per Dr. Larose Kells at her last OV they had discussed d/c Atenolol. Pt stated she forgot that Dr. Larose Kells did say that and requested I NOT send a refill for the Atenolol. Informed her to let us know if BP's start to become elevated. Pt verbalized understanding.

## 2015-07-18 NOTE — Telephone Encounter (Signed)
Please advise if Pt should continue taking Atenolol.

## 2015-07-18 NOTE — Telephone Encounter (Signed)
Caller name: Jenny Reichmann with [facility]  Can be reached: [phone]   Reason for call: pt requested refill on atenolol. I do not see on active med list. Please f/u with pharmacy if med to be d/c or send in new RX.

## 2015-07-18 NOTE — Telephone Encounter (Signed)
At the last office visit she said she was tired, we agreed to stop atenolol. If BP is high or shed she feels she needs to go back on it okay to refill, same dosing.

## 2015-07-22 ENCOUNTER — Ambulatory Visit: Payer: Self-pay | Admitting: Pulmonary Disease

## 2015-07-25 ENCOUNTER — Ambulatory Visit (INDEPENDENT_AMBULATORY_CARE_PROVIDER_SITE_OTHER): Payer: Medicare Other | Admitting: Psychology

## 2015-07-25 DIAGNOSIS — F332 Major depressive disorder, recurrent severe without psychotic features: Secondary | ICD-10-CM | POA: Diagnosis not present

## 2015-07-30 DIAGNOSIS — N3941 Urge incontinence: Secondary | ICD-10-CM | POA: Diagnosis not present

## 2015-07-30 DIAGNOSIS — R350 Frequency of micturition: Secondary | ICD-10-CM | POA: Diagnosis not present

## 2015-08-13 ENCOUNTER — Ambulatory Visit (INDEPENDENT_AMBULATORY_CARE_PROVIDER_SITE_OTHER): Payer: Medicare Other | Admitting: Psychology

## 2015-08-13 ENCOUNTER — Ambulatory Visit (INDEPENDENT_AMBULATORY_CARE_PROVIDER_SITE_OTHER): Payer: Medicare Other | Admitting: Behavioral Health

## 2015-08-13 DIAGNOSIS — F332 Major depressive disorder, recurrent severe without psychotic features: Secondary | ICD-10-CM

## 2015-08-13 DIAGNOSIS — E538 Deficiency of other specified B group vitamins: Secondary | ICD-10-CM

## 2015-08-13 MED ORDER — CYANOCOBALAMIN 1000 MCG/ML IJ SOLN
1000.0000 ug | Freq: Once | INTRAMUSCULAR | Status: AC
  Administered 2015-08-13: 1000 ug via INTRAMUSCULAR

## 2015-08-13 NOTE — Progress Notes (Signed)
Pre visit review using our clinic review tool, if applicable. No additional management support is needed unless otherwise documented below in the visit note.  Patient in clinic today for B12 injection. IM given in Left Deltoid. Patient tolerated injection well.

## 2015-08-29 ENCOUNTER — Ambulatory Visit (INDEPENDENT_AMBULATORY_CARE_PROVIDER_SITE_OTHER): Payer: Medicare Other | Admitting: Psychology

## 2015-08-29 DIAGNOSIS — F332 Major depressive disorder, recurrent severe without psychotic features: Secondary | ICD-10-CM | POA: Diagnosis not present

## 2015-09-15 ENCOUNTER — Encounter: Payer: Self-pay | Admitting: Internal Medicine

## 2015-09-15 ENCOUNTER — Ambulatory Visit (INDEPENDENT_AMBULATORY_CARE_PROVIDER_SITE_OTHER): Payer: Medicare Other | Admitting: Internal Medicine

## 2015-09-15 VITALS — BP 122/72 | HR 82 | Temp 98.0°F | Resp 14 | Ht 66.0 in | Wt 242.4 lb

## 2015-09-15 DIAGNOSIS — E538 Deficiency of other specified B group vitamins: Secondary | ICD-10-CM | POA: Diagnosis not present

## 2015-09-15 DIAGNOSIS — F329 Major depressive disorder, single episode, unspecified: Secondary | ICD-10-CM

## 2015-09-15 DIAGNOSIS — F32A Depression, unspecified: Secondary | ICD-10-CM

## 2015-09-15 DIAGNOSIS — E785 Hyperlipidemia, unspecified: Secondary | ICD-10-CM | POA: Diagnosis not present

## 2015-09-15 DIAGNOSIS — Z79891 Long term (current) use of opiate analgesic: Secondary | ICD-10-CM | POA: Diagnosis not present

## 2015-09-15 DIAGNOSIS — Z0000 Encounter for general adult medical examination without abnormal findings: Secondary | ICD-10-CM | POA: Diagnosis not present

## 2015-09-15 DIAGNOSIS — I10 Essential (primary) hypertension: Secondary | ICD-10-CM

## 2015-09-15 MED ORDER — CYANOCOBALAMIN 1000 MCG/ML IJ SOLN
1000.0000 ug | Freq: Once | INTRAMUSCULAR | Status: AC
  Administered 2015-09-15: 1000 ug via INTRAMUSCULAR

## 2015-09-15 NOTE — Assessment & Plan Note (Addendum)
Td 2000 and 2012 ;   Pneumonia shot-- 2012;  prevnar 2015;  shingles inmunization -- 11-2013  [facility]: Colonoscopy 03-2006,  Colonoscopy 10-11 , essentially negative  Colonoscopy 11- 2016, next per GI Female care  PAPS per Dr Deatra Ina, last visit 10-2014  MMG @ gyn 2016 per pt   counseled about diet and exercise

## 2015-09-15 NOTE — Patient Instructions (Signed)
Schedule labs to be done this week  Next visit in 3 months  Increase your physical activity     Fall Prevention and Home Safety Falls cause injuries and can affect all age groups. It is possible to use preventive measures to significantly decrease the likelihood of falls. There are many simple measures which can make your home safer and prevent falls. OUTDOORS  Repair cracks and edges of walkways and driveways.  Remove high doorway thresholds.  Trim shrubbery on the main path into your home.  Have good outside lighting.  Clear walkways of tools, rocks, debris, and clutter.  Check that handrails are not broken and are securely fastened. Both sides of steps should have handrails.  Have leaves, snow, and ice cleared regularly.  Use sand or salt on walkways during winter months.  In the garage, clean up grease or oil spills. BATHROOM  Install night lights.  Install grab bars by the toilet and in the tub and shower.  Use non-skid mats or decals in the tub or shower.  Place a plastic non-slip stool in the shower to sit on, if needed.  Keep floors dry and clean up all water on the floor immediately.  Remove soap buildup in the tub or shower on a regular basis.  Secure bath mats with non-slip, double-sided rug tape.  Remove throw rugs and tripping hazards from the floors. BEDROOMS  Install night lights.  Make sure a bedside light is easy to reach.  Do not use oversized bedding.  Keep a telephone by your bedside.  Have a firm chair with side arms to use for getting dressed.  Remove throw rugs and tripping hazards from the floor. KITCHEN  Keep handles on pots and pans turned toward the center of the stove. Use back burners when possible.  Clean up spills quickly and allow time for drying.  Avoid walking on wet floors.  Avoid hot utensils and knives.  Position shelves so they are not too high or low.  Place commonly used objects within easy reach.  If  necessary, use a sturdy step stool with a grab bar when reaching.  Keep electrical cables out of the way.  Do not use floor polish or wax that makes floors slippery. If you must use wax, use non-skid floor wax.  Remove throw rugs and tripping hazards from the floor. STAIRWAYS  Never leave objects on stairs.  Place handrails on both sides of stairways and use them. Fix any loose handrails. Make sure handrails on both sides of the stairways are as long as the stairs.  Check carpeting to make sure it is firmly attached along stairs. Make repairs to worn or loose carpet promptly.  Avoid placing throw rugs at the top or bottom of stairways, or properly secure the rug with carpet tape to prevent slippage. Get rid of throw rugs, if possible.  Have an electrician put in a light switch at the top and bottom of the stairs. OTHER FALL PREVENTION TIPS  Wear low-heel or rubber-soled shoes that are supportive and fit well. Wear closed toe shoes.  When using a stepladder, make sure it is fully opened and both spreaders are firmly locked. Do not climb a closed stepladder.  Add color or contrast paint or tape to grab bars and handrails in your home. Place contrasting color strips on first and last steps.  Learn and use mobility aids as needed. Install an electrical emergency response system.  Turn on lights to avoid dark areas. Replace light bulbs  that burn out immediately. Get light switches that glow.  Arrange furniture to create clear pathways. Keep furniture in the same place.  Firmly attach carpet with non-skid or double-sided tape.  Eliminate uneven floor surfaces.  Select a carpet pattern that does not visually hide the edge of steps.  Be aware of all pets. OTHER HOME SAFETY TIPS  Set the water temperature for 120 F (48.8 C).  Keep emergency numbers on or near the telephone.  Keep smoke detectors on every level of the home and near sleeping areas. Document Released: 01/15/2002  Document Revised: 07/27/2011 Document Reviewed: 04/16/2011 [facility] Patient Information 2015 [place], [place]. This information is not intended to replace advice given to you by your health care provider. Make sure you discuss any questions you have with your health care provider.   Preventive Care for Adults Ages 57 and over  Blood pressure check.** / Every 1 to 2 years.  Lipid and cholesterol check.**/ Every 5 years beginning at age 81.  Lung cancer screening. / Every year if you are aged 44-80 years and have a 30-pack-year history of smoking and currently smoke or have quit within the past 15 years. Yearly screening is stopped once you have quit smoking for at least 15 years or develop a health problem that would prevent you from having lung cancer treatment.  Fecal occult blood test (FOBT) of stool. / Every year beginning at age 58 and continuing until age 53. You may not have to do this test if you get a colonoscopy every 10 years.  Flexible sigmoidoscopy** or colonoscopy.** / Every 5 years for a flexible sigmoidoscopy or every 10 years for a colonoscopy beginning at age 63 and continuing until age 72.  Hepatitis C blood test.** / For all people born from [birth year] through 1966 and any individual with known risks for hepatitis C.  Abdominal aortic aneurysm (AAA) screening.** / A one-time screening for ages 67 to 58 years who are current or former smokers.  Skin self-exam. / Monthly.  Influenza vaccine. / Every year.  Tetanus, diphtheria, and acellular pertussis (Tdap/Td) vaccine.** / 1 dose of Td every 10 years.  Varicella vaccine.** / Consult your health care provider.  Zoster vaccine.** / 1 dose for adults aged 73 years or older.  Pneumococcal 13-valent conjugate (PCV13) vaccine.** / Consult your health care provider.  Pneumococcal polysaccharide (PPSV23) vaccine.** / 1 dose for all adults aged 63 years and older.  Meningococcal vaccine.** / Consult your health care  provider.  Hepatitis A vaccine.** / Consult your health care provider.  Hepatitis B vaccine.** / Consult your health care provider.  Haemophilus influenzae type b (Hib) vaccine.** / Consult your health care provider. **Family history and personal history of risk and conditions may change your health care provider's recommendations. Document Released: 03/23/2001 Document Revised: 01/30/2013 Document Reviewed: 06/22/2010 [facility] Patient Information 2015 [place], [place]. This information is not intended to replace advice given to you by your health care provider. Make sure you discuss any questions you have with your health care provider.

## 2015-09-15 NOTE — Progress Notes (Signed)
Subjective:    Patient ID: [name], female    DOB: 1945/07/11, 70 y.o.   MRN: [id number]  DOS:  09/15/2015 Type of visit - description :  Interval history: Here for Medicare AWV:   1. Risk factors based on Past M, S, F history: reviewed 2. Physical Activities: active w/ home chores 3. Depression/mood: on meds, good-bad days, no suicidal, slt worse than 2016? , sees a counselor  , see below  4. Hearing: H/o tinnitus, at baseline, s/p ENT eval 5. ADL's: independent, drives  6. Fall Risk: no recent falls, prevention discussed , see AVS 7. home Safety: does feel safe at home  8. Height, weight, & visual acuity: see VS, sees eye doctor regulalrly, due for a visit, encouraged to call 9. Counseling: provided 10. Labs ordered based on risk factors: if needed  11. Referral Coordination: if needed 12. Care Plan, see assessment and plan , written personalized plan provided , see AVS 13. Cognitive Assessment: motor skills and cognition appropriate for age 13. Care team updated   15. End-of-life care discussed, has a HC POA   In addition, today we discussed the following: HTN: Good med compliance, ambulatory BPs within normal when checked. High cholesterol: See last visit, Pravachol held, aches disappeared within 3 days. Has not gone back on statins Depression: Good med compliance, still has symptoms.  Review of Systems  Constitutional: No fever. No chills. No unexplained wt changes. No unusual sweats  HEENT: No dental problems, no ear discharge, no facial swelling, no voice changes. No eye discharge, no eye  redness , no  intolerance to light   Respiratory: No wheezing , no actual difficulty breathing except when she gets panicky.. No cough , no mucus production  Cardiovascular: No CP, no leg swelling , no  Palpitations  GI: no nausea, no vomiting, no diarrhea , no  abdominal pain.  No blood in the stools. No dysphagia, no odynophagia    Endocrine: No polyphagia, no polyuria ,  no polydipsia  GU: No dysuria, gross hematuria, difficulty urinating. No urinary urgency, no frequency.  Musculoskeletal: No joint swellings or unusual aches or pains  Skin: No change in the color of the skin, palor , no  Rash  Allergic, immunologic: No environmental allergies , no  food allergies  Neurological: + Dizziness mostly when she stands up or turns her head. No syncope. Headaches not far from baseline.   No diplopia, no slurred, no slurred speech, no motor deficits, no facial  Numbness  Hematological: No enlarged lymph nodes, no easy bruising , no unusual bleedings  Psychiatry: No suicidal ideas, no hallucinations, no beavior problems, no confusion.  No unusual/severe anxiety, no depression  Past Medical History:  Diagnosis Date  . Anemia   . Anxiety   . Barrett's esophagus   . Bilateral carpal tunnel syndrome    Dr. Eddie Dibbles, having injection therapy  . C. difficile diarrhea 08/01/2012   severe 2014  . Chronic cystitis    Dr. Matilde Sprang  . Chronic lower back pain   . Depression   . DJD (degenerative joint disease)    bilateral hands; knees  . Family history of anesthesia complication    Mother had severe N/V  . GERD    GERD and HH  . GERD (gastroesophageal reflux disease)   . H/O cardiac catheterization    (-) cath 12-2002  , cath again 2011 (-)  . HTN (hypertension)   . Hyperlipidemia   . Insomnia   . Migraine   .  Osteoporosis    pt unsure of this  . RLS (restless legs syndrome)   . Vitamin B 12 deficiency 04/09/2013    Past Surgical History:  Procedure Laterality Date  . Arm surgery Left    "don't remember what they did; arm wasn't broken"  . BILATERAL KNEE ARTHROSCOPY Bilateral   . CARDIAC CATHETERIZATION  01/22/10   clean cath  . CATARACT EXTRACTION W/ INTRAOCULAR LENS  IMPLANT, BILATERAL Bilateral   . COLONOSCOPY W/ POLYPECTOMY    . FOOT SURGERY Bilateral    toenails removed; callus removed on right; hammertoes right"  . Knot     "removed from  right neck; not a goiter"  . LUMBAR DISC SURGERY     L5 S1 anterior fusion  . OOPHORECTOMY    . SHOULDER ARTHROSCOPY Right   . TOTAL KNEE ARTHROPLASTY  10/05/2011   Procedure: TOTAL KNEE ARTHROPLASTY;  Surgeon: Hessie Dibble, MD;  Location: [facility];  Service: Orthopedics;  Laterality: Right;  [name] VAGINAL HYSTERECTOMY     for endometriosis    Social History   Social History  . Marital status: Divorced    Spouse name: N/A  . Number of children: 1  . Years of education: N/A   Occupational History  . Retired, post office  Retired   Social History Main Topics  . Smoking status: Former Smoker    Packs/day: 0.50    Years: 10.00    Quit date: 02/09/1975  . Smokeless tobacco: Never Used     Comment: started at age 57.   quit in the 1970s.  . Alcohol use 0.0 oz/week     Comment: 10/05/2011 "socially;  wine or mixed drink at the most once/month"  . Drug use: No  . Sexual activity: Yes   Other Topics Concern  . Not on file   Social History Narrative   Son lives w/ her      Family History  Problem Relation Age of Onset  . Lung cancer Mother   . Dementia Father   . CAD Father     dx in his 58s  . Stroke Father   . Colon cancer Maternal Grandfather   . Esophageal cancer Brother   . Breast cancer Sister   . Diabetes Other     Aunt  . Colon cancer Maternal Uncle     2  . Rectal cancer Neg Hx   . Stomach cancer Neg Hx        Medication List       Accurate as of 09/15/15 11:59 PM. Always use your most recent med list.          aspirin EC 81 MG tablet Take 81 mg by mouth daily.   benazepril 20 MG tablet Commonly known as:  LOTENSIN Take 1 tablet (20 mg total) by mouth daily.   CALCIUM PLUS VITAMIN D PO Take by mouth daily.   clonazePAM 0.5 MG tablet Commonly known as:  KLONOPIN Take 1 tablet (0.5 mg total) by mouth 3 (three) times daily as needed for anxiety.   HYDROcodone-acetaminophen 10-325 MG tablet Commonly known as:  NORCO Take 1 tablet by mouth every 4  (four) hours as needed. For back pain   MYRBETRIQ 25 MG Tb24 tablet Generic drug:  mirabegron ER Take 25 mg by mouth daily.   omeprazole 40 MG capsule Commonly known as:  PRILOSEC TAKE 1 CAPSULE (40 MG TOTAL) BY MOUTH 2 (TWO) TIMES DAILY.   ranitidine 300 MG tablet Commonly known as:  ZANTAC  TAKE 1 TABLET (300 MG TOTAL) BY MOUTH AT BEDTIME.   rOPINIRole 1 MG tablet Commonly known as:  REQUIP TAKE 1 TABLET (1 MG TOTAL) BY MOUTH 2 (TWO) TIMES DAILY.   topiramate 200 MG tablet Commonly known as:  TOPAMAX Take 400 mg by mouth 2 (two) times daily. Take 1.5 by mouth at bedtime   traZODone 50 MG tablet Commonly known as:  DESYREL Take 1-2 tablets (50-100 mg total) by mouth at bedtime.   venlafaxine XR 75 MG 24 hr capsule Commonly known as:  EFFEXOR-XR Take 3 capsules (225 mg total) by mouth daily with breakfast.          Objective:   Physical Exam BP 122/72 (BP Location: Left Arm, Patient Position: Sitting, Cuff Size: Normal)   Pulse 82   Temp 98 F (36.7 C) (Oral)   Resp 14   Ht 5\' 6"  (1.676 m)   Wt 242 lb 6 oz (109.9 kg)   SpO2 97%   BMI 39.12 kg/m   General:   Well developed, well nourished . NAD.  Neck: No  thyromegaly  HEENT:  Normocephalic . Face symmetric, atraumatic Lungs:  CTA B Normal respiratory effort, no intercostal retractions, no accessory muscle use. Heart: RRR,  no murmur.  No pretibial edema bilaterally  Abdomen:  Not distended, soft, non-tender. No rebound or rigidity.   Skin: Exposed areas without rash. Not pale. Not jaundice Neurologic:  alert & oriented X3.  Speech normal, gait appropriate for age and unassisted Strength symmetric and appropriate for age.  Psych: Cognition and judgment appear intact.  Cooperative with normal attention span and concentration.  Behavior appropriate. slt  Anxious but no  depressed appearing.    Assessment & Plan:   Assessment HTN Hyperlipidemia Depression: x many  years, remotely on zoloft and  other per psych; I rx lexapro 2015, then switch to effexor   anxiety (clonazepam),  Insomnia (trazodone) GI:  --GERD, Barrett's esophagus, EGD 05-2015 --Persistent C. difficile diarrhea 2014 B12 deficiency Osteopenia - Bone density test 09-2013 showed osteopenia, on calcium, vitamin D RLS - Requip, see Pulmonary Migraines, f/u [facility] -- on topamax MSK: ---- pain meds rx by ortho, Dr Eddie Dibbles retired thus pcp will rx if needed as off 03-1015, takes < than 2-3 qd  ----DJD ----Carpal tunnel syndrome bilaterally ----Chronic low back pain OAB, LUTS-- on myrbetriq Dr McDarmoth  H/o Cardiac catheterization 2011 and 2014 negative  PLAN HTN: Seems well-controlled, continue Lotensin. Check a BMP, CBC. Hyperlipidemia: Patient stopped Pravachol, UE myalgias went away within 3 days, decided not to go back on meds. Will check a FLP, consider a different statin. Low-dose Crestor? Depression, anxiety, insomnia: Depression not well controlled, this is going on for years, she  used to see psychiatry, took a number of meds, I prescribed Lexapro and subsequently Effexor in 2015, sx were never completely well. Not suicidal. Recommend to continue with counseling and refer to psychiatry. May benefit from more aggressive treatment.list of providers provided  Pain management: Sx seem control, UDS today b 12 def-- shot today Myalgias: Better after Pravachol stopped, CK and sedimentation rate normal. Breathing problems, dizziness: See description of the symptoms above, Rx observation for now. RTC 3 months

## 2015-09-15 NOTE — Progress Notes (Signed)
Pre visit review using our clinic review tool, if applicable. No additional management support is needed unless otherwise documented below in the visit note. 

## 2015-09-16 NOTE — Assessment & Plan Note (Signed)
HTN: Seems well-controlled, continue Lotensin. Check a BMP, CBC. Hyperlipidemia: Patient stopped Pravachol, UE myalgias went away within 3 days, decided not to go back on meds. Will check a FLP, consider a different statin. Low-dose Crestor? Depression, anxiety, insomnia: Depression not well controlled, this is going on for years, she  used to see psychiatry, took a number of meds, I prescribed Lexapro and subsequently Effexor in 2015, sx were never completely well. Not suicidal. Recommend to continue with counseling and refer to psychiatry. May benefit from more aggressive treatment.list of providers provided  Pain management: Sx seem control, UDS today b 12 def-- shot today Myalgias: Better after Pravachol stopped, CK and sedimentation rate normal. Breathing problems, dizziness: See description of the symptoms above, Rx observation for now. RTC 3 months

## 2015-09-18 ENCOUNTER — Other Ambulatory Visit (INDEPENDENT_AMBULATORY_CARE_PROVIDER_SITE_OTHER): Payer: Medicare Other

## 2015-09-18 DIAGNOSIS — I10 Essential (primary) hypertension: Secondary | ICD-10-CM

## 2015-09-18 DIAGNOSIS — E785 Hyperlipidemia, unspecified: Secondary | ICD-10-CM

## 2015-09-18 LAB — CBC WITH DIFFERENTIAL/PLATELET
Basophils Absolute: 0 10*3/uL (ref 0.0–0.1)
Basophils Relative: 0.3 % (ref 0.0–3.0)
Eosinophils Absolute: 0.1 10*3/uL (ref 0.0–0.7)
Eosinophils Relative: 0.7 % (ref 0.0–5.0)
HCT: 39.4 % (ref 36.0–46.0)
Hemoglobin: 13 g/dL (ref 12.0–15.0)
Lymphocytes Relative: 23.6 % (ref 12.0–46.0)
Lymphs Abs: 2.2 10*3/uL (ref 0.7–4.0)
MCHC: 33.1 g/dL (ref 30.0–36.0)
MCV: 91.6 fl (ref 78.0–100.0)
Monocytes Absolute: 0.6 10*3/uL (ref 0.1–1.0)
Monocytes Relative: 7 % (ref 3.0–12.0)
Neutro Abs: 6.3 10*3/uL (ref 1.4–7.7)
Neutrophils Relative %: 68.4 % (ref 43.0–77.0)
Platelets: 249 10*3/uL (ref 150.0–400.0)
RBC: 4.3 Mil/uL (ref 3.87–5.11)
RDW: 14.9 % (ref 11.5–15.5)
WBC: 9.1 10*3/uL (ref 4.0–10.5)

## 2015-09-18 LAB — BASIC METABOLIC PANEL
BUN: 18 mg/dL (ref 6–23)
CO2: 26 mEq/L (ref 19–32)
Calcium: 9.4 mg/dL (ref 8.4–10.5)
Chloride: 107 mEq/L (ref 96–112)
Creatinine, Ser: 0.94 mg/dL (ref 0.40–1.20)
GFR: 62.4 mL/min (ref >60.00)
Glucose, Bld: 115 mg/dL — ABNORMAL HIGH (ref 70–99)
Potassium: 4 mEq/L (ref 3.5–5.1)
Sodium: 140 mEq/L (ref 135–145)

## 2015-09-18 LAB — LIPID PANEL
Cholesterol: 239 mg/dL — ABNORMAL HIGH (ref 0–200)
HDL: 60.8 mg/dL (ref >39.00)
LDL Cholesterol: 158 mg/dL — ABNORMAL HIGH (ref 0–99)
NonHDL: 178.22
Total CHOL/HDL Ratio: 4
Triglycerides: 99 mg/dL (ref 0.0–149.0)
VLDL: 19.8 mg/dL (ref 0.0–40.0)

## 2015-09-19 MED ORDER — ROSUVASTATIN CALCIUM 5 MG PO TABS: 5.0000 mg | ORAL_TABLET | Freq: Every day | ORAL | 3 refills | Status: DC

## 2015-09-26 ENCOUNTER — Ambulatory Visit (INDEPENDENT_AMBULATORY_CARE_PROVIDER_SITE_OTHER): Payer: Medicare Other | Admitting: Psychology

## 2015-09-26 DIAGNOSIS — F332 Major depressive disorder, recurrent severe without psychotic features: Secondary | ICD-10-CM | POA: Diagnosis not present

## 2015-09-29 ENCOUNTER — Telehealth: Payer: Self-pay

## 2015-09-29 NOTE — Telephone Encounter (Signed)
Patient returning your call.

## 2015-09-29 NOTE — Telephone Encounter (Signed)
Pt denies taking any Ritalin, Adderall, weight loss medications, or herbal supplements except for Vit D3 and B12. Informed Pt of the presence of Amphetamines and informed her I would let PCP know and we would check next opportunity or at PCP discretion. Pt verbalized understanding.

## 2015-09-29 NOTE — Telephone Encounter (Signed)
UDS: 09/15/2015   Positive for Hydrocodone Positive for Amphetamines???  LMOM informing to her call office at earliest convenience. Per PCP, needs to know if she is taking Adderall, Ritalin, weight loss drugs.

## 2015-09-30 NOTE — Telephone Encounter (Signed)
Okay, will recheck another UDS in 2 months

## 2015-10-08 ENCOUNTER — Other Ambulatory Visit: Payer: Self-pay | Admitting: Internal Medicine

## 2015-10-10 ENCOUNTER — Other Ambulatory Visit: Payer: Self-pay | Admitting: Gastroenterology

## 2015-10-10 ENCOUNTER — Ambulatory Visit (INDEPENDENT_AMBULATORY_CARE_PROVIDER_SITE_OTHER): Payer: Medicare Other | Admitting: Psychology

## 2015-10-10 DIAGNOSIS — F332 Major depressive disorder, recurrent severe without psychotic features: Secondary | ICD-10-CM | POA: Diagnosis not present

## 2015-10-17 ENCOUNTER — Ambulatory Visit (INDEPENDENT_AMBULATORY_CARE_PROVIDER_SITE_OTHER): Payer: Medicare Other

## 2015-10-17 DIAGNOSIS — E538 Deficiency of other specified B group vitamins: Secondary | ICD-10-CM

## 2015-10-17 MED ORDER — CYANOCOBALAMIN 1000 MCG/ML IJ SOLN
1000.0000 ug | Freq: Once | INTRAMUSCULAR | Status: AC
  Administered 2015-10-17: 1000 ug via INTRAMUSCULAR

## 2015-10-17 NOTE — Progress Notes (Signed)
Pre visit review using our clinic review tool, if applicable. No additional management support is needed unless otherwise documented below in the visit note.  Pt in clinic today for B12 injection.  B12 injection given.  She tolerated injection well.  No signs of a reaction upon leaving the clinic.

## 2015-10-24 ENCOUNTER — Ambulatory Visit (INDEPENDENT_AMBULATORY_CARE_PROVIDER_SITE_OTHER): Payer: Medicare Other | Admitting: Psychology

## 2015-10-24 DIAGNOSIS — F332 Major depressive disorder, recurrent severe without psychotic features: Secondary | ICD-10-CM | POA: Diagnosis not present

## 2015-10-30 ENCOUNTER — Other Ambulatory Visit (INDEPENDENT_AMBULATORY_CARE_PROVIDER_SITE_OTHER): Payer: Medicare Other

## 2015-10-30 DIAGNOSIS — E785 Hyperlipidemia, unspecified: Secondary | ICD-10-CM | POA: Diagnosis not present

## 2015-10-30 LAB — LIPID PANEL
Cholesterol: 148 mg/dL (ref 0–200)
HDL: 60.5 mg/dL (ref >39.00)
LDL Cholesterol: 74 mg/dL (ref 0–99)
NonHDL: 87.11
Total CHOL/HDL Ratio: 2
Triglycerides: 68 mg/dL (ref 0.0–149.0)
VLDL: 13.6 mg/dL (ref 0.0–40.0)

## 2015-10-30 LAB — ALT: ALT: 12 U/L (ref 0–35)

## 2015-10-30 LAB — AST: AST: 13 U/L (ref 0–37)

## 2015-11-03 MED ORDER — ROSUVASTATIN CALCIUM 5 MG PO TABS: 5.0000 mg | ORAL_TABLET | Freq: Every day | ORAL | 3 refills | Status: DC

## 2015-11-07 ENCOUNTER — Ambulatory Visit (INDEPENDENT_AMBULATORY_CARE_PROVIDER_SITE_OTHER): Payer: Medicare Other | Admitting: Psychology

## 2015-11-07 DIAGNOSIS — F332 Major depressive disorder, recurrent severe without psychotic features: Secondary | ICD-10-CM | POA: Diagnosis not present

## 2015-11-13 ENCOUNTER — Other Ambulatory Visit: Payer: Self-pay | Admitting: Internal Medicine

## 2015-11-21 ENCOUNTER — Ambulatory Visit (INDEPENDENT_AMBULATORY_CARE_PROVIDER_SITE_OTHER): Payer: Medicare Other | Admitting: Psychology

## 2015-11-21 DIAGNOSIS — F332 Major depressive disorder, recurrent severe without psychotic features: Secondary | ICD-10-CM | POA: Diagnosis not present

## 2015-12-05 ENCOUNTER — Ambulatory Visit (INDEPENDENT_AMBULATORY_CARE_PROVIDER_SITE_OTHER): Payer: Medicare Other | Admitting: Psychology

## 2015-12-05 DIAGNOSIS — F332 Major depressive disorder, recurrent severe without psychotic features: Secondary | ICD-10-CM | POA: Diagnosis not present

## 2015-12-06 ENCOUNTER — Other Ambulatory Visit: Payer: Self-pay | Admitting: Adult Health

## 2015-12-16 ENCOUNTER — Ambulatory Visit (INDEPENDENT_AMBULATORY_CARE_PROVIDER_SITE_OTHER): Payer: Medicare Other | Admitting: Internal Medicine

## 2015-12-16 ENCOUNTER — Encounter: Payer: Self-pay | Admitting: Internal Medicine

## 2015-12-16 DIAGNOSIS — E785 Hyperlipidemia, unspecified: Secondary | ICD-10-CM

## 2015-12-16 DIAGNOSIS — M79661 Pain in right lower leg: Secondary | ICD-10-CM

## 2015-12-16 DIAGNOSIS — IMO0001 Reserved for inherently not codable concepts without codable children: Secondary | ICD-10-CM

## 2015-12-16 DIAGNOSIS — E538 Deficiency of other specified B group vitamins: Secondary | ICD-10-CM | POA: Diagnosis not present

## 2015-12-16 DIAGNOSIS — M79662 Pain in left lower leg: Secondary | ICD-10-CM

## 2015-12-16 MED ORDER — EZETIMIBE 10 MG PO TABS: 10.0000 mg | ORAL_TABLET | Freq: Every day | ORAL | 12 refills | Status: DC

## 2015-12-16 MED ORDER — CYANOCOBALAMIN 1000 MCG/ML IJ SOLN
1000.0000 ug | Freq: Once | INTRAMUSCULAR | Status: AC
  Administered 2015-12-16: 1000 ug via INTRAMUSCULAR

## 2015-12-16 NOTE — Progress Notes (Signed)
Pre visit review using our clinic review tool, if applicable. No additional management support is needed unless otherwise documented below in the visit note. 

## 2015-12-16 NOTE — Assessment & Plan Note (Addendum)
Hyperlipidemia: We did a trial with Crestor, FLP came back excellent however after initial good tolerance she is now developing pain again pain  in the upper extremities. Plan: Stop crestor, start Zetia. Pain, lower extremities: No evidence of phlebitis, no swelling, a very small ecchymoses of the right popliteal area without mass. Recommend observation. Obesity: BMI 40.0. She has good intentions but is taking the wrong approach to loosing weight such as a skipping meals etc. She is counseled. Refer to a nutritionist. We also talk about possibly Saxenda, cost may be an issue. She will think about it. Encouraged to stay active. B12 deficiency: Due for a shot  RTC 6 months

## 2015-12-16 NOTE — Patient Instructions (Signed)
Stop Crestor  Start taking Zetia once a day  We are referring you to a nutritionist  Think about a medication to weight loss called SAXENDA  Try to stay active daily  Next visit in 6 months

## 2015-12-16 NOTE — Progress Notes (Signed)
Subjective:    Patient ID: [name], female    DOB: 26-Nov-1945, 70 y.o.   MRN: [id number]  DOS:  12/16/2015 Type of visit - description : Routine office visit Interval history: High cholesterol: Took Crestor, initially did very well, now reports that the upper extremity pain has resurface, also having pain in the legs mostly at the popliteal area, knots?. Obesity: States he eats very little and skip meals and still cannot lose weight. Has not been exercising lately in part because MSK issues. Has not seen psychiatry as recommended for depression, denies any suicidal ideas  Review of Systems   Past Medical History:  Diagnosis Date  . Anemia   . Anxiety   . Barrett's esophagus   . Bilateral carpal tunnel syndrome    Dr. Eddie Dibbles, having injection therapy  . C. difficile diarrhea 08/01/2012   severe 2014  . Chronic cystitis    Dr. Matilde Sprang  . Chronic lower back pain   . Depression   . DJD (degenerative joint disease)    bilateral hands; knees  . Family history of anesthesia complication    Mother had severe N/V  . GERD    GERD and HH  . GERD (gastroesophageal reflux disease)   . H/O cardiac catheterization    (-) cath 12-2002  , cath again 2011 (-)  . HTN (hypertension)   . Hyperlipidemia   . Insomnia   . Migraine   . Osteoporosis    pt unsure of this  . RLS (restless legs syndrome)   . Vitamin B 12 deficiency 04/09/2013    Past Surgical History:  Procedure Laterality Date  . Arm surgery Left    "don't remember what they did; arm wasn't broken"  . BILATERAL KNEE ARTHROSCOPY Bilateral   . CARDIAC CATHETERIZATION  01/22/10   clean cath  . CATARACT EXTRACTION W/ INTRAOCULAR LENS  IMPLANT, BILATERAL Bilateral   . COLONOSCOPY W/ POLYPECTOMY    . FOOT SURGERY Bilateral    toenails removed; callus removed on right; hammertoes right"  . Knot     "removed from right neck; not a goiter"  . LUMBAR DISC SURGERY     L5 S1 anterior fusion  . OOPHORECTOMY    . SHOULDER  ARTHROSCOPY Right   . TOTAL KNEE ARTHROPLASTY  10/05/2011   Procedure: TOTAL KNEE ARTHROPLASTY;  Surgeon: Hessie Dibble, MD;  Location: [place];  Service: Orthopedics;  Laterality: Right;  [name] VAGINAL HYSTERECTOMY     for endometriosis    Social History   Social History  . Marital status: Divorced    Spouse name: N/A  . Number of children: 1  . Years of education: N/A   Occupational History  . Retired, post office  Retired   Social History Main Topics  . Smoking status: Former Smoker    Packs/day: 0.50    Years: 10.00    Quit date: 02/09/1975  . Smokeless tobacco: Never Used     Comment: started at age 37.   quit in the 1970s.  . Alcohol use 0.0 oz/week     Comment: 10/05/2011 "socially;  wine or mixed drink at the most once/month"  . Drug use: No  . Sexual activity: Yes   Other Topics Concern  . Not on file   Social History Narrative   Son lives w/ her         Medication List       Accurate as of 12/16/15  1:28 PM. Always use your most recent  med list.          aspirin EC 81 MG tablet Take 81 mg by mouth daily.   benazepril 20 MG tablet Commonly known as:  LOTENSIN Take 1 tablet (20 mg total) by mouth daily.   CALCIUM PLUS VITAMIN D PO Take by mouth daily.   clonazePAM 0.5 MG tablet Commonly known as:  KLONOPIN Take 1 tablet (0.5 mg total) by mouth 3 (three) times daily as needed for anxiety.   HYDROcodone-acetaminophen 10-325 MG tablet Commonly known as:  NORCO Take 1 tablet by mouth every 4 (four) hours as needed. For back pain   MYRBETRIQ 25 MG Tb24 tablet Generic drug:  mirabegron ER Take 25 mg by mouth daily.   omeprazole 40 MG capsule Commonly known as:  PRILOSEC TAKE 1 CAPSULE (40 MG TOTAL) BY MOUTH 2 (TWO) TIMES DAILY.   ranitidine 300 MG tablet Commonly known as:  ZANTAC TAKE 1 TABLET (300 MG TOTAL) BY MOUTH AT BEDTIME.   rOPINIRole 1 MG tablet Commonly known as:  REQUIP TAKE 1 TABLET (1 MG TOTAL) BY MOUTH 2 (TWO) TIMES DAILY.     rosuvastatin 5 MG tablet Commonly known as:  CRESTOR Take 1 tablet (5 mg total) by mouth at bedtime.   topiramate 200 MG tablet Commonly known as:  TOPAMAX Take 400 mg by mouth 2 (two) times daily. Take 1.5 by mouth at bedtime   traZODone 50 MG tablet Commonly known as:  DESYREL Take 1-2 tablets (50-100 mg total) by mouth at bedtime.   venlafaxine XR 75 MG 24 hr capsule Commonly known as:  EFFEXOR-XR Take 3 capsules (225 mg total) by mouth daily with breakfast.          Objective:   Physical Exam BP 124/76 (BP Location: Left Arm, Patient Position: Sitting, Cuff Size: Normal)   Pulse 86   Temp 98.1 F (36.7 C) (Oral)   Resp 14   Ht 5\' 6"  (1.676 m)   Wt 248 lb 4 oz (112.6 kg)   SpO2 98%   BMI 40.07 kg/m  General:   Well developed, well nourished . NAD.  HEENT:  Normocephalic . Face symmetric, atraumatic Lungs:  CTA B Normal respiratory effort, no intercostal retractions, no accessory muscle use. Heart: RRR,  no murmur.  No pretibial edema bilaterally  Lower extremities: Calves nontender to palpation, no phlebitis, at the popliteal area as she is a slightly TTP, at the right popliteal side there is small superficial ecchymoses 23 cm in size. Skin: Not pale. Not jaundice Neurologic:  alert & oriented X3.  Speech normal, gait appropriate for age and unassisted Psych--  Cognition and judgment appear intact.  Cooperative with normal attention span and concentration.  Behavior appropriate. No anxious or depressed appearing.      Assessment & Plan:  Assessment HTN Hyperlipidemia Depression: x many  years, remotely on zoloft and other meds per psych; I rx lexapro 2015, then switch to effexor ; anxiety (clonazepam),  Insomnia (trazodone) Morbid obesity GI:  --GERD, Barrett's esophagus, EGD 05-2015 --Persistent C. difficile diarrhea 2014 B12 deficiency Osteopenia - Bone density test 09-2013 showed osteopenia, on calcium, vitamin D RLS - Requip, see  Pulmonary Migraines, f/u [facility] -- on topamax MSK: ---- pain meds rx by ortho, Dr Eddie Dibbles retired thus pcp will rx if needed as off 03-1015, takes < than 2-3 qd  ----DJD ----Carpal tunnel syndrome bilaterally ----Chronic low back pain OAB, LUTS-- on myrbetriq Dr McDarmoth  H/o Cardiac catheterization 2011 and 2014 negative  PLAN  Hyperlipidemia: We did a trial with Crestor, FLP came back excellent however after initial good tolerance she is now developing pain again pain  in the upper extremities. Plan: Stop crestor, start Zetia. Pain, lower extremities: No evidence of phlebitis, no swelling, a very small ecchymoses of the right popliteal area without mass. Recommend observation. Obesity: BMI 40.0. She has good intentions but is taking the wrong approach to loosing weight such as a skipping meals etc. She is counseled. Refer to a nutritionist. We also talk about possibly Saxenda, cost may be an issue. She will think about it. Encouraged to stay active.   B12 deficiency: Due for a shot  RTC 6 months  Today, I spent more than   25 min with the patient: >50% of the time counseling regards obesity, diet, possible use of medications, exercise.

## 2015-12-18 ENCOUNTER — Other Ambulatory Visit: Payer: Self-pay

## 2015-12-18 MED ORDER — OMEPRAZOLE 40 MG PO CPDR: DELAYED_RELEASE_CAPSULE | ORAL | 0 refills | Status: DC

## 2015-12-19 ENCOUNTER — Ambulatory Visit (INDEPENDENT_AMBULATORY_CARE_PROVIDER_SITE_OTHER): Payer: Medicare Other | Admitting: Psychology

## 2015-12-19 DIAGNOSIS — F332 Major depressive disorder, recurrent severe without psychotic features: Secondary | ICD-10-CM

## 2015-12-29 ENCOUNTER — Ambulatory Visit (INDEPENDENT_AMBULATORY_CARE_PROVIDER_SITE_OTHER): Payer: Medicare Other | Admitting: Psychology

## 2015-12-29 DIAGNOSIS — F331 Major depressive disorder, recurrent, moderate: Secondary | ICD-10-CM

## 2016-01-14 ENCOUNTER — Ambulatory Visit: Payer: Self-pay

## 2016-01-16 ENCOUNTER — Ambulatory Visit (INDEPENDENT_AMBULATORY_CARE_PROVIDER_SITE_OTHER): Payer: Medicare Other | Admitting: Behavioral Health

## 2016-01-16 ENCOUNTER — Ambulatory Visit (INDEPENDENT_AMBULATORY_CARE_PROVIDER_SITE_OTHER): Payer: Medicare Other | Admitting: Psychology

## 2016-01-16 DIAGNOSIS — F332 Major depressive disorder, recurrent severe without psychotic features: Secondary | ICD-10-CM | POA: Diagnosis not present

## 2016-01-16 DIAGNOSIS — E538 Deficiency of other specified B group vitamins: Secondary | ICD-10-CM | POA: Diagnosis not present

## 2016-01-16 MED ORDER — CYANOCOBALAMIN 1000 MCG/ML IJ SOLN
1000.0000 ug | Freq: Once | INTRAMUSCULAR | Status: AC
  Administered 2016-01-16: 1000 ug via INTRAMUSCULAR

## 2016-01-16 NOTE — Progress Notes (Addendum)
Pre visit review using our clinic review tool, if applicable. No additional management support is needed unless otherwise documented below in the visit note.  Patient in clinic today for B12 injection. IM given in Right Deltoid. Patient tolerated injection well.  Next appointment scheduled for 02/17/16 at 2:30 PM.  Kathlene November, MD

## 2016-01-28 ENCOUNTER — Telehealth: Payer: Self-pay | Admitting: *Deleted

## 2016-01-28 NOTE — Telephone Encounter (Signed)
Called patient and left message to return call and schedule AWV w/ [facility].

## 2016-01-29 DIAGNOSIS — N3020 Other chronic cystitis without hematuria: Secondary | ICD-10-CM | POA: Diagnosis not present

## 2016-01-30 ENCOUNTER — Ambulatory Visit (INDEPENDENT_AMBULATORY_CARE_PROVIDER_SITE_OTHER): Payer: Medicare Other | Admitting: Internal Medicine

## 2016-01-30 ENCOUNTER — Encounter: Payer: Self-pay | Admitting: Internal Medicine

## 2016-01-30 ENCOUNTER — Ambulatory Visit (INDEPENDENT_AMBULATORY_CARE_PROVIDER_SITE_OTHER): Payer: Medicare Other | Admitting: Psychology

## 2016-01-30 ENCOUNTER — Ambulatory Visit (HOSPITAL_BASED_OUTPATIENT_CLINIC_OR_DEPARTMENT_OTHER)
Admission: RE | Admit: 2016-01-30 | Discharge: 2016-01-30 | Disposition: A | Discharge status: Home / Self Care | Payer: Medicare Other | Source: Ambulatory Visit | Attending: Internal Medicine | Admitting: Internal Medicine

## 2016-01-30 VITALS — BP 126/78 | HR 108 | Temp 97.8°F | Resp 12 | Ht 66.0 in | Wt 248.5 lb

## 2016-01-30 DIAGNOSIS — M25569 Pain in unspecified knee: Secondary | ICD-10-CM | POA: Diagnosis not present

## 2016-01-30 DIAGNOSIS — R0602 Shortness of breath: Secondary | ICD-10-CM

## 2016-01-30 DIAGNOSIS — M7989 Other specified soft tissue disorders: Secondary | ICD-10-CM | POA: Diagnosis not present

## 2016-01-30 DIAGNOSIS — F332 Major depressive disorder, recurrent severe without psychotic features: Secondary | ICD-10-CM | POA: Diagnosis not present

## 2016-01-30 NOTE — Progress Notes (Signed)
Subjective:    Patient ID: [name], female    DOB: May 14, 1945, 70 y.o.   MRN: [id number]  DOS:  01/30/2016 Type of visit - description : Acute visit, here due to left knee pain but also complaining of shortness of breath. Interval history:  2 weeks history of left knee pain and swelling. The pain is at the front of the knee but  more noticeable at the back of the knee. Denies any injury, redness. Pain is mild-moderate and steady, worse with walking. Sometimes she feels unable to flex the knee after having it fully extended for a while.  Also, for 2 or 3 weeks she feels short of breath, at rest or when she walks, episodes are brief (1-2 minutes, few a day) like she "needs to take a deep breath", she thinks is related to anxiety because usually happens in times of high stress, sometimes associated with shaking but not associated with chest pain, palpitations, nausea or diaphoresis.    Review of Systems Denies any recent airplane trips or car trips. No cough or wheezing. No blood in the stools  Past Medical History:  Diagnosis Date  . Anemia   . Anxiety   . Barrett's esophagus   . Bilateral carpal tunnel syndrome    Dr. Eddie Dibbles, having injection therapy  . C. difficile diarrhea 08/01/2012   severe 2014  . Chronic cystitis    Dr. Matilde Sprang  . Chronic lower back pain   . Depression   . DJD (degenerative joint disease)    bilateral hands; knees  . Family history of anesthesia complication    Mother had severe N/V  . GERD    GERD and HH  . GERD (gastroesophageal reflux disease)   . H/O cardiac catheterization    (-) cath 12-2002  , cath again 2011 (-)  . HTN (hypertension)   . Hyperlipidemia   . Insomnia   . Migraine   . Osteoporosis    pt unsure of this  . RLS (restless legs syndrome)   . Vitamin B 12 deficiency 04/09/2013    Past Surgical History:  Procedure Laterality Date  . Arm surgery Left    "don't remember what they did; arm wasn't broken"  . BILATERAL  KNEE ARTHROSCOPY Bilateral   . CARDIAC CATHETERIZATION  01/22/10   clean cath  . CATARACT EXTRACTION W/ INTRAOCULAR LENS  IMPLANT, BILATERAL Bilateral   . COLONOSCOPY W/ POLYPECTOMY    . FOOT SURGERY Bilateral    toenails removed; callus removed on right; hammertoes right"  . Knot     "removed from right neck; not a goiter"  . LUMBAR DISC SURGERY     L5 S1 anterior fusion  . OOPHORECTOMY    . SHOULDER ARTHROSCOPY Right   . TOTAL KNEE ARTHROPLASTY  10/05/2011   Procedure: TOTAL KNEE ARTHROPLASTY;  Surgeon: Hessie Dibble, MD;  Location: [place];  Service: Orthopedics;  Laterality: Right;  [name] VAGINAL HYSTERECTOMY     for endometriosis    Social History   Social History  . Marital status: Divorced    Spouse name: N/A  . Number of children: 1  . Years of education: N/A   Occupational History  . Retired, post office  Retired   Social History Main Topics  . Smoking status: Former Smoker    Packs/day: 0.50    Years: 10.00    Quit date: 02/09/1975  . Smokeless tobacco: Never Used     Comment: started at age 61.   quit  in the 1970s.  . Alcohol use 0.0 oz/week     Comment: 10/05/2011 "socially;  wine or mixed drink at the most once/month"  . Drug use: No  . Sexual activity: Yes   Other Topics Concern  . Not on file   Social History Narrative   Son lives w/ her       Allergies as of 01/30/2016      Reactions   Dextromethorphan-guaifenesin Nausea And Vomiting   Morphine Nausea And Vomiting   Penicillins Rash   "> 30 years ago; best I can remember it was just a light rash on my arm"      Medication List       Accurate as of 01/30/16 11:59 PM. Always use your most recent med list.          aspirin EC 81 MG tablet Take 81 mg by mouth daily.   benazepril 20 MG tablet Commonly known as:  LOTENSIN Take 1 tablet (20 mg total) by mouth daily.   CALCIUM PLUS VITAMIN D PO Take by mouth daily.   clonazePAM 0.5 MG tablet Commonly known as:  KLONOPIN Take 1 tablet (0.5  mg total) by mouth 3 (three) times daily as needed for anxiety.   ezetimibe 10 MG tablet Commonly known as:  ZETIA Take 1 tablet (10 mg total) by mouth daily.   HYDROcodone-acetaminophen 10-325 MG tablet Commonly known as:  NORCO Take 1 tablet by mouth every 4 (four) hours as needed. For back pain   MYRBETRIQ 25 MG Tb24 tablet Generic drug:  mirabegron ER Take 25 mg by mouth daily.   omeprazole 40 MG capsule Commonly known as:  PRILOSEC TAKE 1 CAPSULE (40 MG TOTAL) BY MOUTH 2 (TWO) TIMES DAILY.   ranitidine 300 MG tablet Commonly known as:  ZANTAC TAKE 1 TABLET (300 MG TOTAL) BY MOUTH AT BEDTIME.   rOPINIRole 1 MG tablet Commonly known as:  REQUIP TAKE 1 TABLET (1 MG TOTAL) BY MOUTH 2 (TWO) TIMES DAILY.   topiramate 200 MG tablet Commonly known as:  TOPAMAX Take 400 mg by mouth 2 (two) times daily. Take 1.5 by mouth at bedtime   traZODone 50 MG tablet Commonly known as:  DESYREL Take 1-2 tablets (50-100 mg total) by mouth at bedtime.   venlafaxine XR 75 MG 24 hr capsule Commonly known as:  EFFEXOR-XR Take 3 capsules (225 mg total) by mouth daily with breakfast.          Objective:   Physical Exam BP 126/78 (BP Location: Left Arm, Patient Position: Sitting, Cuff Size: Normal)   Pulse (!) 108   Temp 97.8 F (36.6 C) (Oral)   Resp 12   Ht 5\' 6"  (1.676 m)   Wt 248 lb 8 oz (112.7 kg)   SpO2 97%   BMI 40.11 kg/m  General:   Well developed, well nourished . NAD.  HEENT:  Normocephalic . Face symmetric, atraumatic Lungs:  CTA B Normal respiratory effort, no intercostal retractions, no accessory muscle use. Heart: Slightly tachycardic,  no murmur.  No pretibial edema bilaterally  Calves are symmetric in size but left one is TTP. See below MSK: --Right knee: Status post surgery. No redness or swelling --Left knee: No obvious effusion, redness, + deformities consistent with DJD. Passive and active range of motion cause some pain. Posterior calf, proximaly is a  slightly TTP. No redness or warmness. Skin: Not pale. Not jaundice Neurologic:  alert & oriented X3.  Speech normal, gait appropriate for age and unassisted  Psych--  Cognition and judgment appear intact.  Cooperative with normal attention span and concentration.  Behavior appropriate. No anxious or depressed appearing.      Assessment & Plan:   Assessment HTN Hyperlipidemia Depression: x many  years, remotely on zoloft and other meds per psych; I rx lexapro 2015, then switch to effexor ; anxiety (on clonazepam),  Insomnia (on trazodone) Morbid obesity GI:  --GERD, Barrett's esophagus, EGD 05-2015 --h/o persistent C. difficile diarrhea 2014 B12 deficiency Osteopenia - Bone density test 09-2013 showed osteopenia, on calcium, vitamin D RLS - Requip, see Pulmonary Migraines, f/u [facility] -- on topamax MSK: ---- pain meds rx by ortho, Dr Eddie Dibbles retired thus pcp will rx if needed as off 03-1015, takes < than 2-3 qd  ----DJD ----Carpal tunnel syndrome bilaterally ----Chronic low back pain OAB, LUTS-- on myrbetriq Dr McDarmoth  H/o Cardiac catheterization 2011 and 2014 negative  PLAN Knee pain: Likely DJD, slightly tender at the proximal calf, must rule out DVT but DDX includes a Baker's cyst. Will get a stat ultrasound. Otherwise refer to orthopedic. Shortness of breath: As described above, she was a slightly tachycardic upon arrival to the office, EKG today showed sinus rhythm, rate 90, RBBB which is not new. Will reassess in 2 weeks. Could be anxiety related. ER if severe-different sx RTC 2 weeks

## 2016-01-30 NOTE — Patient Instructions (Signed)
Please go downstairs and get the ultrasound of your left leg now. We need to rule out a clot.  If you have increased difficulty breathing or have associated  chest pain: Go to the ER  Come back in 2 weeks for recheck.

## 2016-01-30 NOTE — Progress Notes (Signed)
Pre visit review using our clinic review tool, if applicable. No additional management support is needed unless otherwise documented below in the visit note. 

## 2016-02-01 NOTE — Assessment & Plan Note (Signed)
Knee pain: Likely DJD, slightly tender at the proximal calf, must rule out DVT but DDX includes a Baker's cyst. Will get a stat ultrasound. Otherwise refer to orthopedic. Shortness of breath: As described above, she was a slightly tachycardic upon arrival to the office, EKG today showed sinus rhythm, rate 90, RBBB which is not new. Will reassess in 2 weeks. Could be anxiety related. ER if severe-different sx RTC 2 weeks

## 2016-02-09 ENCOUNTER — Other Ambulatory Visit: Payer: Self-pay | Admitting: Gastroenterology

## 2016-02-10 ENCOUNTER — Telehealth: Payer: Self-pay | Admitting: Gastroenterology

## 2016-02-10 DIAGNOSIS — M25561 Pain in right knee: Secondary | ICD-10-CM | POA: Diagnosis not present

## 2016-02-10 DIAGNOSIS — M25562 Pain in left knee: Secondary | ICD-10-CM | POA: Diagnosis not present

## 2016-02-10 NOTE — Telephone Encounter (Signed)
Informed patient that I got a refill request from the pharmacy this morning and refilled her prescription for ranitidine. Also informed patient that she is due for follow up in April. Patient verbalized understanding.

## 2016-02-13 ENCOUNTER — Encounter: Payer: Self-pay | Admitting: Internal Medicine

## 2016-02-13 ENCOUNTER — Ambulatory Visit (INDEPENDENT_AMBULATORY_CARE_PROVIDER_SITE_OTHER): Payer: Medicare Other | Admitting: Internal Medicine

## 2016-02-13 VITALS — BP 124/78 | HR 74 | Temp 98.0°F | Resp 14 | Ht 66.0 in | Wt 252.4 lb

## 2016-02-13 DIAGNOSIS — E538 Deficiency of other specified B group vitamins: Secondary | ICD-10-CM | POA: Diagnosis not present

## 2016-02-13 DIAGNOSIS — M150 Primary generalized (osteo)arthritis: Secondary | ICD-10-CM | POA: Diagnosis not present

## 2016-02-13 DIAGNOSIS — R0602 Shortness of breath: Secondary | ICD-10-CM

## 2016-02-13 DIAGNOSIS — M159 Polyosteoarthritis, unspecified: Secondary | ICD-10-CM

## 2016-02-13 DIAGNOSIS — M8949 Other hypertrophic osteoarthropathy, multiple sites: Secondary | ICD-10-CM

## 2016-02-13 MED ORDER — CYANOCOBALAMIN 1000 MCG/ML IJ SOLN
1000.0000 ug | Freq: Once | INTRAMUSCULAR | Status: AC
  Administered 2016-02-13: 1000 ug via INTRAMUSCULAR

## 2016-02-13 NOTE — Progress Notes (Signed)
Subjective:    Patient ID: [name], female    DOB: Apr 13, 1945, 71 y.o.   MRN: [id number]  DOS:  02/13/2016 Type of visit - description : f/u previous visit Interval history: since last visit, a ultrasound of the leg show no DVT. She saw orthopedics, had B knee shots, feeling better on that regard. Anxiety: About the same Shortness of breath: Symptoms are the same as before but less frequent and intense. Diet-- was unable to see a nutritionist  Review of Systems  Denies chest pain, cough or wheezing.  Past Medical History:  Diagnosis Date  . Anemia   . Anxiety   . Barrett's esophagus   . Bilateral carpal tunnel syndrome    Dr. Eddie Dibbles, having injection therapy  . C. difficile diarrhea 08/01/2012   severe 2014  . Chronic cystitis    Dr. Matilde Sprang  . Chronic lower back pain   . Depression   . DJD (degenerative joint disease)    bilateral hands; knees  . Family history of anesthesia complication    Mother had severe N/V  . GERD (gastroesophageal reflux disease)   . H/O cardiac catheterization    (-) cath 12-2002  , cath again 2011 (-)  . HTN (hypertension)   . Hyperlipidemia   . Insomnia   . Migraine   . Osteoporosis    pt unsure of this  . RLS (restless legs syndrome)   . Vitamin B 12 deficiency 04/09/2013    Past Surgical History:  Procedure Laterality Date  . Arm surgery Left    "don't remember what they did; arm wasn't broken"  . BILATERAL KNEE ARTHROSCOPY Bilateral   . CARDIAC CATHETERIZATION  01/22/10   clean cath  . CATARACT EXTRACTION W/ INTRAOCULAR LENS  IMPLANT, BILATERAL Bilateral   . COLONOSCOPY W/ POLYPECTOMY    . FOOT SURGERY Bilateral    toenails removed; callus removed on right; hammertoes right"  . Knot     "removed from right neck; not a goiter"  . LUMBAR DISC SURGERY     L5 S1 anterior fusion  . OOPHORECTOMY    . SHOULDER ARTHROSCOPY Right   . TOTAL KNEE ARTHROPLASTY  10/05/2011   Procedure: TOTAL KNEE ARTHROPLASTY;  Surgeon: Hessie Dibble, MD;  Location: [place];  Service: Orthopedics;  Laterality: Right;  [name] VAGINAL HYSTERECTOMY     for endometriosis    Social History   Social History  . Marital status: Divorced    Spouse name: N/A  . Number of children: 1  . Years of education: N/A   Occupational History  . Retired, post office  Retired   Social History Main Topics  . Smoking status: Former Smoker    Packs/day: 0.50    Years: 10.00    Quit date: 02/09/1975  . Smokeless tobacco: Never Used     Comment: started at age 49.   quit in the 1970s.  . Alcohol use 0.0 oz/week     Comment: 10/05/2011 "socially;  wine or mixed drink at the most once/month"  . Drug use: No  . Sexual activity: Yes   Other Topics Concern  . Not on file   Social History Narrative   Son lives w/ her       Allergies as of 02/13/2016      Reactions   Dextromethorphan-guaifenesin Nausea And Vomiting   Morphine Nausea And Vomiting   Penicillins Rash   "> 30 years ago; best I can remember it was just a light rash  on my arm"      Medication List       Accurate as of 02/13/16 11:59 PM. Always use your most recent med list.          aspirin EC 81 MG tablet Take 81 mg by mouth daily.   benazepril 20 MG tablet Commonly known as:  LOTENSIN Take 1 tablet (20 mg total) by mouth daily.   CALCIUM PLUS VITAMIN D PO Take by mouth daily.   clonazePAM 0.5 MG tablet Commonly known as:  KLONOPIN Take 1 tablet (0.5 mg total) by mouth 3 (three) times daily as needed for anxiety.   ezetimibe 10 MG tablet Commonly known as:  ZETIA Take 1 tablet (10 mg total) by mouth daily.   HYDROcodone-acetaminophen 10-325 MG tablet Commonly known as:  NORCO Take 1 tablet by mouth every 4 (four) hours as needed. For back pain   MYRBETRIQ 25 MG Tb24 tablet Generic drug:  mirabegron ER Take 25 mg by mouth daily.   omeprazole 40 MG capsule Commonly known as:  PRILOSEC TAKE 1 CAPSULE (40 MG TOTAL) BY MOUTH 2 (TWO) TIMES DAILY.   ranitidine 300  MG tablet Commonly known as:  ZANTAC TAKE 1 TABLET (300 MG TOTAL) BY MOUTH AT BEDTIME.   rOPINIRole 1 MG tablet Commonly known as:  REQUIP TAKE 1 TABLET (1 MG TOTAL) BY MOUTH 2 (TWO) TIMES DAILY.   topiramate 200 MG tablet Commonly known as:  TOPAMAX Take 400 mg by mouth 2 (two) times daily. Take 1.5 by mouth at bedtime   traZODone 50 MG tablet Commonly known as:  DESYREL Take 1-2 tablets (50-100 mg total) by mouth at bedtime.   venlafaxine XR 75 MG 24 hr capsule Commonly known as:  EFFEXOR-XR Take 3 capsules (225 mg total) by mouth daily with breakfast.          Objective:   Physical Exam BP 124/78 (BP Location: Left Arm, Patient Position: Sitting, Cuff Size: Normal)   Pulse 74   Temp 98 F (36.7 C) (Oral)   Resp 14   Ht 5\' 6"  (1.676 m)   Wt 252 lb 6 oz (114.5 kg)   SpO2 96%   BMI 40.73 kg/m  General:   Well developed, well nourished . NAD.  HEENT:  Normocephalic . Face symmetric, atraumatic Lungs:  CTA B Normal respiratory effort, no intercostal retractions, no accessory muscle use. Heart: RRR,  no murmur.  No pretibial edema bilaterally  Skin: Not pale. Not jaundice Neurologic:  alert & oriented X3.  Speech normal, gait appropriate for age and unassisted Psych--  Cognition and judgment appear intact.  Cooperative with normal attention span and concentration.  Behavior appropriate. No anxious or depressed appearing.      Assessment & Plan:   Assessment HTN Hyperlipidemia Depression, Anxiety (on clonazepam), insomnia (on trazodone): depression x many  years, remotely on zoloft and other meds per psych, I rx lexapro 2015, then switch to effexor ; Morbid obesity GI:  --GERD, Barrett's esophagus, EGD 05-2015 --h/o persistent C. difficile diarrhea 2014 B12 deficiency Osteopenia - Bone density test 09-2013 showed osteopenia, on calcium, vitamin D RLS - Requip, see Pulmonary Migraines, f/u [facility] -- on topamax MSK: ---- pain meds rx by ortho, Dr  Eddie Dibbles retired thus pcp will rx if needed as off 03-1015, takes < than 2-3 qd  ----DJD ----Carpal tunnel syndrome bilaterally ----Chronic low back pain OAB, LUTS-- on myrbetriq Dr McDarmoth  H/o Cardiac catheterization 2011 and 2014 negative  PLAN Knee pain: [place]  was nef for DVT, saw ortho, B knees injected, feeling great. SOB: sx decreased. Rec observation Anxiety depression insomnia: Few months ago was referred to psychiatry, visit is pending . Sees a counselor in our office and that has been helpful. For now continue trazodone, Klonopin, Effexor. UDS today. b 12 def-- shot today Morbid obesity: Has been unable to see a nutritionist due to cost, discussed diet, recommend self education. See AVS RTC 06-2016

## 2016-02-13 NOTE — Progress Notes (Signed)
Pre visit review using our clinic review tool, if applicable. No additional management support is needed unless otherwise documented below in the visit note. 

## 2016-02-13 NOTE — Patient Instructions (Addendum)
Go to the lab and provide a urine sample for a UDS   See you in May 2018   If you need more information about a healthy diet,   visit: The American Heart Association, http://www.heart.org  The American diabetes Association  Http://www.diabetes.org

## 2016-02-15 NOTE — Assessment & Plan Note (Signed)
Knee pain: [place] was nef for DVT, saw ortho, B knees injected, feeling great. SOB: sx decreased. Rec observation Anxiety depression insomnia: Few months ago was referred to psychiatry, visit is pending . Sees a counselor in our office and that has been helpful. For now continue trazodone, Klonopin, Effexor. UDS today. b 12 def-- shot today Morbid obesity: Has been unable to see a nutritionist due to cost, discussed diet, recommend self education. See AVS RTC 06-2016

## 2016-02-16 ENCOUNTER — Encounter: Payer: Self-pay | Admitting: Internal Medicine

## 2016-02-16 DIAGNOSIS — Z79899 Other long term (current) drug therapy: Secondary | ICD-10-CM | POA: Diagnosis not present

## 2016-02-17 ENCOUNTER — Ambulatory Visit: Payer: Self-pay

## 2016-02-20 ENCOUNTER — Ambulatory Visit (INDEPENDENT_AMBULATORY_CARE_PROVIDER_SITE_OTHER): Payer: Medicare Other | Admitting: Psychology

## 2016-02-20 DIAGNOSIS — F332 Major depressive disorder, recurrent severe without psychotic features: Secondary | ICD-10-CM | POA: Diagnosis not present

## 2016-03-01 ENCOUNTER — Telehealth: Payer: Self-pay

## 2016-03-01 NOTE — Telephone Encounter (Signed)
UDS: 02/16/2016  Negative for Clonazepam:PRN Positive for Hydrocodone   Low risk per PCP 02/27/2016

## 2016-03-05 ENCOUNTER — Ambulatory Visit (INDEPENDENT_AMBULATORY_CARE_PROVIDER_SITE_OTHER): Payer: Medicare Other | Admitting: Psychology

## 2016-03-05 DIAGNOSIS — F332 Major depressive disorder, recurrent severe without psychotic features: Secondary | ICD-10-CM

## 2016-03-16 ENCOUNTER — Ambulatory Visit: Payer: Medicare Other

## 2016-03-16 DIAGNOSIS — E538 Deficiency of other specified B group vitamins: Secondary | ICD-10-CM

## 2016-03-16 MED ORDER — CYANOCOBALAMIN 1000 MCG/ML IJ SOLN: 1000.0000 ug | Freq: Once | INTRAMUSCULAR | Status: DC

## 2016-03-16 NOTE — Progress Notes (Signed)
Pre visit review using our clinic tool,if applicable. No additional management support is needed unless otherwise documented below in the visit note.   Patient in for B12 injection per order from Dr. Larose Kells due to patients B12 defiency. Given IM left deltoid. Patient tolerated well.

## 2016-03-19 ENCOUNTER — Ambulatory Visit (INDEPENDENT_AMBULATORY_CARE_PROVIDER_SITE_OTHER): Payer: Medicare Other | Admitting: Psychology

## 2016-03-19 DIAGNOSIS — F332 Major depressive disorder, recurrent severe without psychotic features: Secondary | ICD-10-CM

## 2016-04-02 ENCOUNTER — Ambulatory Visit (INDEPENDENT_AMBULATORY_CARE_PROVIDER_SITE_OTHER): Payer: Medicare Other | Admitting: Psychology

## 2016-04-02 DIAGNOSIS — F332 Major depressive disorder, recurrent severe without psychotic features: Secondary | ICD-10-CM

## 2016-04-05 ENCOUNTER — Other Ambulatory Visit: Payer: Self-pay | Admitting: Gastroenterology

## 2016-04-05 DIAGNOSIS — H524 Presbyopia: Secondary | ICD-10-CM | POA: Diagnosis not present

## 2016-04-05 DIAGNOSIS — H26493 Other secondary cataract, bilateral: Secondary | ICD-10-CM | POA: Diagnosis not present

## 2016-04-05 DIAGNOSIS — H40013 Open angle with borderline findings, low risk, bilateral: Secondary | ICD-10-CM | POA: Diagnosis not present

## 2016-04-13 ENCOUNTER — Ambulatory Visit: Payer: Self-pay

## 2016-04-16 ENCOUNTER — Ambulatory Visit (INDEPENDENT_AMBULATORY_CARE_PROVIDER_SITE_OTHER): Payer: Medicare Other | Admitting: Psychology

## 2016-04-16 ENCOUNTER — Ambulatory Visit: Payer: Medicare Other | Admitting: Psychology

## 2016-04-16 ENCOUNTER — Ambulatory Visit (INDEPENDENT_AMBULATORY_CARE_PROVIDER_SITE_OTHER): Payer: Medicare Other

## 2016-04-16 DIAGNOSIS — E538 Deficiency of other specified B group vitamins: Secondary | ICD-10-CM | POA: Diagnosis not present

## 2016-04-16 DIAGNOSIS — F332 Major depressive disorder, recurrent severe without psychotic features: Secondary | ICD-10-CM | POA: Diagnosis not present

## 2016-04-16 MED ORDER — CYANOCOBALAMIN 1000 MCG/ML IJ SOLN
1000.0000 ug | Freq: Once | INTRAMUSCULAR | Status: AC
  Administered 2016-04-16: 1000 ug via INTRAMUSCULAR

## 2016-04-16 NOTE — Progress Notes (Signed)
Pre visit review using our clinic tool,if applicable. No additional management support is needed unless otherwise documented below in the visit note.  Patient in for B12 injection per order from Dr. Larose Kells. Due to patients diagnosis of B12 deficiency.  Patient voiced no complaints today during visit.   Given 1000 mcg IM right deltoid. Patient tolerated well. Return appointment scheduled.Patient will have injection on her next visit with provider. It will fall on date due.

## 2016-04-19 ENCOUNTER — Telehealth: Payer: Self-pay | Admitting: Internal Medicine

## 2016-04-19 ENCOUNTER — Ambulatory Visit: Payer: Self-pay | Admitting: Internal Medicine

## 2016-04-19 NOTE — Telephone Encounter (Signed)
No Charge-inclement weather.

## 2016-04-19 NOTE — Telephone Encounter (Signed)
VM from pt 3/12 9:16am not going to  Make appt bc it is sleeting, pt will call to reschedule, charge or no charge?

## 2016-04-30 ENCOUNTER — Ambulatory Visit (INDEPENDENT_AMBULATORY_CARE_PROVIDER_SITE_OTHER): Payer: Medicare Other | Admitting: Psychology

## 2016-04-30 DIAGNOSIS — F332 Major depressive disorder, recurrent severe without psychotic features: Secondary | ICD-10-CM | POA: Diagnosis not present

## 2016-05-04 ENCOUNTER — Ambulatory Visit: Payer: Self-pay | Admitting: Pulmonary Disease

## 2016-05-05 ENCOUNTER — Other Ambulatory Visit: Payer: Self-pay | Admitting: Internal Medicine

## 2016-05-07 ENCOUNTER — Other Ambulatory Visit: Payer: Self-pay | Admitting: Gastroenterology

## 2016-05-14 ENCOUNTER — Ambulatory Visit (INDEPENDENT_AMBULATORY_CARE_PROVIDER_SITE_OTHER): Payer: Medicare Other | Admitting: Psychology

## 2016-05-14 ENCOUNTER — Ambulatory Visit (INDEPENDENT_AMBULATORY_CARE_PROVIDER_SITE_OTHER): Payer: Medicare Other

## 2016-05-14 DIAGNOSIS — F332 Major depressive disorder, recurrent severe without psychotic features: Secondary | ICD-10-CM | POA: Diagnosis not present

## 2016-05-14 DIAGNOSIS — E538 Deficiency of other specified B group vitamins: Secondary | ICD-10-CM

## 2016-05-14 MED ORDER — CYANOCOBALAMIN 1000 MCG/ML IJ SOLN
1000.0000 ug | Freq: Once | INTRAMUSCULAR | Status: AC
  Administered 2016-05-14: 1000 ug via INTRAMUSCULAR

## 2016-05-14 NOTE — Progress Notes (Addendum)
Pre visit review using our clinic tool,if applicable. No additional management support is needed unless otherwise documented below in the visit note.   Patient in for B12 injection per order from Dr. French Ana. due to B12 deficiency.   Given 1000 mcg IM left deltoid. Patient tolerated well. No complaints voiced this visit. Patient has already scheduled appointment for next visit in 1 month.  Kathlene November, MD

## 2016-05-28 ENCOUNTER — Ambulatory Visit (INDEPENDENT_AMBULATORY_CARE_PROVIDER_SITE_OTHER): Payer: Medicare Other | Admitting: Psychology

## 2016-05-28 DIAGNOSIS — F332 Major depressive disorder, recurrent severe without psychotic features: Secondary | ICD-10-CM | POA: Diagnosis not present

## 2016-06-03 ENCOUNTER — Other Ambulatory Visit: Payer: Self-pay | Admitting: Adult Health

## 2016-06-04 ENCOUNTER — Other Ambulatory Visit: Payer: Self-pay | Admitting: Internal Medicine

## 2016-06-11 ENCOUNTER — Ambulatory Visit (INDEPENDENT_AMBULATORY_CARE_PROVIDER_SITE_OTHER): Payer: Medicare Other | Admitting: Psychology

## 2016-06-11 DIAGNOSIS — F332 Major depressive disorder, recurrent severe without psychotic features: Secondary | ICD-10-CM | POA: Diagnosis not present

## 2016-06-14 ENCOUNTER — Ambulatory Visit (INDEPENDENT_AMBULATORY_CARE_PROVIDER_SITE_OTHER): Payer: Medicare Other | Admitting: Internal Medicine

## 2016-06-14 ENCOUNTER — Encounter: Payer: Self-pay | Admitting: Internal Medicine

## 2016-06-14 VITALS — BP 132/80 | HR 91 | Temp 97.8°F | Resp 14 | Ht 66.0 in | Wt 254.4 lb

## 2016-06-14 DIAGNOSIS — Z1159 Encounter for screening for other viral diseases: Secondary | ICD-10-CM | POA: Diagnosis not present

## 2016-06-14 DIAGNOSIS — I10 Essential (primary) hypertension: Secondary | ICD-10-CM

## 2016-06-14 DIAGNOSIS — E538 Deficiency of other specified B group vitamins: Secondary | ICD-10-CM | POA: Diagnosis not present

## 2016-06-14 LAB — BASIC METABOLIC PANEL
BUN: 19 mg/dL (ref 6–23)
CO2: 28 mEq/L (ref 19–32)
Calcium: 9.1 mg/dL (ref 8.4–10.5)
Chloride: 103 mEq/L (ref 96–112)
Creatinine, Ser: 0.92 mg/dL (ref 0.40–1.20)
GFR: 63.83 mL/min (ref >60.00)
Glucose, Bld: 94 mg/dL (ref 70–99)
Potassium: 5 mEq/L (ref 3.5–5.1)
Sodium: 138 mEq/L (ref 135–145)

## 2016-06-14 MED ORDER — CYANOCOBALAMIN 1000 MCG/ML IJ SOLN
1000.0000 ug | Freq: Once | INTRAMUSCULAR | Status: AC
  Administered 2016-06-14: 1000 ug via INTRAMUSCULAR

## 2016-06-14 MED ORDER — RANITIDINE HCL 300 MG PO TABS: 300.0000 mg | ORAL_TABLET | Freq: Every day | ORAL | 2 refills | Status: DC

## 2016-06-14 NOTE — Progress Notes (Signed)
Subjective:    Patient ID: [name], female    DOB: [birth year], 71 y.o.   MRN: [id number]  DOS:  06/14/2016 Type of visit - description : rov Interval history: Obesity: This remains her main challenges, unable to lose weight. Unable to exercise, admits there is a lot of room for improvement of her diet. Migraines: Off Topamax, doing okay with OTCs HTN: Good med compliance. High cholesterol: Intolerant to Zetia, took it for a month and developed severe generalized joint aches    Wt Readings from Last 3 Encounters:  06/14/16 254 lb 6 oz (115.4 kg)  02/13/16 252 lb 6 oz (114.5 kg)  01/30/16 248 lb 8 oz (112.7 kg)     Review of Systems Had a fall 8 days ago, mechanical. Denies any major injury, neck or back pain. Having hot flashes? Feels really "hot", in waves, at night but denies fever, chills, headache, nausea, vomiting, diarrhea.  Past Medical History:  Diagnosis Date  . Anemia   . Anxiety   . Barrett's esophagus   . Bilateral carpal tunnel syndrome    Dr. Eddie Dibbles, having injection therapy  . C. difficile diarrhea 08/01/2012   severe 2014  . Chronic cystitis    Dr. Matilde Sprang  . Chronic lower back pain   . Depression   . DJD (degenerative joint disease)    bilateral hands; knees  . Family history of anesthesia complication    Mother had severe N/V  . GERD (gastroesophageal reflux disease)   . H/O cardiac catheterization    (-) cath 12-2002  , cath again 2011 (-)  . HTN (hypertension)   . Hyperlipidemia   . Insomnia   . Migraine   . Osteoporosis    pt unsure of this  . RLS (restless legs syndrome)   . Vitamin B 12 deficiency 04/09/2013    Past Surgical History:  Procedure Laterality Date  . Arm surgery Left    "don't remember what they did; arm wasn't broken"  . BILATERAL KNEE ARTHROSCOPY Bilateral   . CARDIAC CATHETERIZATION  01/22/10   clean cath  . CATARACT EXTRACTION W/ INTRAOCULAR LENS  IMPLANT, BILATERAL Bilateral   . COLONOSCOPY W/ POLYPECTOMY    .  FOOT SURGERY Bilateral    toenails removed; callus removed on right; hammertoes right"  . Knot     "removed from right neck; not a goiter"  . LUMBAR DISC SURGERY     L5 S1 anterior fusion  . OOPHORECTOMY    . SHOULDER ARTHROSCOPY Right   . TOTAL KNEE ARTHROPLASTY  10/05/2011   Procedure: TOTAL KNEE ARTHROPLASTY;  Surgeon: Hessie Dibble, MD;  Location: [name];  Service: Orthopedics;  Laterality: Right;  [name] VAGINAL HYSTERECTOMY     for endometriosis    Social History   Social History  . Marital status: Divorced    Spouse name: N/A  . Number of children: 1  . Years of education: N/A   Occupational History  . Retired, post office  Retired   Social History Main Topics  . Smoking status: Former Smoker    Packs/day: 0.50    Years: 10.00    Quit date: 02/09/1975  . Smokeless tobacco: Never Used     Comment: started at age 38.   quit in the 1970s.  . Alcohol use 0.0 oz/week     Comment: 10/05/2011 "socially;  wine or mixed drink at the most once/month"  . Drug use: No  . Sexual activity: Yes   Other Topics Concern  .  Not on file   Social History Narrative   Son lives w/ her       Allergies as of 06/14/2016      Reactions   Dextromethorphan-guaifenesin Nausea And Vomiting   Morphine Nausea And Vomiting   Penicillins Rash   "> 30 years ago; best I can remember it was just a light rash on my arm"      Medication List       Accurate as of 06/14/16 11:59 PM. Always use your most recent med list.          aspirin EC 81 MG tablet Take 81 mg by mouth daily.   benazepril 20 MG tablet Commonly known as:  LOTENSIN Take 1 tablet (20 mg total) by mouth daily.   CALCIUM PLUS VITAMIN D PO Take by mouth daily.   clonazePAM 0.5 MG tablet Commonly known as:  KLONOPIN Take 1 tablet (0.5 mg total) by mouth 3 (three) times daily as needed for anxiety.   HYDROcodone-acetaminophen 10-325 MG tablet Commonly known as:  NORCO Take 1 tablet by mouth every 4 (four) hours as needed.  For back pain   MYRBETRIQ 25 MG Tb24 tablet Generic drug:  mirabegron ER Take 25 mg by mouth daily.   omeprazole 40 MG capsule Commonly known as:  PRILOSEC TAKE ONE CAPSULE BY MOUTH TWICE A DAY   ranitidine 300 MG tablet Commonly known as:  ZANTAC Take 1 tablet (300 mg total) by mouth at bedtime.   rOPINIRole 1 MG tablet Commonly known as:  REQUIP TAKE 1 TABLET (1 MG TOTAL) BY MOUTH 2 (TWO) TIMES DAILY.   traZODone 50 MG tablet Commonly known as:  DESYREL Take 1-2 tablets (50-100 mg total) by mouth at bedtime.   venlafaxine XR 75 MG 24 hr capsule Commonly known as:  EFFEXOR-XR Take 3 capsules (225 mg total) by mouth daily with breakfast.          Objective:   Physical Exam BP 132/80 (BP Location: Left Arm, Patient Position: Sitting, Cuff Size: Normal)   Pulse 91   Temp 97.8 F (36.6 C) (Oral)   Resp 14   Ht 5\' 6"  (1.676 m)   Wt 254 lb 6 oz (115.4 kg)   SpO2 97%   BMI 41.06 kg/m  General:   Well developed, well nourished . NAD.  HEENT:  Normocephalic . Face symmetric, atraumatic Lungs:  CTA B Normal respiratory effort, no intercostal retractions, no accessory muscle use. Heart: RRR,  no murmur.  no pretibial edema bilaterally  Abdomen:  Not distended, soft, non-tender. No rebound or rigidity.  Skin: Not pale. Not jaundice Neurologic:  alert & oriented X3.  Speech normal, gait appropriate for age and unassisted Psych--  Cognition and judgment appear intact.  Cooperative with normal attention span and concentration.  Behavior appropriate. No anxious or depressed appearing.    Assessment & Plan:   Assessment HTN Hyperlipidemia- Pravachol, Crestor: Myalgias. Intolerant to Zetia (joint aches), see OV  06/2016  Depression, Anxiety (on clonazepam), insomnia (on trazodone): depression x many  years, remotely on zoloft and other meds per psych, I rx lexapro 2015, then switch to effexor ; Morbid obesity GI:  --GERD, Barrett's esophagus, EGD 05-2015 --h/o  persistent C. difficile diarrhea 2014 B12 deficiency Osteopenia - Bone density test 09-2013 showed osteopenia, on calcium, vitamin D RLS - Requip, see Pulmonary Migraines, f/u [facility] -- off topamax as off 06-2016 MSK: --Back pain, chronic: pain meds rx by ortho, Dr Eddie Dibbles retired, rx per Workers comp  MD --DJD ----CTS B OAB, LUTS-- on myrbetriq Dr McDarmoth  H/o Cardiac catheterization 2011 and 2014 negative  PLAN HTN: Seems well-controlled on Lotensin. Check a BMP Hyperlipidemia: Intolerant to statins, now also intolerant to Zetia which is unusual. Reassess on RTC Morbid obesity: Unable to exercise, but room for improvement on diet, recommend to see the bariatric clinic. GERD: Refill Zantac Pain management: meds Rx per workers compensation M.D. Hot flashes? No actual fever chills, no weight loss. Recommend to see gynecology Migraines: Used to be on Topamax, she stopped, doing okay with OTCs as needed RTC 5-6 months CPX

## 2016-06-14 NOTE — Patient Instructions (Signed)
GO TO THE LAB : Get the blood work     GO TO THE FRONT DESK Schedule your next appointment for a routine checkup with me in 5-6 months.  Schedule a Medicare wellness visit with one of our nurses by August 2018  Consider visit the  bariatric clinic , see info provided

## 2016-06-14 NOTE — Progress Notes (Signed)
Pre visit review using our clinic review tool, if applicable. No additional management support is needed unless otherwise documented below in the visit note. 

## 2016-06-15 ENCOUNTER — Ambulatory Visit: Payer: Self-pay

## 2016-06-15 LAB — HEPATITIS C ANTIBODY: HCV Ab: NEGATIVE

## 2016-06-15 NOTE — Assessment & Plan Note (Signed)
HTN: Seems well-controlled on Lotensin. Check a BMP Hyperlipidemia: Intolerant to statins, now also intolerant to Zetia which is unusual. Reassess on RTC Morbid obesity: Unable to exercise, but room for improvement on diet, recommend to see the bariatric clinic. GERD: Refill Zantac Pain management: meds Rx per workers compensation M.D. Hot flashes? No actual fever chills, no weight loss. Recommend to see gynecology Migraines: Used to be on Topamax, she stopped, doing okay with OTCs as needed RTC 5-6 months CPX

## 2016-06-23 ENCOUNTER — Ambulatory Visit: Payer: Self-pay | Admitting: Pulmonary Disease

## 2016-06-27 ENCOUNTER — Other Ambulatory Visit: Payer: Self-pay | Admitting: Internal Medicine

## 2016-07-04 ENCOUNTER — Other Ambulatory Visit: Payer: Self-pay | Admitting: Gastroenterology

## 2016-07-09 ENCOUNTER — Ambulatory Visit (INDEPENDENT_AMBULATORY_CARE_PROVIDER_SITE_OTHER): Payer: Medicare Other | Admitting: Psychology

## 2016-07-09 DIAGNOSIS — F332 Major depressive disorder, recurrent severe without psychotic features: Secondary | ICD-10-CM

## 2016-07-16 ENCOUNTER — Ambulatory Visit (INDEPENDENT_AMBULATORY_CARE_PROVIDER_SITE_OTHER): Payer: Medicare Other | Admitting: Behavioral Health

## 2016-07-16 DIAGNOSIS — E538 Deficiency of other specified B group vitamins: Secondary | ICD-10-CM

## 2016-07-16 MED ORDER — CYANOCOBALAMIN 1000 MCG/ML IJ SOLN
1000.0000 ug | Freq: Once | INTRAMUSCULAR | Status: AC
  Administered 2016-07-16: 1000 ug via INTRAMUSCULAR

## 2016-07-16 NOTE — Progress Notes (Addendum)
Pre visit review using our clinic review tool, if applicable. No additional management support is needed unless otherwise documented below in the visit note.  Patient in clinic for monthly B12 injection. IM injection given in the left deltoid. Patient tolerated it well. Next appointment scheduled for 08/17/16 at 2:30 PM.  Kathlene November, MD

## 2016-07-19 ENCOUNTER — Other Ambulatory Visit: Payer: Self-pay | Admitting: Gastroenterology

## 2016-07-23 ENCOUNTER — Ambulatory Visit (INDEPENDENT_AMBULATORY_CARE_PROVIDER_SITE_OTHER): Payer: Medicare Other | Admitting: Psychology

## 2016-07-23 DIAGNOSIS — F332 Major depressive disorder, recurrent severe without psychotic features: Secondary | ICD-10-CM

## 2016-07-29 DIAGNOSIS — N3941 Urge incontinence: Secondary | ICD-10-CM | POA: Diagnosis not present

## 2016-07-29 DIAGNOSIS — N3020 Other chronic cystitis without hematuria: Secondary | ICD-10-CM | POA: Diagnosis not present

## 2016-08-03 ENCOUNTER — Other Ambulatory Visit: Payer: Self-pay | Admitting: Internal Medicine

## 2016-08-03 ENCOUNTER — Telehealth: Payer: Self-pay | Admitting: Gastroenterology

## 2016-08-03 MED ORDER — OMEPRAZOLE 40 MG PO CPDR: 40.0000 mg | DELAYED_RELEASE_CAPSULE | Freq: Two times a day (BID) | ORAL | 0 refills | Status: DC

## 2016-08-03 NOTE — Telephone Encounter (Signed)
Informed patient we will send omeprazole to her pharmacy and patient notified to keep appt for further refills.

## 2016-08-06 ENCOUNTER — Ambulatory Visit (INDEPENDENT_AMBULATORY_CARE_PROVIDER_SITE_OTHER): Payer: Medicare Other | Admitting: Psychology

## 2016-08-06 DIAGNOSIS — F332 Major depressive disorder, recurrent severe without psychotic features: Secondary | ICD-10-CM | POA: Diagnosis not present

## 2016-08-09 ENCOUNTER — Other Ambulatory Visit: Payer: Self-pay | Admitting: Gastroenterology

## 2016-08-17 ENCOUNTER — Ambulatory Visit: Payer: Self-pay

## 2016-08-20 ENCOUNTER — Ambulatory Visit: Payer: Medicare Other | Admitting: Psychology

## 2016-08-30 NOTE — Telephone Encounter (Signed)
Voicemail did not state a name. No message left.

## 2016-09-03 ENCOUNTER — Ambulatory Visit (INDEPENDENT_AMBULATORY_CARE_PROVIDER_SITE_OTHER): Payer: Medicare Other | Admitting: Behavioral Health

## 2016-09-03 ENCOUNTER — Ambulatory Visit (INDEPENDENT_AMBULATORY_CARE_PROVIDER_SITE_OTHER): Payer: Medicare Other | Admitting: Psychology

## 2016-09-03 DIAGNOSIS — E538 Deficiency of other specified B group vitamins: Secondary | ICD-10-CM

## 2016-09-03 DIAGNOSIS — F332 Major depressive disorder, recurrent severe without psychotic features: Secondary | ICD-10-CM

## 2016-09-03 MED ORDER — CYANOCOBALAMIN 1000 MCG/ML IJ SOLN
1000.0000 ug | Freq: Once | INTRAMUSCULAR | Status: AC
  Administered 2016-09-03: 1000 ug via INTRAMUSCULAR

## 2016-09-03 NOTE — Progress Notes (Addendum)
Pre visit review using our clinic review tool, if applicable. No additional management support is needed unless otherwise documented below in the visit note.  Patient came in clinic for monthly B12 injection. IM injection given in the left deltoid. Patient tolerated it well. Next appointment scheduled for 10/01/16 at 2:15 PM.   Kathlene November, MD

## 2016-09-08 NOTE — Progress Notes (Addendum)
Subjective:   [name] is a 71 y.o. female who presents for Medicare Annual (Subsequent) preventive examination.  Review of Systems:  No ROS.  Medicare Wellness Visit. Additional risk factors are reflected in the social history.  Cardiac Risk Factors include: advanced age (>7men, >65 women);dyslipidemia;hypertension;sedentary lifestyle Sleep patterns: Takes trazodone to sleep. Seeps about 8 hrs.  Home Safety/Smoke Alarms: Feels safe in home. Smoke alarms in place.  Living environment; residence and Firearm Safety: Lives with adult son. One story. Guns safely stored. Seat Belt Safety/Bike Helmet: Wears seat belt.   Counseling:   Eye Exam- reading glasses. Dr.Bigsby yearly Dental- Just got a dentist in [place]. Has appt soon.  Female:   Pap- Hysterectomy      Mammo- Last 10/24/14: No result on file. ORDERED TODAY Dexa scan- Last 09/23/13: Osteopenia. ORDERED TODAY      CCS- Last 12/18/14: Polyps removed were not precancerous. No future routine screening advised due to age.  Objective:     Vitals: BP (!) 142/84 (BP Location: Right Wrist, Patient Position: Sitting, Cuff Size: Normal)   Pulse 100   Ht 5\' 6"  (1.676 m)   Wt 247 lb 12.8 oz (112.4 kg)   SpO2 98%   BMI 40.00 kg/m   Body mass index is 40 kg/m.   Tobacco History  Smoking Status  . Former Smoker  . Packs/day: 0.50  . Years: 10.00  . Quit date: 02/09/1975  Smokeless Tobacco  . Never Used    Comment: started at age 63.   quit in the 1970s.     Counseling given: Not Answered   Past Medical History:  Diagnosis Date  . Anemia   . Anxiety   . Barrett's esophagus   . Bilateral carpal tunnel syndrome    Dr. Eddie Dibbles, having injection therapy  . C. difficile diarrhea 08/01/2012   severe 2014  . Chronic cystitis    Dr. Matilde Sprang  . Chronic lower back pain   . Depression   . DJD (degenerative joint disease)    bilateral hands; knees  . Family history of anesthesia complication    Mother had severe N/V    . GERD (gastroesophageal reflux disease)   . H/O cardiac catheterization    (-) cath 12-2002  , cath again 2011 (-)  . HTN (hypertension)   . Hyperlipidemia   . Insomnia   . Migraine   . Osteoporosis    pt unsure of this  . RLS (restless legs syndrome)   . Vitamin B 12 deficiency 04/09/2013   Past Surgical History:  Procedure Laterality Date  . Arm surgery Left    "don't remember what they did; arm wasn't broken"  . BILATERAL KNEE ARTHROSCOPY Bilateral   . CARDIAC CATHETERIZATION  01/22/10   clean cath  . CATARACT EXTRACTION W/ INTRAOCULAR LENS  IMPLANT, BILATERAL Bilateral   . COLONOSCOPY W/ POLYPECTOMY    . FOOT SURGERY Bilateral    toenails removed; callus removed on right; hammertoes right"  . Knot     "removed from right neck; not a goiter"  . LUMBAR DISC SURGERY     L5 S1 anterior fusion  . OOPHORECTOMY    . SHOULDER ARTHROSCOPY Right   . TOTAL KNEE ARTHROPLASTY  10/05/2011   Procedure: TOTAL KNEE ARTHROPLASTY;  Surgeon: Hessie Dibble, MD;  Location: [place] [facility];  Service: Orthopedics;  Laterality: Right;  [name] VAGINAL HYSTERECTOMY     for endometriosis   Family History  Problem Relation Age of Onset  . Lung  cancer Mother   . Dementia Father   . CAD Father        dx in his 64s  . Stroke Father   . Colon cancer Maternal Grandfather   . Esophageal cancer Brother   . Breast cancer Sister   . Diabetes Other        Aunt  . Colon cancer Maternal Uncle        2  . Rectal cancer Neg Hx   . Stomach cancer Neg Hx    History  Sexual Activity  . Sexual activity: Yes    Outpatient Encounter Prescriptions as of 09/15/2016  Medication Sig  . aspirin EC 81 MG tablet Take 81 mg by mouth daily.  . benazepril (LOTENSIN) 20 MG tablet Take 1 tablet (20 mg total) by mouth daily.  . clonazePAM (KLONOPIN) 0.5 MG tablet Take 1 tablet (0.5 mg total) by mouth 3 (three) times daily as needed for anxiety.  [name] HYDROcodone-acetaminophen (NORCO) 10-325 MG tablet Take 1 tablet by mouth every  4 (four) hours as needed. For back pain  . mirabegron ER (MYRBETRIQ) 25 MG TB24 tablet Take 25 mg by mouth daily.  [name] omeprazole (PRILOSEC) 40 MG capsule Take 1 capsule (40 mg total) by mouth 2 (two) times daily.  . ranitidine (ZANTAC) 300 MG tablet Take 1 tablet (300 mg total) by mouth at bedtime.  . ranitidine (ZANTAC) 300 MG tablet TAKE 1 TABLET BY MOUTH AT BEDTIME *NEED OFFICE VISIT FOR FURTHER REFILLS  . rOPINIRole (REQUIP) 1 MG tablet TAKE 1 TABLET (1 MG TOTAL) BY MOUTH 2 (TWO) TIMES DAILY.  . traZODone (DESYREL) 50 MG tablet Take 1-2 tablets (50-100 mg total) by mouth at bedtime.  [name] venlafaxine XR (EFFEXOR-XR) 75 MG 24 hr capsule Take 3 capsules (225 mg total) by mouth daily with breakfast.  . Calcium Carbonate-Vitamin D (CALCIUM PLUS VITAMIN D PO) Take by mouth daily.   Facility-Administered Encounter Medications as of 09/15/2016  Medication  . cyanocobalamin ((VITAMIN B-12)) injection 1,000 mcg    Activities of Daily Living In your present state of health, do you have any difficulty performing the following activities: 09/15/2016 12/16/2015  Hearing? N N  Vision? N N  Difficulty concentrating or making decisions? N N  Walking or climbing stairs? Y N  Comment "bad knees" -  Dressing or bathing? N N  Doing errands, shopping? N N  Preparing Food and eating ? N -  Using the Toilet? N -  In the past six months, have you accidently leaked urine? N -  Do you have problems with loss of bowel control? N -  Managing your Medications? N -  Managing your Finances? Y -  Comment Done by adult son that lives with her -  Housekeeping or managing your Housekeeping? N -  Some recent data might be hidden    Patient Care Team: Colon Branch, MD as PCP - General Bjorn Loser, MD as Consulting Physician (Urology) Maia Breslow, MD as Consulting Physician (Orthopedic Surgery) Alden Hipp, MD as Consulting Physician (Obstetrics and Gynecology) Phylliss Bob, MD as Consulting Physician  (Orthopedic Surgery)    Assessment:    Physical assessment deferred to PCP.  Exercise Activities and Dietary recommendations Current Exercise Habits: The patient does not participate in regular exercise at present, Exercise limited by: None identified (pt states it is too hot)   Diet (meal preparation, eat out, water intake, caffeinated beverages, dairy products, fruits and vegetables): well balanced  24 HOUR RECALL Breakfast: Yogurt and coffee  Lunch: chicken salad wrap Dinner: Tomato sandwich  Goals      Patient Stated   . Pt states she would like to come off of BP meds (pt-stated)          By limiting salt intake, eating a healthy diet, and exercising when the weather is cooler. She will also increase water intake.      Fall Risk Fall Risk  09/15/2016 09/15/2015 07/10/2015 03/17/2015 09/13/2014  Falls in the past year? Yes No Yes No No  Number falls in past yr: 2 or more - 1 - -  Injury with Fall? Yes - Yes - -  Comment - - Minor  - -  Follow up Education provided;Falls prevention discussed - Falls evaluation completed - -   Depression Screen PHQ 2/9 Scores 09/15/2016 09/15/2015 07/10/2015 03/17/2015  PHQ - 2 Score 0 0 0 0  Exception Documentation - - Patient refusal -     Cognitive Function MMSE - Mini Mental State Exam 09/15/2016  Orientation to time 5  Orientation to Place 5  Registration 3  Attention/ Calculation 3  Recall 3  Language- name 2 objects 2  Language- repeat 1  Language- follow 3 step command 3  Language- read & follow direction 1  Write a sentence 1  Copy design 1  Total score 28        Immunization History  Administered Date(s) Administered  . Influenza Split 02/17/2011  . Influenza Whole 01/14/2010  . Influenza, High Dose Seasonal PF 01/24/2015  . Influenza,inj,Quad PF,36+ Mos 10/22/2013  . Influenza-Unspecified 03/13/2016  . Pneumococcal Conjugate-13 09/10/2013  . Pneumococcal Polysaccharide-23 04/14/2010  . Td 02/08/1998, 04/14/2010  . Zoster  12/06/2013   Screening Tests Health Maintenance  Topic Date Due  . MAMMOGRAM  10/24/2015  . INFLUENZA VACCINE  09/08/2016  . TETANUS/TDAP  04/13/2020  . COLONOSCOPY  12/17/2024  . DEXA SCAN  Completed  . Hepatitis C Screening  Completed  . PNA vac Low Risk Adult  Completed      Plan:  Follow up with Dr.Paz as scheduled.  Continue to eat heart healthy diet (full of fruits, vegetables, whole grains, lean protein, water--limit salt, fat, and sugar intake) and increase physical activity as tolerated.  Continue doing brain stimulating activities (puzzles, reading, adult coloring books, staying active) to keep memory sharp.   I have placed an order for your mammogram and bone density scan. They will call you to schedule.  I have personally reviewed and noted the following in the patient's chart:   . Medical and social history . Use of alcohol, tobacco or illicit drugs  . Current medications and supplements . Functional ability and status . Nutritional status . Physical activity . Advanced directives . List of other physicians . Hospitalizations, surgeries, and ER visits in previous 12 months . Vitals . Screenings to include cognitive, depression, and falls . Referrals and appointments  In addition, I have reviewed and discussed with patient certain preventive protocols, quality metrics, and best practice recommendations. A written personalized care plan for preventive services as well as general preventive health recommendations were provided to patient.     Naaman Plummer [place], [place]  09/15/2016   Kathlene November, MD

## 2016-09-15 ENCOUNTER — Ambulatory Visit (INDEPENDENT_AMBULATORY_CARE_PROVIDER_SITE_OTHER): Payer: Medicare Other | Admitting: *Deleted

## 2016-09-15 ENCOUNTER — Encounter: Payer: Self-pay | Admitting: *Deleted

## 2016-09-15 VITALS — BP 142/84 | HR 100 | Ht 66.0 in | Wt 247.8 lb

## 2016-09-15 DIAGNOSIS — Z1239 Encounter for other screening for malignant neoplasm of breast: Secondary | ICD-10-CM

## 2016-09-15 DIAGNOSIS — Z780 Asymptomatic menopausal state: Secondary | ICD-10-CM

## 2016-09-15 DIAGNOSIS — Z0000 Encounter for general adult medical examination without abnormal findings: Secondary | ICD-10-CM

## 2016-09-15 NOTE — Patient Instructions (Addendum)
[name] , Thank you for taking time to come for your Medicare Wellness Visit. I appreciate your ongoing commitment to your health goals. Please review the following plan we discussed and let me know if I can assist you in the future.   These are the goals we discussed: Goals      Patient Stated   . Pt states she would like to come off of BP meds (pt-stated)          By limiting salt intake, eating a healthy diet, and exercising when the weather is cooler. She will also increase water intake.       This is a list of the screening recommended for you and due dates:  Health Maintenance  Topic Date Due  . Mammogram  10/24/2015  . Flu Shot  09/08/2016  . Tetanus Vaccine  04/13/2020  . Colon Cancer Screening  12/17/2024  . DEXA scan (bone density measurement)  Completed  .  Hepatitis C: One time screening is recommended by Center for Disease Control  (CDC) for  adults born from [birth year] through 1966.   Completed  . Pneumonia vaccines  Completed   Follow up with Dr.Paz as scheduled.  Continue to eat heart healthy diet (full of fruits, vegetables, whole grains, lean protein, water--limit salt, fat, and sugar intake) and increase physical activity as tolerated.  Continue doing brain stimulating activities (puzzles, reading, adult coloring books, staying active) to keep memory sharp.   I have placed an order for your mammogram and bone density scan. They will call you to schedule.    DASH Eating Plan DASH stands for "Dietary Approaches to Stop Hypertension." The DASH eating plan is a healthy eating plan that has been shown to reduce high blood pressure (hypertension). It may also reduce your risk for type 2 diabetes, heart disease, and stroke. The DASH eating plan may also help with weight loss. What are tips for following this plan? General guidelines  Avoid eating more than 2,300 mg (milligrams) of salt (sodium) a day. If you have hypertension, you may need to reduce your sodium  intake to 1,500 mg a day.  Limit alcohol intake to no more than 1 drink a day for nonpregnant women and 2 drinks a day for men. One drink equals 12 oz of beer, 5 oz of wine, or 1 oz of hard liquor.  Work with your health care provider to maintain a healthy body weight or to lose weight. Ask what an ideal weight is for you.  Get at least 30 minutes of exercise that causes your heart to beat faster (aerobic exercise) most days of the week. Activities may include walking, swimming, or biking.  Work with your health care provider or diet and nutrition specialist (dietitian) to adjust your eating plan to your individual calorie needs. Reading food labels  Check food labels for the amount of sodium per serving. Choose foods with less than 5 percent of the Daily Value of sodium. Generally, foods with less than 300 mg of sodium per serving fit into this eating plan.  To find whole grains, look for the word "whole" as the first word in the ingredient list. Shopping  Buy products labeled as "low-sodium" or "no salt added."  Buy fresh foods. Avoid canned foods and premade or frozen meals. Cooking  Avoid adding salt when cooking. Use salt-free seasonings or herbs instead of table salt or sea salt. Check with your health care provider or pharmacist before using salt substitutes.  Do not  fry foods. Cook foods using healthy methods such as baking, boiling, grilling, and broiling instead.  Cook with heart-healthy oils, such as olive, canola, soybean, or sunflower oil. Meal planning   Eat a balanced diet that includes: ? 5 or more servings of fruits and vegetables each day. At each meal, try to fill half of your plate with fruits and vegetables. ? Up to 6-8 servings of whole grains each day. ? Less than 6 oz of lean meat, poultry, or fish each day. A 3-oz serving of meat is about the same size as a deck of cards. One egg equals 1 oz. ? 2 servings of low-fat dairy each day. ? A serving of nuts,  seeds, or beans 5 times each week. ? Heart-healthy fats. Healthy fats called Omega-3 fatty acids are found in foods such as flaxseeds and coldwater fish, like sardines, salmon, and mackerel.  Limit how much you eat of the following: ? Canned or prepackaged foods. ? Food that is high in trans fat, such as fried foods. ? Food that is high in saturated fat, such as fatty meat. ? Sweets, desserts, sugary drinks, and other foods with added sugar. ? Full-fat dairy products.  Do not salt foods before eating.  Try to eat at least 2 vegetarian meals each week.  Eat more home-cooked food and less restaurant, buffet, and fast food.  When eating at a restaurant, ask that your food be prepared with less salt or no salt, if possible. What foods are recommended? The items listed may not be a complete list. Talk with your dietitian about what dietary choices are best for you. Grains Whole-grain or whole-wheat bread. Whole-grain or whole-wheat pasta. Brown rice. [place]. Bulgur. Whole-grain and low-sodium cereals. Pita bread. Low-fat, low-sodium crackers. Whole-wheat flour tortillas. Vegetables Fresh or frozen vegetables (raw, steamed, roasted, or grilled). Low-sodium or reduced-sodium tomato and vegetable juice. Low-sodium or reduced-sodium tomato sauce and tomato paste. Low-sodium or reduced-sodium canned vegetables. Fruits All fresh, dried, or frozen fruit. Canned fruit in natural juice (without added sugar). Meat and other protein foods Skinless chicken or [place]. Ground chicken or [place]. Pork with fat trimmed off. Fish and seafood. Egg whites. Dried beans, peas, or lentils. Unsalted nuts, nut butters, and seeds. Unsalted canned beans. Lean cuts of beef with fat trimmed off. Low-sodium, lean deli meat. Dairy Low-fat (1%) or fat-free (skim) milk. Fat-free, low-fat, or reduced-fat cheeses. Nonfat, low-sodium ricotta or cottage cheese. Low-fat or nonfat yogurt. Low-fat, low-sodium cheese. Fats  and oils Soft margarine without trans fats. Vegetable oil. Low-fat, reduced-fat, or light mayonnaise and salad dressings (reduced-sodium). Canola, safflower, olive, soybean, and sunflower oils. Avocado. Seasoning and other foods Herbs. Spices. Seasoning mixes without salt. Unsalted popcorn and pretzels. Fat-free sweets. What foods are not recommended? The items listed may not be a complete list. Talk with your dietitian about what dietary choices are best for you. Grains Baked goods made with fat, such as croissants, muffins, or some breads. Dry pasta or rice meal packs. Vegetables Creamed or fried vegetables. Vegetables in a cheese sauce. Regular canned vegetables (not low-sodium or reduced-sodium). Regular canned tomato sauce and paste (not low-sodium or reduced-sodium). Regular tomato and vegetable juice (not low-sodium or reduced-sodium). Angie Fava. Olives. Fruits Canned fruit in a light or heavy syrup. Fried fruit. Fruit in cream or butter sauce. Meat and other protein foods Fatty cuts of meat. Ribs. Fried meat. Berniece Salines. Sausage. Bologna and other processed lunch meats. Salami. Fatback. Hotdogs. Bratwurst. Salted nuts and seeds. Canned beans with added  salt. Canned or smoked fish. Whole eggs or egg yolks. Chicken or [place] with skin. Dairy Whole or 2% milk, cream, and half-and-half. Whole or full-fat cream cheese. Whole-fat or sweetened yogurt. Full-fat cheese. Nondairy creamers. Whipped toppings. Processed cheese and cheese spreads. Fats and oils Butter. Stick margarine. Lard. Shortening. Ghee. Bacon fat. Tropical oils, such as coconut, palm kernel, or palm oil. Seasoning and other foods Salted popcorn and pretzels. Onion salt, garlic salt, seasoned salt, table salt, and sea salt. Worcestershire sauce. Tartar sauce. Barbecue sauce. Teriyaki sauce. Soy sauce, including reduced-sodium. Steak sauce. Canned and packaged gravies. Fish sauce. Oyster sauce. Cocktail sauce. Horseradish that you find on  the shelf. Ketchup. Mustard. Meat flavorings and tenderizers. Bouillon cubes. Hot sauce and Tabasco sauce. Premade or packaged marinades. Premade or packaged taco seasonings. Relishes. Regular salad dressings. Where to find more information:  National Heart, Lung, and [place]: https://wilson-eaton.com/  American Heart Association: www.heart.org Summary  The DASH eating plan is a healthy eating plan that has been shown to reduce high blood pressure (hypertension). It may also reduce your risk for type 2 diabetes, heart disease, and stroke.  With the DASH eating plan, you should limit salt (sodium) intake to 2,300 mg a day. If you have hypertension, you may need to reduce your sodium intake to 1,500 mg a day.  When on the DASH eating plan, aim to eat more fresh fruits and vegetables, whole grains, lean proteins, low-fat dairy, and heart-healthy fats.  Work with your health care provider or diet and nutrition specialist (dietitian) to adjust your eating plan to your individual calorie needs. This information is not intended to replace advice given to you by your health care provider. Make sure you discuss any questions you have with your health care provider. Document Released: 01/14/2011 Document Revised: 01/19/2016 Document Reviewed: 01/19/2016 Elsevier Interactive Patient Education  2017 [employer] Maintenance for Postmenopausal Women Menopause is a normal process in which your reproductive ability comes to an end. This process happens gradually over a span of months to years, usually between the ages of 106 and 71. Menopause is complete when you have missed 12 consecutive menstrual periods. It is important to talk with your health care provider about some of the most common conditions that affect postmenopausal women, such as heart disease, cancer, and bone loss (osteoporosis). Adopting a healthy lifestyle and getting preventive care can help to promote your health and wellness.  Those actions can also lower your chances of developing some of these common conditions. What should I know about menopause? During menopause, you may experience a number of symptoms, such as:  Moderate-to-severe hot flashes.  Night sweats.  Decrease in sex drive.  Mood swings.  Headaches.  Tiredness.  Irritability.  Memory problems.  Insomnia.  Choosing to treat or not to treat menopausal changes is an individual decision that you make with your health care provider. What should I know about hormone replacement therapy and supplements? Hormone therapy products are effective for treating symptoms that are associated with menopause, such as hot flashes and night sweats. Hormone replacement carries certain risks, especially as you become older. If you are thinking about using estrogen or estrogen with progestin treatments, discuss the benefits and risks with your health care provider. What should I know about heart disease and stroke? Heart disease, heart attack, and stroke become more likely as you age. This may be due, in part, to the hormonal changes that your body experiences during menopause. These can affect how  your body processes dietary fats, triglycerides, and cholesterol. Heart attack and stroke are both medical emergencies. There are many things that you can do to help prevent heart disease and stroke:  Have your blood pressure checked at least every 1-2 years. High blood pressure causes heart disease and increases the risk of stroke.  If you are 42-96 years old, ask your health care provider if you should take aspirin to prevent a heart attack or a stroke.  Do not use any tobacco products, including cigarettes, chewing tobacco, or electronic cigarettes. If you need help quitting, ask your health care provider.  It is important to eat a healthy diet and maintain a healthy weight. ? Be sure to include plenty of vegetables, fruits, low-fat dairy products, and lean  protein. ? Avoid eating foods that are high in solid fats, added sugars, or salt (sodium).  Get regular exercise. This is one of the most important things that you can do for your health. ? Try to exercise for at least 150 minutes each week. The type of exercise that you do should increase your heart rate and make you sweat. This is known as moderate-intensity exercise. ? Try to do strengthening exercises at least twice each week. Do these in addition to the moderate-intensity exercise.  Know your numbers.Ask your health care provider to check your cholesterol and your blood glucose. Continue to have your blood tested as directed by your health care provider.  What should I know about cancer screening? There are several types of cancer. Take the following steps to reduce your risk and to catch any cancer development as early as possible. Breast Cancer  Practice breast self-awareness. ? This means understanding how your breasts normally appear and feel. ? It also means doing regular breast self-exams. Let your health care provider know about any changes, no matter how small.  If you are 11 or older, have a clinician do a breast exam (clinical breast exam or CBE) every year. Depending on your age, family history, and medical history, it may be recommended that you also have a yearly breast X-ray (mammogram).  If you have a family history of breast cancer, talk with your health care provider about genetic screening.  If you are at high risk for breast cancer, talk with your health care provider about having an MRI and a mammogram every year.  Breast cancer (BRCA) gene test is recommended for women who have family members with BRCA-related cancers. Results of the assessment will determine the need for genetic counseling and BRCA1 and for BRCA2 testing. BRCA-related cancers include these types: ? Breast. This occurs in males or females. ? Ovarian. ? Tubal. This may also be called fallopian tube  cancer. ? Cancer of the abdominal or pelvic lining (peritoneal cancer). ? Prostate. ? Pancreatic.  Cervical, Uterine, and Ovarian Cancer Your health care provider may recommend that you be screened regularly for cancer of the pelvic organs. These include your ovaries, uterus, and vagina. This screening involves a pelvic exam, which includes checking for microscopic changes to the surface of your cervix (Pap test).  For women ages 21-65, health care providers may recommend a pelvic exam and a Pap test every three years. For women ages 51-65, they may recommend the Pap test and pelvic exam, combined with testing for human papilloma virus (HPV), every five years. Some types of HPV increase your risk of cervical cancer. Testing for HPV may also be done on women of any age who have unclear Pap test  results.  Other health care providers may not recommend any screening for nonpregnant women who are considered low risk for pelvic cancer and have no symptoms. Ask your health care provider if a screening pelvic exam is right for you.  If you have had past treatment for cervical cancer or a condition that could lead to cancer, you need Pap tests and screening for cancer for at least 20 years after your treatment. If Pap tests have been discontinued for you, your risk factors (such as having a new sexual partner) need to be reassessed to determine if you should start having screenings again. Some women have medical problems that increase the chance of getting cervical cancer. In these cases, your health care provider may recommend that you have screening and Pap tests more often.  If you have a family history of uterine cancer or ovarian cancer, talk with your health care provider about genetic screening.  If you have vaginal bleeding after reaching menopause, tell your health care provider.  There are currently no reliable tests available to screen for ovarian cancer.  Lung Cancer Lung cancer screening is  recommended for adults 17-72 years old who are at high risk for lung cancer because of a history of smoking. A yearly low-dose CT scan of the lungs is recommended if you:  Currently smoke.  Have a history of at least 30 pack-years of smoking and you currently smoke or have quit within the past 15 years. A pack-year is smoking an average of one pack of cigarettes per day for one year.  Yearly screening should:  Continue until it has been 15 years since you quit.  Stop if you develop a health problem that would prevent you from having lung cancer treatment.  Colorectal Cancer  This type of cancer can be detected and can often be prevented.  Routine colorectal cancer screening usually begins at age 52 and continues through age 96.  If you have risk factors for colon cancer, your health care provider may recommend that you be screened at an earlier age.  If you have a family history of colorectal cancer, talk with your health care provider about genetic screening.  Your health care provider may also recommend using home test kits to check for hidden blood in your stool.  A small camera at the end of a tube can be used to examine your colon directly (sigmoidoscopy or colonoscopy). This is done to check for the earliest forms of colorectal cancer.  Direct examination of the colon should be repeated every 5-10 years until age 47. However, if early forms of precancerous polyps or small growths are found or if you have a family history or genetic risk for colorectal cancer, you may need to be screened more often.  Skin Cancer  Check your skin from head to toe regularly.  Monitor any moles. Be sure to tell your health care provider: ? About any new moles or changes in moles, especially if there is a change in a mole's shape or color. ? If you have a mole that is larger than the size of a pencil eraser.  If any of your family members has a history of skin cancer, especially at a young age,  talk with your health care provider about genetic screening.  Always use sunscreen. Apply sunscreen liberally and repeatedly throughout the day.  Whenever you are outside, protect yourself by wearing long sleeves, pants, a wide-brimmed hat, and sunglasses.  What should I know about osteoporosis? Osteoporosis is  a condition in which bone destruction happens more quickly than new bone creation. After menopause, you may be at an increased risk for osteoporosis. To help prevent osteoporosis or the bone fractures that can happen because of osteoporosis, the following is recommended:  If you are 68-2 years old, get at least 1,000 mg of calcium and at least 600 mg of vitamin D per day.  If you are older than age 32 but younger than age 62, get at least 1,200 mg of calcium and at least 600 mg of vitamin D per day.  If you are older than age 54, get at least 1,200 mg of calcium and at least 800 mg of vitamin D per day.  Smoking and excessive alcohol intake increase the risk of osteoporosis. Eat foods that are rich in calcium and vitamin D, and do weight-bearing exercises several times each week as directed by your health care provider. What should I know about how menopause affects my mental health? Depression may occur at any age, but it is more common as you become older. Common symptoms of depression include:  Low or sad mood.  Changes in sleep patterns.  Changes in appetite or eating patterns.  Feeling an overall lack of motivation or enjoyment of activities that you previously enjoyed.  Frequent crying spells.  Talk with your health care provider if you think that you are experiencing depression. What should I know about immunizations? It is important that you get and maintain your immunizations. These include:  Tetanus, diphtheria, and pertussis (Tdap) booster vaccine.  Influenza every year before the flu season begins.  Pneumonia vaccine.  Shingles vaccine.  Your health care  provider may also recommend other immunizations. This information is not intended to replace advice given to you by your health care provider. Make sure you discuss any questions you have with your health care provider. Document Released: 03/19/2005 Document Revised: 08/15/2015 Document Reviewed: 10/29/2014 Elsevier Interactive Patient Education  2018 [employer].

## 2016-09-20 ENCOUNTER — Ambulatory Visit (HOSPITAL_BASED_OUTPATIENT_CLINIC_OR_DEPARTMENT_OTHER)
Admission: RE | Admit: 2016-09-20 | Discharge: 2016-09-20 | Disposition: A | Discharge status: Home / Self Care | Payer: Medicare Other | Source: Ambulatory Visit | Attending: Internal Medicine | Admitting: Internal Medicine

## 2016-09-20 ENCOUNTER — Encounter (HOSPITAL_BASED_OUTPATIENT_CLINIC_OR_DEPARTMENT_OTHER): Payer: Self-pay

## 2016-09-20 DIAGNOSIS — Z1231 Encounter for screening mammogram for malignant neoplasm of breast: Secondary | ICD-10-CM | POA: Insufficient documentation

## 2016-09-20 DIAGNOSIS — Z1239 Encounter for other screening for malignant neoplasm of breast: Secondary | ICD-10-CM

## 2016-09-29 ENCOUNTER — Ambulatory Visit (INDEPENDENT_AMBULATORY_CARE_PROVIDER_SITE_OTHER): Payer: Medicare Other | Admitting: Gastroenterology

## 2016-09-29 ENCOUNTER — Encounter: Payer: Self-pay | Admitting: Gastroenterology

## 2016-09-29 VITALS — BP 108/70 | HR 108 | Ht 66.0 in | Wt 246.0 lb

## 2016-09-29 DIAGNOSIS — K2270 Barrett's esophagus without dysplasia: Secondary | ICD-10-CM

## 2016-09-29 DIAGNOSIS — R1013 Epigastric pain: Secondary | ICD-10-CM

## 2016-09-29 DIAGNOSIS — M25562 Pain in left knee: Secondary | ICD-10-CM | POA: Diagnosis not present

## 2016-09-29 DIAGNOSIS — M25561 Pain in right knee: Secondary | ICD-10-CM | POA: Diagnosis not present

## 2016-09-29 MED ORDER — DICYCLOMINE HCL 10 MG PO CAPS: 10.0000 mg | ORAL_CAPSULE | Freq: Three times a day (TID) | ORAL | 11 refills | Status: DC

## 2016-09-29 NOTE — Progress Notes (Signed)
    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 She relates several episodes of increased intestinal gas associated with crampy epigastric abdominal pain. Reflux symptoms are under good control.  Current Medications, Allergies, Past Medical History, Past Surgical History, Family History and Social History were reviewed in [employer] record.  Physical Exam: General: Well developed, well nourished, no acute distress Head: Normocephalic and atraumatic Eyes:  sclerae anicteric, EOMI Ears: Normal auditory acuity Mouth: No deformity or lesions Lungs: Clear throughout to auscultation Heart: Regular rate and rhythm; no murmurs, rubs or bruits Abdomen: Soft, non tender and non distended. No masses, hepatosplenomegaly or hernias noted. Normal Bowel sounds Musculoskeletal: Symmetrical with no gross deformities  Pulses:  Normal pulses noted Extremities: No clubbing, cyanosis, edema or deformities noted Neurological: Alert oriented x 4, grossly nonfocal Psychological:  Alert and cooperative. Normal mood and affect  Assessment and Recommendations:  1. Short segment Barrett's esophagus without dysplasia. Continue omeprazole 40 mg by mouth twice a day and ranitidine 300 mg at bedtime. Surveillance endoscopy recommended April 2020.  2. Epigastric pain. R/O cholelithiasis. Intestinal gas. IBS. Schedule abdominal ultrasound. Dicyclomine 10 mg 4 times a day when necessary.  3. CRC screening, average risk. Colonoscopy is up-to-date.

## 2016-09-29 NOTE — Patient Instructions (Addendum)
We have sent the following medications to your pharmacy for you to pick up at your convenience:Bentyl.  You can take over the counter Gas-X four times a day for gas and bloating.   You have been given a gas prevention diet to follow.   You have been scheduled for an abdominal ultrasound at [facility] (1st floor of hospital) on 10/04/16 at 9:30am. Please arrive 15 minutes prior to your appointment for registration. Make certain not to have anything to eat or drink after midnight. Should you need to reschedule your appointment, please contact radiology at [phone]. This test typically takes about 30 minutes to perform.  Thank you for choosing me and [facility].  Pricilla Riffle. Dagoberto Ligas., MD., [facility] Regal

## 2016-10-01 ENCOUNTER — Ambulatory Visit: Payer: Self-pay

## 2016-10-01 ENCOUNTER — Ambulatory Visit (INDEPENDENT_AMBULATORY_CARE_PROVIDER_SITE_OTHER): Payer: Medicare Other | Admitting: Psychology

## 2016-10-01 DIAGNOSIS — F332 Major depressive disorder, recurrent severe without psychotic features: Secondary | ICD-10-CM

## 2016-10-04 ENCOUNTER — Ambulatory Visit (HOSPITAL_BASED_OUTPATIENT_CLINIC_OR_DEPARTMENT_OTHER)
Admission: RE | Admit: 2016-10-04 | Discharge: 2016-10-04 | Disposition: A | Discharge status: Home / Self Care | Payer: Medicare Other | Source: Ambulatory Visit | Attending: Internal Medicine | Admitting: Internal Medicine

## 2016-10-04 ENCOUNTER — Ambulatory Visit (HOSPITAL_COMMUNITY): Admission: RE | Admit: 2016-10-04 | Payer: Medicare Other | Source: Ambulatory Visit

## 2016-10-04 DIAGNOSIS — M8580 Other specified disorders of bone density and structure, unspecified site: Secondary | ICD-10-CM | POA: Diagnosis not present

## 2016-10-04 DIAGNOSIS — M85851 Other specified disorders of bone density and structure, right thigh: Secondary | ICD-10-CM | POA: Diagnosis not present

## 2016-10-04 DIAGNOSIS — Z780 Asymptomatic menopausal state: Secondary | ICD-10-CM | POA: Insufficient documentation

## 2016-10-07 ENCOUNTER — Ambulatory Visit (HOSPITAL_COMMUNITY)
Admission: RE | Admit: 2016-10-07 | Discharge: 2016-10-07 | Disposition: A | Discharge status: Home / Self Care | Payer: Medicare Other | Source: Ambulatory Visit | Attending: Gastroenterology | Admitting: Gastroenterology

## 2016-10-07 DIAGNOSIS — R1011 Right upper quadrant pain: Secondary | ICD-10-CM | POA: Diagnosis not present

## 2016-10-07 DIAGNOSIS — R1013 Epigastric pain: Secondary | ICD-10-CM | POA: Insufficient documentation

## 2016-10-15 ENCOUNTER — Ambulatory Visit (INDEPENDENT_AMBULATORY_CARE_PROVIDER_SITE_OTHER): Payer: Medicare Other

## 2016-10-15 ENCOUNTER — Ambulatory Visit (INDEPENDENT_AMBULATORY_CARE_PROVIDER_SITE_OTHER): Payer: Medicare Other | Admitting: Psychology

## 2016-10-15 DIAGNOSIS — F332 Major depressive disorder, recurrent severe without psychotic features: Secondary | ICD-10-CM

## 2016-10-15 DIAGNOSIS — E538 Deficiency of other specified B group vitamins: Secondary | ICD-10-CM

## 2016-10-15 MED ORDER — CYANOCOBALAMIN 1000 MCG/ML IJ SOLN
1000.0000 ug | Freq: Once | INTRAMUSCULAR | Status: AC
  Administered 2016-10-15: 1000 ug via INTRAMUSCULAR

## 2016-10-15 NOTE — Progress Notes (Addendum)
Pre visit review using our clinic tool,if applicable. No additional management support is needed unless otherwise documented below in the visit note.   Patient in for B12 injection per order from Dr. Larose Kells due to patient having B12 deficiency.  No complaints voiced this visit. Given 1000 mcg IM left deltoid. Patient tolerated well.  Return appointment scheduled with patient for 1 month   Kathlene November, MD

## 2016-10-22 ENCOUNTER — Emergency Department (HOSPITAL_COMMUNITY): Payer: Medicare Other

## 2016-10-22 ENCOUNTER — Encounter (HOSPITAL_COMMUNITY): Payer: Self-pay | Admitting: Emergency Medicine

## 2016-10-22 ENCOUNTER — Inpatient Hospital Stay (HOSPITAL_COMMUNITY)
Admission: EM | Admit: 2016-10-22 | Discharge: 2016-10-26 | DRG: 418 | Disposition: A | Discharge status: Home / Self Care | Payer: Medicare Other | Attending: Internal Medicine | Admitting: Internal Medicine

## 2016-10-22 DIAGNOSIS — Z833 Family history of diabetes mellitus: Secondary | ICD-10-CM

## 2016-10-22 DIAGNOSIS — G8929 Other chronic pain: Secondary | ICD-10-CM | POA: Diagnosis present

## 2016-10-22 DIAGNOSIS — F329 Major depressive disorder, single episode, unspecified: Secondary | ICD-10-CM | POA: Diagnosis present

## 2016-10-22 DIAGNOSIS — R112 Nausea with vomiting, unspecified: Secondary | ICD-10-CM | POA: Diagnosis present

## 2016-10-22 DIAGNOSIS — K219 Gastro-esophageal reflux disease without esophagitis: Secondary | ICD-10-CM | POA: Diagnosis present

## 2016-10-22 DIAGNOSIS — R1110 Vomiting, unspecified: Secondary | ICD-10-CM | POA: Diagnosis not present

## 2016-10-22 DIAGNOSIS — I10 Essential (primary) hypertension: Secondary | ICD-10-CM | POA: Diagnosis present

## 2016-10-22 DIAGNOSIS — R7989 Other specified abnormal findings of blood chemistry: Secondary | ICD-10-CM | POA: Diagnosis not present

## 2016-10-22 DIAGNOSIS — G4700 Insomnia, unspecified: Secondary | ICD-10-CM | POA: Diagnosis present

## 2016-10-22 DIAGNOSIS — E785 Hyperlipidemia, unspecified: Secondary | ICD-10-CM | POA: Diagnosis not present

## 2016-10-22 DIAGNOSIS — K8020 Calculus of gallbladder without cholecystitis without obstruction: Secondary | ICD-10-CM | POA: Diagnosis present

## 2016-10-22 DIAGNOSIS — R1011 Right upper quadrant pain: Secondary | ICD-10-CM | POA: Diagnosis not present

## 2016-10-22 DIAGNOSIS — K851 Biliary acute pancreatitis without necrosis or infection: Secondary | ICD-10-CM | POA: Diagnosis not present

## 2016-10-22 DIAGNOSIS — Z96651 Presence of right artificial knee joint: Secondary | ICD-10-CM | POA: Diagnosis present

## 2016-10-22 DIAGNOSIS — Z8249 Family history of ischemic heart disease and other diseases of the circulatory system: Secondary | ICD-10-CM

## 2016-10-22 DIAGNOSIS — R197 Diarrhea, unspecified: Secondary | ICD-10-CM | POA: Diagnosis not present

## 2016-10-22 DIAGNOSIS — G2581 Restless legs syndrome: Secondary | ICD-10-CM | POA: Diagnosis not present

## 2016-10-22 DIAGNOSIS — K859 Acute pancreatitis without necrosis or infection, unspecified: Secondary | ICD-10-CM | POA: Diagnosis present

## 2016-10-22 DIAGNOSIS — K8500 Idiopathic acute pancreatitis without necrosis or infection: Secondary | ICD-10-CM

## 2016-10-22 DIAGNOSIS — R748 Abnormal levels of other serum enzymes: Secondary | ICD-10-CM

## 2016-10-22 DIAGNOSIS — Z90710 Acquired absence of both cervix and uterus: Secondary | ICD-10-CM

## 2016-10-22 DIAGNOSIS — Z6841 Body Mass Index (BMI) 40.0 and over, adult: Secondary | ICD-10-CM

## 2016-10-22 DIAGNOSIS — E876 Hypokalemia: Secondary | ICD-10-CM | POA: Diagnosis present

## 2016-10-22 DIAGNOSIS — F5104 Psychophysiologic insomnia: Secondary | ICD-10-CM | POA: Diagnosis present

## 2016-10-22 DIAGNOSIS — R945 Abnormal results of liver function studies: Secondary | ICD-10-CM | POA: Diagnosis not present

## 2016-10-22 DIAGNOSIS — Z87891 Personal history of nicotine dependence: Secondary | ICD-10-CM

## 2016-10-22 DIAGNOSIS — R1084 Generalized abdominal pain: Secondary | ICD-10-CM | POA: Diagnosis not present

## 2016-10-22 DIAGNOSIS — F419 Anxiety disorder, unspecified: Secondary | ICD-10-CM | POA: Diagnosis present

## 2016-10-22 DIAGNOSIS — D72829 Elevated white blood cell count, unspecified: Secondary | ICD-10-CM | POA: Diagnosis present

## 2016-10-22 DIAGNOSIS — Z823 Family history of stroke: Secondary | ICD-10-CM

## 2016-10-22 DIAGNOSIS — Z7982 Long term (current) use of aspirin: Secondary | ICD-10-CM

## 2016-10-22 DIAGNOSIS — K2270 Barrett's esophagus without dysplasia: Secondary | ICD-10-CM | POA: Diagnosis present

## 2016-10-22 LAB — COMPREHENSIVE METABOLIC PANEL
ALT: 239 U/L — ABNORMAL HIGH (ref 14–54)
AST: 380 U/L — ABNORMAL HIGH (ref 15–41)
Albumin: 3.6 g/dL (ref 3.5–5.0)
Alkaline Phosphatase: 116 U/L (ref 38–126)
Anion gap: 6 (ref 5–15)
BUN: 16 mg/dL (ref 6–20)
CO2: 25 mmol/L (ref 22–32)
Calcium: 8.6 mg/dL — ABNORMAL LOW (ref 8.9–10.3)
Chloride: 107 mmol/L (ref 101–111)
Creatinine, Ser: 1.01 mg/dL — ABNORMAL HIGH (ref 0.44–1.00)
GFR calc Af Amer: >60 mL/min (ref >60)
GFR calc non Af Amer: 54 mL/min — ABNORMAL LOW (ref >60)
Glucose, Bld: 110 mg/dL — ABNORMAL HIGH (ref 65–99)
Potassium: 4.1 mmol/L (ref 3.5–5.1)
Sodium: 138 mmol/L (ref 135–145)
Total Bilirubin: 0.8 mg/dL (ref 0.3–1.2)
Total Protein: 7.2 g/dL (ref 6.5–8.1)

## 2016-10-22 LAB — LIPASE, BLOOD: Lipase: 835 U/L — ABNORMAL HIGH (ref 11–51)

## 2016-10-22 LAB — TYPE AND SCREEN
ABO/RH(D): O NEG
Antibody Screen: NEGATIVE

## 2016-10-22 LAB — CBC
HCT: 38.1 % (ref 36.0–46.0)
Hemoglobin: 12.2 g/dL (ref 12.0–15.0)
MCH: 29.6 pg (ref 26.0–34.0)
MCHC: 32 g/dL (ref 30.0–36.0)
MCV: 92.5 fL (ref 78.0–100.0)
Platelets: 237 10*3/uL (ref 150–400)
RBC: 4.12 MIL/uL (ref 3.87–5.11)
RDW: 13.8 % (ref 11.5–15.5)
WBC: 11 10*3/uL — ABNORMAL HIGH (ref 4.0–10.5)

## 2016-10-22 LAB — POC OCCULT BLOOD, ED: Fecal Occult Bld: NEGATIVE

## 2016-10-22 MED ORDER — SODIUM CHLORIDE 0.9 % IV BOLUS (SEPSIS)
1000.0000 mL | Freq: Once | INTRAVENOUS | Status: AC
  Administered 2016-10-22: 1000 mL via INTRAVENOUS

## 2016-10-22 MED ORDER — IOPAMIDOL (ISOVUE-300) INJECTION 61%
INTRAVENOUS | Status: AC
  Administered 2016-10-23: 100 mL
  Filled 2016-10-22: qty 100

## 2016-10-22 MED ORDER — FENTANYL CITRATE (PF) 100 MCG/2ML IJ SOLN
50.0000 ug | Freq: Once | INTRAMUSCULAR | Status: AC
  Administered 2016-10-23: 50 ug via INTRAVENOUS
  Filled 2016-10-22: qty 2

## 2016-10-22 MED ORDER — ONDANSETRON HCL 4 MG/2ML IJ SOLN
4.0000 mg | Freq: Once | INTRAMUSCULAR | Status: AC
  Administered 2016-10-22: 4 mg via INTRAVENOUS
  Filled 2016-10-22: qty 2

## 2016-10-22 NOTE — ED Provider Notes (Signed)
[facility] DEPT Provider Note   CSN: [id number] Arrival date & time: 10/22/16  1755     History   Chief Complaint Chief Complaint  Patient presents with  . Abdominal Pain  . GI Bleeding    HPI [name] is a 71 y.o. female.  [name] is a 71 y.o. Female who presents to the ED complaining of right upper quadrant abdominal pain, nausea, vomiting and diarrhea beginning today. Patient reports having mild right upper quadrant pain yesterday that has worsened today. She reports having 7 episodes of vomiting and about 7 episodes of diarrhea today. She reports loose black stool. No hematemesis. She reports taking a daily baby aspirin. No NSAID use or anticoagulation use. No treatments attempted prior to her arrival today. She is not using Pepto-Bismol. Previous abdominal surgical history includes a hysterectomy. She reports lots of problems with acid reflux chronically and takes several medications for this. No history of previous GI bleed. She denies alcohol use.  She denies fevers, hematemesis, lightheadedness, syncope, shortness of breath, urinary symptoms or rashes.   The history is provided by the patient, medical records and a relative. No language interpreter was used.  Abdominal Pain   Associated symptoms include diarrhea, nausea and vomiting. Pertinent negatives include fever, dysuria, frequency and headaches.    Past Medical History:  Diagnosis Date  . Anemia   . Anxiety   . Barrett's esophagus   . Bilateral carpal tunnel syndrome    Dr. Eddie Dibbles, having injection therapy  . C. difficile diarrhea 08/01/2012   severe 2014  . Chronic cystitis    Dr. Matilde Sprang  . Chronic lower back pain   . Depression   . DJD (degenerative joint disease)    bilateral hands; knees  . Family history of anesthesia complication    Mother had severe N/V  . GERD (gastroesophageal reflux disease)   . H/O cardiac catheterization    (-) cath 12-2002  , cath again 2011 (-)  . HTN  (hypertension)   . Hyperlipidemia   . Insomnia   . Migraine   . Osteoporosis    pt unsure of this  . RLS (restless legs syndrome)   . Vitamin B 12 deficiency 04/09/2013    Patient Active Problem List   Diagnosis Date Noted  . Pancreatitis 10/23/2016  . Morbid obesity ([place]) 12/16/2015  . B12 deficiency 07/10/2015  . Myalgia 07/10/2015  . Hyperlipidemia 07/10/2015  . PCP NOTES >>>>>>>>>>>>>>>>> 10/31/2014  . Depression--insomnia 09/10/2013  . Vitamin B 12 deficiency 04/09/2013  . Chronic cystitis 05/29/2012  . Annual physical exam 06/14/2011  . CONSTIPATION 04/01/2009  . PARESTHESIA 04/01/2009  . DJD (degenerative joint disease) 10/21/2008  . BARRETTS ESOPHAGUS 07/19/2007  . PERSISTENT DISORDER INITIATING/MAINTAINING SLEEP 05/11/2007  . RESTLESS LEGS SYNDROME 05/11/2007  . Dyslipidemia 07/27/2006  . Migraine-- on topamax, rx [facility] 07/27/2006  . HTN (hypertension) 07/27/2006  . Osteopenia 07/27/2006  . Fatigue 07/27/2006    Past Surgical History:  Procedure Laterality Date  . Arm surgery Left    "don't remember what they did; arm wasn't broken"  . BILATERAL KNEE ARTHROSCOPY Bilateral   . CARDIAC CATHETERIZATION  01/22/10   clean cath  . CATARACT EXTRACTION W/ INTRAOCULAR LENS  IMPLANT, BILATERAL Bilateral   . COLONOSCOPY W/ POLYPECTOMY    . FOOT SURGERY Bilateral    toenails removed; callus removed on right; hammertoes right"  . Knot     "removed from right neck; not a goiter"  . LUMBAR DISC SURGERY  L5 S1 anterior fusion  . OOPHORECTOMY    . SHOULDER ARTHROSCOPY Right   . TOTAL KNEE ARTHROPLASTY  10/05/2011   Procedure: TOTAL KNEE ARTHROPLASTY;  Surgeon: Hessie Dibble, MD;  Location: [facility];  Service: Orthopedics;  Laterality: Right;  [name] VAGINAL HYSTERECTOMY     for endometriosis    OB History    No data available       Home Medications    Prior to Admission medications   Medication Sig Start Date End Date Taking? Authorizing Provider    aspirin EC 81 MG tablet Take 81 mg by mouth daily.   Yes [provider]  benazepril (LOTENSIN) 20 MG tablet Take 1 tablet (20 mg total) by mouth daily. 06/28/16  Yes Paz, Alda Berthold, MD  clonazePAM (KLONOPIN) 0.5 MG tablet Take 1 tablet (0.5 mg total) by mouth 3 (three) times daily as needed for anxiety. 08/27/14  Yes Paz, Alda Berthold, MD  dicyclomine (BENTYL) 10 MG capsule Take 1 capsule (10 mg total) by mouth 4 (four) times daily -  before meals and at bedtime. 09/29/16  Yes Ladene Artist, MD  HYDROcodone-acetaminophen (NORCO) 10-325 MG tablet Take 1 tablet by mouth every 4 (four) hours as needed. For back pain 07/10/15  Yes Paz, Alda Berthold, MD  mirabegron ER (MYRBETRIQ) 25 MG TB24 tablet Take 25 mg by mouth daily.   Yes [provider]  omeprazole (PRILOSEC) 40 MG capsule Take 1 capsule (40 mg total) by mouth 2 (two) times daily. 08/03/16  Yes Ladene Artist, MD  ranitidine (ZANTAC) 300 MG tablet Take 1 tablet (300 mg total) by mouth at bedtime. 06/14/16  Yes Paz, Alda Berthold, MD  rOPINIRole (REQUIP) 1 MG tablet TAKE 1 TABLET (1 MG TOTAL) BY MOUTH 2 (TWO) TIMES DAILY. 06/03/16  Yes Parrett, Tammy S, NP  traZODone (DESYREL) 50 MG tablet Take 1-2 tablets (50-100 mg total) by mouth at bedtime. 08/03/16  Yes Colon Branch, MD  venlafaxine XR (EFFEXOR-XR) 75 MG 24 hr capsule Take 3 capsules (225 mg total) by mouth daily with breakfast. 05/05/16  Yes Paz, Alda Berthold, MD  ranitidine (ZANTAC) 300 MG tablet TAKE 1 TABLET BY MOUTH AT BEDTIME *NEED OFFICE VISIT FOR FURTHER REFILLS Patient not taking: Reported on 10/22/2016 08/09/16   Ladene Artist, MD    Family History Family History  Problem Relation Age of Onset  . Lung cancer Mother   . Dementia Father   . CAD Father        dx in his 74s  . Stroke Father   . Colon cancer Maternal Grandfather   . Esophageal cancer Brother   . Breast cancer Sister   . Diabetes Other        Aunt  . Colon cancer Maternal Uncle        2  . Rectal cancer Neg Hx   .  Stomach cancer Neg Hx     Social History Social History  Substance Use Topics  . Smoking status: Former Smoker    Packs/day: 0.50    Years: 10.00    Quit date: 02/09/1975  . Smokeless tobacco: Never Used     Comment: started at age 57.   quit in the 1970s.  . Alcohol use 0.0 oz/week     Comment: 10/05/2011 "socially;  wine or mixed drink at the most once/month"     Allergies   Dextromethorphan-guaifenesin; Morphine; and Penicillins   Review of Systems Review of Systems  Constitutional: Negative for chills  and fever.  HENT: Negative for congestion and sore throat.   Eyes: Negative for visual disturbance.  Respiratory: Negative for cough, shortness of breath and wheezing.   Cardiovascular: Negative for chest pain.  Gastrointestinal: Positive for abdominal pain, blood in stool, diarrhea, nausea and vomiting.  Genitourinary: Negative for difficulty urinating, dysuria, frequency and urgency.  Musculoskeletal: Negative for back pain and neck pain.  Skin: Negative for rash.  Neurological: Negative for syncope, light-headedness and headaches.     Physical Exam Updated Vital Signs BP (!) 150/68 (BP Location: Right Arm)   Pulse 98   Temp 98.5 F (36.9 C) (Oral)   Resp 17   Ht 5\' 6"  (1.676 m)   Wt 114.1 kg (251 lb 8 oz)   SpO2 97%   BMI 40.59 kg/m   Physical Exam  Constitutional: She appears well-developed and well-nourished. No distress.  Nontoxic appearing. Obese female.  HENT:  Head: Normocephalic and atraumatic.  Mouth/Throat: Oropharynx is clear and moist.  Eyes: Pupils are equal, round, and reactive to light. Conjunctivae are normal. Right eye exhibits no discharge. Left eye exhibits no discharge.  Neck: Neck supple.  Cardiovascular: Normal rate, regular rhythm, normal heart sounds and intact distal pulses.  Exam reveals no gallop and no friction rub.   No murmur heard. Pulmonary/Chest: Effort normal and breath sounds normal. No respiratory distress. She has no  wheezes. She has no rales.  Lungs are clear to ascultation bilaterally. Symmetric chest expansion bilaterally. No increased work of breathing. No rales or rhonchi.    Abdominal: Soft. Bowel sounds are normal. She exhibits no distension and no mass. There is tenderness. There is no rebound and no guarding.  Abdomen is soft. Bowel sounds are present. Moderate right upper quadrant tenderness to palpation and mild generalized abdominal tenderness to palpation.   Genitourinary:  Genitourinary Comments: Digital rectal exam with female RN as chaperone. Anal sphincter tone is normal. Brownish green stool on exam. No gross bloody stool or melena.   Musculoskeletal: She exhibits no edema.  Lymphadenopathy:    She has no cervical adenopathy.  Neurological: She is alert. Coordination normal.  Skin: Skin is warm and dry. No rash noted. She is not diaphoretic. No erythema. No pallor.  Psychiatric: She has a normal mood and affect. Her behavior is normal.  Nursing note and vitals reviewed.    ED Treatments / Results  Labs (all labs ordered are listed, but only abnormal results are displayed) Labs Reviewed  COMPREHENSIVE METABOLIC PANEL - Abnormal; Notable for the following:       Result Value   Glucose, Bld 110 (*)    Creatinine, Ser 1.01 (*)    Calcium 8.6 (*)    AST 380 (*)    ALT 239 (*)    GFR calc non Af Amer 54 (*)    All other components within normal limits  CBC - Abnormal; Notable for the following:    WBC 11.0 (*)    All other components within normal limits  LIPASE, BLOOD - Abnormal; Notable for the following:    Lipase 835 (*)    All other components within normal limits  GASTROINTESTINAL PANEL BY PCR, STOOL (REPLACES STOOL CULTURE)  URINALYSIS, ROUTINE W REFLEX MICROSCOPIC  CBC  COMPREHENSIVE METABOLIC PANEL  POC OCCULT BLOOD, ED  TYPE AND SCREEN    EKG  EKG Interpretation None       Radiology Ct Abdomen Pelvis W Contrast  Result Date: 10/23/2016 CLINICAL DATA:   Abdominal pain and emesis. Elevated  liver enzymes and lipase. EXAM: CT ABDOMEN AND PELVIS WITH CONTRAST TECHNIQUE: Multidetector CT imaging of the abdomen and pelvis was performed using the standard protocol following bolus administration of intravenous contrast. CONTRAST:  100 mL ISOVUE-300 IOPAMIDOL (ISOVUE-300) INJECTION 61% COMPARISON:  April 05, 2007 FINDINGS: Lower chest: There is left base atelectasis. Lung bases otherwise clear. Hepatobiliary: There are no focal liver lesions appreciable. There is evidence suggesting a degree of pericholecystic fluid. The gallbladder wall does not appear appreciably thickened, however. There is no biliary duct dilatation. There is inflammatory change involving a portion of the posterior head and uncinate process of the pancreas with mild soft tissue stranding in this area and immediately adjacent to the pancreas. Pancreas elsewhere appears normal. There is no well-defined fluid or pseudocyst in the pancreas. There is no pancreatic mass or pancreatic duct dilatation. Spleen: No splenic lesions are evident. Adrenals/Urinary Tract: Adrenals appear unremarkable bilaterally. Kidneys bilaterally show no evident mass or hydronephrosis on either side. There is no appreciable renal or ureteral calculus on either side. Urinary bladder is midline with wall thickness within normal limits. Stomach/Bowel: There is localized wall thickness at the junction of the second and third portions of duodenum, immediately adjacent to the apparent pancreatic inflammation. No other bowel wall thickening is noted. No bowel obstruction. No free air or portal venous air. Vascular/Lymphatic: There are foci of atherosclerotic calcification in the aorta. There is no abdominal aortic aneurysm. Major mesenteric vessels appear patent. There is no appreciable adenopathy in the abdomen or pelvis. Reproductive: Uterus is absent.  No pelvic mass. Other: Appendix is not appreciable. There is no periappendiceal  region inflammation. No abscess or ascites is apparent in the abdomen or pelvis. There is a minimal ventral hernia containing only fat. Musculoskeletal: There is a disc spacer at L5-S1 causing susceptibility artifact. There is degenerative change in the lumbar spine. There are no blastic or lytic bone lesions. There are no intramuscular or abdominal wall lesions. IMPRESSION: 1. Suspect a degree of pericholecystic fluid. Gallbladder wall does not appear appreciably thickened, however. Advise correlation with gallbladder ultrasound to further assess. 2. Evidence of pancreatitis involving portions of the head and uncinate process. There is localized inflammatory change with soft tissue stranding in this portion of the pancreatitis and immediately adjacent structures. Remainder the pancreas appears normal. No mass or pseudocyst. No pancreatic duct dilatation. No fluid seen in the peripancreatic area. 3. Wall thickening at the junction of the second and third portions of the duodenum, likely duodenitis secondary to adjacent pancreatitis. No other bowel wall thickening. No bowel obstruction. 4.  No abscess in the abdomen or pelvis. 5. No renal or ureteral calculus. No hydronephrosis on either side. 6.  Aortic atherosclerosis. 7.  Minimal ventral hernia containing only fat. 8.  Artifact from metallic disc spacer at I4-P8. Aortic Atherosclerosis (ICD10-I70.0). Electronically Signed   By: Lowella Grip III M.D.   On: 10/23/2016 01:16   US Abdomen Limited  Result Date: 10/22/2016 CLINICAL DATA:  Initial evaluation regarding fracture pain, elevated liver enzymes. EXAM: ULTRASOUND ABDOMEN LIMITED RIGHT UPPER QUADRANT COMPARISON:  Prior ultrasound from 10/07/2016. FINDINGS: Gallbladder: Scattered echogenic calculi present within the gallbladder lumen, largest measuring 5 mm. Gallbladder wall measured within normal limits at 2.1 mm. No free pericholecystic fluid. No sonographic Murphy sign elicited on exam. Common bile  duct: Diameter: 4.1 mm Liver: No focal lesion identified. Within normal limits in parenchymal echogenicity. Portal vein is patent on color Doppler imaging with normal direction of blood flow towards the liver.  IMPRESSION: 1. Cholelithiasis without additional sonographic features to suggest acute cholecystitis. 2. No biliary dilatation. Electronically Signed   By: Jeannine Boga M.D.   On: 10/22/2016 23:48    Procedures Procedures (including critical care time)  Medications Ordered in ED Medications  fentaNYL (SUBLIMAZE) injection 25-50 mcg (50 mcg Intravenous Given 10/23/16 0339)  enoxaparin (LOVENOX) injection 40 mg (not administered)  0.9 %  sodium chloride infusion ( Intravenous New Bag/Given 10/23/16 0339)  ondansetron (ZOFRAN) tablet 4 mg (not administered)    Or  ondansetron (ZOFRAN) injection 4 mg (not administered)  sodium chloride 0.9 % bolus 1,000 mL (0 mLs Intravenous Stopped 10/23/16 0100)  ondansetron (ZOFRAN) injection 4 mg (4 mg Intravenous Given 10/22/16 2302)  iopamidol (ISOVUE-300) 61 % injection (100 mLs  Contrast Given 10/23/16 0036)  fentaNYL (SUBLIMAZE) injection 50 mcg (50 mcg Intravenous Given 10/23/16 0007)  sodium chloride 0.9 % bolus 1,000 mL (1,000 mLs Intravenous New Bag/Given 10/23/16 0153)  fentaNYL (SUBLIMAZE) injection 50 mcg (50 mcg Intravenous Given 10/23/16 0150)     Initial Impression / Assessment and Plan / ED Course  I have reviewed the triage vital signs and the nursing notes.  Pertinent labs & imaging results that were available during my care of the patient were reviewed by me and considered in my medical decision making (see chart for details).     This is a 71 y.o. Female who presents to the ED complaining of right upper quadrant abdominal pain, nausea, vomiting and diarrhea beginning today. Patient reports having mild right upper quadrant pain yesterday that has worsened today. She reports having 7 episodes of vomiting and about 7 episodes of  diarrhea today. She reports loose black stool. No hematemesis. She reports taking a daily baby aspirin. No NSAID use or anticoagulation use. No treatments attempted prior to her arrival today. She is not using Pepto-Bismol. Previous abdominal surgical history includes a hysterectomy. She reports lots of problems with acid reflux chronically and takes several medications for this. No history of previous GI bleed. She denies alcohol use. On exam the patient is afebrile nontoxic appearing. She has epigastric and right upper quadrant abdominal tenderness to palpation. Digital rectal exam reveals brownish greenish stool. Guaiac negative.  Based on blood work ordered in triage patient's AST and ALT are elevated at 380 and 239 respectively. CBC also reveals a leukocytosis with a white count of 11,000. Will order a right upper quadrant ultrasound, and a lipase.  Right upper quadrant ultrasound reveals cholelithiasis without evidence for cholecystitis. No biliary dilation.  Lipase returns elevated at 835. We'll obtain a CT abdomen and pelvis with contrast. Patient family agree with plan.  CT abdomen and pelvis with contrast shows evidence of pancreatitis. No mass or pseudocyst. No abscess.  At reevaluation patient is feeling better after fluids and pain and nausea medication. She started to have some return of pain. Will provide with more fentanyl. Plan for admission. Patient family agree with plan for admission.  I consulted with hospitalist Dr. Tamala Julian who accepted the patient for admission.  This patient was discussed with and evaluated by Dr. Cathleen Fears who agrees with assessment and plan.   Final Clinical Impressions(s) / ED Diagnoses   Final diagnoses:  Idiopathic acute pancreatitis, unspecified complication status  Elevated liver enzymes  Nausea vomiting and diarrhea    New Prescriptions Current Discharge Medication List       Waynetta Pean, Hershal Coria 10/23/16 [place], [place],  MD 10/23/16 [phone]

## 2016-10-22 NOTE — ED Triage Notes (Signed)
Pt reports abdominal pain, vomiting and diarrhea that began today, reports stools are black in color, no blood thinner. Pt a/ox4, resp e/u, nad.

## 2016-10-23 ENCOUNTER — Emergency Department (HOSPITAL_COMMUNITY): Payer: Medicare Other

## 2016-10-23 ENCOUNTER — Encounter (HOSPITAL_COMMUNITY): Payer: Self-pay | Admitting: Radiology

## 2016-10-23 DIAGNOSIS — Z96651 Presence of right artificial knee joint: Secondary | ICD-10-CM | POA: Diagnosis not present

## 2016-10-23 DIAGNOSIS — K8500 Idiopathic acute pancreatitis without necrosis or infection: Secondary | ICD-10-CM

## 2016-10-23 DIAGNOSIS — R748 Abnormal levels of other serum enzymes: Secondary | ICD-10-CM

## 2016-10-23 DIAGNOSIS — E876 Hypokalemia: Secondary | ICD-10-CM | POA: Diagnosis present

## 2016-10-23 DIAGNOSIS — K219 Gastro-esophageal reflux disease without esophagitis: Secondary | ICD-10-CM | POA: Diagnosis not present

## 2016-10-23 DIAGNOSIS — R945 Abnormal results of liver function studies: Secondary | ICD-10-CM | POA: Diagnosis not present

## 2016-10-23 DIAGNOSIS — K2270 Barrett's esophagus without dysplasia: Secondary | ICD-10-CM | POA: Diagnosis present

## 2016-10-23 DIAGNOSIS — Z823 Family history of stroke: Secondary | ICD-10-CM | POA: Diagnosis not present

## 2016-10-23 DIAGNOSIS — I10 Essential (primary) hypertension: Secondary | ICD-10-CM | POA: Diagnosis not present

## 2016-10-23 DIAGNOSIS — D72829 Elevated white blood cell count, unspecified: Secondary | ICD-10-CM

## 2016-10-23 DIAGNOSIS — K851 Biliary acute pancreatitis without necrosis or infection: Principal | ICD-10-CM

## 2016-10-23 DIAGNOSIS — G8929 Other chronic pain: Secondary | ICD-10-CM | POA: Diagnosis present

## 2016-10-23 DIAGNOSIS — R112 Nausea with vomiting, unspecified: Secondary | ICD-10-CM | POA: Diagnosis not present

## 2016-10-23 DIAGNOSIS — Z87891 Personal history of nicotine dependence: Secondary | ICD-10-CM | POA: Diagnosis not present

## 2016-10-23 DIAGNOSIS — Z833 Family history of diabetes mellitus: Secondary | ICD-10-CM | POA: Diagnosis not present

## 2016-10-23 DIAGNOSIS — G2581 Restless legs syndrome: Secondary | ICD-10-CM | POA: Diagnosis not present

## 2016-10-23 DIAGNOSIS — F419 Anxiety disorder, unspecified: Secondary | ICD-10-CM | POA: Diagnosis not present

## 2016-10-23 DIAGNOSIS — K859 Acute pancreatitis without necrosis or infection, unspecified: Secondary | ICD-10-CM | POA: Diagnosis present

## 2016-10-23 DIAGNOSIS — Z8249 Family history of ischemic heart disease and other diseases of the circulatory system: Secondary | ICD-10-CM | POA: Diagnosis not present

## 2016-10-23 DIAGNOSIS — K8020 Calculus of gallbladder without cholecystitis without obstruction: Secondary | ICD-10-CM | POA: Diagnosis not present

## 2016-10-23 DIAGNOSIS — R1011 Right upper quadrant pain: Secondary | ICD-10-CM | POA: Diagnosis not present

## 2016-10-23 DIAGNOSIS — K5900 Constipation, unspecified: Secondary | ICD-10-CM | POA: Diagnosis not present

## 2016-10-23 DIAGNOSIS — G4700 Insomnia, unspecified: Secondary | ICD-10-CM | POA: Diagnosis present

## 2016-10-23 DIAGNOSIS — Z7982 Long term (current) use of aspirin: Secondary | ICD-10-CM | POA: Diagnosis not present

## 2016-10-23 DIAGNOSIS — Z90710 Acquired absence of both cervix and uterus: Secondary | ICD-10-CM | POA: Diagnosis not present

## 2016-10-23 DIAGNOSIS — E785 Hyperlipidemia, unspecified: Secondary | ICD-10-CM | POA: Diagnosis not present

## 2016-10-23 DIAGNOSIS — K8010 Calculus of gallbladder with chronic cholecystitis without obstruction: Secondary | ICD-10-CM | POA: Diagnosis not present

## 2016-10-23 DIAGNOSIS — Z6841 Body Mass Index (BMI) 40.0 and over, adult: Secondary | ICD-10-CM | POA: Diagnosis not present

## 2016-10-23 DIAGNOSIS — F329 Major depressive disorder, single episode, unspecified: Secondary | ICD-10-CM | POA: Diagnosis not present

## 2016-10-23 DIAGNOSIS — F5104 Psychophysiologic insomnia: Secondary | ICD-10-CM | POA: Diagnosis present

## 2016-10-23 LAB — URINALYSIS, ROUTINE W REFLEX MICROSCOPIC
Bilirubin Urine: NEGATIVE
Glucose, UA: NEGATIVE mg/dL
Hgb urine dipstick: NEGATIVE
Ketones, ur: NEGATIVE mg/dL
Leukocytes, UA: NEGATIVE
Nitrite: NEGATIVE
Protein, ur: NEGATIVE mg/dL
Specific Gravity, Urine: 1.04 — ABNORMAL HIGH (ref 1.005–1.030)
pH: 5 (ref 5.0–8.0)

## 2016-10-23 LAB — COMPREHENSIVE METABOLIC PANEL
ALT: 159 U/L — ABNORMAL HIGH (ref 14–54)
AST: 152 U/L — ABNORMAL HIGH (ref 15–41)
Albumin: 2.9 g/dL — ABNORMAL LOW (ref 3.5–5.0)
Alkaline Phosphatase: 87 U/L (ref 38–126)
Anion gap: 4 — ABNORMAL LOW (ref 5–15)
BUN: 15 mg/dL (ref 6–20)
CO2: 23 mmol/L (ref 22–32)
Calcium: 7.6 mg/dL — ABNORMAL LOW (ref 8.9–10.3)
Chloride: 111 mmol/L (ref 101–111)
Creatinine, Ser: 0.92 mg/dL (ref 0.44–1.00)
GFR calc Af Amer: >60 mL/min (ref >60)
GFR calc non Af Amer: >60 mL/min (ref >60)
Glucose, Bld: 97 mg/dL (ref 65–99)
Potassium: 3.4 mmol/L — ABNORMAL LOW (ref 3.5–5.1)
Sodium: 138 mmol/L (ref 135–145)
Total Bilirubin: 0.7 mg/dL (ref 0.3–1.2)
Total Protein: 5.6 g/dL — ABNORMAL LOW (ref 6.5–8.1)

## 2016-10-23 LAB — CBC
HCT: 32.7 % — ABNORMAL LOW (ref 36.0–46.0)
Hemoglobin: 10.1 g/dL — ABNORMAL LOW (ref 12.0–15.0)
MCH: 28.5 pg (ref 26.0–34.0)
MCHC: 30.9 g/dL (ref 30.0–36.0)
MCV: 92.4 fL (ref 78.0–100.0)
Platelets: 205 10*3/uL (ref 150–400)
RBC: 3.54 MIL/uL — ABNORMAL LOW (ref 3.87–5.11)
RDW: 14.1 % (ref 11.5–15.5)
WBC: 8 10*3/uL (ref 4.0–10.5)

## 2016-10-23 LAB — LIPASE, BLOOD: Lipase: 189 U/L — ABNORMAL HIGH (ref 11–51)

## 2016-10-23 MED ORDER — ROPINIROLE HCL 1 MG PO TABS
1.0000 mg | ORAL_TABLET | Freq: Two times a day (BID) | ORAL | Status: DC
  Administered 2016-10-23 – 2016-10-26 (×7): 1 mg via ORAL
  Filled 2016-10-23 (×7): qty 1

## 2016-10-23 MED ORDER — ENOXAPARIN SODIUM 40 MG/0.4ML ~~LOC~~ SOLN
40.0000 mg | SUBCUTANEOUS | Status: DC
  Administered 2016-10-23: 40 mg via SUBCUTANEOUS
  Filled 2016-10-23: qty 0.4

## 2016-10-23 MED ORDER — ONDANSETRON HCL 4 MG/2ML IJ SOLN
4.0000 mg | Freq: Four times a day (QID) | INTRAMUSCULAR | Status: DC | PRN
  Administered 2016-10-23 – 2016-10-24 (×2): 4 mg via INTRAVENOUS
  Filled 2016-10-23 (×3): qty 2

## 2016-10-23 MED ORDER — PANTOPRAZOLE SODIUM 40 MG IV SOLR
40.0000 mg | INTRAVENOUS | Status: DC
  Administered 2016-10-23 – 2016-10-26 (×4): 40 mg via INTRAVENOUS
  Filled 2016-10-23 (×4): qty 40

## 2016-10-23 MED ORDER — TRAZODONE HCL 50 MG PO TABS
50.0000 mg | ORAL_TABLET | Freq: Every day | ORAL | Status: DC
  Administered 2016-10-23 – 2016-10-25 (×3): 50 mg via ORAL
  Filled 2016-10-23 (×3): qty 1

## 2016-10-23 MED ORDER — MIRABEGRON ER 25 MG PO TB24
25.0000 mg | ORAL_TABLET | Freq: Every day | ORAL | Status: DC
  Administered 2016-10-23 – 2016-10-26 (×4): 25 mg via ORAL
  Filled 2016-10-23 (×4): qty 1

## 2016-10-23 MED ORDER — SODIUM CHLORIDE 0.9 % IV BOLUS (SEPSIS)
1000.0000 mL | Freq: Once | INTRAVENOUS | Status: AC
  Administered 2016-10-23: 1000 mL via INTRAVENOUS

## 2016-10-23 MED ORDER — CLONAZEPAM 0.5 MG PO TABS: 0.5000 mg | ORAL_TABLET | Freq: Three times a day (TID) | ORAL | Status: DC | PRN

## 2016-10-23 MED ORDER — VENLAFAXINE HCL ER 75 MG PO CP24
225.0000 mg | ORAL_CAPSULE | Freq: Every day | ORAL | Status: DC
  Administered 2016-10-24 – 2016-10-26 (×3): 225 mg via ORAL
  Filled 2016-10-23 (×4): qty 3

## 2016-10-23 MED ORDER — FENTANYL CITRATE (PF) 100 MCG/2ML IJ SOLN
50.0000 ug | Freq: Once | INTRAMUSCULAR | Status: AC
  Administered 2016-10-23: 50 ug via INTRAVENOUS
  Filled 2016-10-23: qty 2

## 2016-10-23 MED ORDER — ONDANSETRON HCL 4 MG PO TABS: 4.0000 mg | ORAL_TABLET | Freq: Four times a day (QID) | ORAL | Status: DC | PRN

## 2016-10-23 MED ORDER — SODIUM CHLORIDE 0.9 % IV SOLN
INTRAVENOUS | Status: DC
  Administered 2016-10-23 – 2016-10-25 (×7): via INTRAVENOUS

## 2016-10-23 MED ORDER — FENTANYL CITRATE (PF) 100 MCG/2ML IJ SOLN
25.0000 ug | INTRAMUSCULAR | Status: DC | PRN
  Administered 2016-10-23 – 2016-10-24 (×5): 50 ug via INTRAVENOUS
  Filled 2016-10-23 (×6): qty 2

## 2016-10-23 NOTE — H&P (Signed)
History and Physical    [name] TOI:[id number] DOB: Sep 14, 1945 DOA: 10/22/2016  Referring MD/NP/PA: Waynetta Pean, PA-C PCP: Colon Branch, MD  Patient coming from: Home  Chief Complaint: Abdominal pain with nausea and vomiting  HPI: [name] is a 71 y.o. female with medical history significant of HTN, HLD, Barrett's esophagus, C. difficile, depression, GERD; presents with complaints of abdominal pain with nausea and vomiting. Patient reports that she's had intermittent episodes of abdominal pain starting in July 2018. Symptoms are described as sharp and stabbing in nature and can last all day. Nothing helps relieve symptoms, except for time. She was being evaluated by Dr. Fuller Plan of gastroenterology in the outpatient setting. Last night when symptoms occurred she had 7-8 episodes of nonbloody emesis with reports of loose dark black stools. Associated symptoms included chills, diaphoresis, and shortness of breath due to the pain. Denies any dysuria, chest pain, palpitations. She reports occasional glass of alcohol 2-3 times a year.  ED Course: Upon admission into the emergency department patient was in vital signs relatively within normal limits. Labs revealed WBC 11,BUN 16, Cr 1.01,  lipase 835, AST 380, ALT 239. Stool guaiacs were negative for fecal occult blood. CT scan revealed signs of pancreatitis with duodenitis. Patient was given 2 L of IV fluids, Zofran, and multiple doses of fentanyl for pain control.  Review of Systems  Constitutional: Positive for chills and diaphoresis. Negative for weight loss.  HENT: Negative for ear discharge and nosebleeds.   Eyes: Negative for photophobia and pain.  Respiratory: Positive for shortness of breath. Negative for cough and sputum production.   Cardiovascular: Negative for chest pain, palpitations and leg swelling.  Gastrointestinal: Positive for abdominal pain, diarrhea, nausea and vomiting.  Genitourinary: Negative for dysuria and  frequency.  Musculoskeletal: Positive for joint pain. Negative for falls.  Skin: Negative for itching and rash.  Neurological: Positive for sensory change. Negative for focal weakness.  Endo/Heme/Allergies: Negative for environmental allergies and polydipsia.  Psychiatric/Behavioral: Negative for substance abuse. The patient is nervous/anxious.     Past Medical History:  Diagnosis Date  . Anemia   . Anxiety   . Barrett's esophagus   . Bilateral carpal tunnel syndrome    Dr. Eddie Dibbles, having injection therapy  . C. difficile diarrhea 08/01/2012   severe 2014  . Chronic cystitis    Dr. Matilde Sprang  . Chronic lower back pain   . Depression   . DJD (degenerative joint disease)    bilateral hands; knees  . Family history of anesthesia complication    Mother had severe N/V  . GERD (gastroesophageal reflux disease)   . H/O cardiac catheterization    (-) cath 12-2002  , cath again 2011 (-)  . HTN (hypertension)   . Hyperlipidemia   . Insomnia   . Migraine   . Osteoporosis    pt unsure of this  . RLS (restless legs syndrome)   . Vitamin B 12 deficiency 04/09/2013    Past Surgical History:  Procedure Laterality Date  . Arm surgery Left    "don't remember what they did; arm wasn't broken"  . BILATERAL KNEE ARTHROSCOPY Bilateral   . CARDIAC CATHETERIZATION  01/22/10   clean cath  . CATARACT EXTRACTION W/ INTRAOCULAR LENS  IMPLANT, BILATERAL Bilateral   . COLONOSCOPY W/ POLYPECTOMY    . FOOT SURGERY Bilateral    toenails removed; callus removed on right; hammertoes right"  . Knot     "removed from right neck; not a goiter"  .  LUMBAR DISC SURGERY     L5 S1 anterior fusion  . OOPHORECTOMY    . SHOULDER ARTHROSCOPY Right   . TOTAL KNEE ARTHROPLASTY  10/05/2011   Procedure: TOTAL KNEE ARTHROPLASTY;  Surgeon: Hessie Dibble, MD;  Location: [place];  Service: Orthopedics;  Laterality: Right;  [name] VAGINAL HYSTERECTOMY     for endometriosis     reports that she quit smoking about 41  years ago. She has a 5.00 pack-year smoking history. She has never used smokeless tobacco. She reports that she drinks alcohol. She reports that she does not use drugs.  Allergies  Allergen Reactions  . Dextromethorphan-Guaifenesin Nausea And Vomiting  . Morphine Nausea And Vomiting  . Penicillins Rash    "> 30 years ago; best I can remember it was just a light rash on my arm"    Family History  Problem Relation Age of Onset  . Lung cancer Mother   . Dementia Father   . CAD Father        dx in his 62s  . Stroke Father   . Colon cancer Maternal Grandfather   . Esophageal cancer Brother   . Breast cancer Sister   . Diabetes Other        Aunt  . Colon cancer Maternal Uncle        2  . Rectal cancer Neg Hx   . Stomach cancer Neg Hx     Prior to Admission medications   Medication Sig Start Date End Date Taking? Authorizing Provider  aspirin EC 81 MG tablet Take 81 mg by mouth daily.   Yes [provider]  benazepril (LOTENSIN) 20 MG tablet Take 1 tablet (20 mg total) by mouth daily. 06/28/16  Yes Paz, Alda Berthold, MD  clonazePAM (KLONOPIN) 0.5 MG tablet Take 1 tablet (0.5 mg total) by mouth 3 (three) times daily as needed for anxiety. 08/27/14  Yes Paz, Alda Berthold, MD  dicyclomine (BENTYL) 10 MG capsule Take 1 capsule (10 mg total) by mouth 4 (four) times daily -  before meals and at bedtime. 09/29/16  Yes Ladene Artist, MD  HYDROcodone-acetaminophen (NORCO) 10-325 MG tablet Take 1 tablet by mouth every 4 (four) hours as needed. For back pain 07/10/15  Yes Paz, Alda Berthold, MD  mirabegron ER (MYRBETRIQ) 25 MG TB24 tablet Take 25 mg by mouth daily.   Yes [provider]  omeprazole (PRILOSEC) 40 MG capsule Take 1 capsule (40 mg total) by mouth 2 (two) times daily. 08/03/16  Yes Ladene Artist, MD  ranitidine (ZANTAC) 300 MG tablet Take 1 tablet (300 mg total) by mouth at bedtime. 06/14/16  Yes Paz, Alda Berthold, MD  rOPINIRole (REQUIP) 1 MG tablet TAKE 1 TABLET (1 MG TOTAL) BY MOUTH 2  (TWO) TIMES DAILY. 06/03/16  Yes Parrett, Tammy S, NP  traZODone (DESYREL) 50 MG tablet Take 1-2 tablets (50-100 mg total) by mouth at bedtime. 08/03/16  Yes Colon Branch, MD  venlafaxine XR (EFFEXOR-XR) 75 MG 24 hr capsule Take 3 capsules (225 mg total) by mouth daily with breakfast. 05/05/16  Yes Colon Branch, MD  ranitidine (ZANTAC) 300 MG tablet TAKE 1 TABLET BY MOUTH AT BEDTIME *NEED OFFICE VISIT FOR FURTHER REFILLS Patient not taking: Reported on 10/22/2016 08/09/16   Ladene Artist, MD    Physical Exam:  Constitutional: Obese female in moderate discomfort  Vitals:   10/22/16 2300 10/22/16 2345 10/23/16 0000 10/23/16 0015  BP: (!) 145/73 (!) 149/64 (!) 146/80 (!) 147/83  Pulse: 81 82 93 96  Resp: 17 19 20 19   Temp:      TempSrc:      SpO2: 98% 98% 99% 95%   Eyes: PERRL, lids and conjunctivae normal ENMT: Mucous membranes are dry. Posterior pharynx clear of any exudate or lesions. Neck: normal, supple, no masses, no thyromegaly Respiratory: clear to auscultation bilaterally, no wheezing, no crackles. Normal respiratory effort. No accessory muscle use.  Cardiovascular: Regular rate and rhythm, no murmurs / rubs / gallops. No extremity edema. 2+ pedal pulses. No carotid bruits.  Abdomen: epigastric tendernessTo palpation, no masses palpated. No hepatosplenomegaly. Bowel sounds positive.  Musculoskeletal: no clubbing / cyanosis. No joint deformity upper and lower extremities. Good ROM, no contractures. Normal muscle tone.  Skin: no rashes, lesions, ulcers. No induration Neurologic: CN 2-12 grossly intact. Sensation intact, DTR normal. Strength 5/5 in all 4.  Psychiatric: Normal judgment and insight. Alert and oriented x 3. Normal mood.     Labs on Admission: I have personally reviewed following labs and imaging studies  CBC:  Recent Labs Lab 10/22/16 1800  WBC 11.0*  HGB 12.2  HCT 38.1  MCV 92.5  PLT 952   Basic Metabolic Panel:  Recent Labs Lab 10/22/16 1800  NA 138  K  4.1  CL 107  CO2 25  GLUCOSE 110*  BUN 16  CREATININE 1.01*  CALCIUM 8.6*   GFR: CrCl cannot be calculated (Unknown ideal weight.). Liver Function Tests:  Recent Labs Lab 10/22/16 1800  AST 380*  ALT 239*  ALKPHOS 116  BILITOT 0.8  PROT 7.2  ALBUMIN 3.6    Recent Labs Lab 10/22/16 1800  LIPASE 835*   No results for input(s): AMMONIA in the last 168 hours. Coagulation Profile: No results for input(s): INR, PROTIME in the last 168 hours. Cardiac Enzymes: No results for input(s): CKTOTAL, CKMB, CKMBINDEX, TROPONINI in the last 168 hours. BNP (last 3 results) No results for input(s): PROBNP in the last 8760 hours. HbA1C: No results for input(s): HGBA1C in the last 72 hours. CBG: No results for input(s): GLUCAP in the last 168 hours. Lipid Profile: No results for input(s): CHOL, HDL, LDLCALC, TRIG, CHOLHDL, LDLDIRECT in the last 72 hours. Thyroid Function Tests: No results for input(s): TSH, T4TOTAL, FREET4, T3FREE, THYROIDAB in the last 72 hours. Anemia Panel: No results for input(s): VITAMINB12, FOLATE, FERRITIN, TIBC, IRON, RETICCTPCT in the last 72 hours. Urine analysis:    Component Value Date/Time   COLORURINE Orange (A) 07/12/2014 1310   APPEARANCEUR CLEAR 07/12/2014 1310   LABSPEC 1.010 07/12/2014 1310   PHURINE 6.5 07/12/2014 1310   GLUCOSEU NEGATIVE 07/12/2014 1310   HGBUR NEGATIVE 07/12/2014 1310   HGBUR negative 12/11/2007 1029   BILIRUBINUR NEGATIVE 07/12/2014 1310   BILIRUBINUR Negative 07/05/2014 1542   KETONESUR TRACE (A) 07/12/2014 1310   PROTEINUR Negative 07/05/2014 1542   PROTEINUR NEG 12/15/2012 1652   UROBILINOGEN >=8.0 (A) 07/12/2014 1310   NITRITE POSITIVE (A) 07/12/2014 1310   LEUKOCYTESUR SMALL (A) 07/12/2014 1310   Sepsis Labs: No results found for this or any previous visit (from the past 240 hour(s)).   Radiological Exams on Admission: Ct Abdomen Pelvis W Contrast  Result Date: 10/23/2016 CLINICAL DATA:  Abdominal pain and  emesis. Elevated liver enzymes and lipase. EXAM: CT ABDOMEN AND PELVIS WITH CONTRAST TECHNIQUE: Multidetector CT imaging of the abdomen and pelvis was performed using the standard protocol following bolus administration of intravenous contrast. CONTRAST:  100 mL ISOVUE-300 IOPAMIDOL (ISOVUE-300) INJECTION 61% COMPARISON:  April 05, 2007 FINDINGS: Lower chest: There is left base atelectasis. Lung bases otherwise clear. Hepatobiliary: There are no focal liver lesions appreciable. There is evidence suggesting a degree of pericholecystic fluid. The gallbladder wall does not appear appreciably thickened, however. There is no biliary duct dilatation. There is inflammatory change involving a portion of the posterior head and uncinate process of the pancreas with mild soft tissue stranding in this area and immediately adjacent to the pancreas. Pancreas elsewhere appears normal. There is no well-defined fluid or pseudocyst in the pancreas. There is no pancreatic mass or pancreatic duct dilatation. Spleen: No splenic lesions are evident. Adrenals/Urinary Tract: Adrenals appear unremarkable bilaterally. Kidneys bilaterally show no evident mass or hydronephrosis on either side. There is no appreciable renal or ureteral calculus on either side. Urinary bladder is midline with wall thickness within normal limits. Stomach/Bowel: There is localized wall thickness at the junction of the second and third portions of duodenum, immediately adjacent to the apparent pancreatic inflammation. No other bowel wall thickening is noted. No bowel obstruction. No free air or portal venous air. Vascular/Lymphatic: There are foci of atherosclerotic calcification in the aorta. There is no abdominal aortic aneurysm. Major mesenteric vessels appear patent. There is no appreciable adenopathy in the abdomen or pelvis. Reproductive: Uterus is absent.  No pelvic mass. Other: Appendix is not appreciable. There is no periappendiceal region inflammation.  No abscess or ascites is apparent in the abdomen or pelvis. There is a minimal ventral hernia containing only fat. Musculoskeletal: There is a disc spacer at L5-S1 causing susceptibility artifact. There is degenerative change in the lumbar spine. There are no blastic or lytic bone lesions. There are no intramuscular or abdominal wall lesions. IMPRESSION: 1. Suspect a degree of pericholecystic fluid. Gallbladder wall does not appear appreciably thickened, however. Advise correlation with gallbladder ultrasound to further assess. 2. Evidence of pancreatitis involving portions of the head and uncinate process. There is localized inflammatory change with soft tissue stranding in this portion of the pancreatitis and immediately adjacent structures. Remainder the pancreas appears normal. No mass or pseudocyst. No pancreatic duct dilatation. No fluid seen in the peripancreatic area. 3. Wall thickening at the junction of the second and third portions of the duodenum, likely duodenitis secondary to adjacent pancreatitis. No other bowel wall thickening. No bowel obstruction. 4.  No abscess in the abdomen or pelvis. 5. No renal or ureteral calculus. No hydronephrosis on either side. 6.  Aortic atherosclerosis. 7.  Minimal ventral hernia containing only fat. 8.  Artifact from metallic disc spacer at V6-H6. Aortic Atherosclerosis (ICD10-I70.0). Electronically Signed   By: Lowella Grip III M.D.   On: 10/23/2016 01:16   US Abdomen Limited  Result Date: 10/22/2016 CLINICAL DATA:  Initial evaluation regarding fracture pain, elevated liver enzymes. EXAM: ULTRASOUND ABDOMEN LIMITED RIGHT UPPER QUADRANT COMPARISON:  Prior ultrasound from 10/07/2016. FINDINGS: Gallbladder: Scattered echogenic calculi present within the gallbladder lumen, largest measuring 5 mm. Gallbladder wall measured within normal limits at 2.1 mm. No free pericholecystic fluid. No sonographic Murphy sign elicited on exam. Common bile duct: Diameter: 4.1 mm  Liver: No focal lesion identified. Within normal limits in parenchymal echogenicity. Portal vein is patent on color Doppler imaging with normal direction of blood flow towards the liver. IMPRESSION: 1. Cholelithiasis without additional sonographic features to suggest acute cholecystitis. 2. No biliary dilatation. Electronically Signed   By: Jeannine Boga M.D.   On: 10/22/2016 23:48    CT abdomen/pelvis: Independently reviewed. Inflammation noted around the pancreas  Assessment/Plan  Pancreatitis: acute. Patient presents with abdominal pain with nausea, vomiting, and diarrhea. Lipase elevated at 800. CT scan showing pancreatitis without signs of abscess. Question possibility of gallbladder being cause of symptoms although no biliary duct dilatation noted. - Admit to a MedSurg bed - Advance diet as tolerated - NS IV fluids at 125 ml/hr as tolerated - Pain control with fentanyl - may warrant consult to GI in a.m.   Nausea, vomiting, and abdominal pain : secondary to above and/or cholelithiasis - Antiemetics prn  Cholelithiasis: Abdominal ultrasound showed signs of cholelithiasis without features suggestive of acute cholecystitis and no biliary dilation noted. Question need of cholecystectomy. - Patient may benefit from surgery consult as well once acute pancreatitis resolves  Leukocytosis: Acute. WBC 10.7 on admission. Suspect reactive to above.  - Follow-up urinalysis - Recheck CBC in a.m.   Anxiety - Ativan IV prn   Transaminitis: Acute. Blood work revealed AST 380, ALT 239. - Recheck CMP in a.m.  H/O Barrett's esophagus  Morbid obesity: BMI 40.6   DVT prophylaxis: Lovenox Code Status: Full  Family Communication: No family present at bedside Disposition Plan: [place] discharge home in 2-3 days Consults called: None Admission status: inpatient  Norval Morton MD [facility] Pager [phone]   If 7PM-7AM, please contact night-coverage [email or web address] Password  [email or web address]  10/23/2016, 1:51 AM

## 2016-10-23 NOTE — Progress Notes (Signed)
[facility]                                                                              Patient Demographics  [name], is a 72 y.o. female, DOB - January 14, 1946, YPP:[id number]  Admit date - 10/22/2016   Admitting Physician Norval Morton, MD  Outpatient Primary MD for the patient is Colon Branch, MD  Outpatient specialists:   LOS - 0  days   Medical records reviewed and are as summarized below:    Chief Complaint  Patient presents with  . Abdominal Pain  . GI Bleeding       Brief summary   Patient is a 37 year oldfemale with hypertension, hyperlipidemia, Barrett's esophagus, C. Difficile, depression, GERD presented with nausea, vomiting, intermittent abdominal pain since July 2018. She was being evaluated by Dr. Fuller Plan of GI in the outpatient setting. CT abdomen showed pancreatitis with cholelithiasis, no acute cholecystitis. lipase 835with elevated LFTs.    Assessment & Plan    Principal Problem:   Pancreatitis, acute with cholelithiasis - Patient presented with nausea, vomiting, abdominal pain, lipase 835 with transaminitis - CT abdomen showed pericholecystic fluid, pancreatitis involving portions of the head and uncinate process, wall thickening at the junction of second and third portions of duodenum likely due to an eye status secondary to adjacent pancreatitis, no abscess. - abdominal ultrasound showed cholelithiasis without acute cholecystitis, no biliary dilatation - Continue nothing by mouth, IV fluids, pain control, surgery consulted  Active Problems:   Morbid obesity ([place]) - BMI 40.61, patient recommended diet and weight control    Leukocytosis - likely reactive due to acute pancreatitis, afebrile, resolved  Hypokalemia - Replaced    Anxiety - currently stable, no SI or HI, continue Effexor, clonazepam as needed  GERD - restart PPI  Code Status: full  DVT Prophylaxis:  Lovenox  Family Communication: Discussed in detail with the  patient, all imaging results, lab results explained to the patient    Disposition Plan:   Time Spent in minutes 25 minutes  Procedures:  CT abdomen Ultrasound abdomen  Consultants:   Gen. surgery  Antimicrobials:      Medications  Scheduled Meds: . enoxaparin (LOVENOX) injection  40 mg Subcutaneous Q24H   Continuous Infusions: . sodium chloride 125 mL/hr at 10/23/16 0339   PRN Meds:.fentaNYL (SUBLIMAZE) injection, ondansetron **OR** ondansetron (ZOFRAN) IV   Antibiotics   Anti-infectives    None        Subjective:   [name] was seen and examined today. Epigastric pain, right upper quadrant pain, 5/10,ntermittent, no nausea vomiting or any fevers. Patient denies dizziness, chest pain, shortness of breath, new weakness, numbess, tingling. No acute events overnight.    Objective:   Vitals:   10/23/16 0245 10/23/16 0300 10/23/16 0340 10/23/16 0556  BP: 139/72 139/80 (!) 119/48 (!) 150/68  Pulse: 85 93 82 98  Resp: 16 17 17 17   Temp:   98.4 F (36.9 C) 98.5 F (36.9 C)  TempSrc:   Oral Oral  SpO2: 98% 98% 98% 97%  Weight:   114.1 kg (251 lb 8 oz)   Height:   5\' 6"  (1.676  m)     Intake/Output Summary (Last 24 hours) at 10/23/16 1055 Last data filed at 10/23/16 1002  Gross per 24 hour  Intake          1043.75 ml  Output            13400 ml  Net        -12356.25 ml     Wt Readings from Last 3 Encounters:  10/23/16 114.1 kg (251 lb 8 oz)  09/29/16 111.6 kg (246 lb)  09/15/16 112.4 kg (247 lb 12.8 oz)     Exam  General: Alert and oriented x 3, NAD  Eyes: PERRLA, EOMI, Anicteric Sclera,  HEENT:  Atraumatic, normocephalic  Cardiovascular: S1 S2 auscultated, no rubs, murmurs or gallops. Regular rate and rhythm.  Respiratory: Clear to auscultation bilaterally, no wheezing, rales or rhonchi  Gastrointestinal: Soft, epigastric,right upper quadrant TTP, nondistended, + bowel sounds  Ext: no pedal edema bilaterally  Neuro: AAOx3, Cr N's II-  XII. Strength 5/5 upper and lower extremities bilaterally, speech clear, sensations grossly intact  Musculoskeletal: No digital cyanosis, clubbing  Skin: No rashes  Psych: Normal affect and demeanor, alert and oriented x3    Data Reviewed:  I have personally reviewed following labs and imaging studies  Micro Results No results found for this or any previous visit (from the past 240 hour(s)).  Radiology Reports Ct Abdomen Pelvis W Contrast  Result Date: 10/23/2016 CLINICAL DATA:  Abdominal pain and emesis. Elevated liver enzymes and lipase. EXAM: CT ABDOMEN AND PELVIS WITH CONTRAST TECHNIQUE: Multidetector CT imaging of the abdomen and pelvis was performed using the standard protocol following bolus administration of intravenous contrast. CONTRAST:  100 mL ISOVUE-300 IOPAMIDOL (ISOVUE-300) INJECTION 61% COMPARISON:  April 05, 2007 FINDINGS: Lower chest: There is left base atelectasis. Lung bases otherwise clear. Hepatobiliary: There are no focal liver lesions appreciable. There is evidence suggesting a degree of pericholecystic fluid. The gallbladder wall does not appear appreciably thickened, however. There is no biliary duct dilatation. There is inflammatory change involving a portion of the posterior head and uncinate process of the pancreas with mild soft tissue stranding in this area and immediately adjacent to the pancreas. Pancreas elsewhere appears normal. There is no well-defined fluid or pseudocyst in the pancreas. There is no pancreatic mass or pancreatic duct dilatation. Spleen: No splenic lesions are evident. Adrenals/Urinary Tract: Adrenals appear unremarkable bilaterally. Kidneys bilaterally show no evident mass or hydronephrosis on either side. There is no appreciable renal or ureteral calculus on either side. Urinary bladder is midline with wall thickness within normal limits. Stomach/Bowel: There is localized wall thickness at the junction of the second and third portions of  duodenum, immediately adjacent to the apparent pancreatic inflammation. No other bowel wall thickening is noted. No bowel obstruction. No free air or portal venous air. Vascular/Lymphatic: There are foci of atherosclerotic calcification in the aorta. There is no abdominal aortic aneurysm. Major mesenteric vessels appear patent. There is no appreciable adenopathy in the abdomen or pelvis. Reproductive: Uterus is absent.  No pelvic mass. Other: Appendix is not appreciable. There is no periappendiceal region inflammation. No abscess or ascites is apparent in the abdomen or pelvis. There is a minimal ventral hernia containing only fat. Musculoskeletal: There is a disc spacer at L5-S1 causing susceptibility artifact. There is degenerative change in the lumbar spine. There are no blastic or lytic bone lesions. There are no intramuscular or abdominal wall lesions. IMPRESSION: 1. Suspect a degree of pericholecystic fluid. Gallbladder wall does not  appear appreciably thickened, however. Advise correlation with gallbladder ultrasound to further assess. 2. Evidence of pancreatitis involving portions of the head and uncinate process. There is localized inflammatory change with soft tissue stranding in this portion of the pancreatitis and immediately adjacent structures. Remainder the pancreas appears normal. No mass or pseudocyst. No pancreatic duct dilatation. No fluid seen in the peripancreatic area. 3. Wall thickening at the junction of the second and third portions of the duodenum, likely duodenitis secondary to adjacent pancreatitis. No other bowel wall thickening. No bowel obstruction. 4.  No abscess in the abdomen or pelvis. 5. No renal or ureteral calculus. No hydronephrosis on either side. 6.  Aortic atherosclerosis. 7.  Minimal ventral hernia containing only fat. 8.  Artifact from metallic disc spacer at L2-G4. Aortic Atherosclerosis (ICD10-I70.0). Electronically Signed   By: Lowella Grip III M.D.   On:  10/23/2016 01:16   US Abdomen Limited  Result Date: 10/22/2016 CLINICAL DATA:  Initial evaluation regarding fracture pain, elevated liver enzymes. EXAM: ULTRASOUND ABDOMEN LIMITED RIGHT UPPER QUADRANT COMPARISON:  Prior ultrasound from 10/07/2016. FINDINGS: Gallbladder: Scattered echogenic calculi present within the gallbladder lumen, largest measuring 5 mm. Gallbladder wall measured within normal limits at 2.1 mm. No free pericholecystic fluid. No sonographic Murphy sign elicited on exam. Common bile duct: Diameter: 4.1 mm Liver: No focal lesion identified. Within normal limits in parenchymal echogenicity. Portal vein is patent on color Doppler imaging with normal direction of blood flow towards the liver. IMPRESSION: 1. Cholelithiasis without additional sonographic features to suggest acute cholecystitis. 2. No biliary dilatation. Electronically Signed   By: Jeannine Boga M.D.   On: 10/22/2016 23:48   Dg Bone Density  Result Date: 10/04/2016 EXAM: DUAL X-RAY ABSORPTIOMETRY (DXA) FOR BONE MINERAL DENSITY IMPRESSION: Referring Physician:  Alda Berthold PAZ PATIENT: Name: Mande, Auvil Patient ID: [id number] Birth Date: 1945-07-24 Height: 65.5 in. Sex: Female Measured: 10/04/2016 Weight: 246.8 lbs. Indications: Advanced Age, Caucasian, History of Osteopenia, Hysterectomy, Low Calcium Intake, Oophorectomy ( Bilateral), Post Menopausal, Recurrent Falls, Vitamin D Deficiency Fractures: Treatments: Estrogen ASSESSMENT: The BMD measured at Femur Neck Right is 0.775 g/cm2 with a T-score of -1.9. This patient is considered OSTEOPENIC according to [place] [facility]) criteria. Site Region Measured Date Measured Age WHO YA BMD Classification T-score AP Spine L1-L4 10/04/2016 71.0 years Normal 0.2 1.208 g/cm2 DualFemur Neck Right 10/04/2016 72.0 [age] Osteopenia -1.9 0.775 g/cm2 World Health Organization [facility]) criteria for post-menopausal, Caucasian Women: Normal       T-score at or above -1 SD Osteopenia    T-score between -1 and -2.5 SD Osteoporosis T-score at or below -2.5 SD RECOMMENDATION: [place] recommends that FDA-approved medical therapies be considered in postmenopausal women and men age 62 or older with a: 1. Hip or vertebral (clinical or morphometric) fracture. 2. T-score of < -2.5 at the spine or hip. 3. Ten-year fracture probability by FRAX of 3% or greater for hip fracture or 20% or greater for major osteoporotic fracture. All treatment decisions require clinical judgment and consideration of individual patient factors, including patient preferences, co-morbidities, previous drug use, risk factors not captured in the FRAX model (e.g. falls, vitamin D deficiency, increased bone turnover, interval significant decline in bone density) and possible under - or over-estimation of fracture risk by FRAX. All patients should ensure an adequate intake of dietary calcium (1200 mg/d) and vitamin D (800 IU daily) unless contraindicated. FOLLOW-UP: People with diagnosed cases of osteoporosis or at high risk for fracture should have regular bone  mineral density tests. For patients eligible for Medicare, routine testing is allowed once every 2 years. The testing frequency can be increased to one year for patients who have rapidly progressing disease, those who are receiving or discontinuing medical therapy to restore bone mass, or have additional risk factors. I have reviewed this report and agree with the above findings. Mark A. Thornton Papas, M.D. [facility] Radiology Patient: Mamie Laurel Referring Physician: Colon Branch Birth Date: November 20, 1945 [age] Patient ID: [id number] Height: 65.5 in. Weight: 246.8 lbs. Measured: 10/04/2016 2:43:04 PM (16 SP 2) Sex: Female Ethnicity: White Analyzed: 10/04/2016 2:43:40 PM (16 SP 2) FRAX* 10-year Probability of Fracture Based on femoral neck BMD: DualFemur (Right) Major Osteoporotic Fracture: 10.5% Hip Fracture:                2.0% Population:                   [place] (Caucasian) Risk Factors:                None *FRAX is a Materials engineer of the [employer] for Metabolic Bone Disease, a World Pharmacologist (WHO) [facility]. ASSESSMENT: The probability of a major osteoporotic fracture is 10.5% within the next ten years. The probability of a hip fracture is 2.0% within the next ten years. I have reviewed this report and agree with the above findings. Mark A. Thornton Papas, M.D. [facility] Radiology Electronically Signed   By: Lavonia Dana M.D.   On: 10/04/2016 15:45   US Abdomen Limited Ruq  Result Date: 10/07/2016 CLINICAL DATA:  Epigastric pain EXAM: ULTRASOUND ABDOMEN LIMITED RIGHT UPPER QUADRANT COMPARISON:  None. FINDINGS: Gallbladder: No gallstones or wall thickening visualized. No sonographic Murphy sign noted by sonographer. Common bile duct: Diameter: Normal caliber, 4 mm Liver: No focal lesion identified. Within normal limits in parenchymal echogenicity. Portal vein is patent on color Doppler imaging with normal direction of blood flow towards the liver. IMPRESSION: Unremarkable right upper quadrant ultrasound. Electronically Signed   By: Rolm Baptise M.D.   On: 10/07/2016 11:53    Lab Data:  CBC:  Recent Labs Lab 10/22/16 1800 10/23/16 0431  WBC 11.0* 8.0  HGB 12.2 10.1*  HCT 38.1 32.7*  MCV 92.5 92.4  PLT 237 510   Basic Metabolic Panel:  Recent Labs Lab 10/22/16 1800 10/23/16 0431  NA 138 138  K 4.1 3.4*  CL 107 111  CO2 25 23  GLUCOSE 110* 97  BUN 16 15  CREATININE 1.01* 0.92  CALCIUM 8.6* 7.6*   GFR: Estimated Creatinine Clearance: 70.9 mL/min (by C-G formula based on SCr of 0.92 mg/dL). Liver Function Tests:  Recent Labs Lab 10/22/16 1800 10/23/16 0431  AST 380* 152*  ALT 239* 159*  ALKPHOS 116 87  BILITOT 0.8 0.7  PROT 7.2 5.6*  ALBUMIN 3.6 2.9*    Recent Labs Lab 10/22/16 1800  LIPASE 835*   No results for input(s): AMMONIA in the last 168 hours. Coagulation  Profile: No results for input(s): INR, PROTIME in the last 168 hours. Cardiac Enzymes: No results for input(s): CKTOTAL, CKMB, CKMBINDEX, TROPONINI in the last 168 hours. BNP (last 3 results) No results for input(s): PROBNP in the last 8760 hours. HbA1C: No results for input(s): HGBA1C in the last 72 hours. CBG: No results for input(s): GLUCAP in the last 168 hours. Lipid Profile: No results for input(s): CHOL, HDL, LDLCALC, TRIG, CHOLHDL, LDLDIRECT in the last  72 hours. Thyroid Function Tests: No results for input(s): TSH, T4TOTAL, FREET4, T3FREE, THYROIDAB in the last 72 hours. Anemia Panel: No results for input(s): VITAMINB12, FOLATE, FERRITIN, TIBC, IRON, RETICCTPCT in the last 72 hours. Urine analysis:    Component Value Date/Time   COLORURINE YELLOW 10/23/2016 0626   APPEARANCEUR CLEAR 10/23/2016 0626   LABSPEC 1.040 (H) 10/23/2016 0626   PHURINE 5.0 10/23/2016 0626   GLUCOSEU NEGATIVE 10/23/2016 0626   GLUCOSEU NEGATIVE 07/12/2014 1310   HGBUR NEGATIVE 10/23/2016 0626   HGBUR negative 12/11/2007 1029   BILIRUBINUR NEGATIVE 10/23/2016 0626   BILIRUBINUR Negative 07/05/2014 1542   KETONESUR NEGATIVE 10/23/2016 0626   PROTEINUR NEGATIVE 10/23/2016 0626   UROBILINOGEN >=8.0 (A) 07/12/2014 1310   NITRITE NEGATIVE 10/23/2016 0626   LEUKOCYTESUR NEGATIVE 10/23/2016 0626     Ripudeep Rai M.D. [facility] 10/23/2016, 10:55 AM  Pager: [phone] Between 7am to 7pm - call Pager - [phone]  After 7pm go to [email or web address] - password [email or web address]  Call night coverage person covering after 7pm

## 2016-10-23 NOTE — ED Notes (Signed)
Patient transported to CT 

## 2016-10-23 NOTE — Consult Note (Signed)
Reason for Consult:gallstone pancreatitis  Referring Physician: Ripudeep Rai; [facility]      [name] is an 71 y.o. female.   HPI:   This is a 71 year old female admitted by the [facility] with gallstone pancreatitis.  She had episode of upper abdominal pain in July and a second episode in August.  These were more transient pins second episode was associated with nausea.  She saw Lucio Edward who was concerned about gallbladder disease but an ultrasound was completely normal.  No lab work was obtained that I can tell.     She presented to the emergency departmentlast night with more severe epigastric pain nausea and vomiting.lab work showed hemoglobin 12.2.  WBC 11,000.  Lipase 835.  AST 380.  ALT 239.  Total bilirubin 0.8.  Calcium 8.6.  Potassium 4.1.      Upper abdominal ultrasound now is read as showing scattered echogenic calculi largest measuring 5 mm.  Gallbladder wall month thickened.  No free pericholecystic fluid.  CBD 4.1 mm.      CT scan showed some evidence of pancreaticTitus and pancreas edema involving the head and uncinate process with a little bit of stranding in the area  PD not dilated.  Well perfused.  No ischemia.  No significant fluid collections.  A little bit of duodenitis noted.      She just got to the floor.  Is feeling a little bit better but still has a fair amount of epigastric pain.      Past history significant for TAH and BSO for endometriosis many years ago through Pfannenstiel incision.  Otherwise no abdominal surgery.  Other comorbidities include morbid obesity with BMI 40.  GERD.hypertension.  Migraines.hyperlipidemia.Barrett's esophagus without dysplasia.       Past Medical History:  Diagnosis Date  . Anemia   . Anxiety   . Barrett's esophagus   . Bilateral carpal tunnel syndrome    Dr. Eddie Dibbles, having injection therapy  . C. difficile diarrhea 08/01/2012   severe 2014  . Chronic cystitis    Dr. Matilde Sprang  . Chronic lower  back pain   . Depression   . DJD (degenerative joint disease)    bilateral hands; knees  . Family history of anesthesia complication    Mother had severe N/V  . GERD (gastroesophageal reflux disease)   . H/O cardiac catheterization    (-) cath 12-2002  , cath again 2011 (-)  . HTN (hypertension)   . Hyperlipidemia   . Insomnia   . Migraine   . Osteoporosis    pt unsure of this  . RLS (restless legs syndrome)   . Vitamin B 12 deficiency 04/09/2013    Past Surgical History:  Procedure Laterality Date  . Arm surgery Left    "don't remember what they did; arm wasn't broken"  . BILATERAL KNEE ARTHROSCOPY Bilateral   . CARDIAC CATHETERIZATION  01/22/10   clean cath  . CATARACT EXTRACTION W/ INTRAOCULAR LENS  IMPLANT, BILATERAL Bilateral   . COLONOSCOPY W/ POLYPECTOMY    . FOOT SURGERY Bilateral    toenails removed; callus removed on right; hammertoes right"  . Knot     "removed from right neck; not a goiter"  . LUMBAR DISC SURGERY     L5 S1 anterior fusion  . OOPHORECTOMY    . SHOULDER ARTHROSCOPY Right   . TOTAL KNEE ARTHROPLASTY  10/05/2011   Procedure: TOTAL KNEE ARTHROPLASTY;  Surgeon: Hessie Dibble, MD;  Location: [facility];  Service: Orthopedics;  Laterality: Right;  [name] VAGINAL HYSTERECTOMY     for endometriosis    Family History  Problem Relation Age of Onset  . Lung cancer Mother   . Dementia Father   . CAD Father        dx in his 33s  . Stroke Father   . Colon cancer Maternal Grandfather   . Esophageal cancer Brother   . Breast cancer Sister   . Diabetes Other        Aunt  . Colon cancer Maternal Uncle        2  . Rectal cancer Neg Hx   . Stomach cancer Neg Hx     Social History:  reports that she quit smoking about 41 years ago. She has a 5.00 pack-year smoking history. She has never used smokeless tobacco. She reports that she drinks alcohol. She reports that she does not use drugs.  Allergies:  Allergies  Allergen Reactions  .  Dextromethorphan-Guaifenesin Nausea And Vomiting  . Morphine Nausea And Vomiting  . Penicillins Rash    "> 30 years ago; best I can remember it was just a light rash on my arm"    Medications: I have reviewed the patient's current medications.  Results for orders placed or performed during the hospital encounter of 10/22/16 (from the past 48 hour(s))  Comprehensive metabolic panel     Status: Abnormal   Collection Time: 10/22/16  6:00 PM  Result Value Ref Range   Sodium 138 135 - 145 mmol/L   Potassium 4.1 3.5 - 5.1 mmol/L   Chloride 107 101 - 111 mmol/L   CO2 25 22 - 32 mmol/L   Glucose, Bld 110 (H) 65 - 99 mg/dL   BUN 16 6 - 20 mg/dL   Creatinine, Ser 1.01 (H) 0.44 - 1.00 mg/dL   Calcium 8.6 (L) 8.9 - 10.3 mg/dL   Total Protein 7.2 6.5 - 8.1 g/dL   Albumin 3.6 3.5 - 5.0 g/dL   AST 380 (H) 15 - 41 U/L   ALT 239 (H) 14 - 54 U/L   Alkaline Phosphatase 116 38 - 126 U/L   Total Bilirubin 0.8 0.3 - 1.2 mg/dL   GFR calc non Af Amer 54 (L) >60 mL/min   GFR calc Af Amer >60 >60 mL/min    Comment: (NOTE) The eGFR has been calculated using the CKD EPI equation. This calculation has not been validated in all clinical situations. eGFR's persistently <60 mL/min signify possible Chronic Kidney Disease.    Anion gap 6 5 - 15  CBC     Status: Abnormal   Collection Time: 10/22/16  6:00 PM  Result Value Ref Range   WBC 11.0 (H) 4.0 - 10.5 K/uL   RBC 4.12 3.87 - 5.11 MIL/uL   Hemoglobin 12.2 12.0 - 15.0 g/dL   HCT 38.1 36.0 - 46.0 %   MCV 92.5 78.0 - 100.0 fL   MCH 29.6 26.0 - 34.0 pg   MCHC 32.0 30.0 - 36.0 g/dL   RDW 13.8 11.5 - 15.5 %   Platelets 237 150 - 400 K/uL  Type and screen [place]     Status: None   Collection Time: 10/22/16  6:00 PM  Result Value Ref Range   ABO/RH(D) O NEG    Antibody Screen NEG    Sample Expiration 10/25/2016   Lipase, blood     Status: Abnormal   Collection Time: 10/22/16  6:00 PM  Result Value Ref Range   Lipase  835 (H) 11 -  51 U/L    Comment: RESULTS CONFIRMED BY MANUAL DILUTION  POC occult blood, ED     Status: None   Collection Time: 10/22/16 10:46 PM  Result Value Ref Range   Fecal Occult Bld NEGATIVE NEGATIVE  CBC     Status: Abnormal   Collection Time: 10/23/16  4:31 AM  Result Value Ref Range   WBC 8.0 4.0 - 10.5 K/uL   RBC 3.54 (L) 3.87 - 5.11 MIL/uL   Hemoglobin 10.1 (L) 12.0 - 15.0 g/dL   HCT 32.7 (L) 36.0 - 46.0 %   MCV 92.4 78.0 - 100.0 fL   MCH 28.5 26.0 - 34.0 pg   MCHC 30.9 30.0 - 36.0 g/dL   RDW 14.1 11.5 - 15.5 %   Platelets 205 150 - 400 K/uL  Comprehensive metabolic panel     Status: Abnormal   Collection Time: 10/23/16  4:31 AM  Result Value Ref Range   Sodium 138 135 - 145 mmol/L   Potassium 3.4 (L) 3.5 - 5.1 mmol/L    Comment: DELTA CHECK NOTED   Chloride 111 101 - 111 mmol/L   CO2 23 22 - 32 mmol/L   Glucose, Bld 97 65 - 99 mg/dL   BUN 15 6 - 20 mg/dL   Creatinine, Ser 0.92 0.44 - 1.00 mg/dL   Calcium 7.6 (L) 8.9 - 10.3 mg/dL   Total Protein 5.6 (L) 6.5 - 8.1 g/dL   Albumin 2.9 (L) 3.5 - 5.0 g/dL   AST 152 (H) 15 - 41 U/L   ALT 159 (H) 14 - 54 U/L   Alkaline Phosphatase 87 38 - 126 U/L   Total Bilirubin 0.7 0.3 - 1.2 mg/dL   GFR calc non Af Amer >60 >60 mL/min   GFR calc Af Amer >60 >60 mL/min    Comment: (NOTE) The eGFR has been calculated using the CKD EPI equation. This calculation has not been validated in all clinical situations. eGFR's persistently <60 mL/min signify possible Chronic Kidney Disease.    Anion gap 4 (L) 5 - 15  Lipase, blood     Status: Abnormal   Collection Time: 10/23/16  4:31 AM  Result Value Ref Range   Lipase 189 (H) 11 - 51 U/L  Urinalysis, Routine w reflex microscopic     Status: Abnormal   Collection Time: 10/23/16  6:26 AM  Result Value Ref Range   Color, Urine YELLOW YELLOW   APPearance CLEAR CLEAR   Specific Gravity, Urine 1.040 (H) 1.005 - 1.030   pH 5.0 5.0 - 8.0   Glucose, UA NEGATIVE NEGATIVE mg/dL   Hgb urine dipstick  NEGATIVE NEGATIVE   Bilirubin Urine NEGATIVE NEGATIVE   Ketones, ur NEGATIVE NEGATIVE mg/dL   Protein, ur NEGATIVE NEGATIVE mg/dL   Nitrite NEGATIVE NEGATIVE   Leukocytes, UA NEGATIVE NEGATIVE    Ct Abdomen Pelvis W Contrast  Result Date: 10/23/2016 CLINICAL DATA:  Abdominal pain and emesis. Elevated liver enzymes and lipase. EXAM: CT ABDOMEN AND PELVIS WITH CONTRAST TECHNIQUE: Multidetector CT imaging of the abdomen and pelvis was performed using the standard protocol following bolus administration of intravenous contrast. CONTRAST:  100 mL ISOVUE-300 IOPAMIDOL (ISOVUE-300) INJECTION 61% COMPARISON:  April 05, 2007 FINDINGS: Lower chest: There is left base atelectasis. Lung bases otherwise clear. Hepatobiliary: There are no focal liver lesions appreciable. There is evidence suggesting a degree of pericholecystic fluid. The gallbladder wall does not appear appreciably thickened, however. There is no biliary duct dilatation. There is  inflammatory change involving a portion of the posterior head and uncinate process of the pancreas with mild soft tissue stranding in this area and immediately adjacent to the pancreas. Pancreas elsewhere appears normal. There is no well-defined fluid or pseudocyst in the pancreas. There is no pancreatic mass or pancreatic duct dilatation. Spleen: No splenic lesions are evident. Adrenals/Urinary Tract: Adrenals appear unremarkable bilaterally. Kidneys bilaterally show no evident mass or hydronephrosis on either side. There is no appreciable renal or ureteral calculus on either side. Urinary bladder is midline with wall thickness within normal limits. Stomach/Bowel: There is localized wall thickness at the junction of the second and third portions of duodenum, immediately adjacent to the apparent pancreatic inflammation. No other bowel wall thickening is noted. No bowel obstruction. No free air or portal venous air. Vascular/Lymphatic: There are foci of atherosclerotic  calcification in the aorta. There is no abdominal aortic aneurysm. Major mesenteric vessels appear patent. There is no appreciable adenopathy in the abdomen or pelvis. Reproductive: Uterus is absent.  No pelvic mass. Other: Appendix is not appreciable. There is no periappendiceal region inflammation. No abscess or ascites is apparent in the abdomen or pelvis. There is a minimal ventral hernia containing only fat. Musculoskeletal: There is a disc spacer at L5-S1 causing susceptibility artifact. There is degenerative change in the lumbar spine. There are no blastic or lytic bone lesions. There are no intramuscular or abdominal wall lesions. IMPRESSION: 1. Suspect a degree of pericholecystic fluid. Gallbladder wall does not appear appreciably thickened, however. Advise correlation with gallbladder ultrasound to further assess. 2. Evidence of pancreatitis involving portions of the head and uncinate process. There is localized inflammatory change with soft tissue stranding in this portion of the pancreatitis and immediately adjacent structures. Remainder the pancreas appears normal. No mass or pseudocyst. No pancreatic duct dilatation. No fluid seen in the peripancreatic area. 3. Wall thickening at the junction of the second and third portions of the duodenum, likely duodenitis secondary to adjacent pancreatitis. No other bowel wall thickening. No bowel obstruction. 4.  No abscess in the abdomen or pelvis. 5. No renal or ureteral calculus. No hydronephrosis on either side. 6.  Aortic atherosclerosis. 7.  Minimal ventral hernia containing only fat. 8.  Artifact from metallic disc spacer at B3-Z3. Aortic Atherosclerosis (ICD10-I70.0). Electronically Signed   By: Lowella Grip III M.D.   On: 10/23/2016 01:16   US Abdomen Limited  Result Date: 10/22/2016 CLINICAL DATA:  Initial evaluation regarding fracture pain, elevated liver enzymes. EXAM: ULTRASOUND ABDOMEN LIMITED RIGHT UPPER QUADRANT COMPARISON:  Prior  ultrasound from 10/07/2016. FINDINGS: Gallbladder: Scattered echogenic calculi present within the gallbladder lumen, largest measuring 5 mm. Gallbladder wall measured within normal limits at 2.1 mm. No free pericholecystic fluid. No sonographic Murphy sign elicited on exam. Common bile duct: Diameter: 4.1 mm Liver: No focal lesion identified. Within normal limits in parenchymal echogenicity. Portal vein is patent on color Doppler imaging with normal direction of blood flow towards the liver. IMPRESSION: 1. Cholelithiasis without additional sonographic features to suggest acute cholecystitis. 2. No biliary dilatation. Electronically Signed   By: Jeannine Boga M.D.   On: 10/22/2016 23:48    Blood pressure (!) 150/68, pulse 98, temperature 98.5 F (36.9 C), temperature source Oral, resp. rate 17, height _0  (1.676 m), weight 114.1 kg (251 lb 8 oz), SpO2 97 %. General: Alert.  Pleasant.  Mild distress.  Mental status normal Head: Normocephalic.  No rashes.  Sclera clear Neck: Supple.  Nontender.  No mass or adenopathy.  Trachea normal Lungs: Clear to auscultation bilaterally Cardiovascular: Regular rate and rhythm.  No murmur or ectopy.  Radial and femoral pulses palpable Abdomen: Obese.  Tender with mild guarding in the epigastrium.  No significant distention.  No organomegaly.  Well-healed Pfannenstiel incision Musculoskeletal: Moves all  4 extremities well.  No edema Neurologic: Alert and oriented 4.  No gross motor sensory deficits.  Assessment/Plan: Biliary pancreatitis.  No evidence of abscess or pancreatic infarction.  No evidence of acute cholecystitis Recommend IV hydration, bowel rest, analgesics, anti-emetics, proton pump inhibitors Repeat lab work tomorrow Hopefully pancreatitis will resolve and she can undergo laparoscopic cholecystectomy with cholangiogram this admission  Morbid obesity Hypertension Barrett's esophagus GERD  Clemens Lachman M 10/23/2016, 1:03 PM

## 2016-10-23 NOTE — ED Provider Notes (Signed)
Complains of hypogastric abdominal pain with multiple episodes of vomiting and multiple episodes of diarrhea onset today. Nothing makes symptoms better or worse. She denies alcohol abuse. On exam she is alert nontoxic abdomen tender at epigastrium and hypogastrium without guarding rigidity or rebound   Orlie Dakin, MD 10/23/16 [id number]

## 2016-10-23 NOTE — ED Notes (Signed)
Patient returned from CT

## 2016-10-24 LAB — CBC
HCT: 33.3 % — ABNORMAL LOW (ref 36.0–46.0)
Hemoglobin: 10.4 g/dL — ABNORMAL LOW (ref 12.0–15.0)
MCH: 29 pg (ref 26.0–34.0)
MCHC: 31.2 g/dL (ref 30.0–36.0)
MCV: 92.8 fL (ref 78.0–100.0)
Platelets: 197 10*3/uL (ref 150–400)
RBC: 3.59 MIL/uL — ABNORMAL LOW (ref 3.87–5.11)
RDW: 13.9 % (ref 11.5–15.5)
WBC: 10.1 10*3/uL (ref 4.0–10.5)

## 2016-10-24 LAB — COMPREHENSIVE METABOLIC PANEL
ALT: 110 U/L — ABNORMAL HIGH (ref 14–54)
AST: 71 U/L — ABNORMAL HIGH (ref 15–41)
Albumin: 3 g/dL — ABNORMAL LOW (ref 3.5–5.0)
Alkaline Phosphatase: 90 U/L (ref 38–126)
Anion gap: 7 (ref 5–15)
BUN: 12 mg/dL (ref 6–20)
CO2: 19 mmol/L — ABNORMAL LOW (ref 22–32)
Calcium: 7.9 mg/dL — ABNORMAL LOW (ref 8.9–10.3)
Chloride: 111 mmol/L (ref 101–111)
Creatinine, Ser: 0.83 mg/dL (ref 0.44–1.00)
GFR calc Af Amer: >60 mL/min (ref >60)
GFR calc non Af Amer: >60 mL/min (ref >60)
Glucose, Bld: 67 mg/dL (ref 65–99)
Potassium: 3.5 mmol/L (ref 3.5–5.1)
Sodium: 137 mmol/L (ref 135–145)
Total Bilirubin: 1 mg/dL (ref 0.3–1.2)
Total Protein: 6 g/dL — ABNORMAL LOW (ref 6.5–8.1)

## 2016-10-24 LAB — LIPASE, BLOOD: Lipase: 44 U/L (ref 11–51)

## 2016-10-24 MED ORDER — CEFAZOLIN SODIUM-DEXTROSE 2-4 GM/100ML-% IV SOLN
2.0000 g | INTRAVENOUS | Status: AC
  Administered 2016-10-25: 2 g via INTRAVENOUS
  Filled 2016-10-24: qty 100

## 2016-10-24 MED ORDER — CHLORHEXIDINE GLUCONATE CLOTH 2 % EX PADS: 6.0000 | MEDICATED_PAD | Freq: Once | CUTANEOUS | Status: DC

## 2016-10-24 MED ORDER — CHLORHEXIDINE GLUCONATE CLOTH 2 % EX PADS
6.0000 | MEDICATED_PAD | Freq: Once | CUTANEOUS | Status: AC
  Administered 2016-10-24: 6 via TOPICAL

## 2016-10-24 MED ORDER — OXYCODONE-ACETAMINOPHEN 5-325 MG PO TABS
2.0000 | ORAL_TABLET | ORAL | Status: DC | PRN
  Administered 2016-10-24 – 2016-10-25 (×4): 2 via ORAL
  Filled 2016-10-24 (×4): qty 2

## 2016-10-24 NOTE — Progress Notes (Signed)
[facility]                                                                              Patient Demographics  [name], is a 71 y.o. female, DOB - 01/09/1946, CWC:[id number]  Admit date - 10/22/2016   Admitting Physician Norval Morton, MD  Outpatient Primary MD for the patient is Colon Branch, MD  Outpatient specialists:   LOS - 1  days   Medical records reviewed and are as summarized below:    Chief Complaint  Patient presents with  . Abdominal Pain  . GI Bleeding       Brief summary   Patient is a 43 year oldfemale with hypertension, hyperlipidemia, Barrett's esophagus, C. Difficile, depression, GERD presented with nausea, vomiting, intermittent abdominal pain since July 2018. She was being evaluated by Dr. Fuller Plan of GI in the outpatient setting. CT abdomen showed pancreatitis with cholelithiasis, no acute cholecystitis. lipase 835with elevated LFTs.    Assessment & Plan    Principal Problem:   Pancreatitis, acute with cholelithiasis - Patient presented with nausea, vomiting, abdominal pain, lipase 835 with transaminitis - CT abdomen showed pericholecystic fluid, pancreatitis involving portions of the head and uncinate process, wall thickening at the junction of second and third portions of duodenum likely due to an eye status secondary to adjacent pancreatitis, no abscess. - abdominal ultrasound showed cholelithiasis without acute cholecystitis, no biliary dilatation - better today, abdominal pain improving, no nausea or vomiting. Lipase down to 44 - surgery following, recommend to start clears today and NPO after midnight for laparoscopic cholecystectomy name  Active Problems:   Morbid obesity ([place]) - BMI 40.61, patient recommended diet and weight control    Leukocytosis - likely reactive due to acute pancreatitis, afebrile, resolved    Anxiety - currently stable, no SI or HI, continue Effexor, clonazepam as needed  GERD - continue  PPI  Hypokalemia-resolved  Code Status: full  DVT Prophylaxis:  Lovenox  Family Communication: Discussed in detail with the patient, all imaging results, lab results explained to the patient    Disposition Plan: plan for laparoscopic cholecystectomy in a.m.  Time Spent in minutes 25 minutes  Procedures:  CT abdomen Ultrasound abdomen  Consultants:   Gen. surgery  Antimicrobials:      Medications  Scheduled Meds: . mirabegron ER  25 mg Oral Daily  . pantoprazole (PROTONIX) IV  40 mg Intravenous Q24H  . rOPINIRole  1 mg Oral BID  . traZODone  50 mg Oral QHS  . venlafaxine XR  225 mg Oral Q breakfast   Continuous Infusions: . sodium chloride 125 mL/hr at 10/24/16 0157   PRN Meds:.clonazePAM, fentaNYL (SUBLIMAZE) injection, ondansetron **OR** ondansetron (ZOFRAN) IV, oxyCODONE-acetaminophen   Antibiotics   Anti-infectives    None        Subjective:   [name] was seen and examined today. Feeling a lot better today, abdominal pain improving, no nausea or vomiting, no fevers. Patient denies dizziness, chest pain, shortness of breath, new weakness, numbess, tingling. No acute events overnight.    Objective:   Vitals:   10/23/16 0556 10/23/16 1642 10/23/16 2220 10/24/16 0520  BP: [name])  150/68 (!) 145/66 (!) 142/71 123/62  Pulse: 98 93 96 81  Resp: 17 16 16 16   Temp: 98.5 F (36.9 C) 99.1 F (37.3 C) 98.7 F (37.1 C) 98.7 F (37.1 C)  TempSrc: Oral Oral Oral Oral  SpO2: 97% 100% 96% 95%  Weight:      Height:        Intake/Output Summary (Last 24 hours) at 10/24/16 1109 Last data filed at 10/24/16 5093  Gross per 24 hour  Intake          3058.75 ml  Output                0 ml  Net          3058.75 ml     Wt Readings from Last 3 Encounters:  10/23/16 114.1 kg (251 lb 8 oz)  09/29/16 111.6 kg (246 lb)  09/15/16 112.4 kg (247 lb 12.8 oz)     Exam  General: Alert and oriented x 3, NAD  Eyes:   HEENT:   Cardiovascular: S1 S2 clear, RRR,  No pedal edema b/l  Respiratory: Clear to auscultation bilaterally, no wheezing, rales or rhonchi  Gastrointestinal: Soft, mild epigastric tenderness, nondistended, + bowel sounds  Ext: no pedal edema bilaterally  Neuro: no new deficits  Musculoskeletal: No digital cyanosis, clubbing  Skin: No rashes  Psych: Normal affect and demeanor, alert and oriented x3    Data Reviewed:  I have personally reviewed following labs and imaging studies  Micro Results No results found for this or any previous visit (from the past 240 hour(s)).  Radiology Reports Ct Abdomen Pelvis W Contrast  Result Date: 10/23/2016 CLINICAL DATA:  Abdominal pain and emesis. Elevated liver enzymes and lipase. EXAM: CT ABDOMEN AND PELVIS WITH CONTRAST TECHNIQUE: Multidetector CT imaging of the abdomen and pelvis was performed using the standard protocol following bolus administration of intravenous contrast. CONTRAST:  100 mL ISOVUE-300 IOPAMIDOL (ISOVUE-300) INJECTION 61% COMPARISON:  April 05, 2007 FINDINGS: Lower chest: There is left base atelectasis. Lung bases otherwise clear. Hepatobiliary: There are no focal liver lesions appreciable. There is evidence suggesting a degree of pericholecystic fluid. The gallbladder wall does not appear appreciably thickened, however. There is no biliary duct dilatation. There is inflammatory change involving a portion of the posterior head and uncinate process of the pancreas with mild soft tissue stranding in this area and immediately adjacent to the pancreas. Pancreas elsewhere appears normal. There is no well-defined fluid or pseudocyst in the pancreas. There is no pancreatic mass or pancreatic duct dilatation. Spleen: No splenic lesions are evident. Adrenals/Urinary Tract: Adrenals appear unremarkable bilaterally. Kidneys bilaterally show no evident mass or hydronephrosis on either side. There is no appreciable renal or ureteral calculus on either side. Urinary bladder is midline  with wall thickness within normal limits. Stomach/Bowel: There is localized wall thickness at the junction of the second and third portions of duodenum, immediately adjacent to the apparent pancreatic inflammation. No other bowel wall thickening is noted. No bowel obstruction. No free air or portal venous air. Vascular/Lymphatic: There are foci of atherosclerotic calcification in the aorta. There is no abdominal aortic aneurysm. Major mesenteric vessels appear patent. There is no appreciable adenopathy in the abdomen or pelvis. Reproductive: Uterus is absent.  No pelvic mass. Other: Appendix is not appreciable. There is no periappendiceal region inflammation. No abscess or ascites is apparent in the abdomen or pelvis. There is a minimal ventral hernia containing only fat. Musculoskeletal: There is a disc spacer at  L5-S1 causing susceptibility artifact. There is degenerative change in the lumbar spine. There are no blastic or lytic bone lesions. There are no intramuscular or abdominal wall lesions. IMPRESSION: 1. Suspect a degree of pericholecystic fluid. Gallbladder wall does not appear appreciably thickened, however. Advise correlation with gallbladder ultrasound to further assess. 2. Evidence of pancreatitis involving portions of the head and uncinate process. There is localized inflammatory change with soft tissue stranding in this portion of the pancreatitis and immediately adjacent structures. Remainder the pancreas appears normal. No mass or pseudocyst. No pancreatic duct dilatation. No fluid seen in the peripancreatic area. 3. Wall thickening at the junction of the second and third portions of the duodenum, likely duodenitis secondary to adjacent pancreatitis. No other bowel wall thickening. No bowel obstruction. 4.  No abscess in the abdomen or pelvis. 5. No renal or ureteral calculus. No hydronephrosis on either side. 6.  Aortic atherosclerosis. 7.  Minimal ventral hernia containing only fat. 8.  Artifact  from metallic disc spacer at O2-V0. Aortic Atherosclerosis (ICD10-I70.0). Electronically Signed   By: Lowella Grip III M.D.   On: 10/23/2016 01:16   US Abdomen Limited  Result Date: 10/22/2016 CLINICAL DATA:  Initial evaluation regarding fracture pain, elevated liver enzymes. EXAM: ULTRASOUND ABDOMEN LIMITED RIGHT UPPER QUADRANT COMPARISON:  Prior ultrasound from 10/07/2016. FINDINGS: Gallbladder: Scattered echogenic calculi present within the gallbladder lumen, largest measuring 5 mm. Gallbladder wall measured within normal limits at 2.1 mm. No free pericholecystic fluid. No sonographic Murphy sign elicited on exam. Common bile duct: Diameter: 4.1 mm Liver: No focal lesion identified. Within normal limits in parenchymal echogenicity. Portal vein is patent on color Doppler imaging with normal direction of blood flow towards the liver. IMPRESSION: 1. Cholelithiasis without additional sonographic features to suggest acute cholecystitis. 2. No biliary dilatation. Electronically Signed   By: Jeannine Boga M.D.   On: 10/22/2016 23:48   Dg Bone Density  Result Date: 10/04/2016 EXAM: DUAL X-RAY ABSORPTIOMETRY (DXA) FOR BONE MINERAL DENSITY IMPRESSION: Referring Physician:  Alda Berthold PAZ PATIENT: Name: Parneet, Glantz Patient ID: [id number] Birth Date: 1945/10/20 Height: 65.5 in. Sex: Female Measured: 10/04/2016 Weight: 246.8 lbs. Indications: Advanced Age, Caucasian, History of Osteopenia, Hysterectomy, Low Calcium Intake, Oophorectomy ( Bilateral), Post Menopausal, Recurrent Falls, Vitamin D Deficiency Fractures: Treatments: Estrogen ASSESSMENT: The BMD measured at Femur Neck Right is 0.775 g/cm2 with a T-score of -1.9. This patient is considered OSTEOPENIC according to [facility]) criteria. Site Region Measured Date Measured Age WHO YA BMD Classification T-score AP Spine L1-L4 10/04/2016 [age] Normal 0.2 1.208 g/cm2 DualFemur Neck Right 10/04/2016 [age] Osteopenia -1.9 0.775 g/cm2  World Health Organization [facility]) criteria for post-menopausal, Caucasian Women: Normal       T-score at or above -1 SD Osteopenia   T-score between -1 and -2.5 SD Osteoporosis T-score at or below -2.5 SD RECOMMENDATION: [place] recommends that FDA-approved medical therapies be considered in postmenopausal women and men age 62 or older with a: 1. Hip or vertebral (clinical or morphometric) fracture. 2. T-score of < -2.5 at the spine or hip. 3. Ten-year fracture probability by FRAX of 3% or greater for hip fracture or 20% or greater for major osteoporotic fracture. All treatment decisions require clinical judgment and consideration of individual patient factors, including patient preferences, co-morbidities, previous drug use, risk factors not captured in the FRAX model (e.g. falls, vitamin D deficiency, increased bone turnover, interval significant decline in bone density) and possible under - or over-estimation of fracture  risk by FRAX. All patients should ensure an adequate intake of dietary calcium (1200 mg/d) and vitamin D (800 IU daily) unless contraindicated. FOLLOW-UP: People with diagnosed cases of osteoporosis or at high risk for fracture should have regular bone mineral density tests. For patients eligible for Medicare, routine testing is allowed once every 2 years. The testing frequency can be increased to one year for patients who have rapidly progressing disease, those who are receiving or discontinuing medical therapy to restore bone mass, or have additional risk factors. I have reviewed this report and agree with the above findings. Mark A. Thornton Papas, M.D. [facility] Radiology Patient: Mamie Laurel Referring Physician: Colon Branch Birth Date: 11/24/1945 [age] Patient ID: [id number] Height: 65.5 in. Weight: 246.8 lbs. Measured: 10/04/2016 2:43:04 PM (16 SP 2) Sex: Female Ethnicity: White Analyzed: 10/04/2016 2:43:40 PM (16 SP 2) FRAX* 10-year Probability of Fracture  Based on femoral neck BMD: DualFemur (Right) Major Osteoporotic Fracture: 10.5% Hip Fracture:                2.0% Population:                  [place] (Caucasian) Risk Factors:                None *FRAX is a Materials engineer of the [employer] for Metabolic Bone Disease, a World Pharmacologist (WHO) [facility]. ASSESSMENT: The probability of a major osteoporotic fracture is 10.5% within the next ten years. The probability of a hip fracture is 2.0% within the next ten years. I have reviewed this report and agree with the above findings. Mark A. Thornton Papas, M.D. [facility] Electronically Signed   By: Lavonia Dana M.D.   On: 10/04/2016 15:45   US Abdomen Limited Ruq  Result Date: 10/07/2016 CLINICAL DATA:  Epigastric pain EXAM: ULTRASOUND ABDOMEN LIMITED RIGHT UPPER QUADRANT COMPARISON:  None. FINDINGS: Gallbladder: No gallstones or wall thickening visualized. No sonographic Murphy sign noted by sonographer. Common bile duct: Diameter: Normal caliber, 4 mm Liver: No focal lesion identified. Within normal limits in parenchymal echogenicity. Portal vein is patent on color Doppler imaging with normal direction of blood flow towards the liver. IMPRESSION: Unremarkable right upper quadrant ultrasound. Electronically Signed   By: Rolm Baptise M.D.   On: 10/07/2016 11:53    Lab Data:  CBC:  Recent Labs Lab 10/22/16 1800 10/23/16 0431 10/24/16 0412  WBC 11.0* 8.0 10.1  HGB 12.2 10.1* 10.4*  HCT 38.1 32.7* 33.3*  MCV 92.5 92.4 92.8  PLT 237 205 865   Basic Metabolic Panel:  Recent Labs Lab 10/22/16 1800 10/23/16 0431 10/24/16 0412  NA 138 138 137  K 4.1 3.4* 3.5  CL 107 111 111  CO2 25 23 19*  GLUCOSE 110* 97 67  BUN 16 15 12   CREATININE 1.01* 0.92 0.83  CALCIUM 8.6* 7.6* 7.9*   GFR: Estimated Creatinine Clearance: 78.5 mL/min (by C-G formula based on SCr of 0.83 mg/dL). Liver Function Tests:  Recent Labs Lab 10/22/16 1800 10/23/16 0431  10/24/16 0412  AST 380* 152* 71*  ALT 239* 159* 110*  ALKPHOS 116 87 90  BILITOT 0.8 0.7 1.0  PROT 7.2 5.6* 6.0*  ALBUMIN 3.6 2.9* 3.0*    Recent Labs Lab 10/22/16 1800 10/23/16 0431 10/24/16 0412  LIPASE 835* 189* 44   No results for input(s): AMMONIA in the last 168 hours. Coagulation Profile: No results for input(s): INR, PROTIME  in the last 168 hours. Cardiac Enzymes: No results for input(s): CKTOTAL, CKMB, CKMBINDEX, TROPONINI in the last 168 hours. BNP (last 3 results) No results for input(s): PROBNP in the last 8760 hours. HbA1C: No results for input(s): HGBA1C in the last 72 hours. CBG: No results for input(s): GLUCAP in the last 168 hours. Lipid Profile: No results for input(s): CHOL, HDL, LDLCALC, TRIG, CHOLHDL, LDLDIRECT in the last 72 hours. Thyroid Function Tests: No results for input(s): TSH, T4TOTAL, FREET4, T3FREE, THYROIDAB in the last 72 hours. Anemia Panel: No results for input(s): VITAMINB12, FOLATE, FERRITIN, TIBC, IRON, RETICCTPCT in the last 72 hours. Urine analysis:    Component Value Date/Time   COLORURINE YELLOW 10/23/2016 0626   APPEARANCEUR CLEAR 10/23/2016 0626   LABSPEC 1.040 (H) 10/23/2016 0626   PHURINE 5.0 10/23/2016 0626   GLUCOSEU NEGATIVE 10/23/2016 0626   GLUCOSEU NEGATIVE 07/12/2014 1310   HGBUR NEGATIVE 10/23/2016 0626   HGBUR negative 12/11/2007 1029   BILIRUBINUR NEGATIVE 10/23/2016 0626   BILIRUBINUR Negative 07/05/2014 1542   KETONESUR NEGATIVE 10/23/2016 0626   PROTEINUR NEGATIVE 10/23/2016 0626   UROBILINOGEN >=8.0 (A) 07/12/2014 1310   NITRITE NEGATIVE 10/23/2016 0626   LEUKOCYTESUR NEGATIVE 10/23/2016 0626     Cathlene Gardella M.D. [facility] 10/24/2016, 11:09 AM  Pager: [phone] Between 7am to 7pm - call Pager - [phone]  After 7pm go to [email or web address] - password [email or web address]  Call night coverage person covering after 7pm

## 2016-10-24 NOTE — Progress Notes (Signed)
Subjective: Feels better.  Much less pain.  No nausea but not much appetite either. Afebrile.  Heart rate 81.  Normotensive. WBC 10,100.  Hemoglobin 10.4.  Lipase down to 44.  Transaminases lower.  Bilirubin normal.  Potassium 3.5.  Objective: Vital signs in last 24 hours: Temp:  [98.7 F (37.1 C)-99.1 F (37.3 C)] 98.7 F (37.1 C) (09/16 0520) Pulse Rate:  [81-96] 81 (09/16 0520) Resp:  [16] 16 (09/16 0520) BP: (123-145)/(62-71) 123/62 (09/16 0520) SpO2:  [95 %-100 %] 95 % (09/16 0520) Last BM Date: 10/22/16  Intake/Output from previous day: 09/15 0701 - 09/16 0700 In: 2818.8 [I.V.:2818.8] Out: 1300 [Urine:1300] Intake/Output this shift: No intake/output data recorded.  General appearance: alert.  Pleasant.  Ambulating.  No distress. Neck: no adenopathy,trachea midline,  No mass or adenopathy. Resp: clear to auscultation bilaterally GI: residual epigastric tenderness without guarding or mass.  Otherwise soft.  Well-healed Pfannenstiel incision Extremities: extremities normal, atraumatic, no cyanosis or edema  Lab Results:   Recent Labs  10/23/16 0431 10/24/16 0412  WBC 8.0 10.1  HGB 10.1* 10.4*  HCT 32.7* 33.3*  PLT 205 197   BMET  Recent Labs  10/23/16 0431 10/24/16 0412  NA 138 137  K 3.4* 3.5  CL 111 111  CO2 23 19*  GLUCOSE 97 67  BUN 15 12  CREATININE 0.92 0.83  CALCIUM 7.6* 7.9*   PT/INR No results for input(s): LABPROT, INR in the last 72 hours. ABG No results for input(s): PHART, HCO3 in the last 72 hours.  Invalid input(s): PCO2, PO2  Studies/Results: Ct Abdomen Pelvis W Contrast  Result Date: 10/23/2016 CLINICAL DATA:  Abdominal pain and emesis. Elevated liver enzymes and lipase. EXAM: CT ABDOMEN AND PELVIS WITH CONTRAST TECHNIQUE: Multidetector CT imaging of the abdomen and pelvis was performed using the standard protocol following bolus administration of intravenous contrast. CONTRAST:  100 mL ISOVUE-300 IOPAMIDOL (ISOVUE-300)  INJECTION 61% COMPARISON:  April 05, 2007 FINDINGS: Lower chest: There is left base atelectasis. Lung bases otherwise clear. Hepatobiliary: There are no focal liver lesions appreciable. There is evidence suggesting a degree of pericholecystic fluid. The gallbladder wall does not appear appreciably thickened, however. There is no biliary duct dilatation. There is inflammatory change involving a portion of the posterior head and uncinate process of the pancreas with mild soft tissue stranding in this area and immediately adjacent to the pancreas. Pancreas elsewhere appears normal. There is no well-defined fluid or pseudocyst in the pancreas. There is no pancreatic mass or pancreatic duct dilatation. Spleen: No splenic lesions are evident. Adrenals/Urinary Tract: Adrenals appear unremarkable bilaterally. Kidneys bilaterally show no evident mass or hydronephrosis on either side. There is no appreciable renal or ureteral calculus on either side. Urinary bladder is midline with wall thickness within normal limits. Stomach/Bowel: There is localized wall thickness at the junction of the second and third portions of duodenum, immediately adjacent to the apparent pancreatic inflammation. No other bowel wall thickening is noted. No bowel obstruction. No free air or portal venous air. Vascular/Lymphatic: There are foci of atherosclerotic calcification in the aorta. There is no abdominal aortic aneurysm. Major mesenteric vessels appear patent. There is no appreciable adenopathy in the abdomen or pelvis. Reproductive: Uterus is absent.  No pelvic mass. Other: Appendix is not appreciable. There is no periappendiceal region inflammation. No abscess or ascites is apparent in the abdomen or pelvis. There is a minimal ventral hernia containing only fat. Musculoskeletal: There is a disc spacer at L5-S1 causing susceptibility artifact. There is  degenerative change in the lumbar spine. There are no blastic or lytic bone lesions. There  are no intramuscular or abdominal wall lesions. IMPRESSION: 1. Suspect a degree of pericholecystic fluid. Gallbladder wall does not appear appreciably thickened, however. Advise correlation with gallbladder ultrasound to further assess. 2. Evidence of pancreatitis involving portions of the head and uncinate process. There is localized inflammatory change with soft tissue stranding in this portion of the pancreatitis and immediately adjacent structures. Remainder the pancreas appears normal. No mass or pseudocyst. No pancreatic duct dilatation. No fluid seen in the peripancreatic area. 3. Wall thickening at the junction of the second and third portions of the duodenum, likely duodenitis secondary to adjacent pancreatitis. No other bowel wall thickening. No bowel obstruction. 4.  No abscess in the abdomen or pelvis. 5. No renal or ureteral calculus. No hydronephrosis on either side. 6.  Aortic atherosclerosis. 7.  Minimal ventral hernia containing only fat. 8.  Artifact from metallic disc spacer at K7-Q2. Aortic Atherosclerosis (ICD10-I70.0). Electronically Signed   By: Lowella Grip III M.D.   On: 10/23/2016 01:16   US Abdomen Limited  Result Date: 10/22/2016 CLINICAL DATA:  Initial evaluation regarding fracture pain, elevated liver enzymes. EXAM: ULTRASOUND ABDOMEN LIMITED RIGHT UPPER QUADRANT COMPARISON:  Prior ultrasound from 10/07/2016. FINDINGS: Gallbladder: Scattered echogenic calculi present within the gallbladder lumen, largest measuring 5 mm. Gallbladder wall measured within normal limits at 2.1 mm. No free pericholecystic fluid. No sonographic Murphy sign elicited on exam. Common bile duct: Diameter: 4.1 mm Liver: No focal lesion identified. Within normal limits in parenchymal echogenicity. Portal vein is patent on color Doppler imaging with normal direction of blood flow towards the liver. IMPRESSION: 1. Cholelithiasis without additional sonographic features to suggest acute cholecystitis. 2. No  biliary dilatation. Electronically Signed   By: Jeannine Boga M.D.   On: 10/22/2016 23:48    Anti-infectives: Anti-infectives    None      Assessment/Plan:  Biliary pancreatitis.  No evidence of abscess or pancreatic infarction.  No evidence of acute cholecystitis This seems to be resolving without major complication Allow clear liquid diet today Nothing by mouth after midnight Plan to proceed with laparoscopic cholecystectomy with cholangiogram tomorrow. I informed her that Dr. Georganna Skeans would be her surgeon I discussed the indications, details, techniques, and numerous risk of the surgery with her and her husband.  She is aware the risk of bleeding, infection, bile leak, wound hernia, injury to adjacent organs with major reconstructive surgery, conversion to open laparotomy  Prolonged hospitalization if common duct stones found.  She understands all these issues.  All of her questions are answered.  She agrees with this plan.  Repeat lab work tomorrow preop  Hold Lovenox   Morbid obesity Hypertension Barrett's esophagus GERD    LOS: 1 day    Fanny Skates M 10/24/2016

## 2016-10-25 ENCOUNTER — Inpatient Hospital Stay (HOSPITAL_COMMUNITY): Payer: Medicare Other | Admitting: Certified Registered"

## 2016-10-25 ENCOUNTER — Encounter (HOSPITAL_COMMUNITY)
Admission: EM | Disposition: A | Discharge status: Home / Self Care | Payer: Self-pay | Source: Home / Self Care | Attending: Internal Medicine

## 2016-10-25 HISTORY — PX: CHOLECYSTECTOMY: SHX55

## 2016-10-25 LAB — COMPREHENSIVE METABOLIC PANEL
ALT: 74 U/L — ABNORMAL HIGH (ref 14–54)
AST: 36 U/L (ref 15–41)
Albumin: 2.8 g/dL — ABNORMAL LOW (ref 3.5–5.0)
Alkaline Phosphatase: 77 U/L (ref 38–126)
Anion gap: 7 (ref 5–15)
BUN: 6 mg/dL (ref 6–20)
CO2: 21 mmol/L — ABNORMAL LOW (ref 22–32)
Calcium: 7.8 mg/dL — ABNORMAL LOW (ref 8.9–10.3)
Chloride: 111 mmol/L (ref 101–111)
Creatinine, Ser: 0.75 mg/dL (ref 0.44–1.00)
GFR calc Af Amer: >60 mL/min (ref >60)
GFR calc non Af Amer: >60 mL/min (ref >60)
Glucose, Bld: 103 mg/dL — ABNORMAL HIGH (ref 65–99)
Potassium: 3.3 mmol/L — ABNORMAL LOW (ref 3.5–5.1)
Sodium: 139 mmol/L (ref 135–145)
Total Bilirubin: 0.9 mg/dL (ref 0.3–1.2)
Total Protein: 5.7 g/dL — ABNORMAL LOW (ref 6.5–8.1)

## 2016-10-25 LAB — CBC
HCT: 32.3 % — ABNORMAL LOW (ref 36.0–46.0)
Hemoglobin: 10.3 g/dL — ABNORMAL LOW (ref 12.0–15.0)
MCH: 29.6 pg (ref 26.0–34.0)
MCHC: 31.9 g/dL (ref 30.0–36.0)
MCV: 92.8 fL (ref 78.0–100.0)
Platelets: 151 10*3/uL (ref 150–400)
RBC: 3.48 MIL/uL — ABNORMAL LOW (ref 3.87–5.11)
RDW: 13.9 % (ref 11.5–15.5)
WBC: 6.6 10*3/uL (ref 4.0–10.5)

## 2016-10-25 LAB — SURGICAL PCR SCREEN
MRSA, PCR: NEGATIVE
Staphylococcus aureus: POSITIVE — AB

## 2016-10-25 LAB — LIPASE, BLOOD: Lipase: 27 U/L (ref 11–51)

## 2016-10-25 SURGERY — LAPAROSCOPIC CHOLECYSTECTOMY WITH INTRAOPERATIVE CHOLANGIOGRAM
Anesthesia: General | Site: Abdomen

## 2016-10-25 MED ORDER — MUPIROCIN 2 % EX OINT
1.0000 "application " | TOPICAL_OINTMENT | Freq: Two times a day (BID) | CUTANEOUS | Status: DC
  Administered 2016-10-25 – 2016-10-26 (×3): 1 via NASAL
  Filled 2016-10-25: qty 22

## 2016-10-25 MED ORDER — PROMETHAZINE HCL 25 MG/ML IJ SOLN
INTRAMUSCULAR | Status: AC
  Filled 2016-10-25: qty 1

## 2016-10-25 MED ORDER — 0.9 % SODIUM CHLORIDE (POUR BTL) OPTIME
TOPICAL | Status: DC | PRN
  Administered 2016-10-25: 1000 mL

## 2016-10-25 MED ORDER — BUPIVACAINE-EPINEPHRINE 0.25% -1:200000 IJ SOLN
INTRAMUSCULAR | Status: DC | PRN
  Administered 2016-10-25: 24 mL

## 2016-10-25 MED ORDER — PROPOFOL 10 MG/ML IV BOLUS
INTRAVENOUS | Status: DC | PRN
  Administered 2016-10-25: 50 mg via INTRAVENOUS
  Administered 2016-10-25: 150 mg via INTRAVENOUS

## 2016-10-25 MED ORDER — CHLORHEXIDINE GLUCONATE CLOTH 2 % EX PADS
6.0000 | MEDICATED_PAD | Freq: Every day | CUTANEOUS | Status: DC
  Administered 2016-10-25 – 2016-10-26 (×2): 6 via TOPICAL

## 2016-10-25 MED ORDER — METOPROLOL TARTRATE 5 MG/5ML IV SOLN
INTRAVENOUS | Status: DC | PRN
  Administered 2016-10-25: 2 mg via INTRAVENOUS

## 2016-10-25 MED ORDER — HYDROMORPHONE HCL 1 MG/ML IJ SOLN: 0.2500 mg | INTRAMUSCULAR | Status: DC | PRN

## 2016-10-25 MED ORDER — SODIUM CHLORIDE 0.9 % IV SOLN
INTRAVENOUS | Status: DC
  Administered 2016-10-25 – 2016-10-26 (×2): via INTRAVENOUS

## 2016-10-25 MED ORDER — FENTANYL CITRATE (PF) 250 MCG/5ML IJ SOLN
INTRAMUSCULAR | Status: AC
  Filled 2016-10-25: qty 5

## 2016-10-25 MED ORDER — PROMETHAZINE HCL 25 MG/ML IJ SOLN
6.2500 mg | INTRAMUSCULAR | Status: DC | PRN
  Administered 2016-10-25: 6.25 mg via INTRAVENOUS

## 2016-10-25 MED ORDER — HYDROMORPHONE HCL 1 MG/ML IJ SOLN
INTRAMUSCULAR | Status: AC
  Filled 2016-10-25: qty 1

## 2016-10-25 MED ORDER — LACTATED RINGERS IV SOLN
INTRAVENOUS | Status: DC | PRN
  Administered 2016-10-25: 11:00:00 via INTRAVENOUS

## 2016-10-25 MED ORDER — LACTATED RINGERS IV SOLN
INTRAVENOUS | Status: DC
  Administered 2016-10-25: 10:00:00 via INTRAVENOUS

## 2016-10-25 MED ORDER — DEXAMETHASONE SODIUM PHOSPHATE 10 MG/ML IJ SOLN
INTRAMUSCULAR | Status: DC | PRN
  Administered 2016-10-25: 10 mg via INTRAVENOUS

## 2016-10-25 MED ORDER — BUPIVACAINE-EPINEPHRINE (PF) 0.25% -1:200000 IJ SOLN
INTRAMUSCULAR | Status: AC
  Filled 2016-10-25: qty 30

## 2016-10-25 MED ORDER — SODIUM CHLORIDE 0.9 % IR SOLN
Status: DC | PRN
  Administered 2016-10-25: 1000 mL

## 2016-10-25 MED ORDER — LIDOCAINE HCL (CARDIAC) 20 MG/ML IV SOLN
INTRAVENOUS | Status: DC | PRN
  Administered 2016-10-25: 60 mg via INTRAVENOUS

## 2016-10-25 MED ORDER — PROPOFOL 10 MG/ML IV BOLUS
INTRAVENOUS | Status: AC
  Filled 2016-10-25: qty 20

## 2016-10-25 MED ORDER — FENTANYL CITRATE (PF) 100 MCG/2ML IJ SOLN
INTRAMUSCULAR | Status: DC | PRN
  Administered 2016-10-25 (×2): 100 ug via INTRAVENOUS
  Administered 2016-10-25: 50 ug via INTRAVENOUS

## 2016-10-25 MED ORDER — ROCURONIUM BROMIDE 100 MG/10ML IV SOLN
INTRAVENOUS | Status: DC | PRN
  Administered 2016-10-25: 50 mg via INTRAVENOUS

## 2016-10-25 MED ORDER — SUGAMMADEX SODIUM 200 MG/2ML IV SOLN
INTRAVENOUS | Status: DC | PRN
  Administered 2016-10-25: 200 mg via INTRAVENOUS

## 2016-10-25 MED ORDER — TRAMADOL HCL 50 MG PO TABS
50.0000 mg | ORAL_TABLET | Freq: Four times a day (QID) | ORAL | Status: DC | PRN
Start: 2016-10-25 — End: 2016-10-26
  Administered 2016-10-25: 50 mg via ORAL
  Filled 2016-10-25: qty 1

## 2016-10-25 MED ORDER — ONDANSETRON HCL 4 MG/2ML IJ SOLN
INTRAMUSCULAR | Status: DC | PRN
  Administered 2016-10-25: 4 mg via INTRAVENOUS

## 2016-10-25 SURGICAL SUPPLY — 47 items
ADH SKN CLS APL DERMABOND .7 (GAUZE/BANDAGES/DRESSINGS) ×1
APPLIER CLIP 5 13 M/L LIGAMAX5 (MISCELLANEOUS) ×2
APR CLP MED LRG 5 ANG JAW (MISCELLANEOUS) ×1
BAG SPEC RTRVL 10 TROC 200 (ENDOMECHANICALS) ×1
BLADE CLIPPER SURG (BLADE) IMPLANT
CANISTER SUCT 3000ML PPV (MISCELLANEOUS) ×2 IMPLANT
CHLORAPREP W/TINT 26ML (MISCELLANEOUS) ×2 IMPLANT
CLIP APPLIE 5 13 M/L LIGAMAX5 (MISCELLANEOUS) ×1 IMPLANT
COVER SURGICAL LIGHT HANDLE (MISCELLANEOUS) ×2 IMPLANT
DERMABOND ADVANCED (GAUZE/BANDAGES/DRESSINGS) ×1
DERMABOND ADVANCED .7 DNX12 (GAUZE/BANDAGES/DRESSINGS) ×1 IMPLANT
ELECT CAUTERY BLADE 6.4 (BLADE) ×1 IMPLANT
ELECT REM PT RETURN 9FT ADLT (ELECTROSURGICAL) ×2
ELECTRODE REM PT RTRN 9FT ADLT (ELECTROSURGICAL) ×1 IMPLANT
FILTER SMOKE EVAC LAPAROSHD (FILTER) IMPLANT
GLOVE BIO SURGEON STRL SZ8 (GLOVE) ×2 IMPLANT
GLOVE BIOGEL PI IND STRL 6.5 (GLOVE) IMPLANT
GLOVE BIOGEL PI IND STRL 8 (GLOVE) ×1 IMPLANT
GLOVE BIOGEL PI INDICATOR 6.5 (GLOVE) ×1
GLOVE BIOGEL PI INDICATOR 8 (GLOVE) ×2
GLOVE INDICATOR 6.5 STRL GRN (GLOVE) ×1 IMPLANT
GLOVE SURG SS PI 7.5 STRL IVOR (GLOVE) ×1 IMPLANT
GOWN STRL REUS W/ TWL LRG LVL3 (GOWN DISPOSABLE) ×2 IMPLANT
GOWN STRL REUS W/ TWL XL LVL3 (GOWN DISPOSABLE) ×1 IMPLANT
GOWN STRL REUS W/TWL LRG LVL3 (GOWN DISPOSABLE) ×2
GOWN STRL REUS W/TWL XL LVL3 (GOWN DISPOSABLE) ×4
KIT BASIN OR (CUSTOM PROCEDURE TRAY) ×2 IMPLANT
KIT ROOM TURNOVER OR (KITS) ×2 IMPLANT
L-HOOK LAP DISP 36CM (ELECTROSURGICAL) ×2
LHOOK LAP DISP 36CM (ELECTROSURGICAL) ×1 IMPLANT
NEEDLE 22X1 1/2 (OR ONLY) (NEEDLE) ×2 IMPLANT
NS IRRIG 1000ML POUR BTL (IV SOLUTION) ×2 IMPLANT
PAD ARMBOARD 7.5X6 YLW CONV (MISCELLANEOUS) ×2 IMPLANT
PENCIL BUTTON HOLSTER BLD 10FT (ELECTRODE) ×2 IMPLANT
POUCH RETRIEVAL ECOSAC 10 (ENDOMECHANICALS) ×1 IMPLANT
POUCH RETRIEVAL ECOSAC 10MM (ENDOMECHANICALS) ×1
SCISSORS LAP 5X35 DISP (ENDOMECHANICALS) ×2 IMPLANT
SET IRRIG TUBING LAPAROSCOPIC (IRRIGATION / IRRIGATOR) ×2 IMPLANT
SLEEVE ENDOPATH XCEL 5M (ENDOMECHANICALS) ×4 IMPLANT
SPECIMEN JAR SMALL (MISCELLANEOUS) ×2 IMPLANT
SUT VIC AB 4-0 PS2 27 (SUTURE) ×2 IMPLANT
TOWEL OR 17X24 6PK STRL BLUE (TOWEL DISPOSABLE) ×2 IMPLANT
TOWEL OR 17X26 10 PK STRL BLUE (TOWEL DISPOSABLE) ×2 IMPLANT
TRAY LAPAROSCOPIC MC (CUSTOM PROCEDURE TRAY) ×2 IMPLANT
TROCAR XCEL BLUNT TIP 100MML (ENDOMECHANICALS) ×2 IMPLANT
TROCAR XCEL NON-BLD 5MMX100MML (ENDOMECHANICALS) ×2 IMPLANT
TUBING INSUFFLATION (TUBING) ×2 IMPLANT

## 2016-10-25 NOTE — Anesthesia Preprocedure Evaluation (Addendum)
Anesthesia Evaluation  Patient identified by MRN, date of birth, ID band Patient awake    Reviewed: Allergy & Precautions, H&P , NPO status , Patient's Chart, lab work & pertinent test results, reviewed documented beta blocker date and time   History of Anesthesia Complications (+) PONV  Airway Mallampati: I  TM Distance: >3 FB Neck ROM: Full    Dental  (+) Dental Advisory Given, Caps, Missing,    Pulmonary former smoker,    breath sounds clear to auscultation       Cardiovascular hypertension, Pt. on medications and Pt. on home beta blockers  Rhythm:Regular Rate:Normal  ECG: SR, rate 85. RBBB   Neuro/Psych  Headaches, PSYCHIATRIC DISORDERS Anxiety Depression  Neuromuscular disease    GI/Hepatic hiatal hernia, GERD  Medicated and Controlled,  Endo/Other  Morbid obesity  Renal/GU      Musculoskeletal RLS (restless legs syndrome)   Abdominal (+) + obese,   Peds  Hematology  (+) anemia ,   Anesthesia Other Findings Hyperlipidemia  Reproductive/Obstetrics                            Anesthesia Physical  Anesthesia Plan  ASA: III  Anesthesia Plan: General   Post-op Pain Management:    Induction: Intravenous  PONV Risk Score and Plan: 4 or greater and Ondansetron and Dexamethasone  Airway Management Planned: Oral ETT  Additional Equipment:   Intra-op Plan:   Post-operative Plan: Extubation in OR  Informed Consent: I have reviewed the patients History and Physical, chart, labs and discussed the procedure including the risks, benefits and alternatives for the proposed anesthesia with the patient or authorized representative who has indicated his/her understanding and acceptance.   Dental advisory given  Plan Discussed with: CRNA  Anesthesia Plan Comments:        Anesthesia Quick Evaluation

## 2016-10-25 NOTE — Op Note (Signed)
10/22/2016 - 10/25/2016  12:39 PM  PATIENT:  [name]  71 y.o. female  PRE-OPERATIVE DIAGNOSIS:  Biliary Pancreatitis  POST-OPERATIVE DIAGNOSIS:  Biliary Pancreatitis  PROCEDURE:  Procedure(s): LAPAROSCOPIC CHOLECYSTECTOMY  SURGEON:  Surgeon(s): Georganna Skeans, MD  ASSISTANTS: none   ANESTHESIA:   local and general  EBL:  No intake/output data recorded.  BLOOD ADMINISTERED:none  DRAINS: none   SPECIMEN:  Excision  DISPOSITION OF SPECIMEN:  PATHOLOGY  COUNTS:  YES  DICTATION: .Dragon Dictation Findings: Short cystic duct, replaced right hepatic artery  Procedure in detail: [name] presents for cholecystectomy. She was identified in the preop holding area. Informed consent was obtained. She received intravenous antibiotics. She was brought to the operating room and general endotracheal anesthesia was administered by the anesthesia staff. Her abdomen was prepped and draped in a sterile fashion. We did a time out procedure.The infraumbilical region was infiltrated with local. Infraumbilical incision was made. Subcutaneous tissues were dissected down revealing the anterior fascia. This was divided sharply along the midline. Peritoneal cavity was entered under direct vision without complication. A 0 Vicryl pursestring was placed around the fascial opening. Hassan trocar was inserted into the abdomen. The abdomen was insufflated with carbon dioxide in standard fashion. Under direct vision a 5 mm epigastric and 5 mm right abdominal port 2 were placed. Local was used at each port site. Laparoscopic exploration revealed some evidence of chronic inflammation of her gallbladder. The dome the gallbladder was retracted superior medially. The infundibulum had some filmy omental adhesions. These were swept away. Dissection began laterally and progressed medially. Identified that she had a replaced right hepatic artery. I stayed well away from this. Dissection of the cystic duct and cystic artery  then was completed. The cystic artery was clipped twice proximally, once distally and divided. The cystic duct was noted to be quite short, 10 mm or so. There was a critical view of safety achieved between the cystic duct, the liver, and gallbladder. The cystic duct was too short pharmacological therapy. 3 clips were placed proximally on it and one was placed distally and it was divided. The gallbladder was taken off the liver bed using Bovie cautery carefully avoiding the replaced right hepatic artery down below. The gallbladder was placed in a bag and removed from the abdomen. It was sent to pathology. The liver bed was cauterized getting excellent hemostasis. The area was copiously irrigated. Clips were rechecked and remained in good position. The liver bed was dry. Irrigation fluid was evacuated. Pneumoperitoneum was released. Ports were removed under direct vision. [facility] trocar was removed and the infraumbilical fascia was closed by tying the pursestring suture. All 4 wounds were irrigated and the skin of each was closed with running 4-0 Vicryl subcuticular followed by Dermabond. All counts were correct. She tolerated the procedure well without apparent complications and was taken recovery in stable condition.  PATIENT DISPOSITION:  PACU - hemodynamically stable.   Delay start of Pharmacological VTE agent (>24hrs) due to surgical blood loss or risk of bleeding:  no  Georganna Skeans, MD, MPH, FACS Pager: [phone]  9/17/201812:39 PM

## 2016-10-25 NOTE — Progress Notes (Signed)
Day of Surgery   Subjective/Chief Complaint: Epigastric pain has improved   Objective: Vital signs in last 24 hours: Temp:  [98.4 F (36.9 C)-99.2 F (37.3 C)] 98.4 F (36.9 C) (09/17 0441) Pulse Rate:  [81-92] 92 (09/17 0441) Resp:  [16-17] 17 (09/17 0441) BP: (128-149)/(62-71) 149/69 (09/17 0441) SpO2:  [95 %-96 %] 95 % (09/17 0441) Last BM Date: 10/23/16  Intake/Output from previous day: 09/16 0701 - 09/17 0700 In: 3860 [P.O.:860; I.V.:3000] Out: 1700 [Urine:1700] Intake/Output this shift: No intake/output data recorded.  General appearance: cooperative Resp: clear to auscultation bilaterally Cardio: regular rate and rhythm GI: soft, mild epig tenderness, +BS  Lab Results:   Recent Labs  10/24/16 0412 10/25/16 0354  WBC 10.1 6.6  HGB 10.4* 10.3*  HCT 33.3* 32.3*  PLT 197 151   BMET  Recent Labs  10/24/16 0412 10/25/16 0354  NA 137 139  K 3.5 3.3*  CL 111 111  CO2 19* 21*  GLUCOSE 67 103*  BUN 12 6  CREATININE 0.83 0.75  CALCIUM 7.9* 7.8*   PT/INR No results for input(s): LABPROT, INR in the last 72 hours. ABG No results for input(s): PHART, HCO3 in the last 72 hours.  Invalid input(s): PCO2, PO2  Studies/Results: No results found.  Anti-infectives: Anti-infectives    Start     Dose/Rate Route Frequency Ordered Stop   10/25/16 0600  ceFAZolin (ANCEF) IVPB 2g/100 mL premix     2 g 200 mL/hr over 30 Minutes Intravenous To Short Stay 10/24/16 2245 10/26/16 0600      Assessment/Plan: Biliary pancreatitis - for lap chole/IOC today. I again discussed the procedure, risks, and benefits with her and she agrees. Ancef on call to OR.  LOS: 2 days    Jeston Junkins E 10/25/2016

## 2016-10-25 NOTE — Consult Note (Addendum)
[facility] Primary Care Navigator  10/25/2016  [name] [facility] 01/28/1946 [id number]   Met with patientat the bedside to identify possible discharge needs.  Patientreports having abdominal pain, nausea/ vomiting and diarrhea thathad led to this admission/ surgery.  Patient endorses [name] as theprimary care provider.  [facility] [place] [facility]. Patient shares that son [name]) hasbeenmanaging her medications at home using "pill box" system filled monthly. Patient reportsthat she has been driving prior to admission/ surgery but son will beprovidingtransportation to herdoctors' appointments (will schedule appointments around son's schedule) after discharge.  Son lives with patient who will be her primary caregiver at home when needed.  Anticipated discharge plan is home according to patient.  Patientexpressed understanding to follow-up with primary care provider when she returnshome for a post discharge follow-up appointment within a week or sooner if needed. Patient letter (with PCP's contact number) was provided as a reminder.  Explained to patientabout [facility] CM services available for health management at home but she deniesany needs or concerns at this time. Patienthad opted and verbally agreed for [facility] calls tofollow-up with recovery at home.   Referral to [facility] calls made after discharge.  [facility] care management information provided for future needs that she may have.  For questions, please contact:  [name], BSN, RN- [facility] Primary Care Navigator  Telephone: [phone] [place]

## 2016-10-25 NOTE — Progress Notes (Signed)
[facility]                                                                              Patient Demographics  [name], is a 71 y.o. female, DOB - 1945/09/15, WCB:[id number]  Admit date - 10/22/2016   Admitting Physician Alexa Morton, MD  Outpatient Primary MD for the patient is Alexa Branch, MD  Outpatient specialists:   LOS - 2  days   Medical records reviewed and are as summarized below:    Chief Complaint  Patient presents with  . Abdominal Pain  . GI Bleeding       Brief summary   Patient is a 21 year oldfemale with hypertension, hyperlipidemia, Barrett's esophagus, C. Difficile, depression, GERD presented with nausea, vomiting, intermittent abdominal pain since July 2018. She was being evaluated by [name] of GI in the outpatient setting. CT abdomen showed pancreatitis with cholelithiasis, no acute cholecystitis. lipase 835with elevated LFTs.    Assessment & [name]    Principal Problem:   Pancreatitis, acute with cholelithiasis - Patient presented with nausea, vomiting, abdominal pain, lipase 835 with transaminitis - CT abdomen showed pericholecystic fluid, pancreatitis involving portions of the head and uncinate process, wall thickening at the junction of second and third portions of duodenum likely due to an eye status secondary to adjacent pancreatitis, no abscess. - abdominal ultrasound showed cholelithiasis without acute cholecystitis, no biliary dilatation - NPO for laparoscopic cholecystectomy today  Active Problems:   Morbid obesity ([place]) - BMI 40.61, patient recommended diet and weight control    Leukocytosis - likely reactive due to acute pancreatitis, afebrile, resolved    Anxiety - currently stable, no SI or HI, continue Effexor, clonazepam as needed  GERD - continue PPI  Hypokalemia - will add Kdur after surgery  Code Status: full  DVT Prophylaxis:  Lovenox  Family Communication: Discussed in detail with the patient,  all imaging results, lab results explained to the patient    Disposition [name]:   Time Spent in minutes 25 minutes  Procedures:  CT abdomen Ultrasound abdomen  Consultants:   Gen. surgery  Antimicrobials:      Medications  Scheduled Meds: . Chlorhexidine Gluconate Cloth  6 each Topical Once  . [MAR Hold] Chlorhexidine Gluconate Cloth  6 each Topical Daily  . [MAR Hold] mirabegron ER  25 mg Oral Daily  . [MAR Hold] mupirocin ointment  1 application Nasal BID  . [MAR Hold] pantoprazole (PROTONIX) IV  40 mg Intravenous Q24H  . [MAR Hold] rOPINIRole  1 mg Oral BID  . [MAR Hold] traZODone  50 mg Oral QHS  . [MAR Hold] venlafaxine XR  225 mg Oral Q breakfast   Continuous Infusions: . sodium chloride 125 mL/hr at 10/25/16 0714  .  ceFAZolin (ANCEF) IV    . lactated ringers     PRN Meds:.[MAR Hold] clonazePAM, [MAR Hold] fentaNYL (SUBLIMAZE) injection, [MAR Hold] ondansetron **OR** [MAR Hold] ondansetron (ZOFRAN) IV, [MAR Hold] oxyCODONE-acetaminophen   Antibiotics   Anti-infectives    Start     Dose/Rate Route Frequency Ordered Stop   10/25/16 0600  ceFAZolin (ANCEF) IVPB 2g/100 mL premix  2 g 200 mL/hr over 30 Minutes Intravenous To Short Stay 10/24/16 2245 10/26/16 0600        Subjective:   [name] was seen and examined today. Minimal abdominal pain, no nausea or vomiting, planned surgery today. No fevers or chills. Patient denies dizziness, chest pain, shortness of breath, new weakness, numbess, tingling. No acute events overnight.    Objective:   Vitals:   10/24/16 1415 10/24/16 1742 10/24/16 2046 10/25/16 0441  BP: 130/64 128/62 138/71 (!) 149/69  Pulse: 83 81 87 92  Resp: 16 16 17 17   Temp: 98.5 F (36.9 C) 98.4 F (36.9 C) 99.2 F (37.3 C) 98.4 F (36.9 C)  TempSrc: Oral Oral Oral Oral  SpO2: 96% 96% 95% 95%  Weight:      Height:        Intake/Output Summary (Last 24 hours) at 10/25/16 1010 Last data filed at 10/25/16 0700  Gross per 24  hour  Intake          3619.99 ml  Output             1700 ml  Net          1919.99 ml     Wt Readings from Last 3 Encounters:  10/23/16 114.1 kg (251 lb 8 oz)  09/29/16 111.6 kg (246 lb)  09/15/16 112.4 kg (247 lb 12.8 oz)     Exam General: Alert and oriented x 3, NAD Eyes:  HEENT:   Cardiovascular: S1 S2 auscultated, no rubs, murmurs or gallops. Regular rate and rhythm. No pedal edema b/l Respiratory: Clear to auscultation bilaterally, no wheezing, rales or rhonchi Gastrointestinal: Soft, minimal tenderness, epigastric and RUQ, nondistended, + bowel sounds Ext: no pedal edema bilaterally Neuro:no new deficits Musculoskeletal: No digital cyanosis, clubbing Skin: No rashes Psych: Normal affect and demeanor, alert and oriented x3    Data Reviewed:  I have personally reviewed following labs and imaging studies  Micro Results Recent Results (from the past 240 hour(s))  Surgical pcr screen     Status: Abnormal   Collection Time: 10/24/16 11:02 PM  Result Value Ref Range Status   MRSA, PCR NEGATIVE NEGATIVE Final   Staphylococcus aureus POSITIVE (A) NEGATIVE Final    Comment: (NOTE) The Xpert SA Assay (FDA approved for NASAL specimens in patients 11 years of age and older), is one component of a comprehensive surveillance program. It is not intended to diagnose infection nor to guide or monitor treatment.     Radiology Reports Ct Abdomen Pelvis W Contrast  Result Date: 10/23/2016 CLINICAL DATA:  Abdominal pain and emesis. Elevated liver enzymes and lipase. EXAM: CT ABDOMEN AND PELVIS WITH CONTRAST TECHNIQUE: Multidetector CT imaging of the abdomen and pelvis was performed using the standard protocol following bolus administration of intravenous contrast. CONTRAST:  100 mL ISOVUE-300 IOPAMIDOL (ISOVUE-300) INJECTION 61% COMPARISON:  April 05, 2007 FINDINGS: Lower chest: There is left base atelectasis. Lung bases otherwise clear. Hepatobiliary: There are no focal liver  lesions appreciable. There is evidence suggesting a degree of pericholecystic fluid. The gallbladder wall does not appear appreciably thickened, however. There is no biliary duct dilatation. There is inflammatory change involving a portion of the posterior head and uncinate process of the pancreas with mild soft tissue stranding in this area and immediately adjacent to the pancreas. Pancreas elsewhere appears normal. There is no well-defined fluid or pseudocyst in the pancreas. There is no pancreatic mass or pancreatic duct dilatation. Spleen: No splenic lesions are evident. Adrenals/Urinary Tract: Adrenals  appear unremarkable bilaterally. Kidneys bilaterally show no evident mass or hydronephrosis on either side. There is no appreciable renal or ureteral calculus on either side. Urinary bladder is midline with wall thickness within normal limits. Stomach/Bowel: There is localized wall thickness at the junction of the second and third portions of duodenum, immediately adjacent to the apparent pancreatic inflammation. No other bowel wall thickening is noted. No bowel obstruction. No free air or portal venous air. Vascular/Lymphatic: There are foci of atherosclerotic calcification in the aorta. There is no abdominal aortic aneurysm. Major mesenteric vessels appear patent. There is no appreciable adenopathy in the abdomen or pelvis. Reproductive: Uterus is absent.  No pelvic mass. Other: Appendix is not appreciable. There is no periappendiceal region inflammation. No abscess or ascites is apparent in the abdomen or pelvis. There is a minimal ventral hernia containing only fat. Musculoskeletal: There is a disc spacer at L5-S1 causing susceptibility artifact. There is degenerative change in the lumbar spine. There are no blastic or lytic bone lesions. There are no intramuscular or abdominal wall lesions. IMPRESSION: 1. Suspect a degree of pericholecystic fluid. Gallbladder wall does not appear appreciably thickened,  however. Advise correlation with gallbladder ultrasound to further assess. 2. Evidence of pancreatitis involving portions of the head and uncinate process. There is localized inflammatory change with soft tissue stranding in this portion of the pancreatitis and immediately adjacent structures. Remainder the pancreas appears normal. No mass or pseudocyst. No pancreatic duct dilatation. No fluid seen in the peripancreatic area. 3. Wall thickening at the junction of the second and third portions of the duodenum, likely duodenitis secondary to adjacent pancreatitis. No other bowel wall thickening. No bowel obstruction. 4.  No abscess in the abdomen or pelvis. 5. No renal or ureteral calculus. No hydronephrosis on either side. 6.  Aortic atherosclerosis. 7.  Minimal ventral hernia containing only fat. 8.  Artifact from metallic disc spacer at U9-N2. Aortic Atherosclerosis (ICD10-I70.0). Electronically Signed   By: Lowella Grip III M.D.   On: 10/23/2016 01:16   US Abdomen Limited  Result Date: 10/22/2016 CLINICAL DATA:  Initial evaluation regarding fracture pain, elevated liver enzymes. EXAM: ULTRASOUND ABDOMEN LIMITED RIGHT UPPER QUADRANT COMPARISON:  Prior ultrasound from 10/07/2016. FINDINGS: Gallbladder: Scattered echogenic calculi present within the gallbladder lumen, largest measuring 5 mm. Gallbladder wall measured within normal limits at 2.1 mm. No free pericholecystic fluid. No sonographic Murphy sign elicited on exam. Common bile duct: Diameter: 4.1 mm Liver: No focal lesion identified. Within normal limits in parenchymal echogenicity. Portal vein is patent on color Doppler imaging with normal direction of blood flow towards the liver. IMPRESSION: 1. Cholelithiasis without additional sonographic features to suggest acute cholecystitis. 2. No biliary dilatation. Electronically Signed   By: Jeannine Boga M.D.   On: 10/22/2016 23:48   Dg Bone Density  Result Date: 10/04/2016 EXAM: DUAL X-RAY  ABSORPTIOMETRY (DXA) FOR BONE MINERAL DENSITY IMPRESSION: Referring Physician:  Alda Berthold PAZ PATIENT: Name: Pricilla, Moehle Patient ID: [id number] Birth Date: 1945/07/06 Height: 65.5 in. Sex: Female Measured: 10/04/2016 Weight: 246.8 lbs. Indications: Advanced Age, Caucasian, History of Osteopenia, Hysterectomy, Low Calcium Intake, Oophorectomy ( Bilateral), Post Menopausal, Recurrent Falls, Vitamin D Deficiency Fractures: Treatments: Estrogen ASSESSMENT: The BMD measured at Femur Neck Right is 0.775 g/cm2 with a T-score of -1.9. This patient is considered OSTEOPENIC according to [facility]) criteria. Site Region Measured Date Measured Age WHO YA BMD Classification T-score AP Spine L1-L4 10/04/2016 [age] years Normal 0.2 1.208 g/cm2 DualFemur Neck Right 10/04/2016 71.0  years Osteopenia -1.9 0.775 g/cm2 World Health Organization [facility]) criteria for post-menopausal, Caucasian Women: Normal       T-score at or above -1 SD Osteopenia   T-score between -1 and -2.5 SD Osteoporosis T-score at or below -2.5 SD RECOMMENDATION: [place] recommends that FDA-approved medical therapies be considered in postmenopausal women and men age 44 or older with a: 1. Hip or vertebral (clinical or morphometric) fracture. 2. T-score of < -2.5 at the spine or hip. 3. Ten-year fracture probability by FRAX of 3% or greater for hip fracture or 20% or greater for major osteoporotic fracture. All treatment decisions require clinical judgment and consideration of individual patient factors, including patient preferences, co-morbidities, previous drug use, risk factors not captured in the FRAX model (e.g. falls, vitamin D deficiency, increased bone turnover, interval significant decline in bone density) and possible under - or over-estimation of fracture risk by FRAX. All patients should ensure an adequate intake of dietary calcium (1200 mg/d) and vitamin D (800 IU daily) unless contraindicated. FOLLOW-UP: People  with diagnosed cases of osteoporosis or at high risk for fracture should have regular bone mineral density tests. For patients eligible for Medicare, routine testing is allowed once every 2 years. The testing frequency can be increased to one year for patients who have rapidly progressing disease, those who are receiving or discontinuing medical therapy to restore bone mass, or have additional risk factors. I have reviewed this report and agree with the above findings. Mark A. Thornton Papas, M.D. [facility] Patient: Mamie Laurel Referring Physician: Colon [name] Birth Date: Sep 04, 1945 [age] Patient ID: [id number] Height: 65.5 in. Weight: 246.8 lbs. Measured: 10/04/2016 2:43:04 PM (16 SP 2) Sex: Female Ethnicity: White Analyzed: 10/04/2016 2:43:40 PM (16 SP 2) FRAX* 10-year Probability of Fracture Based on femoral neck BMD: DualFemur (Right) Major Osteoporotic Fracture: 10.5% Hip Fracture:                2.0% Population:                  [place] (Caucasian) Risk Factors:                None *FRAX is a Materials engineer of the [employer] for Metabolic Bone Disease, a World Pharmacologist (WHO) [facility]. ASSESSMENT: The probability of a major osteoporotic fracture is 10.5% within the next ten years. The probability of a hip fracture is 2.0% within the next ten years. I have reviewed this report and agree with the above findings. Mark A. Thornton Papas, M.D. [facility] Electronically Signed   By: Lavonia Dana M.D.   On: 10/04/2016 15:45   US Abdomen Limited Ruq  Result Date: 10/07/2016 CLINICAL DATA:  Epigastric pain EXAM: ULTRASOUND ABDOMEN LIMITED RIGHT UPPER QUADRANT COMPARISON:  None. FINDINGS: Gallbladder: No gallstones or wall thickening visualized. No sonographic Murphy sign noted by sonographer. Common bile duct: Diameter: Normal caliber, 4 mm Liver: No focal lesion identified. Within normal limits in parenchymal echogenicity. Portal vein is patent on  color Doppler imaging with normal direction of blood flow towards the liver. IMPRESSION: Unremarkable right upper quadrant ultrasound. Electronically Signed   By: Rolm Baptise M.D.   On: 10/07/2016 11:53    Lab Data:  CBC:  Recent Labs Lab 10/22/16 1800 10/23/16 0431 10/24/16 0412 10/25/16 0354  WBC 11.0* 8.0 10.1 6.6  HGB 12.2 10.1* 10.4* 10.3*  HCT 38.1 32.7* 33.3* 32.3*  MCV 92.5 92.4  92.8 92.8  PLT 237 205 197 902   Basic Metabolic Panel:  Recent Labs Lab 10/22/16 1800 10/23/16 0431 10/24/16 0412 10/25/16 0354  NA 138 138 137 139  K 4.1 3.4* 3.5 3.3*  CL 107 111 111 111  CO2 25 23 19* 21*  GLUCOSE 110* 97 67 103*  BUN 16 15 12 6   CREATININE 1.01* 0.92 0.83 0.75  CALCIUM 8.6* 7.6* 7.9* 7.8*   GFR: Estimated Creatinine Clearance: 81.5 mL/min (by C-G formula based on SCr of 0.75 mg/dL). Liver Function Tests:  Recent Labs Lab 10/22/16 1800 10/23/16 0431 10/24/16 0412 10/25/16 0354  AST 380* 152* 71* 36  ALT 239* 159* 110* 74*  ALKPHOS 116 87 90 77  BILITOT 0.8 0.7 1.0 0.9  PROT 7.2 5.6* 6.0* 5.7*  ALBUMIN 3.6 2.9* 3.0* 2.8*    Recent Labs Lab 10/22/16 1800 10/23/16 0431 10/24/16 0412 10/25/16 0354  LIPASE 835* 189* 44 27   No results for input(s): AMMONIA in the last 168 hours. Coagulation Profile: No results for input(s): INR, PROTIME in the last 168 hours. Cardiac Enzymes: No results for input(s): CKTOTAL, CKMB, CKMBINDEX, TROPONINI in the last 168 hours. BNP (last 3 results) No results for input(s): PROBNP in the last 8760 hours. HbA1C: No results for input(s): HGBA1C in the last 72 hours. CBG: No results for input(s): GLUCAP in the last 168 hours. Lipid Profile: No results for input(s): CHOL, HDL, LDLCALC, TRIG, CHOLHDL, LDLDIRECT in the last 72 hours. Thyroid Function Tests: No results for input(s): TSH, T4TOTAL, FREET4, T3FREE, THYROIDAB in the last 72 hours. Anemia Panel: No results for input(s): VITAMINB12, FOLATE, FERRITIN, TIBC,  IRON, RETICCTPCT in the last 72 hours. Urine analysis:    Component Value Date/Time   COLORURINE YELLOW 10/23/2016 0626   APPEARANCEUR CLEAR 10/23/2016 0626   LABSPEC 1.040 (H) 10/23/2016 0626   PHURINE 5.0 10/23/2016 0626   GLUCOSEU NEGATIVE 10/23/2016 0626   GLUCOSEU NEGATIVE 07/12/2014 1310   HGBUR NEGATIVE 10/23/2016 0626   HGBUR negative 12/11/2007 1029   BILIRUBINUR NEGATIVE 10/23/2016 0626   BILIRUBINUR Negative 07/05/2014 1542   KETONESUR NEGATIVE 10/23/2016 0626   PROTEINUR NEGATIVE 10/23/2016 0626   UROBILINOGEN >=8.0 (A) 07/12/2014 1310   NITRITE NEGATIVE 10/23/2016 0626   LEUKOCYTESUR NEGATIVE 10/23/2016 0626     Ripudeep Rai M.D. [facility] 10/25/2016, 10:10 AM  Pager: [phone] Between 7am to 7pm - call Pager - [phone]  After 7pm go to [email or web address] - password [email or web address]  Call night coverage person covering after 7pm

## 2016-10-25 NOTE — Transfer of Care (Signed)
Immediate Anesthesia Transfer of Care Note  Patient: [name]  Procedure(s) Performed: Procedure(s): LAPAROSCOPIC CHOLECYSTECTOMY (N/A)  Patient Location: PACU  Anesthesia Type:General  Level of Consciousness: awake and alert   Airway & Oxygen Therapy: Patient Spontanous Breathing and Patient connected to nasal cannula oxygen  Post-op Assessment: Report given to RN, Post -op Vital signs reviewed and stable and Patient moving all extremities X 4  Post vital signs: Reviewed and stable  Last Vitals:  Vitals:   10/25/16 0441 10/25/16 1249  BP: (!) 149/69   Pulse: 92   Resp: 17   Temp: 36.9 C (!) (P) 36.3 C  SpO2: 95%     Last Pain:  Vitals:   10/25/16 0441  TempSrc: Oral  PainSc:       Patients Stated Pain Goal: 2 (41/63/84 5364)  Complications: No apparent anesthesia complications

## 2016-10-25 NOTE — Anesthesia Procedure Notes (Signed)
Procedure Name: Intubation Date/Time: 10/25/2016 11:28 AM Performed by: Gaylene Brooks Pre-anesthesia Checklist: Patient identified, Emergency Drugs available, Suction available and Patient being monitored Patient Re-evaluated:Patient Re-evaluated prior to induction Oxygen Delivery Method: Circle System Utilized Preoxygenation: Pre-oxygenation with 100% oxygen Induction Type: IV induction Ventilation: Mask ventilation without difficulty Laryngoscope Size: Miller and 2 Grade View: Grade I Tube type: Oral Tube size: 7.0 mm Number of attempts: 1 Airway Equipment and Method: Stylet and Oral airway Placement Confirmation: ETT inserted through vocal cords under direct vision,  positive ETCO2 and breath sounds checked- equal and bilateral Secured at: 22 cm Tube secured with: Tape Dental Injury: Teeth and Oropharynx as per pre-operative assessment

## 2016-10-26 ENCOUNTER — Encounter (HOSPITAL_COMMUNITY): Payer: Self-pay | Admitting: General Surgery

## 2016-10-26 LAB — COMPREHENSIVE METABOLIC PANEL
ALT: 224 U/L — ABNORMAL HIGH (ref 14–54)
AST: 286 U/L — ABNORMAL HIGH (ref 15–41)
Albumin: 2.6 g/dL — ABNORMAL LOW (ref 3.5–5.0)
Alkaline Phosphatase: 197 U/L — ABNORMAL HIGH (ref 38–126)
Anion gap: 8 (ref 5–15)
BUN: 5 mg/dL — ABNORMAL LOW (ref 6–20)
CO2: 23 mmol/L (ref 22–32)
Calcium: 7.9 mg/dL — ABNORMAL LOW (ref 8.9–10.3)
Chloride: 106 mmol/L (ref 101–111)
Creatinine, Ser: 0.91 mg/dL (ref 0.44–1.00)
GFR calc Af Amer: >60 mL/min (ref >60)
GFR calc non Af Amer: >60 mL/min (ref >60)
Glucose, Bld: 140 mg/dL — ABNORMAL HIGH (ref 65–99)
Potassium: 3.4 mmol/L — ABNORMAL LOW (ref 3.5–5.1)
Sodium: 137 mmol/L (ref 135–145)
Total Bilirubin: 0.7 mg/dL (ref 0.3–1.2)
Total Protein: 5.8 g/dL — ABNORMAL LOW (ref 6.5–8.1)

## 2016-10-26 MED ORDER — HYDROCODONE-ACETAMINOPHEN 10-325 MG PO TABS: 1.0000 | ORAL_TABLET | ORAL | 0 refills | Status: DC | PRN

## 2016-10-26 MED ORDER — POTASSIUM CHLORIDE CRYS ER 20 MEQ PO TBCR
40.0000 meq | EXTENDED_RELEASE_TABLET | Freq: Once | ORAL | Status: AC
  Administered 2016-10-26: 40 meq via ORAL
  Filled 2016-10-26: qty 2

## 2016-10-26 MED ORDER — ONDANSETRON 4 MG PO TBDP: 4.0000 mg | ORAL_TABLET | Freq: Three times a day (TID) | ORAL | 0 refills | Status: DC | PRN

## 2016-10-26 NOTE — Anesthesia Postprocedure Evaluation (Signed)
Anesthesia Post Note  Patient: [name]  Procedure(s) Performed: Procedure(s) (LRB): LAPAROSCOPIC CHOLECYSTECTOMY (N/A)     Patient location during evaluation: PACU Anesthesia Type: General Level of consciousness: awake and alert Pain management: pain level controlled Vital Signs Assessment: post-procedure vital signs reviewed and stable Respiratory status: spontaneous breathing, nonlabored ventilation, respiratory function stable and patient connected to nasal cannula oxygen Cardiovascular status: blood pressure returned to baseline and stable Postop Assessment: no apparent nausea or vomiting Anesthetic complications: no    Last Vitals:  Vitals:   10/26/16 0010 10/26/16 0421  BP: 127/72 132/69  Pulse: 78 67  Resp: 16 16  Temp: 36.9 C 36.5 C  SpO2: 94% 94%    Last Pain:  Vitals:   10/26/16 0421  TempSrc: Oral  PainSc:                  Karyl Kinnier Ellender

## 2016-10-26 NOTE — Progress Notes (Signed)
[facility] Progress Note  1 Day Post-Op  Subjective: CC:  Reports post-operative abdominal soreness. Reports pain is much different from when she came in. Pain worse with movement. Improved with PO pain meds. Tolerated dinner but reports decreased appetite. Denies fever, chills, nausea, or vomiting.  Objective: Vital signs in last 24 hours: Temp:  [97 F (36.1 C)-98.7 F (37.1 C)] 97.7 F (36.5 C) (09/18 0421) Pulse Rate:  [67-102] 67 (09/18 0421) Resp:  [13-24] 16 (09/18 0421) BP: (127-172)/(69-89) 132/69 (09/18 0421) SpO2:  [89 %-99 %] 94 % (09/18 0421) Last BM Date: 10/22/16  Intake/Output from previous day: 09/17 0701 - 09/18 0700 In: 3182.8 [P.O.:940; I.V.:2242.8] Out: 1900 [Urine:1850; Blood:50] Intake/Output this shift: No intake/output data recorded.  PE: Gen:  Alert, NAD, pleasant Pulm:  Normal effort Abd: Soft, appropriately tender, non-distended, bowel sounds present in all 4 quadrants, incisions C/D/I Skin: warm and dry, no rashes  Psych: A&Ox3   Lab Results:   Recent Labs  10/24/16 0412 10/25/16 0354  WBC 10.1 6.6  HGB 10.4* 10.3*  HCT 33.3* 32.3*  PLT 197 151   BMET  Recent Labs  10/25/16 0354 10/26/16 0444  NA 139 137  K 3.3* 3.4*  CL 111 106  CO2 21* 23  GLUCOSE 103* 140*  BUN 6 5*  CREATININE 0.75 0.91  CALCIUM 7.8* 7.9*   PT/INR No results for input(s): LABPROT, INR in the last 72 hours. CMP     Component Value Date/Time   NA 137 10/26/2016 0444   K 3.4 (L) 10/26/2016 0444   CL 106 10/26/2016 0444   CO2 23 10/26/2016 0444   GLUCOSE 140 (H) 10/26/2016 0444   GLUCOSE 97 08/04/2008   BUN 5 (L) 10/26/2016 0444   CREATININE 0.91 10/26/2016 0444   CALCIUM 7.9 (L) 10/26/2016 0444   PROT 5.8 (L) 10/26/2016 0444   ALBUMIN 2.6 (L) 10/26/2016 0444   AST 286 (H) 10/26/2016 0444   ALT 224 (H) 10/26/2016 0444   ALKPHOS 197 (H) 10/26/2016 0444   BILITOT 0.7 10/26/2016 0444   GFRNONAA >60 10/26/2016 0444   GFRAA >60  10/26/2016 0444   Lipase     Component Value Date/Time   LIPASE 27 10/25/2016 0354       Studies/Results: No results found.  Anti-infectives: Anti-infectives    Start     Dose/Rate Route Frequency Ordered Stop   10/25/16 0600  ceFAZolin (ANCEF) IVPB 2g/100 mL premix     2 g 200 mL/hr over 30 Minutes Intravenous To Short Stay 10/24/16 2245 10/25/16 1135     Assessment/Plan Biliary pancreatitis POD#1 s/p laparoscopic cholecystectomy 10/25/16 Dr. Georganna Skeans  Afebrile, VSS   Total bilirubin remains normal (0.7), LFT's elevated but this is expected given intraoperative liver cautery  Tolerating regular diet   Pain controlled with PO meds   Stable for discharge from surgical perspective. No role for post-op abx. Follow up provided and discharge instructions discussed with patient.   LOS: 3 days    [facility] 10/26/2016, 7:54 AM Pager: [phone] Consults: [phone] Mon-Fri 7:00 am-4:30 pm Sat-Sun 7:00 am-11:30 am

## 2016-10-26 NOTE — Progress Notes (Signed)
Pt for discharge going home her son will pick her up, next appointment, health teachings, prescriptions given, also given all her personal belongings, removed the peripheral IV line, no complain of pain, wound site no bleeding noted with skin glued.

## 2016-10-26 NOTE — Discharge Summary (Signed)
Physician Discharge Summary   Patient ID: SHARNA GABRYS MRN: [id number] DOB/AGE: 72-Apr-1947 72 y.o.  Admit date: 10/22/2016 Discharge date: 10/26/2016  Primary Care Physician:  Colon Branch, MD  Discharge Diagnoses:    . Acute gallstone Pancreatitis . Nausea and vomiting . Leukocytosis . Anxiety . Morbid obesity ([place]) . Cholelithiasis   Consults: Gen. surgery  Recommendations for Outpatient Follow-up:  1. Please repeat CBC/CMET at next visit   DIET: Heart healthy diet    Allergies:   Allergies  Allergen Reactions  . Dextromethorphan-Guaifenesin Nausea And Vomiting  . Morphine Nausea And Vomiting  . Penicillins Rash    "> 30 years ago; best I can remember it was just a light rash on my arm"     DISCHARGE MEDICATIONS: Current Discharge Medication List    START taking these medications   Details  ondansetron (ZOFRAN ODT) 4 MG disintegrating tablet Take 1 tablet (4 mg total) by mouth every 8 (eight) hours as needed for nausea or vomiting. Qty: 20 tablet, Refills: 0      CONTINUE these medications which have CHANGED   Details  HYDROcodone-acetaminophen (NORCO) 10-325 MG tablet Take 1 tablet by mouth every 4 (four) hours as needed for moderate pain or severe pain. For back pain Qty: 20 tablet, Refills: 0      CONTINUE these medications which have NOT CHANGED   Details  aspirin EC 81 MG tablet Take 81 mg by mouth daily.    benazepril (LOTENSIN) 20 MG tablet Take 1 tablet (20 mg total) by mouth daily. Qty: 90 tablet, Refills: 2    clonazePAM (KLONOPIN) 0.5 MG tablet Take 1 tablet (0.5 mg total) by mouth 3 (three) times daily as needed for anxiety. Qty: 60 tablet, Refills: 4    dicyclomine (BENTYL) 10 MG capsule Take 1 capsule (10 mg total) by mouth 4 (four) times daily -  before meals and at bedtime. Qty: 120 capsule, Refills: 11    mirabegron ER (MYRBETRIQ) 25 MG TB24 tablet Take 25 mg by mouth daily.    omeprazole (PRILOSEC) 40 MG capsule Take 1 capsule  (40 mg total) by mouth 2 (two) times daily. Qty: 180 capsule, Refills: 0    ranitidine (ZANTAC) 300 MG tablet Take 1 tablet (300 mg total) by mouth at bedtime. Qty: 90 tablet, Refills: 2    rOPINIRole (REQUIP) 1 MG tablet TAKE 1 TABLET (1 MG TOTAL) BY MOUTH 2 (TWO) TIMES DAILY. Qty: 60 tablet, Refills: 5    traZODone (DESYREL) 50 MG tablet Take 1-2 tablets (50-100 mg total) by mouth at bedtime. Qty: 60 tablet, Refills: 3    venlafaxine XR (EFFEXOR-XR) 75 MG 24 hr capsule Take 3 capsules (225 mg total) by mouth daily with breakfast. Qty: 270 capsule, Refills: 1         Brief H and P: For complete details please refer to admission H and P, but in brief  Patient is a 86 year oldfemale with hypertension, hyperlipidemia, Barrett's esophagus, C. Difficile, depression, GERD presented with nausea, vomiting, intermittent abdominal pain since July 2018. She was being evaluated by Dr. Fuller Plan of GI in the outpatient setting. CT abdomen showed pancreatitis with cholelithiasis, no acute cholecystitis. lipase 835with elevated LFTs.  Hospital Course:   Pancreatitis, acute with cholelithiasis - Patient presented with nausea, vomiting, abdominal pain, lipase 835 with transaminitis - CT abdomen showed pericholecystic fluid, pancreatitis involving portions of the head and uncinate process, wall thickening at the junction of second and third portions of duodenum likely due to an eye  status secondary to adjacent pancreatitis, no abscess. - abdominal ultrasound showed cholelithiasis without acute cholecystitis, no biliary dilatation - Gen. surgery was consulted and patient underwent laparoscopic cholecystectomy on 9/17 - Currently doing well, tolerating regular diet. She has been cleared from general surgery to be discharged home. - She takes hydrocodone at home for back pain chronically, given #20 tabs and recommended to follow up with PCP for refills as patient reported that she has only a few tabs  left.     Morbid obesity ([place]) - BMI 40.61, patient recommended diet and weight control    Leukocytosis - likely reactive due to acute pancreatitis, afebrile, resolved    Anxiety - currently stable, no SI or HI, continue Effexor, clonazepam as needed  GERD - continue PPI  Chronic back pain - Continue home dose of Norco, further refills per PCP   Day of Discharge  Doing well, no complaints, tolerating diet without any difficulty  BP 132/69 (BP Location: Left Arm)   Pulse 67   Temp 97.7 F (36.5 C) (Oral)   Resp 16   Ht 5\' 6"  (1.676 m)   Wt 114.1 kg (251 lb 8 oz)   SpO2 94%   BMI 40.59 kg/m   Physical Exam: General: Alert and awake oriented x3 not in any acute distress. HEENT: anicteric sclera, pupils reactive to light and accommodation CVS: S1-S2 clear no murmur rubs or gallops Chest: clear to auscultation bilaterally, no wheezing rales or rhonchi Abdomen: soft nontender, nondistended, normal bowel sounds, incision CDI Extremities: no cyanosis, clubbing or edema noted bilaterally Neuro: Cranial nerves II-XII intact, no focal neurological deficits   The results of significant diagnostics from this hospitalization (including imaging, microbiology, ancillary and laboratory) are listed below for reference.    LAB RESULTS: Basic Metabolic Panel:  Recent Labs Lab 10/25/16 0354 10/26/16 0444  NA 139 137  K 3.3* 3.4*  CL 111 106  CO2 21* 23  GLUCOSE 103* 140*  BUN 6 5*  CREATININE 0.75 0.91  CALCIUM 7.8* 7.9*   Liver Function Tests:  Recent Labs Lab 10/25/16 0354 10/26/16 0444  AST 36 286*  ALT 74* 224*  ALKPHOS 77 197*  BILITOT 0.9 0.7  PROT 5.7* 5.8*  ALBUMIN 2.8* 2.6*    Recent Labs Lab 10/24/16 0412 10/25/16 0354  LIPASE 44 27   No results for input(s): AMMONIA in the last 168 hours. CBC:  Recent Labs Lab 10/24/16 0412 10/25/16 0354  WBC 10.1 6.6  HGB 10.4* 10.3*  HCT 33.3* 32.3*  MCV 92.8 92.8  PLT 197 151   Cardiac  Enzymes: No results for input(s): CKTOTAL, CKMB, CKMBINDEX, TROPONINI in the last 168 hours. BNP: Invalid input(s): POCBNP CBG: No results for input(s): GLUCAP in the last 168 hours.  Significant Diagnostic Studies:  Ct Abdomen Pelvis W Contrast  Result Date: 10/23/2016 CLINICAL DATA:  Abdominal pain and emesis. Elevated liver enzymes and lipase. EXAM: CT ABDOMEN AND PELVIS WITH CONTRAST TECHNIQUE: Multidetector CT imaging of the abdomen and pelvis was performed using the standard protocol following bolus administration of intravenous contrast. CONTRAST:  100 mL ISOVUE-300 IOPAMIDOL (ISOVUE-300) INJECTION 61% COMPARISON:  April 05, 2007 FINDINGS: Lower chest: There is left base atelectasis. Lung bases otherwise clear. Hepatobiliary: There are no focal liver lesions appreciable. There is evidence suggesting a degree of pericholecystic fluid. The gallbladder wall does not appear appreciably thickened, however. There is no biliary duct dilatation. There is inflammatory change involving a portion of the posterior head and uncinate process of the pancreas  with mild soft tissue stranding in this area and immediately adjacent to the pancreas. Pancreas elsewhere appears normal. There is no well-defined fluid or pseudocyst in the pancreas. There is no pancreatic mass or pancreatic duct dilatation. Spleen: No splenic lesions are evident. Adrenals/Urinary Tract: Adrenals appear unremarkable bilaterally. Kidneys bilaterally show no evident mass or hydronephrosis on either side. There is no appreciable renal or ureteral calculus on either side. Urinary bladder is midline with wall thickness within normal limits. Stomach/Bowel: There is localized wall thickness at the junction of the second and third portions of duodenum, immediately adjacent to the apparent pancreatic inflammation. No other bowel wall thickening is noted. No bowel obstruction. No free air or portal venous air. Vascular/Lymphatic: There are foci of  atherosclerotic calcification in the aorta. There is no abdominal aortic aneurysm. Major mesenteric vessels appear patent. There is no appreciable adenopathy in the abdomen or pelvis. Reproductive: Uterus is absent.  No pelvic mass. Other: Appendix is not appreciable. There is no periappendiceal region inflammation. No abscess or ascites is apparent in the abdomen or pelvis. There is a minimal ventral hernia containing only fat. Musculoskeletal: There is a disc spacer at L5-S1 causing susceptibility artifact. There is degenerative change in the lumbar spine. There are no blastic or lytic bone lesions. There are no intramuscular or abdominal wall lesions. IMPRESSION: 1. Suspect a degree of pericholecystic fluid. Gallbladder wall does not appear appreciably thickened, however. Advise correlation with gallbladder ultrasound to further assess. 2. Evidence of pancreatitis involving portions of the head and uncinate process. There is localized inflammatory change with soft tissue stranding in this portion of the pancreatitis and immediately adjacent structures. Remainder the pancreas appears normal. No mass or pseudocyst. No pancreatic duct dilatation. No fluid seen in the peripancreatic area. 3. Wall thickening at the junction of the second and third portions of the duodenum, likely duodenitis secondary to adjacent pancreatitis. No other bowel wall thickening. No bowel obstruction. 4.  No abscess in the abdomen or pelvis. 5. No renal or ureteral calculus. No hydronephrosis on either side. 6.  Aortic atherosclerosis. 7.  Minimal ventral hernia containing only fat. 8.  Artifact from metallic disc spacer at I4-P3. Aortic Atherosclerosis (ICD10-I70.0). Electronically Signed   By: Lowella Grip III M.D.   On: 10/23/2016 01:16   US Abdomen Limited  Result Date: 10/22/2016 CLINICAL DATA:  Initial evaluation regarding fracture pain, elevated liver enzymes. EXAM: ULTRASOUND ABDOMEN LIMITED RIGHT UPPER QUADRANT COMPARISON:   Prior ultrasound from 10/07/2016. FINDINGS: Gallbladder: Scattered echogenic calculi present within the gallbladder lumen, largest measuring 5 mm. Gallbladder wall measured within normal limits at 2.1 mm. No free pericholecystic fluid. No sonographic Murphy sign elicited on exam. Common bile duct: Diameter: 4.1 mm Liver: No focal lesion identified. Within normal limits in parenchymal echogenicity. Portal vein is patent on color Doppler imaging with normal direction of blood flow towards the liver. IMPRESSION: 1. Cholelithiasis without additional sonographic features to suggest acute cholecystitis. 2. No biliary dilatation. Electronically Signed   By: Jeannine Boga M.D.   On: 10/22/2016 23:48    2D ECHO:   Disposition and Follow-up: Discharge Instructions    Diet - low sodium heart healthy    Complete by:  As directed    Increase activity slowly    Complete by:  As directed        DISPOSITION: [facility] Follow up.   Specialty:  General Surgery Why:  Our office is  scheduling you a follow up appointment. Please call as soon as possible to confirm appointment date/time.  Contact information: [address] [place], [place]. Schedule an appointment as soon as possible for a visit in 10 day(s).   Specialty:  Internal Medicine Contact information: [address] [place] [address] [phone]            Time spent on Discharge: 7mins   Signed:   Estill Cotta M.D. [facility] 10/26/2016, 10:43 AM Pager: [phone]

## 2016-10-26 NOTE — Evaluation (Signed)
Physical Therapy Evaluation and Discharge Patient Details Name: [name] MRN: [id number] DOB: [birth year] Today's Date: 10/26/2016   History of Present Illness  Pt is a 71 y/o female admitted secondary to Pancreatitis and acute cholelithiasis; s/p laparascopic cholecystectomy 9/17. PMH includes anxiety, depression, HTN, osteoporosis, restless leg syndrome, and R TKA.   Clinical Impression  Patient evaluated by Physical Therapy with no further acute PT needs identified. All education has been completed and the patient has no further questions. Pt supervision with mobility this session. Reports her son and neighbors can check on pt at d/c. Reports she feels she does not need any further PT services. See below for any follow-up Physical Therapy or equipment needs. PT is signing off. Thank you for this referral. Please re-consult if needs change.      Follow Up Recommendations No PT follow up    Equipment Recommendations  None recommended by PT    Recommendations for Other Services       Precautions / Restrictions Precautions Precautions: None Restrictions Weight Bearing Restrictions: No      Mobility  Bed Mobility               General bed mobility comments: In chair upon entry. Discussed log roll technique with pt to decrease pain in abdomen.    Transfers Overall transfer level: Needs assistance Equipment used: None Transfers: Sit to/from Stand Sit to Stand: Supervision         General transfer comment: Supervision for safety   Ambulation/Gait Ambulation/Gait assistance: Supervision Ambulation Distance (Feet): 500 Feet Assistive device: None Gait Pattern/deviations: Step-through pattern;Decreased stride length Gait velocity: WFL  Gait velocity interpretation: at or above normal speed for age/gender General Gait Details: Overall steady gait. Slight LOB initially secondary to L foot pain at baseline, however, pt able to self correct. Pt able to perform  horizontal/vertical head turns, however, did experience some dizziness. Discussed if dizziness becomes a problem, to see MD for possible vertigo. Discussed role of PT in treating vertigo.   Stairs            Wheelchair Mobility    Modified Rankin (Stroke Patients Only)       Balance Overall balance assessment: No apparent balance deficits (not formally assessed)                               Standardized Balance Assessment Standardized Balance Assessment : Dynamic Gait Index   Dynamic Gait Index Level Surface: Normal Gait with Horizontal Head Turns: Normal Gait with Vertical Head Turns: Normal       Pertinent Vitals/Pain Pain Assessment: 0-10 Pain Score: 3  Pain Location: abdomen Pain Descriptors / Indicators: Aching;Operative site guarding Pain Intervention(s): Limited activity within patient's tolerance;Monitored during session;Repositioned    Home Living Family/patient expects to be discharged to:: Private residence Living Arrangements: Children Available Help at Discharge: Family;Available PRN/intermittently;Neighbor Type of Home: House Home Access: [place]: One [place]: Environmental consultant - 2 wheels;Cane - single point      Prior Function Level of Independence: Independent               Hand Dominance   Dominant Hand: Right    Extremity/Trunk Assessment   Upper Extremity Assessment Upper Extremity Assessment: Overall WFL for tasks assessed    Lower Extremity Assessment Lower Extremity Assessment: LLE deficits/detail;Overall WFL for tasks assessed LLE Deficits / Details: L foot pain that  occurs at baseline.     Cervical / Trunk Assessment Cervical / Trunk Assessment: Normal  Communication   Communication: No difficulties  Cognition Arousal/Alertness: Awake/alert Behavior During Therapy: WFL for tasks assessed/performed Overall Cognitive Status: Within Functional Limits for tasks assessed                                         General Comments General comments (skin integrity, edema, etc.): Discussed using pillow to brace abdomen when coughing or sneezing.     Exercises     Assessment/Plan    PT Assessment Patent does not need any further PT services  PT Problem List         PT Treatment Interventions      PT Goals (Current goals can be found in the Care Plan section)  Acute Rehab PT Goals Patient Stated Goal: to go home today  PT Goal Formulation: With patient Time For Goal Achievement: 10/26/16 Potential to Achieve Goals: Good    Frequency     Barriers to discharge        Co-evaluation               AM-PAC PT "6 Clicks" Daily Activity  Outcome Measure Difficulty turning over in bed (including adjusting bedclothes, sheets and blankets)?: None Difficulty moving from lying on back to sitting on the side of the bed? : None Difficulty sitting down on and standing up from a chair with arms (e.g., wheelchair, bedside commode, etc,.)?: None Help needed moving to and from a bed to chair (including a wheelchair)?: None Help needed walking in hospital room?: None Help needed climbing 3-5 steps with a railing? : A Little 6 Click Score: 23    End of Session   Activity Tolerance: Patient tolerated treatment well Patient left: in chair;with call bell/phone within reach Nurse Communication: Mobility status PT Visit Diagnosis: Other abnormalities of gait and mobility (R26.89)    Time: 6837-2902 PT Time Calculation (min) (ACUTE ONLY): 18 min   Charges:   PT Evaluation $PT Eval Low Complexity: 1 Low     PT G Codes:        Leighton Ruff, PT, DPT  Acute Rehabilitation Services  Pager: [phone]   Rudean Hitt 10/26/2016, 11:26 AM

## 2016-10-26 NOTE — Discharge Instructions (Signed)

## 2016-10-27 ENCOUNTER — Telehealth: Payer: Self-pay | Admitting: Behavioral Health

## 2016-10-27 NOTE — Telephone Encounter (Signed)
Transition Care Management Follow-up Telephone Call  Admit date: 10/22/2016 Discharge date: 10/26/2016  Primary Care Physician:  Colon Branch, MD  Discharge Diagnoses:    . Acute gallstone Pancreatitis . Nausea and vomiting . Leukocytosis . Anxiety . Morbid obesity ([place]) . Cholelithiasis   Consults: Gen. surgery  Recommendations for Outpatient Follow-up:  1. Please repeat CBC/CMET at next visit   How have you been since you were released from the hospital? Patient stated, "I'm doing just fine".   Do you understand why you were in the hospital? yes   Do you understand the discharge instructions? yes   Where were you discharged to? Home   Items Reviewed:  Medications reviewed: yes  Allergies reviewed: yes  Dietary changes reviewed: yes, patient voiced that she's consuming a regular diet.  Referrals reviewed: yes, follow-up with PCP in 10 days; follow-up with surgeon.   Functional Questionnaire:   Activities of Daily Living (ADLs):   She states they are independent in the following: ambulation, bathing and hygiene, feeding, continence, grooming, toileting and dressing States they require assistance with the following: None   Any transportation issues/concerns?: no   Any patient concerns? no   Confirmed importance and date/time of follow-up visits scheduled yes, 11/05/16 at 11:30 AM.  Provider Appointment booked with Dr. Larose Kells.  Confirmed with patient if condition begins to worsen call PCP or go to the ER.  Patient was given the office number and encouraged to call back with question or concerns.  : yes

## 2016-10-27 NOTE — Telephone Encounter (Signed)
TCM/Hospital Follow-up appointment has been scheduled for 11/05/16 at 11:30 AM with Dr. Larose Kells.

## 2016-10-29 ENCOUNTER — Ambulatory Visit: Payer: Self-pay | Admitting: Psychology

## 2016-10-29 ENCOUNTER — Telehealth: Payer: Self-pay | Admitting: Gastroenterology

## 2016-10-31 ENCOUNTER — Other Ambulatory Visit: Payer: Self-pay | Admitting: Internal Medicine

## 2016-11-01 ENCOUNTER — Other Ambulatory Visit: Payer: Self-pay | Admitting: Gastroenterology

## 2016-11-01 ENCOUNTER — Other Ambulatory Visit: Payer: Self-pay

## 2016-11-01 NOTE — Patient Outreach (Signed)
[facility]) Care Management  11/01/2016  Davida Falconi [facility] Mar 02, 1945 [id number]  EMMI: General dishcarge Referral date: 11/01/16 Referral source: EMMI general discharge red alert Referral reason: Scheduled follow up: YES Day # 1  Telephone call to patient regarding EMMI general discharge red alert. HIPAA verified with patient. Discussed EMMI general discharge program with patient. Patient states she has scheduled her follow up appointment with her primary MD for Friday 11/05/16. Patient states she has a follow up scheduled with her surgeon on 11/09/16.  Patient states she has all of her medications and takes them as prescribed. Patient reports having transportation to her appointments.  RNCM reviewed sign/s symptoms of infection with patient. Patient denies having any signs of infection at her surgical site.  RNCM advised patient to notify MD of any changes in condition prior to scheduled appointment. RNCM provided contact name and number: [phone] or main office number [phone] and 24 hour nurse advise line [phone].  RNCM verified patient aware of 911 services for urgent/ emergent needs. Patient denies having any additional needs at  This time. Patient verbally agreed to received [facility] care management outreach letter/ brochure for future use.   ASSESSMENT:  Per patients MEDICAL RECORD NUMBER Admit date: 10/22/2016 Discharge date: 10/26/2016  Primary Care Physician:  Colon Branch, MD  Discharge Diagnoses:    . Acute gallstone Pancreatitis . Nausea and vomiting . Leukocytosis . Anxiety . Morbid obesity ([place]) . Cholelithiasis  PLAN:  RNCM will refer patient care management assistant to close due to patient being assessed and having no further needs.  RNCM will notify patient primary MD of closure.  RNCM will send patient [facility] care management outreach letter/ brochure.   Quinn Plowman RN,BSN,CCM [facility] Telephonic  [phone]

## 2016-11-01 NOTE — Telephone Encounter (Signed)
Patient notified

## 2016-11-05 ENCOUNTER — Ambulatory Visit (HOSPITAL_BASED_OUTPATIENT_CLINIC_OR_DEPARTMENT_OTHER)
Admission: RE | Admit: 2016-11-05 | Discharge: 2016-11-05 | Disposition: A | Discharge status: Home / Self Care | Payer: Medicare Other | Source: Ambulatory Visit | Attending: Internal Medicine | Admitting: Internal Medicine

## 2016-11-05 ENCOUNTER — Telehealth: Payer: Self-pay

## 2016-11-05 ENCOUNTER — Encounter: Payer: Self-pay | Admitting: Internal Medicine

## 2016-11-05 ENCOUNTER — Encounter (HOSPITAL_BASED_OUTPATIENT_CLINIC_OR_DEPARTMENT_OTHER): Payer: Self-pay

## 2016-11-05 ENCOUNTER — Ambulatory Visit (INDEPENDENT_AMBULATORY_CARE_PROVIDER_SITE_OTHER): Payer: Medicare Other | Admitting: Internal Medicine

## 2016-11-05 VITALS — BP 128/64 | HR 107 | Temp 98.2°F | Resp 14 | Ht 66.0 in | Wt 245.0 lb

## 2016-11-05 DIAGNOSIS — J9811 Atelectasis: Secondary | ICD-10-CM | POA: Diagnosis not present

## 2016-11-05 DIAGNOSIS — Z9049 Acquired absence of other specified parts of digestive tract: Secondary | ICD-10-CM

## 2016-11-05 DIAGNOSIS — R7989 Other specified abnormal findings of blood chemistry: Secondary | ICD-10-CM | POA: Diagnosis not present

## 2016-11-05 DIAGNOSIS — K8020 Calculus of gallbladder without cholecystitis without obstruction: Secondary | ICD-10-CM | POA: Diagnosis not present

## 2016-11-05 DIAGNOSIS — R0602 Shortness of breath: Secondary | ICD-10-CM | POA: Insufficient documentation

## 2016-11-05 DIAGNOSIS — I2510 Atherosclerotic heart disease of native coronary artery without angina pectoris: Secondary | ICD-10-CM | POA: Insufficient documentation

## 2016-11-05 DIAGNOSIS — M47814 Spondylosis without myelopathy or radiculopathy, thoracic region: Secondary | ICD-10-CM | POA: Insufficient documentation

## 2016-11-05 DIAGNOSIS — K851 Biliary acute pancreatitis without necrosis or infection: Secondary | ICD-10-CM | POA: Diagnosis not present

## 2016-11-05 DIAGNOSIS — I700 Atherosclerosis of aorta: Secondary | ICD-10-CM | POA: Diagnosis not present

## 2016-11-05 DIAGNOSIS — R911 Solitary pulmonary nodule: Secondary | ICD-10-CM | POA: Diagnosis not present

## 2016-11-05 LAB — COMPREHENSIVE METABOLIC PANEL
ALT: 17 U/L (ref 0–35)
AST: 15 U/L (ref 0–37)
Albumin: 3.8 g/dL (ref 3.5–5.2)
Alkaline Phosphatase: 93 U/L (ref 39–117)
BUN: 12 mg/dL (ref 6–23)
CO2: 26 mEq/L (ref 19–32)
Calcium: 9.1 mg/dL (ref 8.4–10.5)
Chloride: 101 mEq/L (ref 96–112)
Creatinine, Ser: 0.92 mg/dL (ref 0.40–1.20)
GFR: 63.76 mL/min (ref >60.00)
Glucose, Bld: 105 mg/dL — ABNORMAL HIGH (ref 70–99)
Potassium: 4 mEq/L (ref 3.5–5.1)
Sodium: 136 mEq/L (ref 135–145)
Total Bilirubin: 0.6 mg/dL (ref 0.2–1.2)
Total Protein: 7.1 g/dL (ref 6.0–8.3)

## 2016-11-05 LAB — CBC WITH DIFFERENTIAL/PLATELET
Basophils Absolute: 0.1 10*3/uL (ref 0.0–0.1)
Basophils Relative: 0.6 % (ref 0.0–3.0)
Eosinophils Absolute: 0.3 10*3/uL (ref 0.0–0.7)
Eosinophils Relative: 3.9 % (ref 0.0–5.0)
HCT: 37 % (ref 36.0–46.0)
Hemoglobin: 12.1 g/dL (ref 12.0–15.0)
Lymphocytes Relative: 27.7 % (ref 12.0–46.0)
Lymphs Abs: 2.5 10*3/uL (ref 0.7–4.0)
MCHC: 32.7 g/dL (ref 30.0–36.0)
MCV: 91.4 fl (ref 78.0–100.0)
Monocytes Absolute: 0.5 10*3/uL (ref 0.1–1.0)
Monocytes Relative: 6.1 % (ref 3.0–12.0)
Neutro Abs: 5.5 10*3/uL (ref 1.4–7.7)
Neutrophils Relative %: 61.7 % (ref 43.0–77.0)
Platelets: 285 10*3/uL (ref 150.0–400.0)
RBC: 4.05 Mil/uL (ref 3.87–5.11)
RDW: 14.5 % (ref 11.5–15.5)
WBC: 8.8 10*3/uL (ref 4.0–10.5)

## 2016-11-05 LAB — D-DIMER, QUANTITATIVE (NOT AT ARMC): D-Dimer, Quant: 2.07 mcg/mL FEU — ABNORMAL HIGH (ref <0.50)

## 2016-11-05 MED ORDER — IOPAMIDOL (ISOVUE-370) INJECTION 76%
100.0000 mL | Freq: Once | INTRAVENOUS | Status: AC | PRN
  Administered 2016-11-05: 100 mL via INTRAVENOUS

## 2016-11-05 NOTE — Telephone Encounter (Signed)
See office visit from today: Chest x-ray was okay, labs okay except for (+) d-dimer. Arrange a CT chest angiogram, DX shortness of breath, recent surgery, rule out PE.

## 2016-11-05 NOTE — Progress Notes (Signed)
Subjective:    Patient ID: [name], female    DOB: May 17, 1945, 71 y.o.   MRN: [id number]  DOS:  11/05/2016 Type of visit - description : TCM 14 Interval history: Admitted to the hospital 10/22/2016, discharged 4 days later. Went to the ER with nausea, vomiting or abdominal pain, a CT showed pancreatitis with cholelithiasis, no acute cholecystitis. Lipase- LFTs were elevated but they trended down by the time of discharge. Had a lap cholecystectomy 10/25/2016   She was discharged on essentially the same medications as well as zofran Needs a CBC and CMP  Review of Systems Since he left the hospital with abdominal pain is much improved, appetite is still not back to baseline but she is tolerating a bland diet. She did report some shortness of breath, sometimes worse when she sits down and better when she lays down, states that sx sometimes with her position; it is associated somewhat with upper mid back pain. Also reports some dyspnea on exertion. Denies fever chills No SSCP, no lower extremity edema or palpitations. Had some nausea but that is decreasing. No vomiting, no blood in the stools, no cough.   Past Medical History:  Diagnosis Date  . Anemia   . Anxiety   . Barrett's esophagus   . Bilateral carpal tunnel syndrome    Dr. Eddie Dibbles, having injection therapy  . C. difficile diarrhea 08/01/2012   severe 2014  . Chronic cystitis    Dr. Matilde Sprang  . Chronic lower back pain   . Depression   . DJD (degenerative joint disease)    bilateral hands; knees  . Family history of anesthesia complication    Mother had severe N/V  . GERD (gastroesophageal reflux disease)   . H/O cardiac catheterization    (-) cath 12-2002  , cath again 2011 (-)  . HTN (hypertension)   . Hyperlipidemia   . Insomnia   . Migraine   . Osteoporosis    pt unsure of this  . RLS (restless legs syndrome)   . Vitamin B 12 deficiency 04/09/2013    Past Surgical History:  Procedure Laterality Date  .  Arm surgery Left    "don't remember what they did; arm wasn't broken"  . BILATERAL KNEE ARTHROSCOPY Bilateral   . CARDIAC CATHETERIZATION  01/22/10   clean cath  . CATARACT EXTRACTION W/ INTRAOCULAR LENS  IMPLANT, BILATERAL Bilateral   . CHOLECYSTECTOMY N/A 10/25/2016   Procedure: LAPAROSCOPIC CHOLECYSTECTOMY;  Surgeon: Georganna Skeans, MD;  Location: [place];  Service: General;  Laterality: N/A;  . COLONOSCOPY W/ POLYPECTOMY    . FOOT SURGERY Bilateral    toenails removed; callus removed on right; hammertoes right"  . Knot     "removed from right neck; not a goiter"  . LUMBAR DISC SURGERY     L5 S1 anterior fusion  . OOPHORECTOMY    . SHOULDER ARTHROSCOPY Right   . TOTAL KNEE ARTHROPLASTY  10/05/2011   Procedure: TOTAL KNEE ARTHROPLASTY;  Surgeon: Hessie Dibble, MD;  Location: [place];  Service: Orthopedics;  Laterality: Right;  [name] VAGINAL HYSTERECTOMY     for endometriosis    Social History   Social History  . Marital status: Divorced    Spouse name: N/A  . Number of children: 1  . Years of education: N/A   Occupational History  . Retired, post office  Retired   Social History Main Topics  . Smoking status: Former Smoker    Packs/day: 0.50    Years: 10.00  Quit date: 02/09/1975  . Smokeless tobacco: Never Used     Comment: started at age 45.   quit in the 1970s.  . Alcohol use 0.0 oz/week     Comment: 10/05/2011 "socially;  wine or mixed drink at the most once/month"  . Drug use: No     Comment: CBD OIL   . Sexual activity: No   Other Topics Concern  . Not on file   Social History Narrative   Son lives w/ her       Allergies as of 11/05/2016      Reactions   Dextromethorphan-guaifenesin Nausea And Vomiting   Morphine Nausea And Vomiting   Penicillins Rash   "> 30 years ago; best I can remember it was just a light rash on my arm"      Medication List       Accurate as of 11/05/16 11:59 PM. Always use your most recent med list.          aspirin EC 81 MG  tablet Take 81 mg by mouth daily.   benazepril 20 MG tablet Commonly known as:  LOTENSIN Take 1 tablet (20 mg total) by mouth daily.   clonazePAM 0.5 MG tablet Commonly known as:  KLONOPIN Take 1 tablet (0.5 mg total) by mouth 3 (three) times daily as needed for anxiety.   HYDROcodone-acetaminophen 10-325 MG tablet Commonly known as:  NORCO Take 1 tablet by mouth every 4 (four) hours as needed for moderate pain or severe pain. For back pain   MYRBETRIQ 25 MG Tb24 tablet Generic drug:  mirabegron ER Take 25 mg by mouth every other day.   omeprazole 40 MG capsule Commonly known as:  PRILOSEC TAKE 1 CAPSULE (40 MG TOTAL) BY MOUTH 2 (TWO) TIMES DAILY.   ondansetron 4 MG disintegrating tablet Commonly known as:  ZOFRAN ODT Take 1 tablet (4 mg total) by mouth every 8 (eight) hours as needed for nausea or vomiting.   ranitidine 300 MG tablet Commonly known as:  ZANTAC Take 1 tablet (300 mg total) by mouth at bedtime.   rOPINIRole 1 MG tablet Commonly known as:  REQUIP TAKE 1 TABLET (1 MG TOTAL) BY MOUTH 2 (TWO) TIMES DAILY.   traZODone 50 MG tablet Commonly known as:  DESYREL Take 1-2 tablets (50-100 mg total) by mouth at bedtime.   venlafaxine XR 75 MG 24 hr capsule Commonly known as:  EFFEXOR-XR Take 3 capsules (225 mg total) by mouth daily with breakfast.            Discharge Care Instructions        Start     Ordered   11/05/16 0000  Comp Met (CMET)     11/05/16 1156   11/05/16 0000  CBC w/Diff     11/05/16 1156   11/05/16 0000  D-Dimer, Quantitative     11/05/16 1156   11/05/16 0000  DG Chest 2 View    Question Answer Comment  Reason for Exam (SYMPTOM  OR DIAGNOSIS REQUIRED) SOB, recent cholecystectomy on 10/25/2016   Preferred imaging location? [facility]      11/05/16 1156   11/05/16 0000  CT Angio Chest W/Cm &/Or Wo Cm    Comments:  Specimen [id number]: Hold patient and call report.  After hours, do on call doctor.    Question Answer  Comment  If indicated for the ordered procedure, I authorize the administration of contrast media per Radiology protocol Yes   Preferred imaging location? [facility]  Radiology Contrast Protocol - do NOT remove file path [email or web address]      11/05/16 1429         Objective:   Physical Exam BP 128/64 (BP Location: Left Arm, Patient Position: Sitting, Cuff Size: Normal)   Pulse (!) 107   Temp 98.2 F (36.8 C) (Oral)   Resp 14   Ht '5\' 6"'  (1.676 m)   Wt 245 lb (111.1 kg)   SpO2 97%   BMI 39.54 kg/m  General:   Well developed, overweight appearing . NAD.  HEENT:  Normocephalic . Face symmetric, atraumatic Lungs:  Decreased breath sounds, slightly worse on the right base Normal respiratory effort, no intercostal retractions, no accessory muscle use. Chest wall: Posterior right chest wall slightly TTP to palpation but normal to inspection. No bruising or crepitus noted. Heart: Slightly tachycardic,  no murmur.  no pretibial edema bilaterally . Calves symmetric Abdomen:  Not distended, soft, mild tenderness in the upper abdomen particularly in the epigastric area. No mass or rebound. Surgical scar seems to be healing well. Skin: Not pale. Not jaundice Neurologic:  alert & oriented X3.  Speech normal, gait appropriate for age and unassisted Psych--  Cognition and judgment appear intact.  Cooperative with normal attention span and concentration.  Behavior appropriate. No anxious or depressed appearing.    Assessment & Plan:    Assessment HTN Hyperlipidemia- Pravachol, Crestor: Myalgias. Intolerant to Zetia (joint aches), see OV  06/2016  Depression, Anxiety (on clonazepam), insomnia (on trazodone): depression x many  years, remotely on zoloft and other meds per psych, I rx lexapro 2015, then switch to effexor ; Morbid obesity GI:  --GERD, Barrett's esophagus, EGD 05-2015 --h/o persistent C. difficile diarrhea 2014 B12  deficiency Osteopenia - Bone density test 09-2013 showed osteopenia, on calcium, vitamin D RLS - Requip, see Pulmonary Migraines, f/u [facility] -- off topamax as off 06-2016 MSK: --Back pain, chronic: pain meds rx by ortho, Dr Eddie Dibbles retired, rx per Workers comp MD --DJD ----CTS B OAB, LUTS-- on myrbetriq Dr McDarmoth  H/o Cardiac catheterization 2011 and 2014 negative  PLAN Pancreatitis, cholelithiasis: S/p surgery, improving from the GI standpoint, will check a CBC, CMP. Has a follow-up with surgery in 4 days. Shortness of breath: The patient reports SOB and I did notice some TTP at the posterior right chest wall. She is a slightly tachycardic but otherwise in no distress. Given recent surgery I need to be suspicious for complications (PE, pnm, ATX, others). Will proceed with a chest x-ray and d-dimer, consider further eval. ER if symptoms severe. Addendum: CXR (-), Ddimer (+), CT chest angio w/ no P.E. RTC in a couple of weeks as a schedule.

## 2016-11-05 NOTE — Telephone Encounter (Signed)
D-Dimer elevated- 2.07

## 2016-11-05 NOTE — Patient Instructions (Addendum)
GO TO THE LAB : Get the blood work    STOP BY THE FIRST FLOOR:  get the XR   Continue same medications  If you have severe difficulty breathing, severe chest pain, leg swelling: Go to the ER

## 2016-11-05 NOTE — Progress Notes (Signed)
Pre visit review using our clinic review tool, if applicable. No additional management support is needed unless otherwise documented below in the visit note. 

## 2016-11-05 NOTE — Telephone Encounter (Signed)
CT ordered. Tried calling Pt, telephone busy. Spoke w/ Legrand Como, Pt's son, informed him of D-dimer results and recommendations. He informed he would let his Mom know to go to [facility] for CT chest angio.

## 2016-11-05 NOTE — Telephone Encounter (Signed)
CT scheduled today at 11/05/2016 at 3:40 PM.

## 2016-11-05 NOTE — Telephone Encounter (Signed)
Tried calling Pt to make sure she received message, call went straight to voicemail; voicemail box full, unable to leave message.

## 2016-11-06 NOTE — Assessment & Plan Note (Signed)
Pancreatitis, cholelithiasis: S/p surgery, improving from the GI standpoint, will check a CBC, CMP. Has a follow-up with surgery in 4 days. Shortness of breath: The patient reports SOB and I did notice some TTP at the posterior right chest wall. She is a slightly tachycardic but otherwise in no distress. Given recent surgery I need to be suspicious for complications (PE, pnm, ATX, others). Will proceed with a chest x-ray and d-dimer, consider further eval. ER if symptoms severe. Addendum: CXR (-), Ddimer (+), CT chest angio w/ no P.E. RTC in a couple of weeks as a schedule.

## 2016-11-08 ENCOUNTER — Telehealth: Payer: Self-pay | Admitting: *Deleted

## 2016-11-08 NOTE — Telephone Encounter (Signed)
Received results from [facility]; forwarded to provider/SLS 10/01

## 2016-11-12 ENCOUNTER — Ambulatory Visit (INDEPENDENT_AMBULATORY_CARE_PROVIDER_SITE_OTHER): Payer: Medicare Other | Admitting: Psychology

## 2016-11-12 ENCOUNTER — Ambulatory Visit (INDEPENDENT_AMBULATORY_CARE_PROVIDER_SITE_OTHER): Payer: Medicare Other | Admitting: Behavioral Health

## 2016-11-12 DIAGNOSIS — E538 Deficiency of other specified B group vitamins: Secondary | ICD-10-CM | POA: Diagnosis not present

## 2016-11-12 DIAGNOSIS — F332 Major depressive disorder, recurrent severe without psychotic features: Secondary | ICD-10-CM | POA: Diagnosis not present

## 2016-11-12 MED ORDER — CYANOCOBALAMIN 1000 MCG/ML IJ SOLN
1000.0000 ug | Freq: Once | INTRAMUSCULAR | Status: AC
  Administered 2016-11-12: 1000 ug via INTRAMUSCULAR

## 2016-11-12 NOTE — Progress Notes (Addendum)
Pre visit review using our clinic review tool, if applicable. No additional management support is needed unless otherwise documented below in the visit note.  Patient came in office for monthly B12 injection. IM injection was given in the left deltoid. Patient tolerated it well. Next appointment 12/14/16 at 2:15 PM. Kathlene November, MD

## 2016-11-15 ENCOUNTER — Encounter: Payer: Self-pay | Admitting: Internal Medicine

## 2016-11-15 ENCOUNTER — Ambulatory Visit (INDEPENDENT_AMBULATORY_CARE_PROVIDER_SITE_OTHER): Payer: Medicare Other | Admitting: Internal Medicine

## 2016-11-15 VITALS — BP 126/76 | HR 106 | Temp 98.1°F | Resp 14 | Ht 66.0 in | Wt 241.2 lb

## 2016-11-15 DIAGNOSIS — I10 Essential (primary) hypertension: Secondary | ICD-10-CM | POA: Diagnosis not present

## 2016-11-15 DIAGNOSIS — F329 Major depressive disorder, single episode, unspecified: Secondary | ICD-10-CM | POA: Diagnosis not present

## 2016-11-15 DIAGNOSIS — R0602 Shortness of breath: Secondary | ICD-10-CM | POA: Diagnosis not present

## 2016-11-15 DIAGNOSIS — F32A Depression, unspecified: Secondary | ICD-10-CM

## 2016-11-15 NOTE — Patient Instructions (Signed)
  GO TO THE FRONT DESK Schedule your next appointment for a  Physical exam in 2 or 3 months

## 2016-11-15 NOTE — Progress Notes (Signed)
Subjective:    Patient ID: [name], female    DOB: Sep 10, 1945, 71 y.o.   MRN: [id number]  DOS:  11/15/2016 Type of visit - description : rov Interval history: Since the last time she is doing okay. Still get some DOE, she feels related to deconditioning   Review of Systems  Denies abdominal pain or nausea. Since she had GB surgery, GERD symptoms have definitely decreased. She's also having early society and has lost some weight. Denies chest pain or lower extremity edema  Past Medical History:  Diagnosis Date  . Anemia   . Anxiety   . Barrett's esophagus   . Bilateral carpal tunnel syndrome    Dr. Eddie Dibbles, having injection therapy  . C. difficile diarrhea 08/01/2012   severe 2014  . Chronic cystitis    Dr. Matilde Sprang  . Chronic lower back pain   . Depression   . DJD (degenerative joint disease)    bilateral hands; knees  . Family history of anesthesia complication    Mother had severe N/V  . GERD (gastroesophageal reflux disease)   . H/O cardiac catheterization    (-) cath 12-2002  , cath again 2011 (-)  . HTN (hypertension)   . Hyperlipidemia   . Insomnia   . Migraine   . Osteoporosis    pt unsure of this  . RLS (restless legs syndrome)   . Vitamin B 12 deficiency 04/09/2013    Past Surgical History:  Procedure Laterality Date  . Arm surgery Left    "don't remember what they did; arm wasn't broken"  . BILATERAL KNEE ARTHROSCOPY Bilateral   . CARDIAC CATHETERIZATION  01/22/10   clean cath  . CATARACT EXTRACTION W/ INTRAOCULAR LENS  IMPLANT, BILATERAL Bilateral   . CHOLECYSTECTOMY N/A 10/25/2016   Procedure: LAPAROSCOPIC CHOLECYSTECTOMY;  Surgeon: Georganna Skeans, MD;  Location: [place];  Service: General;  Laterality: N/A;  . COLONOSCOPY W/ POLYPECTOMY    . FOOT SURGERY Bilateral    toenails removed; callus removed on right; hammertoes right"  . Knot     "removed from right neck; not a goiter"  . LUMBAR DISC SURGERY     L5 S1 anterior fusion  .  OOPHORECTOMY    . SHOULDER ARTHROSCOPY Right   . TOTAL KNEE ARTHROPLASTY  10/05/2011   Procedure: TOTAL KNEE ARTHROPLASTY;  Surgeon: Hessie Dibble, MD;  Location: [place];  Service: Orthopedics;  Laterality: Right;  [name] VAGINAL HYSTERECTOMY     for endometriosis    Social History   Social History  . Marital status: Divorced    Spouse name: N/A  . Number of children: 1  . Years of education: N/A   Occupational History  . Retired, post office  Retired   Social History Main Topics  . Smoking status: Former Smoker    Packs/day: 0.50    Years: 10.00    Quit date: 02/09/1975  . Smokeless tobacco: Never Used     Comment: started at age 55.   quit in the 1970s.  . Alcohol use 0.0 oz/week     Comment: 10/05/2011 "socially;  wine or mixed drink at the most once/month"  . Drug use: No     Comment: CBD OIL   . Sexual activity: No   Other Topics Concern  . Not on file   Social History Narrative   Son lives w/ her       Allergies as of 11/15/2016      Reactions   Dextromethorphan-guaifenesin Nausea And  Vomiting   Morphine Nausea And Vomiting   Penicillins Rash   "> 30 years ago; best I can remember it was just a light rash on my arm"      Medication List       Accurate as of 11/15/16 11:59 PM. Always use your most recent med list.          aspirin EC 81 MG tablet Take 81 mg by mouth daily.   benazepril 20 MG tablet Commonly known as:  LOTENSIN Take 1 tablet (20 mg total) by mouth daily.   clonazePAM 0.5 MG tablet Commonly known as:  KLONOPIN Take 1 tablet (0.5 mg total) by mouth 3 (three) times daily as needed for anxiety.   HYDROcodone-acetaminophen 10-325 MG tablet Commonly known as:  NORCO Take 1 tablet by mouth every 4 (four) hours as needed for moderate pain or severe pain. For back pain   MYRBETRIQ 25 MG Tb24 tablet Generic drug:  mirabegron ER Take 25 mg by mouth every other day.   omeprazole 40 MG capsule Commonly known as:  PRILOSEC Take 40 mg by mouth  daily.   ondansetron 4 MG disintegrating tablet Commonly known as:  ZOFRAN ODT Take 1 tablet (4 mg total) by mouth every 8 (eight) hours as needed for nausea or vomiting.   ranitidine 300 MG tablet Commonly known as:  ZANTAC Take 1 tablet (300 mg total) by mouth at bedtime.   rOPINIRole 1 MG tablet Commonly known as:  REQUIP TAKE 1 TABLET (1 MG TOTAL) BY MOUTH 2 (TWO) TIMES DAILY.   traZODone 50 MG tablet Commonly known as:  DESYREL Take 1-2 tablets (50-100 mg total) by mouth at bedtime.   venlafaxine XR 75 MG 24 hr capsule Commonly known as:  EFFEXOR-XR Take 3 capsules (225 mg total) by mouth daily with breakfast.          Objective:   Physical Exam BP 126/76 (BP Location: Left Arm, Patient Position: Sitting, Cuff Size: Normal)   Pulse (!) 106   Temp 98.1 F (36.7 C) (Oral)   Resp 14   Ht 5\' 6"  (1.676 m)   Wt 241 lb 4 oz (109.4 kg)   SpO2 97%   BMI 38.94 kg/m  General:   Well developed, well nourished . NAD.  HEENT:  Normocephalic . Face symmetric, atraumatic Lungs:  CTA B Normal respiratory effort, no intercostal retractions, no accessory muscle use. Heart: Regular, mildly tachycardic.,  no murmur.  no pretibial edema bilaterally  Abdomen:  Not distended, soft, minimal tender at mid abdomen without rebound or rigidity.  Skin: Not pale. Not jaundice Neurologic:  alert & oriented X3.  Speech normal, gait appropriate for age and unassisted Psych--  Cognition and judgment appear intact.  Cooperative with normal attention span and concentration.  Behavior appropriate. No anxious or depressed appearing.    Assessment & Plan:   Assessment HTN Hyperlipidemia- Pravachol, Crestor: Myalgias. Intolerant to Zetia (joint aches), see OV  06/2016  Depression, Anxiety (on clonazepam), insomnia (on trazodone): depression x many  years, remotely on zoloft and other meds per psych, I rx lexapro 2015, then switch to effexor ; Morbid obesity GI:  --GERD, Barrett's  esophagus, EGD 05-2015 --h/o persistent C. difficile diarrhea 2014 B12 deficiency Osteopenia - Bone density test 09-2013 showed osteopenia, on calcium, vitamin D RLS - Requip, see Pulmonary Migraines, f/u [facility] -- off topamax as off 06-2016 MSK: --Back pain, chronic: pain meds rx by ortho, Dr Eddie Dibbles retired, rx per Workers comp MD --DJD ----  CTS B OAB, LUTS-- on myrbetriq Dr McDarmoth  H/o Cardiac catheterization 2011 and 2014 negative  PLAN Pancreatitis: Resolved SOB: see last visit, CT negative for PE, continue with mild difficulty breathing, she thinks is d/t deconditioning, that is probably the case, recommend gradual exercise. HTN: Seems well-controlled, mildly tachycardic, not a new issue. Continue Lotensin. Anxiety depression and insomnia: Patient's son is not completely satisfied with the treatment however patient feels very comfortable with Effexor and she feels well. No change. Hardly ever uses trazodone. GERD: Sxs definitely decrease since she had the gallbladder, decrease omeprazole from BID to qd  RTC CPX 3 months.    9----- Pancreatitis, cholelithiasis: S/p surgery, improving from the GI standpoint, will check a CBC, CMP. Has a follow-up with surgery in 4 days. Shortness of breath: The patient reports SOB and I did notice some TTP at the posterior right chest wall. She is a slightly tachycardic but otherwise in no distress. Given recent surgery I need to be suspicious for complications (PE, pnm, ATX, others). Will proceed with a chest x-ray and d-dimer, consider further eval. ER if symptoms severe. Addendum: CXR (-), Ddimer (+), CT chest angio w/ no P.E. RTC in a couple of weeks as a schedule.

## 2016-11-15 NOTE — Progress Notes (Signed)
Pre visit review using our clinic review tool, if applicable. No additional management support is needed unless otherwise documented below in the visit note. 

## 2016-11-16 NOTE — Assessment & Plan Note (Signed)
Pancreatitis: Resolved SOB: see last visit, CT negative for PE, continue with mild difficulty breathing, she thinks is d/t deconditioning, that is probably the case, recommend gradual exercise. HTN: Seems well-controlled, mildly tachycardic, not a new issue. Continue Lotensin. Anxiety depression and insomnia: Patient's son is not completely satisfied with the treatment however patient feels very comfortable with Effexor and she feels well. No change. Hardly ever uses trazodone. GERD: Sxs definitely decrease since she had the gallbladder, decrease omeprazole from BID to qd  RTC CPX 3 months.

## 2016-11-26 ENCOUNTER — Ambulatory Visit (INDEPENDENT_AMBULATORY_CARE_PROVIDER_SITE_OTHER): Payer: Medicare Other | Admitting: Psychology

## 2016-11-26 DIAGNOSIS — F332 Major depressive disorder, recurrent severe without psychotic features: Secondary | ICD-10-CM | POA: Diagnosis not present

## 2016-11-27 ENCOUNTER — Other Ambulatory Visit: Payer: Self-pay | Admitting: Internal Medicine

## 2016-11-27 ENCOUNTER — Other Ambulatory Visit: Payer: Self-pay | Admitting: Adult Health

## 2016-12-08 DIAGNOSIS — M1712 Unilateral primary osteoarthritis, left knee: Secondary | ICD-10-CM | POA: Diagnosis not present

## 2016-12-10 ENCOUNTER — Ambulatory Visit (INDEPENDENT_AMBULATORY_CARE_PROVIDER_SITE_OTHER): Payer: Medicare Other | Admitting: Psychology

## 2016-12-10 DIAGNOSIS — F332 Major depressive disorder, recurrent severe without psychotic features: Secondary | ICD-10-CM

## 2016-12-14 ENCOUNTER — Ambulatory Visit (INDEPENDENT_AMBULATORY_CARE_PROVIDER_SITE_OTHER): Payer: Medicare Other

## 2016-12-14 DIAGNOSIS — E538 Deficiency of other specified B group vitamins: Secondary | ICD-10-CM

## 2016-12-14 MED ORDER — CYANOCOBALAMIN 1000 MCG/ML IJ SOLN
1000.0000 ug | Freq: Once | INTRAMUSCULAR | Status: AC
  Administered 2016-12-14: 1000 ug via INTRAMUSCULAR

## 2016-12-14 NOTE — Progress Notes (Signed)
Pre visit review using our clinic tool,if applicable. No additional management support is needed unless otherwise documented below in the visit note.   Patient in for B12 injection per order from Kathlene November, M.D. Due to patient having B12 deficiency.  No complaints voiced this visit. Given 1000 mcg IM Right Deltoid. Patient tolerated well.  Appointment scheduled for next injection on 01/13/17

## 2016-12-24 ENCOUNTER — Ambulatory Visit: Payer: Medicare Other | Admitting: Psychology

## 2016-12-29 ENCOUNTER — Other Ambulatory Visit: Payer: Self-pay | Admitting: Adult Health

## 2017-01-07 ENCOUNTER — Ambulatory Visit (INDEPENDENT_AMBULATORY_CARE_PROVIDER_SITE_OTHER): Payer: Medicare Other | Admitting: Psychology

## 2017-01-07 DIAGNOSIS — F332 Major depressive disorder, recurrent severe without psychotic features: Secondary | ICD-10-CM | POA: Diagnosis not present

## 2017-01-13 ENCOUNTER — Ambulatory Visit: Payer: Self-pay

## 2017-01-21 ENCOUNTER — Ambulatory Visit: Payer: Self-pay

## 2017-01-21 ENCOUNTER — Ambulatory Visit: Payer: Medicare Other | Admitting: Psychology

## 2017-01-31 ENCOUNTER — Telehealth: Payer: Self-pay | Admitting: Pulmonary Disease

## 2017-01-31 MED ORDER — ROPINIROLE HCL 1 MG PO TABS: ORAL_TABLET | ORAL | 0 refills | Status: DC

## 2017-01-31 NOTE — Telephone Encounter (Signed)
Spoke with the pt  I advised will go ahead and refill the requip x 1 only, needs to keep appt with VS for any additional refills  Rx was called to pharm (left on the VM)

## 2017-02-11 ENCOUNTER — Ambulatory Visit: Payer: Medicare Other | Admitting: Psychology

## 2017-02-14 ENCOUNTER — Encounter: Payer: Self-pay | Admitting: Internal Medicine

## 2017-02-14 ENCOUNTER — Ambulatory Visit (INDEPENDENT_AMBULATORY_CARE_PROVIDER_SITE_OTHER): Payer: Medicare Other | Admitting: Internal Medicine

## 2017-02-14 VITALS — BP 124/80 | HR 88 | Temp 98.1°F | Ht 66.0 in | Wt 243.0 lb

## 2017-02-14 DIAGNOSIS — R0602 Shortness of breath: Secondary | ICD-10-CM | POA: Diagnosis not present

## 2017-02-14 DIAGNOSIS — Z79899 Other long term (current) drug therapy: Secondary | ICD-10-CM | POA: Diagnosis not present

## 2017-02-14 DIAGNOSIS — F419 Anxiety disorder, unspecified: Secondary | ICD-10-CM

## 2017-02-14 DIAGNOSIS — E538 Deficiency of other specified B group vitamins: Secondary | ICD-10-CM

## 2017-02-14 MED ORDER — CYANOCOBALAMIN 1000 MCG/ML IJ SOLN
1000.0000 ug | Freq: Once | INTRAMUSCULAR | Status: AC
  Administered 2017-02-14: 1000 ug via INTRAMUSCULAR

## 2017-02-14 NOTE — Progress Notes (Signed)
Subjective:    Patient ID: [name], female    DOB: 1945/03/06, 72 y.o.   MRN: [id number]  DOS:  02/14/2017 Type of visit - description : ROV Interval history: No new concerns GERD: Continue to be well controlled despite decreasing PPIs Fatigue, S OB, DOE: On and off, sometimes, no worse than before.  Has not start a exercise program Depression: Patient herself is content with current treatment  Feet pain: Worse with walking, sometimes sharp, it can be at the site of the feet or the dorsum.  Wt Readings from Last 3 Encounters:  02/14/17 243 lb (110.2 kg)  11/15/16 241 lb 4 oz (109.4 kg)  11/05/16 245 lb (111.1 kg)     Review of Systems Denies chest pain, no lower extremity edema or palpitations. Admits to poor diet, weight has increased.   Past Medical History:  Diagnosis Date  . Anemia   . Anxiety   . Barrett's esophagus   . Bilateral carpal tunnel syndrome    Dr. Eddie Dibbles, having injection therapy  . C. difficile diarrhea 08/01/2012   severe 2014  . Chronic cystitis    Dr. Matilde Sprang  . Chronic lower back pain   . Depression   . DJD (degenerative joint disease)    bilateral hands; knees  . Family history of anesthesia complication    Mother had severe N/V  . GERD (gastroesophageal reflux disease)   . H/O cardiac catheterization    (-) cath 12-2002  , cath again 2011 (-)  . HTN (hypertension)   . Hyperlipidemia   . Insomnia   . Migraine   . Osteoporosis    pt unsure of this  . RLS (restless legs syndrome)   . Vitamin B 12 deficiency 04/09/2013    Past Surgical History:  Procedure Laterality Date  . Arm surgery Left    "don't remember what they did; arm wasn't broken"  . BILATERAL KNEE ARTHROSCOPY Bilateral   . CARDIAC CATHETERIZATION  01/22/10   clean cath  . CATARACT EXTRACTION W/ INTRAOCULAR LENS  IMPLANT, BILATERAL Bilateral   . CHOLECYSTECTOMY N/A 10/25/2016   Procedure: LAPAROSCOPIC CHOLECYSTECTOMY;  Surgeon: Georganna Skeans, MD;  Location: [facility];   Service: General;  Laterality: N/A;  . COLONOSCOPY W/ POLYPECTOMY    . FOOT SURGERY Bilateral    toenails removed; callus removed on right; hammertoes right"  . Knot     "removed from right neck; not a goiter"  . LUMBAR DISC SURGERY     L5 S1 anterior fusion  . OOPHORECTOMY    . SHOULDER ARTHROSCOPY Right   . TOTAL KNEE ARTHROPLASTY  10/05/2011   Procedure: TOTAL KNEE ARTHROPLASTY;  Surgeon: Hessie Dibble, MD;  Location: [facility];  Service: Orthopedics;  Laterality: Right;  [name] VAGINAL HYSTERECTOMY     for endometriosis    Social History   Socioeconomic History  . Marital status: Divorced    Spouse name: Not on file  . Number of children: 1  . Years of education: Not on file  . Highest education level: Not on file  Social Needs  . Financial resource strain: Not on file  . Food insecurity - worry: Not on file  . Food insecurity - inability: Not on file  . Transportation needs - medical: Not on file  . Transportation needs - non-medical: Not on file  Occupational History  . Occupation: Retired, post Ecologist: RETIRED  Tobacco Use  . Smoking status: Former Smoker    Packs/day:  0.50    Years: 10.00    Pack years: 5.00    Last attempt to quit: 02/09/1975    Years since quitting: 42.0  . Smokeless tobacco: Never Used  . Tobacco comment: started at age 57.   quit in the 67s.  Substance and Sexual Activity  . Alcohol use: Yes    Alcohol/week: 0.0 oz    Comment: 10/05/2011 "socially;  wine or mixed drink at the most once/month"  . Drug use: No    Comment: CBD OIL   . Sexual activity: No    Partners: Male  Other Topics Concern  . Not on file  Social History Narrative   Son lives w/ her       Allergies as of 02/14/2017      Reactions   Dextromethorphan-guaifenesin Nausea And Vomiting   Morphine Nausea And Vomiting   Penicillins Rash   "> 30 years ago; best I can remember it was just a light rash on my arm"      Medication List        Accurate as of 02/14/17  11:59 PM. Always use your most recent med list.          aspirin EC 81 MG tablet Take 81 mg by mouth daily.   benazepril 20 MG tablet Commonly known as:  LOTENSIN Take 1 tablet (20 mg total) by mouth daily.   clonazePAM 0.5 MG tablet Commonly known as:  KLONOPIN Take 1 tablet (0.5 mg total) by mouth 3 (three) times daily as needed for anxiety.   HYDROcodone-acetaminophen 10-325 MG tablet Commonly known as:  NORCO Take 1 tablet by mouth every 4 (four) hours as needed for moderate pain or severe pain. For back pain   rOPINIRole 1 MG tablet Commonly known as:  REQUIP TAKE 1 TABLET (1 MG TOTAL) BY MOUTH 2 (TWO) TIMES DAILY.   traZODone 50 MG tablet Commonly known as:  DESYREL Take 1-2 tablets (50-100 mg total) by mouth at bedtime.   venlafaxine XR 75 MG 24 hr capsule Commonly known as:  EFFEXOR-XR Take 3 capsules (225 mg total) by mouth daily with breakfast.          Objective:   Physical Exam BP 124/80   Pulse 88   Temp 98.1 F (36.7 C) (Oral)   Ht 5\' 6"  (1.676 m)   Wt 243 lb (110.2 kg)   SpO2 99%   BMI 39.22 kg/m  General:   Well developed, obese appearing. NAD.  HEENT:  Normocephalic . Face symmetric, atraumatic Lungs:  CTA B Normal respiratory effort, no intercostal retractions, no accessory muscle use. Heart: RRR,  no murmur.  No pretibial edema bilaterally.  Good pedal pulses bilaterally MSK: Feet symmetric, + calluses noted, slightly TTP at the heel on the right but not on the left. Skin: Not pale. Not jaundice Neurologic:  alert & oriented X3.  Speech normal, gait appropriate for age and unassisted Psych--  Cognition and judgment appear intact.  Cooperative with normal attention span and concentration.  Behavior appropriate. No anxious or depressed appearing.      Assessment & Plan:  Assessment HTN Hyperlipidemia- Pravachol, Crestor: Myalgias. Intolerant to Zetia (joint aches), see OV  06/2016  Depression, Anxiety (on clonazepam), insomnia  (on trazodone): depression x many  years, remotely on zoloft and other meds per psych, I rx lexapro 2015, then switch to effexor ; Morbid obesity GI:  --GERD, Barrett's esophagus, EGD 05-2015 --h/o persistent C. difficile diarrhea 2014 B12 deficiency Osteopenia: Dexa 2015 showed  osteopenia, T score -1.9 (10/2016): Rx cs, vit D RLS - Requip, see Pulmonary Migraines, f/u [facility] -- off topamax as off 06-2016 MSK: --Back pain, chronic: pain meds rx by ortho, Dr Eddie Dibbles retired, rx per Workers comp MD --DJD --CTS B OAB, LUTS-- on myrbetriq Dr McDarmoth  H/o Cardiac catheterization 2011 and 2014 negative  PLAN SOB: On and off, stable, again rec  gradual exercise program as the most likely DX is deconditioning HTN: Well-controlled, last BMP satisfactory, continue with Lotensin. Anxiety depression insomnia: Effexor and clonazepam, controlled although continue to have a difficult relationship with her son.  She is counseled., UDS and contract today. B12 deficiency: Shot today GERD: Sxs gone, likes to get off omeprazole, recommend to wean off.  Call if symptoms resurface. RTC 4 months

## 2017-02-14 NOTE — Patient Instructions (Addendum)
  GO TO THE FRONT DESK Schedule your next appointment for a checkup in 4 months  Wean off omeprazole: Take 1 tablet 3 times a week for 2-3 weeks then stop  Please see your podiatrist  Start a gradual  exercise program

## 2017-02-14 NOTE — Progress Notes (Signed)
Pre visit review using our clinic review tool, if applicable. No additional management support is needed unless otherwise documented below in the visit note. 

## 2017-02-14 NOTE — Assessment & Plan Note (Signed)
-  Td  2012 ;   Pneumonia shot: 2012;  prevnar 2015;  shingles inmunization : 11-2013; had a flu shot, shingrix discussed  CCS: Colonoscopy 03-2006,  Colonoscopy 10-11 , essentially negative  Colonoscopy 11- 2016, next per GI Female care  PAPS per Dr Deatra Ina, last visit 10-2014  MMG 09-2016 neg

## 2017-02-15 NOTE — Assessment & Plan Note (Signed)
SOB: On and off, stable, again rec  gradual exercise program as the most likely DX is deconditioning HTN: Well-controlled, last BMP satisfactory, continue with Lotensin. Anxiety depression insomnia: Effexor and clonazepam, controlled although continue to have a difficult relationship with her son.  She is counseled., UDS and contract today. B12 deficiency: Shot today GERD: Sxs gone, likes to get off omeprazole, recommend to wean off.  Call if symptoms resurface. RTC 4 months

## 2017-02-21 LAB — PAIN MGMT, PROFILE 8 W/CONF, U
6 Acetylmorphine: NEGATIVE ng/mL (ref <10)
Alcohol Metabolites: NEGATIVE ng/mL (ref <500)
Amphetamines: NEGATIVE ng/mL (ref <500)
Benzodiazepines: NEGATIVE ng/mL (ref <100)
Buprenorphine, Urine: NEGATIVE ng/mL (ref <5)
Cocaine Metabolite: NEGATIVE ng/mL (ref <150)
Codeine: NEGATIVE ng/mL (ref <50)
Creatinine: 112 mg/dL
Hydrocodone: NEGATIVE ng/mL (ref <50)
Hydromorphone: NEGATIVE ng/mL (ref <50)
MDMA: NEGATIVE ng/mL (ref <500)
Marijuana Metabolite: NEGATIVE ng/mL (ref <20)
Morphine: NEGATIVE ng/mL (ref <50)
Norhydrocodone: 101 ng/mL — ABNORMAL HIGH (ref <50)
Opiates: POSITIVE ng/mL — AB (ref <100)
Oxidant: NEGATIVE ug/mL (ref <200)
Oxycodone: NEGATIVE ng/mL (ref <100)
pH: 6.42 (ref 4.5–9.0)

## 2017-02-25 ENCOUNTER — Ambulatory Visit (INDEPENDENT_AMBULATORY_CARE_PROVIDER_SITE_OTHER): Payer: Medicare Other | Admitting: Psychology

## 2017-02-25 ENCOUNTER — Ambulatory Visit (INDEPENDENT_AMBULATORY_CARE_PROVIDER_SITE_OTHER): Payer: Medicare Other | Admitting: Pulmonary Disease

## 2017-02-25 ENCOUNTER — Encounter: Payer: Self-pay | Admitting: Pulmonary Disease

## 2017-02-25 VITALS — BP 122/84 | HR 107 | Ht 66.0 in | Wt 242.6 lb

## 2017-02-25 DIAGNOSIS — G2581 Restless legs syndrome: Secondary | ICD-10-CM

## 2017-02-25 DIAGNOSIS — F332 Major depressive disorder, recurrent severe without psychotic features: Secondary | ICD-10-CM | POA: Diagnosis not present

## 2017-02-25 MED ORDER — ROPINIROLE HCL 1 MG PO TABS: ORAL_TABLET | ORAL | 11 refills | Status: DC

## 2017-02-25 NOTE — Patient Instructions (Signed)
Follow up with pulmonary/sleep medicine as needed

## 2017-02-25 NOTE — Progress Notes (Signed)
   Subjective:    Patient ID: [name], female    DOB: [birth year], 72 y.o.   MRN: [id number]  HPI    Review of Systems  Constitutional: Positive for appetite change and unexpected weight change. Negative for fever.  HENT: Positive for ear pain. Negative for congestion, dental problem, nosebleeds, postnasal drip, rhinorrhea, sinus pressure, sneezing, sore throat and trouble swallowing.   Eyes: Negative for redness and itching.  Respiratory: Positive for shortness of breath. Negative for cough, chest tightness and wheezing.   Cardiovascular: Negative for palpitations and leg swelling.  Gastrointestinal: Negative for nausea and vomiting.  Genitourinary: Negative for dysuria.  Musculoskeletal: Negative for joint swelling.  Skin: Negative for rash.  Allergic/Immunologic: Negative.  Negative for environmental allergies, food allergies and immunocompromised state.  Neurological: Positive for dizziness, light-headedness and headaches.  Hematological: Bruises/bleeds easily.  Psychiatric/Behavioral: Negative for dysphoric mood. The patient is nervous/anxious.        Objective:   Physical Exam        Assessment & Plan:

## 2017-02-25 NOTE — Progress Notes (Signed)
[facility]  Chief Complaint  Patient presents with  . sleep consult    Former Dr. Gwenette Greet MD patient; restless leg syndrome. Pt states it takes 1-2 hours to fall asleep, than when asleep cannot stay asleep.     Vital signs: BP 122/84 (BP Location: Left Arm, Cuff Size: Normal)   Pulse (!) 107   Ht 5\' 6"  (1.676 m)   Wt 242 lb 9.6 oz (110 kg)   SpO2 97%   BMI 39.16 kg/m   History of Present Illness: [name] is a 72 y.o. female with restless leg syndrome.  She was previously followed by Dr. Gwenette Greet.  She takes 1 mg requip at lunch time and then another 1 mg in the evening.  She doesn't have any leg symptoms as long as she doesn't miss a dose.   She goes to bed between 9 pm and 1 am.  She will sometimes fall asleep while watching TV.  She then has trouble falling back to sleep in bed.  She drinks coffee in the evening time.  She gets up between 7 and 10 am.  She takes trazodone as she is getting ready to go to sleep.  She is being weaned of norco.  She has been using CBD and this helps her pain and she gets better sleep.  She doesn't use klonopin that often.  Physical Exam:  General - pleasant Eyes - pupils reactive ENT - no sinus tenderness, no oral exudate, no LAN Cardiac - regular, no murmur Chest - no wheeze, rales Abd - soft, non tender Ext - no edema Skin - no rashes Neuro - normal strength Psych - normal mood  Assessment/Plan:  Restless leg syndrome. - continue requip 1 mg at lunch time and 1 mg in evening time - this has been stable - she can f/u with PCP for refills and return to sleep medicine if her symptoms progress  Insomnia with poor sleep hygiene. - discussed proper sleep hygiene - discussed importance of maintaining a regular sleep-wake schedule - advised her to try taking trazodone about 45 minutes to 1 hour before desired bedtime   Patient Instructions  Follow up with pulmonary/sleep medicine as  needed    Chesley Mires, MD [place] 02/25/2017, 3:19 PM Pager:  [phone]  Flow Sheet  Sleep tests: PSG 10/25/06 >> AHI 3  Review of Systems: Constitutional: Positive for appetite change and unexpected weight change. Negative for fever.  HENT: Positive for ear pain. Negative for congestion, dental problem, nosebleeds, postnasal drip, rhinorrhea, sinus pressure, sneezing, sore throat and trouble swallowing.   Eyes: Negative for redness and itching.  Respiratory: Positive for shortness of breath. Negative for cough, chest tightness and wheezing.   Cardiovascular: Negative for palpitations and leg swelling.  Gastrointestinal: Negative for nausea and vomiting.  Genitourinary: Negative for dysuria.  Musculoskeletal: Negative for joint swelling.  Skin: Negative for rash.  Allergic/Immunologic: Negative.  Negative for environmental allergies, food allergies and immunocompromised state.  Neurological: Positive for dizziness, light-headedness and headaches.  Hematological: Bruises/bleeds easily.  Psychiatric/Behavioral: Negative for dysphoric mood. The patient is nervous/anxious.    Past Medical History: She  has a past medical history of Anemia, Anxiety, Barrett's esophagus, Bilateral carpal tunnel syndrome, C. difficile diarrhea (08/01/2012), Chronic cystitis, Chronic lower back pain, Depression, DJD (degenerative joint disease), Family history of anesthesia complication, GERD (gastroesophageal reflux disease), H/O cardiac catheterization, HTN (hypertension), Hyperlipidemia, Insomnia, Migraine, Osteoporosis, RLS (restless legs syndrome), and Vitamin B 12 deficiency (  04/09/2013).  Past Surgical History: She  has a past surgical history that includes Vaginal hysterectomy; Oophorectomy; Bilateral knee arthroscopy (Bilateral); Foot surgery (Bilateral); Arm surgery (Left); Colonoscopy w/ polypectomy; Cardiac catheterization (01/22/10); Shoulder arthroscopy (Right); Lumbar disc  surgery; Cataract extraction w/ intraocular lens  implant, bilateral (Bilateral); Knot; Total knee arthroplasty (10/05/2011); and Cholecystectomy (N/A, 10/25/2016).  Family History: Her family history includes Breast cancer in her sister; CAD in her father; Colon cancer in her maternal grandfather and maternal uncle; Dementia in her father; Diabetes in her other; Esophageal cancer in her brother; Lung cancer in her mother; Stroke in her father.  Social History: She  reports that she quit smoking about 42 years ago. She has a 5.00 pack-year smoking history. she has never used smokeless tobacco. She reports that she drinks alcohol. She reports that she does not use drugs.  Medications: Allergies as of 02/25/2017      Reactions   Dextromethorphan-guaifenesin Nausea And Vomiting   Morphine Nausea And Vomiting   Penicillins Rash   "> 30 years ago; best I can remember it was just a light rash on my arm"      Medication List        Accurate as of 02/25/17  3:19 PM. Always use your most recent med list.          aspirin EC 81 MG tablet Take 81 mg by mouth daily.   benazepril 20 MG tablet Commonly known as:  LOTENSIN Take 1 tablet (20 mg total) by mouth daily.   clonazePAM 0.5 MG tablet Commonly known as:  KLONOPIN Take 1 tablet (0.5 mg total) by mouth 3 (three) times daily as needed for anxiety.   HYDROcodone-acetaminophen 10-325 MG tablet Commonly known as:  NORCO Take 1 tablet by mouth every 4 (four) hours as needed for moderate pain or severe pain. For back pain   rOPINIRole 1 MG tablet Commonly known as:  REQUIP TAKE 1 TABLET (1 MG TOTAL) BY MOUTH 2 (TWO) TIMES DAILY.   traZODone 50 MG tablet Commonly known as:  DESYREL Take 1-2 tablets (50-100 mg total) by mouth at bedtime.   venlafaxine XR 75 MG 24 hr capsule Commonly known as:  EFFEXOR-XR Take 3 capsules (225 mg total) by mouth daily with breakfast.

## 2017-03-04 ENCOUNTER — Ambulatory Visit: Payer: Medicare Other | Admitting: Psychology

## 2017-03-17 ENCOUNTER — Ambulatory Visit (INDEPENDENT_AMBULATORY_CARE_PROVIDER_SITE_OTHER): Payer: Medicare Other

## 2017-03-17 DIAGNOSIS — E538 Deficiency of other specified B group vitamins: Secondary | ICD-10-CM | POA: Diagnosis not present

## 2017-03-17 MED ORDER — CYANOCOBALAMIN 1000 MCG/ML IJ SOLN
1000.0000 ug | Freq: Once | INTRAMUSCULAR | Status: AC
Start: 2017-03-17 — End: 2017-03-17
  Administered 2017-03-17: 1000 ug via INTRAMUSCULAR

## 2017-03-17 NOTE — Progress Notes (Signed)
Pt came in today to receive B12 injection. Given in Left Deltoid at 6712 without complication. Pt scheduled follow up injection with front desk on her way out. Pt to follow up in 4 weeks.

## 2017-03-18 ENCOUNTER — Ambulatory Visit: Payer: Self-pay

## 2017-03-26 ENCOUNTER — Other Ambulatory Visit: Payer: Self-pay | Admitting: Internal Medicine

## 2017-04-01 ENCOUNTER — Ambulatory Visit (INDEPENDENT_AMBULATORY_CARE_PROVIDER_SITE_OTHER): Payer: Medicare Other | Admitting: Psychology

## 2017-04-01 DIAGNOSIS — F332 Major depressive disorder, recurrent severe without psychotic features: Secondary | ICD-10-CM | POA: Diagnosis not present

## 2017-04-15 ENCOUNTER — Ambulatory Visit (INDEPENDENT_AMBULATORY_CARE_PROVIDER_SITE_OTHER): Payer: Medicare Other | Admitting: Emergency Medicine

## 2017-04-15 ENCOUNTER — Ambulatory Visit (INDEPENDENT_AMBULATORY_CARE_PROVIDER_SITE_OTHER): Payer: Medicare Other | Admitting: Psychology

## 2017-04-15 DIAGNOSIS — E538 Deficiency of other specified B group vitamins: Secondary | ICD-10-CM

## 2017-04-15 DIAGNOSIS — F332 Major depressive disorder, recurrent severe without psychotic features: Secondary | ICD-10-CM | POA: Diagnosis not present

## 2017-04-15 MED ORDER — CYANOCOBALAMIN 1000 MCG/ML IJ SOLN
1000.0000 ug | Freq: Once | INTRAMUSCULAR | Status: AC
  Administered 2017-04-15: 1000 ug via INTRAMUSCULAR

## 2017-04-15 NOTE — Progress Notes (Signed)
Pre visit review using our clinic review tool, if applicable. No additional management support is needed unless otherwise documented below in the visit note.   Patient came in clinic for monthly B12 injection. IM injection given in Right deltoid. Patient tolerated it well.   Next appointment scheduled for April 5th at 2:45 pm.

## 2017-04-18 ENCOUNTER — Other Ambulatory Visit: Payer: Self-pay | Admitting: Internal Medicine

## 2017-04-29 ENCOUNTER — Ambulatory Visit (INDEPENDENT_AMBULATORY_CARE_PROVIDER_SITE_OTHER): Payer: Medicare Other | Admitting: Psychology

## 2017-04-29 DIAGNOSIS — F332 Major depressive disorder, recurrent severe without psychotic features: Secondary | ICD-10-CM | POA: Diagnosis not present

## 2017-05-02 ENCOUNTER — Emergency Department (HOSPITAL_COMMUNITY)
Admission: EM | Admit: 2017-05-02 | Discharge: 2017-05-02 | Disposition: A | Discharge status: Home / Self Care | Payer: Medicare Other | Source: Home / Self Care | Attending: Emergency Medicine | Admitting: Emergency Medicine

## 2017-05-02 ENCOUNTER — Encounter (HOSPITAL_COMMUNITY): Payer: Self-pay

## 2017-05-02 ENCOUNTER — Emergency Department (HOSPITAL_COMMUNITY): Payer: Medicare Other

## 2017-05-02 ENCOUNTER — Other Ambulatory Visit: Payer: Self-pay

## 2017-05-02 DIAGNOSIS — R0602 Shortness of breath: Secondary | ICD-10-CM | POA: Insufficient documentation

## 2017-05-02 DIAGNOSIS — I5043 Acute on chronic combined systolic (congestive) and diastolic (congestive) heart failure: Secondary | ICD-10-CM | POA: Diagnosis not present

## 2017-05-02 DIAGNOSIS — Z6841 Body Mass Index (BMI) 40.0 and over, adult: Secondary | ICD-10-CM | POA: Diagnosis not present

## 2017-05-02 DIAGNOSIS — I110 Hypertensive heart disease with heart failure: Secondary | ICD-10-CM | POA: Diagnosis not present

## 2017-05-02 DIAGNOSIS — R0789 Other chest pain: Secondary | ICD-10-CM | POA: Diagnosis not present

## 2017-05-02 DIAGNOSIS — R51 Headache: Secondary | ICD-10-CM | POA: Diagnosis not present

## 2017-05-02 DIAGNOSIS — R079 Chest pain, unspecified: Secondary | ICD-10-CM | POA: Diagnosis not present

## 2017-05-02 DIAGNOSIS — R5383 Other fatigue: Secondary | ICD-10-CM | POA: Diagnosis not present

## 2017-05-02 DIAGNOSIS — Z5321 Procedure and treatment not carried out due to patient leaving prior to being seen by health care provider: Secondary | ICD-10-CM | POA: Insufficient documentation

## 2017-05-02 DIAGNOSIS — I428 Other cardiomyopathies: Secondary | ICD-10-CM | POA: Diagnosis not present

## 2017-05-02 DIAGNOSIS — F419 Anxiety disorder, unspecified: Secondary | ICD-10-CM | POA: Diagnosis not present

## 2017-05-02 DIAGNOSIS — I200 Unstable angina: Secondary | ICD-10-CM | POA: Diagnosis not present

## 2017-05-02 LAB — BASIC METABOLIC PANEL
Anion gap: 11 (ref 5–15)
BUN: 14 mg/dL (ref 6–20)
CO2: 22 mmol/L (ref 22–32)
Calcium: 9.1 mg/dL (ref 8.9–10.3)
Chloride: 106 mmol/L (ref 101–111)
Creatinine, Ser: 0.87 mg/dL (ref 0.44–1.00)
GFR calc Af Amer: >60 mL/min (ref >60)
GFR calc non Af Amer: >60 mL/min (ref >60)
Glucose, Bld: 96 mg/dL (ref 65–99)
Potassium: 4.1 mmol/L (ref 3.5–5.1)
Sodium: 139 mmol/L (ref 135–145)

## 2017-05-02 LAB — CBC WITH DIFFERENTIAL/PLATELET
Basophils Absolute: 0 10*3/uL (ref 0.0–0.1)
Basophils Relative: 0 %
Eosinophils Absolute: 0.1 10*3/uL (ref 0.0–0.7)
Eosinophils Relative: 1 %
HCT: 39.5 % (ref 36.0–46.0)
Hemoglobin: 12.3 g/dL (ref 12.0–15.0)
Lymphocytes Relative: 36 %
Lymphs Abs: 3.2 10*3/uL (ref 0.7–4.0)
MCH: 29 pg (ref 26.0–34.0)
MCHC: 31.1 g/dL (ref 30.0–36.0)
MCV: 93.2 fL (ref 78.0–100.0)
Monocytes Absolute: 0.4 10*3/uL (ref 0.1–1.0)
Monocytes Relative: 5 %
Neutro Abs: 5.2 10*3/uL (ref 1.7–7.7)
Neutrophils Relative %: 58 %
Platelets: 273 10*3/uL (ref 150–400)
RBC: 4.24 MIL/uL (ref 3.87–5.11)
RDW: 14.5 % (ref 11.5–15.5)
WBC: 8.9 10*3/uL (ref 4.0–10.5)

## 2017-05-02 LAB — BRAIN NATRIURETIC PEPTIDE: B Natriuretic Peptide: 125.9 pg/mL — ABNORMAL HIGH (ref 0.0–100.0)

## 2017-05-02 LAB — I-STAT TROPONIN, ED: Troponin i, poc: 0.02 ng/mL (ref 0.00–0.08)

## 2017-05-02 NOTE — ED Triage Notes (Signed)
Pt presents today from urgent care with complaints of chest pain, shortness of breath and edema in her ankles over the last three weeks.

## 2017-05-02 NOTE — ED Provider Notes (Cosign Needed)
Patient placed in Quick Look pathway, seen and evaluated   Chief Complaint: shortness of breath  HPI:   [name] is a 72 y.o. female who presents to the ED from Urgent Care with shortness of breath and weight gain. Patient reports that over the past 2 1/2 weeks she has had about a 12 pound weight gain and has noted swelling in her feet. Today the shortness of breath got worse so she went to Urgent care and was told to come to the ED for evaluation.   ROS: Resp: shortness of breath  Physical Exam:   Gen: No distress  Neuro: Awake and Alert  Skin: Warm and dry slight swelling of the ankles  Resp: occasional rales      Focused Exam:    Initiation of care has begun. The patient has been counseled on the process, plan, and necessity for staying for the completion/evaluation, and the remainder of the medical screening examination    Ashley Murrain, NP 05/02/17 1726

## 2017-05-02 NOTE — ED Notes (Signed)
Pt states she is leaving and will see her PCP

## 2017-05-03 ENCOUNTER — Ambulatory Visit: Payer: Self-pay | Admitting: *Deleted

## 2017-05-03 NOTE — Telephone Encounter (Signed)
FYI

## 2017-05-03 NOTE — Telephone Encounter (Signed)
I saw the results, they are essentially normal.  If she still have symptoms needs to be seen. If symptoms severe needs to go to a urgent care.  If not severe schedule a visit w/ me for tomorrow.

## 2017-05-03 NOTE — Telephone Encounter (Signed)
Pt already scheduled for 05/04/2017 w/ PCP.

## 2017-05-03 NOTE — Telephone Encounter (Signed)
See triage note.   She went to the ED yesterday for chest pain and shortness of breath.   Blood work and chest x-ray done but she left after waiting 7 hours.   Did not see the doctor or know her results.  She was wanting to make sure Dr. Larose Kells can see her ED results at her visit on 05/04/17 at 1:00.   See below for more details.  I have routed a note to Dr. Ethel Rana nurse pool making him aware of the situation. Reason for Disposition . [1] Caller has NON-URGENT question (includes prescribed medication questions) AND [2] triager unable to answer  Answer Assessment - Initial Assessment Questions 1. MAIN CONCERN OR SYMPTOM:  "What is your main concern right now?" "What question do you have?" "What's the main symptom you're worried about?" (e.g., breathing difficulty, cough, fever. pain)     I went to the ED yesterday for chest pain and shortness of breath.  (I feel ok this morning).   After waiting 7 hours I left.   I was wondering if Dr. Larose Kells can see the results of my blood work and chest x-ray?   I let her know he can see the results.   I will send him a note letting him know of your ED visit so he can follow up on the results and make sure there is nothing critical you need to be aware of before your appt with Dr. Larose Kells tomorrow (05/04/17) at 1:00PM. 2. ONSET: "When did the  ________  start?"     *No Answer* 3. BETTER-SAME-WORSE: "Are you getting better, staying the same, or getting worse compared to how you felt at your last visit to the doctor (most recent medical visit)"?     "I just got up this morning and I'm feeling fine so far". 4. VISIT DATE: "When were you seen?" (Date)     Yesterday (05/02/17) 5. VISIT DOCTOR: "What is the name of the doctor taking care of you now?"     *No Answer* 6. VISIT DIAGNOSIS:  "What was the main symptom or problem that you were seen for?" "Were you given a diagnosis?"      Chest Pain and shortness of breath.   I left before seeing the doctor after being there 7 hours.    My back was hurting so bad.  (Not related to the ED visit reason). 7. VISIT MEDICATIONS: "Did the physician order any new medicines for you to use?" If yes: "Have you filled the prescription and started taking the medicine?"      No 8. NEXT APPOINTMENT: "Have you scheduled a follow-up appointment with your doctor?"     Yes  Dr. Larose Kells 05/04/17 at 1:00 9. PAIN: "Is there any pain?" If so, ask: "How bad is it?"  (Scale 1-10; or mild, moderate, severe)     None this morning. 10. FEVER: "Do you have a fever?" If so, ask: "What is it, how was it measured  and when did it start?"       Not asked 11. OTHER SYMPTOMS: "Do you have any other symptoms?"       Denies any chest pain or shortness of breath this morning.  Protocols used: RECENT MEDICAL VISIT FOR ILLNESS FOLLOW-UP CALL-A-AH

## 2017-05-04 ENCOUNTER — Emergency Department (HOSPITAL_BASED_OUTPATIENT_CLINIC_OR_DEPARTMENT_OTHER): Payer: Medicare Other

## 2017-05-04 ENCOUNTER — Ambulatory Visit (INDEPENDENT_AMBULATORY_CARE_PROVIDER_SITE_OTHER): Payer: Medicare Other | Admitting: Internal Medicine

## 2017-05-04 ENCOUNTER — Encounter (HOSPITAL_BASED_OUTPATIENT_CLINIC_OR_DEPARTMENT_OTHER): Payer: Self-pay

## 2017-05-04 ENCOUNTER — Encounter: Payer: Self-pay | Admitting: Internal Medicine

## 2017-05-04 ENCOUNTER — Inpatient Hospital Stay (HOSPITAL_BASED_OUTPATIENT_CLINIC_OR_DEPARTMENT_OTHER)
Admission: EM | Admit: 2017-05-04 | Discharge: 2017-05-08 | DRG: 287 | Disposition: A | Discharge status: Home / Self Care | Payer: Medicare Other | Attending: Internal Medicine | Admitting: Internal Medicine

## 2017-05-04 ENCOUNTER — Other Ambulatory Visit: Payer: Self-pay

## 2017-05-04 VITALS — BP 126/74 | HR 94 | Temp 98.2°F | Resp 14 | Ht 66.0 in | Wt 252.1 lb

## 2017-05-04 DIAGNOSIS — G5603 Carpal tunnel syndrome, bilateral upper limbs: Secondary | ICD-10-CM | POA: Diagnosis present

## 2017-05-04 DIAGNOSIS — Z8249 Family history of ischemic heart disease and other diseases of the circulatory system: Secondary | ICD-10-CM

## 2017-05-04 DIAGNOSIS — R000 Tachycardia, unspecified: Secondary | ICD-10-CM | POA: Diagnosis not present

## 2017-05-04 DIAGNOSIS — Z7982 Long term (current) use of aspirin: Secondary | ICD-10-CM

## 2017-05-04 DIAGNOSIS — Z96651 Presence of right artificial knee joint: Secondary | ICD-10-CM | POA: Diagnosis present

## 2017-05-04 DIAGNOSIS — Z87891 Personal history of nicotine dependence: Secondary | ICD-10-CM

## 2017-05-04 DIAGNOSIS — Z6841 Body Mass Index (BMI) 40.0 and over, adult: Secondary | ICD-10-CM | POA: Diagnosis not present

## 2017-05-04 DIAGNOSIS — E785 Hyperlipidemia, unspecified: Secondary | ICD-10-CM | POA: Diagnosis present

## 2017-05-04 DIAGNOSIS — Z961 Presence of intraocular lens: Secondary | ICD-10-CM | POA: Diagnosis present

## 2017-05-04 DIAGNOSIS — I110 Hypertensive heart disease with heart failure: Secondary | ICD-10-CM | POA: Diagnosis present

## 2017-05-04 DIAGNOSIS — F5105 Insomnia due to other mental disorder: Secondary | ICD-10-CM | POA: Diagnosis present

## 2017-05-04 DIAGNOSIS — F329 Major depressive disorder, single episode, unspecified: Secondary | ICD-10-CM | POA: Diagnosis present

## 2017-05-04 DIAGNOSIS — N3020 Other chronic cystitis without hematuria: Secondary | ICD-10-CM | POA: Diagnosis present

## 2017-05-04 DIAGNOSIS — I4519 Other right bundle-branch block: Secondary | ICD-10-CM | POA: Diagnosis present

## 2017-05-04 DIAGNOSIS — Z90722 Acquired absence of ovaries, bilateral: Secondary | ICD-10-CM | POA: Diagnosis not present

## 2017-05-04 DIAGNOSIS — Z833 Family history of diabetes mellitus: Secondary | ICD-10-CM

## 2017-05-04 DIAGNOSIS — I5043 Acute on chronic combined systolic (congestive) and diastolic (congestive) heart failure: Secondary | ICD-10-CM | POA: Diagnosis present

## 2017-05-04 DIAGNOSIS — Z9049 Acquired absence of other specified parts of digestive tract: Secondary | ICD-10-CM

## 2017-05-04 DIAGNOSIS — I428 Other cardiomyopathies: Secondary | ICD-10-CM | POA: Diagnosis present

## 2017-05-04 DIAGNOSIS — G2581 Restless legs syndrome: Secondary | ICD-10-CM | POA: Diagnosis present

## 2017-05-04 DIAGNOSIS — Z79899 Other long term (current) drug therapy: Secondary | ICD-10-CM

## 2017-05-04 DIAGNOSIS — F419 Anxiety disorder, unspecified: Secondary | ICD-10-CM | POA: Diagnosis present

## 2017-05-04 DIAGNOSIS — Z885 Allergy status to narcotic agent status: Secondary | ICD-10-CM

## 2017-05-04 DIAGNOSIS — K2270 Barrett's esophagus without dysplasia: Secondary | ICD-10-CM | POA: Diagnosis not present

## 2017-05-04 DIAGNOSIS — R0602 Shortness of breath: Secondary | ICD-10-CM | POA: Diagnosis not present

## 2017-05-04 DIAGNOSIS — R079 Chest pain, unspecified: Secondary | ICD-10-CM

## 2017-05-04 DIAGNOSIS — M810 Age-related osteoporosis without current pathological fracture: Secondary | ICD-10-CM | POA: Diagnosis present

## 2017-05-04 DIAGNOSIS — R51 Headache: Secondary | ICD-10-CM | POA: Diagnosis not present

## 2017-05-04 DIAGNOSIS — I5033 Acute on chronic diastolic (congestive) heart failure: Secondary | ICD-10-CM | POA: Diagnosis not present

## 2017-05-04 DIAGNOSIS — Z888 Allergy status to other drugs, medicaments and biological substances status: Secondary | ICD-10-CM

## 2017-05-04 DIAGNOSIS — Z801 Family history of malignant neoplasm of trachea, bronchus and lung: Secondary | ICD-10-CM

## 2017-05-04 DIAGNOSIS — I5030 Unspecified diastolic (congestive) heart failure: Secondary | ICD-10-CM | POA: Diagnosis present

## 2017-05-04 DIAGNOSIS — I10 Essential (primary) hypertension: Secondary | ICD-10-CM | POA: Diagnosis not present

## 2017-05-04 DIAGNOSIS — R0789 Other chest pain: Secondary | ICD-10-CM | POA: Diagnosis not present

## 2017-05-04 DIAGNOSIS — R519 Headache, unspecified: Secondary | ICD-10-CM | POA: Diagnosis present

## 2017-05-04 DIAGNOSIS — R5383 Other fatigue: Secondary | ICD-10-CM | POA: Diagnosis not present

## 2017-05-04 DIAGNOSIS — I200 Unstable angina: Secondary | ICD-10-CM | POA: Diagnosis present

## 2017-05-04 DIAGNOSIS — I5042 Chronic combined systolic (congestive) and diastolic (congestive) heart failure: Secondary | ICD-10-CM | POA: Diagnosis present

## 2017-05-04 DIAGNOSIS — Z981 Arthrodesis status: Secondary | ICD-10-CM

## 2017-05-04 DIAGNOSIS — I5021 Acute systolic (congestive) heart failure: Secondary | ICD-10-CM | POA: Diagnosis not present

## 2017-05-04 DIAGNOSIS — I509 Heart failure, unspecified: Secondary | ICD-10-CM | POA: Diagnosis not present

## 2017-05-04 DIAGNOSIS — K210 Gastro-esophageal reflux disease with esophagitis: Secondary | ICD-10-CM | POA: Diagnosis present

## 2017-05-04 DIAGNOSIS — Z880 Allergy status to penicillin: Secondary | ICD-10-CM

## 2017-05-04 LAB — CBC
HCT: 38.1 % (ref 36.0–46.0)
Hemoglobin: 12.2 g/dL (ref 12.0–15.0)
MCH: 29.5 pg (ref 26.0–34.0)
MCHC: 32 g/dL (ref 30.0–36.0)
MCV: 92.3 fL (ref 78.0–100.0)
Platelets: 250 10*3/uL (ref 150–400)
RBC: 4.13 MIL/uL (ref 3.87–5.11)
RDW: 14.1 % (ref 11.5–15.5)
WBC: 9 10*3/uL (ref 4.0–10.5)

## 2017-05-04 LAB — BASIC METABOLIC PANEL
Anion gap: 9 (ref 5–15)
BUN: 16 mg/dL (ref 6–20)
CO2: 23 mmol/L (ref 22–32)
Calcium: 8.9 mg/dL (ref 8.9–10.3)
Chloride: 105 mmol/L (ref 101–111)
Creatinine, Ser: 0.84 mg/dL (ref 0.44–1.00)
GFR calc Af Amer: >60 mL/min (ref >60)
GFR calc non Af Amer: >60 mL/min (ref >60)
Glucose, Bld: 116 mg/dL — ABNORMAL HIGH (ref 65–99)
Potassium: 4 mmol/L (ref 3.5–5.1)
Sodium: 137 mmol/L (ref 135–145)

## 2017-05-04 LAB — TROPONIN I: Troponin I: <0.03 ng/mL (ref <0.03)

## 2017-05-04 MED ORDER — ASPIRIN 81 MG PO CHEW
324.0000 mg | CHEWABLE_TABLET | Freq: Once | ORAL | Status: AC
  Administered 2017-05-04: 324 mg via ORAL
  Filled 2017-05-04: qty 4

## 2017-05-04 MED ORDER — NITROGLYCERIN 0.4 MG SL SUBL
0.4000 mg | SUBLINGUAL_TABLET | SUBLINGUAL | Status: DC | PRN
  Administered 2017-05-04: 0.4 mg via SUBLINGUAL
  Filled 2017-05-04: qty 1

## 2017-05-04 MED ORDER — ACETAMINOPHEN 325 MG PO TABS
650.0000 mg | ORAL_TABLET | Freq: Once | ORAL | Status: AC
  Administered 2017-05-04: 650 mg via ORAL
  Filled 2017-05-04: qty 2

## 2017-05-04 MED ORDER — ACETAMINOPHEN 500 MG PO TABS
1000.0000 mg | ORAL_TABLET | Freq: Once | ORAL | Status: AC
  Administered 2017-05-04: 1000 mg via ORAL
  Filled 2017-05-04: qty 2

## 2017-05-04 NOTE — Progress Notes (Signed)
72 yo female with hypertension, hyperlipidemia apparently c/o chest pain x 2 days ?, sent by pcp to ER for evaluation.  (trop negative) Needs cp rule out

## 2017-05-04 NOTE — Progress Notes (Signed)
Pre visit review using our clinic review tool, if applicable. No additional management support is needed unless otherwise documented below in the visit note. 

## 2017-05-04 NOTE — ED Triage Notes (Signed)
C/o CP x 2 weeks-was seen at [facility] ED 2 days ago-LWBS-was seen by PCP today and sent to ED-NAD-steady gait

## 2017-05-04 NOTE — Patient Instructions (Signed)
Please go to the ER, I already discussed your care with doctor up there.

## 2017-05-04 NOTE — ED Notes (Signed)
Pt resting in bed at thsi time. Family at bedside. No further needs at this time.

## 2017-05-04 NOTE — Progress Notes (Signed)
Subjective:    Patient ID: [name], female    DOB: 03-01-1945, 72 y.o.   MRN: [id number]  DOS:  05/04/2017 Type of visit - description : er f/u Interval history: Went to the ER 05/02/2017 with SOB and weight gain. Gained 12 pounds in approximately 2.5 weeks. She left without being seen. Labs reviewed: Troponin, BMP, CBC were wnl. BNP slt elevated at 125.9 Chest x-ray negative EKG: NSR, RBBB, not new.  Wt Readings from Last 3 Encounters:  05/04/17 252 lb 2 oz (114.4 kg)  05/02/17 249 lb (112.9 kg)  02/25/17 242 lb 9.6 oz (110 kg)     Review of Systems She is here for evaluation of chest pain, shortness of breath and swelling. She is started by saying that he had a viral syndrome about a month ago with runny nose, cough, some diarrhea.  All symptoms are gradually getting better but she has never go back to feel 100% well.  For the last 2-1/2 weeks, she has noted shortness of breath, DOE, chest pain and lower extremity edema. Breathing is getting slightly worse and from time to time has shortness of breath at rest. Her breathing is the best when she sits in a chair worse when she laid down in the recliner however at night she feels comfortable sleeping with only one pillow.  Chest pain is described as a "push", she points to the mid left anterior chest, lasts few minutes, then goes away without any intervention.  Has multiple episodes a day. She has not the chest pain triggered by activities of daily living at home. At the time I conducted the interview she said is feeling the "push"  Has GERD, few months ago she was asymptomatic and decrease omeprazole to 1 tablet a day only, she increase that back to 2 tablets a day because she having some epigastric abdominal pain also for 2 weeks.  Denies palpitations, cough. No recent airplane trip or prolonged car trip No nausea or vomiting.  No blood in the stools.  Past Medical History:  Diagnosis Date  . Anemia   . Anxiety   .  Barrett's esophagus   . Bilateral carpal tunnel syndrome    Dr. Eddie Dibbles, having injection therapy  . C. difficile diarrhea 08/01/2012   severe 2014  . Chronic cystitis    Dr. Matilde Sprang  . Chronic lower back pain   . Depression   . DJD (degenerative joint disease)    bilateral hands; knees  . Family history of anesthesia complication    Mother had severe N/V  . GERD (gastroesophageal reflux disease)   . H/O cardiac catheterization    (-) cath 12-2002  , cath again 2011 (-)  . HTN (hypertension)   . Hyperlipidemia   . Insomnia   . Migraine   . Osteoporosis    pt unsure of this  . RLS (restless legs syndrome)   . Vitamin B 12 deficiency 04/09/2013    Past Surgical History:  Procedure Laterality Date  . Arm surgery Left    "don't remember what they did; arm wasn't broken"  . BILATERAL KNEE ARTHROSCOPY Bilateral   . CARDIAC CATHETERIZATION  01/22/10   clean cath  . CATARACT EXTRACTION W/ INTRAOCULAR LENS  IMPLANT, BILATERAL Bilateral   . CHOLECYSTECTOMY N/A 10/25/2016   Procedure: LAPAROSCOPIC CHOLECYSTECTOMY;  Surgeon: Georganna Skeans, MD;  Location: [facility];  Service: General;  Laterality: N/A;  . COLONOSCOPY W/ POLYPECTOMY    . FOOT SURGERY Bilateral    toenails  removed; callus removed on right; hammertoes right"  . Knot     "removed from right neck; not a goiter"  . LUMBAR DISC SURGERY     L5 S1 anterior fusion  . OOPHORECTOMY    . SHOULDER ARTHROSCOPY Right   . TOTAL KNEE ARTHROPLASTY  10/05/2011   Procedure: TOTAL KNEE ARTHROPLASTY;  Surgeon: Hessie Dibble, MD;  Location: [facility];  Service: Orthopedics;  Laterality: Right;  [name] VAGINAL HYSTERECTOMY     for endometriosis    Social History   Socioeconomic History  . Marital status: Divorced    Spouse name: Not on file  . Number of children: 1  . Years of education: Not on file  . Highest education level: Not on file  Occupational History  . Occupation: Retired, post Ecologist: RETIRED  Social Needs  .  Financial resource strain: Not on file  . Food insecurity:    Worry: Not on file    Inability: Not on file  . Transportation needs:    Medical: Not on file    Non-medical: Not on file  Tobacco Use  . Smoking status: Former Smoker    Packs/day: 0.50    Years: 10.00    Pack years: 5.00    Last attempt to quit: 02/09/1975    Years since quitting: 42.2  . Smokeless tobacco: Never Used  . Tobacco comment: started at age 39.   quit in the 28s.  Substance and Sexual Activity  . Alcohol use: Yes    Alcohol/week: 0.0 oz    Comment: 10/05/2011 "socially;  wine or mixed drink at the most once/month"  . Drug use: No    Comment: CBD and hemp OIL   . Sexual activity: Never    Partners: Male  Lifestyle  . Physical activity:    Days per week: Not on file    Minutes per session: Not on file  . Stress: Not on file  Relationships  . Social connections:    Talks on phone: Not on file    Gets together: Not on file    Attends religious service: Not on file    Active member of club or organization: Not on file    Attends meetings of clubs or organizations: Not on file    Relationship status: Not on file  . Intimate partner violence:    Fear of current or ex partner: Not on file    Emotionally abused: Not on file    Physically abused: Not on file    Forced sexual activity: Not on file  Other Topics Concern  . Not on file  Social History Narrative   Son lives w/ her       Allergies as of 05/04/2017      Reactions   Dextromethorphan-guaifenesin Nausea And Vomiting   Morphine Nausea And Vomiting   Penicillins Rash   "> 30 years ago; best I can remember it was just a light rash on my arm"      Medication List        Accurate as of 05/04/17  1:14 PM. Always use your most recent med list.          aspirin EC 81 MG tablet Take 81 mg by mouth daily.   benazepril 20 MG tablet Commonly known as:  LOTENSIN Take 1 tablet (20 mg total) by mouth daily.   clonazePAM 0.5 MG  tablet Commonly known as:  KLONOPIN Take 1 tablet (0.5 mg total) by mouth  3 (three) times daily as needed for anxiety.   HYDROcodone-acetaminophen 10-325 MG tablet Commonly known as:  NORCO Take 1 tablet by mouth every 4 (four) hours as needed for moderate pain or severe pain. For back pain   rOPINIRole 1 MG tablet Commonly known as:  REQUIP TAKE 1 TABLET (1 MG TOTAL) BY MOUTH 2 (TWO) TIMES DAILY.   tiZANidine 4 MG tablet Commonly known as:  ZANAFLEX Take 4 mg by mouth 2 (two) times daily as needed for muscle spasms.   traZODone 50 MG tablet Commonly known as:  DESYREL Take 1-2 tablets (50-100 mg total) by mouth at bedtime.   venlafaxine XR 75 MG 24 hr capsule Commonly known as:  EFFEXOR-XR Take 3 capsules (225 mg total) by mouth daily with breakfast.          Objective:   Physical Exam BP 126/74 (BP Location: Left Arm, Patient Position: Sitting, Cuff Size: Normal)   Pulse 94   Temp 98.2 F (36.8 C) (Oral)   Resp 14   Ht 5\' 6"  (1.676 m)   Wt 252 lb 2 oz (114.4 kg)   SpO2 98%   BMI 40.69 kg/m  General:   Well developed, morbidly obese female in no distress HEENT:  Normocephalic . Face symmetric, atraumatic Neck: No JVD at 45 degrees Lungs:  CTA B Normal respiratory effort, no intercostal retractions, no accessory muscle use. Heart: RRR,  no murmur.  +/+++ pretibial edema bilaterally  Abdomen:  Not distended, soft, non-tender. No rebound or rigidity.   Skin: Not pale. Not jaundice Neurologic:  alert & oriented X3.  Speech normal, gait appropriate for age and unassisted Psych--  Cognition and judgment appear intact.  Cooperative with normal attention span and concentration.  Behavior appropriate. No anxious or depressed appearing.     Assessment & Plan:   Assessment HTN Hyperlipidemia- Pravachol, Crestor: Myalgias. Intolerant to Zetia (joint aches), see OV  06/2016  Depression, Anxiety (on clonazepam), insomnia (on trazodone): depression x many  years,  remotely on zoloft and other meds per psych, I rx lexapro 2015, then switch to effexor ; Morbid obesity GI:  --GERD, Barrett's esophagus, EGD 05-2015 --h/o persistent C. difficile diarrhea 2014 B12 deficiency Osteopenia: Dexa 2015 showed osteopenia, T score -1.9 (10/2016): Rx cs, vit D RLS - Requip, see Pulmonary Migraines, f/u [facility] -- off topamax as off 06-2016 MSK: --Back pain, chronic: pain meds rx by ortho, Dr Eddie Dibbles retired, rx per Workers comp MD --DJD --CTS B OAB, LUTS-- on myrbetriq Dr McDarmoth  H/o Cardiac catheterization 2011 and 2014 negative  PLAN Chest pain, shortness of breath, weight gain: Went to the ER a couple of days ago, did not get to see the MD however labs, chest x-ray and EKG were satisfactory except for slight increase BNP.  Today EKG today NSR, RBBB, unchanged Symptoms are concerning for CAD, CHF, and others. She does have some ill-defined upper abdominal discomfort, symptoms could be GI. Recommend ER, likely needs immediate workup and admission to rule out  And have further eval. Other issues: GERD: See last visit, we recommend to decrease omeprazole, a couple of days ago she went back to BID d/t  having some upper abdominal discomfort. Anxiety depression: Self DC Effexor, feeling slightly anxious.  Will discuss return to Effexor when she comes back

## 2017-05-04 NOTE — ED Provider Notes (Signed)
[facility] EMERGENCY DEPARTMENT Provider Note   CSN: [id number] Arrival date & time: 05/04/17  1350     History   Chief Complaint Chief Complaint  Patient presents with  . Chest Pain    HPI [name] is a 72 y.o. female.  HPI  Reports chest pain for 2 weeks. Feels like someone sitting on her chest.  Feels low pain sometimes, and sometimes is more severe. Is 6/10 now.  Center of chest without radiation but does note pain in the middle of her neck also for 2 weeks.  Better when sleeping on side.  Chest pain is worse with exertion and walking, and sometimes will feel it when at rest and when walking just to the kitchen or bathroom.  Saw PCP who sent her here for eval.  Shortness of breath.  No nausea or vomiting. No abdominal pain.  Reports flushed, reports night sweats for a few weeks.  No known hx of heart disease.  Cath 2011, not sure name of Cardiologist but was [facility].  Both ankles swelling.   No fevers, no cough now.   Short of breath with minimal exertion and with laying flat.   No smoking, dad had CHF  Past Medical History:  Diagnosis Date  . Anemia   . Anxiety   . Barrett's esophagus   . Bilateral carpal tunnel syndrome    Dr. Eddie Dibbles, having injection therapy  . C. difficile diarrhea 08/01/2012   severe 2014  . Chronic cystitis    Dr. Matilde Sprang  . Chronic lower back pain   . Depression   . DJD (degenerative joint disease)    bilateral hands; knees  . Family history of anesthesia complication    Mother had severe N/V  . GERD (gastroesophageal reflux disease)   . H/O cardiac catheterization    (-) cath 12-2002  , cath again 2011 (-)  . HTN (hypertension)   . Hyperlipidemia   . Insomnia   . Migraine   . Osteoporosis    pt unsure of this  . RLS (restless legs syndrome)   . Vitamin B 12 deficiency 04/09/2013    Patient Active Problem List   Diagnosis Date Noted  . Chest pain 05/04/2017  . Anxiety 10/23/2016  . Morbid obesity ([place]) 12/16/2015   . Myalgia 07/10/2015  . Hyperlipidemia 07/10/2015  . PCP NOTES >>>>>>>>>>>>>>>>> 10/31/2014  . Depression--insomnia 09/10/2013  . Vitamin B 12 deficiency 04/09/2013  . Chronic cystitis 05/29/2012  . Annual physical exam 06/14/2011  . CONSTIPATION 04/01/2009  . PARESTHESIA 04/01/2009  . DJD (degenerative joint disease) 10/21/2008  . BARRETTS ESOPHAGUS 07/19/2007  . PERSISTENT DISORDER INITIATING/MAINTAINING SLEEP 05/11/2007  . RESTLESS LEGS SYNDROME 05/11/2007  . Migraine-- on topamax, rx [facility] 07/27/2006  . HTN (hypertension) 07/27/2006  . Osteopenia 07/27/2006  . Fatigue 07/27/2006    Past Surgical History:  Procedure Laterality Date  . Arm surgery Left    "don't remember what they did; arm wasn't broken"  . BILATERAL KNEE ARTHROSCOPY Bilateral   . CARDIAC CATHETERIZATION  01/22/10   clean cath  . CATARACT EXTRACTION W/ INTRAOCULAR LENS  IMPLANT, BILATERAL Bilateral   . CHOLECYSTECTOMY N/A 10/25/2016   Procedure: LAPAROSCOPIC CHOLECYSTECTOMY;  Surgeon: Georganna Skeans, MD;  Location: [facility];  Service: General;  Laterality: N/A;  . COLONOSCOPY W/ POLYPECTOMY    . FOOT SURGERY Bilateral    toenails removed; callus removed on right; hammertoes right"  . Knot     "removed from right neck; not a  goiter"  . LUMBAR DISC SURGERY     L5 S1 anterior fusion  . OOPHORECTOMY    . SHOULDER ARTHROSCOPY Right   . TOTAL KNEE ARTHROPLASTY  10/05/2011   Procedure: TOTAL KNEE ARTHROPLASTY;  Surgeon: Hessie Dibble, MD;  Location: [facility];  Service: Orthopedics;  Laterality: Right;  [name] VAGINAL HYSTERECTOMY     for endometriosis     OB History   None      Home Medications    Prior to Admission medications   Medication Sig Start Date End Date Taking? Authorizing Provider  rOPINIRole (REQUIP) 1 MG tablet TAKE 1 TABLET (1 MG TOTAL) BY MOUTH 2 (TWO) TIMES DAILY. 02/25/17  Yes Chesley Mires, MD  aspirin EC 81 MG tablet Take 81 mg by mouth daily.    [provider]    benazepril (LOTENSIN) 20 MG tablet Take 1 tablet (20 mg total) by mouth daily. 03/28/17   Colon Branch, MD  clonazePAM (KLONOPIN) 0.5 MG tablet Take 1 tablet (0.5 mg total) by mouth 3 (three) times daily as needed for anxiety. 08/27/14   Colon Branch, MD  HYDROcodone-acetaminophen [facility]) 10-325 MG tablet Take 1 tablet by mouth every 4 (four) hours as needed for moderate pain or severe pain. For back pain 10/26/16   Rai, Ripudeep K, MD  tiZANidine (ZANAFLEX) 4 MG tablet Take 4 mg by mouth 2 (two) times daily as needed for muscle spasms.    [provider]  traZODone (DESYREL) 50 MG tablet Take 1-2 tablets (50-100 mg total) by mouth at bedtime. 04/18/17   Colon Branch, MD    Family History Family History  Problem Relation Age of Onset  . Lung cancer Mother   . Dementia Father   . CAD Father        dx in his 78s  . Stroke Father   . Colon cancer Maternal Grandfather   . Esophageal cancer Brother   . Breast cancer Sister   . Diabetes Other        Aunt  . Colon cancer Maternal Uncle        2  . Rectal cancer Neg Hx   . Stomach cancer Neg Hx     Social History Social History   Tobacco Use  . Smoking status: Former Smoker    Packs/day: 0.50    Years: 10.00    Pack years: 5.00    Last attempt to quit: 02/09/1975    Years since quitting: 42.2  . Smokeless tobacco: Never Used  . Tobacco comment: started at age 72.   quit in the 68s.  Substance Use Topics  . Alcohol use: Yes    Alcohol/week: 0.0 oz    Comment: occ  . Drug use: No    Comment: CBD and hemp OIL      Allergies   Dextromethorphan-guaifenesin; Morphine; and Penicillins   Review of Systems Review of Systems  Constitutional: Positive for fatigue. Negative for fever.  HENT: Negative for sore throat.   Eyes: Negative for visual disturbance.  Respiratory: Positive for shortness of breath. Negative for cough.   Cardiovascular: Positive for chest pain.  Gastrointestinal: Negative for abdominal pain, nausea and  vomiting.  Genitourinary: Negative for difficulty urinating.  Musculoskeletal: Negative for back pain and neck pain.  Skin: Negative for rash.  Neurological: Negative for syncope and headaches.     Physical Exam Updated Vital Signs BP 126/65 (BP Location: Left Arm)   Pulse 91   Temp 98.7 F (37.1 C) (  Oral)   Resp 16   Ht 5\' 6"  (1.676 m)   Wt 114 kg (251 lb 5.2 oz)   SpO2 96%   BMI 40.56 kg/m   Physical Exam  Constitutional: She is oriented to person, place, and time. She appears well-developed and well-nourished. No distress.  HENT:  Head: Normocephalic and atraumatic.  Eyes: Conjunctivae and EOM are normal.  Neck: Normal range of motion.  Cardiovascular: Normal rate, regular rhythm, normal heart sounds and intact distal pulses. Exam reveals no gallop and no friction rub.  No murmur heard. Pulmonary/Chest: Effort normal and breath sounds normal. No respiratory distress. She has no wheezes. She has no rales.  Abdominal: Soft. She exhibits no distension. There is no tenderness. There is no guarding.  Musculoskeletal: She exhibits no edema or tenderness.  Trace sock lines  Neurological: She is alert and oriented to person, place, and time.  Skin: Skin is warm and dry. No rash noted. She is not diaphoretic. No erythema.  Nursing note and vitals reviewed.    ED Treatments / Results  Labs (all labs ordered are listed, but only abnormal results are displayed) Labs Reviewed  BASIC METABOLIC PANEL - Abnormal; Notable for the following components:      Result Value   Glucose, Bld 116 (*)    All other components within normal limits  CBC  TROPONIN I  TROPONIN I  TROPONIN I  TROPONIN I  BRAIN NATRIURETIC PEPTIDE    EKG EKG Interpretation  Date/Time:  Wednesday May 04 2017 21:47:17 EDT Ventricular Rate:  97 PR Interval:  132 QRS Duration: 145 QT Interval:  420 QTC Calculation: 534 R Axis:   46 Text Interpretation:  Sinus rhythm Right bundle branch block No  significant change since last tracing Confirmed by Gareth Morgan [phone]) on 05/04/2017 11:58:24 PM   Radiology Dg Chest 2 View  Result Date: 05/04/2017 CLINICAL DATA:  Mid to left-sided chest pain with shortness of breath for 1 month. History of hypertension. EXAM: CHEST - 2 VIEW COMPARISON:  Radiographs 05/02/2017.  CT 11/05/2016. FINDINGS: The heart size and mediastinal contours are normal. The lungs are clear. There is no pleural effusion or pneumothorax. No acute osseous findings are identified. Postsurgical changes of the distal right clavicle. Mild thoracic spine degenerative changes. IMPRESSION: Stable chest.  No active cardiopulmonary process. Electronically Signed   By: Richardean Sale M.D.   On: 05/04/2017 14:25   Ct Head Wo Contrast  Result Date: 05/04/2017 CLINICAL DATA:  Headache EXAM: CT HEAD WITHOUT CONTRAST TECHNIQUE: Contiguous axial images were obtained from the base of the skull through the vertex without intravenous contrast. COMPARISON:  07/25/2006 FINDINGS: Brain: No evidence of acute infarction, hemorrhage, hydrocephalus, extra-axial collection or mass lesion/mass effect. Vascular: No hyperdense vessel or unexpected calcification. Skull: Normal. Negative for fracture or focal lesion. Sinuses/Orbits: The visualized paranasal sinuses are essentially clear. The mastoid air cells are unopacified. Other: None. IMPRESSION: Normal head CT. Electronically Signed   By: Julian Hy M.D.   On: 05/04/2017 18:09    Procedures Procedures (including critical care time)  Medications Ordered in ED Medications  nitroGLYCERIN (NITROSTAT) SL tablet 0.4 mg (0.4 mg Sublingual Given 05/04/17 1740)  nitroGLYCERIN (NITROSTAT) SL tablet 0.4 mg (0.4 mg Sublingual Given 05/04/17 2143)  enoxaparin (LOVENOX) injection 30 mg (30 mg Subcutaneous Given 05/05/17 0026)  aspirin EC tablet 81 mg (has no administration in time range)  clonazePAM (KLONOPIN) tablet 0.5 mg (has no administration in time  range)  traZODone (DESYREL) tablet 50-100 mg (  0 mg Oral Hold 05/05/17 0012)  aspirin chewable tablet 324 mg (324 mg Oral Given 05/04/17 1737)  acetaminophen (TYLENOL) tablet 1,000 mg (1,000 mg Oral Given 05/04/17 1738)  acetaminophen (TYLENOL) tablet 650 mg (650 mg Oral Given 05/04/17 2139)     Initial Impression / Assessment and Plan / ED Course  I have reviewed the triage vital signs and the nursing notes.  Pertinent labs & imaging results that were available during my care of the patient were reviewed by me and considered in my medical decision making (see chart for details).     72yo female with history of htn, hlpd, presents with concern for chest pain.  EKG was done and evaluate by me and showed no acute ST changes and no signs of pericarditis. Chest x-ray was done and evaluated by me and radiology and showed no sign of pneumonia or pneumothorax. Patient is low risk [place], with symptoms more concerning for cardiac pain, and have low suspicion for PE. Normal bilateral pulses, history and exam not consistent with dissection.  Initial troponin negative.  Pt also reports headaches for weeks, head CT WNL.    Given aspirin, nitroglycerin with improvement in CP however continuing symptoms.  She is high risk HEAR score of 6 with concerning history for anginal symptoms.    CP worsened and she was again given nitro, and with improvement of chest pain down to 0. Ordered medications, VTE ppx, awaiting bed at [facility].  Initially had stepdown bed for continued CP but given pain resolved she is appropirate for telemetry bed.     Final Clinical Impressions(s) / ED Diagnoses   Final diagnoses:  Chest pain with high risk for cardiac etiology    ED Discharge Orders    None       Gareth Morgan, MD 05/05/17 0045

## 2017-05-05 ENCOUNTER — Other Ambulatory Visit (HOSPITAL_COMMUNITY): Payer: Self-pay

## 2017-05-05 ENCOUNTER — Encounter (HOSPITAL_COMMUNITY): Payer: Self-pay | Admitting: Family Medicine

## 2017-05-05 DIAGNOSIS — I5042 Chronic combined systolic (congestive) and diastolic (congestive) heart failure: Secondary | ICD-10-CM | POA: Diagnosis present

## 2017-05-05 DIAGNOSIS — I5030 Unspecified diastolic (congestive) heart failure: Secondary | ICD-10-CM | POA: Diagnosis present

## 2017-05-05 DIAGNOSIS — R519 Headache, unspecified: Secondary | ICD-10-CM | POA: Diagnosis present

## 2017-05-05 DIAGNOSIS — I10 Essential (primary) hypertension: Secondary | ICD-10-CM

## 2017-05-05 DIAGNOSIS — R51 Headache: Secondary | ICD-10-CM

## 2017-05-05 DIAGNOSIS — R0789 Other chest pain: Secondary | ICD-10-CM

## 2017-05-05 DIAGNOSIS — I5033 Acute on chronic diastolic (congestive) heart failure: Secondary | ICD-10-CM

## 2017-05-05 DIAGNOSIS — K2270 Barrett's esophagus without dysplasia: Secondary | ICD-10-CM

## 2017-05-05 DIAGNOSIS — R079 Chest pain, unspecified: Secondary | ICD-10-CM

## 2017-05-05 LAB — COMPREHENSIVE METABOLIC PANEL
ALT: 15 U/L (ref 14–54)
AST: 19 U/L (ref 15–41)
Albumin: 3.3 g/dL — ABNORMAL LOW (ref 3.5–5.0)
Alkaline Phosphatase: 74 U/L (ref 38–126)
Anion gap: 11 (ref 5–15)
BUN: 13 mg/dL (ref 6–20)
CO2: 22 mmol/L (ref 22–32)
Calcium: 8.6 mg/dL — ABNORMAL LOW (ref 8.9–10.3)
Chloride: 107 mmol/L (ref 101–111)
Creatinine, Ser: 0.88 mg/dL (ref 0.44–1.00)
GFR calc Af Amer: >60 mL/min (ref >60)
GFR calc non Af Amer: >60 mL/min (ref >60)
Glucose, Bld: 106 mg/dL — ABNORMAL HIGH (ref 65–99)
Potassium: 4 mmol/L (ref 3.5–5.1)
Sodium: 140 mmol/L (ref 135–145)
Total Bilirubin: 0.9 mg/dL (ref 0.3–1.2)
Total Protein: 6.3 g/dL — ABNORMAL LOW (ref 6.5–8.1)

## 2017-05-05 LAB — TROPONIN I
Troponin I: <0.03 ng/mL (ref <0.03)
Troponin I: <0.03 ng/mL (ref <0.03)

## 2017-05-05 LAB — BRAIN NATRIURETIC PEPTIDE: B Natriuretic Peptide: 112.5 pg/mL — ABNORMAL HIGH (ref 0.0–100.0)

## 2017-05-05 LAB — LIPASE, BLOOD: Lipase: 20 U/L (ref 11–51)

## 2017-05-05 MED ORDER — ENOXAPARIN SODIUM 40 MG/0.4ML ~~LOC~~ SOLN
40.0000 mg | SUBCUTANEOUS | Status: DC
  Administered 2017-05-05 – 2017-05-07 (×3): 40 mg via SUBCUTANEOUS
  Filled 2017-05-05 (×3): qty 0.4

## 2017-05-05 MED ORDER — ASPIRIN EC 81 MG PO TBEC
81.0000 mg | DELAYED_RELEASE_TABLET | Freq: Every day | ORAL | Status: DC
  Administered 2017-05-05 – 2017-05-08 (×4): 81 mg via ORAL
  Filled 2017-05-05 (×4): qty 1

## 2017-05-05 MED ORDER — ACETAMINOPHEN 325 MG PO TABS
650.0000 mg | ORAL_TABLET | ORAL | Status: DC | PRN
  Administered 2017-05-07: 650 mg via ORAL
  Filled 2017-05-05: qty 2

## 2017-05-05 MED ORDER — SODIUM CHLORIDE 0.9% FLUSH: 3.0000 mL | INTRAVENOUS | Status: DC | PRN

## 2017-05-05 MED ORDER — ENOXAPARIN SODIUM 40 MG/0.4ML ~~LOC~~ SOLN
30.0000 mg | SUBCUTANEOUS | Status: DC
  Administered 2017-05-05: 30 mg via SUBCUTANEOUS
  Filled 2017-05-05: qty 0.4

## 2017-05-05 MED ORDER — BENAZEPRIL HCL 10 MG PO TABS
20.0000 mg | ORAL_TABLET | Freq: Every day | ORAL | Status: DC
  Administered 2017-05-05 – 2017-05-06 (×2): 20 mg via ORAL
  Filled 2017-05-05 (×2): qty 2

## 2017-05-05 MED ORDER — NITROGLYCERIN 0.4 MG SL SUBL: 0.4000 mg | SUBLINGUAL_TABLET | SUBLINGUAL | Status: DC | PRN

## 2017-05-05 MED ORDER — GI COCKTAIL ~~LOC~~: 30.0000 mL | Freq: Three times a day (TID) | ORAL | Status: DC | PRN

## 2017-05-05 MED ORDER — ONDANSETRON HCL 4 MG/2ML IJ SOLN: 4.0000 mg | Freq: Four times a day (QID) | INTRAMUSCULAR | Status: DC | PRN

## 2017-05-05 MED ORDER — FAMOTIDINE IN NACL 20-0.9 MG/50ML-% IV SOLN: 20.0000 mg | Freq: Two times a day (BID) | INTRAVENOUS | Status: DC

## 2017-05-05 MED ORDER — SODIUM CHLORIDE 0.9% FLUSH
3.0000 mL | Freq: Two times a day (BID) | INTRAVENOUS | Status: DC
  Administered 2017-05-05 – 2017-05-08 (×7): 3 mL via INTRAVENOUS

## 2017-05-05 MED ORDER — SODIUM CHLORIDE 0.9 % IV SOLN: 250.0000 mL | INTRAVENOUS | Status: DC | PRN

## 2017-05-05 MED ORDER — CLONAZEPAM 0.5 MG PO TABS
0.5000 mg | ORAL_TABLET | Freq: Three times a day (TID) | ORAL | Status: DC | PRN
  Filled 2017-05-05: qty 1

## 2017-05-05 MED ORDER — TRAZODONE HCL 50 MG PO TABS
50.0000 mg | ORAL_TABLET | Freq: Every day | ORAL | Status: DC
  Administered 2017-05-05 – 2017-05-07 (×3): 100 mg via ORAL
  Filled 2017-05-05 (×4): qty 2

## 2017-05-05 MED ORDER — ROPINIROLE HCL 1 MG PO TABS
1.0000 mg | ORAL_TABLET | Freq: Two times a day (BID) | ORAL | Status: DC
  Administered 2017-05-05 – 2017-05-08 (×7): 1 mg via ORAL
  Filled 2017-05-05 (×7): qty 1

## 2017-05-05 MED ORDER — TIZANIDINE HCL 4 MG PO TABS
4.0000 mg | ORAL_TABLET | Freq: Two times a day (BID) | ORAL | Status: DC | PRN
  Filled 2017-05-05: qty 1

## 2017-05-05 MED ORDER — LOPERAMIDE HCL 2 MG PO CAPS
2.0000 mg | ORAL_CAPSULE | Freq: Four times a day (QID) | ORAL | Status: DC | PRN
  Administered 2017-05-05 (×2): 2 mg via ORAL
  Filled 2017-05-05 (×2): qty 1

## 2017-05-05 MED ORDER — FAMOTIDINE 20 MG PO TABS
20.0000 mg | ORAL_TABLET | Freq: Two times a day (BID) | ORAL | Status: DC
  Administered 2017-05-05 – 2017-05-08 (×7): 20 mg via ORAL
  Filled 2017-05-05 (×7): qty 1

## 2017-05-05 MED ORDER — FUROSEMIDE 10 MG/ML IJ SOLN
20.0000 mg | Freq: Two times a day (BID) | INTRAMUSCULAR | Status: DC
  Administered 2017-05-05 – 2017-05-06 (×3): 20 mg via INTRAVENOUS
  Filled 2017-05-05 (×3): qty 2

## 2017-05-05 MED ORDER — HYDROCODONE-ACETAMINOPHEN 10-325 MG PO TABS: 1.0000 | ORAL_TABLET | ORAL | Status: DC | PRN

## 2017-05-05 NOTE — Assessment & Plan Note (Signed)
PLAN Chest pain, shortness of breath, weight gain: Went to the ER a couple of days ago, did not get to see the MD however labs, chest x-ray and EKG were satisfactory except for slight increase BNP.  Today EKG today NSR, RBBB, unchanged Symptoms are concerning for CAD, CHF, and others. She does have some ill-defined upper abdominal discomfort, symptoms could be GI. Recommend ER, likely needs immediate workup and admission to rule out  And have further eval. Other issues: GERD: See last visit, we recommend to decrease omeprazole, a couple of days ago she went back to BID d/t  having some upper abdominal discomfort. Anxiety depression: Self DC Effexor, feeling slightly anxious.  Will discuss return to Effexor when she comes back

## 2017-05-05 NOTE — Progress Notes (Signed)
Patient ID: [name], female   DOB: 1945/04/06, 72 y.o.   MRN: [id number] Patient was admitted early this morning for shortness of breath, chest discomfort and weight gain.  She was started on intravenous Lasix.  Cardiology was consulted.  Troponins have been negative so far.  Currently chest pain-free.  Patient seen and examined at bedside and plan of care discussed with her.  She is having diarrhea.  We will give her Imodium.  Repeat a.m. labs.  Follow cardiology recommendations.  Follow 2D echo.

## 2017-05-05 NOTE — H&P (Signed)
History and Physical    [name] WER:[id number] DOB: May 05, 1945 DOA: 05/04/2017  PCP: Colon Branch, MD   Patient coming from: Home, by way of [facility]   Chief Complaint: SOB, orthopnea, fatigue, wt gain, b/l ankle swelling, chest discomfort  HPI: [name] is a 72 y.o. female with medical history significant for hypertension, anxiety, chronic pain, Barrett esophagus, and chronic diastolic CHF, now presenting to the emergency department for evaluation of chest discomfort, shortness of breath, orthopnea, fatigue, bilateral ankle swelling, and weight gain over the past 3 weeks.  Patient was evaluated for these complaints in urgent care 3 days ago and directed to the ED where she  left prior to evaluation.  She describes the insidious development of bilateral lower extremity edema, chest discomfort, dyspnea on exertion, orthopnea, and a 14-pound weight gain.  Chest discomfort is described as a constant, moderate, "heavy" sensation with no alleviating or exacerbating factors identified.  She denies fevers or significant cough, notes her dyspnea is worse with lying flat or with activity.  She has never experienced these symptoms previously.  [facility] ED Course: Upon arrival to the ED, patient is found to be afebrile, saturating well on room air, and with vitals otherwise normal.  EKG features a sinus rhythm with chronic RBBB.  Chemistry panel and CBC are unremarkable, troponin is undetectable x2, and BNP is elevated to 113.  Chest x-ray is negative for acute cardiopulmonary disease and noncontrast head CT is negative for acute intracranial abnormality.  Patient was treated with 324 mg of aspirin, acetaminophen, and nitroglycerin.  She remains hemodynamically stable, in no apparent respiratory distress, and will be admitted to the telemetry unit for ongoing evaluation and management of the aforementioned symptoms, suspected secondary to acute on chronic diastolic CHF.  Review of Systems:    All other systems reviewed and apart from HPI, are negative.  Past Medical History:  Diagnosis Date  . Anemia   . Anxiety   . Barrett's esophagus   . Bilateral carpal tunnel syndrome    Dr. Eddie Dibbles, having injection therapy  . C. difficile diarrhea 08/01/2012   severe 2014  . Chronic cystitis    Dr. Matilde Sprang  . Chronic lower back pain   . Depression   . DJD (degenerative joint disease)    bilateral hands; knees  . Family history of anesthesia complication    Mother had severe N/V  . GERD (gastroesophageal reflux disease)   . H/O cardiac catheterization    (-) cath 12-2002  , cath again 2011 (-)  . HTN (hypertension)   . Hyperlipidemia   . Insomnia   . Migraine   . Osteoporosis    pt unsure of this  . RLS (restless legs syndrome)   . Vitamin B 12 deficiency 04/09/2013    Past Surgical History:  Procedure Laterality Date  . Arm surgery Left    "don't remember what they did; arm wasn't broken"  . BILATERAL KNEE ARTHROSCOPY Bilateral   . CARDIAC CATHETERIZATION  01/22/10   clean cath  . CATARACT EXTRACTION W/ INTRAOCULAR LENS  IMPLANT, BILATERAL Bilateral   . CHOLECYSTECTOMY N/A 10/25/2016   Procedure: LAPAROSCOPIC CHOLECYSTECTOMY;  Surgeon: Georganna Skeans, MD;  Location: [place];  Service: General;  Laterality: N/A;  . COLONOSCOPY W/ POLYPECTOMY    . FOOT SURGERY Bilateral    toenails removed; callus removed on right; hammertoes right"  . Knot     "removed from right neck; not a goiter"  . LUMBAR DISC  SURGERY     L5 S1 anterior fusion  . OOPHORECTOMY    . SHOULDER ARTHROSCOPY Right   . TOTAL KNEE ARTHROPLASTY  10/05/2011   Procedure: TOTAL KNEE ARTHROPLASTY;  Surgeon: Hessie Dibble, MD;  Location: [place];  Service: Orthopedics;  Laterality: Right;  [name] VAGINAL HYSTERECTOMY     for endometriosis     reports that she quit smoking about 42 years ago. She has a 5.00 pack-year smoking history. She has never used smokeless tobacco. She reports that she drinks alcohol. She  reports that she does not use drugs.  Allergies  Allergen Reactions  . Dextromethorphan-Guaifenesin Nausea And Vomiting  . Morphine Nausea And Vomiting  . Penicillins Rash    "> 30 years ago; best I can remember it was just a light rash on my arm"    Family History  Problem Relation Age of Onset  . Lung cancer Mother   . Dementia Father   . CAD Father        dx in his 36s  . Stroke Father   . Colon cancer Maternal Grandfather   . Esophageal cancer Brother   . Breast cancer Sister   . Diabetes Other        Aunt  . Colon cancer Maternal Uncle        2  . Rectal cancer Neg Hx   . Stomach cancer Neg Hx      Prior to Admission medications   Medication Sig Start Date End Date Taking? Authorizing Provider  rOPINIRole (REQUIP) 1 MG tablet TAKE 1 TABLET (1 MG TOTAL) BY MOUTH 2 (TWO) TIMES DAILY. 02/25/17  Yes Chesley Mires, MD  aspirin EC 81 MG tablet Take 81 mg by mouth daily.    [provider]  benazepril (LOTENSIN) 20 MG tablet Take 1 tablet (20 mg total) by mouth daily. 03/28/17   Colon Branch, MD  clonazePAM (KLONOPIN) 0.5 MG tablet Take 1 tablet (0.5 mg total) by mouth 3 (three) times daily as needed for anxiety. 08/27/14   Colon Branch, MD  HYDROcodone-acetaminophen [facility]) 10-325 MG tablet Take 1 tablet by mouth every 4 (four) hours as needed for moderate pain or severe pain. For back pain 10/26/16   Rai, Ripudeep K, MD  tiZANidine (ZANAFLEX) 4 MG tablet Take 4 mg by mouth 2 (two) times daily as needed for muscle spasms.    [provider]  traZODone (DESYREL) 50 MG tablet Take 1-2 tablets (50-100 mg total) by mouth at bedtime. 04/18/17   Colon Branch, MD    Physical Exam: Vitals:   05/04/17 2230 05/05/17 0032 05/05/17 0230 05/05/17 0415  BP: 123/69 126/65 (!) 129/52 (!) 155/79  Pulse: 79 91 72 63  Resp: 20 16 17    Temp:    (!) 97.5 F (36.4 C)  TempSrc:    Axillary  SpO2: 97% 96% 99% 96%  Weight:    113.1 kg (249 lb 4.8 oz)  Height:    5\' 6"  (1.676 m)       Constitutional: NAD, calm, obese  Eyes: PERTLA, lids and conjunctivae normal ENMT: Mucous membranes are moist. Posterior pharynx clear of any exudate or lesions.   Neck: normal, supple, no masses, no thyromegaly Respiratory: Mild dyspnea with speech, no wheezing, no crackles. Normal respiratory effort.    Cardiovascular: S1 & S2 heard, regular rate and rhythm. 1+ pedal edema bilaterally.  Abdomen: No distension, no tenderness, soft. Bowel sounds normal.  Musculoskeletal: no clubbing / cyanosis. No joint deformity  upper and lower extremities.   Skin: no significant rashes, lesions, ulcers. Warm, dry, well-perfused. Neurologic: CN 2-12 grossly intact. Sensation intact. Strength 5/5 in all 4 limbs.  Psychiatric:  Alert and oriented x 3. Pleasant and cooperative.     Labs on Admission: I have personally reviewed following labs and imaging studies  CBC: Recent Labs  Lab 05/02/17 1719 05/04/17 1408  WBC 8.9 9.0  NEUTROABS 5.2  --   HGB 12.3 12.2  HCT 39.5 38.1  MCV 93.2 92.3  PLT 273 350   Basic Metabolic Panel: Recent Labs  Lab 05/02/17 1719 05/04/17 1408  NA 139 137  K 4.1 4.0  CL 106 105  CO2 22 23  GLUCOSE 96 116*  BUN 14 16  CREATININE 0.87 0.84  CALCIUM 9.1 8.9   GFR: Estimated Creatinine Clearance: 77.2 mL/min (by C-G formula based on SCr of 0.84 mg/dL). Liver Function Tests: No results for input(s): AST, ALT, ALKPHOS, BILITOT, PROT, ALBUMIN in the last 168 hours. No results for input(s): LIPASE, AMYLASE in the last 168 hours. No results for input(s): AMMONIA in the last 168 hours. Coagulation Profile: No results for input(s): INR, PROTIME in the last 168 hours. Cardiac Enzymes: Recent Labs  Lab 05/04/17 1408 05/05/17 0031  TROPONINI <0.03 <0.03   BNP (last 3 results) No results for input(s): PROBNP in the last 8760 hours. HbA1C: No results for input(s): HGBA1C in the last 72 hours. CBG: No results for input(s): GLUCAP in the last 168  hours. Lipid Profile: No results for input(s): CHOL, HDL, LDLCALC, TRIG, CHOLHDL, LDLDIRECT in the last 72 hours. Thyroid Function Tests: No results for input(s): TSH, T4TOTAL, FREET4, T3FREE, THYROIDAB in the last 72 hours. Anemia Panel: No results for input(s): VITAMINB12, FOLATE, FERRITIN, TIBC, IRON, RETICCTPCT in the last 72 hours. Urine analysis:    Component Value Date/Time   COLORURINE YELLOW 10/23/2016 0626   APPEARANCEUR CLEAR 10/23/2016 0626   LABSPEC 1.040 (H) 10/23/2016 0626   PHURINE 5.0 10/23/2016 0626   GLUCOSEU NEGATIVE 10/23/2016 0626   GLUCOSEU NEGATIVE 07/12/2014 1310   HGBUR NEGATIVE 10/23/2016 0626   HGBUR negative 12/11/2007 1029   BILIRUBINUR NEGATIVE 10/23/2016 0626   BILIRUBINUR Negative 07/05/2014 1542   KETONESUR NEGATIVE 10/23/2016 0626   PROTEINUR NEGATIVE 10/23/2016 0626   UROBILINOGEN >=8.0 (A) 07/12/2014 1310   NITRITE NEGATIVE 10/23/2016 0626   LEUKOCYTESUR NEGATIVE 10/23/2016 0626   Sepsis Labs: @LABRCNTIP (procalcitonin:4,lacticidven:4) )No results found for this or any previous visit (from the past 240 hour(s)).   Radiological Exams on Admission: Dg Chest 2 View  Result Date: 05/04/2017 CLINICAL DATA:  Mid to left-sided chest pain with shortness of breath for 1 month. History of hypertension. EXAM: CHEST - 2 VIEW COMPARISON:  Radiographs 05/02/2017.  CT 11/05/2016. FINDINGS: The heart size and mediastinal contours are normal. The lungs are clear. There is no pleural effusion or pneumothorax. No acute osseous findings are identified. Postsurgical changes of the distal right clavicle. Mild thoracic spine degenerative changes. IMPRESSION: Stable chest.  No active cardiopulmonary process. Electronically Signed   By: Richardean Sale M.D.   On: 05/04/2017 14:25   Ct Head Wo Contrast  Result Date: 05/04/2017 CLINICAL DATA:  Headache EXAM: CT HEAD WITHOUT CONTRAST TECHNIQUE: Contiguous axial images were obtained from the base of the skull through the  vertex without intravenous contrast. COMPARISON:  07/25/2006 FINDINGS: Brain: No evidence of acute infarction, hemorrhage, hydrocephalus, extra-axial collection or mass lesion/mass effect. Vascular: No hyperdense vessel or unexpected calcification. Skull: Normal. Negative for fracture  or focal lesion. Sinuses/Orbits: The visualized paranasal sinuses are essentially clear. The mastoid air cells are unopacified. Other: None. IMPRESSION: Normal head CT. Electronically Signed   By: Julian Hy M.D.   On: 05/04/2017 18:09    EKG: Independently reviewed. Sinus rhythm, RBBB, similar to priors.   Assessment/Plan  1. Acute on chronic diastolic CHF  - Presents with 3 wks of SOB, orthopnea, wt gain, chest discomfort, fatigue, and bilateral ankle swelling  - Echo from 2011 with EF 50-55%, no WMA's, grade 1 diastolic dysfunction, mild LAE, and mild-mod TR  - Continue cardiac monitoring, diurese with Lasix 20 mg IV q12h and increase as needed, SLIV, follow daily and I/O's, continue ACE, update echo    2. Chest discomfort  - Presents with 3 wks of chest discomfort, SOB, orthopnea, wt gain, and bilateral ankle swelling  - EKG with RBBB and no significant change from prior; CXR unremarkable; troponin undetectable x2  - Seems atypical for ACS given 3 wks of discomfort with negative enzymes and unchanged EKG  - Check one more troponin now  - Possibly secondary to #1; GI etiology also considered given hx of esophagitis no longer on PPI or H2-blocker    - Continue cardiac monitoring, check a third troponin, echo ordered as above, continue ASA and ACE, trial Pepcid and GI cocktail    3. Hypertension  - BP at goal  - Continue benazepril    4. Barrett esophagus  - Pathology from 2017 consistent with esophagitis, negative for malignancy  - Not taking PPI or H2-blocker - This could be source of her chest discomfort and will trial Pepcid and GI cocktail   5. Headache  - Pt has hx of migraine, had HA in ED,  head CT normal, and HA has since resolved    6. Anxiety; insomnia  - Continue Klonopin and trazodone    DVT prophylaxis: Lovenox Code Status: Full  Family Communication: Discussed with patient Consults called: None Admission status: Inpatient    Vianne Bulls, MD [facility] Pager [phone]  If 7PM-7AM, please contact night-coverage [email or web address] Password [facility]  05/05/2017, 5:35 AM

## 2017-05-05 NOTE — Consult Note (Addendum)
Cardiology Consultation:   Patient ID: [name]; [id number]; 07-14-1945   Admit date: 05/04/2017 Date of Consult: 05/05/2017  Primary Care Provider: Colon Branch, MD Primary Cardiologist: New - seen by Dr. Angelena Form in 2011 Primary Electrophysiologist:  N/A   Patient Profile:   [name] is a 72 y.o. female with a hx of hypertension, anxiety, chronic diastolic heart failure, Barrett's esophagus and morbid obesity who is being seen today for the evaluation of acute on chronic diastolic HF and chest pain with exertion at the request of Dr. Starla Link.  History of Present Illness:   [name] is a pleasant 72 year old female with past medical history of hypertension, anxiety, chronic diastolic heart failure, Barrett's esophagus and morbid obesity.  She was remotely followed by Dr. Angelena Form.  He was initially seen by Dr. Angelena Form in [facility] in December 2011.  She had a normal echocardiogram and a stress Myoview at the time.  Unfortunately according to the patient, she had a severe back pain due to injury later and failed to follow-up with cardiology.  Since the beginning of March, she had episode of cold where she had runny nose and malaise.  She began to notice some exertional shortness of breath and the chest pain since that time.  In the past month, she says she has gained 13 pounds.  She also describes bilateral lower extremity pain with palpation.  Patient is frustrated that now a day she can only walk short distance before she becomes very short of breath.  [name] chest pain occurs both at rest and with exertion, however more noticeable with exertion.  [name] father died in his 39s with congestive heart failure.  [name] mother died with lung cancer.  She did smoke for roughly 10 years between age 35 until age 49, however has not smoked in the past 30 years.   She initially went to the urgent care on 05/02/2017 with complaint of shortness of breath and weight gain.  BNP at the time was  only 125.9.  Due to worsening symptoms, patient returned to the hospital on 05/04/2017 with complaint of exertional chest pain and shortness of breath.  EKG was difficult to interpret due to right bundle branch block which has been present since at least last year.  So far serial troponin was negative x3.   Past Medical History:  Diagnosis Date  . Anemia   . Anxiety   . Barrett's esophagus   . Bilateral carpal tunnel syndrome    Dr. Eddie Dibbles, having injection therapy  . C. difficile diarrhea 08/01/2012   severe 2014  . Chronic cystitis    Dr. Matilde Sprang  . Chronic lower back pain   . Depression   . DJD (degenerative joint disease)    bilateral hands; knees  . Family history of anesthesia complication    Mother had severe N/V  . GERD (gastroesophageal reflux disease)   . H/O cardiac catheterization    (-) cath 12-2002  , cath again 2011 (-)  . HTN (hypertension)   . Hyperlipidemia   . Insomnia   . Migraine   . Osteoporosis    pt unsure of this  . RLS (restless legs syndrome)   . Vitamin B 12 deficiency 04/09/2013    Past Surgical History:  Procedure Laterality Date  . Arm surgery Left    "don't remember what they did; arm wasn't broken"  . BILATERAL KNEE ARTHROSCOPY Bilateral   . CARDIAC CATHETERIZATION  01/22/10   clean cath  .  CATARACT EXTRACTION W/ INTRAOCULAR LENS  IMPLANT, BILATERAL Bilateral   . CHOLECYSTECTOMY N/A 10/25/2016   Procedure: LAPAROSCOPIC CHOLECYSTECTOMY;  Surgeon: Georganna Skeans, MD;  Location: [facility];  Service: General;  Laterality: N/A;  . COLONOSCOPY W/ POLYPECTOMY    . FOOT SURGERY Bilateral    toenails removed; callus removed on right; hammertoes right"  . Knot     "removed from right neck; not a goiter"  . LUMBAR DISC SURGERY     L5 S1 anterior fusion  . OOPHORECTOMY    . SHOULDER ARTHROSCOPY Right   . TOTAL KNEE ARTHROPLASTY  10/05/2011   Procedure: TOTAL KNEE ARTHROPLASTY;  Surgeon: Hessie Dibble, MD;  Location: [facility];  Service: Orthopedics;   Laterality: Right;  [name] VAGINAL HYSTERECTOMY     for endometriosis     Home Medications:  Prior to Admission medications   Medication Sig Start Date End Date Taking? Authorizing Provider  acetaminophen (TYLENOL) 500 MG tablet Take 1,000 mg by mouth every 6 (six) hours as needed for mild pain or headache.   Yes [provider]  aspirin EC 81 MG tablet Take 81 mg by mouth daily.   Yes [provider]  benazepril (LOTENSIN) 20 MG tablet Take 1 tablet (20 mg total) by mouth daily. 03/28/17  Yes Paz, Alda Berthold, MD  clonazePAM (KLONOPIN) 0.5 MG tablet Take 1 tablet (0.5 mg total) by mouth 3 (three) times daily as needed for anxiety. 08/27/14  Yes Paz, Alda Berthold, MD  HYDROcodone-acetaminophen (NORCO) 10-325 MG tablet Take 1 tablet by mouth every 4 (four) hours as needed for moderate pain or severe pain. For back pain 10/26/16  Yes Rai, Ripudeep K, MD  Menthol-Camphor (ICY HOT ADVANCED RELIEF) 16-11 % CREA Apply 1 application topically as needed (SHOULDER PAIN).   Yes [provider]  omeprazole (PRILOSEC) 40 MG capsule Take 40 mg by mouth 2 (two) times daily before a meal.   Yes [provider]  Propylene Glycol (SYSTANE BALANCE OP) Apply 1 drop to eye as needed (DRY EYES).   Yes [provider]  rOPINIRole (REQUIP) 1 MG tablet TAKE 1 TABLET (1 MG TOTAL) BY MOUTH 2 (TWO) TIMES DAILY. 02/25/17  Yes Chesley Mires, MD  tiZANidine (ZANAFLEX) 4 MG tablet Take 4 mg by mouth 2 (two) times daily as needed for muscle spasms.   Yes [provider]  traZODone (DESYREL) 50 MG tablet Take 1-2 tablets (50-100 mg total) by mouth at bedtime. 04/18/17  Yes Colon Branch, MD    Inpatient Medications: Scheduled Meds: . aspirin EC  81 mg Oral Daily  . benazepril  20 mg Oral Daily  . enoxaparin (LOVENOX) injection  40 mg Subcutaneous Q24H  . famotidine  20 mg Oral BID  . furosemide  20 mg Intravenous Q12H  . rOPINIRole  1 mg Oral BID  . sodium chloride flush  3 mL Intravenous  Q12H  . traZODone  50-100 mg Oral QHS   Continuous Infusions: . sodium chloride     PRN Meds: sodium chloride, acetaminophen, clonazePAM, gi cocktail, HYDROcodone-acetaminophen, nitroGLYCERIN, ondansetron (ZOFRAN) IV, sodium chloride flush, tiZANidine  Allergies:    Allergies  Allergen Reactions  . Dextromethorphan-Guaifenesin Nausea And Vomiting  . Morphine Nausea And Vomiting  . Penicillins Rash    "> 30 years ago; best I can remember it was just a light rash on my arm"    Social History:   Social History   Socioeconomic History  . Marital status: Divorced    Spouse  name: Not on file  . Number of children: 1  . Years of education: Not on file  . Highest education level: Not on file  Occupational History  . Occupation: Retired, post Ecologist: RETIRED  Social Needs  . Financial resource strain: Not on file  . Food insecurity:    Worry: Not on file    Inability: Not on file  . Transportation needs:    Medical: Not on file    Non-medical: Not on file  Tobacco Use  . Smoking status: Former Smoker    Packs/day: 0.50    Years: 10.00    Pack years: 5.00    Last attempt to quit: 02/09/1975    Years since quitting: 42.2  . Smokeless tobacco: Never Used  . Tobacco comment: started at age 48.   quit in the 100s.  Substance and Sexual Activity  . Alcohol use: Yes    Alcohol/week: 0.0 oz    Comment: occ  . Drug use: No    Comment: CBD and hemp OIL   . Sexual activity: Not on file  Lifestyle  . Physical activity:    Days per week: Not on file    Minutes per session: Not on file  . Stress: Not on file  Relationships  . Social connections:    Talks on phone: Not on file    Gets together: Not on file    Attends religious service: Not on file    Active member of club or organization: Not on file    Attends meetings of clubs or organizations: Not on file    Relationship status: Not on file  . Intimate partner violence:    Fear of current or ex partner: Not  on file    Emotionally abused: Not on file    Physically abused: Not on file    Forced sexual activity: Not on file  Other Topics Concern  . Not on file  Social History Narrative   Son lives w/ [name]     Family History:    Family History  Problem Relation Age of Onset  . Lung cancer Mother   . Dementia Father   . CAD Father        dx in his 86s  . Stroke Father   . Colon cancer Maternal Grandfather   . Esophageal cancer Brother   . Breast cancer Sister   . Diabetes Other        Aunt  . Colon cancer Maternal Uncle        2  . Rectal cancer Neg Hx   . Stomach cancer Neg Hx      ROS:  Please see the history of present illness.   All other ROS reviewed and negative.     Physical Exam/Data:   Vitals:   05/04/17 2230 05/05/17 0032 05/05/17 0230 05/05/17 0415  BP: 123/69 126/65 (!) 129/52 (!) 155/79  Pulse: 79 91 72 63  Resp: 20 16 17    Temp:    (!) 97.5 F (36.4 C)  TempSrc:    Axillary  SpO2: 97% 96% 99% 96%  Weight:    249 lb 4.8 oz (113.1 kg)  Height:    5\' 6"  (1.676 m)   No intake or output data in the 24 hours ending 05/05/17 0856 Filed Weights   05/04/17 1358 05/05/17 0415  Weight: 251 lb 5.2 oz (114 kg) 249 lb 4.8 oz (113.1 kg)   Body mass index is 40.24 kg/m.  General:  Well nourished, well developed, in no acute distress HEENT: normal Lymph: no adenopathy Neck: no JVD Endocrine:  No thryomegaly Vascular: No carotid bruits; FA pulses 2+ bilaterally without bruits  Cardiac:  normal S1, S2; RRR; no murmur  Lungs:  clear to auscultation bilaterally, no wheezing, rhonchi or rales  Abd: soft, nontender, no hepatomegaly  Ext: no edema Musculoskeletal:  No deformities, BUE and BLE strength normal and equal Skin: warm and dry  Neuro:  CNs 2-12 intact, no focal abnormalities noted Psych:  Normal affect   EKG:  The EKG was personally reviewed and demonstrates: Sinus rhythm with right bundle branch block. Telemetry:  Telemetry was personally reviewed and  demonstrates: Despite machine interpretation, no obvious sign of VT, sinus rhythm with right bundle branch block and occasional PVCs.  Relevant CV Studies:  Pending echocardiogram.  Laboratory Data:  Chemistry Recent Labs  Lab 05/02/17 1719 05/04/17 1408 05/05/17 0555  NA 139 137 140  K 4.1 4.0 4.0  CL 106 105 107  CO2 22 23 22   GLUCOSE 96 116* 106*  BUN 14 16 13   CREATININE 0.87 0.84 0.88  CALCIUM 9.1 8.9 8.6*  GFRNONAA >60 >60 >60  GFRAA >60 >60 >60  ANIONGAP 11 9 11     Recent Labs  Lab 05/05/17 0555  PROT 6.3*  ALBUMIN 3.3*  AST 19  ALT 15  ALKPHOS 74  BILITOT 0.9   Hematology Recent Labs  Lab 05/02/17 1719 05/04/17 1408  WBC 8.9 9.0  RBC 4.24 4.13  HGB 12.3 12.2  HCT 39.5 38.1  MCV 93.2 92.3  MCH 29.0 29.5  MCHC 31.1 32.0  RDW 14.5 14.1  PLT 273 250   Cardiac Enzymes Recent Labs  Lab 05/04/17 1408 05/05/17 0031 05/05/17 0555  TROPONINI <0.03 <0.03 <0.03    Recent Labs  Lab 05/02/17 1736  TROPIPOC 0.02    BNP Recent Labs  Lab 05/02/17 1718 05/05/17 0031  BNP 125.9* 112.5*    DDimer No results for input(s): DDIMER in the last 168 hours.  Radiology/Studies:  Dg Chest 2 View  Result Date: 05/04/2017 CLINICAL DATA:  Mid to left-sided chest pain with shortness of breath for 1 month. History of hypertension. EXAM: CHEST - 2 VIEW COMPARISON:  Radiographs 05/02/2017.  CT 11/05/2016. FINDINGS: The heart size and mediastinal contours are normal. The lungs are clear. There is no pleural effusion or pneumothorax. No acute osseous findings are identified. Postsurgical changes of the distal right clavicle. Mild thoracic spine degenerative changes. IMPRESSION: Stable chest.  No active cardiopulmonary process. Electronically Signed   By: Richardean Sale M.D.   On: 05/04/2017 14:25   Dg Chest 2 View  Result Date: 05/02/2017 CLINICAL DATA:  72 year old female with shortness of breath EXAM: CHEST - 2 VIEW COMPARISON:  Chest CT dated 11/05/2016 FINDINGS:  The heart size and mediastinal contours are within normal limits. Both lungs are clear. The visualized skeletal structures are unremarkable. IMPRESSION: No active cardiopulmonary disease. Electronically Signed   By: Anner Crete M.D.   On: 05/02/2017 21:33   Ct Head Wo Contrast  Result Date: 05/04/2017 CLINICAL DATA:  Headache EXAM: CT HEAD WITHOUT CONTRAST TECHNIQUE: Contiguous axial images were obtained from the base of the skull through the vertex without intravenous contrast. COMPARISON:  07/25/2006 FINDINGS: Brain: No evidence of acute infarction, hemorrhage, hydrocephalus, extra-axial collection or mass lesion/mass effect. Vascular: No hyperdense vessel or unexpected calcification. Skull: Normal. Negative for fracture or focal lesion. Sinuses/Orbits: The visualized paranasal sinuses are essentially clear. The  mastoid air cells are unopacified. Other: None. IMPRESSION: Normal head CT. Electronically Signed   By: Julian Hy M.D.   On: 05/04/2017 18:09    Assessment and Plan:   1. Acute on chronic diastolic HF: [name] weight back in January was 242 pounds, [name] admission weight was 251 pounds.  Although there was some suspicion of acute on chronic diastolic heart failure, however it is not obvious on physical exam.  It is difficult to assess [name] overall volume status given the morbid obesity.  I would recommend continue with IV diuresis as much as [name] renal function is able to tolerate.  2. Dyspnea on exertion and exertional chest pain: Unclear if related to volume overload, however is a exertional component of symptom is very concerning.  She had a CT angiogram of the chest in 2018, there appears to be some calcification in the mid to distal LAD territory.  Would proceed with diuresis and also obtain repeat echocardiogram.  If she continue to be symptomatic after diuresis, she will need left and right cardiac catheterization  3. Hypertension: Blood pressure fairly controlled on benazepril.   May also consider addition of low-dose metoprolol later to help with suppression of PVCs seen on telemetry.   For questions or updates, please contact [facility] Please consult [email or web address] for contact info under Cardiology/STEMI.   Hilbert Corrigan, [place]  05/05/2017 8:56 AM   Personally seen and examined. Agree with above.  72 year old female here with chest pain, shortness of breath, hypertension, morbid obesity.  Lab work reveals normal troponin.  Currently she is feeling better but still some shortness of breath with activity.  Telemetry notes sinus tachycardia with minimal activity, heart rate in the 130s.  She had occasional ectopic beats, PVCs.  No ventricular tachycardia.  Previously [name] echocardiogram in 2011 showed normal ejection fraction.  Exam: Alert and oriented x3, overweight, tachycardic regular rhythm no appreciable murmurs, no significant JVD appreciated, lungs minimal blunting at the bases bilaterally, abdomen soft, extremities 1+ edema.  Assessment and plan:  Acute diastolic heart failure -We will repeat echocardiogram to ensure that she does not have a systolic component.  Continue with IV diuresis.  Goal is 1-2 L/day net out.  Exertional chest pain -Troponin is currently normal.  No ischemic changes on ECG.  If after diuresis she continues to have exertional discomfort, next step would be to pursue cardiac catheterization.  Personally reviewed [name] CT scan of chest previously which shows proximal to mid LAD coronary artery calcification with some circumflex calcification as well proximally.  Morbid obesity -Continue to encourage weight loss.  Candee Furbish, MD

## 2017-05-05 NOTE — Plan of Care (Signed)
  Problem: Education: Goal: Knowledge of General Education information will improve Outcome: Completed/Met  Pt oriented to unit, plan of care, and method of reporting concerns. Verbalizes understanding of need to call for assistance prior to ambulation when necessary. Call light and bedside table within reach.

## 2017-05-06 ENCOUNTER — Inpatient Hospital Stay (HOSPITAL_COMMUNITY): Payer: Medicare Other

## 2017-05-06 ENCOUNTER — Encounter (HOSPITAL_COMMUNITY)
Admission: EM | Disposition: A | Discharge status: Home / Self Care | Payer: Self-pay | Source: Home / Self Care | Attending: Internal Medicine

## 2017-05-06 ENCOUNTER — Inpatient Hospital Stay (HOSPITAL_COMMUNITY)
Admission: EM | Disposition: A | Discharge status: Home / Self Care | Payer: Self-pay | Source: Home / Self Care | Attending: Internal Medicine

## 2017-05-06 DIAGNOSIS — I200 Unstable angina: Secondary | ICD-10-CM

## 2017-05-06 DIAGNOSIS — I509 Heart failure, unspecified: Secondary | ICD-10-CM

## 2017-05-06 HISTORY — PX: RIGHT/LEFT HEART CATH AND CORONARY ANGIOGRAPHY: CATH118266

## 2017-05-06 LAB — BASIC METABOLIC PANEL
Anion gap: 9 (ref 5–15)
BUN: 15 mg/dL (ref 6–20)
CO2: 26 mmol/L (ref 22–32)
Calcium: 8.7 mg/dL — ABNORMAL LOW (ref 8.9–10.3)
Chloride: 105 mmol/L (ref 101–111)
Creatinine, Ser: 1.08 mg/dL — ABNORMAL HIGH (ref 0.44–1.00)
GFR calc Af Amer: 58 mL/min — ABNORMAL LOW (ref >60)
GFR calc non Af Amer: 50 mL/min — ABNORMAL LOW (ref >60)
Glucose, Bld: 111 mg/dL — ABNORMAL HIGH (ref 65–99)
Potassium: 3.8 mmol/L (ref 3.5–5.1)
Sodium: 140 mmol/L (ref 135–145)

## 2017-05-06 LAB — POCT I-STAT 3, VENOUS BLOOD GAS (G3P V)
Acid-Base Excess: 1 mmol/L (ref 0.0–2.0)
Bicarbonate: 27 mmol/L (ref 20.0–28.0)
O2 Saturation: 65 %
TCO2: 28 mmol/L (ref 22–32)
pCO2, Ven: 46.4 mmHg (ref 44.0–60.0)
pH, Ven: 7.373 (ref 7.250–7.430)
pO2, Ven: 35 mmHg (ref 32.0–45.0)

## 2017-05-06 LAB — CBC WITH DIFFERENTIAL/PLATELET
Basophils Absolute: 0 10*3/uL (ref 0.0–0.1)
Basophils Relative: 0 %
Eosinophils Absolute: 0.1 10*3/uL (ref 0.0–0.7)
Eosinophils Relative: 1 %
HCT: 37.8 % (ref 36.0–46.0)
Hemoglobin: 12 g/dL (ref 12.0–15.0)
Lymphocytes Relative: 35 %
Lymphs Abs: 2.7 10*3/uL (ref 0.7–4.0)
MCH: 29.1 pg (ref 26.0–34.0)
MCHC: 31.7 g/dL (ref 30.0–36.0)
MCV: 91.5 fL (ref 78.0–100.0)
Monocytes Absolute: 0.5 10*3/uL (ref 0.1–1.0)
Monocytes Relative: 7 %
Neutro Abs: 4.5 10*3/uL (ref 1.7–7.7)
Neutrophils Relative %: 57 %
Platelets: 232 10*3/uL (ref 150–400)
RBC: 4.13 MIL/uL (ref 3.87–5.11)
RDW: 14.2 % (ref 11.5–15.5)
WBC: 7.9 10*3/uL (ref 4.0–10.5)

## 2017-05-06 LAB — PROTIME-INR
INR: 1.06
Prothrombin Time: 13.7 seconds (ref 11.4–15.2)

## 2017-05-06 LAB — POCT I-STAT 3, ART BLOOD GAS (G3+)
Acid-Base Excess: 1 mmol/L (ref 0.0–2.0)
Bicarbonate: 25.1 mmol/L (ref 20.0–28.0)
O2 Saturation: 98 %
TCO2: 26 mmol/L (ref 22–32)
pCO2 arterial: 38.4 mmHg (ref 32.0–48.0)
pH, Arterial: 7.424 (ref 7.350–7.450)
pO2, Arterial: 94 mmHg (ref 83.0–108.0)

## 2017-05-06 LAB — ECHOCARDIOGRAM COMPLETE
Height: 66 in
Weight: 3902.4 oz

## 2017-05-06 LAB — MAGNESIUM: Magnesium: 1.8 mg/dL (ref 1.7–2.4)

## 2017-05-06 SURGERY — RIGHT/LEFT HEART CATH AND CORONARY ANGIOGRAPHY
Anesthesia: LOCAL

## 2017-05-06 MED ORDER — HEPARIN SODIUM (PORCINE) 1000 UNIT/ML IJ SOLN
INTRAMUSCULAR | Status: AC
  Filled 2017-05-06: qty 1

## 2017-05-06 MED ORDER — IOPAMIDOL (ISOVUE-370) INJECTION 76%
INTRAVENOUS | Status: AC
  Filled 2017-05-06: qty 125

## 2017-05-06 MED ORDER — MIDAZOLAM HCL 2 MG/2ML IJ SOLN
INTRAMUSCULAR | Status: DC | PRN
  Administered 2017-05-06 (×2): 0.5 mg via INTRAVENOUS

## 2017-05-06 MED ORDER — METOPROLOL TARTRATE 25 MG PO TABS
25.0000 mg | ORAL_TABLET | Freq: Two times a day (BID) | ORAL | Status: DC
Start: 2017-05-06 — End: 2017-05-07
  Administered 2017-05-06 (×2): 25 mg via ORAL
  Filled 2017-05-06 (×2): qty 1

## 2017-05-06 MED ORDER — LIDOCAINE HCL (PF) 1 % IJ SOLN
INTRAMUSCULAR | Status: DC | PRN
  Administered 2017-05-06: 5 mL via SUBCUTANEOUS

## 2017-05-06 MED ORDER — HEPARIN (PORCINE) IN NACL 2-0.9 UNIT/ML-% IJ SOLN
INTRAMUSCULAR | Status: AC
  Filled 2017-05-06: qty 1000

## 2017-05-06 MED ORDER — HEPARIN (PORCINE) IN NACL 2-0.9 UNIT/ML-% IJ SOLN
INTRAMUSCULAR | Status: AC | PRN
  Administered 2017-05-06 (×2): 500 mL

## 2017-05-06 MED ORDER — MIDAZOLAM HCL 2 MG/2ML IJ SOLN
INTRAMUSCULAR | Status: AC
  Filled 2017-05-06: qty 2

## 2017-05-06 MED ORDER — SODIUM CHLORIDE 0.9 % IV SOLN: 250.0000 mL | INTRAVENOUS | Status: DC | PRN

## 2017-05-06 MED ORDER — FENTANYL CITRATE (PF) 100 MCG/2ML IJ SOLN
INTRAMUSCULAR | Status: DC | PRN
  Administered 2017-05-06 (×2): 25 ug via INTRAVENOUS

## 2017-05-06 MED ORDER — IOPAMIDOL (ISOVUE-370) INJECTION 76%
INTRAVENOUS | Status: DC | PRN
  Administered 2017-05-06: 75 mL via INTRA_ARTERIAL

## 2017-05-06 MED ORDER — SODIUM CHLORIDE 0.9% FLUSH
3.0000 mL | Freq: Two times a day (BID) | INTRAVENOUS | Status: DC
  Administered 2017-05-07: 3 mL via INTRAVENOUS

## 2017-05-06 MED ORDER — SODIUM CHLORIDE 0.9 % IV SOLN
INTRAVENOUS | Status: DC
  Administered 2017-05-06: 11:00:00 via INTRAVENOUS

## 2017-05-06 MED ORDER — FUROSEMIDE 20 MG PO TABS: 20.0000 mg | ORAL_TABLET | Freq: Every day | ORAL | Status: DC

## 2017-05-06 MED ORDER — VERAPAMIL HCL 2.5 MG/ML IV SOLN
INTRAVENOUS | Status: AC
  Filled 2017-05-06: qty 2

## 2017-05-06 MED ORDER — SODIUM CHLORIDE 0.9% FLUSH: 3.0000 mL | INTRAVENOUS | Status: DC | PRN

## 2017-05-06 MED ORDER — PERFLUTREN LIPID MICROSPHERE
INTRAVENOUS | Status: AC
  Administered 2017-05-06: 3 mL
  Filled 2017-05-06: qty 10

## 2017-05-06 MED ORDER — VERAPAMIL HCL 2.5 MG/ML IV SOLN
INTRAVENOUS | Status: DC | PRN
  Administered 2017-05-06: 10 mL via INTRA_ARTERIAL

## 2017-05-06 MED ORDER — SODIUM CHLORIDE 0.9 % IV SOLN: INTRAVENOUS | Status: AC

## 2017-05-06 MED ORDER — VERAPAMIL HCL 2.5 MG/ML IV SOLN
INTRA_ARTERIAL | Status: DC | PRN
  Administered 2017-05-06: 10 mL via INTRA_ARTERIAL

## 2017-05-06 MED ORDER — HEPARIN SODIUM (PORCINE) 1000 UNIT/ML IJ SOLN
INTRAMUSCULAR | Status: DC | PRN
  Administered 2017-05-06: 5000 [IU] via INTRAVENOUS

## 2017-05-06 MED ORDER — FENTANYL CITRATE (PF) 100 MCG/2ML IJ SOLN
INTRAMUSCULAR | Status: AC
  Filled 2017-05-06: qty 2

## 2017-05-06 MED ORDER — LIDOCAINE HCL 1 % IJ SOLN
INTRAMUSCULAR | Status: AC
  Filled 2017-05-06: qty 20

## 2017-05-06 SURGICAL SUPPLY — 17 items
BAND CMPR LRG ZPHR (HEMOSTASIS) ×1
BAND ZEPHYR COMPRESS 30 LONG (HEMOSTASIS) ×1 IMPLANT
CATH IMPULSE 5F ANG/FL3.5 (CATHETERS) ×1 IMPLANT
CATH INFINITI 4FR 145 PIGTAIL (CATHETERS) ×1 IMPLANT
CATH SWAN GANZ 7F STRAIGHT (CATHETERS) ×1 IMPLANT
COVER PRB 48X5XTLSCP FOLD TPE (BAG) IMPLANT
COVER PROBE 5X48 (BAG) ×2
GLIDESHEATH SLENDER 7FR .021G (SHEATH) ×1 IMPLANT
GUIDEWIRE INQWIRE 1.5J.035X260 (WIRE) IMPLANT
INQWIRE 1.5J .035X260CM (WIRE) ×2
KIT HEART LEFT (KITS) ×2 IMPLANT
KIT MICROPUNCTURE NIT STIFF (SHEATH) ×1 IMPLANT
PACK CARDIAC CATHETERIZATION (CUSTOM PROCEDURE TRAY) ×2 IMPLANT
SHEATH RAIN RADIAL 21G 6FR (SHEATH) ×1 IMPLANT
SYR MEDRAD MARK V 150ML (SYRINGE) ×2 IMPLANT
TRANSDUCER W/STOPCOCK (MISCELLANEOUS) ×2 IMPLANT
TUBING CIL FLEX 10 FLL-RA (TUBING) ×2 IMPLANT

## 2017-05-06 NOTE — Progress Notes (Signed)
Nutrition Education Note  RD consulted for nutrition education regarding new onset CHF.  RD provided "Low Sodium Nutrition Therapy" handout from the Academy of [employer] and Dietetics. Reviewed patient's dietary recall. Provided examples on ways to decrease sodium intake in diet. Discouraged intake of processed foods and use of salt shaker. Encouraged fresh fruits and vegetables as well as whole grain sources of carbohydrates to maximize fiber intake.   RD discussed why it is important for patient to adhere to diet recommendations, and emphasized the role of fluids, foods to avoid, and importance of weighing self daily. Teach back method used.  Expect good compliance.  Body mass index is 39.37 kg/m. Pt meets criteria for class 2 obesity based on current BMI.  Current diet order is heart heatlhy, patient is consuming approximately 100% of meals at this time. Labs and medications reviewed. No further nutrition interventions warranted at this time. RD contact information provided. If additional nutrition issues arise, please re-consult RD.   Molli Barrows, RD, LDN, [place] Pager [phone] After Hours Pager [phone]

## 2017-05-06 NOTE — Progress Notes (Signed)
  Echocardiogram 2D Echocardiogram has been performed.  [name] 05/06/2017, 11:09 AM

## 2017-05-06 NOTE — Care Management Note (Signed)
Case Management Note  Patient Details  Name: [name] MRN: [id number] Date of Birth: August 06, 1945  Subjective/Objective: Pt presented for Acute on Chronic CHF- PTA from home with support of son. CM received referral for [facility] needs.                   Action/Plan: CM did discuss the option of HH Services. Pt states she ambulates well and she gets her medications appropriately- she has a pill box that she can use for her medications. Pt declined [facility] at this time. CM did make her aware to contact PCP if she needs services in the future.   Expected Discharge Date:                  Expected Discharge Plan:  Home/Self Care  In-House Referral:  NA  Discharge planning Services  CM Consult  Post Acute Care Choice:  NA Choice offered to:  NA  DME Arranged:  N/A DME Agency:  NA  HH Arranged:  NA HH Agency:  NA  Status of Service:  Completed, signed off  If discussed at [place] of Stay Meetings, dates discussed:    Additional Comments:  Alexa Roys, RN 05/06/2017, 1:07 PM

## 2017-05-06 NOTE — Brief Op Note (Addendum)
BRIEF CARDIAC CATHETERIZATION NOTE  DATE: 05/06/2017 TIME: 1:40 PM  PATIENT:  [name]  72 y.o. female  PRE-OPERATIVE DIAGNOSIS:  Chest pain and heart failure  POST-OPERATIVE DIAGNOSIS:  Acute systolic heart failure  PROCEDURE:  Procedure(s): RIGHT/LEFT HEART CATH AND CORONARY ANGIOGRAPHY (N/A)  SURGEON:  Surgeon(s) and Role:    * Kyre Jeffries, MD - Primary  FINDINGS: 1. No significant CAD. 2. Upper normal/mildly elevated left and right heart filling pressures. 3. Low normal to mildly decreased cardiac output/index. 4. Severely reduced systolic function; LVEF ~24-46%.  RECOMMENDATIONS: 1. Medical therapy with optimization of evidence-based heart failure therapy.  Nelva Bush, MD [facility] Pager: [phone]

## 2017-05-06 NOTE — Progress Notes (Addendum)
Patient ID: ILAH BOULE, female   DOB: 01/01/1946, 72 y.o.   MRN: [id number]  PROGRESS NOTE    Miyoko Hashimi [facility]  BJS:[id number] DOB: 28-Jul-1945 DOA: 05/04/2017 PCP: Colon Branch, MD   Brief Narrative:  72 year old female with history of hypertension, anxiety, chronic pain, Barrett's esophagus and chronic diastolic CHF presented with shortness of breath, weight gain and chest discomfort.  She was admitted with acute CHF exacerbation and started on intravenous Lasix.  Cardiology was consulted.   Assessment & Plan:   Principal Problem:   Acute on chronic diastolic CHF (congestive heart failure) (HCC) Active Problems:   Hypertension   BARRETTS ESOPHAGUS   Chest pain   Chronic diastolic CHF (congestive heart failure) (HCC)   Headache   1. Acute on chronic diastolic CHF  -Cardiology following.  Echo pending -Continue strict input and output.  Daily weights.  Negative balance of 1460 cc since admission -Continue Lasix IV along with aspirin and benazepril  2. Chest discomfort  - Probably secondary to above.  Improving with diuresis.  Troponins have been negative so far -Echo pending  3. Hypertension  - BP at goal  - Continue benazepril   and Lasix  4. Barrett esophagus  - Pathology from 2017 consistent with esophagitis, negative for malignancy  - Not taking PPI or H2-blocker at home - Continue famotidine.  Outpatient follow-up with GI  5. Headache  - Pt has hx of migraine, had HA in ED, head CT normal, and HA has since resolved    6. Anxiety; insomnia  - Continue Klonopin and trazodone    DVT prophylaxis: Lovenox Code Status: Full  Family Communication: Discussed with patient.  No family at bedside Disposition Plan: Home in 1-2 days once cleared by cardiology  Consultants: Cardiology  Procedures: Echo pending  Antimicrobials: None   Subjective: Patient seen and examined at bedside.  She feels much better and is urinating well.  No overnight fever, nausea or  vomiting.  Her chest discomfort is improving.  Objective: Vitals:   05/05/17 1402 05/05/17 2056 05/06/17 0512 05/06/17 0730  BP: 120/81 132/72 123/68 99/60  Pulse: 92 89 100   Resp:  18 17   Temp: 97.6 F (36.4 C) 99.6 F (37.6 C) 98.9 F (37.2 C) (!) 97.4 F (36.3 C)  TempSrc: Axillary Oral Oral Oral  SpO2: 96% 99% 100%   Weight:   110.6 kg (243 lb 14.4 oz)   Height:        Intake/Output Summary (Last 24 hours) at 05/06/2017 0939 Last data filed at 05/06/2017 0600 Gross per 24 hour  Intake 600 ml  Output 1400 ml  Net -800 ml   Filed Weights   05/04/17 1358 05/05/17 0415 05/06/17 0512  Weight: 114 kg (251 lb 5.2 oz) 113.1 kg (249 lb 4.8 oz) 110.6 kg (243 lb 14.4 oz)    Examination:  General exam: Appears calm and comfortable.  No distress Respiratory system: Bilateral decreased breath sound at bases with some basilar crackles Cardiovascular system: S1 & S2 heard, rate controlled  gastrointestinal system: Abdomen is nondistended, soft and nontender. Normal bowel sounds heard. Extremities: No cyanosis, clubbing; trace edema   Data Reviewed: I have personally reviewed following labs and imaging studies  CBC: Recent Labs  Lab 05/02/17 1719 05/04/17 1408 05/06/17 0442  WBC 8.9 9.0 7.9  NEUTROABS 5.2  --  4.5  HGB 12.3 12.2 12.0  HCT 39.5 38.1 37.8  MCV 93.2 92.3 91.5  PLT 273 250 232   Basic  Metabolic Panel: Recent Labs  Lab 05/02/17 1719 05/04/17 1408 05/05/17 0555 05/06/17 0442  NA 139 137 140 140  K 4.1 4.0 4.0 3.8  CL 106 105 107 105  CO2 22 23 22 26   GLUCOSE 96 116* 106* 111*  BUN 14 16 13 15   CREATININE 0.87 0.84 0.88 1.08*  CALCIUM 9.1 8.9 8.6* 8.7*  MG  --   --   --  1.8   GFR: Estimated Creatinine Clearance: 59.3 mL/min (A) (by C-G formula based on SCr of 1.08 mg/dL (H)). Liver Function Tests: Recent Labs  Lab 05/05/17 0555  AST 19  ALT 15  ALKPHOS 74  BILITOT 0.9  PROT 6.3*  ALBUMIN 3.3*   Recent Labs  Lab 05/05/17 0555    LIPASE 20   No results for input(s): AMMONIA in the last 168 hours. Coagulation Profile: No results for input(s): INR, PROTIME in the last 168 hours. Cardiac Enzymes: Recent Labs  Lab 05/04/17 1408 05/05/17 0031 05/05/17 0555  TROPONINI <0.03 <0.03 <0.03   BNP (last 3 results) No results for input(s): PROBNP in the last 8760 hours. HbA1C: No results for input(s): HGBA1C in the last 72 hours. CBG: No results for input(s): GLUCAP in the last 168 hours. Lipid Profile: No results for input(s): CHOL, HDL, LDLCALC, TRIG, CHOLHDL, LDLDIRECT in the last 72 hours. Thyroid Function Tests: No results for input(s): TSH, T4TOTAL, FREET4, T3FREE, THYROIDAB in the last 72 hours. Anemia Panel: No results for input(s): VITAMINB12, FOLATE, FERRITIN, TIBC, IRON, RETICCTPCT in the last 72 hours. Sepsis Labs: No results for input(s): PROCALCITON, LATICACIDVEN in the last 168 hours.  No results found for this or any previous visit (from the past 240 hour(s)).       Radiology Studies: Dg Chest 2 View  Result Date: 05/04/2017 CLINICAL DATA:  Mid to left-sided chest pain with shortness of breath for 1 month. History of hypertension. EXAM: CHEST - 2 VIEW COMPARISON:  Radiographs 05/02/2017.  CT 11/05/2016. FINDINGS: The heart size and mediastinal contours are normal. The lungs are clear. There is no pleural effusion or pneumothorax. No acute osseous findings are identified. Postsurgical changes of the distal right clavicle. Mild thoracic spine degenerative changes. IMPRESSION: Stable chest.  No active cardiopulmonary process. Electronically Signed   By: Richardean Sale M.D.   On: 05/04/2017 14:25   Ct Head Wo Contrast  Result Date: 05/04/2017 CLINICAL DATA:  Headache EXAM: CT HEAD WITHOUT CONTRAST TECHNIQUE: Contiguous axial images were obtained from the base of the skull through the vertex without intravenous contrast. COMPARISON:  07/25/2006 FINDINGS: Brain: No evidence of acute infarction,  hemorrhage, hydrocephalus, extra-axial collection or mass lesion/mass effect. Vascular: No hyperdense vessel or unexpected calcification. Skull: Normal. Negative for fracture or focal lesion. Sinuses/Orbits: The visualized paranasal sinuses are essentially clear. The mastoid air cells are unopacified. Other: None. IMPRESSION: Normal head CT. Electronically Signed   By: Julian Hy M.D.   On: 05/04/2017 18:09        Scheduled Meds: . aspirin EC  81 mg Oral Daily  . benazepril  20 mg Oral Daily  . enoxaparin (LOVENOX) injection  40 mg Subcutaneous Q24H  . famotidine  20 mg Oral BID  . furosemide  20 mg Intravenous Q12H  . rOPINIRole  1 mg Oral BID  . sodium chloride flush  3 mL Intravenous Q12H  . traZODone  50-100 mg Oral QHS   Continuous Infusions: . sodium chloride       LOS: 2 days  Aline August, MD [facility] Pager [phone]  If 7PM-7AM, please contact night-coverage [email or web address] Password [facility] 05/06/2017, 9:39 AM

## 2017-05-06 NOTE — Plan of Care (Signed)
  Problem: Activity: Goal: Capacity to carry out activities will improve Outcome: Completed/Met   Problem: Activity: Goal: Risk for activity intolerance will decrease Outcome: Completed/Met  Patient up ad lib. Ambulates independently with steady gait

## 2017-05-06 NOTE — Progress Notes (Signed)
PT Cancellation Note  Patient Details Name: EZRI FANGUY MRN: [id number] DOB: [birth year]   Cancelled Treatment:    Reason Eval/Treat Not Completed: Patient at procedure or test/unavailable.  Pt leaving for CATH lab on arrival.  Will try back later as able. 05/06/2017  Donnella Sham, [place] [phone]  (pager)   Tessie Fass Keylen Uzelac 05/06/2017, 12:33 PM

## 2017-05-06 NOTE — Plan of Care (Signed)
  Problem: Pain Managment: Goal: General experience of comfort will improve Outcome: Completed/Met  Patient denies pain. Displays no signs or symptoms of distress

## 2017-05-06 NOTE — Interval H&P Note (Signed)
History and Physical Interval Note:  05/06/2017 12:47 PM  [name]  has presented today for cardiac catheterization, with the diagnosis of unstable angina and heart failure. The various methods of treatment have been discussed with the patient and family. After consideration of risks, benefits and other options for treatment, the patient has consented to  Procedure(s): RIGHT/LEFT HEART CATH AND CORONARY ANGIOGRAPHY (N/A) as a surgical intervention .  The patient's history has been reviewed, patient examined, no change in status, stable for surgery.  I have reviewed the patient's chart and labs.  Questions were answered to the patient's satisfaction.    Cath Lab Visit (complete for each Cath Lab visit)  Clinical Evaluation Leading to the Procedure:   ACS: Yes.   (unstable angina and acute diastolic heart failure)  Non-ACS:  N/A  [name]

## 2017-05-06 NOTE — H&P (View-Only) (Signed)
Progress Note  Patient Name: [name] Date of Encounter: 05/06/2017  Primary Cardiologist:  Candee Furbish, MD  Subjective   Edema is better, CP is better, still cannot catch her breath when she gets out of bed. She feels her heart beat fast at times, it may be a little fast now.  Inpatient Medications    Scheduled Meds: . aspirin EC  81 mg Oral Daily  . benazepril  20 mg Oral Daily  . enoxaparin (LOVENOX) injection  40 mg Subcutaneous Q24H  . famotidine  20 mg Oral BID  . furosemide  20 mg Intravenous Q12H  . rOPINIRole  1 mg Oral BID  . sodium chloride flush  3 mL Intravenous Q12H  . traZODone  50-100 mg Oral QHS   Continuous Infusions: . sodium chloride     PRN Meds: sodium chloride, acetaminophen, clonazePAM, gi cocktail, HYDROcodone-acetaminophen, loperamide, nitroGLYCERIN, ondansetron (ZOFRAN) IV, sodium chloride flush, tiZANidine   Vital Signs    Vitals:   05/05/17 1402 05/05/17 2056 05/06/17 0512 05/06/17 0730  BP: 120/81 132/72 123/68 99/60  Pulse: 92 89 100   Resp:  18 17   Temp: 97.6 F (36.4 C) 99.6 F (37.6 C) 98.9 F (37.2 C) (!) 97.4 F (36.3 C)  TempSrc: Axillary Oral Oral Oral  SpO2: 96% 99% 100%   Weight:   243 lb 14.4 oz (110.6 kg)   Height:        Intake/Output Summary (Last 24 hours) at 05/06/2017 0935 Last data filed at 05/06/2017 0600 Gross per 24 hour  Intake 600 ml  Output 1400 ml  Net -800 ml   Filed Weights   05/04/17 1358 05/05/17 0415 05/06/17 0512  Weight: 251 lb 5.2 oz (114 kg) 249 lb 4.8 oz (113.1 kg) 243 lb 14.4 oz (110.6 kg)    Telemetry    SR, ST w /frequent PVCs - Personally Reviewed  ECG    03/27, SR, RBBB - Personally Reviewed  Physical Exam   General: Well developed, well nourished, female appearing in no acute distress. Head: Normocephalic, atraumatic.  Neck: Supple without bruits, JVD not seen elevated but difficult to assess 2nd body habitus. Lungs:  Resp regular and unlabored, decreased BS bases w/  few rales. Heart: RRR, S1, S2, no S3, S4, or murmur; no rub. Abdomen: Soft, non-tender, non-distended with normoactive bowel sounds. No hepatomegaly. No rebound/guarding. No obvious abdominal masses. Extremities: No clubbing, cyanosis, trace edema. Distal pedal pulses are 2+ bilaterally. Neuro: Alert and oriented X 3. Moves all extremities spontaneously. Psych: Normal affect.  Labs    Hematology Recent Labs  Lab 05/02/17 1719 05/04/17 1408 05/06/17 0442  WBC 8.9 9.0 7.9  RBC 4.24 4.13 4.13  HGB 12.3 12.2 12.0  HCT 39.5 38.1 37.8  MCV 93.2 92.3 91.5  MCH 29.0 29.5 29.1  MCHC 31.1 32.0 31.7  RDW 14.5 14.1 14.2  PLT 273 250 232    Chemistry Recent Labs  Lab 05/04/17 1408 05/05/17 0555 05/06/17 0442  NA 137 140 140  K 4.0 4.0 3.8  CL 105 107 105  CO2 23 22 26   GLUCOSE 116* 106* 111*  BUN 16 13 15   CREATININE 0.84 0.88 1.08*  CALCIUM 8.9 8.6* 8.7*  PROT  --  6.3*  --   ALBUMIN  --  3.3*  --   AST  --  19  --   ALT  --  15  --   ALKPHOS  --  74  --   BILITOT  --  0.9  --   GFRNONAA >60 >60 50*  GFRAA >60 >60 58*  ANIONGAP 9 11 9      Cardiac Enzymes Recent Labs  Lab 05/04/17 1408 05/05/17 0031 05/05/17 0555  TROPONINI <0.03 <0.03 <0.03    Recent Labs  Lab 05/02/17 1736  TROPIPOC 0.02     BNP Recent Labs  Lab 05/02/17 1718 05/05/17 0031  BNP 125.9* 112.5*     Radiology    Dg Chest 2 View  Result Date: 05/04/2017 CLINICAL DATA:  Mid to left-sided chest pain with shortness of breath for 1 month. History of hypertension. EXAM: CHEST - 2 VIEW COMPARISON:  Radiographs 05/02/2017.  CT 11/05/2016. FINDINGS: The heart size and mediastinal contours are normal. The lungs are clear. There is no pleural effusion or pneumothorax. No acute osseous findings are identified. Postsurgical changes of the distal right clavicle. Mild thoracic spine degenerative changes. IMPRESSION: Stable chest.  No active cardiopulmonary process. Electronically Signed   By: Richardean Sale M.D.   On: 05/04/2017 14:25   Dg Chest 2 View  Result Date: 05/02/2017 CLINICAL DATA:  72 year old female with shortness of breath EXAM: CHEST - 2 VIEW COMPARISON:  Chest CT dated 11/05/2016 FINDINGS: The heart size and mediastinal contours are within normal limits. Both lungs are clear. The visualized skeletal structures are unremarkable. IMPRESSION: No active cardiopulmonary disease. Electronically Signed   By: Anner Crete M.D.   On: 05/02/2017 21:33   Ct Head Wo Contrast  Result Date: 05/04/2017 CLINICAL DATA:  Headache EXAM: CT HEAD WITHOUT CONTRAST TECHNIQUE: Contiguous axial images were obtained from the base of the skull through the vertex without intravenous contrast. COMPARISON:  07/25/2006 FINDINGS: Brain: No evidence of acute infarction, hemorrhage, hydrocephalus, extra-axial collection or mass lesion/mass effect. Vascular: No hyperdense vessel or unexpected calcification. Skull: Normal. Negative for fracture or focal lesion. Sinuses/Orbits: The visualized paranasal sinuses are essentially clear. The mastoid air cells are unopacified. Other: None. IMPRESSION: Normal head CT. Electronically Signed   By: Julian Hy M.D.   On: 05/04/2017 18:09     Cardiac Studies   ECHO:  ordered  Patient Profile     72 y.o. female w/ hx hypertension, anxiety, chronic diastolic heart failure, Barrett's esophagus and morbid obesity who was admitted 03/28 for acute on chronic diastolic HF and CP  Assessment & Plan    Principal Problem: 1.  Acute on chronic diastolic CHF (congestive heart failure) (HCC) - dry weight unclear but likely about 242 lbs. - admit wt 251>>249>>243, so is close - however, DOE is not improved, will ask staff to ck O2 sats w / ambulation - obesity and deconditioning may play a role in DOE as well. - however, to settle DOE and CP issues, may need R/L heart cath  Active Problems: 2.  Hypertension - per IM, good control on current rx  3.  BARRETTS  ESOPHAGUS - per IM  4.  Chest pain - atypical and ez neg MI - MD advise on further eval.  5.  Chronic diastolic CHF (congestive heart failure) ([place]) - see above  6.  Headache - per IM - CT ok  SignedRosaria Ferries , PA-C 9:35 AM 05/06/2017 Pager: [phone]  Personally seen and examined. Agree with above.  72 year old female with many months of dyspnea on exertion, mild tachycardia, PE negative CT in 9/18, morbid obesity.   Good UOP -1.4, weight 251 to 243. (Dry 242ish)  Exam: fairly comfortable in bed. Still with DOE. Mildly tachy. (when watching TV, anxious,  HR can increase to 130). Lungs fairly clear, abd obese, trace edema  A/P  Acute on chronic diastolic HF  - combination of deconditioning, morbid obesity. On IV lasix.   - negative PE 10/2016  - I think it makes sense to proceed with heart cath, right and left to get down to the bottom of any potential CAD (has LAD calcification). Are we near euvolemia? Creat slightly elevated.   - will start metoprolol 25 BID  Candee Furbish, MD

## 2017-05-06 NOTE — Progress Notes (Addendum)
Progress Note  Patient Name: [name] Date of Encounter: 05/06/2017  Primary Cardiologist:  Candee Furbish, MD  Subjective   Edema is better, CP is better, still cannot catch her breath when she gets out of bed. She feels her heart beat fast at times, it may be a little fast now.  Inpatient Medications    Scheduled Meds: . aspirin EC  81 mg Oral Daily  . benazepril  20 mg Oral Daily  . enoxaparin (LOVENOX) injection  40 mg Subcutaneous Q24H  . famotidine  20 mg Oral BID  . furosemide  20 mg Intravenous Q12H  . rOPINIRole  1 mg Oral BID  . sodium chloride flush  3 mL Intravenous Q12H  . traZODone  50-100 mg Oral QHS   Continuous Infusions: . sodium chloride     PRN Meds: sodium chloride, acetaminophen, clonazePAM, gi cocktail, HYDROcodone-acetaminophen, loperamide, nitroGLYCERIN, ondansetron (ZOFRAN) IV, sodium chloride flush, tiZANidine   Vital Signs    Vitals:   05/05/17 1402 05/05/17 2056 05/06/17 0512 05/06/17 0730  BP: 120/81 132/72 123/68 99/60  Pulse: 92 89 100   Resp:  18 17   Temp: 97.6 F (36.4 C) 99.6 F (37.6 C) 98.9 F (37.2 C) (!) 97.4 F (36.3 C)  TempSrc: Axillary Oral Oral Oral  SpO2: 96% 99% 100%   Weight:   243 lb 14.4 oz (110.6 kg)   Height:        Intake/Output Summary (Last 24 hours) at 05/06/2017 0935 Last data filed at 05/06/2017 0600 Gross per 24 hour  Intake 600 ml  Output 1400 ml  Net -800 ml   Filed Weights   05/04/17 1358 05/05/17 0415 05/06/17 0512  Weight: 251 lb 5.2 oz (114 kg) 249 lb 4.8 oz (113.1 kg) 243 lb 14.4 oz (110.6 kg)    Telemetry    SR, ST w /frequent PVCs - Personally Reviewed  ECG    03/27, SR, RBBB - Personally Reviewed  Physical Exam   General: Well developed, well nourished, female appearing in no acute distress. Head: Normocephalic, atraumatic.  Neck: Supple without bruits, JVD not seen elevated but difficult to assess 2nd body habitus. Lungs:  Resp regular and unlabored, decreased BS bases w/  few rales. Heart: RRR, S1, S2, no S3, S4, or murmur; no rub. Abdomen: Soft, non-tender, non-distended with normoactive bowel sounds. No hepatomegaly. No rebound/guarding. No obvious abdominal masses. Extremities: No clubbing, cyanosis, trace edema. Distal pedal pulses are 2+ bilaterally. Neuro: Alert and oriented X 3. Moves all extremities spontaneously. Psych: Normal affect.  Labs    Hematology Recent Labs  Lab 05/02/17 1719 05/04/17 1408 05/06/17 0442  WBC 8.9 9.0 7.9  RBC 4.24 4.13 4.13  HGB 12.3 12.2 12.0  HCT 39.5 38.1 37.8  MCV 93.2 92.3 91.5  MCH 29.0 29.5 29.1  MCHC 31.1 32.0 31.7  RDW 14.5 14.1 14.2  PLT 273 250 232    Chemistry Recent Labs  Lab 05/04/17 1408 05/05/17 0555 05/06/17 0442  NA 137 140 140  K 4.0 4.0 3.8  CL 105 107 105  CO2 23 22 26   GLUCOSE 116* 106* 111*  BUN 16 13 15   CREATININE 0.84 0.88 1.08*  CALCIUM 8.9 8.6* 8.7*  PROT  --  6.3*  --   ALBUMIN  --  3.3*  --   AST  --  19  --   ALT  --  15  --   ALKPHOS  --  74  --   BILITOT  --  0.9  --   GFRNONAA >60 >60 50*  GFRAA >60 >60 58*  ANIONGAP 9 11 9      Cardiac Enzymes Recent Labs  Lab 05/04/17 1408 05/05/17 0031 05/05/17 0555  TROPONINI <0.03 <0.03 <0.03    Recent Labs  Lab 05/02/17 1736  TROPIPOC 0.02     BNP Recent Labs  Lab 05/02/17 1718 05/05/17 0031  BNP 125.9* 112.5*     Radiology    Dg Chest 2 View  Result Date: 05/04/2017 CLINICAL DATA:  Mid to left-sided chest pain with shortness of breath for 1 month. History of hypertension. EXAM: CHEST - 2 VIEW COMPARISON:  Radiographs 05/02/2017.  CT 11/05/2016. FINDINGS: The heart size and mediastinal contours are normal. The lungs are clear. There is no pleural effusion or pneumothorax. No acute osseous findings are identified. Postsurgical changes of the distal right clavicle. Mild thoracic spine degenerative changes. IMPRESSION: Stable chest.  No active cardiopulmonary process. Electronically Signed   By: Richardean Sale M.D.   On: 05/04/2017 14:25   Dg Chest 2 View  Result Date: 05/02/2017 CLINICAL DATA:  72 year old female with shortness of breath EXAM: CHEST - 2 VIEW COMPARISON:  Chest CT dated 11/05/2016 FINDINGS: The heart size and mediastinal contours are within normal limits. Both lungs are clear. The visualized skeletal structures are unremarkable. IMPRESSION: No active cardiopulmonary disease. Electronically Signed   By: Anner Crete M.D.   On: 05/02/2017 21:33   Ct Head Wo Contrast  Result Date: 05/04/2017 CLINICAL DATA:  Headache EXAM: CT HEAD WITHOUT CONTRAST TECHNIQUE: Contiguous axial images were obtained from the base of the skull through the vertex without intravenous contrast. COMPARISON:  07/25/2006 FINDINGS: Brain: No evidence of acute infarction, hemorrhage, hydrocephalus, extra-axial collection or mass lesion/mass effect. Vascular: No hyperdense vessel or unexpected calcification. Skull: Normal. Negative for fracture or focal lesion. Sinuses/Orbits: The visualized paranasal sinuses are essentially clear. The mastoid air cells are unopacified. Other: None. IMPRESSION: Normal head CT. Electronically Signed   By: Julian Hy M.D.   On: 05/04/2017 18:09     Cardiac Studies   ECHO:  ordered  Patient Profile     72 y.o. female w/ hx hypertension, anxiety, chronic diastolic heart failure, Barrett's esophagus and morbid obesity who was admitted 03/28 for acute on chronic diastolic HF and CP  Assessment & Plan    Principal Problem: 1.  Acute on chronic diastolic CHF (congestive heart failure) (HCC) - dry weight unclear but likely about 242 lbs. - admit wt 251>>249>>243, so is close - however, DOE is not improved, will ask staff to ck O2 sats w / ambulation - obesity and deconditioning may play a role in DOE as well. - however, to settle DOE and CP issues, may need R/L heart cath  Active Problems: 2.  Hypertension - per IM, good control on current rx  3.  BARRETTS  ESOPHAGUS - per IM  4.  Chest pain - atypical and ez neg MI - MD advise on further eval.  5.  Chronic diastolic CHF (congestive heart failure) ([place]) - see above  6.  Headache - per IM - CT ok  SignedRosaria Ferries , PA-C 9:35 AM 05/06/2017 Pager: [phone]  Personally seen and examined. Agree with above.  72 year old female with many months of dyspnea on exertion, mild tachycardia, PE negative CT in 9/18, morbid obesity.   Good UOP -1.4, weight 251 to 243. (Dry 242ish)  Exam: fairly comfortable in bed. Still with DOE. Mildly tachy. (when watching TV, anxious,  HR can increase to 130). Lungs fairly clear, abd obese, trace edema  A/P  Acute on chronic diastolic HF  - combination of deconditioning, morbid obesity. On IV lasix.   - negative PE 10/2016  - I think it makes sense to proceed with heart cath, right and left to get down to the bottom of any potential CAD (has LAD calcification). Are we near euvolemia? Creat slightly elevated.   - will start metoprolol 25 BID  Candee Furbish, MD

## 2017-05-06 NOTE — Progress Notes (Signed)
PT Cancellation Note  Patient Details Name: [name] MRN: [id number] DOB: [birth year]   Cancelled Treatment:    Reason Eval/Treat Not Completed: PT screened, no needs identified, will sign off.  Per RN, [name] is completely independent and not in need of an evaluation.  Will sign off at this time. 05/06/2017  Donnella Sham, PT [phone]  (pager)   [name] 05/06/2017, 4:25 PM

## 2017-05-07 ENCOUNTER — Other Ambulatory Visit: Payer: Self-pay | Admitting: Gastroenterology

## 2017-05-07 LAB — BASIC METABOLIC PANEL
Anion gap: 8 (ref 5–15)
BUN: 19 mg/dL (ref 6–20)
CO2: 26 mmol/L (ref 22–32)
Calcium: 8.4 mg/dL — ABNORMAL LOW (ref 8.9–10.3)
Chloride: 105 mmol/L (ref 101–111)
Creatinine, Ser: 1.07 mg/dL — ABNORMAL HIGH (ref 0.44–1.00)
GFR calc Af Amer: 59 mL/min — ABNORMAL LOW (ref >60)
GFR calc non Af Amer: 51 mL/min — ABNORMAL LOW (ref >60)
Glucose, Bld: 102 mg/dL — ABNORMAL HIGH (ref 65–99)
Potassium: 5 mmol/L (ref 3.5–5.1)
Sodium: 139 mmol/L (ref 135–145)

## 2017-05-07 LAB — MAGNESIUM: Magnesium: 1.9 mg/dL (ref 1.7–2.4)

## 2017-05-07 MED ORDER — FUROSEMIDE 10 MG/ML IJ SOLN
40.0000 mg | Freq: Two times a day (BID) | INTRAMUSCULAR | Status: DC
  Administered 2017-05-07 – 2017-05-08 (×3): 40 mg via INTRAVENOUS
  Filled 2017-05-07 (×2): qty 4

## 2017-05-07 MED ORDER — METOPROLOL SUCCINATE ER 50 MG PO TB24
50.0000 mg | ORAL_TABLET | Freq: Every day | ORAL | Status: DC
  Administered 2017-05-07 – 2017-05-08 (×2): 50 mg via ORAL
  Filled 2017-05-07 (×2): qty 1

## 2017-05-07 NOTE — Progress Notes (Signed)
Patient ID: [name], female   DOB: 08-20-1945, 72 y.o.   MRN: [id number]  PROGRESS NOTE    [name] [facility]  XTG:[id number] DOB: 07/20/1945 DOA: 05/04/2017 PCP: Colon Branch, MD   Brief Narrative:  72 year old female with history of hypertension, anxiety, chronic pain, Barrett's esophagus and chronic diastolic CHF presented with shortness of breath, weight gain and chest discomfort.  She was admitted with acute CHF exacerbation and started on intravenous Lasix.  Cardiology was consulted.  Patient underwent cardiac catheterization on 05/06/2017.   Assessment & Plan:   Principal Problem:   Acute on chronic diastolic CHF (congestive heart failure) (HCC) Active Problems:   Hypertension   BARRETTS ESOPHAGUS   Chest pain   Chronic diastolic CHF (congestive heart failure) ([place])   Headache   Unstable angina ([place])   1. Acute on chronic diastolic CHF  -Cardiology following.  Echo showed EF of 40-45%.  Patient underwent right and left heart cath on 05/06/2017 which showed no significant coronary artery disease but EF was 30-35% -Continue strict input and output.  Daily weights.  Negative balance of 1237 cc since admission -Continue IV Lasix.  Benazepril has been discontinued by cardiology with plans to start [facility] tomorrow.  Continue Toprol  2. Chest discomfort  - Probably secondary to above.  Improving with diuresis.  Troponins have been negative so far  3. Hypertension  - Pressure stable.  Monitor.  Continue Lasix and Toprol.  4. Barrett esophagus  - Pathology from 2017 consistent with esophagitis, negative for malignancy  - Not taking PPI or H2-blocker at home - Continue famotidine.  Outpatient follow-up with GI  5. Headache  - Pt has hx of migraine, had HA in ED, head CT normal, and HA has since resolved    6. Anxiety; insomnia  - Continue Klonopin and trazodone    DVT prophylaxis: Lovenox Code Status: Full  Family Communication: Discussed with patient.  No family at  bedside Disposition Plan: Home in 1-2 days once cleared by cardiology  Consultants: Cardiology  Procedures: Echo on 05/06/2017 Study Conclusions  - Procedure narrative: Transthoracic echocardiography. Technically   difficult study with reduced echo windows. Intravenous contrast   (Definity) was administered. - Left ventricle: The cavity size was normal. Wall thickness was   increased in a pattern of moderate LVH. Systolic function was   mildly to moderately reduced. The estimated ejection fraction was   in the range of 40% to 45%. Inferoseptal hypokinesis. Doppler   parameters are consistent with abnormal left ventricular   relaxation (grade 1 diastolic dysfunction). The E/e&' ratio is   between 8-15, suggesting indeterminate LV filling pressure. - Aortic valve: Sclerosis without stenosis. There was trivial   regurgitation. - Mitral valve: Mildly thickened leaflets . There was trivial   regurgitation. - Left atrium: The atrium was normal in size. - Inferior vena cava: The vessel was normal in size. The   respirophasic diameter changes were in the normal range (>= 50%),   consistent with normal central venous pressure.  Impressions:  - Technically difficult study. Definity contrast given. Compared to   a prior echo in 2011, LVEF is lower at 40-45% with predominant   inferior and inferoseptal hypokinesis.  Left and right heart cath on 05/06/2017 Conclusions: 1. No angiographically significant coronary artery disease. 2. Moderately to severely reduced left ventricular contraction (LVEF 30-35%), consistent with non-ischemic cardiomyopathy. 3. Upper normal to mildly elevated left heart, right heart, and pulmonary artery pressures. 4. Low normal to mildly decreased cardiac  output/index.  Recommendations: 1. Optimize evidence-based heart failure therapy. 2. Primary prevention of coronary artery disease.  Antimicrobials: None   Subjective: Patient seen and examined at  bedside.  No overnight fever, nausea, vomiting or worsening shortness of breath.  No chest pains.  Objective: Vitals:   05/06/17 1608 05/06/17 2105 05/07/17 0442 05/07/17 1009  BP: (!) 102/54 119/66 (!) 136/50 106/60  Pulse:  82 80 79  Resp:   12   Temp:  98 F (36.7 C) 97.9 F (36.6 C)   TempSrc:  Oral Oral   SpO2:   97%   Weight:   111.1 kg (244 lb 14.4 oz)   Height:        Intake/Output Summary (Last 24 hours) at 05/07/2017 1128 Last data filed at 05/07/2017 1033 Gross per 24 hour  Intake 723 ml  Output 500 ml  Net 223 ml   Filed Weights   05/05/17 0415 05/06/17 0512 05/07/17 0442  Weight: 113.1 kg (249 lb 4.8 oz) 110.6 kg (243 lb 14.4 oz) 111.1 kg (244 lb 14.4 oz)    Examination:  General exam: No distress.  Calm and comfortable.   Respiratory system: Bilateral decreased breath sounds at bases with some basilar crackles Cardiovascular system: Rate controlled, S1-S2 heard  gastrointestinal system: Abdomen is nondistended, soft and nontender. Normal bowel sounds heard. Extremities: No cyanosis; trace edema   Data Reviewed: I have personally reviewed following labs and imaging studies  CBC: Recent Labs  Lab 05/02/17 1719 05/04/17 1408 05/06/17 0442  WBC 8.9 9.0 7.9  NEUTROABS 5.2  --  4.5  HGB 12.3 12.2 12.0  HCT 39.5 38.1 37.8  MCV 93.2 92.3 91.5  PLT 273 250 546   Basic Metabolic Panel: Recent Labs  Lab 05/02/17 1719 05/04/17 1408 05/05/17 0555 05/06/17 0442 05/07/17 0432  NA 139 137 140 140 139  K 4.1 4.0 4.0 3.8 5.0  CL 106 105 107 105 105  CO2 22 23 22 26 26   GLUCOSE 96 116* 106* 111* 102*  BUN 14 16 13 15 19   CREATININE 0.87 0.84 0.88 1.08* 1.07*  CALCIUM 9.1 8.9 8.6* 8.7* 8.4*  MG  --   --   --  1.8 1.9   GFR: Estimated Creatinine Clearance: 60 mL/min (A) (by C-G formula based on SCr of 1.07 mg/dL (H)). Liver Function Tests: Recent Labs  Lab 05/05/17 0555  AST 19  ALT 15  ALKPHOS 74  BILITOT 0.9  PROT 6.3*  ALBUMIN 3.3*    Recent Labs  Lab 05/05/17 0555  LIPASE 20   No results for input(s): AMMONIA in the last 168 hours. Coagulation Profile: Recent Labs  Lab 05/06/17 1212  INR 1.06   Cardiac Enzymes: Recent Labs  Lab 05/04/17 1408 05/05/17 0031 05/05/17 0555  TROPONINI <0.03 <0.03 <0.03   BNP (last 3 results) No results for input(s): PROBNP in the last 8760 hours. HbA1C: No results for input(s): HGBA1C in the last 72 hours. CBG: No results for input(s): GLUCAP in the last 168 hours. Lipid Profile: No results for input(s): CHOL, HDL, LDLCALC, TRIG, CHOLHDL, LDLDIRECT in the last 72 hours. Thyroid Function Tests: No results for input(s): TSH, T4TOTAL, FREET4, T3FREE, THYROIDAB in the last 72 hours. Anemia Panel: No results for input(s): VITAMINB12, FOLATE, FERRITIN, TIBC, IRON, RETICCTPCT in the last 72 hours. Sepsis Labs: No results for input(s): PROCALCITON, LATICACIDVEN in the last 168 hours.  No results found for this or any previous visit (from the past 240 hour(s)).  Radiology Studies: No results found.      Scheduled Meds: . aspirin EC  81 mg Oral Daily  . enoxaparin (LOVENOX) injection  40 mg Subcutaneous Q24H  . famotidine  20 mg Oral BID  . furosemide  40 mg Intravenous BID  . metoprolol succinate  50 mg Oral Daily  . rOPINIRole  1 mg Oral BID  . sodium chloride flush  3 mL Intravenous Q12H  . sodium chloride flush  3 mL Intravenous Q12H  . traZODone  50-100 mg Oral QHS   Continuous Infusions: . sodium chloride    . sodium chloride       LOS: 3 days        Aline August, MD [facility] Pager [phone]  If 7PM-7AM, please contact night-coverage [email or web address] Password [facility] 05/07/2017, 11:28 AM

## 2017-05-07 NOTE — Progress Notes (Signed)
Progress Note  Patient Name: [name] Date of Encounter: 05/07/2017  Primary Cardiologist: Candee Furbish, MD   Subjective   A little better, still SOB  Inpatient Medications    Scheduled Meds: . aspirin EC  81 mg Oral Daily  . benazepril  20 mg Oral Daily  . enoxaparin (LOVENOX) injection  40 mg Subcutaneous Q24H  . famotidine  20 mg Oral BID  . furosemide  20 mg Oral Daily  . metoprolol tartrate  25 mg Oral BID  . rOPINIRole  1 mg Oral BID  . sodium chloride flush  3 mL Intravenous Q12H  . sodium chloride flush  3 mL Intravenous Q12H  . traZODone  50-100 mg Oral QHS   Continuous Infusions: . sodium chloride    . sodium chloride     PRN Meds: sodium chloride, sodium chloride, acetaminophen, clonazePAM, gi cocktail, HYDROcodone-acetaminophen, loperamide, nitroGLYCERIN, ondansetron (ZOFRAN) IV, sodium chloride flush, sodium chloride flush, tiZANidine   Vital Signs    Vitals:   05/06/17 1525 05/06/17 1608 05/06/17 2105 05/07/17 0442  BP: (!) 108/50 (!) 102/54 119/66 (!) 136/50  Pulse:   82 80  Resp:    12  Temp:   98 F (36.7 C) 97.9 F (36.6 C)  TempSrc:   Oral Oral  SpO2:    97%  Weight:    244 lb 14.4 oz (111.1 kg)  Height:        Intake/Output Summary (Last 24 hours) at 05/07/2017 0845 Last data filed at 05/06/2017 2217 Gross per 24 hour  Intake 720 ml  Output 500 ml  Net 220 ml   Filed Weights   05/05/17 0415 05/06/17 0512 05/07/17 0442  Weight: 249 lb 4.8 oz (113.1 kg) 243 lb 14.4 oz (110.6 kg) 244 lb 14.4 oz (111.1 kg)    Telemetry    Rare PVC's, NSR - Personally Reviewed  ECG    NSR - Personally Reviewed  Physical Exam  The GEN: No acute distress.  obese Neck: No JVD Cardiac: RRR, no murmurs, rubs, or gallops.  Respiratory: Clear to auscultation bilaterally. GI: Soft, nontender, non-distended  MS: No edema; No deformity. Cath site normal Neuro:  Nonfocal  Psych: Normal affect   Labs    Chemistry Recent Labs  Lab  05/05/17 0555 05/06/17 0442 05/07/17 0432  NA 140 140 139  K 4.0 3.8 5.0  CL 107 105 105  CO2 22 26 26   GLUCOSE 106* 111* 102*  BUN 13 15 19   CREATININE 0.88 1.08* 1.07*  CALCIUM 8.6* 8.7* 8.4*  PROT 6.3*  --   --   ALBUMIN 3.3*  --   --   AST 19  --   --   ALT 15  --   --   ALKPHOS 74  --   --   BILITOT 0.9  --   --   GFRNONAA >60 50* 51*  GFRAA >60 58* 59*  ANIONGAP 11 9 8      Hematology Recent Labs  Lab 05/02/17 1719 05/04/17 1408 05/06/17 0442  WBC 8.9 9.0 7.9  RBC 4.24 4.13 4.13  HGB 12.3 12.2 12.0  HCT 39.5 38.1 37.8  MCV 93.2 92.3 91.5  MCH 29.0 29.5 29.1  MCHC 31.1 32.0 31.7  RDW 14.5 14.1 14.2  PLT 273 250 232    Cardiac Enzymes Recent Labs  Lab 05/04/17 1408 05/05/17 0031 05/05/17 0555  TROPONINI <0.03 <0.03 <0.03    Recent Labs  Lab 05/02/17 1736  TROPIPOC 0.02  BNP Recent Labs  Lab 05/02/17 1718 05/05/17 0031  BNP 125.9* 112.5*     DDimer No results for input(s): DDIMER in the last 168 hours.   Radiology    No results found.  Cardiac Studies   Cath:  FINDINGS: 1. No significant CAD. 2. Upper normal/mildly elevated left and right heart filling pressures. 3. Low normal to mildly decreased cardiac output/index. 4. Severely reduced systolic function; LVEF ~84-12%.  RECOMMENDATIONS: 1. Medical therapy with optimization of evidence-based heart failure therapy.  Nelva Bush, MD  ECHO: - Technically difficult study. Definity contrast given. Compared to   a prior echo in 2011, LVEF is lower at 40-45% with predominant   inferior and inferoseptal hypokinesis.  Patient Profile     72 y.o. female with nonischemic cardiomyopathy  Assessment & Plan    Nonischemic cardiomyopathy -No CAD of significance seen on cardiac cath -Both left and right heart pressures were upper normal, close to euvolemic - Low normal cardiac output. -We will stop benazepril and in 36 hours start Entresto 24/26. Will start tomorrow  AM -Today I will give her another day of IV Lasix 40 mg twice daily to help optimize. Change metoprolol to Toprol 50 QD  - nutrition, exercise.   For questions or updates, please contact [place] Please consult [email or web address] for contact info under Cardiology/STEMI.      Signed, Candee Furbish, MD  05/07/2017, 8:45 AM

## 2017-05-08 DIAGNOSIS — I5021 Acute systolic (congestive) heart failure: Secondary | ICD-10-CM

## 2017-05-08 LAB — BASIC METABOLIC PANEL
Anion gap: 10 (ref 5–15)
BUN: 22 mg/dL — ABNORMAL HIGH (ref 6–20)
CO2: 25 mmol/L (ref 22–32)
Calcium: 8.4 mg/dL — ABNORMAL LOW (ref 8.9–10.3)
Chloride: 104 mmol/L (ref 101–111)
Creatinine, Ser: 1.2 mg/dL — ABNORMAL HIGH (ref 0.44–1.00)
GFR calc Af Amer: 51 mL/min — ABNORMAL LOW (ref >60)
GFR calc non Af Amer: 44 mL/min — ABNORMAL LOW (ref >60)
Glucose, Bld: 99 mg/dL (ref 65–99)
Potassium: 3.7 mmol/L (ref 3.5–5.1)
Sodium: 139 mmol/L (ref 135–145)

## 2017-05-08 LAB — MAGNESIUM: Magnesium: 1.8 mg/dL (ref 1.7–2.4)

## 2017-05-08 MED ORDER — FUROSEMIDE 20 MG PO TABS
20.0000 mg | ORAL_TABLET | Freq: Every day | ORAL | Status: DC
  Administered 2017-05-08: 20 mg via ORAL
  Filled 2017-05-08: qty 1

## 2017-05-08 MED ORDER — SACUBITRIL-VALSARTAN 24-26 MG PO TABS: 1.0000 | ORAL_TABLET | Freq: Two times a day (BID) | ORAL | 0 refills | Status: DC

## 2017-05-08 MED ORDER — NITROGLYCERIN 0.4 MG SL SUBL: 0.4000 mg | SUBLINGUAL_TABLET | SUBLINGUAL | 0 refills | Status: DC | PRN

## 2017-05-08 MED ORDER — METOPROLOL SUCCINATE ER 50 MG PO TB24: 50.0000 mg | ORAL_TABLET | Freq: Every day | ORAL | 0 refills | Status: DC

## 2017-05-08 MED ORDER — SACUBITRIL-VALSARTAN 24-26 MG PO TABS
1.0000 | ORAL_TABLET | Freq: Two times a day (BID) | ORAL | Status: DC
  Administered 2017-05-08: 1 via ORAL
  Filled 2017-05-08: qty 1

## 2017-05-08 MED ORDER — FUROSEMIDE 20 MG PO TABS: 20.0000 mg | ORAL_TABLET | Freq: Every day | ORAL | 0 refills | Status: DC

## 2017-05-08 MED ORDER — LIVING BETTER WITH HEART FAILURE BOOK: Freq: Once | Status: DC

## 2017-05-08 NOTE — Care Management Note (Signed)
Case Management Note  Patient Details  Name: [name] MRN: [id number] Date of Birth: 19-Jun-1945  Subjective/Objective: Pt presented for Acute on Chronic CHF- PTA from home with support of son. CM received referral for [facility] needs.                   Action/Plan: CM did discuss the option of HH Services. Pt states she ambulates well and she gets her medications appropriately- she has a pill box that she can use for her medications. Pt declined [place] [facility] at this time. CM did make her aware to contact PCP if she needs services in the future.   Expected Discharge Date:  05/08/17               Expected Discharge Plan:  Home/Self Care  In-House Referral:  NA  Discharge planning Services  CM Consult, Medication Assistance  Post Acute Care Choice:  NA Choice offered to:  NA  DME Arranged:  N/A DME Agency:  NA  HH Arranged:  NA HH Agency:  NA  Status of Service:  Completed, signed off  If discussed at [place] of Stay Meetings, dates discussed:    Discharge Disposition: home/self care   Additional Comments:  05/08/17- 1140- Josph Norfleet RN, CM- pt for d/c home today- referral received for Entrestro needs- unable to do benefits check on weekends- spoke with pt at bedside- she has already received msg from her [facility] pharmacy that drug will need approval by MD- will have MD provide paper script to use with 30 day coupon- 30 day free card provided to pt at bedside to use today- also encouraged pt to call [facility] to enroll in program and ask any questions about her benefits- she will have her pharmacy send auth info to Dr. Marlou Porch office to f/u on approval. She has f/u with Dr. Marlou Porch with Cards. Pt verbalized understanding of the above. Bedside RN (Jas) notified of need for paper script and will f/u to provide prior to discharge.   Dawayne Patricia, RN 05/08/2017, 11:42 AM Weekend Coverage [phone]

## 2017-05-08 NOTE — Discharge Summary (Signed)
Physician Discharge Summary  [facility] VZD:[id number] DOB: March 31, 1945 DOA: 05/04/2017  PCP: Colon Branch, MD  Admit date: 05/04/2017 Discharge date: 05/08/2017  Admitted From: Home Disposition: Home  Recommendations for Outpatient Follow-up:  1. Follow up with PCP in 1 week with repeat CBC/BMP 2. Follow-up with cardiology in 1-2 weeks   Home Health: No Equipment/Devices: None  Discharge Condition: Stable CODE STATUS: Full Diet recommendation: Heart Healthy /fluid restriction up to 1500 mL/day  Brief/Interim Summary:72 year old female with history of hypertension, anxiety, chronic pain, Barrett's esophagus and chronic diastolic CHF presented with shortness of breath, weight gain and chest discomfort.  She was admitted with acute CHF exacerbation and started on intravenous Lasix.  Cardiology was consulted.  Patient underwent cardiac catheterization on 05/06/2017 which showed no significant coronary artery disease but EF of 30-35%.  Patient diuresed well.  She was switched to oral Entresto today.  She has been cleared for discharge by cardiology.     Discharge Diagnoses:  Principal Problem:   Acute on chronic diastolic CHF (congestive heart failure) (HCC) Active Problems:   Hypertension   BARRETTS ESOPHAGUS   Chest pain   Chronic diastolic CHF (congestive heart failure) ([place])   Headache   Unstable angina ([place])    1.Acute on chronic diastolic CHF -Cardiology following.  Echo showed EF of 40-45%.  Patient underwent right and left heart cath on 05/06/2017 which showed no significant coronary artery disease but EF was 30-35% - Negative balance of 3957 cc since admission -Currently on intravenous Lasix.  Benazepril has been discontinued by cardiology.  [name] has been started today by cardiology.  Cardiology has cleared the patient for discharge on Entresto 24/26 twice daily along with Lasix 20 mg daily and Toprol 50 mg daily.  Outpatient follow-up with cardiology in 1-2 weeks.   Follow BMP as an outpatient closely.  2.Chest discomfort -Probably secondary to above.  Improving with diuresis.  Troponins have been negative so far.  Outpatient follow-up  3.Hypertension -Blood pressure stable.    Continue and hypertensives as above  4.Barrett esophagus -Pathology from 2017 consistent with esophagitis, negative for malignancy -Outpatient follow-up with GI.  Continue omeprazole twice a day for now and follow-up with Dr. Justin Mend as an outpatient.  5.Headache -Resolved.  6. Anxiety; insomnia  - Continue Klonopin and trazodone.  Outpatient follow-up      Discharge Instructions  Discharge Instructions    (HEART FAILURE PATIENTS) Call MD:  Anytime you have any of the following symptoms: 1) 3 pound weight gain in 24 hours or 5 pounds in 1 week 2) shortness of breath, with or without a dry hacking cough 3) swelling in the hands, feet or stomach 4) if you have to sleep on extra pillows at night in order to breathe.   Complete by:  As directed    Call MD for:  difficulty breathing, headache or visual disturbances   Complete by:  As directed    Call MD for:  extreme fatigue   Complete by:  As directed    Call MD for:  hives   Complete by:  As directed    Call MD for:  persistant dizziness or light-headedness   Complete by:  As directed    Call MD for:  persistant nausea and vomiting   Complete by:  As directed    Call MD for:  severe uncontrolled pain   Complete by:  As directed    Call MD for:  temperature >100.4   Complete by:  As directed  Diet - low sodium heart healthy   Complete by:  As directed    Increase activity slowly   Complete by:  As directed      Allergies as of 05/08/2017      Reactions   Dextromethorphan-guaifenesin Nausea And Vomiting   Morphine Nausea And Vomiting   Penicillins Rash   "> 30 years ago; best I can remember it was just a light rash on my arm"      Medication List    STOP taking these medications    benazepril 20 MG tablet Commonly known as:  LOTENSIN     TAKE these medications   acetaminophen 500 MG tablet Commonly known as:  TYLENOL Take 1,000 mg by mouth every 6 (six) hours as needed for mild pain or headache.   aspirin EC 81 MG tablet Take 81 mg by mouth daily.   clonazePAM 0.5 MG tablet Commonly known as:  KLONOPIN Take 1 tablet (0.5 mg total) by mouth 3 (three) times daily as needed for anxiety.   furosemide 20 MG tablet Commonly known as:  LASIX Take 1 tablet (20 mg total) by mouth daily.   HYDROcodone-acetaminophen 10-325 MG tablet Commonly known as:  NORCO Take 1 tablet by mouth every 4 (four) hours as needed for moderate pain or severe pain. For back pain   ICY HOT ADVANCED RELIEF 16-11 % Crea Generic drug:  Menthol-Camphor Apply 1 application topically as needed (SHOULDER PAIN).   metoprolol succinate 50 MG 24 hr tablet Commonly known as:  TOPROL-XL Take 1 tablet (50 mg total) by mouth daily. Take with or immediately following a meal.   nitroGLYCERIN 0.4 MG SL tablet Commonly known as:  NITROSTAT Place 1 tablet (0.4 mg total) under the tongue every 5 (five) minutes as needed for chest pain.   omeprazole 40 MG capsule Commonly known as:  PRILOSEC Take 40 mg by mouth 2 (two) times daily before a meal.   rOPINIRole 1 MG tablet Commonly known as:  REQUIP TAKE 1 TABLET (1 MG TOTAL) BY MOUTH 2 (TWO) TIMES DAILY.   sacubitril-valsartan 24-26 MG Commonly known as:  ENTRESTO Take 1 tablet by mouth 2 (two) times daily.   SYSTANE BALANCE OP Apply 1 drop to eye as needed (DRY EYES).   tiZANidine 4 MG tablet Commonly known as:  ZANAFLEX Take 4 mg by mouth 2 (two) times daily as needed for muscle spasms.   traZODone 50 MG tablet Commonly known as:  DESYREL Take 1-2 tablets (50-100 mg total) by mouth at bedtime.      Follow-up Information    Colon Branch, MD. Schedule an appointment as soon as possible for a visit in 1 week(s).   Specialty:  Internal  Medicine Why:  With repeat CBC/BMP Contact information: [address] [place] [address] [phone]        Jerline Pain, MD .   Specialty:  Cardiology Contact information: [address] [place] [address] [phone]          Allergies  Allergen Reactions  . Dextromethorphan-Guaifenesin Nausea And Vomiting  . Morphine Nausea And Vomiting  . Penicillins Rash    "> 30 years ago; best I can remember it was just a light rash on my arm"    Consultations:  Cardiology   Procedures/Studies: Dg Chest 2 View  Result Date: 05/04/2017 CLINICAL DATA:  Mid to left-sided chest pain with shortness of breath for 1 month. History of hypertension. EXAM: CHEST -  2 VIEW COMPARISON:  Radiographs 05/02/2017.  CT 11/05/2016. FINDINGS: The heart size and mediastinal contours are normal. The lungs are clear. There is no pleural effusion or pneumothorax. No acute osseous findings are identified. Postsurgical changes of the distal right clavicle. Mild thoracic spine degenerative changes. IMPRESSION: Stable chest.  No active cardiopulmonary process. Electronically Signed   By: Richardean Sale M.D.   On: 05/04/2017 14:25   Dg Chest 2 View  Result Date: 05/02/2017 CLINICAL DATA:  72 year old female with shortness of breath EXAM: CHEST - 2 VIEW COMPARISON:  Chest CT dated 11/05/2016 FINDINGS: The heart size and mediastinal contours are within normal limits. Both lungs are clear. The visualized skeletal structures are unremarkable. IMPRESSION: No active cardiopulmonary disease. Electronically Signed   By: Anner Crete M.D.   On: 05/02/2017 21:33   Ct Head Wo Contrast  Result Date: 05/04/2017 CLINICAL DATA:  Headache EXAM: CT HEAD WITHOUT CONTRAST TECHNIQUE: Contiguous axial images were obtained from the base of the skull through the vertex without intravenous contrast. COMPARISON:  07/25/2006 FINDINGS: Brain: No evidence of acute infarction, hemorrhage,  hydrocephalus, extra-axial collection or mass lesion/mass effect. Vascular: No hyperdense vessel or unexpected calcification. Skull: Normal. Negative for fracture or focal lesion. Sinuses/Orbits: The visualized paranasal sinuses are essentially clear. The mastoid air cells are unopacified. Other: None. IMPRESSION: Normal head CT. Electronically Signed   By: Julian Hy M.D.   On: 05/04/2017 18:09    Echo on 05/06/2017 Study Conclusions  - Procedure narrative: Transthoracic echocardiography. Technically difficult study with reduced echo windows. Intravenous contrast (Definity) was administered. - Left ventricle: The cavity size was normal. Wall thickness was increased in a pattern of moderate LVH. Systolic function was mildly to moderately reduced. The estimated ejection fraction was in the range of 40% to 45%. Inferoseptal hypokinesis. Doppler parameters are consistent with abnormal left ventricular relaxation (grade 1 diastolic dysfunction). The E/e&' ratio is between 8-15, suggesting indeterminate LV filling pressure. - Aortic valve: Sclerosis without stenosis. There was trivial regurgitation. - Mitral valve: Mildly thickened leaflets . There was trivial regurgitation. - Left atrium: The atrium was normal in size. - Inferior vena cava: The vessel was normal in size. The respirophasic diameter changes were in the normal range (>= 50%), consistent with normal central venous pressure.  Impressions:  - Technically difficult study. Definity contrast given. Compared to a prior echo in 2011, LVEF is lower at 40-45% with predominant inferior and inferoseptal hypokinesis.  Left and right heart cath on 05/06/2017 Conclusions: 1. No angiographically significant coronary artery disease. 2. Moderately to severely reduced left ventricular contraction (LVEF 30-35%), consistent with non-ischemic cardiomyopathy. 3. Upper normal to mildly elevated left heart,  right heart, and pulmonary artery pressures. 4. Low normal to mildly decreased cardiac output/index.  Recommendations: 1. Optimize evidence-based heart failure therapy. 2. Primary prevention of coronary artery disease.     Subjective: Patient seen and examined at bedside.  She denies any overnight fever, nausea, vomiting.  Her shortness of breath is improving.  No current chest pain.  Discharge Exam: Vitals:   05/07/17 1959 05/08/17 0437  BP: 98/66 117/65  Pulse: 72 62  Resp:    Temp: 97.7 F (36.5 C) 98 F (36.7 C)  SpO2: 100% 98%   Vitals:   05/07/17 1009 05/07/17 1352 05/07/17 1959 05/08/17 0437  BP: 106/60 (!) 114/46 98/66 117/65  Pulse: 79 80 72 62  Resp:      Temp:  98 F (36.7 C) 97.7 F (36.5 C) 98 F (36.7 C)  TempSrc:  Oral Oral Oral  SpO2:  100% 100% 98%  Weight:    110.6 kg (243 lb 12.8 oz)  Height:        General: Pt is alert, awake, not in acute distress Cardiovascular: Rate controlled, S1/S2 + Respiratory: Bilateral  decreased breath sounds at bases  abdominal: Soft, NT, ND, bowel sounds + Extremities: Trace edema, no cyanosis    The results of significant diagnostics from this hospitalization (including imaging, microbiology, ancillary and laboratory) are listed below for reference.     Microbiology: No results found for this or any previous visit (from the past 240 hour(s)).   Labs: BNP (last 3 results) Recent Labs    05/02/17 1718 05/05/17 0031  BNP 125.9* 993.7*   Basic Metabolic Panel: Recent Labs  Lab 05/04/17 1408 05/05/17 0555 05/06/17 0442 05/07/17 0432 05/08/17 0337  NA 137 140 140 139 139  K 4.0 4.0 3.8 5.0 3.7  CL 105 107 105 105 104  CO2 23 22 26 26 25   GLUCOSE 116* 106* 111* 102* 99  BUN 16 13 15 19  22*  CREATININE 0.84 0.88 1.08* 1.07* 1.20*  CALCIUM 8.9 8.6* 8.7* 8.4* 8.4*  MG  --   --  1.8 1.9 1.8   Liver Function Tests: Recent Labs  Lab 05/05/17 0555  AST 19  ALT 15  ALKPHOS 74  BILITOT 0.9  PROT  6.3*  ALBUMIN 3.3*   Recent Labs  Lab 05/05/17 0555  LIPASE 20   No results for input(s): AMMONIA in the last 168 hours. CBC: Recent Labs  Lab 05/02/17 1719 05/04/17 1408 05/06/17 0442  WBC 8.9 9.0 7.9  NEUTROABS 5.2  --  4.5  HGB 12.3 12.2 12.0  HCT 39.5 38.1 37.8  MCV 93.2 92.3 91.5  PLT 273 250 232   Cardiac Enzymes: Recent Labs  Lab 05/04/17 1408 05/05/17 0031 05/05/17 0555  TROPONINI <0.03 <0.03 <0.03   BNP: Invalid input(s): POCBNP CBG: No results for input(s): GLUCAP in the last 168 hours. D-Dimer No results for input(s): DDIMER in the last 72 hours. Hgb A1c No results for input(s): HGBA1C in the last 72 hours. Lipid Profile No results for input(s): CHOL, HDL, LDLCALC, TRIG, CHOLHDL, LDLDIRECT in the last 72 hours. Thyroid function studies No results for input(s): TSH, T4TOTAL, T3FREE, THYROIDAB in the last 72 hours.  Invalid input(s): FREET3 Anemia work up No results for input(s): VITAMINB12, FOLATE, FERRITIN, TIBC, IRON, RETICCTPCT in the last 72 hours. Urinalysis    Component Value Date/Time   COLORURINE YELLOW 10/23/2016 0626   APPEARANCEUR CLEAR 10/23/2016 0626   LABSPEC 1.040 (H) 10/23/2016 0626   PHURINE 5.0 10/23/2016 0626   GLUCOSEU NEGATIVE 10/23/2016 0626   GLUCOSEU NEGATIVE 07/12/2014 1310   HGBUR NEGATIVE 10/23/2016 0626   HGBUR negative 12/11/2007 1029   BILIRUBINUR NEGATIVE 10/23/2016 0626   BILIRUBINUR Negative 07/05/2014 1542   KETONESUR NEGATIVE 10/23/2016 0626   PROTEINUR NEGATIVE 10/23/2016 0626   UROBILINOGEN >=8.0 (A) 07/12/2014 1310   NITRITE NEGATIVE 10/23/2016 0626   LEUKOCYTESUR NEGATIVE 10/23/2016 0626   Sepsis Labs Invalid input(s): PROCALCITONIN,  WBC,  LACTICIDVEN Microbiology No results found for this or any previous visit (from the past 240 hour(s)).   Time coordinating discharge: 35 minutes  SIGNED:   Aline August, MD  [facility] 05/08/2017, 10:34 AM Pager: [phone]  If 7PM-7AM, please  contact night-coverage [email or web address] Password [email or web address]

## 2017-05-08 NOTE — Progress Notes (Signed)
Patient received discharge information and acknowledged understanding of it. Patient received prescription and entresto 30 day card. Patient IV was removed.

## 2017-05-08 NOTE — Progress Notes (Signed)
Progress Note  Patient Name: [name] Date of Encounter: 05/08/2017  Primary Cardiologist: Candee Furbish, MD   Subjective   Walking the hallways, feeling much better, much less short of breath.  No chest pain. Once again reminded me of her back pain.  Seeing a pain clinic.  Mention workers comp.  Inpatient Medications    Scheduled Meds: . aspirin EC  81 mg Oral Daily  . enoxaparin (LOVENOX) injection  40 mg Subcutaneous Q24H  . famotidine  20 mg Oral BID  . furosemide  40 mg Intravenous BID  . Living Better with Heart Failure Book   Does not apply Once  . metoprolol succinate  50 mg Oral Daily  . rOPINIRole  1 mg Oral BID  . sacubitril-valsartan  1 tablet Oral BID  . sodium chloride flush  3 mL Intravenous Q12H  . sodium chloride flush  3 mL Intravenous Q12H  . traZODone  50-100 mg Oral QHS   Continuous Infusions: . sodium chloride    . sodium chloride     PRN Meds: sodium chloride, sodium chloride, acetaminophen, clonazePAM, gi cocktail, HYDROcodone-acetaminophen, loperamide, nitroGLYCERIN, ondansetron (ZOFRAN) IV, sodium chloride flush, sodium chloride flush, tiZANidine   Vital Signs    Vitals:   05/07/17 1009 05/07/17 1352 05/07/17 1959 05/08/17 0437  BP: 106/60 (!) 114/46 98/66 117/65  Pulse: 79 80 72 62  Resp:      Temp:  98 F (36.7 C) 97.7 F (36.5 C) 98 F (36.7 C)  TempSrc:  Oral Oral Oral  SpO2:  100% 100% 98%  Weight:    243 lb 12.8 oz (110.6 kg)  Height:        Intake/Output Summary (Last 24 hours) at 05/08/2017 1001 Last data filed at 05/08/2017 0444 Gross per 24 hour  Intake 483 ml  Output 3200 ml  Net -2717 ml   Filed Weights   05/06/17 0512 05/07/17 0442 05/08/17 0437  Weight: 243 lb 14.4 oz (110.6 kg) 244 lb 14.4 oz (111.1 kg) 243 lb 12.8 oz (110.6 kg)    Telemetry    Rare PVC, NSR - Personally Reviewed  ECG    No new - Personally Reviewed  Physical Exam   GEN: No acute distress.   Neck: No JVD Cardiac: RRR, no murmurs,  rubs, or gallops.  Respiratory: Clear to auscultation bilaterally. GI: Soft, nontender, non-distended  MS: No edema; No deformity. Neuro:  Nonfocal  Psych: Normal affect   Labs    Chemistry Recent Labs  Lab 05/05/17 0555 05/06/17 0442 05/07/17 0432 05/08/17 0337  NA 140 140 139 139  K 4.0 3.8 5.0 3.7  CL 107 105 105 104  CO2 22 26 26 25   GLUCOSE 106* 111* 102* 99  BUN 13 15 19  22*  CREATININE 0.88 1.08* 1.07* 1.20*  CALCIUM 8.6* 8.7* 8.4* 8.4*  PROT 6.3*  --   --   --   ALBUMIN 3.3*  --   --   --   AST 19  --   --   --   ALT 15  --   --   --   ALKPHOS 74  --   --   --   BILITOT 0.9  --   --   --   GFRNONAA >60 50* 51* 44*  GFRAA >60 58* 59* 51*  ANIONGAP 11 9 8 10      Hematology Recent Labs  Lab 05/02/17 1719 05/04/17 1408 05/06/17 0442  WBC 8.9 9.0 7.9  RBC 4.24 4.13  4.13  HGB 12.3 12.2 12.0  HCT 39.5 38.1 37.8  MCV 93.2 92.3 91.5  MCH 29.0 29.5 29.1  MCHC 31.1 32.0 31.7  RDW 14.5 14.1 14.2  PLT 273 250 232    Cardiac Enzymes Recent Labs  Lab 05/04/17 1408 05/05/17 0031 05/05/17 0555  TROPONINI <0.03 <0.03 <0.03    Recent Labs  Lab 05/02/17 1736  TROPIPOC 0.02     BNP Recent Labs  Lab 05/02/17 1718 05/05/17 0031  BNP 125.9* 112.5*     DDimer No results for input(s): DDIMER in the last 168 hours.   Radiology    No results found.  Cardiac Studies    Cath:  FINDINGS: 1. No significant CAD. 2. Upper normal/mildly elevatedleft and right heart filling pressures. 3. Low normal to mildly decreased cardiac output/index. 4. Severely reduced systolic function; LVEF ~25-00%.  RECOMMENDATIONS: 1. Medical therapy with optimization of evidence-based heart failure therapy.  Nelva Bush, MD  ECHO: - Technically difficult study. Definity contrast given. Compared to a prior echo in 2011, LVEF is lower at 40-45% with predominant inferior and inferoseptal hypokinesis.     Patient Profile     72 y.o. female with  nonischemic cardiomyopathy  Assessment & Plan    Nonischemic cardiomyopathy  -I have started Entresto low-dose 24/26 twice a day.  She has been off of her ACE inhibitor for 36 hours.  I have reduced her Lasix dose to 20 mg p.o. once a day.  [name] has a diuretic effect.  We will have close follow-up with her in our clinic.  Continue with Toprol 50 mg once a day.  We will continue to try to titrate her medications as an outpatient.-I have consulted care management for Entresto. -No CAD of significance seen on cardiac catheterization but both her left and right heart pressures were upper normal.  Low normal cardiac output.  Morbid obesity -BMI greater than 35 with 2 or more comorbidities.  Continue to encourage weight loss.  She is very optimistic about this.  Essential hypertension -Blood pressure currently very well controlled.  We will be careful with Entresto to make sure she does not become too hypotensive.  Back pain -She sees a pain clinic.  Workers comp mentioned by her.  Try to avoid heavy NSAIDs.  We will see back in clinic in 1-2 weeks.  OK with DC.    For questions or updates, please contact [place] Please consult [email or web address] for contact info under Cardiology/STEMI.      Signed, Candee Furbish, MD  05/08/2017, 10:01 AM

## 2017-05-09 ENCOUNTER — Encounter (HOSPITAL_COMMUNITY): Payer: Self-pay | Admitting: Internal Medicine

## 2017-05-09 ENCOUNTER — Telehealth: Payer: Self-pay | Admitting: *Deleted

## 2017-05-09 MED FILL — Lidocaine HCl Local Inj 1%: INTRAMUSCULAR | Qty: 20 | Status: AC

## 2017-05-09 MED FILL — Heparin Sodium (Porcine) 2 Unit/ML in Sodium Chloride 0.9%: INTRAMUSCULAR | Qty: 1000 | Status: AC

## 2017-05-09 NOTE — Progress Notes (Signed)
Post discharge benefits check on Entrestro  Co-Pay for Entrestro 50% of the cost of the drug retail.([facility])  Mail order price $125.00.  Pre-auth required @ [phone].   Pt is already aware of required pre-approval and to f/u with MD and pharmacy

## 2017-05-09 NOTE — Telephone Encounter (Signed)
Jamisen Hawes [place] ZOX:[id number] DOB: August 21, 1945 DOA: 05/04/2017  PCP: Colon Branch, MD  Admit date: 05/04/2017 Discharge date: 05/08/2017  Admitted From: Home Disposition: Home  Recommendations for Outpatient Follow-up:  1. Follow up with PCP in 1 week with repeat CBC/BMP 2. Follow-up with cardiology in 1-2 weeks   Home Health: No Equipment/Devices: None  Discharge Condition: Stable CODE STATUS: Full Diet recommendation: Heart Healthy /fluid restriction up to 1500 mL/day  Transition Care Management Follow-up Telephone Call    How have you been since you were released from the hospital? Doing okay   Do you understand why you were in the hospital? yes   Do you understand the discharge instructions? yes   Where were you discharged to? Home with neighbor   Items Reviewed:  Medications reviewed: pt states she will bring her med list tomorrow.  Allergies reviewed: yes  Dietary changes reviewed: yes  Referrals reviewed: yes   Functional Questionnaire:   Activities of Daily Living (ADLs):   She states they are independent in the following: ambulation, bathing and hygiene, feeding, continence, grooming, toileting and dressing States they require assistance with the following: na   Any transportation issues/concerns?: no   Any patient concerns? no   Confirmed importance and date/time of follow-up visits scheduled yes  Provider Appointment booked with Dr.Paz 05/10/17 @1120   Confirmed with patient if condition begins to worsen call PCP or go to the ER.  Patient was given the office number and encouraged to call back with question or concerns.  : yes

## 2017-05-10 ENCOUNTER — Encounter: Payer: Self-pay | Admitting: Internal Medicine

## 2017-05-10 ENCOUNTER — Ambulatory Visit (INDEPENDENT_AMBULATORY_CARE_PROVIDER_SITE_OTHER): Payer: Medicare Other | Admitting: Internal Medicine

## 2017-05-10 ENCOUNTER — Telehealth: Payer: Self-pay | Admitting: Cardiology

## 2017-05-10 VITALS — BP 132/78 | HR 78 | Temp 97.6°F | Resp 14 | Ht 66.0 in | Wt 243.1 lb

## 2017-05-10 DIAGNOSIS — I5032 Chronic diastolic (congestive) heart failure: Secondary | ICD-10-CM | POA: Diagnosis not present

## 2017-05-10 LAB — CBC WITH DIFFERENTIAL/PLATELET
Basophils Absolute: 0 10*3/uL (ref 0.0–0.1)
Basophils Relative: 0.3 % (ref 0.0–3.0)
Eosinophils Absolute: 0.1 10*3/uL (ref 0.0–0.7)
Eosinophils Relative: 1.2 % (ref 0.0–5.0)
HCT: 39.4 % (ref 36.0–46.0)
Hemoglobin: 13 g/dL (ref 12.0–15.0)
Lymphocytes Relative: 31.2 % (ref 12.0–46.0)
Lymphs Abs: 2.4 10*3/uL (ref 0.7–4.0)
MCHC: 33 g/dL (ref 30.0–36.0)
MCV: 89.5 fl (ref 78.0–100.0)
Monocytes Absolute: 0.6 10*3/uL (ref 0.1–1.0)
Monocytes Relative: 7.2 % (ref 3.0–12.0)
Neutro Abs: 4.7 10*3/uL (ref 1.4–7.7)
Neutrophils Relative %: 60.1 % (ref 43.0–77.0)
Platelets: 258 10*3/uL (ref 150.0–400.0)
RBC: 4.4 Mil/uL (ref 3.87–5.11)
RDW: 14.4 % (ref 11.5–15.5)
WBC: 7.8 10*3/uL (ref 4.0–10.5)

## 2017-05-10 LAB — COMPREHENSIVE METABOLIC PANEL
ALT: 18 U/L (ref 0–35)
AST: 21 U/L (ref 0–37)
Albumin: 4 g/dL (ref 3.5–5.2)
Alkaline Phosphatase: 86 U/L (ref 39–117)
BUN: 26 mg/dL — ABNORMAL HIGH (ref 6–23)
CO2: 29 mEq/L (ref 19–32)
Calcium: 9.2 mg/dL (ref 8.4–10.5)
Chloride: 102 mEq/L (ref 96–112)
Creatinine, Ser: 1.08 mg/dL (ref 0.40–1.20)
GFR: 52.91 mL/min — ABNORMAL LOW (ref >60.00)
Glucose, Bld: 113 mg/dL — ABNORMAL HIGH (ref 70–99)
Potassium: 4.5 mEq/L (ref 3.5–5.1)
Sodium: 138 mEq/L (ref 135–145)
Total Bilirubin: 0.6 mg/dL (ref 0.2–1.2)
Total Protein: 7.6 g/dL (ref 6.0–8.3)

## 2017-05-10 NOTE — Progress Notes (Signed)
Subjective:    Patient ID: [name], female    DOB: Jul 01, 1945, 72 y.o.   MRN: [id number]  DOS:  05/10/2017 Type of visit - description : TCM 7 Interval history: Patient was admitted to the hospital and discharge 05/09/2007. Admitted with difficulty breathing, chest pain and weight gain. She was seen by cardiology, echo show EF of 40%. Cardiac catheterization showed no CAD but the EF was 30-35% She was diuresed about 3900 cc. Troponins were negative.  Wt Readings from Last 3 Encounters:  05/10/17 243 lb 2 oz (110.3 kg)  05/08/17 243 lb 12.8 oz (110.6 kg)  05/04/17 252 lb 2 oz (114.4 kg)    Review of Systems She left the hospital feels better.  Taking her medications without apparent side effects She weight herself daily, lost 1 pound when she weighed herself today. DOE improved. Edema definitely better. Has very mild chest pain as before, at this point is infrequent and again very mild. Needs help with her diet.   Past Medical History:  Diagnosis Date  . Anemia   . Anxiety   . Barrett's esophagus   . Bilateral carpal tunnel syndrome    Dr. Eddie Dibbles, having injection therapy  . C. difficile diarrhea 08/01/2012   severe 2014  . Chronic cystitis    Dr. Matilde Sprang  . Chronic lower back pain   . Depression   . DJD (degenerative joint disease)    bilateral hands; knees  . Family history of anesthesia complication    Mother had severe N/V  . GERD (gastroesophageal reflux disease)   . H/O cardiac catheterization    (-) cath 12-2002  , cath again 2011 (-)  . HTN (hypertension)   . Hyperlipidemia   . Insomnia   . Migraine   . Osteoporosis    pt unsure of this  . RLS (restless legs syndrome)   . Vitamin B 12 deficiency 04/09/2013    Past Surgical History:  Procedure Laterality Date  . Arm surgery Left    "don't remember what they did; arm wasn't broken"  . BILATERAL KNEE ARTHROSCOPY Bilateral   . CARDIAC CATHETERIZATION  01/22/10   clean cath  . CATARACT EXTRACTION  W/ INTRAOCULAR LENS  IMPLANT, BILATERAL Bilateral   . CHOLECYSTECTOMY N/A 10/25/2016   Procedure: LAPAROSCOPIC CHOLECYSTECTOMY;  Surgeon: Georganna Skeans, MD;  Location: [facility];  Service: General;  Laterality: N/A;  . COLONOSCOPY W/ POLYPECTOMY    . FOOT SURGERY Bilateral    toenails removed; callus removed on right; hammertoes right"  . Knot     "removed from right neck; not a goiter"  . LUMBAR DISC SURGERY     L5 S1 anterior fusion  . OOPHORECTOMY    . RIGHT/LEFT HEART CATH AND CORONARY ANGIOGRAPHY N/A 05/06/2017   Procedure: RIGHT/LEFT HEART CATH AND CORONARY ANGIOGRAPHY;  Surgeon: Nelva Bush, MD;  Location: [facility];  Service: Cardiovascular;  Laterality: N/A;  . SHOULDER ARTHROSCOPY Right   . TOTAL KNEE ARTHROPLASTY  10/05/2011   Procedure: TOTAL KNEE ARTHROPLASTY;  Surgeon: Hessie Dibble, MD;  Location: [facility];  Service: Orthopedics;  Laterality: Right;  [name] VAGINAL HYSTERECTOMY     for endometriosis    Social History   Socioeconomic History  . Marital status: Divorced    Spouse name: Not on file  . Number of children: 1  . Years of education: Not on file  . Highest education level: Not on file  Occupational History  . Occupation: Retired, Marine scientist  Employer: RETIRED  Social Needs  . Financial resource strain: Not on file  . Food insecurity:    Worry: Not on file    Inability: Not on file  . Transportation needs:    Medical: Not on file    Non-medical: Not on file  Tobacco Use  . Smoking status: Former Smoker    Packs/day: 0.50    Years: 10.00    Pack years: 5.00    Last attempt to quit: 02/09/1975    Years since quitting: 42.2  . Smokeless tobacco: Never Used  . Tobacco comment: started at age 33.   quit in the 55s.  Substance and Sexual Activity  . Alcohol use: Yes    Alcohol/week: 0.0 oz    Comment: occ  . Drug use: No    Comment: CBD and hemp OIL   . Sexual activity: Not on file  Lifestyle  . Physical activity:    Days per week: Not  on file    Minutes per session: Not on file  . Stress: Not on file  Relationships  . Social connections:    Talks on phone: Not on file    Gets together: Not on file    Attends religious service: Not on file    Active member of club or organization: Not on file    Attends meetings of clubs or organizations: Not on file    Relationship status: Not on file  . Intimate partner violence:    Fear of current or ex partner: Not on file    Emotionally abused: Not on file    Physically abused: Not on file    Forced sexual activity: Not on file  Other Topics Concern  . Not on file  Social History Narrative   Son lives w/ her       Allergies as of 05/10/2017      Reactions   Dextromethorphan-guaifenesin Nausea And Vomiting   Morphine Nausea And Vomiting   Penicillins Rash   "> 30 years ago; best I can remember it was just a light rash on my arm"      Medication List        Accurate as of 05/10/17 11:59 PM. Always use your most recent med list.          acetaminophen 500 MG tablet Commonly known as:  TYLENOL Take 1,000 mg by mouth every 6 (six) hours as needed for mild pain or headache.   aspirin EC 81 MG tablet Take 81 mg by mouth daily.   clonazePAM 0.5 MG tablet Commonly known as:  KLONOPIN Take 1 tablet (0.5 mg total) by mouth 3 (three) times daily as needed for anxiety.   furosemide 20 MG tablet Commonly known as:  LASIX Take 1 tablet (20 mg total) by mouth daily.   HYDROcodone-acetaminophen 10-325 MG tablet Commonly known as:  NORCO Take 1 tablet by mouth every 4 (four) hours as needed for moderate pain or severe pain. For back pain   ICY HOT ADVANCED RELIEF 16-11 % Crea Generic drug:  Menthol-Camphor Apply 1 application topically as needed (SHOULDER PAIN).   metoprolol succinate 50 MG 24 hr tablet Commonly known as:  TOPROL-XL Take 1 tablet (50 mg total) by mouth daily. Take with or immediately following a meal.   nitroGLYCERIN 0.4 MG SL tablet Commonly known  as:  NITROSTAT Place 1 tablet (0.4 mg total) under the tongue every 5 (five) minutes as needed for chest pain.   omeprazole 40 MG capsule Commonly known  as:  PRILOSEC TAKE 1 CAPSULE (40 MG TOTAL) BY MOUTH 2 (TWO) TIMES DAILY.   rOPINIRole 1 MG tablet Commonly known as:  REQUIP TAKE 1 TABLET (1 MG TOTAL) BY MOUTH 2 (TWO) TIMES DAILY.   sacubitril-valsartan 24-26 MG Commonly known as:  ENTRESTO Take 1 tablet by mouth 2 (two) times daily.   SYSTANE BALANCE OP Apply 1 drop to eye as needed (DRY EYES).   tiZANidine 4 MG tablet Commonly known as:  ZANAFLEX Take 4 mg by mouth 2 (two) times daily as needed for muscle spasms.   traZODone 50 MG tablet Commonly known as:  DESYREL Take 1-2 tablets (50-100 mg total) by mouth at bedtime.          Objective:   Physical Exam BP 132/78 (BP Location: Right Arm, Patient Position: Sitting, Cuff Size: Normal)   Pulse 78   Temp 97.6 F (36.4 C) (Oral)   Resp 14   Ht 5\' 6"  (1.676 m)   Wt 243 lb 2 oz (110.3 kg)   SpO2 98%   BMI 39.24 kg/m  General:   Well developed, well nourished . NAD.  HEENT:  Normocephalic . Face symmetric, atraumatic Neck: No JVD at 45 degrees Lungs:  CTA B Normal respiratory effort, no intercostal retractions, no accessory muscle use. Heart: RRR,  no murmur.  trace pretibial edema bilaterally  Abdomen:  Not distended, soft, non-tender. No rebound or rigidity.   Skin: Not pale. Not jaundice Neurologic:  alert & oriented X3.  Speech normal, gait appropriate for age and unassisted Psych--  Cognition and judgment appear intact.  Cooperative with normal attention span and concentration.  Behavior appropriate. No anxious or depressed appearing.     Assessment & Plan:  Assessment HTN Hyperlipidemia- Pravachol, Crestor: Myalgias. Intolerant to Zetia (joint aches), see OV  06/2016  Depression, Anxiety (on clonazepam), insomnia (on trazodone): depression x many  years, remotely on zoloft and other meds per  psych, I rx lexapro 2015, then switch to effexor ; Morbid obesity CV: CHF/ non ischemic cardiomyopathy dx 04-2017; cath normal coronaries, EF ~ 30% GI:  --GERD, Barrett's esophagus, EGD 05-2015 --h/o persistent C. difficile diarrhea 2014 B12 deficiency Osteopenia: Dexa 2015 showed osteopenia, T score -1.9 (10/2016): Rx cs, vit D RLS - Requip, see Pulmonary Migraines, f/u [facility] -- off topamax as off 06-2016 MSK: --Back pain, chronic: pain meds rx by ortho, Dr Eddie Dibbles retired, rx per Workers comp MD --DJD --CTS B OAB, LUTS-- on myrbetriq Dr McDarmoth  H/o Cardiac that confirmed the EF of 2011 and 2014 negative  PLAN CHF: Recently admitted to the hospital with CP/SOB. Was evaluated by cardiology, had a echo, also a cardiac catheterization with normal coronaries but confirmed the EF of 30%. New dx of cardiomyopathy.  After IV diuresis she was switched to oral Lasix and added Entresto. She is doing clinically much improved, waiting herself daily, good compliance with medications, she was already contacted by cardiology for a follow-up on plans to call them back. She has a difficult time managing fluid restriction and low-salt diet.  Will refer her to a nutritionist Check a CMP, CBC. RTC 3 months

## 2017-05-10 NOTE — Patient Instructions (Signed)
GO TO THE LAB : Get the blood work     GO TO THE FRONT DESK Schedule your next appointment for a checkup in 3 months   GENERAL INFORMATION ABOUT HEALTHY EATING, CHOLESTEROL, HIGH BLOOD PRESSURE The [facility] website The American Heart Association, www.heart.org

## 2017-05-10 NOTE — Progress Notes (Signed)
Pre visit review using our clinic review tool, if applicable. No additional management support is needed unless otherwise documented below in the visit note. 

## 2017-05-10 NOTE — Telephone Encounter (Signed)
Patient contacted regarding discharge from [facility] on May 08, 2017.  Patient understands to follow up with provider Ermalinda Barrios, PA-C on May 18, 2017 at 2pm at [facility]. Patient understands discharge instructions? yes Patient understands medications and regiment? yes Patient understands to bring all medications to this visit? yes  Pt couldn't come to appt on 4/9 with Truitt Merle, NP d/t an appt at [facility] in [facility]  Rescheduled pt to 4/10.

## 2017-05-10 NOTE — Telephone Encounter (Signed)
New message   [facility] 05/17/17 [address]

## 2017-05-11 NOTE — Assessment & Plan Note (Signed)
CHF: Recently admitted to the hospital with CP/SOB. Was evaluated by cardiology, had a echo, also a cardiac catheterization with normal coronaries but confirmed the EF of 30%. New dx of cardiomyopathy.  After IV diuresis she was switched to oral Lasix and added Entresto. She is doing clinically much improved, waiting herself daily, good compliance with medications, she was already contacted by cardiology for a follow-up on plans to call them back. She has a difficult time managing fluid restriction and low-salt diet.  Will refer her to a nutritionist Check a CMP, CBC. RTC 3 months

## 2017-05-13 ENCOUNTER — Ambulatory Visit (INDEPENDENT_AMBULATORY_CARE_PROVIDER_SITE_OTHER): Payer: Medicare Other | Admitting: Psychology

## 2017-05-13 ENCOUNTER — Other Ambulatory Visit: Payer: Self-pay | Admitting: Gastroenterology

## 2017-05-13 ENCOUNTER — Ambulatory Visit (INDEPENDENT_AMBULATORY_CARE_PROVIDER_SITE_OTHER): Payer: Medicare Other | Admitting: Emergency Medicine

## 2017-05-13 DIAGNOSIS — F332 Major depressive disorder, recurrent severe without psychotic features: Secondary | ICD-10-CM | POA: Diagnosis not present

## 2017-05-13 DIAGNOSIS — E538 Deficiency of other specified B group vitamins: Secondary | ICD-10-CM

## 2017-05-13 MED ORDER — CYANOCOBALAMIN 1000 MCG/ML IJ SOLN
1000.0000 ug | Freq: Once | INTRAMUSCULAR | Status: AC
  Administered 2017-05-13: 1000 ug via INTRAMUSCULAR

## 2017-05-13 NOTE — Progress Notes (Addendum)
Pre visit review using our clinic review tool, if applicable. No additional management support is needed unless otherwise documented below in the visit note.  Pt came in clinic for monthly B12 injection. IM injection given in left deltoid. Patient tolerated injection well. Next appointment scheduled for 06/10/17 at 2:30 pm.   Kathlene November, MD

## 2017-05-17 ENCOUNTER — Ambulatory Visit: Payer: Self-pay | Admitting: Nurse Practitioner

## 2017-05-18 ENCOUNTER — Ambulatory Visit (INDEPENDENT_AMBULATORY_CARE_PROVIDER_SITE_OTHER): Payer: Medicare Other | Admitting: Physician Assistant

## 2017-05-18 VITALS — BP 124/84 | HR 57 | Ht 66.0 in | Wt 242.2 lb

## 2017-05-18 DIAGNOSIS — I10 Essential (primary) hypertension: Secondary | ICD-10-CM

## 2017-05-18 DIAGNOSIS — I428 Other cardiomyopathies: Secondary | ICD-10-CM | POA: Diagnosis not present

## 2017-05-18 DIAGNOSIS — I5042 Chronic combined systolic (congestive) and diastolic (congestive) heart failure: Secondary | ICD-10-CM | POA: Diagnosis not present

## 2017-05-18 MED ORDER — SACUBITRIL-VALSARTAN 49-51 MG PO TABS: 1.0000 | ORAL_TABLET | Freq: Two times a day (BID) | ORAL | 11 refills | Status: DC

## 2017-05-18 NOTE — Progress Notes (Signed)
Cardiology Office Note    Date:  05/18/2017   ID:  [name], DOB [birth year], MRN [id number]  PCP:  Colon Branch, MD  Cardiologist: Candee Furbish, MD  Chief Complaint  Patient presents with  . Hospitalization Follow-up    History of Present Illness:  [name] is a 72 y.o. female with a history of hypertension, chronic diastolic CHF, anxiety, Barrett's esophagus who presented to the hospital with worsening shortness of breath weight gain and chest discomfort.  She was found to have a nonischemic cardiomyopathy LVEF 40-45% with inferior and inferior septal hypokinesis on 2D echo.  Cardiac cath 05/06/17 showed no significant CAD upper normal elevated left and right heart filling pressures and severely reduced LV function EF 30-35%.  Patient was started on Entresto 24/26 mg twice daily.  Lasix reduced to 20 mg daily.  Also on Toprol.  Patient comes in today for follow-up.  She has a lot of confusion over diet and 2 g sodium.  She wants to lose weight.  She has dyspnea on exertion with cleaning her house but overall is doing well without excessive edema    Past Medical History:  Diagnosis Date  . Anemia   . Anxiety   . Barrett's esophagus   . Bilateral carpal tunnel syndrome    Dr. Eddie Dibbles, having injection therapy  . C. difficile diarrhea 08/01/2012   severe 2014  . Chronic cystitis    Dr. Matilde Sprang  . Chronic lower back pain   . Depression   . DJD (degenerative joint disease)    bilateral hands; knees  . Family history of anesthesia complication    Mother had severe N/V  . GERD (gastroesophageal reflux disease)   . H/O cardiac catheterization    (-) cath 12-2002  , cath again 2011 (-)  . HTN (hypertension)   . Hyperlipidemia   . Insomnia   . Migraine   . Osteoporosis    pt unsure of this  . RLS (restless legs syndrome)   . Vitamin B 12 deficiency 04/09/2013    Past Surgical History:  Procedure Laterality Date  . Arm surgery Left    "don't remember what they did;  arm wasn't broken"  . BILATERAL KNEE ARTHROSCOPY Bilateral   . CARDIAC CATHETERIZATION  01/22/10   clean cath  . CATARACT EXTRACTION W/ INTRAOCULAR LENS  IMPLANT, BILATERAL Bilateral   . CHOLECYSTECTOMY N/A 10/25/2016   Procedure: LAPAROSCOPIC CHOLECYSTECTOMY;  Surgeon: Georganna Skeans, MD;  Location: [facility];  Service: General;  Laterality: N/A;  . COLONOSCOPY W/ POLYPECTOMY    . FOOT SURGERY Bilateral    toenails removed; callus removed on right; hammertoes right"  . Knot     "removed from right neck; not a goiter"  . LUMBAR DISC SURGERY     L5 S1 anterior fusion  . OOPHORECTOMY    . RIGHT/LEFT HEART CATH AND CORONARY ANGIOGRAPHY N/A 05/06/2017   Procedure: RIGHT/LEFT HEART CATH AND CORONARY ANGIOGRAPHY;  Surgeon: Nelva Bush, MD;  Location: [facility];  Service: Cardiovascular;  Laterality: N/A;  . SHOULDER ARTHROSCOPY Right   . TOTAL KNEE ARTHROPLASTY  10/05/2011   Procedure: TOTAL KNEE ARTHROPLASTY;  Surgeon: Hessie Dibble, MD;  Location: [facility];  Service: Orthopedics;  Laterality: Right;  [name] VAGINAL HYSTERECTOMY     for endometriosis    Current Medications: Current Meds  Medication Sig  . acetaminophen (TYLENOL) 500 MG tablet Take 1,000 mg by mouth every 6 (six) hours as needed for mild pain  or headache.  [name] aspirin EC 81 MG tablet Take 81 mg by mouth daily.  . clonazePAM (KLONOPIN) 0.5 MG tablet Take 1 tablet (0.5 mg total) by mouth 3 (three) times daily as needed for anxiety.  . furosemide (LASIX) 20 MG tablet Take 1 tablet (20 mg total) by mouth daily.  [name] HYDROcodone-acetaminophen (NORCO) 10-325 MG tablet Take 1 tablet by mouth every 4 (four) hours as needed for moderate pain or severe pain. For back pain  . Menthol-Camphor (ICY HOT ADVANCED RELIEF) 16-11 % CREA Apply 1 application topically as needed (SHOULDER PAIN).  [name] metoprolol succinate (TOPROL-XL) 50 MG 24 hr tablet Take 1 tablet (50 mg total) by mouth daily. Take with or immediately following a meal.  .  nitroGLYCERIN (NITROSTAT) 0.4 MG SL tablet Place 1 tablet (0.4 mg total) under the tongue every 5 (five) minutes as needed for chest pain.  [name] omeprazole (PRILOSEC) 40 MG capsule TAKE 1 CAPSULE (40 MG TOTAL) BY MOUTH 2 (TWO) TIMES DAILY.  [name] omeprazole (PRILOSEC) 40 MG capsule TAKE 1 CAPSULE (40 MG TOTAL) BY MOUTH 2 (TWO) TIMES DAILY.  [name] Propylene Glycol (SYSTANE BALANCE OP) Apply 1 drop to eye as needed (DRY EYES).  [name] rOPINIRole (REQUIP) 1 MG tablet TAKE 1 TABLET (1 MG TOTAL) BY MOUTH 2 (TWO) TIMES DAILY.  [name] tiZANidine (ZANAFLEX) 4 MG tablet Take 4 mg by mouth 2 (two) times daily as needed for muscle spasms.  . traZODone (DESYREL) 50 MG tablet Take 1-2 tablets (50-100 mg total) by mouth at bedtime.  . [DISCONTINUED] sacubitril-valsartan (ENTRESTO) 24-26 MG Take 1 tablet by mouth 2 (two) times daily.     Allergies:   Dextromethorphan-guaifenesin; Morphine; and Penicillins   Social History   Socioeconomic History  . Marital status: Divorced    Spouse name: Not on file  . Number of children: 1  . Years of education: Not on file  . Highest education level: Not on file  Occupational History  . Occupation: Retired, post Ecologist: RETIRED  Social Needs  . Financial resource strain: Not on file  . Food insecurity:    Worry: Not on file    Inability: Not on file  . Transportation needs:    Medical: Not on file    Non-medical: Not on file  Tobacco Use  . Smoking status: Former Smoker    Packs/day: 0.50    Years: 10.00    Pack years: 5.00    Last attempt to quit: 02/09/1975    Years since quitting: 42.2  . Smokeless tobacco: Never Used  . Tobacco comment: started at age 26.   quit in the 34s.  Substance and Sexual Activity  . Alcohol use: Yes    Alcohol/week: 0.0 oz    Comment: occ  . Drug use: No    Comment: CBD and hemp OIL   . Sexual activity: Not on file  Lifestyle  . Physical activity:    Days per week: Not on file    Minutes per session: Not on file  . Stress:  Not on file  Relationships  . Social connections:    Talks on phone: Not on file    Gets together: Not on file    Attends religious service: Not on file    Active member of club or organization: Not on file    Attends meetings of clubs or organizations: Not on file    Relationship status: Not on file  Other Topics Concern  . Not on  file  Social History Narrative   Son lives w/ her      Family History:  The patient's family history includes Breast cancer in her sister; CAD in her father; Colon cancer in her maternal grandfather and maternal uncle; Dementia in her father; Diabetes in her other; Esophageal cancer in her brother; Lung cancer in her mother; Stroke in her father.   ROS:   Please see the history of present illness.    Review of Systems  Constitution: Positive for malaise/fatigue and weight gain.  HENT: Negative.   Eyes: Negative.   Cardiovascular: Positive for dyspnea on exertion and leg swelling.  Respiratory: Negative.   Hematologic/Lymphatic: Negative.   Musculoskeletal: Positive for back pain. Negative for joint pain.  Gastrointestinal: Positive for abdominal pain and diarrhea.  Genitourinary: Negative.   Neurological: Positive for headaches.  Psychiatric/Behavioral: Positive for depression.   All other systems reviewed and are negative.   PHYSICAL EXAM:   VS:  BP 124/84   Pulse (!) 57   Ht '5\' 6"'  (1.676 m)   Wt 242 lb 3.2 oz (109.9 kg)   BMI 39.09 kg/m   Physical Exam  GEN: Obese, in no acute distress  Neck: no JVD, carotid bruits, or masses Cardiac:RRR; no murmurs, rubs, or gallops  Respiratory:  clear to auscultation bilaterally, normal work of breathing GI: soft, nontender, nondistended, + BS Ext: without cyanosis, clubbing, or edema, Good distal pulses bilaterally Neuro:  Alert and Oriented x 3,  Psych: euthymic mood, full affect  Wt Readings from Last 3 Encounters:  05/18/17 242 lb 3.2 oz (109.9 kg)  05/10/17 243 lb 2 oz (110.3 kg)  05/08/17  243 lb 12.8 oz (110.6 kg)      Studies/Labs Reviewed:   EKG:  EKG is not ordered today.  Recent Labs: 05/05/2017: B Natriuretic Peptide 112.5 05/08/2017: Magnesium 1.8 05/10/2017: ALT 18; BUN 26; Creatinine, Ser 1.08; Hemoglobin 13.0; Platelets 258.0; Potassium 4.5; Sodium 138   Lipid Panel    Component Value Date/Time   CHOL 148 10/30/2015 1102   TRIG 68.0 10/30/2015 1102   HDL 60.50 10/30/2015 1102   CHOLHDL 2 10/30/2015 1102   VLDL 13.6 10/30/2015 1102   LDLCALC 74 10/30/2015 1102   LDLDIRECT 166.0 04/14/2010 1539    Additional studies/ records that were reviewed today include:  FINDINGS: 1. No significant CAD. 2. Upper normal/mildly elevated left and right heart filling pressures. 3. Low normal to mildly decreased cardiac output/index. 4. Severely reduced systolic function; LVEF ~91-47%.   RECOMMENDATIONS: 1. Medical therapy with optimization of evidence-based heart failure therapy.   Nelva Bush, MD   ECHO: - Technically difficult study. Definity contrast given. Compared to   a prior echo in 2011, LVEF is lower at 40-45% with predominant   inferior and inferoseptal hypokinesis.           ASSESSMENT:    1. Nonischemic cardiomyopathy ([facility])   2. Chronic combined systolic and diastolic CHF (congestive heart failure) ([facility])   3. Essential hypertension   4. Morbid obesity ([facility])      PLAN:  In order of problems listed above:  Nonischemic cardiomyopathy ejection fraction 30-35% on 2D echo, nonobstructive CAD on cath.  Started on Entresto 24/26 mg twice daily.  We will check be met today and increase Entresto to 49/51 mg twice daily.  Follow-up in 3-4 weeks for further titration with myself or Dr. Marlou Porch.  Chronic combined systolic and diastolic CHF compensated today.  Patient has lots of questions concerning diet and the  amount of sodium she can have.  2 g sodium diet.  Refer to nutritionist and weight loss management program with Dr. Leafy Ro or Dr.  Adair Patter  Essential hypertension blood pressure controlled  Morbid obesity weight loss program recommended.  Will refer to the obesity weight loss clinic program as well as nutritionist.    Medication Adjustments/Labs and Tests Ordered: Current medicines are reviewed at length with the patient today.  Concerns regarding medicines are outlined above.  Medication changes, Labs and Tests ordered today are listed in the Patient Instructions below. Patient Instructions  Medication Instructions:  Your physician has recommended you make the following change in your medication:  1-Increase Entresto 49/51 mg by mouth twice daily.  Labwork: Your physician recommends that you have lab work today- BMET.   Testing/Procedures: NONE  Follow-Up: Your physician wants you to follow-up in: 3 to 4 weeks with Dr. Marlou Porch or Ermalinda Barrios PA for heart failure management.   You have been referred to Nutritionist   You have been referred to Weight Loss Clinic   If you need a refill on your cardiac medications before your next appointment, please call your pharmacy.    Low-Sodium Eating Plan Sodium, which is an element that makes up salt, helps you maintain a healthy balance of fluids in your body. Too much sodium can increase your blood pressure and cause fluid and waste to be held in your body. Your health care provider or dietitian may recommend following this plan if you have high blood pressure (hypertension), kidney disease, liver disease, or heart failure. Eating less sodium can help lower your blood pressure, reduce swelling, and protect your heart, liver, and kidneys. What are tips for following this plan? General guidelines  Most people on this plan should limit their sodium intake to 1,500-2,000 mg (milligrams) of sodium each day. Reading food labels  The Nutrition Facts label lists the amount of sodium in one serving of the food. If you eat more than one serving, you must multiply the  listed amount of sodium by the number of servings.  Choose foods with less than 140 mg of sodium per serving.  Avoid foods with 300 mg of sodium or more per serving. Shopping  Look for lower-sodium products, often labeled as "low-sodium" or "no salt added."  Always check the sodium content even if foods are labeled as "unsalted" or "no salt added".  Buy fresh foods. ? Avoid canned foods and premade or frozen meals. ? Avoid canned, cured, or processed meats  Buy breads that have less than 80 mg of sodium per slice. Cooking  Eat more home-cooked food and less restaurant, buffet, and fast food.  Avoid adding salt when cooking. Use salt-free seasonings or herbs instead of table salt or sea salt. Check with your health care provider or pharmacist before using salt substitutes.  Cook with plant-based oils, such as canola, sunflower, or olive oil. Meal planning  When eating at a restaurant, ask that your food be prepared with less salt or no salt, if possible.  Avoid foods that contain MSG (monosodium glutamate). MSG is sometimes added to [place] food, bouillon, and some canned foods. What foods are recommended? The items listed may not be a complete list. Talk with your dietitian about what dietary choices are best for you. Grains Low-sodium cereals, including oats, puffed wheat and rice, and shredded wheat. Low-sodium crackers. Unsalted rice. Unsalted pasta. Low-sodium bread. Whole-grain breads and whole-grain pasta. Vegetables Fresh or frozen vegetables. "No salt added" canned vegetables. "No  salt added" tomato sauce and paste. Low-sodium or reduced-sodium tomato and vegetable juice. Fruits Fresh, frozen, or canned fruit. Fruit juice. Meats and other protein foods Fresh or frozen (no salt added) meat, poultry, seafood, and fish. Low-sodium canned tuna and salmon. Unsalted nuts. Dried peas, beans, and lentils without added salt. Unsalted canned beans. Eggs. Unsalted nut  butters. Dairy Milk. Soy milk. Cheese that is naturally low in sodium, such as ricotta cheese, fresh mozzarella, or Swiss cheese Low-sodium or reduced-sodium cheese. Cream cheese. Yogurt. Fats and oils Unsalted butter. Unsalted margarine with no trans fat. Vegetable oils such as canola or olive oils. Seasonings and other foods Fresh and dried herbs and spices. Salt-free seasonings. Low-sodium mustard and ketchup. Sodium-free salad dressing. Sodium-free light mayonnaise. Fresh or refrigerated horseradish. Lemon juice. Vinegar. Homemade, reduced-sodium, or low-sodium soups. Unsalted popcorn and pretzels. Low-salt or salt-free chips. What foods are not recommended? The items listed may not be a complete list. Talk with your dietitian about what dietary choices are best for you. Grains Instant hot cereals. Bread stuffing, pancake, and biscuit mixes. Croutons. Seasoned rice or pasta mixes. Noodle soup cups. Boxed or frozen macaroni and cheese. Regular salted crackers. Self-rising flour. Vegetables Sauerkraut, pickled vegetables, and relishes. Olives. [place] fries. Onion rings. Regular canned vegetables (not low-sodium or reduced-sodium). Regular canned tomato sauce and paste (not low-sodium or reduced-sodium). Regular tomato and vegetable juice (not low-sodium or reduced-sodium). Frozen vegetables in sauces. Meats and other protein foods Meat or fish that is salted, canned, smoked, spiced, or pickled. Bacon, ham, sausage, hotdogs, corned beef, chipped beef, packaged lunch meats, salt pork, jerky, pickled herring, anchovies, regular canned tuna, sardines, salted nuts. Dairy Processed cheese and cheese spreads. Cheese curds. Blue cheese. Feta cheese. String cheese. Regular cottage cheese. Buttermilk. Canned milk. Fats and oils Salted butter. Regular margarine. Ghee. Bacon fat. Seasonings and other foods Onion salt, garlic salt, seasoned salt, table salt, and sea salt. Canned and packaged gravies.  Worcestershire sauce. Tartar sauce. Barbecue sauce. Teriyaki sauce. Soy sauce, including reduced-sodium. Steak sauce. Fish sauce. Oyster sauce. Cocktail sauce. Horseradish that you find on the shelf. Regular ketchup and mustard. Meat flavorings and tenderizers. Bouillon cubes. Hot sauce and Tabasco sauce. Premade or packaged marinades. Premade or packaged taco seasonings. Relishes. Regular salad dressings. Salsa. Potato and tortilla chips. Corn chips and puffs. Salted popcorn and pretzels. Canned or dried soups. Pizza. Frozen entrees and pot pies. Summary  Eating less sodium can help lower your blood pressure, reduce swelling, and protect your heart, liver, and kidneys.  Most people on this plan should limit their sodium intake to 1,500-2,000 mg (milligrams) of sodium each day.  Canned, boxed, and frozen foods are high in sodium. Restaurant foods, fast foods, and pizza are also very high in sodium. You also get sodium by adding salt to food.  Try to cook at home, eat more fresh fruits and vegetables, and eat less fast food, canned, processed, or prepared foods. This information is not intended to replace advice given to you by your health care provider. Make sure you discuss any questions you have with your health care provider. Document Released: 07/17/2001 Document Revised: 01/19/2016 Document Reviewed: 01/19/2016 Elsevier Interactive Patient Education  2018 [place], Ermalinda Barrios, [place]  05/18/2017 2:42 PM    [facility] [address], [place], [place]  [address] Phone: [phone]; Fax: [phone]

## 2017-05-18 NOTE — Patient Instructions (Addendum)
Medication Instructions:  Your physician has recommended you make the following change in your medication:  1-Increase Entresto 49/51 mg by mouth twice daily.  Labwork: Your physician recommends that you have lab work today- BMET.   Testing/Procedures: NONE  Follow-Up: Your physician wants you to follow-up in: 3 to 4 weeks with Dr. Marlou Porch or Ermalinda Barrios PA for heart failure management.   You have been referred to Nutritionist   You have been referred to Weight Loss Clinic   If you need a refill on your cardiac medications before your next appointment, please call your pharmacy.    Low-Sodium Eating Plan Sodium, which is an element that makes up salt, helps you maintain a healthy balance of fluids in your body. Too much sodium can increase your blood pressure and cause fluid and waste to be held in your body. Your health care provider or dietitian may recommend following this plan if you have high blood pressure (hypertension), kidney disease, liver disease, or heart failure. Eating less sodium can help lower your blood pressure, reduce swelling, and protect your heart, liver, and kidneys. What are tips for following this plan? General guidelines  Most people on this plan should limit their sodium intake to 1,500-2,000 mg (milligrams) of sodium each day. Reading food labels  The Nutrition Facts label lists the amount of sodium in one serving of the food. If you eat more than one serving, you must multiply the listed amount of sodium by the number of servings.  Choose foods with less than 140 mg of sodium per serving.  Avoid foods with 300 mg of sodium or more per serving. Shopping  Look for lower-sodium products, often labeled as "low-sodium" or "no salt added."  Always check the sodium content even if foods are labeled as "unsalted" or "no salt added".  Buy fresh foods. ? Avoid canned foods and premade or frozen meals. ? Avoid canned, cured, or processed meats  Buy  breads that have less than 80 mg of sodium per slice. Cooking  Eat more home-cooked food and less restaurant, buffet, and fast food.  Avoid adding salt when cooking. Use salt-free seasonings or herbs instead of table salt or sea salt. Check with your health care provider or pharmacist before using salt substitutes.  Cook with plant-based oils, such as canola, sunflower, or olive oil. Meal planning  When eating at a restaurant, ask that your food be prepared with less salt or no salt, if possible.  Avoid foods that contain MSG (monosodium glutamate). MSG is sometimes added to [place] food, bouillon, and some canned foods. What foods are recommended? The items listed may not be a complete list. Talk with your dietitian about what dietary choices are best for you. Grains Low-sodium cereals, including oats, puffed wheat and rice, and shredded wheat. Low-sodium crackers. Unsalted rice. Unsalted pasta. Low-sodium bread. Whole-grain breads and whole-grain pasta. Vegetables Fresh or frozen vegetables. "No salt added" canned vegetables. "No salt added" tomato sauce and paste. Low-sodium or reduced-sodium tomato and vegetable juice. Fruits Fresh, frozen, or canned fruit. Fruit juice. Meats and other protein foods Fresh or frozen (no salt added) meat, poultry, seafood, and fish. Low-sodium canned tuna and salmon. Unsalted nuts. Dried peas, beans, and lentils without added salt. Unsalted canned beans. Eggs. Unsalted nut butters. Dairy Milk. Soy milk. Cheese that is naturally low in sodium, such as ricotta cheese, fresh mozzarella, or Swiss cheese Low-sodium or reduced-sodium cheese. Cream cheese. Yogurt. Fats and oils Unsalted butter. Unsalted margarine with no trans fat.  Vegetable oils such as canola or olive oils. Seasonings and other foods Fresh and dried herbs and spices. Salt-free seasonings. Low-sodium mustard and ketchup. Sodium-free salad dressing. Sodium-free light mayonnaise. Fresh or  refrigerated horseradish. Lemon juice. Vinegar. Homemade, reduced-sodium, or low-sodium soups. Unsalted popcorn and pretzels. Low-salt or salt-free chips. What foods are not recommended? The items listed may not be a complete list. Talk with your dietitian about what dietary choices are best for you. Grains Instant hot cereals. Bread stuffing, pancake, and biscuit mixes. Croutons. Seasoned rice or pasta mixes. Noodle soup cups. Boxed or frozen macaroni and cheese. Regular salted crackers. Self-rising flour. Vegetables Sauerkraut, pickled vegetables, and relishes. Olives. [place] fries. Onion rings. Regular canned vegetables (not low-sodium or reduced-sodium). Regular canned tomato sauce and paste (not low-sodium or reduced-sodium). Regular tomato and vegetable juice (not low-sodium or reduced-sodium). Frozen vegetables in sauces. Meats and other protein foods Meat or fish that is salted, canned, smoked, spiced, or pickled. Bacon, ham, sausage, hotdogs, corned beef, chipped beef, packaged lunch meats, salt pork, jerky, pickled herring, anchovies, regular canned tuna, sardines, salted nuts. Dairy Processed cheese and cheese spreads. Cheese curds. Blue cheese. Feta cheese. String cheese. Regular cottage cheese. Buttermilk. Canned milk. Fats and oils Salted butter. Regular margarine. Ghee. Bacon fat. Seasonings and other foods Onion salt, garlic salt, seasoned salt, table salt, and sea salt. Canned and packaged gravies. Worcestershire sauce. Tartar sauce. Barbecue sauce. Teriyaki sauce. Soy sauce, including reduced-sodium. Steak sauce. Fish sauce. Oyster sauce. Cocktail sauce. Horseradish that you find on the shelf. Regular ketchup and mustard. Meat flavorings and tenderizers. Bouillon cubes. Hot sauce and Tabasco sauce. Premade or packaged marinades. Premade or packaged taco seasonings. Relishes. Regular salad dressings. Salsa. Potato and tortilla chips. Corn chips and puffs. Salted popcorn and pretzels.  Canned or dried soups. Pizza. Frozen entrees and pot pies. Summary  Eating less sodium can help lower your blood pressure, reduce swelling, and protect your heart, liver, and kidneys.  Most people on this plan should limit their sodium intake to 1,500-2,000 mg (milligrams) of sodium each day.  Canned, boxed, and frozen foods are high in sodium. Restaurant foods, fast foods, and pizza are also very high in sodium. You also get sodium by adding salt to food.  Try to cook at home, eat more fresh fruits and vegetables, and eat less fast food, canned, processed, or prepared foods. This information is not intended to replace advice given to you by your health care provider. Make sure you discuss any questions you have with your health care provider. Document Released: 07/17/2001 Document Revised: 01/19/2016 Document Reviewed: 01/19/2016 Elsevier Interactive Patient Education  [employer].

## 2017-05-19 LAB — BASIC METABOLIC PANEL
BUN/Creatinine Ratio: 17 (ref 12–28)
BUN: 19 mg/dL (ref 8–27)
CO2: 24 mmol/L (ref 20–29)
Calcium: 9.3 mg/dL (ref 8.7–10.3)
Chloride: 102 mmol/L (ref 96–106)
Creatinine, Ser: 1.12 mg/dL — ABNORMAL HIGH (ref 0.57–1.00)
GFR calc Af Amer: 57 mL/min/{1.73_m2} — ABNORMAL LOW (ref >59)
GFR calc non Af Amer: 49 mL/min/{1.73_m2} — ABNORMAL LOW (ref >59)
Glucose: 90 mg/dL (ref 65–99)
Potassium: 4.7 mmol/L (ref 3.5–5.2)
Sodium: 140 mmol/L (ref 134–144)

## 2017-05-20 ENCOUNTER — Telehealth: Payer: Self-pay

## 2017-05-20 NOTE — Telephone Encounter (Signed)
I have done an Entresto PA through covermymeds. 

## 2017-05-20 NOTE — Telephone Encounter (Signed)
**Note De-Identified Allen Egerton Obfuscation** Letter received [name] fax from [facility] stating that the pt has a valid Entresto approval valid until 02/07/2038.  I have faxed this letter to the pts pharmacy.

## 2017-05-23 MED ORDER — SACUBITRIL-VALSARTAN 49-51 MG PO TABS: 1.0000 | ORAL_TABLET | Freq: Two times a day (BID) | ORAL | 3 refills | Status: DC

## 2017-05-23 NOTE — Addendum Note (Signed)
**Note De-Identified [name] Obfuscation** Addended by: Dennie Fetters on: 05/23/2017 04:00 PM   Modules accepted: Orders

## 2017-05-23 NOTE — Telephone Encounter (Addendum)
**Note De-Identified  Obfuscation** The pt called me back and stated that she has called her insurance company but was still confused as they advised her that her [name] will cost her $250.05 for a month supply.  I advised her that I will call them to try to get an idea why her [name] is so expensive even after a PA was approved.  I called [facility] help desk at [phone] and s/w Jovan who advise me that if the pt will switch her Rx to their mail in pharmacy she will be able to get a 90 day supply of Entresto for only $125. Enid Baas recommends that the pt call them back so they can get her her set up to receive her Entresto through the mail from them if she is interested.  I called the pt back and she stated that she can afford $125 for a 90 day supply of Entresto. I gave her the number that I called for [facility]. I have sent her [name] RX to [facility] to fill once the pt is set up through the mail in pharmacy.

## 2017-05-23 NOTE — Telephone Encounter (Signed)
**Note De-Identified  Obfuscation** The pt called and stated that her pharmacy is saying that her [name] will cost her $200 for a month supply of Entresto even though we got an approval on a PA from [facility].  I have advised her to contact her insurance company and ask why the cost for her [name] is $200 as there maybe a deductible left to pay.  She verbalized understanding and states that she will contact [place] and that she will call me back if there is anything I can do to help.

## 2017-05-24 ENCOUNTER — Telehealth: Payer: Self-pay | Admitting: Cardiology

## 2017-05-24 NOTE — Telephone Encounter (Signed)
**Note De-Identified  Obfuscation** Per the pts request I have sent her Raiford Noble to [facility] in pharmacy.

## 2017-05-24 NOTE — Telephone Encounter (Signed)
Patient calling,   States that is returning call back to you and that she also has some questions to discuss with you.

## 2017-06-01 ENCOUNTER — Telehealth: Payer: Self-pay | Admitting: Gastroenterology

## 2017-06-01 NOTE — Telephone Encounter (Signed)
Patient states she picked Bentyl up from the pharmacy but does not ever remember taking it. Informed patient that we prescribed Bentyl 09/2016 and we have not refilled since then and the medication is not currently on her list. Patient states she got a call from the pharmacy to pick it up. Informed patient the only medication we refilled was the omeprazole. Told patient it sounds like a mistake from the pharmacy and that they refilled the wrong medication. Patient states she will call the pharmacy.

## 2017-06-01 NOTE — Telephone Encounter (Signed)
Left a message for patient to return my call. 

## 2017-06-02 ENCOUNTER — Encounter: Payer: Self-pay | Admitting: Physician Assistant

## 2017-06-10 ENCOUNTER — Ambulatory Visit (INDEPENDENT_AMBULATORY_CARE_PROVIDER_SITE_OTHER): Payer: Medicare Other

## 2017-06-10 ENCOUNTER — Ambulatory Visit (INDEPENDENT_AMBULATORY_CARE_PROVIDER_SITE_OTHER): Payer: Medicare Other | Admitting: Psychology

## 2017-06-10 ENCOUNTER — Telehealth: Payer: Self-pay | Admitting: Cardiology

## 2017-06-10 DIAGNOSIS — E538 Deficiency of other specified B group vitamins: Secondary | ICD-10-CM | POA: Diagnosis not present

## 2017-06-10 DIAGNOSIS — F332 Major depressive disorder, recurrent severe without psychotic features: Secondary | ICD-10-CM

## 2017-06-10 MED ORDER — CYANOCOBALAMIN 1000 MCG/ML IJ SOLN
1000.0000 ug | Freq: Once | INTRAMUSCULAR | Status: AC
  Administered 2017-06-10: 1000 ug via INTRAMUSCULAR

## 2017-06-10 MED ORDER — FUROSEMIDE 20 MG PO TABS: 20.0000 mg | ORAL_TABLET | Freq: Every day | ORAL | 3 refills | Status: DC

## 2017-06-10 MED ORDER — METOPROLOL SUCCINATE ER 50 MG PO TB24: 50.0000 mg | ORAL_TABLET | Freq: Every day | ORAL | 3 refills | Status: DC

## 2017-06-10 NOTE — Telephone Encounter (Signed)
Pt was prescribed these medications in the hospital. Would Dr. Marlou Porch like to refill these medications, Furosemide and metoprolol? Pt wanted to know if she still needs to take these medications. Please address

## 2017-06-10 NOTE — Telephone Encounter (Signed)
Pt's medication was sent to pt's pharmacy as requested. Confirmation received.  °

## 2017-06-10 NOTE — Telephone Encounter (Signed)
These medications were started in the hospital but pt was seen in the office 05/18/2017 by Ermalinda Barrios, PA who instructed her to continue current medications as listed.  These are appropriate for refill.

## 2017-06-10 NOTE — Progress Notes (Signed)
Patient came in today to have her monthly b-12 injection  She tolerated 98mL in her  Left deltoid with no complications

## 2017-06-14 ENCOUNTER — Ambulatory Visit: Payer: Self-pay | Admitting: Gastroenterology

## 2017-06-14 ENCOUNTER — Ambulatory Visit: Payer: Self-pay | Admitting: Internal Medicine

## 2017-06-16 ENCOUNTER — Encounter (INDEPENDENT_AMBULATORY_CARE_PROVIDER_SITE_OTHER): Payer: Self-pay

## 2017-06-20 DIAGNOSIS — M79642 Pain in left hand: Secondary | ICD-10-CM | POA: Diagnosis not present

## 2017-06-20 DIAGNOSIS — M79641 Pain in right hand: Secondary | ICD-10-CM | POA: Diagnosis not present

## 2017-06-20 DIAGNOSIS — M25561 Pain in right knee: Secondary | ICD-10-CM | POA: Diagnosis not present

## 2017-06-21 ENCOUNTER — Encounter: Payer: Self-pay | Admitting: Physician Assistant

## 2017-06-21 ENCOUNTER — Ambulatory Visit (INDEPENDENT_AMBULATORY_CARE_PROVIDER_SITE_OTHER): Payer: Medicare Other | Admitting: Physician Assistant

## 2017-06-21 VITALS — BP 130/84 | HR 60 | Ht 66.0 in | Wt 246.0 lb

## 2017-06-21 DIAGNOSIS — I428 Other cardiomyopathies: Secondary | ICD-10-CM | POA: Diagnosis not present

## 2017-06-21 DIAGNOSIS — I10 Essential (primary) hypertension: Secondary | ICD-10-CM | POA: Diagnosis not present

## 2017-06-21 DIAGNOSIS — I5042 Chronic combined systolic (congestive) and diastolic (congestive) heart failure: Secondary | ICD-10-CM

## 2017-06-21 NOTE — Progress Notes (Signed)
Cardiology Office Note    Date:  06/21/2017   ID:  [name], DOB [birth year], MRN [id number]  PCP:  Colon Branch, MD  Cardiologist: Candee Furbish, MD  No chief complaint on file.   History of Present Illness:  [name] is a 72 y.o. female with a history of hypertension, chronic diastolic CHF, anxiety, Barrett's esophagus who presented to the hospital with worsening shortness of breath weight gain and chest discomfort. She was found to have a nonischemic cardiomyopathy LVEF 40-45% with inferior and inferior septal hypokinesis on 2D echo. Cardiac cath 05/06/17 showed no significant CAD upper normal elevated left and right heart filling pressures and severely reduced LV function EF 30-35%. Patient was started on Entresto 24/26 mg twice daily. Lasix reduced to 20 mg daily. Also on Toprol.   I saw the patient 05/18/2017 and over all heart failure was compensated.  She had a lot of confusion on 2 g sodium diet which was discussed in detail with her.  I referred her to nutritionist and weight loss management program.  I increased her Entresto to 49/51 mg twice daily.  Patient comes in today for follow-up.  She is complaining about weight gain but now fluid buildup.  She denies any chest pain, dyspnea, dyspnea on exertion or edema.  She says a weight loss center will not be covered by her insurance.  She is asking about weight watchers.  Also started on a steroid taper yesterday by her orthopedist but was afraid to take it.     Past Medical History:  Diagnosis Date  . Anemia   . Anxiety   . Barrett's esophagus   . Bilateral carpal tunnel syndrome    Dr. Eddie Dibbles, having injection therapy  . C. difficile diarrhea 08/01/2012   severe 2014  . Chronic cystitis    Dr. Matilde Sprang  . Chronic lower back pain   . Depression   . DJD (degenerative joint disease)    bilateral hands; knees  . Family history of anesthesia complication    Mother had severe N/V  . GERD (gastroesophageal reflux  disease)   . H/O cardiac catheterization    (-) cath 12-2002  , cath again 2011 (-)  . HTN (hypertension)   . Hyperlipidemia   . Insomnia   . Migraine   . Osteoporosis    pt unsure of this  . RLS (restless legs syndrome)   . Vitamin B 12 deficiency 04/09/2013    Past Surgical History:  Procedure Laterality Date  . Arm surgery Left    "don't remember what they did; arm wasn't broken"  . BILATERAL KNEE ARTHROSCOPY Bilateral   . CARDIAC CATHETERIZATION  01/22/10   clean cath  . CATARACT EXTRACTION W/ INTRAOCULAR LENS  IMPLANT, BILATERAL Bilateral   . CHOLECYSTECTOMY N/A 10/25/2016   Procedure: LAPAROSCOPIC CHOLECYSTECTOMY;  Surgeon: Georganna Skeans, MD;  Location: [facility];  Service: General;  Laterality: N/A;  . COLONOSCOPY W/ POLYPECTOMY    . FOOT SURGERY Bilateral    toenails removed; callus removed on right; hammertoes right"  . Knot     "removed from right neck; not a goiter"  . LUMBAR DISC SURGERY     L5 S1 anterior fusion  . OOPHORECTOMY    . RIGHT/LEFT HEART CATH AND CORONARY ANGIOGRAPHY N/A 05/06/2017   Procedure: RIGHT/LEFT HEART CATH AND CORONARY ANGIOGRAPHY;  Surgeon: Nelva Bush, MD;  Location: [facility];  Service: Cardiovascular;  Laterality: N/A;  . SHOULDER ARTHROSCOPY Right   .  TOTAL KNEE ARTHROPLASTY  10/05/2011   Procedure: TOTAL KNEE ARTHROPLASTY;  Surgeon: Hessie Dibble, MD;  Location: [facility];  Service: Orthopedics;  Laterality: Right;  [name] VAGINAL HYSTERECTOMY     for endometriosis    Current Medications: Current Meds  Medication Sig  . acetaminophen (TYLENOL) 500 MG tablet Take 1,000 mg by mouth every 6 (six) hours as needed for mild pain or headache.  [name] aspirin EC 81 MG tablet Take 81 mg by mouth daily.  . clonazePAM (KLONOPIN) 0.5 MG tablet Take 1 tablet (0.5 mg total) by mouth 3 (three) times daily as needed for anxiety.  . furosemide (LASIX) 20 MG tablet Take 1 tablet (20 mg total) by mouth daily.  [name] HYDROcodone-acetaminophen (NORCO) 10-325  MG tablet Take 1 tablet by mouth every 4 (four) hours as needed for moderate pain or severe pain. For back pain  . Menthol-Camphor (ICY HOT ADVANCED RELIEF) 16-11 % CREA Apply 1 application topically as needed (SHOULDER PAIN).  [name] metoprolol succinate (TOPROL-XL) 50 MG 24 hr tablet Take 1 tablet (50 mg total) by mouth daily. Take with or immediately following a meal.  . nitroGLYCERIN (NITROSTAT) 0.4 MG SL tablet Place 1 tablet (0.4 mg total) under the tongue every 5 (five) minutes as needed for chest pain.  [name] omeprazole (PRILOSEC) 40 MG capsule TAKE 1 CAPSULE (40 MG TOTAL) BY MOUTH 2 (TWO) TIMES DAILY.  [name] omeprazole (PRILOSEC) 40 MG capsule TAKE 1 CAPSULE (40 MG TOTAL) BY MOUTH 2 (TWO) TIMES DAILY.  [name] Propylene Glycol (SYSTANE BALANCE OP) Apply 1 drop to eye as needed (DRY EYES).  [name] rOPINIRole (REQUIP) 1 MG tablet TAKE 1 TABLET (1 MG TOTAL) BY MOUTH 2 (TWO) TIMES DAILY.  . sacubitril-valsartan (ENTRESTO) 49-51 MG Take 1 tablet by mouth 2 (two) times daily.  [name] tiZANidine (ZANAFLEX) 4 MG tablet Take 4 mg by mouth 2 (two) times daily as needed for muscle spasms.  . traZODone (DESYREL) 50 MG tablet Take 1-2 tablets (50-100 mg total) by mouth at bedtime.     Allergies:   Dextromethorphan-guaifenesin; Morphine; and Penicillins   Social History   Socioeconomic History  . Marital status: Divorced    Spouse name: Not on file  . Number of children: 1  . Years of education: Not on file  . Highest education level: Not on file  Occupational History  . Occupation: Retired, post Ecologist: RETIRED  Social Needs  . Financial resource strain: Not on file  . Food insecurity:    Worry: Not on file    Inability: Not on file  . Transportation needs:    Medical: Not on file    Non-medical: Not on file  Tobacco Use  . Smoking status: Former Smoker    Packs/day: 0.50    Years: 10.00    Pack years: 5.00    Last attempt to quit: 02/09/1975    Years since quitting: 42.3  . Smokeless tobacco:  Never Used  . Tobacco comment: started at age 63.   quit in the 53s.  Substance and Sexual Activity  . Alcohol use: Yes    Alcohol/week: 0.0 oz    Comment: occ  . Drug use: No    Comment: CBD and hemp OIL   . Sexual activity: Not on file  Lifestyle  . Physical activity:    Days per week: Not on file    Minutes per session: Not on file  . Stress: Not on file  Relationships  . Social  connections:    Talks on phone: Not on file    Gets together: Not on file    Attends religious service: Not on file    Active member of club or organization: Not on file    Attends meetings of clubs or organizations: Not on file    Relationship status: Not on file  Other Topics Concern  . Not on file  Social History Narrative   Son lives w/ her      Family History:  The patient's family history includes Breast cancer in her sister; CAD in her father; Colon cancer in her maternal grandfather and maternal uncle; Dementia in her father; Diabetes in her other; Esophageal cancer in her brother; Lung cancer in her mother; Stroke in her father.   ROS:   Please see the history of present illness.    Review of Systems  Constitution: Negative.  HENT: Negative.   Eyes: Negative.   Cardiovascular: Negative.   Respiratory: Negative.   Hematologic/Lymphatic: Negative.   Musculoskeletal: Negative.  Negative for joint pain.  Gastrointestinal: Negative.   Genitourinary: Negative.   Neurological: Negative.    All other systems reviewed and are negative.   PHYSICAL EXAM:   VS:  BP 130/84 (BP Location: Right Arm, Patient Position: Sitting, Cuff Size: Normal)   Pulse 60   Ht '5\' 6"'  (1.676 m)   Wt 246 lb (111.6 kg)   SpO2 97%   BMI 39.71 kg/m   Physical Exam  GEN: Obese, in no acute distress  Neck: no JVD, carotid bruits, or masses Cardiac:RRR; no murmurs, rubs, or gallops  Respiratory:  clear to auscultation bilaterally, normal work of breathing GI: soft, nontender, nondistended, + BS Ext: without  cyanosis, clubbing, or edema, Good distal pulses bilaterally Neuro:  Alert and Oriented x 3 Psych: euthymic mood, full affect  Wt Readings from Last 3 Encounters:  06/21/17 246 lb (111.6 kg)  05/18/17 242 lb 3.2 oz (109.9 kg)  05/10/17 243 lb 2 oz (110.3 kg)      Studies/Labs Reviewed:   EKG:  EKG is not ordered today.   Recent Labs: 05/05/2017: B Natriuretic Peptide 112.5 05/08/2017: Magnesium 1.8 05/10/2017: ALT 18; Hemoglobin 13.0; Platelets 258.0 05/18/2017: BUN 19; Creatinine, Ser 1.12; Potassium 4.7; Sodium 140   Lipid Panel    Component Value Date/Time   CHOL 148 10/30/2015 1102   TRIG 68.0 10/30/2015 1102   HDL 60.50 10/30/2015 1102   CHOLHDL 2 10/30/2015 1102   VLDL 13.6 10/30/2015 1102   LDLCALC 74 10/30/2015 1102   LDLDIRECT 166.0 04/14/2010 1539    Additional studies/ records that were reviewed today include:  Echo 3/29/2019Study Conclusions   - Procedure narrative: Transthoracic echocardiography. Technically   difficult study with reduced echo windows. Intravenous contrast   (Definity) was administered. - Left ventricle: The cavity size was normal. Wall thickness was   increased in a pattern of moderate LVH. Systolic function was   mildly to moderately reduced. The estimated ejection fraction was   in the range of 40% to 45%. Inferoseptal hypokinesis. Doppler   parameters are consistent with abnormal left ventricular   relaxation (grade 1 diastolic dysfunction). The E/e&' ratio is   between 8-15, suggesting indeterminate LV filling pressure. - Aortic valve: Sclerosis without stenosis. There was trivial   regurgitation. - Mitral valve: Mildly thickened leaflets . There was trivial   regurgitation. - Left atrium: The atrium was normal in size. - Inferior vena cava: The vessel was normal in size. The   respirophasic  diameter changes were in the normal range (>= 50%),   consistent with normal central venous pressure.   Impressions:   - Technically difficult  study. Definity contrast given. Compared to   a prior echo in 2011, LVEF is lower at 40-45% with predominant   inferior and inferoseptal hypokinesis.   Cardiac cath 3/29/2019Conclusions: 1. No angiographically significant coronary artery disease. 2. Moderately to severely reduced left ventricular contraction (LVEF 30-35%), consistent with non-ischemic cardiomyopathy. 3. Upper normal to mildly elevated left heart, right heart, and pulmonary artery pressures. 4. Low normal to mildly decreased cardiac output/index.   Recommendations: 1. Optimize evidence-based heart failure therapy. 2. Primary prevention of coronary artery disease.   Nelva Bush, MD [facility] Pager: [phone]     ASSESSMENT:    1. Chronic combined systolic and diastolic CHF (congestive heart failure) ([place])   2. Nonischemic cardiomyopathy ([place])   3. Essential hypertension   4. Mixed hyperlipidemia   5. Morbid obesity ([place])      PLAN:  In order of problems listed above:  Chronic combined systolic and diastolic CHF LVEF 30 to 97% on cath 40 to 45% on echo.  Entresto titrated up last office visit to 49/51 mg twice daily.  Creatinine 1.12 on 05/18/2017.  Heart failure compensated today.  We will check be met and titrate Entresto up if renal function is stable.  Follow-up with me in 3 weeks in 2 months with Dr. Marlou Porch.  Nonischemic cardiomyopathy EF as above.  Titrating meds  Essential hypertension blood pressure 130/84 and we can increase Entresto if needed  Morbid obesity importance of weight loss discussed with patient and she is considering weight watchers since the weight loss clinic was not covered by her insurance.    Medication Adjustments/Labs and Tests Ordered: Current medicines are reviewed at length with the patient today.  Concerns regarding medicines are outlined above.  Medication changes, Labs and Tests ordered today are listed in the Patient Instructions below. Patient Instructions    Medication Instructions:  Your physician recommends that you continue on your current medications as directed. Please refer to the Current Medication list given to you today.  Labwork: Your physician recommends that you have lab work today-BMET  Testing/Procedures: NONE  Follow-Up: Your physician recommends that you schedule a follow-up appointment in:  3 weeks with Ermalinda Barrios PA.   Your physician wants you to follow-up in: 2 months with Dr. Marlou Porch.    If you need a refill on your cardiac medications before your next appointment, please call your pharmacy.       Signed, Ermalinda Barrios, PA-C  06/21/2017 1:33 PM    [facility] [name], [place], [place]  [address] Phone: [phone]; Fax: [phone]

## 2017-06-21 NOTE — Patient Instructions (Addendum)
Medication Instructions:  Your physician recommends that you continue on your current medications as directed. Please refer to the Current Medication list given to you today.  Labwork: Your physician recommends that you have lab work today-BMET  Testing/Procedures: NONE  Follow-Up: Your physician recommends that you schedule a follow-up appointment in: 3 weeks with Ermalinda Barrios PA.   Your physician wants you to follow-up in: 2 months with Dr. Marlou Porch.    If you need a refill on your cardiac medications before your next appointment, please call your pharmacy.

## 2017-06-22 ENCOUNTER — Telehealth: Payer: Self-pay | Admitting: *Deleted

## 2017-06-22 LAB — BASIC METABOLIC PANEL
BUN/Creatinine Ratio: 18 (ref 12–28)
BUN: 17 mg/dL (ref 8–27)
CO2: 24 mmol/L (ref 20–29)
Calcium: 9.2 mg/dL (ref 8.7–10.3)
Chloride: 102 mmol/L (ref 96–106)
Creatinine, Ser: 0.94 mg/dL (ref 0.57–1.00)
GFR calc Af Amer: 70 mL/min/{1.73_m2} (ref >59)
GFR calc non Af Amer: 61 mL/min/{1.73_m2} (ref >59)
Glucose: 90 mg/dL (ref 65–99)
Potassium: 4.4 mmol/L (ref 3.5–5.2)
Sodium: 141 mmol/L (ref 134–144)

## 2017-06-22 MED ORDER — SACUBITRIL-VALSARTAN 97-103 MG PO TABS: 1.0000 | ORAL_TABLET | Freq: Two times a day (BID) | ORAL | 3 refills | Status: DC

## 2017-06-22 NOTE — Telephone Encounter (Signed)
-----   Message from Imogene Burn, PA-C sent at 06/22/2017  9:49 AM EDT ----- Renal function normal. Increase entresto to 97/103 mg BID. Repeat renal in 3 weeks

## 2017-06-22 NOTE — Progress Notes (Signed)
Pt has been made aware of normal result and verbalized understanding.  jw 06/22/17 Pt will increase the Entresto to 97/103. Pt will have bmet repeated at her o/v 07/12/17 with Ermalinda Barrios, PA-C. Pt verbalized understanding.

## 2017-06-29 ENCOUNTER — Ambulatory Visit (INDEPENDENT_AMBULATORY_CARE_PROVIDER_SITE_OTHER): Payer: Self-pay | Admitting: Family Medicine

## 2017-07-08 ENCOUNTER — Ambulatory Visit: Payer: Medicare Other

## 2017-07-08 ENCOUNTER — Ambulatory Visit: Payer: Medicare Other | Admitting: Psychology

## 2017-07-08 ENCOUNTER — Encounter: Payer: Self-pay | Admitting: Internal Medicine

## 2017-07-08 ENCOUNTER — Ambulatory Visit (INDEPENDENT_AMBULATORY_CARE_PROVIDER_SITE_OTHER): Payer: Medicare Other | Admitting: Internal Medicine

## 2017-07-08 VITALS — BP 126/68 | HR 68 | Temp 98.2°F | Resp 16 | Ht 66.0 in | Wt 242.2 lb

## 2017-07-08 DIAGNOSIS — J029 Acute pharyngitis, unspecified: Secondary | ICD-10-CM | POA: Diagnosis not present

## 2017-07-08 DIAGNOSIS — I5042 Chronic combined systolic (congestive) and diastolic (congestive) heart failure: Secondary | ICD-10-CM | POA: Diagnosis not present

## 2017-07-08 DIAGNOSIS — J069 Acute upper respiratory infection, unspecified: Secondary | ICD-10-CM | POA: Diagnosis not present

## 2017-07-08 LAB — POCT RAPID STREP A (OFFICE): Rapid Strep A Screen: NEGATIVE

## 2017-07-08 MED ORDER — AZELASTINE HCL 0.1 % NA SOLN: 2.0000 | Freq: Two times a day (BID) | NASAL | 6 refills | Status: DC

## 2017-07-08 NOTE — Progress Notes (Signed)
Subjective:    Patient ID: [name], female    DOB: May 13, 1945, 72 y.o.   MRN: [id number]  DOS:  07/08/2017 Type of visit - description : acute Interval history: Symptoms started 2 weeks ago with mild nose itching. Symptoms increased for the last week: Frequent cough, runny nose, sneezing. Reports she is "hurting on my ribs from coughing". CHF: During the weekend, she was at the beach for a wedding, at the time she had leg swelling, that is now resolved  Review of Systems  No actual fever but she feels hot and cold. No sputum production No nausea, vomiting, myalgias.  Past Medical History:  Diagnosis Date  . Anemia   . Anxiety   . Barrett's esophagus   . Bilateral carpal tunnel syndrome    Dr. Eddie Dibbles, having injection therapy  . C. difficile diarrhea 08/01/2012   severe 2014  . Chronic cystitis    Dr. Matilde Sprang  . Chronic lower back pain   . Depression   . DJD (degenerative joint disease)    bilateral hands; knees  . Family history of anesthesia complication    Mother had severe N/V  . GERD (gastroesophageal reflux disease)   . H/O cardiac catheterization    (-) cath 12-2002  , cath again 2011 (-)  . HTN (hypertension)   . Hyperlipidemia   . Insomnia   . Migraine   . Osteoporosis    pt unsure of this  . RLS (restless legs syndrome)   . Vitamin B 12 deficiency 04/09/2013    Past Surgical History:  Procedure Laterality Date  . Arm surgery Left    "don't remember what they did; arm wasn't broken"  . BILATERAL KNEE ARTHROSCOPY Bilateral   . CARDIAC CATHETERIZATION  01/22/10   clean cath  . CATARACT EXTRACTION W/ INTRAOCULAR LENS  IMPLANT, BILATERAL Bilateral   . CHOLECYSTECTOMY N/A 10/25/2016   Procedure: LAPAROSCOPIC CHOLECYSTECTOMY;  Surgeon: Georganna Skeans, MD;  Location: [facility];  Service: General;  Laterality: N/A;  . COLONOSCOPY W/ POLYPECTOMY    . FOOT SURGERY Bilateral    toenails removed; callus removed on right; hammertoes right"  . Knot     "removed from right neck; not a goiter"  . LUMBAR DISC SURGERY     L5 S1 anterior fusion  . OOPHORECTOMY    . RIGHT/LEFT HEART CATH AND CORONARY ANGIOGRAPHY N/A 05/06/2017   Procedure: RIGHT/LEFT HEART CATH AND CORONARY ANGIOGRAPHY;  Surgeon: Nelva Bush, MD;  Location: [facility];  Service: Cardiovascular;  Laterality: N/A;  . SHOULDER ARTHROSCOPY Right   . TOTAL KNEE ARTHROPLASTY  10/05/2011   Procedure: TOTAL KNEE ARTHROPLASTY;  Surgeon: Hessie Dibble, MD;  Location: [facility];  Service: Orthopedics;  Laterality: Right;  [name] VAGINAL HYSTERECTOMY     for endometriosis    Social History   Socioeconomic History  . Marital status: Divorced    Spouse name: Not on file  . Number of children: 1  . Years of education: Not on file  . Highest education level: Not on file  Occupational History  . Occupation: Retired, post Ecologist: RETIRED  Social Needs  . Financial resource strain: Not on file  . Food insecurity:    Worry: Not on file    Inability: Not on file  . Transportation needs:    Medical: Not on file    Non-medical: Not on file  Tobacco Use  . Smoking status: Former Smoker    Packs/day: 0.50  Years: 10.00    Pack years: 5.00    Last attempt to quit: 02/09/1975    Years since quitting: 42.4  . Smokeless tobacco: Never Used  . Tobacco comment: started at age 55.   quit in the 8s.  Substance and Sexual Activity  . Alcohol use: Yes    Alcohol/week: 0.0 oz    Comment: occ  . Drug use: No    Comment: CBD and hemp OIL   . Sexual activity: Not on file  Lifestyle  . Physical activity:    Days per week: Not on file    Minutes per session: Not on file  . Stress: Not on file  Relationships  . Social connections:    Talks on phone: Not on file    Gets together: Not on file    Attends religious service: Not on file    Active member of club or organization: Not on file    Attends meetings of clubs or organizations: Not on file    Relationship  status: Not on file  . Intimate partner violence:    Fear of current or ex partner: Not on file    Emotionally abused: Not on file    Physically abused: Not on file    Forced sexual activity: Not on file  Other Topics Concern  . Not on file  Social History Narrative   Son lives w/ her       Allergies as of 07/08/2017      Reactions   Dextromethorphan-guaifenesin Nausea And Vomiting   Morphine Nausea And Vomiting   Penicillins Rash   "> 30 years ago; best I can remember it was just a light rash on my arm"      Medication List        Accurate as of 07/08/17 11:59 PM. Always use your most recent med list.          acetaminophen 500 MG tablet Commonly known as:  TYLENOL Take 1,000 mg by mouth every 6 (six) hours as needed for mild pain or headache.   aspirin EC 81 MG tablet Take 81 mg by mouth daily.   azelastine 0.1 % nasal spray Commonly known as:  ASTELIN Place 2 sprays into both nostrils 2 (two) times daily.   clonazePAM 0.5 MG tablet Commonly known as:  KLONOPIN Take 1 tablet (0.5 mg total) by mouth 3 (three) times daily as needed for anxiety.   furosemide 20 MG tablet Commonly known as:  LASIX Take 1 tablet (20 mg total) by mouth daily.   HYDROcodone-acetaminophen 10-325 MG tablet Commonly known as:  NORCO Take 1 tablet by mouth every 4 (four) hours as needed for moderate pain or severe pain. For back pain   ICY HOT ADVANCED RELIEF 16-11 % Crea Generic drug:  Menthol-Camphor Apply 1 application topically as needed (SHOULDER PAIN).   metoprolol succinate 50 MG 24 hr tablet Commonly known as:  TOPROL-XL Take 1 tablet (50 mg total) by mouth daily. Take with or immediately following a meal.   nitroGLYCERIN 0.4 MG SL tablet Commonly known as:  NITROSTAT Place 1 tablet (0.4 mg total) under the tongue every 5 (five) minutes as needed for chest pain.   omeprazole 40 MG capsule Commonly known as:  PRILOSEC TAKE 1 CAPSULE (40 MG TOTAL) BY MOUTH 2 (TWO) TIMES  DAILY.   rOPINIRole 1 MG tablet Commonly known as:  REQUIP TAKE 1 TABLET (1 MG TOTAL) BY MOUTH 2 (TWO) TIMES DAILY.   sacubitril-valsartan 97-103 MG Commonly  known as:  ENTRESTO Take 1 tablet by mouth 2 (two) times daily.   SYSTANE BALANCE OP Apply 1 drop to eye as needed (DRY EYES).   tiZANidine 4 MG tablet Commonly known as:  ZANAFLEX Take 4 mg by mouth 2 (two) times daily as needed for muscle spasms.   traZODone 50 MG tablet Commonly known as:  DESYREL Take 1-2 tablets (50-100 mg total) by mouth at bedtime.          Objective:   Physical Exam BP 126/68 (BP Location: Left Arm, Patient Position: Sitting, Cuff Size: Normal)   Pulse 68   Temp 98.2 F (36.8 C) (Oral)   Resp 16   Ht 5\' 6"  (1.676 m)   Wt 242 lb 4 oz (109.9 kg)   SpO2 93%   BMI 39.10 kg/m  General:   Well developed, well nourished . NAD.  HEENT:  Normocephalic . Face symmetric, atraumatic. TMs slightly bulged but not red.  Throat, symmetric, slightly red, no white patches.  Nose congested. Lungs:  few rhonchi with cough. Normal respiratory effort, no intercostal retractions, no accessory muscle use. Heart: RRR,  no murmur.  trace pretibial edema bilaterally  Skin: Not pale. Not jaundice Neurologic:  alert & oriented X3.  Speech normal, gait appropriate for age and unassisted Psych--  Cognition and judgment appear intact.  Cooperative with normal attention span and concentration.  Behavior appropriate. No anxious or depressed appearing.      Assessment & Plan:    Assessment HTN Hyperlipidemia- Pravachol, Crestor: Myalgias. Intolerant to Zetia (joint aches), see OV  06/2016  Depression, Anxiety (on clonazepam), insomnia (on trazodone): depression x many  years, remotely on zoloft and other meds per psych, I rx lexapro 2015, then switch to effexor ; Morbid obesity CV: CHF/ non ischemic cardiomyopathy dx 04-2017; cath normal coronaries, EF ~ 30% GI:  --GERD, Barrett's esophagus, EGD  05-2015 --h/o persistent C. difficile diarrhea 2014 B12 deficiency Osteopenia: Dexa 2015 showed osteopenia, T score -1.9 (10/2016): Rx cs, vit D RLS - Requip, see Pulmonary Migraines, f/u [facility] -- off topamax as off 06-2016 MSK: --Back pain, chronic: pain meds rx by ortho, Dr Eddie Dibbles retired, rx per Workers comp MD --DJD --CTS B OAB, LUTS-- on myrbetriq Dr McDarmoth  H/o Cardiac that confirmed the EF of 2011 and 2014 negative  PLAN URI: Upper respiratory symptoms, URI versus allergies.  Symptoms also coincide with increase in Entresto dose. Plan: Supportive treatment for now, call if not gradually improving.  Flonase, Astelin, Mucinex,claritin. CHF: Saw cardiology 06/21/2017, BMP okay, Entresto dose increased. RTC as scheduled for 08-2017

## 2017-07-08 NOTE — Progress Notes (Signed)
Pre visit review using our clinic review tool, if applicable. No additional management support is needed unless otherwise documented below in the visit note. 

## 2017-07-08 NOTE — Patient Instructions (Addendum)
For cough:  Take Mucinex DM twice a day as needed until better  For nasal congestion: Use OTC  Flonase : 2 nasal sprays on each side of the nose in the morning until you feel better Use ASTELIN a prescribed spray : 2 nasal sprays on each side of the nose twice a day until you feel better   Avoid decongestants such as  Pseudoephedrine or phenylephrine   claritin  OTC: Half or 1 tablet every day until better  Call if not gradually better over the next 5-7  days  Call anytime if the symptoms are severe

## 2017-07-10 NOTE — Assessment & Plan Note (Signed)
URI: Upper respiratory symptoms, URI versus allergies.  Symptoms also coincide with increase in Entresto dose. Plan: Supportive treatment for now, call if not gradually improving.  Flonase, Astelin, Mucinex,claritin. CHF: Saw cardiology 06/21/2017, BMP okay, Entresto dose increased. RTC as scheduled for 08-2017

## 2017-07-11 NOTE — Progress Notes (Signed)
Cardiology Office Note    Date:  07/12/2017   ID:  [name], DOB April 22, 1945, MRN [id number]  PCP:  Colon Branch, MD  Cardiologist: Candee Furbish, MD  No chief complaint on file.   History of Present Illness:  [name] is a 72 y.o. female with a history of hypertension, chronic diastolic CHF, anxiety, Barrett's esophagus who was found to have a nonischemic cardiomyopathy LVEF 40 to 45% with inferior and inferior septal hypokinesis on 2D echo 04/2017.  Cardiac cath 05/06/2017 no significant CAD, upper normal elevated left and right heart filling pressures and severely reduced LV function EF 30 to 35%.  Patient was started on Entresto and Lasix was decreased.  I referred her to a nutritionist and weight loss management program and have been titrating her Entresto up.  I most recently saw her 06/21/2017 at which time her insurance would not pay for the weight loss center.  She was also started on steroid taper by her orthopedist but was afraid to take it.  Renal function was stable and I titrated her Entresto up to the 97/103 mg twice daily.  She then saw Dr. Larose Kells 07/08/2017 complaining of upper respiratory symptoms and was treated conservatively.  Patient comes in for f/u. Went to [employer] over [employer] Day weekend. Had bbq and feet were swollen to a point she had to buy a new pair of shoes. Swelling is down now.  She is working hard on diet and weight loss.  Weight is down 4 pounds.  Chronic dyspnea on exertion.  Whenever she is doing her housework she has to stop and sit down and then can continue on.  Was told she has allergies and was put on Claritin and Flonase.     Past Medical History:  Diagnosis Date  . Anemia   . Anxiety   . Barrett's esophagus   . Bilateral carpal tunnel syndrome    Dr. Eddie Dibbles, having injection therapy  . C. difficile diarrhea 08/01/2012   severe 2014  . Chronic cystitis    Dr. Matilde Sprang  . Chronic lower back pain   . Depression   . DJD (degenerative joint  disease)    bilateral hands; knees  . Family history of anesthesia complication    Mother had severe N/V  . GERD (gastroesophageal reflux disease)   . H/O cardiac catheterization    (-) cath 12-2002  , cath again 2011 (-)  . HTN (hypertension)   . Hyperlipidemia   . Insomnia   . Migraine   . Osteoporosis    pt unsure of this  . RLS (restless legs syndrome)   . Vitamin B 12 deficiency 04/09/2013    Past Surgical History:  Procedure Laterality Date  . Arm surgery Left    "don't remember what they did; arm wasn't broken"  . BILATERAL KNEE ARTHROSCOPY Bilateral   . CARDIAC CATHETERIZATION  01/22/10   clean cath  . CATARACT EXTRACTION W/ INTRAOCULAR LENS  IMPLANT, BILATERAL Bilateral   . CHOLECYSTECTOMY N/A 10/25/2016   Procedure: LAPAROSCOPIC CHOLECYSTECTOMY;  Surgeon: Georganna Skeans, MD;  Location: [place];  Service: General;  Laterality: N/A;  . COLONOSCOPY W/ POLYPECTOMY    . FOOT SURGERY Bilateral    toenails removed; callus removed on right; hammertoes right"  . Knot     "removed from right neck; not a goiter"  . LUMBAR DISC SURGERY     L5 S1 anterior fusion  . OOPHORECTOMY    . RIGHT/LEFT HEART CATH AND  CORONARY ANGIOGRAPHY N/A 05/06/2017   Procedure: RIGHT/LEFT HEART CATH AND CORONARY ANGIOGRAPHY;  Surgeon: Nelva Bush, MD;  Location: [place] [facility];  Service: Cardiovascular;  Laterality: N/A;  . SHOULDER ARTHROSCOPY Right   . TOTAL KNEE ARTHROPLASTY  10/05/2011   Procedure: TOTAL KNEE ARTHROPLASTY;  Surgeon: Hessie Dibble, MD;  Location: [place];  Service: Orthopedics;  Laterality: Right;  [name] VAGINAL HYSTERECTOMY     for endometriosis    Current Medications: Current Meds  Medication Sig  . acetaminophen (TYLENOL) 500 MG tablet Take 1,000 mg by mouth every 6 (six) hours as needed for mild pain or headache.  [name] aspirin EC 81 MG tablet Take 81 mg by mouth daily.  [name] azelastine (ASTELIN) 0.1 % nasal spray Place 2 sprays into both nostrils 2 (two) times daily.  .  clonazePAM (KLONOPIN) 0.5 MG tablet Take 1 tablet (0.5 mg total) by mouth 3 (three) times daily as needed for anxiety.  . furosemide (LASIX) 20 MG tablet Take 1 tablet (20 mg total) by mouth daily.  [name] HYDROcodone-acetaminophen (NORCO) 10-325 MG tablet Take 1 tablet by mouth every 4 (four) hours as needed for moderate pain or severe pain. For back pain  . Menthol-Camphor (ICY HOT ADVANCED RELIEF) 16-11 % CREA Apply 1 application topically as needed (SHOULDER PAIN).  [name] metoprolol succinate (TOPROL-XL) 50 MG 24 hr tablet Take 1 tablet (50 mg total) by mouth daily. Take with or immediately following a meal.  . nitroGLYCERIN (NITROSTAT) 0.4 MG SL tablet Place 1 tablet (0.4 mg total) under the tongue every 5 (five) minutes as needed for chest pain.  [name] omeprazole (PRILOSEC) 40 MG capsule TAKE 1 CAPSULE (40 MG TOTAL) BY MOUTH 2 (TWO) TIMES DAILY.  [name] Propylene Glycol (SYSTANE BALANCE OP) Apply 1 drop to eye as needed (DRY EYES).  [name] rOPINIRole (REQUIP) 1 MG tablet TAKE 1 TABLET (1 MG TOTAL) BY MOUTH 2 (TWO) TIMES DAILY.  . sacubitril-valsartan (ENTRESTO) 97-103 MG Take 1 tablet by mouth 2 (two) times daily.  [name] tiZANidine (ZANAFLEX) 4 MG tablet Take 4 mg by mouth 2 (two) times daily as needed for muscle spasms.  . traZODone (DESYREL) 50 MG tablet Take 1-2 tablets (50-100 mg total) by mouth at bedtime.     Allergies:   Dextromethorphan-guaifenesin; Morphine; and Penicillins   Social History   Socioeconomic History  . Marital status: Divorced    Spouse name: Not on file  . Number of children: 1  . Years of education: Not on file  . Highest education level: Not on file  Occupational History  . Occupation: Retired, post Ecologist: RETIRED  Social Needs  . Financial resource strain: Not on file  . Food insecurity:    Worry: Not on file    Inability: Not on file  . Transportation needs:    Medical: Not on file    Non-medical: Not on file  Tobacco Use  . Smoking status: Former Smoker     Packs/day: 0.50    Years: 10.00    Pack years: 5.00    Last attempt to quit: 02/09/1975    Years since quitting: 42.4  . Smokeless tobacco: Never Used  . Tobacco comment: started at age 83.   quit in the 53s.  Substance and Sexual Activity  . Alcohol use: Yes    Alcohol/week: 0.0 oz    Comment: occ  . Drug use: No    Comment: CBD and hemp OIL   . Sexual activity: Not  on file  Lifestyle  . Physical activity:    Days per week: Not on file    Minutes per session: Not on file  . Stress: Not on file  Relationships  . Social connections:    Talks on phone: Not on file    Gets together: Not on file    Attends religious service: Not on file    Active member of club or organization: Not on file    Attends meetings of clubs or organizations: Not on file    Relationship status: Not on file  Other Topics Concern  . Not on file  Social History Narrative   Son lives w/ her      Family History:  The patient's family history includes Breast cancer in her sister; CAD in her father; Colon cancer in her maternal grandfather and maternal uncle; Dementia in her father; Diabetes in her other; Esophageal cancer in her brother; Lung cancer in her mother; Stroke in her father.   ROS:   Please see the history of present illness.    Review of Systems  Constitution: Negative.  HENT: Negative.   Eyes: Negative.   Cardiovascular: Positive for dyspnea on exertion and leg swelling.  Respiratory: Positive for cough.   Hematologic/Lymphatic: Negative.   Musculoskeletal: Negative.  Negative for joint pain.  Gastrointestinal: Negative.   Genitourinary: Negative.   Neurological: Negative.    All other systems reviewed and are negative.   PHYSICAL EXAM:   VS:  BP 110/76   Pulse 62   Ht _0  (1.676 m)   Wt 242 lb (109.8 kg)   SpO2 98%   BMI 39.06 kg/m   Physical Exam  GEN: Well nourished, well developed, in no acute distress  Neck: no JVD, carotid bruits, or masses Cardiac:RRR; no murmurs,  rubs, or gallops  Respiratory:  clear to auscultation bilaterally, normal work of breathing GI: soft, nontender, nondistended, + BS Ext: without cyanosis, clubbing, or edema, Good distal pulses bilaterally Neuro:  Alert and Oriented x 3 Psych: euthymic mood, full affect  Wt Readings from Last 3 Encounters:  07/12/17 242 lb (109.8 kg)  07/08/17 242 lb 4 oz (109.9 kg)  06/21/17 246 lb (111.6 kg)      Studies/Labs Reviewed:   EKG:  EKG is not ordered today.    Recent Labs: 05/05/2017: B Natriuretic Peptide 112.5 05/08/2017: Magnesium 1.8 05/10/2017: ALT 18; Hemoglobin 13.0; Platelets 258.0 06/21/2017: BUN 17; Creatinine, Ser 0.94; Potassium 4.4; Sodium 141   Lipid Panel    Component Value Date/Time   CHOL 148 10/30/2015 1102   TRIG 68.0 10/30/2015 1102   HDL 60.50 10/30/2015 1102   CHOLHDL 2 10/30/2015 1102   VLDL 13.6 10/30/2015 1102   LDLCALC 74 10/30/2015 1102   LDLDIRECT 166.0 04/14/2010 1539    Additional studies/ records that were reviewed today include:    Echo 3/29/2019Study Conclusions   - Procedure narrative: Transthoracic echocardiography. Technically   difficult study with reduced echo windows. Intravenous contrast   (Definity) was administered. - Left ventricle: The cavity size was normal. Wall thickness was   increased in a pattern of moderate LVH. Systolic function was   mildly to moderately reduced. The estimated ejection fraction was   in the range of 40% to 45%. Inferoseptal hypokinesis. Doppler   parameters are consistent with abnormal left ventricular   relaxation (grade 1 diastolic dysfunction). The E/e&' ratio is   between 8-15, suggesting indeterminate LV filling pressure. - Aortic valve: Sclerosis without stenosis. There was trivial  regurgitation. - Mitral valve: Mildly thickened leaflets . There was trivial   regurgitation. - Left atrium: The atrium was normal in size. - Inferior vena cava: The vessel was normal in size. The   respirophasic  diameter changes were in the normal range (>= 50%),   consistent with normal central venous pressure.   Impressions:   - Technically difficult study. Definity contrast given. Compared to   a prior echo in 2011, LVEF is lower at 40-45% with predominant   inferior and inferoseptal hypokinesis.   Cardiac cath 3/29/2019Conclusions: 1. No angiographically significant coronary artery disease. 2. Moderately to severely reduced left ventricular contraction (LVEF 30-35%), consistent with non-ischemic cardiomyopathy. 3. Upper normal to mildly elevated left heart, right heart, and pulmonary artery pressures. 4. Low normal to mildly decreased cardiac output/index.   Recommendations: 1. Optimize evidence-based heart failure therapy. 2. Primary prevention of coronary artery disease.   Nelva Bush, MD [facility] Pager: [phone]         ASSESSMENT:    1. Chronic combined systolic and diastolic CHF (congestive heart failure) ([place])   2. Nonischemic cardiomyopathy ([place])   3. Essential hypertension   4. Mixed hyperlipidemia   5. Morbid obesity ([place])      PLAN:  In order of problems listed above:  Chronic combined systolic and diastolic CHF EF 30 to 67% on cath, 40 to 45% on echo. Entresto titrated up to  97/103 mg twice daily last office visit.  She did get extra fluid over Memorial Day weekend when eating out at the beach.  This has resolved.  No evidence of heart failure on exam.  Continue current medications.  Check be met today.  Last creatinine 0.94 on 06/21/2017.  Follow-up with Dr. Marlou Porch next month.  Nonischemic cardiomyopathy ejection fraction 30 to 35% on cath 40 to 45% on echo normal coronary arteries.  Hopefully this will recover with maximum medical therapy  Essential hypertension blood pressure well controlled  Morbid obesity insurance will not cover weight loss clinic so she is going to try weight watchers.    Medication Adjustments/Labs and Tests  Ordered: Current medicines are reviewed at length with the patient today.  Concerns regarding medicines are outlined above.  Medication changes, Labs and Tests ordered today are listed in the Patient Instructions below. Patient Instructions  Medication Instructions: Your physician recommends that you continue on your current medications as directed. Please refer to the Current Medication list given to you today.   Labwork: TODAY: BMET   Procedures/Testing: None Ordered  Follow-Up: .  Any Additional Special Instructions Will Be Listed Below (If Applicable).     If you need a refill on your cardiac medications before your next appointment, please call your pharmacy.      Sumner Boast, PA-C  07/12/2017 11:26 AM    [facility] [place], [place], [place]  [address] Phone: [phone]; Fax: [phone]

## 2017-07-12 ENCOUNTER — Encounter: Payer: Self-pay | Admitting: Physician Assistant

## 2017-07-12 ENCOUNTER — Ambulatory Visit (INDEPENDENT_AMBULATORY_CARE_PROVIDER_SITE_OTHER): Payer: Medicare Other | Admitting: Physician Assistant

## 2017-07-12 VITALS — BP 110/76 | HR 62 | Ht 66.0 in | Wt 242.0 lb

## 2017-07-12 DIAGNOSIS — I10 Essential (primary) hypertension: Secondary | ICD-10-CM

## 2017-07-12 DIAGNOSIS — I5042 Chronic combined systolic (congestive) and diastolic (congestive) heart failure: Secondary | ICD-10-CM

## 2017-07-12 DIAGNOSIS — E782 Mixed hyperlipidemia: Secondary | ICD-10-CM | POA: Diagnosis not present

## 2017-07-12 DIAGNOSIS — I428 Other cardiomyopathies: Secondary | ICD-10-CM

## 2017-07-12 LAB — BASIC METABOLIC PANEL
BUN/Creatinine Ratio: 15 (ref 12–28)
BUN: 15 mg/dL (ref 8–27)
CO2: 26 mmol/L (ref 20–29)
Calcium: 9 mg/dL (ref 8.7–10.3)
Chloride: 102 mmol/L (ref 96–106)
Creatinine, Ser: 0.98 mg/dL (ref 0.57–1.00)
GFR calc Af Amer: 67 mL/min/{1.73_m2} (ref >59)
GFR calc non Af Amer: 58 mL/min/{1.73_m2} — ABNORMAL LOW (ref >59)
Glucose: 96 mg/dL (ref 65–99)
Potassium: 4.4 mmol/L (ref 3.5–5.2)
Sodium: 142 mmol/L (ref 134–144)

## 2017-07-12 NOTE — Patient Instructions (Addendum)
Medication Instructions: Your physician recommends that you continue on your current medications as directed. Please refer to the Current Medication list given to you today.   Labwork: TODAY: BMET   Procedures/Testing: None Ordered  Follow-Up: Keep follow up appointment with Dr.Skains on 08/25/17  Any Additional Special Instructions Will Be Listed Below (If Applicable).     If you need a refill on your cardiac medications before your next appointment, please call your pharmacy.

## 2017-07-14 NOTE — Progress Notes (Signed)
Pt has been made aware of normal result and verbalized understanding.  jw 07/14/17

## 2017-07-22 ENCOUNTER — Ambulatory Visit (INDEPENDENT_AMBULATORY_CARE_PROVIDER_SITE_OTHER): Payer: Medicare Other | Admitting: Internal Medicine

## 2017-07-22 ENCOUNTER — Ambulatory Visit: Payer: Self-pay | Admitting: Internal Medicine

## 2017-07-22 ENCOUNTER — Other Ambulatory Visit: Payer: Self-pay | Admitting: Gastroenterology

## 2017-07-22 ENCOUNTER — Ambulatory Visit (INDEPENDENT_AMBULATORY_CARE_PROVIDER_SITE_OTHER): Payer: Medicare Other | Admitting: Psychology

## 2017-07-22 ENCOUNTER — Encounter: Payer: Self-pay | Admitting: Internal Medicine

## 2017-07-22 VITALS — BP 132/68 | HR 66 | Temp 98.1°F | Resp 16 | Ht 66.0 in | Wt 241.0 lb

## 2017-07-22 DIAGNOSIS — F332 Major depressive disorder, recurrent severe without psychotic features: Secondary | ICD-10-CM

## 2017-07-22 DIAGNOSIS — I7300 Raynaud's syndrome without gangrene: Secondary | ICD-10-CM | POA: Diagnosis not present

## 2017-07-22 DIAGNOSIS — S91301A Unspecified open wound, right foot, initial encounter: Secondary | ICD-10-CM

## 2017-07-22 MED ORDER — DOXYCYCLINE HYCLATE 100 MG PO TABS: 100.0000 mg | ORAL_TABLET | Freq: Two times a day (BID) | ORAL | 0 refills | Status: DC

## 2017-07-22 MED ORDER — HYDROCORTISONE 2.5 % EX CREA: TOPICAL_CREAM | Freq: Two times a day (BID) | CUTANEOUS | 0 refills | Status: DC

## 2017-07-22 MED ORDER — MUPIROCIN CALCIUM 2 % EX CREA: 1.0000 "application " | TOPICAL_CREAM | Freq: Two times a day (BID) | CUTANEOUS | 0 refills | Status: DC

## 2017-07-22 NOTE — Progress Notes (Signed)
Pre visit review using our clinic review tool, if applicable. No additional management support is needed unless otherwise documented below in the visit note. 

## 2017-07-22 NOTE — Patient Instructions (Addendum)
Take doxycycline x 5 days  Apply both Bactroban and the hydrocortisone cream twice a day  Call if not gradually better  Avoid cold exposure of your hands, use gloves in cold days.    Raynaud Phenomenon Raynaud phenomenon is a condition that affects the blood vessels (arteries) that carry blood to your fingers and toes. The arteries that supply blood to your ears or the tip of your nose might also be affected. Raynaud phenomenon causes the arteries to temporarily narrow. As a result, the flow of blood to the affected areas is temporarily decreased. This usually occurs in response to cold temperatures or stress. During an attack, the skin in the affected areas turns white. You may also feel tingling or numbness in those areas. Attacks usually last for only a brief period, and then the blood flow to the area returns to normal. In most cases, Raynaud phenomenon does not cause serious health problems. What are the causes? For many people with this condition, the cause is not known. Raynaud phenomenon is sometimes associated with other diseases, such as scleroderma or lupus. What increases the risk? Raynaud phenomenon can affect anyone, but it develops most often in people who are 36-50 years old. It affects more females than males. What are the signs or symptoms? Symptoms of Raynaud phenomenon may occur when you are exposed to cold temperatures or when you have emotional stress. The symptoms may last for a few minutes or up to several hours. They usually affect your fingers but may also affect your toes, ears, or the tip of your nose. Symptoms may include:  Changes in skin color. The skin in the affected areas will turn pale or white. The skin may then change from white to bluish to red as normal blood flow returns to the area.  Numbness, tingling, or pain in the affected areas.  In severe cases, sores may develop in the affected areas. How is this diagnosed? Your health care provider will do a  physical exam and take your medical history. You may be asked to put your hands in cold water to check for a reaction to cold temperature. Blood tests may be done to check for other diseases or conditions. Your health care provider may also order a test to check the movement of blood through your arteries and veins (vascular ultrasound). How is this treated? Treatment often involves making lifestyle changes and taking steps to control your exposure to cold temperatures. For more severe cases, medicine (calcium channel blockers) may be used to improve blood flow. Surgery is sometimes done to block the nerves that control the affected arteries, but this is rare. Follow these instructions at home:  Avoid exposure to cold by taking these steps: ? If possible, stay indoors during cold weather. ? When you go outside during cold weather, dress in layers and wear mittens, a hat, a scarf, and warm footwear. ? Wear mittens or gloves when handling ice or frozen food. ? Use holders for glasses or cans containing cold drinks. ? Let warm water run for a while before taking a shower or bath. ? Warm up the car before driving in cold weather.  If possible, avoid stressful and emotional situations. Exercise, meditation, and yoga may help you cope with stress. Biofeedback may be useful.  Do not use any tobacco products, including cigarettes, chewing tobacco, or electronic cigarettes. If you need help quitting, ask your health care provider.  Avoid secondhand smoke.  Limit your use of caffeine. Switch to decaffeinated coffee,  tea, and soda. Avoid chocolate.  Wear loose fitting socks and comfortable, roomy shoes.  Avoid vibrating tools and machinery.  Take medicines only as directed by your health care provider. Contact a health care provider if:  Your discomfort becomes worse despite lifestyle changes.  You develop sores on your fingers or toes that do not heal.  Your fingers or toes turn black.  You  have breaks in the skin on your fingers or toes.  You have a fever.  You have pain or swelling in your joints.  You have a rash.  Your symptoms occur on only one side of your body. This information is not intended to replace advice given to you by your health care provider. Make sure you discuss any questions you have with your health care provider. Document Released: 01/23/2000 Document Revised: 07/03/2015 Document Reviewed: 07/30/2015 Elsevier Interactive Patient Education  2017 [employer].

## 2017-07-22 NOTE — Progress Notes (Signed)
Subjective:    Patient ID: [name], female    DOB: 26-Feb-1945, 72 y.o.   MRN: [id number]  DOS:  07/22/2017 Type of visit - description : acute Interval history: 2 weeks ago, removed a tick from between the first and second toes on the right foot. Does not recall if the tick was engorged. She is here because she still has a wound there.   Review of Systems Fever chills No rash but yesterday the area was a slightly red.  Not today. No unusual headaches, aches or pains. Reports she feels very tired but does note a new concern to her.   Past Medical History:  Diagnosis Date  . Anemia   . Anxiety   . Barrett's esophagus   . Bilateral carpal tunnel syndrome    Dr. Eddie Dibbles, having injection therapy  . C. difficile diarrhea 08/01/2012   severe 2014  . Chronic cystitis    Dr. Matilde Sprang  . Chronic lower back pain   . Depression   . DJD (degenerative joint disease)    bilateral hands; knees  . Family history of anesthesia complication    Mother had severe N/V  . GERD (gastroesophageal reflux disease)   . H/O cardiac catheterization    (-) cath 12-2002  , cath again 2011 (-)  . HTN (hypertension)   . Hyperlipidemia   . Insomnia   . Migraine   . Osteoporosis    pt unsure of this  . RLS (restless legs syndrome)   . Vitamin B 12 deficiency 04/09/2013    Past Surgical History:  Procedure Laterality Date  . Arm surgery Left    "don't remember what they did; arm wasn't broken"  . BILATERAL KNEE ARTHROSCOPY Bilateral   . CARDIAC CATHETERIZATION  01/22/10   clean cath  . CATARACT EXTRACTION W/ INTRAOCULAR LENS  IMPLANT, BILATERAL Bilateral   . CHOLECYSTECTOMY N/A 10/25/2016   Procedure: LAPAROSCOPIC CHOLECYSTECTOMY;  Surgeon: Georganna Skeans, MD;  Location: [place];  Service: General;  Laterality: N/A;  . COLONOSCOPY W/ POLYPECTOMY    . FOOT SURGERY Bilateral    toenails removed; callus removed on right; hammertoes right"  . Knot     "removed from right neck; not a goiter"    . LUMBAR DISC SURGERY     L5 S1 anterior fusion  . OOPHORECTOMY    . RIGHT/LEFT HEART CATH AND CORONARY ANGIOGRAPHY N/A 05/06/2017   Procedure: RIGHT/LEFT HEART CATH AND CORONARY ANGIOGRAPHY;  Surgeon: Nelva Bush, MD;  Location: [facility];  Service: Cardiovascular;  Laterality: N/A;  . SHOULDER ARTHROSCOPY Right   . TOTAL KNEE ARTHROPLASTY  10/05/2011   Procedure: TOTAL KNEE ARTHROPLASTY;  Surgeon: Hessie Dibble, MD;  Location: [place];  Service: Orthopedics;  Laterality: Right;  [name] VAGINAL HYSTERECTOMY     for endometriosis    Social History   Socioeconomic History  . Marital status: Divorced    Spouse name: Not on file  . Number of children: 1  . Years of education: Not on file  . Highest education level: Not on file  Occupational History  . Occupation: Retired, post Ecologist: RETIRED  Social Needs  . Financial resource strain: Not on file  . Food insecurity:    Worry: Not on file    Inability: Not on file  . Transportation needs:    Medical: Not on file    Non-medical: Not on file  Tobacco Use  . Smoking status: Former Smoker  Packs/day: 0.50    Years: 10.00    Pack years: 5.00    Last attempt to quit: 02/09/1975    Years since quitting: 42.4  . Smokeless tobacco: Never Used  . Tobacco comment: started at age 77.   quit in the 25s.  Substance and Sexual Activity  . Alcohol use: Yes    Alcohol/week: 0.0 oz    Comment: occ  . Drug use: No    Comment: CBD and hemp OIL   . Sexual activity: Not on file  Lifestyle  . Physical activity:    Days per week: Not on file    Minutes per session: Not on file  . Stress: Not on file  Relationships  . Social connections:    Talks on phone: Not on file    Gets together: Not on file    Attends religious service: Not on file    Active member of club or organization: Not on file    Attends meetings of clubs or organizations: Not on file    Relationship status: Not on file  . Intimate partner  violence:    Fear of current or ex partner: Not on file    Emotionally abused: Not on file    Physically abused: Not on file    Forced sexual activity: Not on file  Other Topics Concern  . Not on file  Social History Narrative   Son lives w/ her       Allergies as of 07/22/2017      Reactions   Dextromethorphan-guaifenesin Nausea And Vomiting   Morphine Nausea And Vomiting   Penicillins Rash   "> 30 years ago; best I can remember it was just a light rash on my arm"      Medication List        Accurate as of 07/22/17  3:47 PM. Always use your most recent med list.          acetaminophen 500 MG tablet Commonly known as:  TYLENOL Take 1,000 mg by mouth every 6 (six) hours as needed for mild pain or headache.   aspirin EC 81 MG tablet Take 81 mg by mouth daily.   azelastine 0.1 % nasal spray Commonly known as:  ASTELIN Place 2 sprays into both nostrils 2 (two) times daily.   clonazePAM 0.5 MG tablet Commonly known as:  KLONOPIN Take 1 tablet (0.5 mg total) by mouth 3 (three) times daily as needed for anxiety.   doxycycline 100 MG tablet Commonly known as:  VIBRA-TABS Take 1 tablet (100 mg total) by mouth 2 (two) times daily.   furosemide 20 MG tablet Commonly known as:  LASIX Take 1 tablet (20 mg total) by mouth daily.   HYDROcodone-acetaminophen 10-325 MG tablet Commonly known as:  NORCO Take 1 tablet by mouth every 4 (four) hours as needed for moderate pain or severe pain. For back pain   hydrocortisone 2.5 % cream Apply topically 2 (two) times daily.   ICY HOT ADVANCED RELIEF 16-11 % Crea Generic drug:  Menthol-Camphor Apply 1 application topically as needed (SHOULDER PAIN).   metoprolol succinate 50 MG 24 hr tablet Commonly known as:  TOPROL-XL Take 1 tablet (50 mg total) by mouth daily. Take with or immediately following a meal.   mupirocin cream 2 % Commonly known as:  BACTROBAN Apply 1 application topically 2 (two) times daily.   nitroGLYCERIN  0.4 MG SL tablet Commonly known as:  NITROSTAT Place 1 tablet (0.4 mg total) under the tongue every  5 (five) minutes as needed for chest pain.   omeprazole 40 MG capsule Commonly known as:  PRILOSEC TAKE 1 CAPSULE (40 MG TOTAL) BY MOUTH 2 (TWO) TIMES DAILY.   rOPINIRole 1 MG tablet Commonly known as:  REQUIP TAKE 1 TABLET (1 MG TOTAL) BY MOUTH 2 (TWO) TIMES DAILY.   sacubitril-valsartan 97-103 MG Commonly known as:  ENTRESTO Take 1 tablet by mouth 2 (two) times daily.   SYSTANE BALANCE OP Apply 1 drop to eye as needed (DRY EYES).   tiZANidine 4 MG tablet Commonly known as:  ZANAFLEX Take 4 mg by mouth 2 (two) times daily as needed for muscle spasms.   traZODone 50 MG tablet Commonly known as:  DESYREL Take 1-2 tablets (50-100 mg total) by mouth at bedtime.          Objective:   Physical Exam BP 132/68 (BP Location: Left Arm, Patient Position: Sitting, Cuff Size: Normal)   Pulse 66   Temp 98.1 F (36.7 C) (Oral)   Resp 16   Ht 5\' 6"  (1.676 m)   Wt 241 lb (109.3 kg)   SpO2 96%   BMI 38.90 kg/m  General:   Well developed, NAD, see BMI.  HEENT:  Normocephalic . Face symmetric, atraumatic  Skin:  She has a open sore between the toes, see picture, skin is a slightly dry , otherwise there is no rash, redness, swelling, tenderness. Neurologic:  alert & oriented X3.  Speech normal, gait appropriate for age and unassisted Psych--  Cognition and judgment appear intact.  Cooperative with normal attention span and concentration.  Behavior appropriate. No anxious or depressed appearing.        Assessment & Plan:   Assessment HTN Hyperlipidemia- Pravachol, Crestor: Myalgias. Intolerant to Zetia (joint aches), see OV  06/2016  Depression, Anxiety (on clonazepam), insomnia (on trazodone): depression x many  years, remotely on zoloft and other meds per psych, I rx lexapro 2015, then switch to effexor ; Morbid obesity CV: CHF/ non ischemic cardiomyopathy dx 04-2017;  cath normal coronaries, EF ~ 30% GI:  --GERD, Barrett's esophagus, EGD 05-2015 --h/o persistent C. difficile diarrhea 2014 B12 deficiency Osteopenia: Dexa 2015 showed osteopenia, T score -1.9 (10/2016): Rx cs, vit D RLS - Requip, see Pulmonary Migraines, f/u [facility] -- off topamax as off 06-2016 MSK: --Back pain, chronic: pain meds rx by ortho, Dr Eddie Dibbles retired, rx per Workers comp MD --DJD --CTS B OAB, LUTS-- on myrbetriq Dr McDarmoth  H/o Cardiac that confirmed the EF of 2011 and 2014 negative  PLAN Open sore: Open sore between the toes after she had a tick bite, will recommend Bactroban and hydrocortisone cream (has some dryness and redness around it).  Also doxycycline x5 days for possibly early skin  infection, doubt any tickborne diseases.  Raynaud phenomena: At the end of the visit, she also told me that her toes good purple  sometimes and   her fingers white on and off.  She showed me a picture of 1 of her fingers and it was indeed pale.  That happens sometimes on all her fingers and self resolved. Sx c/w Raynaud's phenomena, that was discussed with the patient, recommend to avoid cold exposure and use gloves in the winter.  Unclear why this has been trigger.  I check Entresto and Raynaud's is not listed as a side effect. We will call or seek medical attention if the symptoms are severe, not spontaneously resolving or they are more frequent.  See AVS  Today, I  spent more than 25   min with the patient: >50% of the time counseling regards appropriate wound care and also about the new diagnosis of Raynaud phenomena.  Multiple questions answered to the best of my ability.

## 2017-07-22 NOTE — Assessment & Plan Note (Signed)
Open sore: Open sore between the toes after she had a tick bite, will recommend Bactroban and hydrocortisone cream (has some dryness and redness around it).  Also doxycycline x5 days for possibly early skin  infection, doubt any tickborne diseases.  Raynaud phenomena: At the end of the visit, she also told me that her toes good purple  sometimes and   her fingers white on and off.  She showed me a picture of 1 of her fingers and it was indeed pale.  That happens sometimes on all her fingers and self resolved. Sx c/w Raynaud's phenomena, that was discussed with the patient, recommend to avoid cold exposure and use gloves in the winter.  Unclear why this has been trigger.  I check Entresto and Raynaud's is not listed as a side effect. We will call or seek medical attention if the symptoms are severe, not spontaneously resolving or they are more frequent.  See AVS

## 2017-08-01 DIAGNOSIS — E559 Vitamin D deficiency, unspecified: Secondary | ICD-10-CM | POA: Diagnosis not present

## 2017-08-01 DIAGNOSIS — M19072 Primary osteoarthritis, left ankle and foot: Secondary | ICD-10-CM | POA: Diagnosis not present

## 2017-08-01 DIAGNOSIS — M84374A Stress fracture, right foot, initial encounter for fracture: Secondary | ICD-10-CM | POA: Diagnosis not present

## 2017-08-05 ENCOUNTER — Ambulatory Visit (INDEPENDENT_AMBULATORY_CARE_PROVIDER_SITE_OTHER): Payer: Medicare Other | Admitting: Psychology

## 2017-08-05 ENCOUNTER — Other Ambulatory Visit: Payer: Self-pay | Admitting: Gastroenterology

## 2017-08-05 ENCOUNTER — Ambulatory Visit (INDEPENDENT_AMBULATORY_CARE_PROVIDER_SITE_OTHER): Payer: Medicare Other

## 2017-08-05 DIAGNOSIS — E538 Deficiency of other specified B group vitamins: Secondary | ICD-10-CM | POA: Diagnosis not present

## 2017-08-05 DIAGNOSIS — F332 Major depressive disorder, recurrent severe without psychotic features: Secondary | ICD-10-CM

## 2017-08-05 MED ORDER — CYANOCOBALAMIN 1000 MCG/ML IJ SOLN
1000.0000 ug | Freq: Once | INTRAMUSCULAR | Status: AC
  Administered 2017-08-05: 1000 ug via INTRAMUSCULAR

## 2017-08-05 NOTE — Progress Notes (Addendum)
Pre visit review using our clinic review tool, if applicable. No additional management support is needed unless otherwise documented below in the visit note.   Pt here for monthly B12 injection per Dr. Larose Kells PCP  B12 1039mcg given IM in right deltoid, and pt tolerated injection well.  Next B12 injection scheduled for 09/06/17 at 2:30 Kathlene November, MD

## 2017-08-08 DIAGNOSIS — E559 Vitamin D deficiency, unspecified: Secondary | ICD-10-CM | POA: Diagnosis not present

## 2017-08-08 DIAGNOSIS — M84374D Stress fracture, right foot, subsequent encounter for fracture with routine healing: Secondary | ICD-10-CM | POA: Diagnosis not present

## 2017-08-16 ENCOUNTER — Encounter: Payer: Self-pay | Admitting: Internal Medicine

## 2017-08-16 ENCOUNTER — Ambulatory Visit (INDEPENDENT_AMBULATORY_CARE_PROVIDER_SITE_OTHER): Payer: Medicare Other | Admitting: Internal Medicine

## 2017-08-16 ENCOUNTER — Ambulatory Visit: Payer: Medicare Other | Admitting: Psychology

## 2017-08-16 VITALS — BP 134/74 | HR 57 | Temp 97.9°F | Resp 16 | Ht 66.0 in | Wt 240.6 lb

## 2017-08-16 DIAGNOSIS — F329 Major depressive disorder, single episode, unspecified: Secondary | ICD-10-CM | POA: Diagnosis not present

## 2017-08-16 DIAGNOSIS — I5042 Chronic combined systolic (congestive) and diastolic (congestive) heart failure: Secondary | ICD-10-CM | POA: Diagnosis not present

## 2017-08-16 DIAGNOSIS — F419 Anxiety disorder, unspecified: Secondary | ICD-10-CM | POA: Diagnosis not present

## 2017-08-16 DIAGNOSIS — F32A Depression, unspecified: Secondary | ICD-10-CM

## 2017-08-16 NOTE — Patient Instructions (Addendum)
  GO TO THE FRONT DESK Schedule your next appointment for a  Check up 5-6 months   Use the carpal tunnel braces every night, call if not better   Take OTC VIT D 1000 units a day

## 2017-08-16 NOTE — Progress Notes (Signed)
Pre visit review using our clinic review tool, if applicable. No additional management support is needed unless otherwise documented below in the visit note. 

## 2017-08-16 NOTE — Assessment & Plan Note (Signed)
CHF: Seems to be stable, last BMP satisfactory, continue Lasix, metoprolol, Entresto. Depression, anxiety, insomnia: Currently on clonazepam and desyrel.  Sxs controlled. Right foot fracture, dx recently by podiatry, they also told her she has low vitamin D.  Recommend to follow-up with them and take vitamin D supplements. Vitamin D deficiency: See above Back pain: Follow-up by [facility] doctors in [place].  On Norco, advised not to mix it with Tylenol ( which she was doing). CTS: Follow-up by [facility] orthopedic, still has symptoms, currently not using a brace.  Rx consistent use of a brace, if not better she will call, she would like to be referred to a different orthopedic group. Dyspepsia, GERD.  Essentially asymptomatic.  On Prilosec. Open sore: See last visit, resolved RTC 5 to 6 months

## 2017-08-16 NOTE — Progress Notes (Signed)
Subjective:    Patient ID: [name], female    DOB: 03-04-1945, 72 y.o.   MRN: [id number]  DOS:  08/16/2017 Type of visit - description : Follow-up Interval history: Since the last visit, she is doing okay. Went to a podiatrist, diagnosed with a foot fracture, wearing a boot. He also did some blood work and she was diagnosed with low vitamin D. She takes hydrocodone and sometimes acetaminophen. History of CTX, still having symptoms.  Review of Systems  At this point denies dyspepsia, dysphasia or heartburn.  Past Medical History:  Diagnosis Date  . Anemia   . Anxiety   . Barrett's esophagus   . Bilateral carpal tunnel syndrome    Dr. Eddie Dibbles, having injection therapy  . C. difficile diarrhea 08/01/2012   severe 2014  . Chronic cystitis    Dr. Matilde Sprang  . Chronic lower back pain   . Depression   . DJD (degenerative joint disease)    bilateral hands; knees  . Family history of anesthesia complication    Mother had severe N/V  . GERD (gastroesophageal reflux disease)   . H/O cardiac catheterization    (-) cath 12-2002  , cath again 2011 (-)  . HTN (hypertension)   . Hyperlipidemia   . Insomnia   . Migraine   . Osteoporosis    pt unsure of this  . RLS (restless legs syndrome)   . Vitamin B 12 deficiency 04/09/2013    Past Surgical History:  Procedure Laterality Date  . Arm surgery Left    "don't remember what they did; arm wasn't broken"  . BILATERAL KNEE ARTHROSCOPY Bilateral   . CARDIAC CATHETERIZATION  01/22/10   clean cath  . CATARACT EXTRACTION W/ INTRAOCULAR LENS  IMPLANT, BILATERAL Bilateral   . CHOLECYSTECTOMY N/A 10/25/2016   Procedure: LAPAROSCOPIC CHOLECYSTECTOMY;  Surgeon: Georganna Skeans, MD;  Location: [facility];  Service: General;  Laterality: N/A;  . COLONOSCOPY W/ POLYPECTOMY    . FOOT SURGERY Bilateral    toenails removed; callus removed on right; hammertoes right"  . Knot     "removed from right neck; not a goiter"  . LUMBAR DISC SURGERY     L5 S1 anterior fusion  . OOPHORECTOMY    . RIGHT/LEFT HEART CATH AND CORONARY ANGIOGRAPHY N/A 05/06/2017   Procedure: RIGHT/LEFT HEART CATH AND CORONARY ANGIOGRAPHY;  Surgeon: Nelva Bush, MD;  Location: [facility];  Service: Cardiovascular;  Laterality: N/A;  . SHOULDER ARTHROSCOPY Right   . TOTAL KNEE ARTHROPLASTY  10/05/2011   Procedure: TOTAL KNEE ARTHROPLASTY;  Surgeon: Hessie Dibble, MD;  Location: [facility];  Service: Orthopedics;  Laterality: Right;  [name] VAGINAL HYSTERECTOMY     for endometriosis    Social History   Socioeconomic History  . Marital status: Divorced    Spouse name: Not on file  . Number of children: 1  . Years of education: Not on file  . Highest education level: Not on file  Occupational History  . Occupation: Retired, post Ecologist: RETIRED  Social Needs  . Financial resource strain: Not on file  . Food insecurity:    Worry: Not on file    Inability: Not on file  . Transportation needs:    Medical: Not on file    Non-medical: Not on file  Tobacco Use  . Smoking status: Former Smoker    Packs/day: 0.50    Years: 10.00    Pack years: 5.00    Last  attempt to quit: 02/09/1975    Years since quitting: 42.5  . Smokeless tobacco: Never Used  . Tobacco comment: started at age 11.   quit in the 38s.  Substance and Sexual Activity  . Alcohol use: Yes    Alcohol/week: 0.0 oz    Comment: occ  . Drug use: No    Comment: CBD and hemp OIL   . Sexual activity: Not Currently    Partners: Male  Lifestyle  . Physical activity:    Days per week: Not on file    Minutes per session: Not on file  . Stress: Not on file  Relationships  . Social connections:    Talks on phone: Not on file    Gets together: Not on file    Attends religious service: Not on file    Active member of club or organization: Not on file    Attends meetings of clubs or organizations: Not on file    Relationship status: Not on file  . Intimate partner violence:     Fear of current or ex partner: Not on file    Emotionally abused: Not on file    Physically abused: Not on file    Forced sexual activity: Not on file  Other Topics Concern  . Not on file  Social History Narrative   Son lives w/ her       Allergies as of 08/16/2017      Reactions   Dextromethorphan-guaifenesin Nausea And Vomiting   Morphine Nausea And Vomiting   Penicillins Rash   "> 30 years ago; best I can remember it was just a light rash on my arm"      Medication List        Accurate as of 08/16/17  5:43 PM. Always use your most recent med list.          acetaminophen 500 MG tablet Commonly known as:  TYLENOL Take 1,000 mg by mouth every 6 (six) hours as needed for mild pain or headache.   aspirin EC 81 MG tablet Take 81 mg by mouth daily.   azelastine 0.1 % nasal spray Commonly known as:  ASTELIN Place 2 sprays into both nostrils 2 (two) times daily.   clonazePAM 0.5 MG tablet Commonly known as:  KLONOPIN Take 1 tablet (0.5 mg total) by mouth 3 (three) times daily as needed for anxiety.   furosemide 20 MG tablet Commonly known as:  LASIX Take 1 tablet (20 mg total) by mouth daily.   HYDROcodone-acetaminophen 10-325 MG tablet Commonly known as:  NORCO Take 1 tablet by mouth every 4 (four) hours as needed for moderate pain or severe pain. For back pain   hydrocortisone 2.5 % cream Apply topically 2 (two) times daily.   ICY HOT ADVANCED RELIEF 16-11 % Crea Generic drug:  Menthol-Camphor Apply 1 application topically as needed (SHOULDER PAIN).   metoprolol succinate 50 MG 24 hr tablet Commonly known as:  TOPROL-XL Take 1 tablet (50 mg total) by mouth daily. Take with or immediately following a meal.   mupirocin cream 2 % Commonly known as:  BACTROBAN Apply 1 application topically 2 (two) times daily.   nitroGLYCERIN 0.4 MG SL tablet Commonly known as:  NITROSTAT Place 1 tablet (0.4 mg total) under the tongue every 5 (five) minutes as needed for chest  pain.   omeprazole 40 MG capsule Commonly known as:  PRILOSEC TAKE 1 CAPSULE (40 MG TOTAL) BY MOUTH 2 (TWO) TIMES DAILY.   rOPINIRole 1 MG  tablet Commonly known as:  REQUIP TAKE 1 TABLET (1 MG TOTAL) BY MOUTH 2 (TWO) TIMES DAILY.   sacubitril-valsartan 97-103 MG Commonly known as:  ENTRESTO Take 1 tablet by mouth 2 (two) times daily.   SYSTANE BALANCE OP Apply 1 drop to eye as needed (DRY EYES).   traZODone 50 MG tablet Commonly known as:  DESYREL Take 1-2 tablets (50-100 mg total) by mouth at bedtime.          Objective:   Physical Exam BP 134/74 (BP Location: Left Arm, Patient Position: Sitting, Cuff Size: Small)   Pulse (!) 57   Temp 97.9 F (36.6 C) (Oral)   Resp 16   Ht 5\' 6"  (1.676 m)   Wt 240 lb 9 oz (109.1 kg)   SpO2 96%   BMI 38.83 kg/m  General:   Well developed, NAD, see BMI.  HEENT:  Normocephalic . Face symmetric, atraumatic Lungs:  CTA B Normal respiratory effort, no intercostal retractions, no accessory muscle use. Heart: RRR,  no murmur.  No pretibial edema bilaterally  Skin: See last visit, she had a open sore between the right great toe on the second toe.  Resolved.   Neurologic:  alert & oriented X3.  Speech normal, gait limited, using a boot at the right leg Psych--  Cognition and judgment appear intact.  Cooperative with normal attention span and concentration.  Behavior appropriate. No anxious or depressed appearing.      Assessment & Plan:  Assessment HTN Hyperlipidemia-  Pravachol, Crestor: Myalgias. Intolerant to Zetia (joint aches), see OV  06/2016  Depression, Anxiety (on clonazepam), insomnia (on trazodone):  depression x many  years, remotely on zoloft and other meds per psych, I rx lexapro 2015, then was rx effexor x a while Morbid obesity CV: CHF/ non ischemic cardiomyopathy dx 04-2017; cath normal coronaries, EF ~ 30% GI:  --GERD, Barrett's esophagus, EGD 05-2015, next per GI --h/o persistent C. 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 Depression, anxiety, insomnia: Currently on clonazepam and desyrel.  Sxs controlled. Right foot fracture, dx recently by podiatry, they also told her she has low vitamin D.  Recommend to follow-up with them and take vitamin D supplements. Vitamin D deficiency: See above Back pain: Follow-up by [facility] doctors in [place].  On Norco, advised not to mix it with Tylenol ( which she was doing). CTS: Follow-up by [facility] orthopedic, still has symptoms, currently not using a brace.  Rx consistent use of a brace, if not better she will call, she would like to be referred to a different orthopedic group. Dyspepsia, GERD.  Essentially asymptomatic.  On Prilosec. Open sore: See last visit, resolved RTC 5 to 6 months

## 2017-08-18 ENCOUNTER — Ambulatory Visit (INDEPENDENT_AMBULATORY_CARE_PROVIDER_SITE_OTHER): Payer: Medicare Other | Admitting: Psychology

## 2017-08-18 DIAGNOSIS — F332 Major depressive disorder, recurrent severe without psychotic features: Secondary | ICD-10-CM

## 2017-08-24 DIAGNOSIS — M84374D Stress fracture, right foot, subsequent encounter for fracture with routine healing: Secondary | ICD-10-CM | POA: Diagnosis not present

## 2017-08-25 ENCOUNTER — Encounter: Payer: Self-pay | Admitting: Cardiology

## 2017-08-25 ENCOUNTER — Ambulatory Visit (INDEPENDENT_AMBULATORY_CARE_PROVIDER_SITE_OTHER): Payer: Medicare Other | Admitting: Cardiology

## 2017-08-25 VITALS — BP 100/68 | HR 69 | Ht 66.0 in | Wt 238.2 lb

## 2017-08-25 DIAGNOSIS — E782 Mixed hyperlipidemia: Secondary | ICD-10-CM

## 2017-08-25 DIAGNOSIS — I5042 Chronic combined systolic (congestive) and diastolic (congestive) heart failure: Secondary | ICD-10-CM | POA: Diagnosis not present

## 2017-08-25 DIAGNOSIS — I428 Other cardiomyopathies: Secondary | ICD-10-CM

## 2017-08-25 DIAGNOSIS — I10 Essential (primary) hypertension: Secondary | ICD-10-CM | POA: Diagnosis not present

## 2017-08-25 MED ORDER — FUROSEMIDE 20 MG PO TABS: 20.0000 mg | ORAL_TABLET | ORAL | 3 refills | Status: DC | PRN

## 2017-08-25 NOTE — Patient Instructions (Addendum)
Medication Instructions:  Please take your Furosemide on an as needed basis (for weight gain overnight of 3 lbs or 5 lbs in a week).  You do not need to take it daily. Continue all other medications as listed.  Follow-Up: Follow up in 3 months with Alexa Barrios, [name].  Follow up in 6 months with [name].  You will receive a letter in the mail 2 months before you are due.  Please call us when you receive this letter to schedule your follow up appointment.   Any Other Special Instructions Will Be Listed Below (If Applicable). Please check your blood pressure daily and keep a record of it.  If you need a refill on your cardiac medications before your next appointment, please call your pharmacy.  Thank you for choosing [facility]!!

## 2017-08-25 NOTE — Progress Notes (Signed)
Cardiology Office Note:    Date:  08/25/2017   ID:  [name], DOB May 21, 1945, MRN [id number]  PCP:  Colon Branch, MD  Cardiologist:  Candee Furbish, MD   Referring MD: Colon Branch, MD     History of Present Illness:    [name] is a 72 y.o. female with systolic/diastolic heart failure EF as low as 30 to 35%, nonischemic with cardiac catheterization on 05/06/2017 showing no CAD here for follow-up.  [name] was started, Lasix was decreased.  Nutritionist referral was made but insurance would not pay for weight loss center.  Occasional edema especially pedal.  08/25/2017- dizziness is now occurring.  Blood pressure is lower.  We are stopping her Lasix 20.  [name] has been titrated.  No chest pain.  Occasionally she has been feeling migraines as well.  No bleeding.  No syncope.  She is still having some orthopnea.  Past Medical History:  Diagnosis Date  . Anemia   . Anxiety   . Barrett's esophagus   . Bilateral carpal tunnel syndrome    Dr. Eddie Dibbles, having injection therapy  . C. difficile diarrhea 08/01/2012   severe 2014  . Chronic cystitis    Dr. Matilde Sprang  . Chronic lower back pain   . Depression   . DJD (degenerative joint disease)    bilateral hands; knees  . Family history of anesthesia complication    Mother had severe N/V  . GERD (gastroesophageal reflux disease)   . H/O cardiac catheterization    (-) cath 12-2002  , cath again 2011 (-)  . HTN (hypertension)   . Hyperlipidemia   . Insomnia   . Migraine   . Osteoporosis    pt unsure of this  . RLS (restless legs syndrome)   . Vitamin B 12 deficiency 04/09/2013    Past Surgical History:  Procedure Laterality Date  . Arm surgery Left    "don't remember what they did; arm wasn't broken"  . BILATERAL KNEE ARTHROSCOPY Bilateral   . CARDIAC CATHETERIZATION  01/22/10   clean cath  . CATARACT EXTRACTION W/ INTRAOCULAR LENS  IMPLANT, BILATERAL Bilateral   . CHOLECYSTECTOMY N/A 10/25/2016   Procedure:  LAPAROSCOPIC CHOLECYSTECTOMY;  Surgeon: Georganna Skeans, MD;  Location: [facility];  Service: General;  Laterality: N/A;  . COLONOSCOPY W/ POLYPECTOMY    . FOOT SURGERY Bilateral    toenails removed; callus removed on right; hammertoes right"  . Knot     "removed from right neck; not a goiter"  . LUMBAR DISC SURGERY     L5 S1 anterior fusion  . OOPHORECTOMY    . RIGHT/LEFT HEART CATH AND CORONARY ANGIOGRAPHY N/A 05/06/2017   Procedure: RIGHT/LEFT HEART CATH AND CORONARY ANGIOGRAPHY;  Surgeon: Nelva Bush, MD;  Location: [facility];  Service: Cardiovascular;  Laterality: N/A;  . SHOULDER ARTHROSCOPY Right   . TOTAL KNEE ARTHROPLASTY  10/05/2011   Procedure: TOTAL KNEE ARTHROPLASTY;  Surgeon: Hessie Dibble, MD;  Location: [facility];  Service: Orthopedics;  Laterality: Right;  [name] VAGINAL HYSTERECTOMY     for endometriosis    Current Medications: Current Meds  Medication Sig  . acetaminophen (TYLENOL) 500 MG tablet Take 1,000 mg by mouth every 6 (six) hours as needed for mild pain or headache.  [name] aspirin EC 81 MG tablet Take 81 mg by mouth daily.  [name] azelastine (ASTELIN) 0.1 % nasal spray Place 2 sprays into both nostrils 2 (two) times daily.  . cholecalciferol (VITAMIN D) 1000  units tablet Take 1,000 Units by mouth daily.  . clonazePAM (KLONOPIN) 0.5 MG tablet Take 1 tablet (0.5 mg total) by mouth 3 (three) times daily as needed for anxiety.  . Cyanocobalamin 1000 MCG/ML KIT Inject 1,000 mcg as directed. Inject 1000 mcg into the skin once a month for vitamin b deficiency.  . furosemide (LASIX) 20 MG tablet Take 1 tablet (20 mg total) by mouth as needed.  [name] HYDROcodone-acetaminophen (NORCO) 10-325 MG tablet Take 1 tablet by mouth every 4 (four) hours as needed for moderate pain or severe pain. For back pain  . hydrocortisone 2.5 % cream Apply topically 2 (two) times daily.  . Menthol-Camphor (ICY HOT ADVANCED RELIEF) 16-11 % CREA Apply 1 application topically as needed (SHOULDER PAIN).    [name] metoprolol succinate (TOPROL-XL) 50 MG 24 hr tablet Take 1 tablet (50 mg total) by mouth daily. Take with or immediately following a meal.  . mupirocin cream (BACTROBAN) 2 % Apply 1 application topically 2 (two) times daily.  . nitroGLYCERIN (NITROSTAT) 0.4 MG SL tablet Place 1 tablet (0.4 mg total) under the tongue every 5 (five) minutes as needed for chest pain.  [name] omeprazole (PRILOSEC) 40 MG capsule TAKE 1 CAPSULE (40 MG TOTAL) BY MOUTH 2 (TWO) TIMES DAILY.  [name] Propylene Glycol (SYSTANE BALANCE OP) Apply 1 drop to eye as needed (DRY EYES).  [name] rOPINIRole (REQUIP) 1 MG tablet TAKE 1 TABLET (1 MG TOTAL) BY MOUTH 2 (TWO) TIMES DAILY.  . sacubitril-valsartan (ENTRESTO) 97-103 MG Take 1 tablet by mouth 2 (two) times daily.  . traZODone (DESYREL) 50 MG tablet Take 1-2 tablets (50-100 mg total) by mouth at bedtime.  . [DISCONTINUED] furosemide (LASIX) 20 MG tablet Take 1 tablet (20 mg total) by mouth daily.     Allergies:   Dextromethorphan-guaifenesin; Morphine; and Penicillins   Social History   Socioeconomic History  . Marital status: Divorced    Spouse name: Not on file  . Number of children: 1  . Years of education: Not on file  . Highest education level: Not on file  Occupational History  . Occupation: Retired, post Ecologist: RETIRED  Social Needs  . Financial resource strain: Not on file  . Food insecurity:    Worry: Not on file    Inability: Not on file  . Transportation needs:    Medical: Not on file    Non-medical: Not on file  Tobacco Use  . Smoking status: Former Smoker    Packs/day: 0.50    Years: 10.00    Pack years: 5.00    Last attempt to quit: 02/09/1975    Years since quitting: 42.5  . Smokeless tobacco: Never Used  . Tobacco comment: started at age 67.   quit in the 49s.  Substance and Sexual Activity  . Alcohol use: Yes    Alcohol/week: 0.0 oz    Comment: occ  . Drug use: No    Comment: CBD and hemp OIL   . Sexual activity: Not Currently     Partners: Male  Lifestyle  . Physical activity:    Days per week: Not on file    Minutes per session: Not on file  . Stress: Not on file  Relationships  . Social connections:    Talks on phone: Not on file    Gets together: Not on file    Attends religious service: Not on file    Active member of club or organization: Not on file  Attends meetings of clubs or organizations: Not on file    Relationship status: Not on file  Other Topics Concern  . Not on file  Social History Narrative   Son lives w/ her      Family History: The patient's family history includes Breast cancer in her sister; CAD in her father; Colon cancer in her maternal grandfather and maternal uncle; Dementia in her father; Diabetes in her other; Esophageal cancer in her brother; Lung cancer in her mother; Stroke in her father. There is no history of Rectal cancer or Stomach cancer.  ROS:   Please see the history of present illness.     All other systems reviewed and are negative.  EKGs/Labs/Other Studies Reviewed:    The following studies were reviewed today: Prior echocardiogram office notes EKG  ECHO 05/06/17: Compared to a prior echo in 2011, LVEF is lower at 40-45% with predominant inferior and inferoseptal hypokinesis.  Cardiac cath 05/06/2017 Conclusions: 1. No angiographically significant coronary artery disease. 2. Moderately to severely reduced left ventricular contraction (LVEF 30-35%), consistent with non-ischemic cardiomyopathy. 3. Upper normal to mildly elevated left heart, right heart, and pulmonary artery pressures. 4. Low normal to mildly decreased cardiac output/index.  Recommendations: 1. Optimize evidence-based heart failure therapy. 2. Primary prevention of coronary artery disease.  Nelva Bush, MD   EKG:  None today  Recent Labs: 05/05/2017: B Natriuretic Peptide 112.5 05/08/2017: Magnesium 1.8 05/10/2017: ALT 18; Hemoglobin 13.0; Platelets 258.0 07/12/2017: BUN 15;  Creatinine, Ser 0.98; Potassium 4.4; Sodium 142  Recent Lipid Panel    Component Value Date/Time   CHOL 148 10/30/2015 1102   TRIG 68.0 10/30/2015 1102   HDL 60.50 10/30/2015 1102   CHOLHDL 2 10/30/2015 1102   VLDL 13.6 10/30/2015 1102   LDLCALC 74 10/30/2015 1102   LDLDIRECT 166.0 04/14/2010 1539    Physical Exam:    VS:  BP 100/68   Pulse 69   Ht '5\' 6"'  (1.676 m)   Wt 238 lb 3.2 oz (108 kg)   SpO2 98%   BMI 38.45 kg/m     Wt Readings from Last 3 Encounters:  08/25/17 238 lb 3.2 oz (108 kg)  08/16/17 240 lb 9 oz (109.1 kg)  07/22/17 241 lb (109.3 kg)     GEN:  Well nourished, well developed in no acute distress HEENT: Normal NECK: No JVD; No carotid bruits LYMPHATICS: No lymphadenopathy CARDIAC: RRR, no murmurs, rubs, gallops RESPIRATORY:  Clear to auscultation without rales, wheezing or rhonchi  ABDOMEN: Soft, non-tender, non-distended MUSCULOSKELETAL:  No edema; No deformity  SKIN: Warm and dry NEUROLOGIC:  Alert and oriented x 3 PSYCHIATRIC:  Normal affect   ASSESSMENT:    1. Chronic combined systolic and diastolic CHF (congestive heart failure) ([place])   2. Morbid obesity ([place])   3. Mixed hyperlipidemia   4. Nonischemic cardiomyopathy ([place])   5. Essential hypertension    PLAN:    In order of problems listed above:  Chronic combined systolic and diastolic heart failure/nonischemic cardiomyopathy - EF approximately 30% on cath, 40% on echocardiogram.  [name] has been increased.  Overall doing fairly well, NYHA class II.  Creatinine 0.98, potassium 4.4. She has over the past day or 2 felt more dizziness, holding onto the walls because of dizziness.  She is on higher dose Entresto as well as Lasix 20 mg once a day.  I will stop her Lasix.  Her blood pressure 1 week ago was in the 993 systolic, currently 570.  Hopefully she will  feel better with slightly increased blood pressure.  Of course of her weight increases by 3 pounds, she may take her Lasix 20 mg as  needed. -She is still feeling some symptoms of orthopnea.  Let us continue to monitor. -If dizziness does not improve or blood pressure does not improve, we should consider decreasing her Entresto dose. - Interestingly, we did orthostatics on her and she did not demonstrate any evidence of orthostatics but the room did spin when she went from lying to sitting.  I think there may be a component of vertigo involved.  She is also felt some pounding headaches.  She has had ringing in her ears for several years and was told this was secondary to her occupation. I asked her to give Dr. Larose Kells a call potentially for an evaluation.  I wonder if she needs an CT of head?  Essential hypertension -Currently well controlled.  In fact, low today.  Dizziness associated with this.  Medications as above.  Morbid obesity - At prior visit, suggested weight watchers because of lack of insurance pay for nutrition counseling.  Foot fracture -Podiatry.  Medication Adjustments/Labs and Tests Ordered: Current medicines are reviewed at length with the patient today.  Concerns regarding medicines are outlined above.  No orders of the defined types were placed in this encounter.  Meds ordered this encounter  Medications  . furosemide (LASIX) 20 MG tablet    Sig: Take 1 tablet (20 mg total) by mouth as needed.    Dispense:  90 tablet    Refill:  3    Patient Instructions  Medication Instructions:  Please take your Furosemide on an as needed basis (for weight gain overnight of 3 lbs or 5 lbs in a week).  You do not need to take it daily. Continue all other medications as listed.  Follow-Up: Follow up in 3 months with Ermalinda Barrios, PA.  Follow up in 6 months with Dr. Marlou Porch.  You will receive a letter in the mail 2 months before you are due.  Please call us when you receive this letter to schedule your follow up appointment.   Any Other Special Instructions Will Be Listed Below (If Applicable). Please check  your blood pressure daily and keep a record of it.  If you need a refill on your cardiac medications before your next appointment, please call your pharmacy.  Thank you for choosing [facility]!!        Signed, Candee Furbish, MD  08/25/2017 2:47 PM    [place]

## 2017-08-29 ENCOUNTER — Telehealth: Payer: Self-pay

## 2017-08-29 NOTE — Telephone Encounter (Signed)
-----   Message from [place], [place] sent at 08/29/2017  7:38 AM EDT ----- Regarding: FW: needs OV   ----- Message ----- From: Colon Branch, MD Sent: 08/26/2017   7:05 PM To: Damita Dunnings, CMA Subject: needs OV                                       See cards note, open a phone note, needs to be seen due to vertigo, ask Raquel Sarna to triage pt and decide if I can see her in 10 days or needs to be seen next week

## 2017-08-29 NOTE — Telephone Encounter (Signed)
Author phoned pt. To follow-up on dizziness/low BP from cardiology visit per Dr. Ethel Rana request. No answer, author left VM to call back to [phone] with potential to offer earlier appointment with Dr. Larose Kells if needed.

## 2017-09-05 DIAGNOSIS — M84374D Stress fracture, right foot, subsequent encounter for fracture with routine healing: Secondary | ICD-10-CM | POA: Diagnosis not present

## 2017-09-05 NOTE — Telephone Encounter (Signed)
Pt has appt 09/06/2017.

## 2017-09-06 ENCOUNTER — Encounter: Payer: Self-pay | Admitting: Internal Medicine

## 2017-09-06 ENCOUNTER — Ambulatory Visit: Payer: Medicare Other

## 2017-09-06 ENCOUNTER — Ambulatory Visit (INDEPENDENT_AMBULATORY_CARE_PROVIDER_SITE_OTHER): Payer: Medicare Other | Admitting: Internal Medicine

## 2017-09-06 VITALS — BP 126/74 | HR 79 | Temp 97.6°F | Resp 14 | Ht 66.0 in | Wt 239.4 lb

## 2017-09-06 DIAGNOSIS — H814 Vertigo of central origin: Secondary | ICD-10-CM

## 2017-09-06 DIAGNOSIS — G4452 New daily persistent headache (NDPH): Secondary | ICD-10-CM

## 2017-09-06 DIAGNOSIS — E538 Deficiency of other specified B group vitamins: Secondary | ICD-10-CM

## 2017-09-06 DIAGNOSIS — R42 Dizziness and giddiness: Secondary | ICD-10-CM

## 2017-09-06 DIAGNOSIS — H8149 Vertigo of central origin, unspecified ear: Secondary | ICD-10-CM | POA: Diagnosis not present

## 2017-09-06 MED ORDER — CYANOCOBALAMIN 1000 MCG/ML IJ SOLN
1000.0000 ug | Freq: Once | INTRAMUSCULAR | Status: AC
  Administered 2017-09-06: 1000 ug via INTRAMUSCULAR

## 2017-09-06 NOTE — Progress Notes (Signed)
Subjective:    Patient ID: [name], female    DOB: [birth year], 72 y.o.   MRN: [id number]  DOS:  09/06/2017 Type of visit - description : acute Interval history: Was seen by cardiology 08/25/2017, she reported dizziness at the time, "holding onto the walls",  BP was low, Lasix was changed to prn She also had some headache and was recommended to see primary physician. She reports the following history:  Having dizziness and headache for 2 months. Dizziness is triggering by standing, moving or moving her head, it is intense, "like the room starts moving", sometimes associated with nausea. The dizziness has been intense enough that she has to hold onto the walls and actually had a couple of falls.   She used to have migraines but was asymptomatic for years.  The headache started again 2 months ago, they are global, daily, may last few hours, is different than previous migraines years ago and has been the worst of her life sometimes. When she has the headache, there is associated phono and photophobia. Unclear if she has a stiff neck.  Symptoms do not seem to be associated to each other   BP Readings from Last 3 Encounters:  09/06/17 126/74  08/25/17 100/68  08/16/17 134/74    Review of Systems Denies any fever chills.  No recent weight loss. No amaurosis fugax or visual disturbances. Denies of slurred speech, facial numbness or motor deficits.   Past Medical History:  Diagnosis Date  . Anemia   . Anxiety   . Barrett's esophagus   . Bilateral carpal tunnel syndrome    Dr. Eddie Dibbles, having injection therapy  . C. difficile diarrhea 08/01/2012   severe 2014  . Chronic cystitis    Dr. Matilde Sprang  . Chronic lower back pain   . Depression   . DJD (degenerative joint disease)    bilateral hands; knees  . Family history of anesthesia complication    Mother had severe N/V  . GERD (gastroesophageal reflux disease)   . H/O cardiac catheterization    (-) cath 12-2002  , cath  again 2011 (-)  . HTN (hypertension)   . Hyperlipidemia   . Insomnia   . Migraine   . Osteoporosis    pt unsure of this  . RLS (restless legs syndrome)   . Vitamin B 12 deficiency 04/09/2013    Past Surgical History:  Procedure Laterality Date  . Arm surgery Left    "don't remember what they did; arm wasn't broken"  . BILATERAL KNEE ARTHROSCOPY Bilateral   . CARDIAC CATHETERIZATION  01/22/10   clean cath  . CATARACT EXTRACTION W/ INTRAOCULAR LENS  IMPLANT, BILATERAL Bilateral   . CHOLECYSTECTOMY N/A 10/25/2016   Procedure: LAPAROSCOPIC CHOLECYSTECTOMY;  Surgeon: Georganna Skeans, MD;  Location: [facility];  Service: General;  Laterality: N/A;  . COLONOSCOPY W/ POLYPECTOMY    . FOOT SURGERY Bilateral    toenails removed; callus removed on right; hammertoes right"  . Knot     "removed from right neck; not a goiter"  . LUMBAR DISC SURGERY     L5 S1 anterior fusion  . OOPHORECTOMY    . RIGHT/LEFT HEART CATH AND CORONARY ANGIOGRAPHY N/A 05/06/2017   Procedure: RIGHT/LEFT HEART CATH AND CORONARY ANGIOGRAPHY;  Surgeon: Nelva Bush, MD;  Location: [facility];  Service: Cardiovascular;  Laterality: N/A;  . SHOULDER ARTHROSCOPY Right   . TOTAL KNEE ARTHROPLASTY  10/05/2011   Procedure: TOTAL KNEE ARTHROPLASTY;  Surgeon: Hessie Dibble, MD;  Location: [facility];  Service: Orthopedics;  Laterality: Right;  [name] VAGINAL HYSTERECTOMY     for endometriosis    Social History   Socioeconomic History  . Marital status: Divorced    Spouse name: Not on file  . Number of children: 1  . Years of education: Not on file  . Highest education level: Not on file  Occupational History  . Occupation: Retired, post Ecologist: RETIRED  Social Needs  . Financial resource strain: Not on file  . Food insecurity:    Worry: Not on file    Inability: Not on file  . Transportation needs:    Medical: Not on file    Non-medical: Not on file  Tobacco Use  . Smoking status: Former Smoker     Packs/day: 0.50    Years: 10.00    Pack years: 5.00    Last attempt to quit: 02/09/1975    Years since quitting: 42.6  . Smokeless tobacco: Never Used  . Tobacco comment: started at age 75.   quit in the 48s.  Substance and Sexual Activity  . Alcohol use: Yes    Alcohol/week: 0.0 oz    Comment: occ  . Drug use: No    Comment: CBD and hemp OIL   . Sexual activity: Not Currently    Partners: Male  Lifestyle  . Physical activity:    Days per week: Not on file    Minutes per session: Not on file  . Stress: Not on file  Relationships  . Social connections:    Talks on phone: Not on file    Gets together: Not on file    Attends religious service: Not on file    Active member of club or organization: Not on file    Attends meetings of clubs or organizations: Not on file    Relationship status: Not on file  . Intimate partner violence:    Fear of current or ex partner: Not on file    Emotionally abused: Not on file    Physically abused: Not on file    Forced sexual activity: Not on file  Other Topics Concern  . Not on file  Social History Narrative   Son lives w/ her       Allergies as of 09/06/2017      Reactions   Dextromethorphan-guaifenesin Nausea And Vomiting   Morphine Nausea And Vomiting   Penicillins Rash   "> 30 years ago; best I can remember it was just a light rash on my arm"      Medication List        Accurate as of 09/06/17 11:59 PM. Always use your most recent med list.          acetaminophen 500 MG tablet Commonly known as:  TYLENOL Take 1,000 mg by mouth every 6 (six) hours as needed for mild pain or headache.   aspirin EC 81 MG tablet Take 81 mg by mouth daily.   azelastine 0.1 % nasal spray Commonly known as:  ASTELIN Place 2 sprays into both nostrils 2 (two) times daily.   cholecalciferol 1000 units tablet Commonly known as:  VITAMIN D Take 1,000 Units by mouth daily.   clonazePAM 0.5 MG tablet Commonly known as:  KLONOPIN Take 1  tablet (0.5 mg total) by mouth 3 (three) times daily as needed for anxiety.   Cyanocobalamin 1000 MCG/ML Kit Inject 1,000 mcg as directed. Inject 1000 mcg into the skin once a month  for vitamin b deficiency.   furosemide 20 MG tablet Commonly known as:  LASIX Take 1 tablet (20 mg total) by mouth as needed.   HYDROcodone-acetaminophen 10-325 MG tablet Commonly known as:  NORCO Take 1 tablet by mouth every 4 (four) hours as needed for moderate pain or severe pain. For back pain   hydrocortisone 2.5 % cream Apply topically 2 (two) times daily.   ICY HOT ADVANCED RELIEF 16-11 % Crea Generic drug:  Menthol-Camphor Apply 1 application topically as needed (SHOULDER PAIN).   metoprolol succinate 50 MG 24 hr tablet Commonly known as:  TOPROL-XL Take 1 tablet (50 mg total) by mouth daily. Take with or immediately following a meal.   mupirocin cream 2 % Commonly known as:  BACTROBAN Apply 1 application topically 2 (two) times daily.   nitroGLYCERIN 0.4 MG SL tablet Commonly known as:  NITROSTAT Place 1 tablet (0.4 mg total) under the tongue every 5 (five) minutes as needed for chest pain.   omeprazole 40 MG capsule Commonly known as:  PRILOSEC TAKE 1 CAPSULE (40 MG TOTAL) BY MOUTH 2 (TWO) TIMES DAILY.   rOPINIRole 1 MG tablet Commonly known as:  REQUIP TAKE 1 TABLET (1 MG TOTAL) BY MOUTH 2 (TWO) TIMES DAILY.   sacubitril-valsartan 97-103 MG Commonly known as:  ENTRESTO Take 1 tablet by mouth 2 (two) times daily.   SYSTANE BALANCE OP Apply 1 drop to eye as needed (DRY EYES).   traZODone 50 MG tablet Commonly known as:  DESYREL Take 1-2 tablets (50-100 mg total) by mouth at bedtime.          Objective:   Physical Exam BP 126/74 (BP Location: Left Arm, Patient Position: Sitting, Cuff Size: Normal)   Pulse 79   Temp 97.6 F (36.4 C) (Oral)   Resp 14   Ht '5\' 6"'  (1.676 m)   Wt 239 lb 6 oz (108.6 kg)   SpO2 96%   BMI 38.64 kg/m  General:   Well developed, NAD, see  BMI.  HEENT:  Normocephalic . Face symmetric, atraumatic. Temples: Good T. A.  pulses bilaterally, actually she is a slightly tender upon palpation on the temples in general, worse on the right. Neck: Normal carotid pulses. Lungs:  CTA B Normal respiratory effort, no intercostal retractions, no accessory muscle use. Heart: RRR,  no murmur.  No pretibial edema bilaterally  Skin: Not pale. Not jaundice Neurologic:  alert & oriented X3.  Speech normal, gait appropriate for age but I did notice some unsteadiness due to dizziness. EOMI.  Pupils equal and reactive Motor and DTRs symmetric Romberg: Hard to assess, she was dizzy standing standing with open or closed eyes. Psych--  Cognition and judgment appear intact.  Cooperative with normal attention span and concentration.  Behavior appropriate. No anxious or depressed appearing.      Assessment & Plan:   Assessment HTN Hyperlipidemia-  Pravachol, Crestor: Myalgias. Intolerant to Zetia (joint aches), see OV  06/2016  Depression, Anxiety (on clonazepam), insomnia (on trazodone):  depression x many  years, remotely on zoloft and other meds per psych, I rx lexapro 2015, then was rx effexor x a while Morbid obesity CV: CHF/ non ischemic cardiomyopathy dx 04-2017; cath normal coronaries, EF ~ 30% GI:  --GERD, Barrett's esophagus, EGD 05-2015, next per GI --h/o persistent C. 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 Dizziness have peripheral features, headache has  migraine features but they are different from previous migraines. Neurological exam is normal but there is some tenderness over the TA arteries. Plan:  CBC, BMP, sed rate.  Stat MRI brain. ER if symptoms increase Fall prevention discussed, rec a cane/walker (has at home) Refer to neurology Further advise with results.   CHF: Recently BP was elevated, Lasix on hold, BP today is good but dizziness persist thus likely unrelated. B12 deficiency: Injection today RTC 10 days.  I will be out of town but by one of my partners will see her.  Patient is aware

## 2017-09-06 NOTE — Progress Notes (Signed)
Pre visit review using our clinic review tool, if applicable. No additional management support is needed unless otherwise documented below in the visit note. 

## 2017-09-06 NOTE — Patient Instructions (Addendum)
GO TO THE LAB : Get the blood work     GO TO THE FRONT DESK Schedule your next appointment for a checkup in 10 days     MRI:  Thursday September 08, 2017- 6:10PM- arrive by 5:50PM  @  [place],  [id number] [phone]  No jewelry or other metal. Please bring your photo ID and insurance cards with you.

## 2017-09-07 LAB — CBC WITH DIFFERENTIAL/PLATELET
Basophils Absolute: 0.1 10*3/uL (ref 0.0–0.1)
Basophils Relative: 0.8 % (ref 0.0–3.0)
Eosinophils Absolute: 0.1 10*3/uL (ref 0.0–0.7)
Eosinophils Relative: 0.7 % (ref 0.0–5.0)
HCT: 36.5 % (ref 36.0–46.0)
Hemoglobin: 12.2 g/dL (ref 12.0–15.0)
Lymphocytes Relative: 32.9 % (ref 12.0–46.0)
Lymphs Abs: 2.5 10*3/uL (ref 0.7–4.0)
MCHC: 33.3 g/dL (ref 30.0–36.0)
MCV: 91.7 fl (ref 78.0–100.0)
Monocytes Absolute: 0.4 10*3/uL (ref 0.1–1.0)
Monocytes Relative: 5.8 % (ref 3.0–12.0)
Neutro Abs: 4.5 10*3/uL (ref 1.4–7.7)
Neutrophils Relative %: 59.8 % (ref 43.0–77.0)
Platelets: 219 10*3/uL (ref 150.0–400.0)
RBC: 3.98 Mil/uL (ref 3.87–5.11)
RDW: 14 % (ref 11.5–15.5)
WBC: 7.6 10*3/uL (ref 4.0–10.5)

## 2017-09-07 LAB — BASIC METABOLIC PANEL
BUN: 17 mg/dL (ref 6–23)
CO2: 29 mEq/L (ref 19–32)
Calcium: 9 mg/dL (ref 8.4–10.5)
Chloride: 100 mEq/L (ref 96–112)
Creatinine, Ser: 1.13 mg/dL (ref 0.40–1.20)
GFR: 50.17 mL/min — ABNORMAL LOW (ref >60.00)
Glucose, Bld: 102 mg/dL — ABNORMAL HIGH (ref 70–99)
Potassium: 4.2 mEq/L (ref 3.5–5.1)
Sodium: 138 mEq/L (ref 135–145)

## 2017-09-07 LAB — SEDIMENTATION RATE: Sed Rate: 12 mm/hr (ref 0–30)

## 2017-09-07 NOTE — Assessment & Plan Note (Signed)
Dizziness, headaches: Both sxs started 2 months ago, they do not seem to be related to each other per patient. Dizziness have peripheral features, headache has migraine features but they are different from previous migraines. Neurological exam is normal but there is some tenderness over the TA arteries. Plan:  CBC, BMP, sed rate.  Stat MRI brain. ER if symptoms increase Fall prevention discussed, rec a cane/walker (has at home) Refer to neurology Further advise with results.   CHF: Recently BP was elevated, Lasix on hold, BP today is good but dizziness persist thus likely unrelated. B12 deficiency: Injection today RTC 10 days.  I will be out of town but by one of my partners will see her.  Patient is aware

## 2017-09-08 ENCOUNTER — Ambulatory Visit
Admission: RE | Admit: 2017-09-08 | Discharge: 2017-09-08 | Disposition: A | Discharge status: Home / Self Care | Payer: Medicare Other | Source: Ambulatory Visit | Attending: Internal Medicine | Admitting: Internal Medicine

## 2017-09-08 DIAGNOSIS — R42 Dizziness and giddiness: Secondary | ICD-10-CM | POA: Diagnosis not present

## 2017-09-09 ENCOUNTER — Telehealth: Payer: Self-pay | Admitting: Internal Medicine

## 2017-09-09 MED ORDER — MECLIZINE HCL 12.5 MG PO TABS: 12.5000 mg | ORAL_TABLET | Freq: Three times a day (TID) | ORAL | 0 refills | Status: DC | PRN

## 2017-09-09 NOTE — Telephone Encounter (Signed)
Spoke w/ Pt- informed of results and recommendations. Meclizine sent to [facility]. Pt verbalized understanding.

## 2017-09-09 NOTE — Telephone Encounter (Signed)
Spoke with radiology, brain MRI with no acute findings.  Blood work was negative. Advise patient: Tests came back okay, recommend meclizine 12.5 mg 1 or 2 p.o. 3 times daily as needed #30 no refills. Proceed with neurological evaluation ER if symptoms severe

## 2017-09-10 ENCOUNTER — Other Ambulatory Visit: Payer: Self-pay | Admitting: Internal Medicine

## 2017-09-16 ENCOUNTER — Ambulatory Visit (INDEPENDENT_AMBULATORY_CARE_PROVIDER_SITE_OTHER): Payer: Medicare Other | Admitting: Family Medicine

## 2017-09-16 ENCOUNTER — Encounter: Payer: Self-pay | Admitting: Family Medicine

## 2017-09-16 VITALS — BP 132/80 | HR 78 | Temp 98.1°F | Ht 66.0 in | Wt 240.2 lb

## 2017-09-16 DIAGNOSIS — R51 Headache: Secondary | ICD-10-CM | POA: Diagnosis not present

## 2017-09-16 DIAGNOSIS — R42 Dizziness and giddiness: Secondary | ICD-10-CM | POA: Diagnosis not present

## 2017-09-16 DIAGNOSIS — R519 Headache, unspecified: Secondary | ICD-10-CM

## 2017-09-16 MED ORDER — PREDNISONE 10 MG (21) PO TBPK: ORAL_TABLET | ORAL | 0 refills | Status: DC

## 2017-09-16 NOTE — Addendum Note (Signed)
Addended by: Ames Coupe on: 09/16/2017 03:31 PM   Modules accepted: Level of Service

## 2017-09-16 NOTE — Progress Notes (Signed)
Pre visit review using our clinic review tool, if applicable. No additional management support is needed unless otherwise documented below in the visit note. 

## 2017-09-16 NOTE — Progress Notes (Signed)
Chief Complaint  Patient presents with  . Follow-up    dizziness and headaches    [name] is 72 y.o. pt here for dizziness.  Duration: 2 months  It lasts for several seconds and goes away when she does not move. Pass out? No Spinning? Yes Recent illness/fever? No Headache? Yes Neurologic signs? No Change in PO intake? No   She has been having a headache for around the same time.  It is bilateral and she feels like this over her scalp.  It is burning in nature.  She has a referral appointment with neurology in 5 days.  It hurts when she combs her hair.  MRI of the brain was negative on 7/30.  Sed rate was unremarkable and not suggestive of giant cell arteritis.  Patient denies new neurologic signs.  She does complain of neck pain.  ROS:  Neuro: As noted in HPI Eyes: No vision changes  Past Medical History:  Diagnosis Date  . Anemia   . Anxiety   . Barrett's esophagus   . Bilateral carpal tunnel syndrome    Dr. Eddie Dibbles, having injection therapy  . C. difficile diarrhea 08/01/2012   severe 2014  . Chronic cystitis    Dr. Matilde Sprang  . Chronic lower back pain   . Depression   . DJD (degenerative joint disease)    bilateral hands; knees  . Family history of anesthesia complication    Mother had severe N/V  . GERD (gastroesophageal reflux disease)   . H/O cardiac catheterization    (-) cath 12-2002  , cath again 2011 (-)  . HTN (hypertension)   . Hyperlipidemia   . Insomnia   . Migraine   . Osteoporosis    pt unsure of this  . RLS (restless legs syndrome)   . Vitamin B 12 deficiency 04/09/2013    Family History  Problem Relation Age of Onset  . Lung cancer Mother   . Dementia Father   . CAD Father        dx in his 50s  . Stroke Father   . Colon cancer Maternal Grandfather   . Esophageal cancer Brother   . Breast cancer Sister   . Diabetes Other        Aunt  . Colon cancer Maternal Uncle        2  . Rectal cancer Neg Hx   . Stomach cancer Neg Hx      Allergies as of 09/16/2017      Reactions   Dextromethorphan-guaifenesin Nausea And Vomiting   Morphine Nausea And Vomiting   Penicillins Rash   "> 30 years ago; best I can remember it was just a light rash on my arm"      Medication List        Accurate as of 09/16/17  3:30 PM. Always use your most recent med list.          aspirin EC 81 MG tablet Take 81 mg by mouth daily.   cholecalciferol 1000 units tablet Commonly known as:  VITAMIN D Take 1,000 Units by mouth daily.   Cyanocobalamin 1000 MCG/ML Kit Inject 1,000 mcg as directed. Inject 1000 mcg into the skin once a month for vitamin b deficiency.   furosemide 20 MG tablet Commonly known as:  LASIX Take 1 tablet (20 mg total) by mouth as needed.   HYDROcodone-acetaminophen 10-325 MG tablet Commonly known as:  NORCO Take 1 tablet by mouth every 4 (four) hours as needed for moderate  pain or severe pain. For back pain   meclizine 12.5 MG tablet Commonly known as:  ANTIVERT Take 1-2 tablets (12.5-25 mg total) by mouth 3 (three) times daily as needed for dizziness.   metoprolol succinate 50 MG 24 hr tablet Commonly known as:  TOPROL-XL Take 1 tablet (50 mg total) by mouth daily. Take with or immediately following a meal.   nitroGLYCERIN 0.4 MG SL tablet Commonly known as:  NITROSTAT Place 1 tablet (0.4 mg total) under the tongue every 5 (five) minutes as needed for chest pain.   omeprazole 40 MG capsule Commonly known as:  PRILOSEC TAKE 1 CAPSULE (40 MG TOTAL) BY MOUTH 2 (TWO) TIMES DAILY.   predniSONE 10 MG (21) Tbpk tablet Commonly known as:  STERAPRED UNI-PAK 21 TAB Follow instructions on package.   rOPINIRole 1 MG tablet Commonly known as:  REQUIP TAKE 1 TABLET (1 MG TOTAL) BY MOUTH 2 (TWO) TIMES DAILY.   sacubitril-valsartan 97-103 MG Commonly known as:  ENTRESTO Take 1 tablet by mouth 2 (two) times daily.   SYSTANE BALANCE OP Apply 1 drop to eye as needed (DRY EYES).   traZODone 50 MG  tablet Commonly known as:  DESYREL Take 1-2 tablets (50-100 mg total) by mouth at bedtime.       BP 132/80 (BP Location: Left Arm, Patient Position: Sitting, Cuff Size: Normal)   Pulse 78   Temp 98.1 F (36.7 C) (Oral)   Ht '5\' 6"'  (1.676 m)   Wt 240 lb 4 oz (109 kg)   SpO2 97%   BMI 38.78 kg/m  General: Awake, alert, appears stated age Eyes: PERRLA, EOMi Ears: Patent, TM's neg b/l Heart: RRR, no murmurs, no carotid bruits Lungs: CTAB, no accessory muscle use MSK: 5/5 strength throughout, gait normal; ttp over cervical paraspinal musculature and suboccipital triangle bilaterally; there is no tenderness over the temporalis bilaterally Neuro: No cerebellar signs, patellar reflex 2/4 b/l wo clonus, calcaneal reflex 0/4 b/l wo clonus, biceps reflex 2/4 b/l wo clonus; Dix-Hall-Pike positive. Psych: Age appropriate judgment and insight, normal mood and affect  Bilateral headache - Plan: predniSONE (STERAPRED UNI-PAK 21 TAB) 10 MG (21) TBPK tablet  Vertigo  Steroid taper for headache.  I question occipital nerve involvement.  Will be interested to see if neurology does an occipital nerve block.  Given the recent work-up by her PCP, I do not believe anything sinister is going on. Her dizziness seems to be caused by BPPV.  Epley maneuver information provided.  If this does not improve, she can follow-up with neurology. F/u prn. Pt voiced understanding and agreement to the plan.  [place], DO 09/16/17 3:30 PM

## 2017-09-16 NOTE — Patient Instructions (Addendum)
Keep appointment with the Neurology team.   YouTube has good demonstration videos.   How to Perform the Epley Maneuver The Epley maneuver is an exercise that relieves symptoms of vertigo. Vertigo is the feeling that you or your surroundings are moving when they are not. When you feel vertigo, you may feel like the room is spinning and have trouble walking. Dizziness is a little different than vertigo. When you are dizzy, you may feel unsteady or light-headed. You can do this maneuver at home whenever you have symptoms of vertigo. You can do it up to 3 times a day until your symptoms go away. Even though the Epley maneuver may relieve your vertigo for a few weeks, it is possible that your symptoms will return. This maneuver relieves vertigo, but it does not relieve dizziness. What are the risks? If it is done correctly, the Epley maneuver is considered safe. Sometimes it can lead to dizziness or nausea that goes away after a short time. If you develop other symptoms, such as changes in vision, weakness, or numbness, stop doing the maneuver and call your health care provider. How to perform the Epley maneuver 1. Sit on the edge of a bed or table with your back straight and your legs extended or hanging over the edge of the bed or table. 2. Turn your head halfway toward the affected ear or side. 3. Lie backward quickly with your head turned until you are lying flat on your back. You may want to position a pillow under your shoulders. 4. Hold this position for 30 seconds. You may experience an attack of vertigo. This is normal. 5. Turn your head to the opposite direction until your unaffected ear is facing the floor. 6. Hold this position for 30 seconds. You may experience an attack of vertigo. This is normal. Hold this position until the vertigo stops. 7. Turn your whole body to the same side as your head. Hold for another 30 seconds. 8. Sit back up. You can repeat this exercise up to 3 times a  day. Follow these instructions at home:  After doing the Epley maneuver, you can return to your normal activities.  Ask your health care provider if there is anything you should do at home to prevent vertigo. He or she may recommend that you: ? Keep your head raised (elevated) with two or more pillows while you sleep. ? Do not sleep on the side of your affected ear. ? Get up slowly from bed. ? Avoid sudden movements during the day. ? Avoid extreme head movement, like looking up or bending over. Contact a health care provider if:  Your vertigo gets worse.  You have other symptoms, including: ? Nausea. ? Vomiting. ? Headache. Get help right away if:  You have vision changes.  You have a severe or worsening headache or neck pain.  You cannot stop vomiting.  You have new numbness or weakness in any part of your body. Summary  Vertigo is the feeling that you or your surroundings are moving when they are not.  The Epley maneuver is an exercise that relieves symptoms of vertigo.  If the Epley maneuver is done correctly, it is considered safe. You can do it up to 3 times a day. This information is not intended to replace advice given to you by your health care provider. Make sure you discuss any questions you have with your health care provider. Document Released: 01/30/2013 Document Revised: 12/16/2015 Document Reviewed: 12/16/2015 Elsevier Interactive Patient Education  2017 [place] (ROM) AND STRETCHING EXERCISES  These exercises may help you when beginning to rehabilitate your issue. In order to successfully resolve your symptoms, you must improve your posture. These exercises are designed to help reduce the forward-head and rounded-shoulder posture which contributes to this condition. Your symptoms may resolve with or without further involvement from your physician, physical therapist or athletic trainer. While completing these exercises,  remember:   Restoring tissue flexibility helps normal motion to return to the joints. This allows healthier, less painful movement and activity.  An effective stretch should be held for at least 20 seconds, although you may need to begin with shorter hold times for comfort.  A stretch should never be painful. You should only feel a gentle lengthening or release in the stretched tissue.  Do not do any stretch or exercise that you cannot tolerate.  STRETCH- Axial Extensors  Lie on your back on the floor. You may bend your knees for comfort. Place a rolled-up hand towel or dish towel, about 2 inches in diameter, under the part of your head that makes contact with the floor.  Gently tuck your chin, as if trying to make a "double chin," until you feel a gentle stretch at the base of your head.  Hold 15-20 seconds. Repeat 2-3 times. Complete this exercise 1 time per day.   STRETCH - Axial Extension   Stand or sit on a firm surface. Assume a good posture: chest up, shoulders drawn back, abdominal muscles slightly tense, knees unlocked (if standing) and feet hip width apart.  Slowly retract your chin so your head slides back and your chin slightly lowers. Continue to look straight ahead.  You should feel a gentle stretch in the back of your head. Be certain not to feel an aggressive stretch since this can cause headaches later.  Hold for 15-20 seconds. Repeat 2-3 times. Complete this exercise 1 time per day.  STRETCH - Cervical Side Bend   Stand or sit on a firm surface. Assume a good posture: chest up, shoulders drawn back, abdominal muscles slightly tense, knees unlocked (if standing) and feet hip width apart.  Without letting your nose or shoulders move, slowly tip your right / left ear to your shoulder until your feel a gentle stretch in the muscles on the opposite side of your neck.  Hold 15-20 seconds. Repeat 2-3 times. Complete this exercise 1-2 times per day.  STRETCH -  Cervical Rotators   Stand or sit on a firm surface. Assume a good posture: chest up, shoulders drawn back, abdominal muscles slightly tense, knees unlocked (if standing) and feet hip width apart.  Keeping your eyes level with the ground, slowly turn your head until you feel a gentle stretch along the back and opposite side of your neck.  Hold 15-20 seconds. Repeat 2-3 times. Complete this exercise 1-2 times per day.  RANGE OF MOTION - Neck Circles   Stand or sit on a firm surface. Assume a good posture: chest up, shoulders drawn back, abdominal muscles slightly tense, knees unlocked (if standing) and feet hip width apart.  Gently roll your head down and around from the back of one shoulder to the back of the other. The motion should never be forced or painful.  Repeat the motion 10-20 times, or until you feel the neck muscles relax and loosen. Repeat 2-3 times. Complete the exercise 1-2 times per day. STRENGTHENING EXERCISES - Cervical Strain and Sprain These exercises may  help you when beginning to rehabilitate your injury. They may resolve your symptoms with or without further involvement from your physician, physical therapist, or athletic trainer. While completing these exercises, remember:   Muscles can gain both the endurance and the strength needed for everyday activities through controlled exercises.  Complete these exercises as instructed by your physician, physical therapist, or athletic trainer. Progress the resistance and repetitions only as guided.  You may experience muscle soreness or fatigue, but the pain or discomfort you are trying to eliminate should never worsen during these exercises. If this pain does worsen, stop and make certain you are following the directions exactly. If the pain is still present after adjustments, discontinue the exercise until you can discuss the trouble with your clinician.  STRENGTH - Cervical Flexors, Isometric  Face a wall, standing about 6  inches away. Place a small pillow, a ball about 6-8 inches in diameter, or a folded towel between your forehead and the wall.  Slightly tuck your chin and gently push your forehead into the soft object. Push only with mild to moderate intensity, building up tension gradually. Keep your jaw and forehead relaxed.  Hold 10 to 20 seconds. Keep your breathing relaxed.  Release the tension slowly. Relax your neck muscles completely before you start the next repetition. Repeat 2-3 times. Complete this exercise 1 time per day.  STRENGTH- Cervical Lateral Flexors, Isometric   Stand about 6 inches away from a wall. Place a small pillow, a ball about 6-8 inches in diameter, or a folded towel between the side of your head and the wall.  Slightly tuck your chin and gently tilt your head into the soft object. Push only with mild to moderate intensity, building up tension gradually. Keep your jaw and forehead relaxed.  Hold 10 to 20 seconds. Keep your breathing relaxed.  Release the tension slowly. Relax your neck muscles completely before you start the next repetition. Repeat 2-3 times. Complete this exercise 1 time per day.  STRENGTH - Cervical Extensors, Isometric   Stand about 6 inches away from a wall. Place a small pillow, a ball about 6-8 inches in diameter, or a folded towel between the back of your head and the wall.  Slightly tuck your chin and gently tilt your head back into the soft object. Push only with mild to moderate intensity, building up tension gradually. Keep your jaw and forehead relaxed.  Hold 10 to 20 seconds. Keep your breathing relaxed.  Release the tension slowly. Relax your neck muscles completely before you start the next repetition. Repeat 2-3 times. Complete this exercise 1 time per day.  POSTURE AND BODY MECHANICS CONSIDERATIONS Keeping correct posture when sitting, standing or completing your activities will reduce the stress put on different body tissues, allowing  injured tissues a chance to heal and limiting painful experiences. The following are general guidelines for improved posture. Your physician or physical therapist will provide you with any instructions specific to your needs. While reading these guidelines, remember:  The exercises prescribed by your provider will help you have the flexibility and strength to maintain correct postures.  The correct posture provides the optimal environment for your joints to work. All of your joints have less wear and tear when properly supported by a spine with good posture. This means you will experience a healthier, less painful body.  Correct posture must be practiced with all of your activities, especially prolonged sitting and standing. Correct posture is as important when doing repetitive low-stress activities (  typing) as it is when doing a single heavy-load activity (lifting).  PROLONGED STANDING WHILE SLIGHTLY LEANING FORWARD When completing a task that requires you to lean forward while standing in one place for a long time, place either foot up on a stationary 2- to 4-inch high object to help maintain the best posture. When both feet are on the ground, the low back tends to lose its slight inward curve. If this curve flattens (or becomes too large), then the back and your other joints will experience too much stress, fatigue more quickly, and can cause pain.   RESTING POSITIONS Consider which positions are most painful for you when choosing a resting position. If you have pain with flexion-based activities (sitting, bending, stooping, squatting), choose a position that allows you to rest in a less flexed posture. You would want to avoid curling into a fetal position on your side. If your pain worsens with extension-based activities (prolonged standing, working overhead), avoid resting in an extended position such as sleeping on your stomach. Most people will find more comfort when they rest with their spine in  a more neutral position, neither too rounded nor too arched. Lying on a non-sagging bed on your side with a pillow between your knees, or on your back with a pillow under your knees will often provide some relief. Keep in mind, being in any one position for a prolonged period of time, no matter how correct your posture, can still lead to stiffness.  WALKING Walk with an upright posture. Your ears, shoulders, and hips should all line up. OFFICE WORK When working at a desk, create an environment that supports good, upright posture. Without extra support, muscles fatigue and lead to excessive strain on joints and other tissues.  CHAIR:  A chair should be able to slide under your desk when your back makes contact with the back of the chair. This allows you to work closely.  The chair's height should allow your eyes to be level with the upper part of your monitor and your hands to be slightly lower than your elbows.  Body position: ? Your feet should make contact with the floor. If this is not possible, use a foot rest. ? Keep your ears over your shoulders. This will reduce stress on your neck and low back.

## 2017-09-19 ENCOUNTER — Ambulatory Visit: Payer: Self-pay | Admitting: *Deleted

## 2017-09-21 ENCOUNTER — Encounter: Payer: Self-pay | Admitting: Neurology

## 2017-09-21 ENCOUNTER — Ambulatory Visit (INDEPENDENT_AMBULATORY_CARE_PROVIDER_SITE_OTHER): Payer: Medicare Other | Admitting: Neurology

## 2017-09-21 VITALS — BP 109/55 | HR 56 | Ht 66.0 in | Wt 240.5 lb

## 2017-09-21 DIAGNOSIS — R269 Unspecified abnormalities of gait and mobility: Secondary | ICD-10-CM

## 2017-09-21 DIAGNOSIS — R42 Dizziness and giddiness: Secondary | ICD-10-CM | POA: Diagnosis not present

## 2017-09-21 NOTE — Progress Notes (Signed)
PATIENT: [name] DOB: 1945/09/25  Chief Complaint  Patient presents with  . Dizziness    Orthostatic Vitals: Lying: 109/55, 56, Sitting: 113/66, 61, Standing: 99/61, 72, Standing x 3 minutes: 99/67, 72.  States dizziness is worse with sudden movements, especially when bending over or standing from a seated position.  [name] Headache    She has been having daily headaches for two months.  She does not medicate her pain.  Her PCP prescribed Prednisone but she has not started it yet.  She wanted to be evaluated here first.  She sometimes has nausea, blurred vision and dizziness with her headaches.  She has previously had difficulty with migraines but her last one was two years ago.  [name] PCP    Colon Branch, MD     HISTORICAL  ROSHELL [name] is a 72 years old female, seen in request by his primary care physician Dr. Larose Kells, [facility] for evaluation of dizziness, headaches, initial evaluation was on September 21, 2017.  I reviewed and summarized the referring note, she had a past medical history of hypertension, hyperlipidemia, restless leg syndrome, obesity.  She is on disability following her low back surgery at age 30, prior to the surgery, she presented with right lumbar radiculopathy.  She also had a history of right knee replacement, recent suffered right metatarsal fracture with no clear triggers,  In June 2019, she noticed acute onset of vertigo, quickly developed over few hours span, woke up feeling dizzy, especially with sudden positional change, she has difficulty focusing, unsteady, hard to keep her balance, she already has bilateral tinnitus due to previous job-related injury, mild hearing loss, there was no significant worsening in her hearing, but over the past 2 months, her symptoms gradually getting worse, Mostly in sitting position, but it can happen when she moving her head quickly lying down, or sitting down position too.  I personally reviewed MRI of the brain on September 08, 2017 showed  evidence of mild generalized atrophy, no acute abnormality  Echocardiogram in March 2019, ejection fraction 40 to 45%, wall thickness was increased consistent with moderate left ventricular hypertrophy  Cardiac catheterization March 2019, no angiographic significant coronary artery disease, moderate to severely reduced left ventricular contraction 30 to 35% consistent with nonischemic cardiomyopathy, are probably normal to mildly elevated left heart, right heart, pulmonary artery pressure,  Laboratory evaluations in 2019, normal ESR, CBC, BMP with creatinine of 1.13, INR of 1.06, negative troponin, BNP of 112.5  REVIEW OF SYSTEMS: Full 14 system review of systems performed and notable only for chills, fatigue, swelling legs, ringing the ears, itching, shortness of breath, diarrhea, constipation, easy bruising, feeling hot, cold, flushing, joint pain, achy muscles, runny nose, headaches, dizziness, restless leg, decreased energy, change in appetite  ALLERGIES: Allergies  Allergen Reactions  . Dextromethorphan-Guaifenesin Nausea And Vomiting  . Morphine Nausea And Vomiting  . Penicillins Rash    "> 30 years ago; best I can remember it was just a light rash on my arm"    HOME MEDICATIONS: Current Outpatient Medications  Medication Sig Dispense Refill  . aspirin EC 81 MG tablet Take 81 mg by mouth daily.    . cholecalciferol (VITAMIN D) 1000 units tablet Take 1,000 Units by mouth daily.    . Cyanocobalamin 1000 MCG/ML KIT Inject 1,000 mcg as directed. Inject 1000 mcg into the skin once a month for vitamin b deficiency.    . furosemide (LASIX) 20 MG tablet Take 1 tablet (20 mg total)  by mouth as needed. 90 tablet 3  . HYDROcodone-acetaminophen (NORCO) 10-325 MG tablet Take 1 tablet by mouth every 4 (four) hours as needed for moderate pain or severe pain. For back pain 20 tablet 0  . meclizine (ANTIVERT) 12.5 MG tablet Take 1-2 tablets (12.5-25 mg total) by mouth 3 (three) times daily as needed  for dizziness. 30 tablet 0  . metoprolol succinate (TOPROL-XL) 50 MG 24 hr tablet Take 1 tablet (50 mg total) by mouth daily. Take with or immediately following a meal. 90 tablet 3  . nitroGLYCERIN (NITROSTAT) 0.4 MG SL tablet Place 1 tablet (0.4 mg total) under the tongue every 5 (five) minutes as needed for chest pain. 30 tablet 0  . omeprazole (PRILOSEC) 40 MG capsule TAKE 1 CAPSULE (40 MG TOTAL) BY MOUTH 2 (TWO) TIMES DAILY. 180 capsule 0  . predniSONE (STERAPRED UNI-PAK 21 TAB) 10 MG (21) TBPK tablet Follow instructions on package. 21 tablet 0  . Propylene Glycol (SYSTANE BALANCE OP) Apply 1 drop to eye as needed (DRY EYES).    [name] rOPINIRole (REQUIP) 1 MG tablet TAKE 1 TABLET (1 MG TOTAL) BY MOUTH 2 (TWO) TIMES DAILY. 60 tablet 11  . sacubitril-valsartan (ENTRESTO) 97-103 MG Take 1 tablet by mouth 2 (two) times daily. 180 tablet 3  . traZODone (DESYREL) 50 MG tablet Take 1-2 tablets (50-100 mg total) by mouth at bedtime. 60 tablet 5   No current facility-administered medications for this visit.     PAST MEDICAL HISTORY: Past Medical History:  Diagnosis Date  . Anemia   . Anxiety   . Barrett's esophagus   . Bilateral carpal tunnel syndrome    Dr. Eddie Dibbles, having injection therapy  . C. difficile diarrhea 08/01/2012   severe 2014  . Chronic cystitis    Dr. Matilde Sprang  . Chronic lower back pain   . Depression   . Dizziness   . DJD (degenerative joint disease)    bilateral hands; knees  . Family history of anesthesia complication    Mother had severe N/V  . GERD (gastroesophageal reflux disease)   . H/O cardiac catheterization    (-) cath 12-2002  , cath again 2011 (-)  . HTN (hypertension)   . Hyperlipidemia   . Insomnia   . Migraine   . Osteoporosis    pt unsure of this  . RLS (restless legs syndrome)   . Vitamin B 12 deficiency 04/09/2013    PAST SURGICAL HISTORY: Past Surgical History:  Procedure Laterality Date  . Arm surgery Left    "don't remember what they did; arm  wasn't broken"  . BILATERAL KNEE ARTHROSCOPY Bilateral   . CARDIAC CATHETERIZATION  01/22/10   clean cath  . CATARACT EXTRACTION W/ INTRAOCULAR LENS  IMPLANT, BILATERAL Bilateral   . CHOLECYSTECTOMY N/A 10/25/2016   Procedure: LAPAROSCOPIC CHOLECYSTECTOMY;  Surgeon: Georganna Skeans, MD;  Location: [facility];  Service: General;  Laterality: N/A;  . COLONOSCOPY W/ POLYPECTOMY    . FOOT SURGERY Bilateral    toenails removed; callus removed on right; hammertoes right"  . Knot     "removed from right neck; not a goiter"  . LUMBAR DISC SURGERY     L5 S1 anterior fusion  . OOPHORECTOMY    . RIGHT/LEFT HEART CATH AND CORONARY ANGIOGRAPHY N/A 05/06/2017   Procedure: RIGHT/LEFT HEART CATH AND CORONARY ANGIOGRAPHY;  Surgeon: Nelva Bush, MD;  Location: [facility];  Service: Cardiovascular;  Laterality: N/A;  . SHOULDER ARTHROSCOPY Right   . TOTAL KNEE  ARTHROPLASTY  10/05/2011   Procedure: TOTAL KNEE ARTHROPLASTY;  Surgeon: Hessie Dibble, MD;  Location: [facility];  Service: Orthopedics;  Laterality: Right;  [name] VAGINAL HYSTERECTOMY     for endometriosis    FAMILY HISTORY: Family History  Problem Relation Age of Onset  . Lung cancer Mother   . Dementia Father   . CAD Father        dx in his 54s  . Stroke Father   . Congestive Heart Failure Father   . Colon cancer Maternal Grandfather   . Esophageal cancer Brother   . Breast cancer Sister   . Diabetes Other        Aunt  . Colon cancer Maternal Uncle        2  . Hepatitis C Brother   . Rectal cancer Neg Hx   . Stomach cancer Neg Hx     SOCIAL HISTORY: Social History   Socioeconomic History  . Marital status: Divorced    Spouse name: Not on file  . Number of children: 2  . Years of education: 44  . Highest education level: High school graduate  Occupational History  . Occupation: Retired, post Ecologist: RETIRED  Social Needs  . Financial resource strain: Not on file  . Food insecurity:    Worry: Not on file      Inability: Not on file  . Transportation needs:    Medical: Not on file    Non-medical: Not on file  Tobacco Use  . Smoking status: Former Smoker    Packs/day: 0.50    Years: 10.00    Pack years: 5.00    Last attempt to quit: 02/09/1975    Years since quitting: 42.6  . Smokeless tobacco: Never Used  . Tobacco comment: started at age 19.   quit in the 38s.  Substance and Sexual Activity  . Alcohol use: Yes    Alcohol/week: 0.0 standard drinks    Comment: 2 glasses of wine per year  . Drug use: No    Comment: CBD and hemp OIL   . Sexual activity: Not Currently    Partners: Male  Lifestyle  . Physical activity:    Days per week: Not on file    Minutes per session: Not on file  . Stress: Not on file  Relationships  . Social connections:    Talks on phone: Not on file    Gets together: Not on file    Attends religious service: Not on file    Active member of club or organization: Not on file    Attends meetings of clubs or organizations: Not on file    Relationship status: Not on file  . Intimate partner violence:    Fear of current or ex partner: Not on file    Emotionally abused: Not on file    Physically abused: Not on file    Forced sexual activity: Not on file  Other Topics Concern  . Not on file  Social History Narrative   Son lives with her.   Right-handed.   2 soft drinks per day.     PHYSICAL EXAM   Vitals:   09/21/17 1333  BP: (!) 109/55  Pulse: (!) 56  Weight: 240 lb 8 oz (109.1 kg)  Height: '5\' 6"'  (1.676 m)    Not recorded      Body mass index is 38.82 kg/m.  PHYSICAL EXAMNIATION:  Gen: NAD, conversant, well nourised, obese, well groomed  Cardiovascular: Regular rate rhythm, no peripheral edema, warm, nontender. Eyes: Conjunctivae clear without exudates or hemorrhage Neck: Supple, no carotid bruits. Pulmonary: Clear to auscultation bilaterally   NEUROLOGICAL EXAM:  MENTAL STATUS: Speech:    Speech is normal;  fluent and spontaneous with normal comprehension.  Cognition:     Orientation to time, place and person     Normal recent and remote memory     Normal Attention span and concentration     Normal Language, naming, repeating,spontaneous speech     Fund of knowledge   CRANIAL NERVES: CN II: Visual fields are full to confrontation. Fundoscopic exam is normal with sharp discs and no vascular changes. Pupils are round equal and briskly reactive to light. CN III, IV, VI: extraocular movement are normal. No ptosis.  There was end gaze horizontal nystagmus to gaze direction CN V: Facial sensation is intact to pinprick in all 3 divisions bilaterally. Corneal responses are intact.  CN VII: Face is symmetric with normal eye closure and smile. CN VIII: Hearing is normal to rubbing fingers CN IX, X: Palate elevates symmetrically. Phonation is normal. CN XI: Head turning and shoulder shrug are intact CN XII: Tongue is midline with normal movements and no atrophy.  MOTOR: There is no pronator drift of out-stretched arms. Muscle bulk and tone are normal. Muscle strength is normal.  REFLEXES: Reflexes are 2+ and symmetric at the biceps, triceps, knees, and ankles. Plantar responses are flexor.  SENSORY: Intact to light touch, pinprick, positional sensation and vibratory sensation are intact in fingers and toes.  COORDINATION: Rapid alternating movements and fine finger movements are intact. There is no dysmetria on finger-to-nose and heel-knee-shin.    GAIT/STANCE: She needs pushed up to get up from seated position, wide-based, unsteady, could not perform tandem walking  DIAGNOSTIC DATA (LABS, IMAGING, TESTING) - I reviewed patient records, labs, notes, testing and imaging myself where available.   ASSESSMENT AND PLAN  ANNALAURA SAUSEDA is a 73 y.o. female   Acute onset of vertigo, imbalance,  Differentiation diagnosis include benign positional vertigo, versus deconditioning, small brainstem  stroke   Refer her to vestibular rehabilitation  She does has multiple vascular risk factors,  Proceed with ultrasound of carotid artery  Keep daily aspirin   Marcial Pacas, M.D. Ph.D.  [facility] [address] [place], [place] [address] Ph: [phone] Fax: [phone]  CC:  Colon Branch, MD

## 2017-09-22 NOTE — Progress Notes (Signed)
Subjective:   [name] is a 72 y.o. female who presents for Medicare Annual (Subsequent) preventive examination.  Pt enjoys playing word games on her phone.   Review of Systems: No ROS.  Medicare Wellness Visit. Additional risk factors are reflected in the social history. Cardiac Risk Factors include: advanced age (>67mn, >>63women);dyslipidemia;hypertension;obesity (BMI >30kg/m2);sedentary lifestyle Sleep patterns: Takes Trazodone at bedtime. Sleeps the best with 2 tablets Home Safety/Smoke Alarms: Feels safe in home. Smoke alarms in place.  Living environment; residence and Firearm Safety: Lives with adult son. 1 story house with ramp. Walk-in shower with bench.    Female:       Mammo- ordered      Dexa scan-utd        CCS-last 12/18/14     Objective:     Vitals: BP 110/62 (BP Location: Right Wrist, Patient Position: Sitting, Cuff Size: Normal)   Pulse (!) 59   Ht '5\' 6"'  (1.676 m)   Wt 243 lb (110.2 kg)   SpO2 97%   BMI 39.22 kg/m   Body mass index is 39.22 kg/m.  Advanced Directives 09/23/2017 05/05/2017 10/25/2016 09/15/2016 05/30/2015 05/01/2015 12/18/2014  Does Patient Have a Medical Advance Directive? No Yes No No Yes No No  Type of Advance Directive - Healthcare Power of [place]- -  Does patient want to make changes to medical advance directive? - No - Patient declined - - - - -  Copy of [place] Chart? - No - copy requested - - - - -  Would patient like information on creating a medical advance directive? Yes (MAU/Ambulatory/Procedural Areas - Information given) - No - Patient declined No - Patient declined - - -  Pre-existing out of facility DNR order (yellow form or pink MOST form) - - - - - - -    Tobacco Social History   Tobacco Use  Smoking Status Former Smoker  . Packs/day: 0.50  . Years: 10.00  . Pack years: 5.00  . Last attempt to quit: 02/09/1975  . Years since quitting: 42.6  Smokeless Tobacco  Never Used  Tobacco Comment   started at age 72   quit in the 185s     Counseling given: Not Answered Comment: started at age 72   quit in the 1970s.   Clinical Intake: Pain : No/denies pain    Past Medical History:  Diagnosis Date  . Anemia   . Anxiety   . Barrett's esophagus   . Bilateral carpal tunnel syndrome    Dr. PEddie Dibbles having injection therapy  . C. difficile diarrhea 08/01/2012   severe 2014  . Chronic cystitis    Dr. MMatilde Sprang . Chronic lower back pain   . Depression   . Dizziness   . DJD (degenerative joint disease)    bilateral hands; knees  . Family history of anesthesia complication    Mother had severe N/V  . GERD (gastroesophageal reflux disease)   . H/O cardiac catheterization    (-) cath 12-2002  , cath again 2011 (-)  . HTN (hypertension)   . Hyperlipidemia   . Insomnia   . Migraine   . Osteoporosis    pt unsure of this  . RLS (restless legs syndrome)   . Vitamin B 12 deficiency 04/09/2013   Past Surgical History:  Procedure Laterality Date  . Arm surgery Left    "don't remember what they did; arm wasn't broken"  . BILATERAL  KNEE ARTHROSCOPY Bilateral   . CARDIAC CATHETERIZATION  01/22/10   clean cath  . CATARACT EXTRACTION W/ INTRAOCULAR LENS  IMPLANT, BILATERAL Bilateral   . CHOLECYSTECTOMY N/A 10/25/2016   Procedure: LAPAROSCOPIC CHOLECYSTECTOMY;  Surgeon: Georganna Skeans, MD;  Location: [facility];  Service: General;  Laterality: N/A;  . COLONOSCOPY W/ POLYPECTOMY    . FOOT SURGERY Bilateral    toenails removed; callus removed on right; hammertoes right"  . Knot     "removed from right neck; not a goiter"  . LUMBAR DISC SURGERY     L5 S1 anterior fusion  . OOPHORECTOMY    . RIGHT/LEFT HEART CATH AND CORONARY ANGIOGRAPHY N/A 05/06/2017   Procedure: RIGHT/LEFT HEART CATH AND CORONARY ANGIOGRAPHY;  Surgeon: Nelva Bush, MD;  Location: [facility];  Service: Cardiovascular;  Laterality: N/A;  . SHOULDER ARTHROSCOPY Right   .  TOTAL KNEE ARTHROPLASTY  10/05/2011   Procedure: TOTAL KNEE ARTHROPLASTY;  Surgeon: Hessie Dibble, MD;  Location: [facility];  Service: Orthopedics;  Laterality: Right;  [name] VAGINAL HYSTERECTOMY     for endometriosis   Family History  Problem Relation Age of Onset  . Lung cancer Mother   . Dementia Father   . CAD Father        dx in his 52s  . Stroke Father   . Congestive Heart Failure Father   . Colon cancer Maternal Grandfather   . Esophageal cancer Brother   . Breast cancer Sister   . Diabetes Other        Aunt  . Colon cancer Maternal Uncle        2  . Hepatitis C Brother   . Rectal cancer Neg Hx   . Stomach cancer Neg Hx    Social History   Socioeconomic History  . Marital status: Divorced    Spouse name: Not on file  . Number of children: 2  . Years of education: 23  . Highest education level: High school graduate  Occupational History  . Occupation: Retired, post Ecologist: RETIRED  Social Needs  . Financial resource strain: Not on file  . Food insecurity:    Worry: Not on file    Inability: Not on file  . Transportation needs:    Medical: Not on file    Non-medical: Not on file  Tobacco Use  . Smoking status: Former Smoker    Packs/day: 0.50    Years: 10.00    Pack years: 5.00    Last attempt to quit: 02/09/1975    Years since quitting: 42.6  . Smokeless tobacco: Never Used  . Tobacco comment: started at age 38.   quit in the 65s.  Substance and Sexual Activity  . Alcohol use: Yes    Alcohol/week: 0.0 standard drinks    Comment: 2 glasses of wine per year  . Drug use: No    Comment: CBD and hemp OIL   . Sexual activity: Not Currently    Partners: Male  Lifestyle  . Physical activity:    Days per week: Not on file    Minutes per session: Not on file  . Stress: Not on file  Relationships  . Social connections:    Talks on phone: Not on file    Gets together: Not on file    Attends religious service: Not on file    Active member of club  or organization: Not on file    Attends meetings of clubs or organizations:  Not on file    Relationship status: Not on file  Other Topics Concern  . Not on file  Social History Narrative   Son lives with her.   Right-handed.   2 soft drinks per day.    Outpatient Encounter Medications as of 09/23/2017  Medication Sig  . aspirin EC 81 MG tablet Take 81 mg by mouth daily.  . cholecalciferol (VITAMIN D) 1000 units tablet Take 1,000 Units by mouth daily.  . Cyanocobalamin 1000 MCG/ML KIT Inject 1,000 mcg as directed. Inject 1000 mcg into the skin once a month for vitamin b deficiency.  . furosemide (LASIX) 20 MG tablet Take 1 tablet (20 mg total) by mouth as needed.  [name] HYDROcodone-acetaminophen (NORCO) 10-325 MG tablet Take 1 tablet by mouth every 4 (four) hours as needed for moderate pain or severe pain. For back pain  . meclizine (ANTIVERT) 12.5 MG tablet Take 1-2 tablets (12.5-25 mg total) by mouth 3 (three) times daily as needed for dizziness.  . metoprolol succinate (TOPROL-XL) 50 MG 24 hr tablet Take 1 tablet (50 mg total) by mouth daily. Take with or immediately following a meal.  . nitroGLYCERIN (NITROSTAT) 0.4 MG SL tablet Place 1 tablet (0.4 mg total) under the tongue every 5 (five) minutes as needed for chest pain.  [name] omeprazole (PRILOSEC) 40 MG capsule TAKE 1 CAPSULE (40 MG TOTAL) BY MOUTH 2 (TWO) TIMES DAILY.  [name] predniSONE (STERAPRED UNI-PAK 21 TAB) 10 MG (21) TBPK tablet Follow instructions on package.  [name] Propylene Glycol (SYSTANE BALANCE OP) Apply 1 drop to eye as needed (DRY EYES).  [name] rOPINIRole (REQUIP) 1 MG tablet TAKE 1 TABLET (1 MG TOTAL) BY MOUTH 2 (TWO) TIMES DAILY.  . sacubitril-valsartan (ENTRESTO) 97-103 MG Take 1 tablet by mouth 2 (two) times daily.  . traZODone (DESYREL) 50 MG tablet Take 1-2 tablets (50-100 mg total) by mouth at bedtime.   No facility-administered encounter medications on file as of 09/23/2017.     Activities of Daily Living In your present state  of health, do you have any difficulty performing the following activities: 09/23/2017 05/05/2017  Hearing? N N  Vision? N N  Comment wears glasses for reading. -  Difficulty concentrating or making decisions? N N  Walking or climbing stairs? N N  Dressing or bathing? N N  Doing errands, shopping? N N  Preparing Food and eating ? N -  Using the Toilet? N -  In the past six months, have you accidently leaked urine? N -  Do you have problems with loss of bowel control? N -  Managing your Medications? N -  Managing your Finances? N -  Housekeeping or managing your Housekeeping? N -  Some recent data might be hidden    Patient Care Team: Colon Branch, MD as PCP - General Jerline Pain, MD as PCP - Cardiology (Cardiology) Bjorn Loser, MD as Consulting Physician (Urology) Maia Breslow, MD as Consulting Physician (Orthopedic Surgery) Alden Hipp, MD as Consulting Physician (Obstetrics and Gynecology) Phylliss Bob, MD as Consulting Physician (Orthopedic Surgery) Red Christians, MD as Referring Physician (Pain Medicine)    Assessment:   This is a routine wellness examination for [facility]. Physical assessment deferred to PCP.   Exercise Activities and Dietary recommendations Current Exercise Habits: The patient does not participate in regular exercise at present, Exercise limited by: orthopedic condition(s)(back pain) Diet (meal preparation, eat out, water intake, caffeinated beverages, dairy products, fruits and vegetables): in general, an "unhealthy" diet Breakfast:tenderloin biscuit 1/2 for  breakfast  Lunch: 1/2 for lunch Dinner:  cheeseburger    Goals    . Pt states she would like to come off of BP meds (pt-stated)     By limiting salt intake, eating a healthy diet, and exercising when the weather is cooler. She will also increase water intake.       Fall Risk Fall Risk  09/23/2017 09/15/2016 09/15/2015 07/10/2015 03/17/2015  Falls in the past year? Yes Yes No Yes No  Number falls  in past yr: 2 or more 2 or more - 1 -  Injury with Fall? No Yes - Yes -  Comment - - - Minor  -  Risk for fall due to : Impaired balance/gait;Medication side effect;History of fall(s) - - - -  Follow up Education provided Education provided;Falls prevention discussed - Falls evaluation completed -     Depression Screen PHQ 2/9 Scores 09/23/2017 09/15/2016 09/15/2015 07/10/2015  PHQ - 2 Score 0 0 0 0  Exception Documentation - - - Patient refusal     Cognitive Function MMSE - Mini Mental State Exam 09/15/2016  Orientation to time 5  Orientation to Place 5  Registration 3  Attention/ Calculation 3  Recall 3  Language- name 2 objects 2  Language- repeat 1  Language- follow 3 step command 3  Language- read & follow direction 1  Write a sentence 1  Copy design 1  Total score 28        Immunization History  Administered Date(s) Administered  . Influenza Split 02/17/2011  . Influenza Whole 01/14/2010  . Influenza, High Dose Seasonal PF 01/24/2015  . Influenza,inj,Quad PF,6+ Mos 10/22/2013  . Influenza-Unspecified 03/13/2016, 10/20/2016  . Pneumococcal Conjugate-13 09/10/2013  . Pneumococcal Polysaccharide-23 04/14/2010  . Td 02/08/1998, 04/14/2010  . Zoster 12/06/2013    Screening Tests Health Maintenance  Topic Date Due  . MAMMOGRAM  09/20/2017  . INFLUENZA VACCINE  09/08/2017  . TETANUS/TDAP  04/13/2020  . COLONOSCOPY  12/17/2024  . DEXA SCAN  Completed  . Hepatitis C Screening  Completed  . PNA vac Low Risk Adult  Completed      Plan:    Please schedule your next medicare wellness visit with me in 1 yr.  Eat heart healthy diet (full of fruits, vegetables, whole grains, lean protein, water--limit salt, fat, and sugar intake) and increase physical activity as tolerated.  Continue doing brain stimulating activities (puzzles, reading, adult coloring books, staying active) to keep memory sharp.   Bring a copy of your living will and/or healthcare power of attorney to  your next office visit.  I have ordered your mammogram. Please schedule.  Please review information provided on diet and constipation.  I have personally reviewed and noted the following in the patient's chart:   . Medical and social history . Use of alcohol, tobacco or illicit drugs  . Current medications and supplements . Functional ability and status . Nutritional status . Physical activity . Advanced directives . List of other physicians . Hospitalizations, surgeries, and ER visits in previous 12 months . Vitals . Screenings to include cognitive, depression, and falls . Referrals and appointments  In addition, I have reviewed and discussed with patient certain preventive protocols, quality metrics, and best practice recommendations. A written personalized care plan for preventive services as well as general preventive health recommendations were provided to patient.     Shela Nevin, [place]  09/23/2017

## 2017-09-23 ENCOUNTER — Ambulatory Visit (INDEPENDENT_AMBULATORY_CARE_PROVIDER_SITE_OTHER): Payer: Medicare Other | Admitting: *Deleted

## 2017-09-23 ENCOUNTER — Encounter: Payer: Self-pay | Admitting: *Deleted

## 2017-09-23 ENCOUNTER — Ambulatory Visit: Payer: Self-pay | Admitting: *Deleted

## 2017-09-23 ENCOUNTER — Ambulatory Visit (INDEPENDENT_AMBULATORY_CARE_PROVIDER_SITE_OTHER): Payer: Medicare Other | Admitting: Psychology

## 2017-09-23 VITALS — BP 110/62 | HR 59 | Ht 66.0 in | Wt 243.0 lb

## 2017-09-23 DIAGNOSIS — Z1231 Encounter for screening mammogram for malignant neoplasm of breast: Secondary | ICD-10-CM

## 2017-09-23 DIAGNOSIS — Z0000 Encounter for general adult medical examination without abnormal findings: Secondary | ICD-10-CM

## 2017-09-23 DIAGNOSIS — F332 Major depressive disorder, recurrent severe without psychotic features: Secondary | ICD-10-CM | POA: Diagnosis not present

## 2017-09-23 DIAGNOSIS — Z1239 Encounter for other screening for malignant neoplasm of breast: Secondary | ICD-10-CM

## 2017-09-23 NOTE — Patient Instructions (Signed)
Please schedule your next medicare wellness visit with me in 1 yr.  Eat heart healthy diet (full of fruits, vegetables, whole grains, lean protein, water--limit salt, fat, and sugar intake) and increase physical activity as tolerated.  Continue doing brain stimulating activities (puzzles, reading, adult coloring books, staying active) to keep memory sharp.   Bring a copy of your living will and/or healthcare power of attorney to your next office visit.  I have ordered your mammogram. Please schedule.  Please review information provided on diet and constipation.   [name] , Thank you for taking time to come for your Medicare Wellness Visit. I appreciate your ongoing commitment to your health goals. Please review the following plan we discussed and let me know if I can assist you in the future.   These are the goals we discussed: Goals    . DIET - EAT MORE FRUITS AND VEGETABLES       This is a list of the screening recommended for you and due dates:  Health Maintenance  Topic Date Due  . Mammogram  09/20/2017  . Flu Shot  09/08/2017  . Tetanus Vaccine  04/13/2020  . Colon Cancer Screening  12/17/2024  . DEXA scan (bone density measurement)  Completed  .  Hepatitis C: One time screening is recommended by Center for Disease Control  (CDC) for  adults born from [birth year] through 1966.   Completed  . Pneumonia vaccines  Completed    Constipation, Adult Constipation is when a person:  Poops (has a bowel movement) fewer times in a week than normal.  Has a hard time pooping.  Has poop that is dry, hard, or bigger than normal.  Follow these instructions at home: Eating and drinking   Eat foods that have a lot of fiber, such as: ? Fresh fruits and vegetables. ? Whole grains. ? Beans.  Eat less of foods that are high in fat, low in fiber, or overly processed, such as: ? [place] fries. ? Hamburgers. ? Cookies. ? Candy. ? Soda.  Drink enough fluid to keep your pee (urine)  clear or pale yellow. General instructions  Exercise regularly or as told by your doctor.  Go to the restroom when you feel like you need to poop. Do not hold it in.  Take over-the-counter and prescription medicines only as told by your doctor. These include any fiber supplements.  Do pelvic floor retraining exercises, such as: ? Doing deep breathing while relaxing your lower belly (abdomen). ? Relaxing your pelvic floor while pooping.  Watch your condition for any changes.  Keep all follow-up visits as told by your doctor. This is important. Contact a doctor if:  You have pain that gets worse.  You have a fever.  You have not pooped for 4 days.  You throw up (vomit).  You are not hungry.  You lose weight.  You are bleeding from the anus.  You have thin, pencil-like poop (stool). Get help right away if:  You have a fever, and your symptoms suddenly get worse.  You leak poop or have blood in your poop.  Your belly feels hard or bigger than normal (is bloated).  You have very bad belly pain.  You feel dizzy or you faint. This information is not intended to replace advice given to you by your health care provider. Make sure you discuss any questions you have with your health care provider. Document Released: 07/14/2007 Document Revised: 08/15/2015 Document Reviewed: 07/16/2015 Elsevier Interactive Patient Education  2018  [place] DASH stands for "Dietary Approaches to Stop Hypertension." The DASH eating plan is a healthy eating plan that has been shown to reduce high blood pressure (hypertension). It may also reduce your risk for type 2 diabetes, heart disease, and stroke. The DASH eating plan may also help with weight loss. What are tips for following this plan? General guidelines  Avoid eating more than 2,300 mg (milligrams) of salt (sodium) a day. If you have hypertension, you may need to reduce your sodium intake to 1,500 mg a  day.  Limit alcohol intake to no more than 1 drink a day for nonpregnant women and 2 drinks a day for men. One drink equals 12 oz of beer, 5 oz of wine, or 1 oz of hard liquor.  Work with your health care provider to maintain a healthy body weight or to lose weight. Ask what an ideal weight is for you.  Get at least 30 minutes of exercise that causes your heart to beat faster (aerobic exercise) most days of the week. Activities may include walking, swimming, or biking.  Work with your health care provider or diet and nutrition specialist (dietitian) to adjust your eating plan to your individual calorie needs. Reading food labels  Check food labels for the amount of sodium per serving. Choose foods with less than 5 percent of the Daily Value of sodium. Generally, foods with less than 300 mg of sodium per serving fit into this eating plan.  To find whole grains, look for the word "whole" as the first word in the ingredient list. Shopping  Buy products labeled as "low-sodium" or "no salt added."  Buy fresh foods. Avoid canned foods and premade or frozen meals. Cooking  Avoid adding salt when cooking. Use salt-free seasonings or herbs instead of table salt or sea salt. Check with your health care provider or pharmacist before using salt substitutes.  Do not fry foods. Cook foods using healthy methods such as baking, boiling, grilling, and broiling instead.  Cook with heart-healthy oils, such as olive, canola, soybean, or sunflower oil. Meal planning   Eat a balanced diet that includes: ? 5 or more servings of fruits and vegetables each day. At each meal, try to fill half of your plate with fruits and vegetables. ? Up to 6-8 servings of whole grains each day. ? Less than 6 oz of lean meat, poultry, or fish each day. A 3-oz serving of meat is about the same size as a deck of cards. One egg equals 1 oz. ? 2 servings of low-fat dairy each day. ? A serving of nuts, seeds, or beans 5 times  each week. ? Heart-healthy fats. Healthy fats called Omega-3 fatty acids are found in foods such as flaxseeds and coldwater fish, like sardines, salmon, and mackerel.  Limit how much you eat of the following: ? Canned or prepackaged foods. ? Food that is high in trans fat, such as fried foods. ? Food that is high in saturated fat, such as fatty meat. ? Sweets, desserts, sugary drinks, and other foods with added sugar. ? Full-fat dairy products.  Do not salt foods before eating.  Try to eat at least 2 vegetarian meals each week.  Eat more home-cooked food and less restaurant, buffet, and fast food.  When eating at a restaurant, ask that your food be prepared with less salt or no salt, if possible. What foods are recommended? The items listed may not be a complete list. Talk with  your dietitian about what dietary choices are best for you. Grains Whole-grain or whole-wheat bread. Whole-grain or whole-wheat pasta. Brown rice. [place]. Bulgur. Whole-grain and low-sodium cereals. Pita bread. Low-fat, low-sodium crackers. Whole-wheat flour tortillas. Vegetables Fresh or frozen vegetables (raw, steamed, roasted, or grilled). Low-sodium or reduced-sodium tomato and vegetable juice. Low-sodium or reduced-sodium tomato sauce and tomato paste. Low-sodium or reduced-sodium canned vegetables. Fruits All fresh, dried, or frozen fruit. Canned fruit in natural juice (without added sugar). Meat and other protein foods Skinless chicken or [place]. Ground chicken or [place]. Pork with fat trimmed off. Fish and seafood. Egg whites. Dried beans, peas, or lentils. Unsalted nuts, nut butters, and seeds. Unsalted canned beans. Lean cuts of beef with fat trimmed off. Low-sodium, lean deli meat. Dairy Low-fat (1%) or fat-free (skim) milk. Fat-free, low-fat, or reduced-fat cheeses. Nonfat, low-sodium ricotta or cottage cheese. Low-fat or nonfat yogurt. Low-fat, low-sodium cheese. Fats and oils Soft margarine  without trans fats. Vegetable oil. Low-fat, reduced-fat, or light mayonnaise and salad dressings (reduced-sodium). Canola, safflower, olive, soybean, and sunflower oils. Avocado. Seasoning and other foods Herbs. Spices. Seasoning mixes without salt. Unsalted popcorn and pretzels. Fat-free sweets. What foods are not recommended? The items listed may not be a complete list. Talk with your dietitian about what dietary choices are best for you. Grains Baked goods made with fat, such as croissants, muffins, or some breads. Dry pasta or rice meal packs. Vegetables Creamed or fried vegetables. Vegetables in a cheese sauce. Regular canned vegetables (not low-sodium or reduced-sodium). Regular canned tomato sauce and paste (not low-sodium or reduced-sodium). Regular tomato and vegetable juice (not low-sodium or reduced-sodium). Angie Fava. Olives. Fruits Canned fruit in a light or heavy syrup. Fried fruit. Fruit in cream or butter sauce. Meat and other protein foods Fatty cuts of meat. Ribs. Fried meat. Berniece Salines. Sausage. Bologna and other processed lunch meats. Salami. Fatback. Hotdogs. Bratwurst. Salted nuts and seeds. Canned beans with added salt. Canned or smoked fish. Whole eggs or egg yolks. Chicken or [place] with skin. Dairy Whole or 2% milk, cream, and half-and-half. Whole or full-fat cream cheese. Whole-fat or sweetened yogurt. Full-fat cheese. Nondairy creamers. Whipped toppings. Processed cheese and cheese spreads. Fats and oils Butter. Stick margarine. Lard. Shortening. Ghee. Bacon fat. Tropical oils, such as coconut, palm kernel, or palm oil. Seasoning and other foods Salted popcorn and pretzels. Onion salt, garlic salt, seasoned salt, table salt, and sea salt. Worcestershire sauce. Tartar sauce. Barbecue sauce. Teriyaki sauce. Soy sauce, including reduced-sodium. Steak sauce. Canned and packaged gravies. Fish sauce. Oyster sauce. Cocktail sauce. Horseradish that you find on the shelf. Ketchup.  Mustard. Meat flavorings and tenderizers. Bouillon cubes. Hot sauce and Tabasco sauce. Premade or packaged marinades. Premade or packaged taco seasonings. Relishes. Regular salad dressings. Where to find more information:  National Heart, Lung, and [place]: https://wilson-eaton.com/  American Heart Association: www.heart.org Summary  The DASH eating plan is a healthy eating plan that has been shown to reduce high blood pressure (hypertension). It may also reduce your risk for type 2 diabetes, heart disease, and stroke.  With the DASH eating plan, you should limit salt (sodium) intake to 2,300 mg a day. If you have hypertension, you may need to reduce your sodium intake to 1,500 mg a day.  When on the DASH eating plan, aim to eat more fresh fruits and vegetables, whole grains, lean proteins, low-fat dairy, and heart-healthy fats.  Work with your health care provider or diet and nutrition specialist (dietitian) to adjust your eating  plan to your individual calorie needs. This information is not intended to replace advice given to you by your health care provider. Make sure you discuss any questions you have with your health care provider. Document Released: 01/14/2011 Document Revised: 01/19/2016 Document Reviewed: 01/19/2016 Elsevier Interactive Patient Education  [employer].

## 2017-09-23 NOTE — Progress Notes (Signed)
Noted. Agree with above.  [place], DO 09/23/17 4:46 PM

## 2017-09-29 ENCOUNTER — Ambulatory Visit (HOSPITAL_COMMUNITY)
Admission: RE | Admit: 2017-09-29 | Discharge: 2017-09-29 | Disposition: A | Discharge status: Home / Self Care | Payer: Medicare Other | Source: Ambulatory Visit | Attending: Neurology | Admitting: Neurology

## 2017-09-29 DIAGNOSIS — E785 Hyperlipidemia, unspecified: Secondary | ICD-10-CM | POA: Diagnosis not present

## 2017-09-29 DIAGNOSIS — R42 Dizziness and giddiness: Secondary | ICD-10-CM

## 2017-09-29 DIAGNOSIS — I6523 Occlusion and stenosis of bilateral carotid arteries: Secondary | ICD-10-CM | POA: Diagnosis not present

## 2017-09-29 DIAGNOSIS — R269 Unspecified abnormalities of gait and mobility: Secondary | ICD-10-CM | POA: Diagnosis not present

## 2017-09-29 DIAGNOSIS — I10 Essential (primary) hypertension: Secondary | ICD-10-CM | POA: Diagnosis not present

## 2017-09-29 NOTE — Progress Notes (Signed)
*  Preliminary Results* Carotid artery duplex has been completed. Bilateral internal carotid arteries are 1-39%. Vertebral arteries are patent with antegrade flow.  09/29/2017 2:24 PM  [facility] Dawna Part

## 2017-09-30 ENCOUNTER — Telehealth: Payer: Self-pay | Admitting: Neurology

## 2017-09-30 NOTE — Telephone Encounter (Signed)
Please call patient, ultrasound of carotid arteries showed less than 39% stenosis of bilateral internal carotid artery, she should take aspirin 81 mg daily  Final Interpretation: Right Carotid: Velocities in the right ICA are consistent with a 1-39% stenosis.  Left Carotid: Velocities in the left ICA are consistent with a 1-39% stenosis.  Vertebrals: Bilateral vertebral arteries demonstrate antegrade flow.

## 2017-09-30 NOTE — Telephone Encounter (Signed)
Spoke to patient - she is aware of the results and will continue taking aspirin 81mg  daily.

## 2017-10-07 ENCOUNTER — Ambulatory Visit (INDEPENDENT_AMBULATORY_CARE_PROVIDER_SITE_OTHER): Payer: Medicare Other | Admitting: Psychology

## 2017-10-07 DIAGNOSIS — F332 Major depressive disorder, recurrent severe without psychotic features: Secondary | ICD-10-CM | POA: Diagnosis not present

## 2017-10-08 ENCOUNTER — Ambulatory Visit (HOSPITAL_BASED_OUTPATIENT_CLINIC_OR_DEPARTMENT_OTHER)
Admission: RE | Admit: 2017-10-08 | Discharge: 2017-10-08 | Disposition: A | Discharge status: Home / Self Care | Payer: Medicare Other | Source: Ambulatory Visit | Attending: Family Medicine | Admitting: Family Medicine

## 2017-10-08 DIAGNOSIS — Z1231 Encounter for screening mammogram for malignant neoplasm of breast: Secondary | ICD-10-CM | POA: Insufficient documentation

## 2017-10-08 DIAGNOSIS — Z1239 Encounter for other screening for malignant neoplasm of breast: Secondary | ICD-10-CM

## 2017-10-21 ENCOUNTER — Ambulatory Visit: Payer: Medicare Other | Admitting: Psychology

## 2017-11-04 ENCOUNTER — Ambulatory Visit: Payer: Medicare Other | Admitting: Psychology

## 2017-11-18 ENCOUNTER — Ambulatory Visit: Payer: Medicare Other | Admitting: Psychology

## 2017-12-16 ENCOUNTER — Encounter: Payer: Self-pay | Admitting: Internal Medicine

## 2017-12-16 ENCOUNTER — Ambulatory Visit (INDEPENDENT_AMBULATORY_CARE_PROVIDER_SITE_OTHER): Payer: Medicare Other | Admitting: Psychology

## 2017-12-16 ENCOUNTER — Ambulatory Visit (INDEPENDENT_AMBULATORY_CARE_PROVIDER_SITE_OTHER): Payer: Medicare Other | Admitting: Internal Medicine

## 2017-12-16 VITALS — BP 132/78 | HR 76 | Temp 97.6°F | Resp 16 | Ht 66.0 in | Wt 241.2 lb

## 2017-12-16 DIAGNOSIS — I10 Essential (primary) hypertension: Secondary | ICD-10-CM

## 2017-12-16 DIAGNOSIS — E538 Deficiency of other specified B group vitamins: Secondary | ICD-10-CM | POA: Diagnosis not present

## 2017-12-16 DIAGNOSIS — M5412 Radiculopathy, cervical region: Secondary | ICD-10-CM | POA: Diagnosis not present

## 2017-12-16 DIAGNOSIS — F332 Major depressive disorder, recurrent severe without psychotic features: Secondary | ICD-10-CM

## 2017-12-16 DIAGNOSIS — R194 Change in bowel habit: Secondary | ICD-10-CM | POA: Diagnosis not present

## 2017-12-16 DIAGNOSIS — R7982 Elevated C-reactive protein (CRP): Secondary | ICD-10-CM | POA: Diagnosis not present

## 2017-12-16 DIAGNOSIS — Z23 Encounter for immunization: Secondary | ICD-10-CM

## 2017-12-16 DIAGNOSIS — M353 Polymyalgia rheumatica: Secondary | ICD-10-CM

## 2017-12-16 MED ORDER — CYANOCOBALAMIN 1000 MCG/ML IJ SOLN
1000.0000 ug | Freq: Once | INTRAMUSCULAR | Status: AC
  Administered 2017-12-16: 1000 ug via INTRAMUSCULAR

## 2017-12-16 NOTE — Patient Instructions (Signed)
GO TO THE LAB : Get the blood work     GO TO THE FRONT DESK Schedule your next appointment for a checkup in 6 weeks

## 2017-12-16 NOTE — Progress Notes (Signed)
Pre visit review using our clinic review tool, if applicable. No additional management support is needed unless otherwise documented below in the visit note. 

## 2017-12-16 NOTE — Progress Notes (Signed)
Subjective:    Patient ID: [name], female    DOB: September 05, 1945, 72 y.o.   MRN: [id number]  DOS:  12/16/2017 Type of visit - description : f/u, multiple symptoms Interval history: Headache dizziness: Chart reviewed.  Headache still there, dizziness still there mostly when she move her head. She sees pain management mostly for low back pain but today reports that she is hurting everywhere: Back, feet, legs, shoulders. Sometimes when she turns her head to the right she has right-sided neck pain.  Also 1 year history of occasional constipation and diarrhea.  Feeling bloated sometimes.  Review of Systems Denies fever, chills or weight loss No visual disturbances No jaw claudication No bladder incontinence but sometimes have some stool leakage.  Past Medical History:  Diagnosis Date  . Anemia   . Anxiety   . Barrett's esophagus   . Bilateral carpal tunnel syndrome    Dr. Eddie Dibbles, having injection therapy  . C. difficile diarrhea 08/01/2012   severe 2014  . Chronic cystitis    Dr. Matilde Sprang  . Chronic lower back pain   . Depression   . Dizziness   . DJD (degenerative joint disease)    bilateral hands; knees  . Family history of anesthesia complication    Mother had severe N/V  . GERD (gastroesophageal reflux disease)   . H/O cardiac catheterization    (-) cath 12-2002  , cath again 2011 (-)  . HTN (hypertension)   . Hyperlipidemia   . Insomnia   . Migraine   . Osteoporosis    pt unsure of this  . RLS (restless legs syndrome)   . Vitamin B 12 deficiency 04/09/2013    Past Surgical History:  Procedure Laterality Date  . Arm surgery Left    "don't remember what they did; arm wasn't broken"  . BILATERAL KNEE ARTHROSCOPY Bilateral   . CARDIAC CATHETERIZATION  01/22/10   clean cath  . CATARACT EXTRACTION W/ INTRAOCULAR LENS  IMPLANT, BILATERAL Bilateral   . CHOLECYSTECTOMY N/A 10/25/2016   Procedure: LAPAROSCOPIC CHOLECYSTECTOMY;  Surgeon: Georganna Skeans, MD;  Location:  [place];  Service: General;  Laterality: N/A;  . COLONOSCOPY W/ POLYPECTOMY    . FOOT SURGERY Bilateral    toenails removed; callus removed on right; hammertoes right"  . Knot     "removed from right neck; not a goiter"  . LUMBAR DISC SURGERY     L5 S1 anterior fusion  . OOPHORECTOMY    . RIGHT/LEFT HEART CATH AND CORONARY ANGIOGRAPHY N/A 05/06/2017   Procedure: RIGHT/LEFT HEART CATH AND CORONARY ANGIOGRAPHY;  Surgeon: Nelva Bush, MD;  Location: [place] [facility];  Service: Cardiovascular;  Laterality: N/A;  . SHOULDER ARTHROSCOPY Right   . TOTAL KNEE ARTHROPLASTY  10/05/2011   Procedure: TOTAL KNEE ARTHROPLASTY;  Surgeon: Hessie Dibble, MD;  Location: [place];  Service: Orthopedics;  Laterality: Right;  [name] VAGINAL HYSTERECTOMY     for endometriosis    Social History   Socioeconomic History  . Marital status: Divorced    Spouse name: Not on file  . Number of children: 2  . Years of education: 9  . Highest education level: High school graduate  Occupational History  . Occupation: Retired, post Ecologist: RETIRED  Social Needs  . Financial resource strain: Not on file  . Food insecurity:    Worry: Not on file    Inability: Not on file  . Transportation needs:    Medical: Not on  file    Non-medical: Not on file  Tobacco Use  . Smoking status: Former Smoker    Packs/day: 0.50    Years: 10.00    Pack years: 5.00    Last attempt to quit: 02/09/1975    Years since quitting: 42.8  . Smokeless tobacco: Never Used  . Tobacco comment: started at age 65.   quit in the 22s.  Substance and Sexual Activity  . Alcohol use: Yes    Alcohol/week: 0.0 standard drinks    Comment: 2 glasses of wine per year  . Drug use: No    Comment: CBD and hemp OIL   . Sexual activity: Not Currently    Partners: Male  Lifestyle  . Physical activity:    Days per week: Not on file    Minutes per session: Not on file  . Stress: Not on file  Relationships  . Social connections:     Talks on phone: Not on file    Gets together: Not on file    Attends religious service: Not on file    Active member of club or organization: Not on file    Attends meetings of clubs or organizations: Not on file    Relationship status: Not on file  . Intimate partner violence:    Fear of current or ex partner: Not on file    Emotionally abused: Not on file    Physically abused: Not on file    Forced sexual activity: Not on file  Other Topics Concern  . Not on file  Social History Narrative   Son lives with her.   Right-handed.   2 soft drinks per day.      Allergies as of 12/16/2017      Reactions   Dextromethorphan-guaifenesin Nausea And Vomiting   Morphine Nausea And Vomiting   Penicillins Rash   "> 30 years ago; best I can remember it was just a light rash on my arm"      Medication List        Accurate as of 12/16/17 11:59 PM. Always use your most recent med list.          aspirin EC 81 MG tablet Take 81 mg by mouth daily.   cholecalciferol 1000 units tablet Commonly known as:  VITAMIN D Take 1,000 Units by mouth daily.   Cyanocobalamin 1000 MCG/ML Kit Inject 1,000 mcg as directed. Inject 1000 mcg into the skin once a month for vitamin b deficiency.   furosemide 20 MG tablet Commonly known as:  LASIX Take 1 tablet (20 mg total) by mouth as needed.   meclizine 12.5 MG tablet Commonly known as:  ANTIVERT Take 1-2 tablets (12.5-25 mg total) by mouth 3 (three) times daily as needed for dizziness.   metoprolol succinate 50 MG 24 hr tablet Commonly known as:  TOPROL-XL Take 1 tablet (50 mg total) by mouth daily. Take with or immediately following a meal.   nitroGLYCERIN 0.4 MG SL tablet Commonly known as:  NITROSTAT Place 1 tablet (0.4 mg total) under the tongue every 5 (five) minutes as needed for chest pain.   omeprazole 40 MG capsule Commonly known as:  PRILOSEC TAKE 1 CAPSULE (40 MG TOTAL) BY MOUTH 2 (TWO) TIMES DAILY.   oxyCODONE-acetaminophen 5-325  MG tablet Commonly known as:  PERCOCET/ROXICET Take 1 tablet by mouth 2 (two) times daily as needed.   rOPINIRole 1 MG tablet Commonly known as:  REQUIP TAKE 1 TABLET (1 MG TOTAL) BY MOUTH 2 (TWO)  TIMES DAILY.   sacubitril-valsartan 97-103 MG Commonly known as:  ENTRESTO Take 1 tablet by mouth 2 (two) times daily.   SYSTANE BALANCE OP Apply 1 drop to eye as needed (DRY EYES).   traZODone 50 MG tablet Commonly known as:  DESYREL Take 1-2 tablets (50-100 mg total) by mouth at bedtime.          Objective:   Physical Exam BP 132/78 (BP Location: Left Arm, Patient Position: Sitting, Cuff Size: Normal)   Pulse 76   Temp 97.6 F (36.4 C) (Oral)   Resp 16   Ht _0  (1.676 m)   Wt 241 lb 4 oz (109.4 kg)   SpO2 95%   BMI 38.94 kg/m  General:   Well developed, NAD, BMI noted. HEENT:  Normocephalic . Face symmetric, atraumatic. Good T.A. pulses at the temples.  Mild discomfort at the left temple?. Lungs:  CTA B Normal respiratory effort, no intercostal retractions, no accessory muscle use. Heart: RRR,  no murmur.  No pretibial edema bilaterally  MSK: TTP on all major muscle groups Neurologic:  alert & oriented X3.  Speech normal, gait appropriate for age and unassisted Psych--  Cognition and judgment appear intact.  Cooperative with normal attention span and concentration.  Behavior appropriate. No anxious or depressed appearing.      Assessment & Plan:     Assessment HTN Hyperlipidemia-  Pravachol, Crestor: Myalgias. Intolerant to Zetia (joint aches), see OV  06/2016  Depression, Anxiety (on clonazepam), insomnia (on trazodone):  depression x many  years, remotely on zoloft and other meds per psych, I rx lexapro 2015, then was rx effexor x a while Morbid obesity CV: CHF/ non ischemic cardiomyopathy dx 04-2017; cath normal coronaries, EF ~ 30% Carotid artery disease: 1 to 39% bilateral carotids 09-2016, start aspirin per neurology GI:  --GERD, Barrett's  esophagus, EGD 05-2015, next per GI --h/o persistent C. difficile diarrhea 2014 B12 deficiency Osteopenia: Dexa 2015 showed osteopenia, T score -1.9 (10/2016): Rx cs, vit D RLS - Requip, see Pulmonary Migraines, f/u [facility] -- off topamax as off 06-2016 MSK: --Back pain, chronic: pain meds rx by ortho, Workers comp MD @ [facility] [place] --DJD,CTS B ([facility]) OAB, LUTS-- on myrbetriq Dr [place]armoth  Raynaud phenomena---- DX 07/2017  PLAN HTN: Controlled, continue Lasix, Toprol.  Check a BMP Headache dizziness: Since the last office visit, MRI of the brain showed no acute changes, sed rate negative.  Was eval by neurology, they recommended a carotid ultrasound: 1 to 39% bilaterally, was recommended aspirin and vestibular rehabilitation.  Still having symptoms, recommend to see neurology on f/u as recommended MSK: She sees pain management for low back pain but that is a Worker's Comp  issue and they won't address other pains. She has generalized aches, makes me concern about PMR particularly in the context of head ache and h/o Raynaud's  Phenomena dx few months ago. I am also concerned about the neck pain w/ head motion , ?neck radiculopathy Plan:  -Repeat the sed rate, CRP, total CKs, ANA, RF, TSH, refer to rheumatology -Refer to orthopedic surgery gel for orthopedics. Change in bowel habits: Reports diarrhea, constipation, stool leaking.  Refer to GI I like to chance to talk to her about her multiple symptoms from different organs and systems so recommend to RTC in 6 weeks

## 2017-12-17 LAB — HIGH SENSITIVITY CRP: hs-CRP: 5 mg/L — ABNORMAL HIGH

## 2017-12-17 LAB — CK: Total CK: 210 U/L — ABNORMAL HIGH (ref 29–143)

## 2017-12-17 LAB — BASIC METABOLIC PANEL
BUN: 15 mg/dL (ref 7–25)
CO2: 28 mmol/L (ref 20–32)
Calcium: 8.5 mg/dL — ABNORMAL LOW (ref 8.6–10.4)
Chloride: 102 mmol/L (ref 98–110)
Creat: 0.92 mg/dL (ref 0.60–0.93)
Glucose, Bld: 95 mg/dL (ref 65–99)
Potassium: 4.2 mmol/L (ref 3.5–5.3)
Sodium: 139 mmol/L (ref 135–146)

## 2017-12-17 LAB — TSH: TSH: 1.62 mIU/L (ref 0.40–4.50)

## 2017-12-17 LAB — SEDIMENTATION RATE: Sed Rate: 11 mm/h (ref 0–30)

## 2017-12-18 NOTE — Assessment & Plan Note (Signed)
HTN: Controlled, continue Lasix, Toprol.  Check a BMP Headache dizziness: Since the last office visit, MRI of the brain showed no acute changes, sed rate negative.  Was eval by neurology, they recommended a carotid ultrasound: 1 to 39% bilaterally, was recommended aspirin and vestibular rehabilitation.  Still having symptoms, recommend to see neurology on f/u as recommended MSK: She sees pain management for low back pain but that is a Worker's Comp  issue and they won't address other pains. She has generalized aches, makes me concern about PMR particularly in the context of head ache and h/o Raynaud's  Phenomena dx few months ago. I am also concerned about the neck pain w/ head motion , ?neck radiculopathy Plan:  -Repeat the sed rate, CRP, total CKs, ANA, RF, TSH, refer to rheumatology -Refer to orthopedic surgery gel for orthopedics. Change in bowel habits: Reports diarrhea, constipation, stool leaking.  Refer to GI I like to chance to talk to her about her multiple symptoms from different organs and systems so recommend to RTC in 6 weeks

## 2017-12-19 LAB — ANA: Anti Nuclear Antibody(ANA): NEGATIVE

## 2017-12-19 LAB — RHEUMATOID FACTOR: Rhuematoid fact SerPl-aCnc: <14 IU/mL (ref <14)

## 2017-12-21 ENCOUNTER — Other Ambulatory Visit: Payer: Self-pay

## 2017-12-21 DIAGNOSIS — M353 Polymyalgia rheumatica: Secondary | ICD-10-CM

## 2017-12-29 ENCOUNTER — Encounter: Payer: Self-pay | Admitting: Neurology

## 2017-12-29 ENCOUNTER — Ambulatory Visit (INDEPENDENT_AMBULATORY_CARE_PROVIDER_SITE_OTHER): Payer: Medicare Other | Admitting: Neurology

## 2017-12-29 VITALS — BP 131/75 | HR 86 | Ht 66.0 in | Wt 241.5 lb

## 2017-12-29 DIAGNOSIS — R269 Unspecified abnormalities of gait and mobility: Secondary | ICD-10-CM

## 2017-12-29 DIAGNOSIS — R202 Paresthesia of skin: Secondary | ICD-10-CM | POA: Diagnosis not present

## 2017-12-29 NOTE — Progress Notes (Signed)
PATIENT: [name] DOB: October 21, 1945  Chief Complaint  Patient presents with  . Imbalance    She would like to further review her carotid ultrasound results. She never started vestibular rehab but is interested in going now.  She would like to go to a facility close to her home in [place].  Her dizziness comes and goes.      HISTORICAL  [name] is a 72 years old female, seen in request by his primary care physician Dr. Larose Kells, [facility] for evaluation of dizziness, headaches, initial evaluation was on September 21, 2017.  I reviewed and summarized the referring note, she had a past medical history of hypertension, hyperlipidemia, restless leg syndrome, obesity.  She is on disability following her low back surgery at age 72, prior to the surgery, she presented with right lumbar radiculopathy.  She also had a history of right knee replacement, recent suffered right metatarsal fracture with no clear triggers,  In June 2019, she noticed acute onset of vertigo, quickly developed over few hours span, woke up feeling dizzy, especially with sudden positional change, she has difficulty focusing, unsteady, hard to keep her balance, she already has bilateral tinnitus due to previous job-related injury, mild hearing loss, there was no significant worsening in her hearing, but over the past 2 months, her symptoms gradually getting worse, Mostly in sitting position, but it can happen when she moving her head quickly lying down, or sitting down position too.  I personally reviewed MRI of the brain on September 08, 2017 showed evidence of mild generalized atrophy, no acute abnormality  Echocardiogram in March 2019, ejection fraction 40 to 45%, wall thickness was increased consistent with moderate left ventricular hypertrophy  Cardiac catheterization March 2019, no angiographic significant coronary artery disease, moderate to severely reduced left ventricular contraction 30 to 35% consistent with nonischemic  cardiomyopathy, are probably normal to mildly elevated left heart, right heart, pulmonary artery pressure,  Laboratory evaluations in 2019, normal ESR, CBC, BMP with creatinine of 1.13, INR of 1.06, negative troponin, BNP of 112.5  UPDATE Dec 29 2017: She continued to complains of dizziness when she got up from seated position, shortness of breath with minimal exertion, today's Blood pressure sitting down 144/83, heart rate of 90, standing up 148/102, heart rate of 120, standing up for 1 minute, 149/95 heart rate of 111,  Echocardiogram showed ejection fraction 40 to 45%, with inferoseptal hypokinesis, Cardiac catheter in March 2019 1. No angiographically significant coronary artery disease. 2. Moderately to severely reduced left ventricular contraction (LVEF 30-35%), consistent with non-ischemic cardiomyopathy. 3. Upper normal to mildly elevated left heart, right heart, and pulmonary artery pressures. Low normal to mildly decreased cardiac output/index.  Laboratory evaluation in November 2019: CPK was slightly elevated 210, normal TSH, C-reactive protein was 5.0, normal ESR, BMP showed glucose of 95, negative ANA,   REVIEW OF SYSTEMS: Full 14 system review of systems performed and notable only for chills, fatigue, swelling legs, ringing the ears, itching, shortness of breath, diarrhea, constipation, easy bruising, feeling hot, cold, flushing, joint pain, achy muscles, runny nose, headaches, dizziness, restless leg, decreased energy,   All rest review of the system were negative  ALLERGIES: Allergies  Allergen Reactions  . Dextromethorphan-Guaifenesin Nausea And Vomiting  . Morphine Nausea And Vomiting  . Penicillins Rash    "> 30 years ago; best I can remember it was just a light rash on my arm"    HOME MEDICATIONS: Current Outpatient Medications  Medication Sig  Dispense Refill  . aspirin EC 81 MG tablet Take 81 mg by mouth daily.    . cholecalciferol (VITAMIN D) 1000 units tablet  Take 1,000 Units by mouth daily.    . Cyanocobalamin 1000 MCG/ML KIT Inject 1,000 mcg as directed. Inject 1000 mcg into the skin once a month for vitamin b deficiency.    . furosemide (LASIX) 20 MG tablet Take 1 tablet (20 mg total) by mouth as needed. 90 tablet 3  . HYDROcodone-acetaminophen (NORCO) 10-325 MG tablet Take 1 tablet by mouth every 8 (eight) hours as needed.    . meclizine (ANTIVERT) 12.5 MG tablet Take 1-2 tablets (12.5-25 mg total) by mouth 3 (three) times daily as needed for dizziness. 30 tablet 0  . metoprolol succinate (TOPROL-XL) 50 MG 24 hr tablet Take 1 tablet (50 mg total) by mouth daily. Take with or immediately following a meal. 90 tablet 3  . nitroGLYCERIN (NITROSTAT) 0.4 MG SL tablet Place 1 tablet (0.4 mg total) under the tongue every 5 (five) minutes as needed for chest pain. 30 tablet 0  . omeprazole (PRILOSEC) 40 MG capsule TAKE 1 CAPSULE (40 MG TOTAL) BY MOUTH 2 (TWO) TIMES DAILY. 180 capsule 0  . Propylene Glycol (SYSTANE BALANCE OP) Apply 1 drop to eye as needed (DRY EYES).    [name] rOPINIRole (REQUIP) 1 MG tablet TAKE 1 TABLET (1 MG TOTAL) BY MOUTH 2 (TWO) TIMES DAILY. 60 tablet 11  . sacubitril-valsartan (ENTRESTO) 97-103 MG Take 1 tablet by mouth 2 (two) times daily. 180 tablet 3  . traZODone (DESYREL) 50 MG tablet Take 1-2 tablets (50-100 mg total) by mouth at bedtime. 60 tablet 5   No current facility-administered medications for this visit.     PAST MEDICAL HISTORY: Past Medical History:  Diagnosis Date  . Anemia   . Anxiety   . Barrett's esophagus   . Bilateral carpal tunnel syndrome    Dr. Eddie Dibbles, having injection therapy  . C. difficile diarrhea 08/01/2012   severe 2014  . Chronic cystitis    Dr. Matilde Sprang  . Chronic lower back pain   . Depression   . Dizziness   . DJD (degenerative joint disease)    bilateral hands; knees  . Family history of anesthesia complication    Mother had severe N/V  . GERD (gastroesophageal reflux disease)   . H/O  cardiac catheterization    (-) cath 12-2002  , cath again 2011 (-)  . HTN (hypertension)   . Hyperlipidemia   . Insomnia   . Migraine   . Osteoporosis    pt unsure of this  . RLS (restless legs syndrome)   . Vitamin B 12 deficiency 04/09/2013    PAST SURGICAL HISTORY: Past Surgical History:  Procedure Laterality Date  . Arm surgery Left    "don't remember what they did; arm wasn't broken"  . BILATERAL KNEE ARTHROSCOPY Bilateral   . CARDIAC CATHETERIZATION  01/22/10   clean cath  . CATARACT EXTRACTION W/ INTRAOCULAR LENS  IMPLANT, BILATERAL Bilateral   . CHOLECYSTECTOMY N/A 10/25/2016   Procedure: LAPAROSCOPIC CHOLECYSTECTOMY;  Surgeon: Georganna Skeans, MD;  Location: [place];  Service: General;  Laterality: N/A;  . COLONOSCOPY W/ POLYPECTOMY    . FOOT SURGERY Bilateral    toenails removed; callus removed on right; hammertoes right"  . Knot     "removed from right neck; not a goiter"  . LUMBAR DISC SURGERY     L5 S1 anterior fusion  . OOPHORECTOMY    . RIGHT/LEFT  HEART CATH AND CORONARY ANGIOGRAPHY N/A 05/06/2017   Procedure: RIGHT/LEFT HEART CATH AND CORONARY ANGIOGRAPHY;  Surgeon: Nelva Bush, MD;  Location: [place] [facility];  Service: Cardiovascular;  Laterality: N/A;  . SHOULDER ARTHROSCOPY Right   . TOTAL KNEE ARTHROPLASTY  10/05/2011   Procedure: TOTAL KNEE ARTHROPLASTY;  Surgeon: Hessie Dibble, MD;  Location: [place];  Service: Orthopedics;  Laterality: Right;  [name] VAGINAL HYSTERECTOMY     for endometriosis    FAMILY HISTORY: Family History  Problem Relation Age of Onset  . Lung cancer Mother   . Dementia Father   . CAD Father        dx in his 96s  . Stroke Father   . Congestive Heart Failure Father   . Colon cancer Maternal Grandfather   . Esophageal cancer Brother   . Breast cancer Sister   . Diabetes Other        Aunt  . Colon cancer Maternal Uncle        2  . Hepatitis C Brother   . Rectal cancer Neg Hx   . Stomach cancer Neg Hx     SOCIAL  HISTORY: Social History   Socioeconomic History  . Marital status: Divorced    Spouse name: Not on file  . Number of children: 2  . Years of education: 77  . Highest education level: High school graduate  Occupational History  . Occupation: Retired, post Ecologist: RETIRED  Social Needs  . Financial resource strain: Not on file  . Food insecurity:    Worry: Not on file    Inability: Not on file  . Transportation needs:    Medical: Not on file    Non-medical: Not on file  Tobacco Use  . Smoking status: Former Smoker    Packs/day: 0.50    Years: 10.00    Pack years: 5.00    Last attempt to quit: 02/09/1975    Years since quitting: 42.9  . Smokeless tobacco: Never Used  . Tobacco comment: started at age 68.   quit in the 65s.  Substance and Sexual Activity  . Alcohol use: Yes    Alcohol/week: 0.0 standard drinks    Comment: 2 glasses of wine per year  . Drug use: No    Comment: CBD and hemp OIL   . Sexual activity: Not Currently    Partners: Male  Lifestyle  . Physical activity:    Days per week: Not on file    Minutes per session: Not on file  . Stress: Not on file  Relationships  . Social connections:    Talks on phone: Not on file    Gets together: Not on file    Attends religious service: Not on file    Active member of club or organization: Not on file    Attends meetings of clubs or organizations: Not on file    Relationship status: Not on file  . Intimate partner violence:    Fear of current or ex partner: Not on file    Emotionally abused: Not on file    Physically abused: Not on file    Forced sexual activity: Not on file  Other Topics Concern  . Not on file  Social History Narrative   Son lives with her.   Right-handed.   2 soft drinks per day.     PHYSICAL EXAM   Vitals:   12/29/17 1346  BP: 131/75  Pulse: 86  Weight: 241  lb 8 oz (109.5 kg)  Height: _0  (1.676 m)    Not recorded      Body mass index is 38.98  kg/m.  PHYSICAL EXAMNIATION:  Gen: NAD, conversant, well nourised, obese, well groomed                     Cardiovascular: Regular rate rhythm, no peripheral edema, warm, nontender. Eyes: Conjunctivae clear without exudates or hemorrhage Neck: Supple, no carotid bruits. Pulmonary: Clear to auscultation bilaterally   NEUROLOGICAL EXAM:  MENTAL STATUS: Speech:    Speech is normal; fluent and spontaneous with normal comprehension.  Cognition:     Orientation to time, place and person     Normal recent and remote memory     Normal Attention span and concentration     Normal Language, naming, repeating,spontaneous speech     Fund of knowledge   CRANIAL NERVES: CN II: Visual fields are full to confrontation.  Pupils are round equal and briskly reactive to light. CN III, IV, VI: extraocular movement are normal. No ptosis.  No nystagmus noted. CN V: Facial sensation is intact to pinprick in all 3 divisions bilaterally. Corneal responses are intact.  CN VII: Face is symmetric with normal eye closure and smile. CN VIII: Hearing is normal to rubbing fingers CN IX, X: Palate elevates symmetrically. Phonation is normal. CN XI: Head turning and shoulder shrug are intact CN XII: Tongue is midline with normal movements and no atrophy.  MOTOR: There is no pronator drift of out-stretched arms. Muscle bulk and tone are normal. Muscle strength is normal.  REFLEXES: Reflexes are 2+ and symmetric at the biceps, triceps, knees, and ankles. Plantar responses are flexor.  SENSORY: Intact to light touch, pinprick, positional sensation and vibratory sensation are intact in fingers and toes.  COORDINATION: Rapid alternating movements and fine finger movements are intact. There is no dysmetria on finger-to-nose and heel-knee-shin.    GAIT/STANCE: She needs pushed up to get up from seated position, wide-based, unsteady, could not perform tandem walking  DIAGNOSTIC DATA (LABS, IMAGING, TESTING) - I  reviewed patient records, labs, notes, testing and imaging myself where available.   ASSESSMENT AND PLAN  ZULLY FRANE is a 72 y.o. female   Dizziness in the standing position  Today's orthostatic blood pressure evaluation demonstrates significant elevation of heart rate upon standing, 30 beats per minutes, with no significant blood pressure change, most consistent with positional orthostatic tachycardia, also worsened by her congestive heart failure, obesity, deconditioning, sedentary lifestyle also contribute.  I have referred her to physical therapy,  Encouraged her to increase water intake   Face to face time was 25 minutes, greater than 50% of the time was spent in counseling and coordination of care with the patient.    Marcial Pacas, M.D. Ph.D.  [facility] [address], [place], [place] [address] Ph: [phone] Fax: [phone]  CC:  Colon Branch, MD

## 2017-12-30 ENCOUNTER — Ambulatory Visit (INDEPENDENT_AMBULATORY_CARE_PROVIDER_SITE_OTHER): Payer: Medicare Other | Admitting: Psychology

## 2017-12-30 DIAGNOSIS — F332 Major depressive disorder, recurrent severe without psychotic features: Secondary | ICD-10-CM | POA: Diagnosis not present

## 2017-12-31 LAB — PROTEIN ELECTROPHORESIS
A/G Ratio: 1.2 (ref 0.7–1.7)
Albumin ELP: 3.7 g/dL (ref 2.9–4.4)
Alpha 1: 0.3 g/dL (ref 0.0–0.4)
Alpha 2: 0.6 g/dL (ref 0.4–1.0)
Beta: 1.4 g/dL — ABNORMAL HIGH (ref 0.7–1.3)
Gamma Globulin: 0.8 g/dL (ref 0.4–1.8)
Globulin, Total: 3.1 g/dL (ref 2.2–3.9)
Total Protein: 6.8 g/dL (ref 6.0–8.5)

## 2017-12-31 LAB — HEMOGLOBIN A1C
Est. average glucose Bld gHb Est-mCnc: 117 mg/dL
Hgb A1c MFr Bld: 5.7 % — ABNORMAL HIGH (ref 4.8–5.6)

## 2017-12-31 LAB — FERRITIN: Ferritin: 44 ng/mL (ref 15–150)

## 2017-12-31 LAB — VITAMIN B12: Vitamin B-12: 560 pg/mL (ref 232–1245)

## 2017-12-31 LAB — RPR: RPR Ser Ql: NONREACTIVE

## 2018-01-02 DIAGNOSIS — M47812 Spondylosis without myelopathy or radiculopathy, cervical region: Secondary | ICD-10-CM | POA: Diagnosis not present

## 2018-01-02 DIAGNOSIS — M25511 Pain in right shoulder: Secondary | ICD-10-CM | POA: Diagnosis not present

## 2018-01-10 ENCOUNTER — Encounter: Payer: Self-pay | Admitting: Physician Assistant

## 2018-01-10 ENCOUNTER — Ambulatory Visit (INDEPENDENT_AMBULATORY_CARE_PROVIDER_SITE_OTHER): Payer: Medicare Other | Admitting: Physician Assistant

## 2018-01-10 ENCOUNTER — Other Ambulatory Visit: Payer: Self-pay | Admitting: Gastroenterology

## 2018-01-10 VITALS — BP 102/64 | HR 88 | Ht 66.0 in | Wt 242.2 lb

## 2018-01-10 DIAGNOSIS — I428 Other cardiomyopathies: Secondary | ICD-10-CM

## 2018-01-10 DIAGNOSIS — I5042 Chronic combined systolic (congestive) and diastolic (congestive) heart failure: Secondary | ICD-10-CM

## 2018-01-10 DIAGNOSIS — I10 Essential (primary) hypertension: Secondary | ICD-10-CM

## 2018-01-10 DIAGNOSIS — R42 Dizziness and giddiness: Secondary | ICD-10-CM | POA: Diagnosis not present

## 2018-01-10 MED ORDER — SACUBITRIL-VALSARTAN 49-51 MG PO TABS: 1.0000 | ORAL_TABLET | Freq: Two times a day (BID) | ORAL | 11 refills | Status: DC

## 2018-01-10 NOTE — Progress Notes (Signed)
Cardiology Office Note    Date:  01/10/2018   ID:  [name], DOB August 29, 1945, MRN [id number]  PCP:  Colon Branch, MD  Cardiologist: Candee Furbish, MD EPS: None  No chief complaint on file.   History of Present Illness:  [name] is a 72 y.o. female with a history of hypertension, chronic diastolic CHF, anxiety, Barrett's esophagus who was found to have a nonischemic cardiomyopathy LVEF 40 to 45% with inferior and inferior septal hypokinesis on 2D echo 04/2017.  Cardiac cath 05/06/2017 no significant CAD, upper normal elevated left and right heart filling pressures and severely reduced LV function EF 30 to 35%.  Patient was started on Entresto and Lasix was decreased.  I referred her to a nutritionist and weight loss management program and have been titrating her Entresto up.  Insurance would not pay for weight loss program.    She last saw Dr. Marlou Porch 08/25/2017 at which time she was complaining of dizziness and blood pressure was low.  Lasix was stopped.  She was not orthostatic that day and he felt there may be a component of vertigo involved.  Patient has since seen neurology 12/29/2017 further dizziness and she had significant increase in heart rate with standing but no blood pressure change.  She was referred to physical therapy and asked to increase her water intake.  Patient comes in today for follow-up.  She continued to take Lasix daily despite Dr. Marlou Porch asking her to stop it.  She said she did not remember this.  She continues to have dizziness.  When looking back at my notes she was not complaining of dizziness prior to the Entresto increase.  We will try to decrease this to see if it makes a difference.  Still has chronic dyspnea on exertion.  Says she is going to work on weight loss program.   Past Medical History:  Diagnosis Date  . Anemia   . Anxiety   . Barrett's esophagus   . Bilateral carpal tunnel syndrome    Dr. Eddie Dibbles, having injection therapy  . C. difficile  diarrhea 08/01/2012   severe 2014  . Chronic cystitis    Dr. Matilde Sprang  . Chronic lower back pain   . Depression   . Dizziness   . DJD (degenerative joint disease)    bilateral hands; knees  . Family history of anesthesia complication    Mother had severe N/V  . GERD (gastroesophageal reflux disease)   . H/O cardiac catheterization    (-) cath 12-2002  , cath again 2011 (-)  . HTN (hypertension)   . Hyperlipidemia   . Insomnia   . Migraine   . Osteoporosis    pt unsure of this  . RLS (restless legs syndrome)   . Vitamin B 12 deficiency 04/09/2013    Past Surgical History:  Procedure Laterality Date  . Arm surgery Left    "don't remember what they did; arm wasn't broken"  . BILATERAL KNEE ARTHROSCOPY Bilateral   . CARDIAC CATHETERIZATION  01/22/10   clean cath  . CATARACT EXTRACTION W/ INTRAOCULAR LENS  IMPLANT, BILATERAL Bilateral   . CHOLECYSTECTOMY N/A 10/25/2016   Procedure: LAPAROSCOPIC CHOLECYSTECTOMY;  Surgeon: Georganna Skeans, MD;  Location: [facility];  Service: General;  Laterality: N/A;  . COLONOSCOPY W/ POLYPECTOMY    . FOOT SURGERY Bilateral    toenails removed; callus removed on right; hammertoes right"  . Knot     "removed from right neck; not a goiter"  .  LUMBAR DISC SURGERY     L5 S1 anterior fusion  . OOPHORECTOMY    . RIGHT/LEFT HEART CATH AND CORONARY ANGIOGRAPHY N/A 05/06/2017   Procedure: RIGHT/LEFT HEART CATH AND CORONARY ANGIOGRAPHY;  Surgeon: Nelva Bush, MD;  Location: [facility];  Service: Cardiovascular;  Laterality: N/A;  . SHOULDER ARTHROSCOPY Right   . TOTAL KNEE ARTHROPLASTY  10/05/2011   Procedure: TOTAL KNEE ARTHROPLASTY;  Surgeon: Hessie Dibble, MD;  Location: [facility];  Service: Orthopedics;  Laterality: Right;  [name] VAGINAL HYSTERECTOMY     for endometriosis    Current Medications: Current Meds  Medication Sig  . aspirin EC 81 MG tablet Take 81 mg by mouth daily.  . cholecalciferol (VITAMIN D) 1000 units tablet Take 1,000  Units by mouth daily.  . Cyanocobalamin 1000 MCG/ML KIT Inject 1,000 mcg as directed. Inject 1000 mcg into the skin once a month for vitamin b deficiency.  . furosemide (LASIX) 20 MG tablet Take 1 tablet (20 mg total) by mouth as needed.  [name] HYDROcodone-acetaminophen (NORCO) 10-325 MG tablet Take 1 tablet by mouth every 8 (eight) hours as needed.  . meclizine (ANTIVERT) 12.5 MG tablet Take 1-2 tablets (12.5-25 mg total) by mouth 3 (three) times daily as needed for dizziness.  . metoprolol succinate (TOPROL-XL) 50 MG 24 hr tablet Take 1 tablet (50 mg total) by mouth daily. Take with or immediately following a meal.  . nitroGLYCERIN (NITROSTAT) 0.4 MG SL tablet Place 1 tablet (0.4 mg total) under the tongue every 5 (five) minutes as needed for chest pain.  [name] omeprazole (PRILOSEC) 40 MG capsule TAKE 1 CAPSULE BY MOUTH TWICE A DAY  . Propylene Glycol (SYSTANE BALANCE OP) Apply 1 drop to eye as needed (DRY EYES).  [name] rOPINIRole (REQUIP) 1 MG tablet TAKE 1 TABLET (1 MG TOTAL) BY MOUTH 2 (TWO) TIMES DAILY.  . traZODone (DESYREL) 50 MG tablet Take 1-2 tablets (50-100 mg total) by mouth at bedtime.  . [DISCONTINUED] sacubitril-valsartan (ENTRESTO) 97-103 MG Take 1 tablet by mouth 2 (two) times daily.     Allergies:   Dextromethorphan-guaifenesin; Morphine; and Penicillins   Social History   Socioeconomic History  . Marital status: Divorced    Spouse name: Not on file  . Number of children: 2  . Years of education: 10  . Highest education level: High school graduate  Occupational History  . Occupation: Retired, post Ecologist: RETIRED  Social Needs  . Financial resource strain: Not on file  . Food insecurity:    Worry: Not on file    Inability: Not on file  . Transportation needs:    Medical: Not on file    Non-medical: Not on file  Tobacco Use  . Smoking status: Former Smoker    Packs/day: 0.50    Years: 10.00    Pack years: 5.00    Last attempt to quit: 02/09/1975    Years  since quitting: 42.9  . Smokeless tobacco: Never Used  . Tobacco comment: started at age 35.   quit in the 16s.  Substance and Sexual Activity  . Alcohol use: Yes    Alcohol/week: 0.0 standard drinks    Comment: 2 glasses of wine per year  . Drug use: No    Comment: CBD and hemp OIL   . Sexual activity: Not Currently    Partners: Male  Lifestyle  . Physical activity:    Days per week: Not on file    Minutes per session:  Not on file  . Stress: Not on file  Relationships  . Social connections:    Talks on phone: Not on file    Gets together: Not on file    Attends religious service: Not on file    Active member of club or organization: Not on file    Attends meetings of clubs or organizations: Not on file    Relationship status: Not on file  Other Topics Concern  . Not on file  Social History Narrative   Son lives with her.   Right-handed.   2 soft drinks per day.     Family History:  The patient's family history includes Breast cancer in her sister; CAD in her father; Colon cancer in her maternal grandfather and maternal uncle; Congestive Heart Failure in her father; Dementia in her father; Diabetes in her other; Esophageal cancer in her brother; Hepatitis C in her brother; Lung cancer in her mother; Stroke in her father.   ROS:   Please see the history of present illness.    Review of Systems  Constitution: Positive for malaise/fatigue.  HENT: Negative.   Eyes: Negative.   Cardiovascular: Positive for dyspnea on exertion and leg swelling.  Respiratory: Negative.   Hematologic/Lymphatic: Negative.   Musculoskeletal: Negative.  Negative for joint pain.  Gastrointestinal: Positive for constipation, diarrhea and nausea.  Genitourinary: Negative.   Neurological: Positive for headaches.   All other systems reviewed and are negative.   PHYSICAL EXAM:   VS:  BP 102/64   Pulse 88   Ht _0  (1.676 m)   Wt 242 lb 3.2 oz (109.9 kg)   SpO2 95%   BMI 39.09 kg/m     Physical Exam  GEN: Well nourished, well developed, in no acute distress  Neck: no JVD, carotid bruits, or masses Cardiac:RRR; no murmurs, rubs, or gallops  Respiratory:  clear to auscultation bilaterally, normal work of breathing GI: soft, nontender, nondistended, + BS Ext: without cyanosis, clubbing, or edema, Good distal pulses bilaterally Neuro:  Alert and Oriented x 3 Psych: euthymic mood, full affect  Wt Readings from Last 3 Encounters:  01/10/18 242 lb 3.2 oz (109.9 kg)  12/29/17 241 lb 8 oz (109.5 kg)  12/16/17 241 lb 4 oz (109.4 kg)      Studies/Labs Reviewed:   EKG:  EKG is not ordered today.   Recent Labs: 05/05/2017: B Natriuretic Peptide 112.5 05/08/2017: Magnesium 1.8 05/10/2017: ALT 18 09/06/2017: Hemoglobin 12.2; Platelets 219.0 12/16/2017: BUN 15; Creat 0.92; Potassium 4.2; Sodium 139; TSH 1.62   Lipid Panel    Component Value Date/Time   CHOL 148 10/30/2015 1102   TRIG 68.0 10/30/2015 1102   HDL 60.50 10/30/2015 1102   CHOLHDL 2 10/30/2015 1102   VLDL 13.6 10/30/2015 1102   LDLCALC 74 10/30/2015 1102   LDLDIRECT 166.0 04/14/2010 1539    Additional studies/ records that were reviewed today include:   Echo 3/29/2019Study Conclusions   - Procedure narrative: Transthoracic echocardiography. Technically   difficult study with reduced echo windows. Intravenous contrast   (Definity) was administered. - Left ventricle: The cavity size was normal. Wall thickness was   increased in a pattern of moderate LVH. Systolic function was   mildly to moderately reduced. The estimated ejection fraction was   in the range of 40% to 45%. Inferoseptal hypokinesis. Doppler   parameters are consistent with abnormal left ventricular   relaxation (grade 1 diastolic dysfunction). The E/e&' ratio is   between 8-15, suggesting indeterminate LV filling pressure. -  Aortic valve: Sclerosis without stenosis. There was trivial   regurgitation. - Mitral valve: Mildly thickened  leaflets . There was trivial   regurgitation. - Left atrium: The atrium was normal in size. - Inferior vena cava: The vessel was normal in size. The   respirophasic diameter changes were in the normal range (>= 50%),   consistent with normal central venous pressure.   Impressions:   - Technically difficult study. Definity contrast given. Compared to   a prior echo in 2011, LVEF is lower at 40-45% with predominant   inferior and inferoseptal hypokinesis.   Cardiac cath 3/29/2019Conclusions: 1. No angiographically significant coronary artery disease. 2. Moderately to severely reduced left ventricular contraction (LVEF 30-35%), consistent with non-ischemic cardiomyopathy. 3. Upper normal to mildly elevated left heart, right heart, and pulmonary artery pressures. 4. Low normal to mildly decreased cardiac output/index.   Recommendations: 1. Optimize evidence-based heart failure therapy. 2. Primary prevention of coronary artery disease.   Nelva Bush, MD [facility] Pager: [phone]          ASSESSMENT:    1. Chronic combined systolic and diastolic CHF (congestive heart failure) ([place])   2. Nonischemic cardiomyopathy ([place])   3. Essential hypertension   4. Dizziness   5. Morbid obesity ([place])      PLAN:  In order of problems listed above:  Chronic combined systolic and diastolic CHF EF 40 to 20% on echo, 30 to 35% on cath.  Heart failure is well compensated.  She has had dizziness and diagnosed with vertigo but seems to have worsened since Entresto titrated up.  We will decrease in test dose to 49/51 mg twice daily and have her take Lasix as needed.  I will see her back in 1 month.  2 g sodium diet.  Nonischemic cardiomyopathy no significant CAD on cath 04/2017  Essential hypertension blood pressure running on the low side  Dizziness diagnosed with vertigo and to start physical therapy.  A lot of these problems started with titration of Entresto up.  Will decrease  to 49/51 mg twice daily and have her take Lasix as needed.  I will see her back in 1 month.  Morbid obesity weight loss program once again discussed with patient.    Medication Adjustments/Labs and Tests Ordered: Current medicines are reviewed at length with the patient today.  Concerns regarding medicines are outlined above.  Medication changes, Labs and Tests ordered today are listed in the Patient Instructions below. Patient Instructions  Medication Instructions:  Your physician has recommended you make the following change in your medication:   DECREASE: entresto to 49-51 mg twice a day  TAKE lasix 20 mg AS NEEDED   If you need a refill on your cardiac medications before your next appointment, please call your pharmacy.   Lab work: None Ordered  If you have labs (blood work) drawn today and your tests are completely normal, you will receive your results only by: [name] MyChart Message (if you have MyChart) OR . A paper copy in the mail If you have any lab test that is abnormal or we need to change your treatment, we will call you to review the results.  Testing/Procedures: None ordered  Follow-Up: . Follow up with Ermalinda Barrios, PA in January 2020  Any Other Special Instructions Will Be Listed Below (If Applicable).       Signed, Ermalinda Barrios, PA-C  01/10/2018 1:17 PM    [facility] [place], [place]  [address] Phone: [phone]; Fax: [phone]    

## 2018-01-10 NOTE — Patient Instructions (Signed)
Medication Instructions:  Your physician has recommended you make the following change in your medication:   DECREASE: entresto to 49-51 mg twice a day  TAKE lasix 20 mg AS NEEDED   If you need a refill on your cardiac medications before your next appointment, please call your pharmacy.   Lab work: None Ordered  If you have labs (blood work) drawn today and your tests are completely normal, you will receive your results only by: [name] MyChart Message (if you have MyChart) OR . A paper copy in the mail If you have any lab test that is abnormal or we need to change your treatment, we will call you to review the results.  Testing/Procedures: None ordered  Follow-Up: . Follow up with Ermalinda Barrios, PA in January 2020  Any Other Special Instructions Will Be Listed Below (If Applicable).

## 2018-01-11 DIAGNOSIS — R202 Paresthesia of skin: Secondary | ICD-10-CM | POA: Diagnosis not present

## 2018-01-11 DIAGNOSIS — R269 Unspecified abnormalities of gait and mobility: Secondary | ICD-10-CM | POA: Diagnosis not present

## 2018-01-13 ENCOUNTER — Ambulatory Visit (INDEPENDENT_AMBULATORY_CARE_PROVIDER_SITE_OTHER): Payer: Medicare Other | Admitting: Psychology

## 2018-01-13 DIAGNOSIS — F332 Major depressive disorder, recurrent severe without psychotic features: Secondary | ICD-10-CM

## 2018-01-16 ENCOUNTER — Ambulatory Visit: Payer: Self-pay | Admitting: Gastroenterology

## 2018-01-17 ENCOUNTER — Other Ambulatory Visit: Payer: Self-pay

## 2018-01-17 DIAGNOSIS — R269 Unspecified abnormalities of gait and mobility: Secondary | ICD-10-CM | POA: Diagnosis not present

## 2018-01-17 DIAGNOSIS — R202 Paresthesia of skin: Secondary | ICD-10-CM | POA: Diagnosis not present

## 2018-01-17 NOTE — Patient Outreach (Signed)
[facility]) Care Management  01/17/2018  [name] [facility] 12-Jun-1945 [id number]   Medication Adherence call to Mrs. [name] left a message for patient to call back patient is due on Simvastatin 40 mg. Mrs. [name] is showing past due under [place].   [facility] Management Direct Dial [phone]  Fax [phone] [email or web address]

## 2018-01-24 DIAGNOSIS — R269 Unspecified abnormalities of gait and mobility: Secondary | ICD-10-CM | POA: Diagnosis not present

## 2018-01-24 DIAGNOSIS — R202 Paresthesia of skin: Secondary | ICD-10-CM | POA: Diagnosis not present

## 2018-01-27 ENCOUNTER — Ambulatory Visit (INDEPENDENT_AMBULATORY_CARE_PROVIDER_SITE_OTHER): Payer: Medicare Other | Admitting: Psychology

## 2018-01-27 DIAGNOSIS — F332 Major depressive disorder, recurrent severe without psychotic features: Secondary | ICD-10-CM | POA: Diagnosis not present

## 2018-02-06 ENCOUNTER — Ambulatory Visit: Payer: Self-pay | Admitting: Internal Medicine

## 2018-02-10 ENCOUNTER — Ambulatory Visit (INDEPENDENT_AMBULATORY_CARE_PROVIDER_SITE_OTHER): Payer: Medicare Other | Admitting: Psychology

## 2018-02-10 DIAGNOSIS — F332 Major depressive disorder, recurrent severe without psychotic features: Secondary | ICD-10-CM

## 2018-02-14 ENCOUNTER — Encounter: Payer: Self-pay | Admitting: Internal Medicine

## 2018-02-14 ENCOUNTER — Ambulatory Visit (INDEPENDENT_AMBULATORY_CARE_PROVIDER_SITE_OTHER): Payer: Medicare Other | Admitting: Internal Medicine

## 2018-02-14 VITALS — BP 138/84 | HR 59 | Temp 97.8°F | Resp 16 | Ht 66.0 in | Wt 246.5 lb

## 2018-02-14 DIAGNOSIS — F32A Depression, unspecified: Secondary | ICD-10-CM

## 2018-02-14 DIAGNOSIS — R194 Change in bowel habit: Secondary | ICD-10-CM | POA: Diagnosis not present

## 2018-02-14 DIAGNOSIS — F419 Anxiety disorder, unspecified: Secondary | ICD-10-CM | POA: Diagnosis not present

## 2018-02-14 DIAGNOSIS — R42 Dizziness and giddiness: Secondary | ICD-10-CM | POA: Diagnosis not present

## 2018-02-14 DIAGNOSIS — Z0189 Encounter for other specified special examinations: Secondary | ICD-10-CM | POA: Diagnosis not present

## 2018-02-14 DIAGNOSIS — F329 Major depressive disorder, single episode, unspecified: Secondary | ICD-10-CM

## 2018-02-14 DIAGNOSIS — M797 Fibromyalgia: Secondary | ICD-10-CM

## 2018-02-14 MED ORDER — ROPINIROLE HCL 1 MG PO TABS: 1.0000 mg | ORAL_TABLET | Freq: Two times a day (BID) | ORAL | 3 refills | Status: DC

## 2018-02-14 NOTE — Progress Notes (Signed)
Pre visit review using our clinic review tool, if applicable. No additional management support is needed unless otherwise documented below in the visit note. 

## 2018-02-14 NOTE — Progress Notes (Addendum)
Subjective:    Patient ID: [name], female    DOB: 1945-06-04, 73 y.o.   MRN: [id number]  DOS:  02/14/2018 Type of visit - description: f/u Follow-up from previous visit She continue with headaches on and off Dizziness is slightly better Went to see orthopedic doctor for her MSK issues. Has not seen GI just yet. In addition to all the symptoms she c/o  the last time she also reports that has chronic fatigue gradually getting worse and she is very sleepy all the time.    Review of Systems   Past Medical History:  Diagnosis Date  . Anemia   . Anxiety   . Barrett's esophagus   . Bilateral carpal tunnel syndrome    Dr. Eddie Dibbles, having injection therapy  . C. difficile diarrhea 08/01/2012   severe 2014  . Chronic cystitis    Dr. Matilde Sprang  . Chronic lower back pain   . Depression   . Dizziness   . DJD (degenerative joint disease)    bilateral hands; knees  . Family history of anesthesia complication    Mother had severe N/V  . GERD (gastroesophageal reflux disease)   . H/O cardiac catheterization    (-) cath 12-2002  , cath again 2011 (-)  . HTN (hypertension)   . Hyperlipidemia   . Insomnia   . Migraine   . Osteoporosis    pt unsure of this  . RLS (restless legs syndrome)   . Vitamin B 12 deficiency 04/09/2013    Past Surgical History:  Procedure Laterality Date  . Arm surgery Left    "don't remember what they did; arm wasn't broken"  . BILATERAL KNEE ARTHROSCOPY Bilateral   . CARDIAC CATHETERIZATION  01/22/10   clean cath  . CATARACT EXTRACTION W/ INTRAOCULAR LENS  IMPLANT, BILATERAL Bilateral   . CHOLECYSTECTOMY N/A 10/25/2016   Procedure: LAPAROSCOPIC CHOLECYSTECTOMY;  Surgeon: Georganna Skeans, MD;  Location: [facility];  Service: General;  Laterality: N/A;  . COLONOSCOPY W/ POLYPECTOMY    . FOOT SURGERY Bilateral    toenails removed; callus removed on right; hammertoes right"  . Knot     "removed from right neck; not a goiter"  . LUMBAR DISC SURGERY     L5  S1 anterior fusion  . OOPHORECTOMY    . RIGHT/LEFT HEART CATH AND CORONARY ANGIOGRAPHY N/A 05/06/2017   Procedure: RIGHT/LEFT HEART CATH AND CORONARY ANGIOGRAPHY;  Surgeon: Nelva Bush, MD;  Location: [facility];  Service: Cardiovascular;  Laterality: N/A;  . SHOULDER ARTHROSCOPY Right   . TOTAL KNEE ARTHROPLASTY  10/05/2011   Procedure: TOTAL KNEE ARTHROPLASTY;  Surgeon: Hessie Dibble, MD;  Location: [facility];  Service: Orthopedics;  Laterality: Right;  [name] VAGINAL HYSTERECTOMY     for endometriosis    Social History   Socioeconomic History  . Marital status: Divorced    Spouse name: Not on file  . Number of children: 2  . Years of education: 38  . Highest education level: High school graduate  Occupational History  . Occupation: Retired, post Ecologist: RETIRED  Social Needs  . Financial resource strain: Not on file  . Food insecurity:    Worry: Not on file    Inability: Not on file  . Transportation needs:    Medical: Not on file    Non-medical: Not on file  Tobacco Use  . Smoking status: Former Smoker    Packs/day: 0.50    Years: 10.00  Pack years: 5.00    Last attempt to quit: 02/09/1975    Years since quitting: 43.0  . Smokeless tobacco: Never Used  . Tobacco comment: started at age 45.   quit in the 29s.  Substance and Sexual Activity  . Alcohol use: Yes    Alcohol/week: 0.0 standard drinks    Comment: 2 glasses of wine per year  . Drug use: No    Comment: CBD and hemp OIL   . Sexual activity: Not Currently    Partners: Male  Lifestyle  . Physical activity:    Days per week: Not on file    Minutes per session: Not on file  . Stress: Not on file  Relationships  . Social connections:    Talks on phone: Not on file    Gets together: Not on file    Attends religious service: Not on file    Active member of club or organization: Not on file    Attends meetings of clubs or organizations: Not on file    Relationship status: Not on file    . Intimate partner violence:    Fear of current or ex partner: Not on file    Emotionally abused: Not on file    Physically abused: Not on file    Forced sexual activity: Not on file  Other Topics Concern  . Not on file  Social History Narrative   Son lives with her.   Right-handed.   2 soft drinks per day.      Allergies as of 02/14/2018      Reactions   Dextromethorphan-guaifenesin Nausea And Vomiting   Morphine Nausea And Vomiting   Penicillins Rash   "> 30 years ago; best I can remember it was just a light rash on my arm"      Medication List       Accurate as of February 14, 2018  1:01 PM. Always use your most recent med list.        aspirin EC 81 MG tablet Take 81 mg by mouth daily.   cholecalciferol 1000 units tablet Commonly known as:  VITAMIN D Take 1,000 Units by mouth daily.   Cyanocobalamin 1000 MCG/ML Kit Inject 1,000 mcg as directed. Inject 1000 mcg into the skin once a month for vitamin b deficiency.   furosemide 20 MG tablet Commonly known as:  LASIX Take 1 tablet (20 mg total) by mouth as needed.   HYDROcodone-acetaminophen 10-325 MG tablet Commonly known as:  NORCO Take 1 tablet by mouth every 8 (eight) hours as needed.   meclizine 12.5 MG tablet Commonly known as:  ANTIVERT Take 1-2 tablets (12.5-25 mg total) by mouth 3 (three) times daily as needed for dizziness.   metoprolol succinate 50 MG 24 hr tablet Commonly known as:  TOPROL-XL Take 1 tablet (50 mg total) by mouth daily. Take with or immediately following a meal.   nitroGLYCERIN 0.4 MG SL tablet Commonly known as:  NITROSTAT Place 1 tablet (0.4 mg total) under the tongue every 5 (five) minutes as needed for chest pain.   omeprazole 40 MG capsule Commonly known as:  PRILOSEC TAKE 1 CAPSULE BY MOUTH TWICE A DAY   rOPINIRole 1 MG tablet Commonly known as:  REQUIP TAKE 1 TABLET (1 MG TOTAL) BY MOUTH 2 (TWO) TIMES DAILY.   sacubitril-valsartan 49-51 MG Commonly known as:   ENTRESTO Take 1 tablet by mouth 2 (two) times daily.   SYSTANE BALANCE OP Apply 1 drop to eye as needed (  DRY EYES).   traZODone 50 MG tablet Commonly known as:  DESYREL Take 1-2 tablets (50-100 mg total) by mouth at bedtime.           Objective:   Physical Exam BP 138/84 (BP Location: Left Arm, Patient Position: Sitting, Cuff Size: Normal)   Pulse (!) 59   Temp 97.8 F (36.6 C) (Oral)   Resp 16   Ht '5\' 6"'  (1.676 m)   Wt 246 lb 8 oz (111.8 kg)   SpO2 99%   BMI 39.79 kg/m   General:   Well developed, NAD, BMI noted. HEENT:  Normocephalic . Face symmetric, atraumatic Skin: Not pale. Not jaundice Neurologic:  alert & oriented X3.  Speech normal, gait appropriate for age and unassisted Psych--  Cognition and judgment appear intact.  Cooperative with normal attention span and concentration.  Behavior appropriate. No anxious or depressed appearing.      Assessment     Assessment HTN Hyperlipidemia-  Pravachol, Crestor: Myalgias. Intolerant to Zetia (joint aches), see OV  06/2016  Depression, Anxiety (on clonazepam), insomnia (on trazodone):  depression x many  years, remotely on zoloft and other meds per psych, I rx lexapro 2015, then was rx effexor x a while Morbid obesity CV: CHF/ non ischemic cardiomyopathy dx 04-2017; cath normal coronaries, EF ~ 30% Carotid artery disease: 1 to 39% bilateral carotids 09-2016, start aspirin per neurology GI:  --GERD, Barrett's esophagus, EGD 05-2015, next per GI --h/o persistent C. difficile diarrhea 2014 B12 deficiency Osteopenia: Dexa 2015 showed osteopenia, T score -1.9 (10/2016): Rx cs, vit D PULM: --NPSG 2008:  No osa, PLMS 4/hr with arousal and awakening --RLS - Requip  Migraines, f/u [facility] -- off topamax as off 06-2016 MSK: --Back pain, chronic: pain meds rx by ortho, Workers comp MD @ [employer] pain mngmt [place] --DJD,CTS B ([facility]) --Fibromyalgia.  See note from 02/14/2018 OAB, LUTS-- on myrbetriq Dr  [place]  Raynaud phenomena---- DX 07/2017  PLAN Headache: Dizziness: Saw neurology on follow-up, they are recommending a nerve conduction study for paresthesias. Also she was noted to have positional orthostatic tachycardia.  There is recommend physical therapy. Plan: Continue working with neurology MSK: See last visit, she continued generalized aches, sed rate, ANA, RF normal.  CRP and CKs were  slightly elevated Saw Ortho, she was diagnosed with cervical spondylosis, right shoulder rotator cuff and a number of other MSK/mechanical conditions. In addition to all of these symptoms reported last visit she again brought the issue of chronic fatigue getting worse and feeling sleepy.  Epworth sleepiness scale: 16 She had a sleep study in 2008, negative for OSA but positive for RLS. Plan: Continue working with orthopedic surgery  Fatigue: Refer back to Dr. Halford Chessman, repeat sleep study?. Fibromyalgia: Taking into account the confluence of multiple MSK issues, chronic fatigue, anxiety, depression I think the patient has fibromyalgia although other factors are likely also playing a role.  Referral to one of the local rheumatologist failed, we are trying a referral to  Dr. Turner Daniels. Ideally, I would like a rheumatologist to confirm my suspicion particularly because she has a Raynaud phenomena and slt increase CKs and CRPs.  I just like to be sure she does not have a autoimmune disease   Plan: Educated patient about fibromyalgia, literature printed.  Once the patient is more familiar with the diagnosis will discuss additional treatment options  Change in bowel habits: Will see GI soon. RTC 3 months.  Today, I spent more than 30   min  with the patient: >50% of the time counseling regards the new diagnosis of fibromyalgia, also reviewing the chart and labs ordered by other providers and  -coordinating his care

## 2018-02-14 NOTE — Patient Instructions (Addendum)
GO TO THE FRONT DESK Schedule your next appointment for a checkup in 3 months   Myofascial Pain Syndrome and Fibromyalgia Myofascial pain syndrome and fibromyalgia are both pain disorders. This pain may be felt mainly in your muscles.  Myofascial pain syndrome: ? Always has tender points in the muscle that will cause pain when pressed (trigger points). The pain may come and go. ? Usually affects your neck, upper back, and shoulder areas. The pain often radiates into your arms and hands.  Fibromyalgia: ? Has muscle pains and tenderness that come and go. ? Is often associated with fatigue and sleep problems. ? Has trigger points. ? Tends to be long-lasting (chronic), but is not life-threatening. Fibromyalgia and myofascial pain syndrome are not the same. However, they often occur together. If you have both conditions, each can make the other worse. Both are common and can cause enough pain and fatigue to make day-to-day activities difficult. Both can be hard to diagnose because their symptoms are common in many other conditions. What are the causes? The exact causes of these conditions are not known. What increases the risk? You are more likely to develop this condition if:  You have a family history of the condition.  You have certain triggers, such as: ? Spine disorders. ? An injury (trauma) or other physical stressors. ? Being under a lot of stress. ? Medical conditions such as osteoarthritis, rheumatoid arthritis, or lupus. What are the signs or symptoms? Fibromyalgia The main symptom of fibromyalgia is widespread pain and tenderness in your muscles. Pain is sometimes described as stabbing, shooting, or burning. You may also have:  Tingling or numbness.  Sleep problems and fatigue.  Problems with attention and concentration (fibro fog). Other symptoms may include:  Bowel and bladder problems.  Headaches.  Visual problems.  Problems with odors and  noises.  Depression or mood changes.  Painful menstrual periods (dysmenorrhea).  Dry skin or eyes. These symptoms can vary over time. Myofascial pain syndrome Symptoms of myofascial pain syndrome include:  Tight, ropy bands of muscle.  Uncomfortable sensations in muscle areas. These may include aching, cramping, burning, numbness, tingling, and weakness.  Difficulty moving certain parts of the body freely (poor range of motion). How is this diagnosed? This condition may be diagnosed by your symptoms and medical history. You will also have a physical exam. In general:  Fibromyalgia is diagnosed if you have pain, fatigue, and other symptoms for more than 3 months, and symptoms cannot be explained by another condition.  Myofascial pain syndrome is diagnosed if you have trigger points in your muscles, and those trigger points are tender and cause pain elsewhere in your body (referred pain). How is this treated? Treatment for these conditions depends on the type that you have.  For fibromyalgia: ? Pain medicines, such as NSAIDs. ? Medicines for treating depression. ? Medicines for treating seizures. ? Medicines that relax the muscles.  For myofascial pain: ? Pain medicines, such as NSAIDs. ? Cooling and stretching of muscles. ? Trigger point injections. ? Sound wave (ultrasound) treatments to stimulate muscles. Treating these conditions often requires a team of health care providers. These may include:  Your primary care provider.  Physical therapist.  Complementary health care providers, such as massage therapists or acupuncturists.  Psychiatrist for cognitive behavioral therapy. Follow these instructions at home: Medicines  Take over-the-counter and prescription medicines only as told by your health care provider.  Do not drive or use heavy machinery while taking prescription pain medicine.  If you are taking prescription pain medicine, take actions to prevent or  treat constipation. Your health care provider may recommend that you: ? Drink enough fluid to keep your urine pale yellow. ? Eat foods that are high in fiber, such as fresh fruits and vegetables, whole grains, and beans. ? Limit foods that are high in fat and processed sugars, such as fried or sweet foods. ? Take an over-the-counter or prescription medicine for constipation. Lifestyle   Exercise as directed by your health care provider or physical therapist.  Practice relaxation techniques to control your stress. You may want to try: ? Biofeedback. ? Visual imagery. ? Hypnosis. ? Muscle relaxation. ? Yoga. ? Meditation.  Maintain a healthy lifestyle. This includes eating a healthy diet and getting enough sleep.  Do not use any products that contain nicotine or tobacco, such as cigarettes and e-cigarettes. If you need help quitting, ask your health care provider. General instructions  Talk to your health care provider about complementary treatments, such as acupuncture or massage.  Consider joining a support group with others who are diagnosed with this condition.  Do not do activities that stress or strain your muscles. This includes repetitive motions and heavy lifting.  Keep all follow-up visits as told by your health care provider. This is important. Where to find more information  National Fibromyalgia Association: www.fmaware.[place]: www.arthritis.org  American Chronic Pain Association: www.theacpa.org Contact a health care provider if:  You have new symptoms.  Your symptoms get worse or your pain is severe.  You have side effects from your medicines.  You have trouble sleeping.  Your condition is causing depression or anxiety. Summary  Myofascial pain syndrome and fibromyalgia are pain disorders.  Myofascial pain syndrome has tender points in the muscle that will cause pain when pressed (trigger points). Fibromyalgia also has muscle pains  and tenderness that come and go, but this condition is often associated with fatigue and sleep disturbances.  Fibromyalgia and myofascial pain syndrome are not the same but often occur together, causing pain and fatigue that make day-to-day activities difficult.  Treatment for fibromyalgia includes taking medicines to relax the muscles and medicines for pain, depression, or seizures. Treatment for myofascial pain syndrome includes taking medicines for pain, cooling and stretching of muscles, and injecting medicines into trigger points.  Follow your health care provider's instructions for taking medicines and maintaining a healthy lifestyle. This information is not intended to replace advice given to you by your health care provider. Make sure you discuss any questions you have with your health care provider. Document Released: 01/25/2005 Document Revised: 02/09/2017 Document Reviewed: 02/09/2017 Elsevier Interactive Patient Education  2019 [employer].

## 2018-02-15 ENCOUNTER — Encounter: Payer: Self-pay | Admitting: Physician Assistant

## 2018-02-15 ENCOUNTER — Ambulatory Visit (INDEPENDENT_AMBULATORY_CARE_PROVIDER_SITE_OTHER): Payer: Medicare Other | Admitting: Physician Assistant

## 2018-02-15 VITALS — BP 130/94 | HR 106 | Ht 66.0 in | Wt 245.8 lb

## 2018-02-15 DIAGNOSIS — I428 Other cardiomyopathies: Secondary | ICD-10-CM | POA: Diagnosis not present

## 2018-02-15 DIAGNOSIS — I10 Essential (primary) hypertension: Secondary | ICD-10-CM | POA: Diagnosis not present

## 2018-02-15 DIAGNOSIS — I5042 Chronic combined systolic (congestive) and diastolic (congestive) heart failure: Secondary | ICD-10-CM

## 2018-02-15 DIAGNOSIS — M797 Fibromyalgia: Secondary | ICD-10-CM | POA: Insufficient documentation

## 2018-02-15 DIAGNOSIS — R42 Dizziness and giddiness: Secondary | ICD-10-CM | POA: Diagnosis not present

## 2018-02-15 NOTE — Patient Instructions (Addendum)
Medication Instructions:  Your physician recommends that you continue on your current medications as directed. Please refer to the Current Medication list given to you today.  If you need a refill on your cardiac medications before your next appointment, please call your pharmacy.   Lab work: None Ordered  If you have labs (blood work) drawn today and your tests are completely normal, you will receive your results only by: [name] MyChart Message (if you have MyChart) OR . A paper copy in the mail If you have any lab test that is abnormal or we need to change your treatment, we will call you to review the results.  Testing/Procedures: None ordered  Follow-Up: . Follow up with Dr. Marlou Porch on 05/25/18 at 1:40 PM  Any Other Special Instructions Will Be Listed Below (If Applicable).

## 2018-02-15 NOTE — Progress Notes (Signed)
Cardiology Office Note    Date:  02/15/2018   ID:  [name], DOB 08/19/1945, MRN [id number]  PCP:  Colon Branch, MD  Cardiologist: Candee Furbish, MD EPS: None  No chief complaint on file.   History of Present Illness:  [name] is a 73 y.o. female with a history of hypertension, chronic diastolic CHF, anxiety, Barrett's esophagus who was found to have a nonischemic cardiomyopathy LVEF 40 to 45% with inferior and inferior septal hypokinesis on 2D echo 04/2017.  Cardiac cath 05/06/2017 no significant CAD, upper normal elevated left and right heart filling pressures and severely reduced LV function EF 30 to 35%.  Patient was started on Entresto and Lasix was decreased.  I referred her to a nutritionist and weight loss management program and have been titrating her Entresto up.  Insurance would not pay for weight loss program.    She last saw Dr. Marlou Porch 08/25/2017 at which time she was complaining of dizziness and blood pressure was low. Lasix was stopped. She was not orthostatic that day and he felt there may be a component of vertigo involved. Patient has since seen neurology 12/29/2017 further dizziness and she had significant increase in heart rate with standing but no blood pressure change. She was referred to physical therapy and asked to increase her water intake.   I saw patient 01/10/18 and patient continued to take Lasix despite Dr. Marlou Porch telling her to stop.  Most of her dizziness started around the time her [name] was increased so I decreased it back to 49/51 mg twice daily and changed her Lasix to be PRN.  Patient says dizziness has improved since I decreased Entresto. Still doing PT. Dr. Larose Kells recommends sleep study. Also thinks she has fibromyalgia. Had leg swelling one day and took a lasix and improved quickly.  Occasionally gets short of breath.   Past Medical History:  Diagnosis Date  . Anemia   . Anxiety   . Barrett's esophagus   . Bilateral carpal tunnel syndrome    Dr. Eddie Dibbles, having injection therapy  . C. difficile diarrhea 08/01/2012   severe 2014  . Chronic cystitis    Dr. Matilde Sprang  . Chronic lower back pain   . Depression   . Dizziness   . DJD (degenerative joint disease)    bilateral hands; knees  . Family history of anesthesia complication    Mother had severe N/V  . GERD (gastroesophageal reflux disease)   . H/O cardiac catheterization    (-) cath 12-2002  , cath again 2011 (-)  . HTN (hypertension)   . Hyperlipidemia   . Insomnia   . Migraine   . Osteoporosis    pt unsure of this  . RLS (restless legs syndrome)   . Vitamin B 12 deficiency 04/09/2013    Past Surgical History:  Procedure Laterality Date  . Arm surgery Left    "don't remember what they did; arm wasn't broken"  . BILATERAL KNEE ARTHROSCOPY Bilateral   . CARDIAC CATHETERIZATION  01/22/10   clean cath  . CATARACT EXTRACTION W/ INTRAOCULAR LENS  IMPLANT, BILATERAL Bilateral   . CHOLECYSTECTOMY N/A 10/25/2016   Procedure: LAPAROSCOPIC CHOLECYSTECTOMY;  Surgeon: Georganna Skeans, MD;  Location: [facility];  Service: General;  Laterality: N/A;  . COLONOSCOPY W/ POLYPECTOMY    . FOOT SURGERY Bilateral    toenails removed; callus removed on right; hammertoes right"  . Knot     "removed from right neck; not a goiter"  . LUMBAR  DISC SURGERY     L5 S1 anterior fusion  . OOPHORECTOMY    . RIGHT/LEFT HEART CATH AND CORONARY ANGIOGRAPHY N/A 05/06/2017   Procedure: RIGHT/LEFT HEART CATH AND CORONARY ANGIOGRAPHY;  Surgeon: Nelva Bush, MD;  Location: [facility];  Service: Cardiovascular;  Laterality: N/A;  . SHOULDER ARTHROSCOPY Right   . TOTAL KNEE ARTHROPLASTY  10/05/2011   Procedure: TOTAL KNEE ARTHROPLASTY;  Surgeon: Hessie Dibble, MD;  Location: [facility];  Service: Orthopedics;  Laterality: Right;  [name] VAGINAL HYSTERECTOMY     for endometriosis    Current Medications: Current Meds  Medication Sig  . aspirin EC 81 MG tablet Take 81 mg by mouth daily.  .  cholecalciferol (VITAMIN D) 1000 units tablet Take 1,000 Units by mouth daily.  . Cyanocobalamin 1000 MCG/ML KIT Inject 1,000 mcg as directed. Inject 1000 mcg into the skin once a month for vitamin b deficiency.  . furosemide (LASIX) 20 MG tablet Take 1 tablet (20 mg total) by mouth as needed.  [name] HYDROcodone-acetaminophen (NORCO) 10-325 MG tablet Take 1 tablet by mouth every 8 (eight) hours as needed.  . meclizine (ANTIVERT) 12.5 MG tablet Take 1-2 tablets (12.5-25 mg total) by mouth 3 (three) times daily as needed for dizziness.  . metoprolol succinate (TOPROL-XL) 50 MG 24 hr tablet Take 1 tablet (50 mg total) by mouth daily. Take with or immediately following a meal.  . nitroGLYCERIN (NITROSTAT) 0.4 MG SL tablet Place 1 tablet (0.4 mg total) under the tongue every 5 (five) minutes as needed for chest pain.  [name] omeprazole (PRILOSEC) 40 MG capsule TAKE 1 CAPSULE BY MOUTH TWICE A DAY  . Propylene Glycol (SYSTANE BALANCE OP) Apply 1 drop to eye as needed (DRY EYES).  [name] rOPINIRole (REQUIP) 1 MG tablet Take 1 tablet (1 mg total) by mouth 2 (two) times daily.  . sacubitril-valsartan (ENTRESTO) 49-51 MG Take 1 tablet by mouth 2 (two) times daily.  . traZODone (DESYREL) 50 MG tablet Take 1-2 tablets (50-100 mg total) by mouth at bedtime.     Allergies:   Dextromethorphan-guaifenesin; Morphine; and Penicillins   Social History   Socioeconomic History  . Marital status: Divorced    Spouse name: Not on file  . Number of children: 2  . Years of education: 60  . Highest education level: High school graduate  Occupational History  . Occupation: Retired, post Ecologist: RETIRED  Social Needs  . Financial resource strain: Not on file  . Food insecurity:    Worry: Not on file    Inability: Not on file  . Transportation needs:    Medical: Not on file    Non-medical: Not on file  Tobacco Use  . Smoking status: Former Smoker    Packs/day: 0.50    Years: 10.00    Pack years: 5.00    Last  attempt to quit: 02/09/1975    Years since quitting: 43.0  . Smokeless tobacco: Never Used  . Tobacco comment: started at age 15.   quit in the 29s.  Substance and Sexual Activity  . Alcohol use: Yes    Alcohol/week: 0.0 standard drinks    Comment: 2 glasses of wine per year  . Drug use: No    Comment: CBD and hemp OIL   . Sexual activity: Not Currently    Partners: Male  Lifestyle  . Physical activity:    Days per week: Not on file    Minutes per session: Not on  file  . Stress: Not on file  Relationships  . Social connections:    Talks on phone: Not on file    Gets together: Not on file    Attends religious service: Not on file    Active member of club or organization: Not on file    Attends meetings of clubs or organizations: Not on file    Relationship status: Not on file  Other Topics Concern  . Not on file  Social History Narrative   Son lives with her.   Right-handed.   2 soft drinks per day.     Family History:  The patient's family history includes Breast cancer in her sister; CAD in her father; Colon cancer in her maternal grandfather and maternal uncle; Congestive Heart Failure in her father; Dementia in her father; Diabetes in an other family member; Esophageal cancer in her brother; Hepatitis C in her brother; Lung cancer in her mother; Stroke in her father.   ROS:   Please see the history of present illness.    Review of Systems  Constitution: Positive for malaise/fatigue.  Cardiovascular: Positive for dyspnea on exertion and leg swelling.  Musculoskeletal: Positive for myalgias.  Neurological: Positive for dizziness.  Psychiatric/Behavioral: The patient is nervous/anxious.    All other systems reviewed and are negative.   PHYSICAL EXAM:   VS:  BP (!) 130/94   Pulse (!) 106   Ht '5\' 6"'  (1.676 m)   Wt 245 lb 12.8 oz (111.5 kg)   SpO2 98%   BMI 39.67 kg/m   Physical Exam  GEN: Well nourished, well developed, in no acute distress  Neck: no JVD, carotid  bruits, or masses Cardiac:RRR; no murmurs, rubs, or gallops  Respiratory:  clear to auscultation bilaterally, normal work of breathing GI: soft, nontender, nondistended, + BS Ext: without cyanosis, clubbing, or edema, Good distal pulses bilaterally Neuro:  Alert and Oriented x 3 Psych: euthymic mood, full affect  Wt Readings from Last 3 Encounters:  02/15/18 245 lb 12.8 oz (111.5 kg)  02/14/18 246 lb 8 oz (111.8 kg)  01/10/18 242 lb 3.2 oz (109.9 kg)      Studies/Labs Reviewed:   EKG:  EKG is not ordered today. Recent Labs: 05/05/2017: B Natriuretic Peptide 112.5 05/08/2017: Magnesium 1.8 05/10/2017: ALT 18 09/06/2017: Hemoglobin 12.2; Platelets 219.0 12/16/2017: BUN 15; Creat 0.92; Potassium 4.2; Sodium 139; TSH 1.62   Lipid Panel    Component Value Date/Time   CHOL 148 10/30/2015 1102   TRIG 68.0 10/30/2015 1102   HDL 60.50 10/30/2015 1102   CHOLHDL 2 10/30/2015 1102   VLDL 13.6 10/30/2015 1102   LDLCALC 74 10/30/2015 1102   LDLDIRECT 166.0 04/14/2010 1539    Additional studies/ records that were reviewed today include:  Echo 3/29/2019Study Conclusions   - Procedure narrative: Transthoracic echocardiography. Technically   difficult study with reduced echo windows. Intravenous contrast   (Definity) was administered. - Left ventricle: The cavity size was normal. Wall thickness was   increased in a pattern of moderate LVH. Systolic function was   mildly to moderately reduced. The estimated ejection fraction was   in the range of 40% to 45%. Inferoseptal hypokinesis. Doppler   parameters are consistent with abnormal left ventricular   relaxation (grade 1 diastolic dysfunction). The E/e&' ratio is   between 8-15, suggesting indeterminate LV filling pressure. - Aortic valve: Sclerosis without stenosis. There was trivial   regurgitation. - Mitral valve: Mildly thickened leaflets . There was trivial   regurgitation. -  Left atrium: The atrium was normal in size. - Inferior  vena cava: The vessel was normal in size. The   respirophasic diameter changes were in the normal range (>= 50%),   consistent with normal central venous pressure.   Impressions:   - Technically difficult study. Definity contrast given. Compared to   a prior echo in 2011, LVEF is lower at 40-45% with predominant   inferior and inferoseptal hypokinesis.   Cardiac cath 3/29/2019Conclusions: 1. No angiographically significant coronary artery disease. 2. Moderately to severely reduced left ventricular contraction (LVEF 30-35%), consistent with non-ischemic cardiomyopathy. 3. Upper normal to mildly elevated left heart, right heart, and pulmonary artery pressures. 4. Low normal to mildly decreased cardiac output/index.   Recommendations: 1. Optimize evidence-based heart failure therapy. 2. Primary prevention of coronary artery disease.   Nelva Bush, MD [facility] Pager: [phone]           ASSESSMENT:    1. Chronic combined systolic and diastolic CHF (congestive heart failure) ([place])   2. Dizziness   3. Nonischemic cardiomyopathy ([place])   4. Essential hypertension   5. Morbid obesity ([place])      PLAN:  In order of problems listed above:   Chronic combined systolic and diastolic CHF ejection fraction 40 to 55% on echo 30 to 35% on cath.  Dizziness since Entresto increased so I decreased it down to 49/51 mg twice daily and change Lasix to as needed.  Dizziness has improved.  She does take Lasix when her legs swell where she gets short of breath.  Continue current treatment and follow-up with Dr. Marlou Porch in 3 months.  Dizziness diagnosed with vertigo and doing physical therapy has also improved since decreasing Entresto.  Nonischemic cardiomyopathy no CAD on cath 04/2017  Essential hypertension blood pressure runs low but is a little high today.  Was normal yesterday.  Patient says she will get a blood pressure cuff and keep monitoring it.  Morbid obesity insurance  will not pay for weight loss program.  Sleep study ordered by Dr. Larose Kells     Medication Adjustments/Labs and Tests Ordered: Current medicines are reviewed at length with the patient today.  Concerns regarding medicines are outlined above.  Medication changes, Labs and Tests ordered today are listed in the Patient Instructions below. Patient Instructions  Medication Instructions:  Your physician recommends that you continue on your current medications as directed. Please refer to the Current Medication list given to you today.  If you need a refill on your cardiac medications before your next appointment, please call your pharmacy.   Lab work: None Ordered  If you have labs (blood work) drawn today and your tests are completely normal, you will receive your results only by: [name] MyChart Message (if you have MyChart) OR . A paper copy in the mail If you have any lab test that is abnormal or we need to change your treatment, we will call you to review the results.  Testing/Procedures: None ordered  Follow-Up: . Follow up with Dr. Marlou Porch on 05/25/18 at 1:40 PM  Any Other Special Instructions Will Be Listed Below (If Applicable).       Signed, Ermalinda Barrios, PA-C  02/15/2018 1:25 PM    [facility], [place], [place]  [address] Phone: [phone]; Fax: [phone]

## 2018-02-15 NOTE — Assessment & Plan Note (Addendum)
Headache: Dizziness: Saw neurology on follow-up, they are recommending a nerve conduction study for paresthesias. Also she was noted to have positional orthostatic tachycardia.  There is recommend physical therapy. Plan: Continue working with neurology MSK: See last visit, she continued generalized aches, sed rate, ANA, RF normal.  CRP and CKs were  slightly elevated Saw Ortho, she was diagnosed with cervical spondylosis, right shoulder rotator cuff and a number of other MSK/mechanical conditions. In addition to all of these symptoms reported last visit she again brought the issue of chronic fatigue getting worse and feeling sleepy.  Epworth sleepiness scale: 16 She had a sleep study in 2008, negative for OSA but positive for RLS. Plan: Continue working with orthopedic surgery  Fatigue: Refer back to Dr. Halford Chessman, repeat sleep study?. Fibromyalgia: Taking into account the confluence of multiple MSK issues, chronic fatigue, anxiety, depression I think the patient has fibromyalgia although other factors are likely also playing a role.  Referral to one of the local rheumatologist failed, we are trying a referral to  Dr. Turner Daniels. Ideally, I would like a rheumatologist to confirm my suspicion particularly because she has a Raynaud phenomena and slt increase CKs and CRPs.  I just like to be sure she does not have a autoimmune disease   Plan: Educated patient about fibromyalgia, literature printed.Once the patient is more familiar with the diagnosis will discuss additional treatment options  Change in bowel habits: Will see GI soon. RTC 3 months.

## 2018-02-17 ENCOUNTER — Ambulatory Visit (INDEPENDENT_AMBULATORY_CARE_PROVIDER_SITE_OTHER): Payer: Medicare Other | Admitting: Neurology

## 2018-02-17 ENCOUNTER — Encounter (INDEPENDENT_AMBULATORY_CARE_PROVIDER_SITE_OTHER): Payer: Medicare Other | Admitting: Neurology

## 2018-02-17 DIAGNOSIS — R202 Paresthesia of skin: Secondary | ICD-10-CM | POA: Diagnosis not present

## 2018-02-17 DIAGNOSIS — Z0289 Encounter for other administrative examinations: Secondary | ICD-10-CM

## 2018-02-17 NOTE — Procedures (Signed)
Full Name: [name] Gender: Female MRN #: [id number] Date of Birth: 02-02-1946    Visit Date: 02/17/2018 12:19 Age: 73 Years 98 Months Old Examining Physician: Marcial Pacas, MD  Referring Physician: Marcial Pacas, MD History: 73 year old female presented with bilateral feet paresthesia, dizziness with sudden positional change  Summary of the tests:  Nerve conduction study: Left sural, superficial peroneal sensory responses were within normal limit.  Right sural, superficial peroneal sensory responses showed mildly decreased snap amplitude, she also had right knee replacement with prolonged midline scar, right lateral leg tattoo.  Bilateral tibial motor responses were within normal limits with exception of mildly decreased conduction velocity on the left side.  Right peroneal to EDB motor responses were normal.  Left peroneal to EDB motor response showed normal distal latency C map amplitude at distal stimulation side, absent response on proximal stimulation side, likely due to suboptimal stimulation.  Electromyography: Selected needle examination of right lower extremity muscles showed no significant abnormality  Conclusion: This is a slight abnormal study.  The decreased sensory conduction study on the right lower extremity likely due to technical difficulties from her previous right knee replacement, right lateral leg tatoo.  There is no evidence of peripheral neuropathy or right lumbosacral radiculopathy.   ------------------------------- Marcial Pacas, M.D. Ph.D.  [facility] [place], [place] [address] Tel: [phone] Fax: [phone]        [facility]    Nerve / Sites Muscle Latency Ref. Amplitude Ref. Rel Amp Segments Distance Velocity Ref. Area    ms ms mV mV %  cm m/s m/s mVms  R Peroneal - EDB     Ankle EDB 4.8 ?6.5 7.1 ?2.0 100 Ankle - EDB 9   15.9     Fib head EDB 11.1  6.3  89.4 Fib head - Ankle 31 50 ?44 16.3     Pop fossa EDB 13.4  3.0   47.7 Pop fossa - Fib head 10 44 ?44 8.0         Pop fossa - Ankle      L Peroneal - EDB     Ankle EDB 5.1 ?6.5 4.5 ? there is no evidence of large fiber peripheral neuropathy 0 100 Ankle - EDB 9   13.9     Fib head EDB 11.9  3.7  82.8 Fib head - Ankle 32 47 ?44 13.6     Pop fossa EDB NR  NR  NR Pop fossa - Fib head 10 NR ?44 NR         Pop fossa - Ankle      R Tibial - AH     Ankle AH 4.1 ?5.8 7.4 ?4.0 100 Ankle - AH 9   19.9     Pop fossa AH 12.9  4.7  63.2 Pop fossa - Ankle 36 41 ?41 12.9  L Tibial - AH     Ankle AH 3.7 ?5.8 5.1 ?4.0 100 Ankle - AH 9   16.1     Pop fossa AH 12.8  3.4  66.5 Pop fossa - Ankle 34 37 ?41 10.4             SNC    Nerve / Sites Rec. Site Peak Lat Ref.  Amp Ref. Segments Distance    ms ms V V  cm  R Sural - Ankle (Calf)     Calf Ankle 4.1 ?4.4 4 ?6 Calf - Ankle 14  L Sural - Ankle (Calf)  Calf Ankle 3.5 ?4.4 8 ?6 Calf - Ankle 14  R Superficial peroneal - Ankle     Lat leg Ankle 3.5 ?4.4 5 ?6 Lat leg - Ankle 14  L Superficial peroneal - Ankle     Lat leg Ankle 3.7 ?4.4 9 ?6 Lat leg - Ankle 14              F  Wave    Nerve F Lat Ref.   ms ms  R Tibial - AH 54.2 ?56.0  L Tibial - AH 54.1 ?56.0         EMG full       EMG Summary Table    Spontaneous MUAP Recruitment  Muscle IA Fib PSW Fasc Other Amp Dur. Poly Pattern  R. Tibialis anterior Normal None None None _______ Normal Normal Normal Normal  R. Peroneus longus Normal None None None _______ Normal Normal Normal Normal  R. Gastrocnemius (Medial head) Normal None None None _______ Normal Normal Normal Normal  R. Vastus lateralis Normal None None None _______ Normal Normal Normal Normal

## 2018-02-21 ENCOUNTER — Ambulatory Visit (INDEPENDENT_AMBULATORY_CARE_PROVIDER_SITE_OTHER): Payer: Medicare Other | Admitting: Gastroenterology

## 2018-02-21 ENCOUNTER — Encounter: Payer: Self-pay | Admitting: Gastroenterology

## 2018-02-21 VITALS — BP 130/88 | HR 104 | Ht 66.0 in | Wt 244.0 lb

## 2018-02-21 DIAGNOSIS — K582 Mixed irritable bowel syndrome: Secondary | ICD-10-CM | POA: Diagnosis not present

## 2018-02-21 DIAGNOSIS — K2270 Barrett's esophagus without dysplasia: Secondary | ICD-10-CM

## 2018-02-21 MED ORDER — DICYCLOMINE HCL 10 MG PO CAPS: 10.0000 mg | ORAL_CAPSULE | Freq: Three times a day (TID) | ORAL | 11 refills | Status: DC

## 2018-02-21 MED ORDER — POLYETHYLENE GLYCOL 3350 17 GM/SCOOP PO POWD: 17.0000 g | Freq: Every day | ORAL | 3 refills | Status: DC

## 2018-02-21 NOTE — Patient Instructions (Signed)
We have sent the following medications to your pharmacy for you to pick up at your convenience: dicyclomine.   You can start over the counter Imodium and take as directed per package. Also start over the counter Miralax mixing 17 grams in 8 oz of water daily as needed for constipation.  You will be due for a recall EGD in 05/2018. We will send you a reminder in the mail when it gets closer to that time.  Thank you for choosing me and [facility].  Pricilla Riffle. Dagoberto Ligas., MD., Marval Regal

## 2018-02-21 NOTE — Progress Notes (Signed)
    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  She notes frequent generalized abdominal pain and bloating.  She notes occasional incontinence.  She uses Imodium frequently to control diarrhea which often sends her into a constipation phase.  She has had similar symptoms for many years but they have worsened over the past few months.  She was previously prescribed dicyclomine but does not recall if it was effective.  She underwent laparoscopic cholecystectomy in September 2018.  She states her reflux symptoms have been under very good control since her cholecystectomy. Denies weight loss, change in stool caliber, melena, hematochezia, nausea, vomiting, dysphagia, reflux symptoms, chest pain.  Colonoscopy 12/2014: 1. Two sessile polyps in the sigmoid colon; polypectomies performed with a cold snare (hyperplastic) 2. 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 The examination was otherwise normal  Current Medications, Allergies, Past Medical History, Past Surgical History, Family History and Social History were reviewed in [employer] record.  Physical Exam: General: Well developed, well nourished, no acute distress Head: Normocephalic and atraumatic Eyes:  sclerae anicteric, EOMI Ears: Normal auditory acuity Mouth: No deformity or lesions Lungs: Clear throughout to auscultation Heart: Regular rate and rhythm; no murmurs, rubs or bruits Abdomen: Soft, mild diffuse tenderness and non distended. No masses, hepatosplenomegaly or hernias noted. Normal Bowel sounds Rectal: Not done Musculoskeletal: Symmetrical with no gross deformities  Pulses:  Normal pulses noted Extremities: No clubbing, cyanosis, edema or deformities noted Neurological: Alert oriented x 4, grossly nonfocal Psychological:  Alert and cooperative. Normal mood and affect   Assessment and  Recommendations:  1. 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 Continue omeprazole 40 mg po bid. Antireflux measures. Surveillance EGD due in 05/2018.   2. IBS, constipation, diarrhea, bloating.  She is advised to reduce her use of Imodium.  MiraLAX once daily for constipation.  Begin dicyclomine 10 mg 3 times daily AC.  Advised her to call in 3 to 4 weeks if her symptoms are not under better control.  Avoid foods that trigger symptoms.  If not well controlled will obtain blood work and increase dicyclomine to 20 mg 4 times daily.  3. Depression, anxiety.   4. Fibromyalgia.   5. Status post cholecystectomy in 2018.

## 2018-02-24 ENCOUNTER — Ambulatory Visit (INDEPENDENT_AMBULATORY_CARE_PROVIDER_SITE_OTHER): Payer: Medicare Other | Admitting: Psychology

## 2018-02-24 DIAGNOSIS — F332 Major depressive disorder, recurrent severe without psychotic features: Secondary | ICD-10-CM

## 2018-03-10 ENCOUNTER — Ambulatory Visit (INDEPENDENT_AMBULATORY_CARE_PROVIDER_SITE_OTHER): Payer: Medicare Other | Admitting: Psychology

## 2018-03-10 DIAGNOSIS — F332 Major depressive disorder, recurrent severe without psychotic features: Secondary | ICD-10-CM

## 2018-03-15 ENCOUNTER — Other Ambulatory Visit: Payer: Self-pay | Admitting: Internal Medicine

## 2018-03-24 ENCOUNTER — Ambulatory Visit (INDEPENDENT_AMBULATORY_CARE_PROVIDER_SITE_OTHER): Payer: Medicare Other | Admitting: Psychology

## 2018-03-24 DIAGNOSIS — F332 Major depressive disorder, recurrent severe without psychotic features: Secondary | ICD-10-CM | POA: Diagnosis not present

## 2018-03-24 IMAGING — CT CT ABD-PELV W/ CM
2 of 5 series · 14 of 46 positions shown, 16 images · IV contrast (APPLIED)
Comparison: April 05, 2007

CLINICAL DATA: Abdominal pain and emesis. Elevated liver enzymes
and lipase.

EXAM:
CT ABDOMEN AND PELVIS WITH CONTRAST
TECHNIQUE: Multidetector CT imaging of the abdomen and pelvis was performed
using the standard protocol following bolus administration of
intravenous contrast.
CONTRAST:  100 mL [id number] IOPAMIDOL ([id number]) INJECTION 61%

[Series 3: abd/ pelvis 5.0 i30f 2 · axial · 0.98mm/px · z∈[+766,+1216]mm · 11 of 102 slices shown, 13 images]
[im 6/102  soft-tissue]
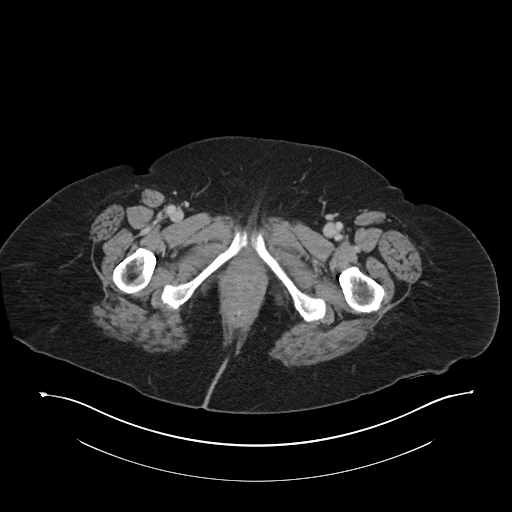
[im 6/102  bone]
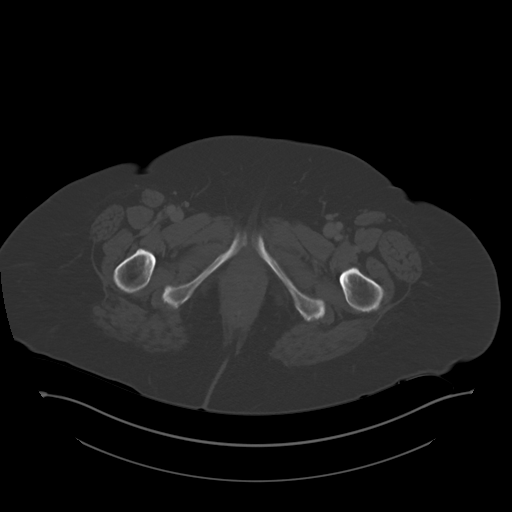
[im 16/102  soft-tissue]
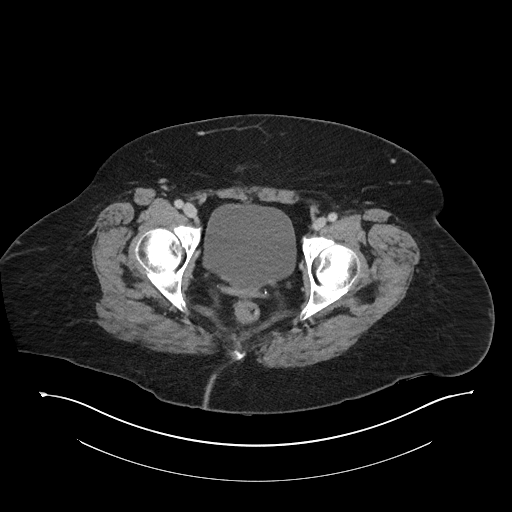
[im 27/102  soft-tissue]
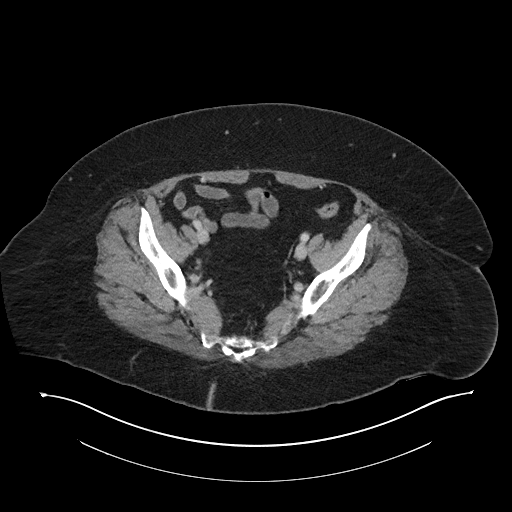
[im 32/102  soft-tissue]
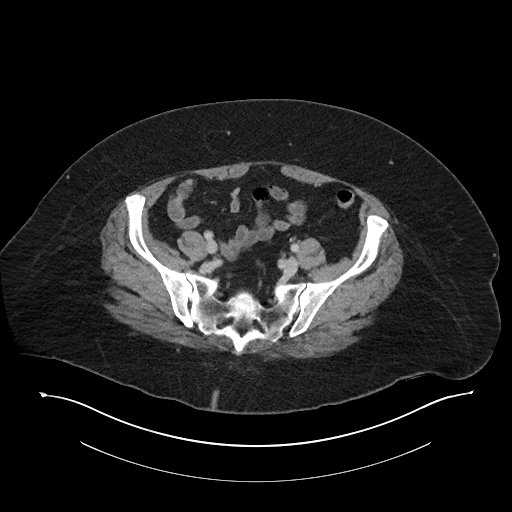
[im 43/102  soft-tissue]
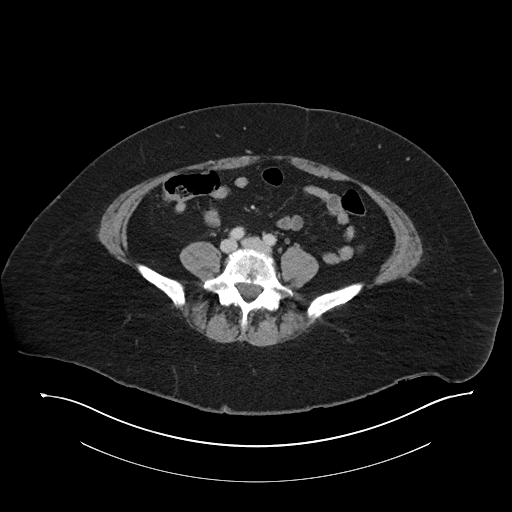
[im 54/102  soft-tissue]
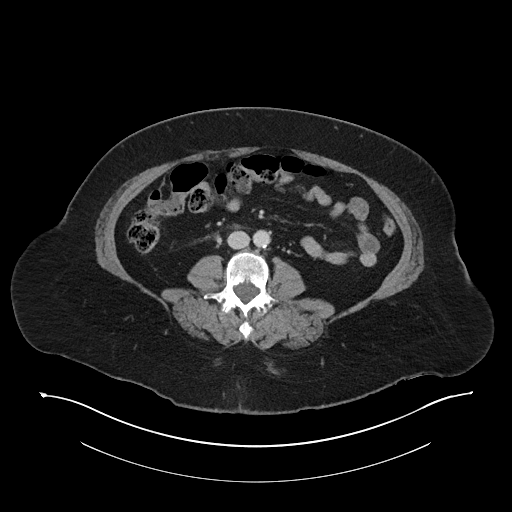
[im 59/102  soft-tissue]
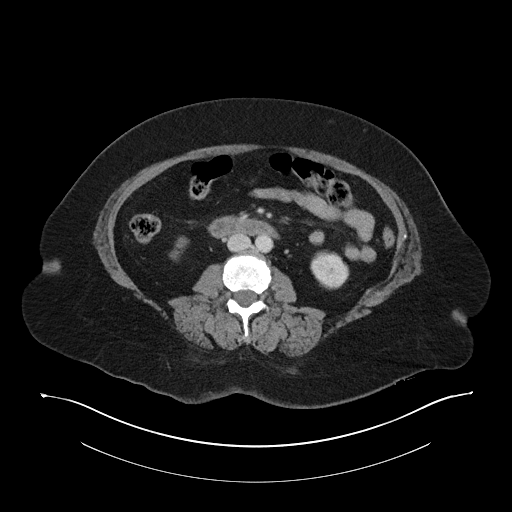
[im 70/102  soft-tissue]
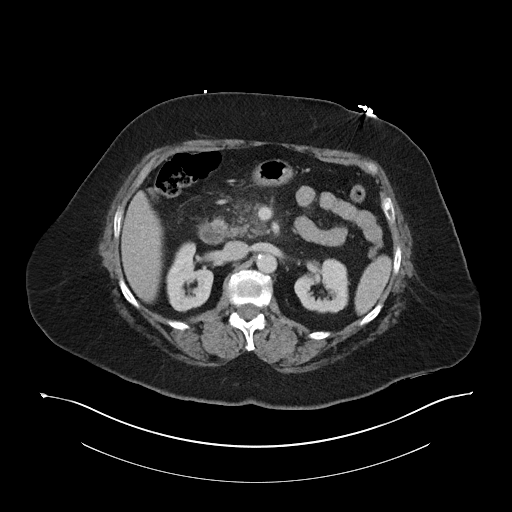
[im 75/102  soft-tissue]
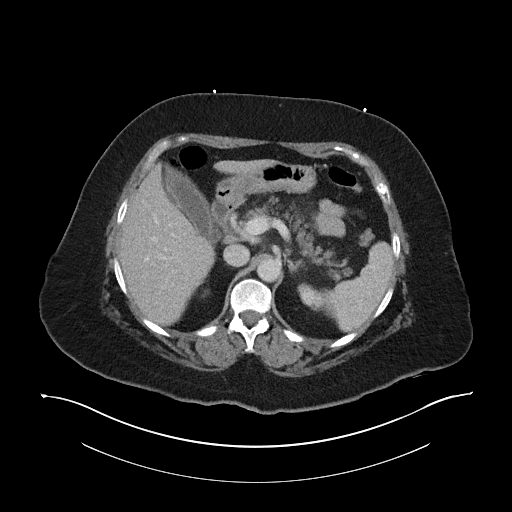
[im 75/102  bone]
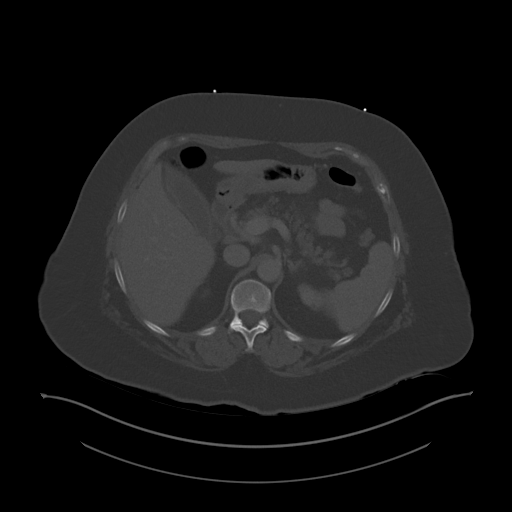
[im 86/102  soft-tissue]
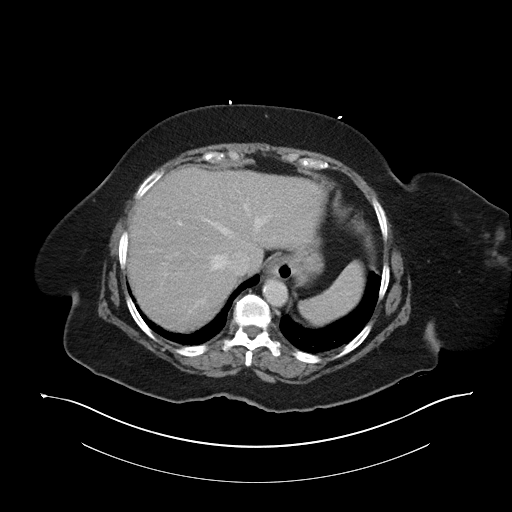
[im 96/102  soft-tissue]
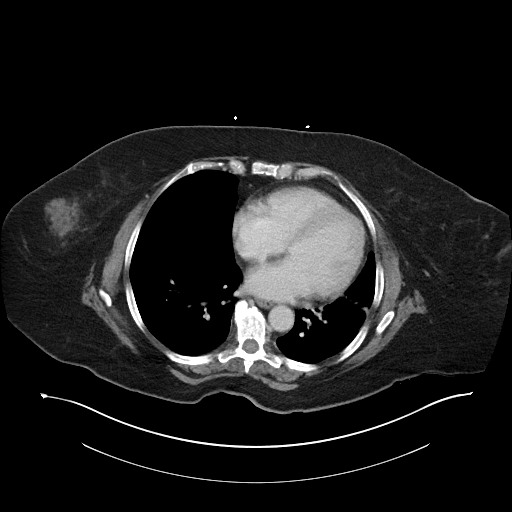

[Series 6: coronal soft tissue · coronal · 0.73mm/px · 3 of 96 slices shown]
[im 32/96  soft-tissue]
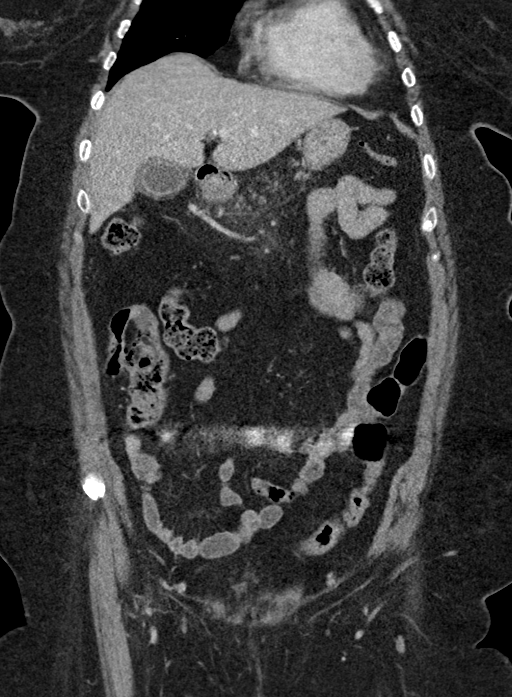
[im 43/96  soft-tissue]
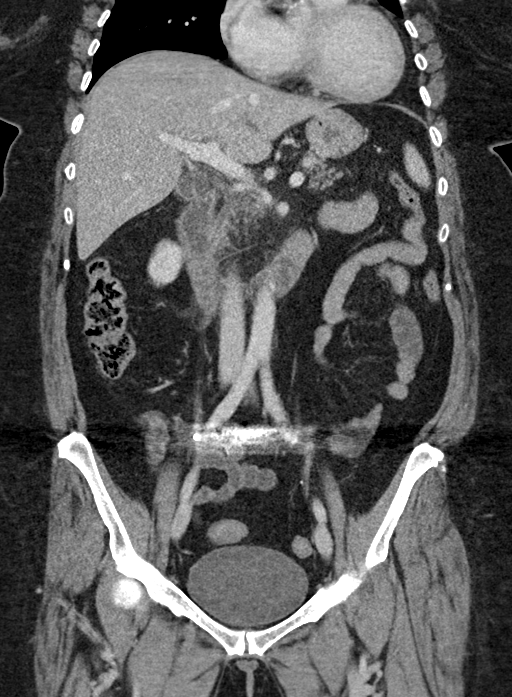
[im 53/96  soft-tissue]
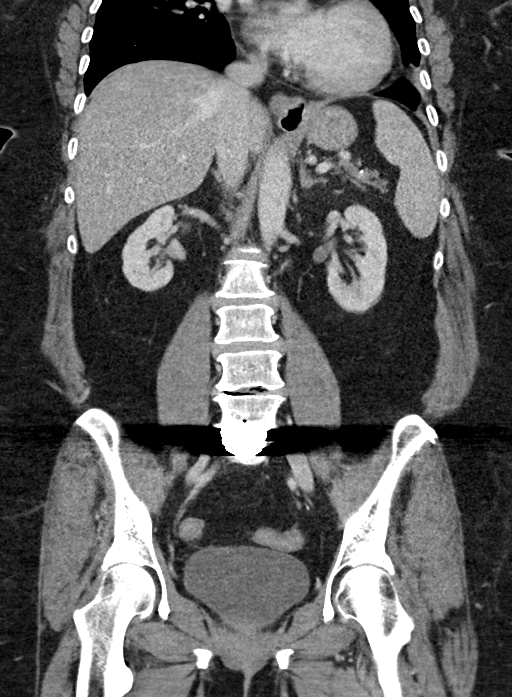

[14 of 46 positions shown; findings below may reference images not displayed]

FINDINGS: Lower chest: There is left base atelectasis. Lung bases otherwise
clear.

Hepatobiliary: There are no focal liver lesions appreciable. There
is evidence suggesting a degree of pericholecystic fluid. The
gallbladder wall does not appear appreciably thickened, however.
There is no biliary duct dilatation.

There is inflammatory change involving a portion of the posterior
head and uncinate process of the pancreas with mild soft tissue
stranding in this area and immediately adjacent to the pancreas.
Pancreas elsewhere appears normal. There is no well-defined fluid or
pseudocyst in the pancreas. There is no pancreatic mass or
pancreatic duct dilatation.

Spleen: No splenic lesions are evident.

Adrenals/Urinary Tract: Adrenals appear unremarkable bilaterally.
Kidneys bilaterally show no evident mass or hydronephrosis on either
side. There is no appreciable renal or ureteral calculus on either
side. Urinary bladder is midline with wall thickness within normal
limits.

Stomach/Bowel: There is localized wall thickness at the junction of
the second and third portions of duodenum, immediately adjacent to
the apparent pancreatic inflammation. No other bowel wall thickening
is noted. No bowel obstruction. No free air or portal venous air.

Vascular/Lymphatic: There are foci of atherosclerotic calcification
in the aorta. There is no abdominal aortic aneurysm. Major
mesenteric vessels appear patent. There is no appreciable adenopathy
in the abdomen or pelvis.

Reproductive: Uterus is absent.  No pelvic mass.

Other: Appendix is not appreciable. There is no periappendiceal
region inflammation. No abscess or ascites is apparent in the
abdomen or pelvis. There is a minimal ventral hernia containing only
fat.

Musculoskeletal: There is a disc spacer at L5-S1 causing
susceptibility artifact. There is degenerative change in the lumbar
spine. There are no blastic or lytic bone lesions. There are no
intramuscular or abdominal wall lesions.
IMPRESSION: 1. Suspect a degree of pericholecystic fluid. Gallbladder wall does
not appear appreciably thickened, however. Advise correlation with
gallbladder ultrasound to further assess.

2. Evidence of pancreatitis involving portions of the head and
uncinate process. There is localized inflammatory change with soft
tissue stranding in this portion of the pancreatitis and immediately
adjacent structures. Remainder the pancreas appears normal. No mass
or pseudocyst. No pancreatic duct dilatation. No fluid seen in the
peripancreatic area.

3. Wall thickening at the junction of the second and third portions
of the duodenum, likely duodenitis secondary to adjacent
pancreatitis. No other bowel wall thickening. No bowel obstruction.

4.  No abscess in the abdomen or pelvis.

5. No renal or ureteral calculus. No hydronephrosis on either side.

6.  Aortic atherosclerosis.

7.  Minimal ventral hernia containing only fat.

8.  Artifact from metallic disc spacer at L5-S1.

Aortic Atherosclerosis (R785W-GN8.8).

## 2018-04-07 ENCOUNTER — Ambulatory Visit (INDEPENDENT_AMBULATORY_CARE_PROVIDER_SITE_OTHER): Payer: Medicare Other | Admitting: Psychology

## 2018-04-07 DIAGNOSIS — F332 Major depressive disorder, recurrent severe without psychotic features: Secondary | ICD-10-CM | POA: Diagnosis not present

## 2018-04-09 ENCOUNTER — Other Ambulatory Visit: Payer: Self-pay | Admitting: Gastroenterology

## 2018-04-15 ENCOUNTER — Other Ambulatory Visit: Payer: Self-pay | Admitting: Internal Medicine

## 2018-04-21 ENCOUNTER — Ambulatory Visit (INDEPENDENT_AMBULATORY_CARE_PROVIDER_SITE_OTHER): Payer: Medicare Other | Admitting: Psychology

## 2018-04-21 ENCOUNTER — Other Ambulatory Visit: Payer: Self-pay

## 2018-04-21 DIAGNOSIS — F332 Major depressive disorder, recurrent severe without psychotic features: Secondary | ICD-10-CM

## 2018-05-05 ENCOUNTER — Ambulatory Visit (INDEPENDENT_AMBULATORY_CARE_PROVIDER_SITE_OTHER): Payer: Medicare Other | Admitting: Psychology

## 2018-05-05 DIAGNOSIS — F332 Major depressive disorder, recurrent severe without psychotic features: Secondary | ICD-10-CM | POA: Diagnosis not present

## 2018-05-12 ENCOUNTER — Other Ambulatory Visit: Payer: Self-pay | Admitting: Internal Medicine

## 2018-05-16 ENCOUNTER — Other Ambulatory Visit: Payer: Self-pay

## 2018-05-16 ENCOUNTER — Ambulatory Visit (INDEPENDENT_AMBULATORY_CARE_PROVIDER_SITE_OTHER): Payer: Medicare Other | Admitting: Internal Medicine

## 2018-05-16 ENCOUNTER — Encounter: Payer: Self-pay | Admitting: Internal Medicine

## 2018-05-16 DIAGNOSIS — M797 Fibromyalgia: Secondary | ICD-10-CM

## 2018-05-16 DIAGNOSIS — I428 Other cardiomyopathies: Secondary | ICD-10-CM

## 2018-05-16 DIAGNOSIS — K2270 Barrett's esophagus without dysplasia: Secondary | ICD-10-CM | POA: Diagnosis not present

## 2018-05-16 DIAGNOSIS — R42 Dizziness and giddiness: Secondary | ICD-10-CM

## 2018-05-16 NOTE — Progress Notes (Signed)
Subjective:    Patient ID: [name], female    DOB: 11/03/1945, 73 y.o.   MRN: [id number]  DOS:  05/16/2018 Type of visit - description:  Virtual Visit via Video Note  I connected with@ on 05/17/18 at  1:00 PM EDT by a video enabled telemedicine application and verified that I am speaking with the correct person using two identifiers.   THIS ENCOUNTER IS A VIRTUAL VISIT DUE TO COVID-19 - PATIENT WAS NOT SEEN IN THE OFFICE. PATIENT HAS CONSENTED TO VIRTUAL VISIT / TELEMEDICINE VISIT   Location of patient: home  Location of provider: office  I discussed the limitations of evaluation and management by telemedicine and the availability of in person appointments. The patient expressed understanding and agreed to proceed.  History of Present Illness: Since the last office visit, she had procedures and other visits, chart reviewed. Overall, her main concern continues to be pain.  Review of Systems  Denies fever chills Occasionally gets shortness of breath Very rarely gets mild chest discomfort, last less than 5 minutes, has not required nitroglycerin. Denies nausea or vomiting.  Had some diarrhea last week, that is improved. She is following all the CDC guidelines regards coronavirus.  Past Medical History:  Diagnosis Date  . Anemia   . Anxiety   . Barrett's esophagus   . Bilateral carpal tunnel syndrome    Dr. Eddie Dibbles, having injection therapy  . C. difficile diarrhea 08/01/2012   severe 2014  . Chronic cystitis    Dr. Matilde Sprang  . Chronic lower back pain   . Depression   . Dizziness   . DJD (degenerative joint disease)    bilateral hands; knees  . Family history of anesthesia complication    Mother had severe N/V  . GERD (gastroesophageal reflux disease)   . H/O cardiac catheterization    (-) cath 12-2002  , cath again 2011 (-)  . HTN (hypertension)   . Hyperlipidemia   . Insomnia   . Migraine   . Osteoporosis    pt unsure of this  . RLS (restless legs syndrome)    . Vitamin B 12 deficiency 04/09/2013    Past Surgical History:  Procedure Laterality Date  . Arm surgery Left    "don't remember what they did; arm wasn't broken"  . BILATERAL KNEE ARTHROSCOPY Bilateral   . CARDIAC CATHETERIZATION  01/22/10   clean cath  . CATARACT EXTRACTION W/ INTRAOCULAR LENS  IMPLANT, BILATERAL Bilateral   . CHOLECYSTECTOMY N/A 10/25/2016   Procedure: LAPAROSCOPIC CHOLECYSTECTOMY;  Surgeon: Georganna Skeans, MD;  Location: [facility];  Service: General;  Laterality: N/A;  . COLONOSCOPY W/ POLYPECTOMY    . FOOT SURGERY Bilateral    toenails removed; callus removed on right; hammertoes right"  . Knot     "removed from right neck; not a goiter"  . LUMBAR DISC SURGERY     L5 S1 anterior fusion  . OOPHORECTOMY    . RIGHT/LEFT HEART CATH AND CORONARY ANGIOGRAPHY N/A 05/06/2017   Procedure: RIGHT/LEFT HEART CATH AND CORONARY ANGIOGRAPHY;  Surgeon: Nelva Bush, MD;  Location: [facility];  Service: Cardiovascular;  Laterality: N/A;  . SHOULDER ARTHROSCOPY Right   . TOTAL KNEE ARTHROPLASTY  10/05/2011   Procedure: TOTAL KNEE ARTHROPLASTY;  Surgeon: Hessie Dibble, MD;  Location: [facility];  Service: Orthopedics;  Laterality: Right;  [name] VAGINAL HYSTERECTOMY     for endometriosis    Social History   Socioeconomic History  . Marital status: Divorced  Spouse name: Not on file  . Number of children: 2  . Years of education: 10  . Highest education level: High school graduate  Occupational History  . Occupation: Retired, post Ecologist: RETIRED  Social Needs  . Financial resource strain: Not on file  . Food insecurity:    Worry: Not on file    Inability: Not on file  . Transportation needs:    Medical: Not on file    Non-medical: Not on file  Tobacco Use  . Smoking status: Former Smoker    Packs/day: 0.50    Years: 10.00    Pack years: 5.00    Last attempt to quit: 02/09/1975    Years since quitting: 43.2  . Smokeless tobacco: Never Used  .  Tobacco comment: started at age 15.   quit in the 61s.  Substance and Sexual Activity  . Alcohol use: Yes    Alcohol/week: 0.0 standard drinks    Comment: 2 glasses of wine per year  . Drug use: No    Comment: CBD and hemp OIL   . Sexual activity: Not Currently    Partners: Male  Lifestyle  . Physical activity:    Days per week: Not on file    Minutes per session: Not on file  . Stress: Not on file  Relationships  . Social connections:    Talks on phone: Not on file    Gets together: Not on file    Attends religious service: Not on file    Active member of club or organization: Not on file    Attends meetings of clubs or organizations: Not on file    Relationship status: Not on file  . Intimate partner violence:    Fear of current or ex partner: Not on file    Emotionally abused: Not on file    Physically abused: Not on file    Forced sexual activity: Not on file  Other Topics Concern  . Not on file  Social History Narrative   Son lives with her.   Right-handed.   2 soft drinks per day.      Allergies as of 05/16/2018      Reactions   Dextromethorphan-guaifenesin Nausea And Vomiting   Morphine Nausea And Vomiting   Penicillins Rash   "> 30 years ago; best I can remember it was just a light rash on my arm"      Medication List       Accurate as of May 16, 2018  1:05 PM. Always use your most recent med list.        aspirin EC 81 MG tablet Take 81 mg by mouth daily.   cholecalciferol 1000 units tablet Commonly known as:  VITAMIN D Take 1,000 Units by mouth daily.   Cyanocobalamin 1000 MCG/ML Kit Inject 1,000 mcg as directed. Inject 1000 mcg into the skin once a month for vitamin b deficiency.   dicyclomine 10 MG capsule Commonly known as:  BENTYL Take 1 capsule (10 mg total) by mouth 3 (three) times daily before meals.   furosemide 20 MG tablet Commonly known as:  LASIX Take 1 tablet (20 mg total) by mouth as needed.   HYDROcodone-acetaminophen  10-325 MG tablet Commonly known as:  NORCO Take 1 tablet by mouth every 8 (eight) hours as needed.   Imodium A-D 2 MG tablet Generic drug:  loperamide Take as directed   meclizine 12.5 MG tablet Commonly known as:  ANTIVERT Take 1-2  tablets (12.5-25 mg total) by mouth 3 (three) times daily as needed for dizziness.   metoprolol succinate 50 MG 24 hr tablet Commonly known as:  TOPROL-XL Take 1 tablet (50 mg total) by mouth daily. Take with or immediately following a meal.   nitroGLYCERIN 0.4 MG SL tablet Commonly known as:  NITROSTAT Place 1 tablet (0.4 mg total) under the tongue every 5 (five) minutes as needed for chest pain.   omeprazole 40 MG capsule Commonly known as:  PRILOSEC TAKE 1 CAPSULE BY MOUTH TWICE A DAY   polyethylene glycol powder powder Commonly known as:  GLYCOLAX/MIRALAX Take 17 g by mouth daily.   rOPINIRole 1 MG tablet Commonly known as:  REQUIP Take 1 tablet (1 mg total) by mouth 2 (two) times daily.   sacubitril-valsartan 49-51 MG Commonly known as:  Entresto Take 1 tablet by mouth 2 (two) times daily.   SYSTANE BALANCE OP Apply 1 drop to eye as needed (DRY EYES).   traZODone 50 MG tablet Commonly known as:  DESYREL Take 1-2 tablets (50-100 mg total) by mouth at bedtime.           Objective:   Physical Exam There were no vitals taken for this visit. This is a video conference, she looked alert oriented x3 and in no distress.    Assessment     Assessment HTN Hyperlipidemia-  Pravachol, Crestor: Myalgias. Intolerant to Zetia (joint aches), see OV  06/2016  Depression, Anxiety (on clonazepam), insomnia (on trazodone):  depression x many  years, remotely on zoloft and other meds per psych, I rx lexapro 2015, then was rx effexor x a while Morbid obesity CV: CHF/ non ischemic cardiomyopathy dx 04-2017; cath normal coronaries, EF ~ 30% Carotid artery disease: 1 to 39% bilateral carotids 09-2016, start aspirin per neurology GI:  --GERD,  Barrett's esophagus, EGD 05-2015, next per GI --h/o persistent C. difficile diarrhea 2014 B12 deficiency Osteopenia: Dexa 2015 showed osteopenia, T score -1.9 (10/2016): Rx cs, vit D PULM: --NPSG 2008:  No osa, PLMS 4/hr with arousal and awakening --RLS - Requip  Migraines, f/u [facility] -- off topamax as off 06-2016 MSK: --Back pain, chronic: pain meds rx by ortho, Workers comp MD @ [employer] pain mngmt [place] --DJD,CTS B ([facility]) --Fibromyalgia.  See note from 02/14/2018 OAB, LUTS-- on myrbetriq Dr McDarmoth  Raynaud phenomena---- DX 07/2017  PLAN Video visit  HAs, dizziness, paresthesias: Since the last office visit, Entresto was decreased to half tablet twice a day and since then her dizziness is much improved. NCS was essentially negative MSK: Back pain, fibromyalgia, DJD: pain remains her main concern, on hydrocodone per the pain clinic, to have a local injection for low back pain. Fatigue: Was referred back to Dr. Halford Chessman, for a sleep study, that is pending, Dr Halford Chessman is a pulmonologist and currently we are in the midst of COVID-19 pandemia.  Will refer back to pulmonary in few months. CHF: Currently on aspirin, Lasix, Toprol, Entresto: Half tablet twice a day per patient.  She has very rarely chest pain, less than 5 minutes, has not reach for nitroglycerin, encouraged to do prn.  Has an appointment to see cardiology  next week. Under normal circumstances I would bring her to the office for blood work but at this time I will recommend to stay home. COVID-19: Encouraged to continue following all the guidelines regards prevention. GERD, Barrett's: Saw GI recently, next  EGD due  05/2018.   Others: Started vitamin D supplements  RTC 3  months, hopefully face-to-face.    I discussed the assessment and treatment plan with the patient. The patient was provided an opportunity to ask questions and all were answered. The patient agreed with the plan and demonstrated an understanding of the  instructions.   The patient was advised to call back or seek an in-person evaluation if the symptoms worsen or if the condition fails to improve as anticipated.

## 2018-05-17 NOTE — Assessment & Plan Note (Signed)
Video visit  HAs, dizziness, paresthesias: Since the last office visit, Entresto was decreased to half tablet twice a day and since then her dizziness is much improved. NCS was essentially negative MSK: Back pain, fibromyalgia, DJD: pain remains her main concern, on hydrocodone per the pain clinic, to have a local injection for low back pain. Fatigue: Was referred back to Dr. Halford Chessman, for a sleep study, that is pending, Dr Halford Chessman is a pulmonologist and currently we are in the midst of COVID-19 pandemia.  Will refer back to pulmonary in few months. CHF: Currently on aspirin, Lasix, Toprol, Entresto: Half tablet twice a day per patient.  She has very rarely chest pain, less than 5 minutes, has not reach for nitroglycerin, encouraged to do prn.  Has an appointment to see cardiology  next week. Under normal circumstances I would bring her to the office for blood work but at this time I will recommend to stay home. COVID-19: Encouraged to continue following all the guidelines regards prevention. GERD, Barrett's: Saw GI recently, next  EGD due  05/2018.   Others: Started vitamin D supplements  RTC 3 months, hopefully face-to-face.

## 2018-05-18 ENCOUNTER — Ambulatory Visit (INDEPENDENT_AMBULATORY_CARE_PROVIDER_SITE_OTHER): Payer: Medicare Other | Admitting: Psychology

## 2018-05-18 DIAGNOSIS — F332 Major depressive disorder, recurrent severe without psychotic features: Secondary | ICD-10-CM | POA: Diagnosis not present

## 2018-05-19 ENCOUNTER — Ambulatory Visit: Payer: Medicare Other | Admitting: Psychology

## 2018-05-23 ENCOUNTER — Telehealth (INDEPENDENT_AMBULATORY_CARE_PROVIDER_SITE_OTHER): Payer: Medicare Other | Admitting: Cardiology

## 2018-05-23 ENCOUNTER — Encounter: Payer: Self-pay | Admitting: Cardiology

## 2018-05-23 ENCOUNTER — Other Ambulatory Visit: Payer: Self-pay

## 2018-05-23 VITALS — Ht 66.0 in | Wt 242.0 lb

## 2018-05-23 DIAGNOSIS — I5042 Chronic combined systolic (congestive) and diastolic (congestive) heart failure: Secondary | ICD-10-CM | POA: Diagnosis not present

## 2018-05-23 DIAGNOSIS — I428 Other cardiomyopathies: Secondary | ICD-10-CM | POA: Diagnosis not present

## 2018-05-23 NOTE — Progress Notes (Signed)
Virtual Visit via Video Note   This visit type was conducted due to national recommendations for restrictions regarding the COVID-19 Pandemic (e.g. social distancing) in an effort to limit this patient's exposure and mitigate transmission in our community.  Due to her co-morbid illnesses, this patient is at least at moderate risk for complications without adequate follow up.  This format is felt to be most appropriate for this patient at this time.  All issues noted in this document were discussed and addressed.  A limited physical exam was performed with this format.  Please refer to the patient's chart for her consent to telehealth for [facility].   Evaluation Performed:  Follow-up visit  Date:  05/23/2018   ID:  [name], DOB 1945/11/15, MRN [id number]  Patient Location: Home  Provider Location: Home  PCP:  Colon Branch, MD  Cardiologist:  Candee Furbish, MD  Electrophysiologist:  None   Chief Complaint: Here for follow-up of chronic systolic heart failure  History of Present Illness:    [name] is a 72 y.o. female who presents via audio/video conferencing for a telehealth visit today.    She was last seen by Ermalinda Barrios in January 2020.  She has EF 30-45%, no CAD on catheterization 05/06/2017 with upper normal right and left-sided filling pressures.  She has had complaints in the past of dizziness and Lasix was stopped especially when Entresto was being titrated..  Previously, orthostatics were normal.  Heart rate had increased in the past.  Neurology has evaluated as well.  Dizziness improved after decreasing the dose of Entresto back to the moderate dose of 49/51 and her Lasix was encouraged to be PRN.  Physical therapy.  Nutrition, weight loss.  She is also been diagnosed with vertigo.  Just saw Dr. Larose Kells on 05/16/2018 virtual visit.  The patient does not have symptoms concerning for COVID-19 infection (fever, chills, cough, or new shortness of breath).    Past  Medical History:  Diagnosis Date  . Anemia   . Anxiety   . Barrett's esophagus   . Bilateral carpal tunnel syndrome    Dr. Eddie Dibbles, having injection therapy  . C. difficile diarrhea 08/01/2012   severe 2014  . Chronic cystitis    Dr. Matilde Sprang  . Chronic lower back pain   . Depression   . Dizziness   . DJD (degenerative joint disease)    bilateral hands; knees  . Family history of anesthesia complication    Mother had severe N/V  . GERD (gastroesophageal reflux disease)   . H/O cardiac catheterization    (-) cath 12-2002  , cath again 2011 (-)  . HTN (hypertension)   . Hyperlipidemia   . Insomnia   . Migraine   . Osteoporosis    pt unsure of this  . RLS (restless legs syndrome)   . Vitamin B 12 deficiency 04/09/2013   Past Surgical History:  Procedure Laterality Date  . Arm surgery Left    "don't remember what they did; arm wasn't broken"  . BILATERAL KNEE ARTHROSCOPY Bilateral   . CARDIAC CATHETERIZATION  01/22/10   clean cath  . CATARACT EXTRACTION W/ INTRAOCULAR LENS  IMPLANT, BILATERAL Bilateral   . CHOLECYSTECTOMY N/A 10/25/2016   Procedure: LAPAROSCOPIC CHOLECYSTECTOMY;  Surgeon: Georganna Skeans, MD;  Location: [place];  Service: General;  Laterality: N/A;  . COLONOSCOPY W/ POLYPECTOMY    . FOOT SURGERY Bilateral    toenails removed; callus removed on right; hammertoes right"  . Knot     "  removed from right neck; not a goiter"  . LUMBAR DISC SURGERY     L5 S1 anterior fusion  . OOPHORECTOMY    . RIGHT/LEFT HEART CATH AND CORONARY ANGIOGRAPHY N/A 05/06/2017   Procedure: RIGHT/LEFT HEART CATH AND CORONARY ANGIOGRAPHY;  Surgeon: Nelva Bush, MD;  Location: [facility];  Service: Cardiovascular;  Laterality: N/A;  . SHOULDER ARTHROSCOPY Right   . TOTAL KNEE ARTHROPLASTY  10/05/2011   Procedure: TOTAL KNEE ARTHROPLASTY;  Surgeon: Hessie Dibble, MD;  Location: [place];  Service: Orthopedics;  Laterality: Right;  [name] VAGINAL HYSTERECTOMY     for endometriosis      Current Meds  Medication Sig  . aspirin EC 81 MG tablet Take 81 mg by mouth daily.  . cholecalciferol (VITAMIN D) 1000 units tablet Take 1,000 Units by mouth daily.  [name] dicyclomine (BENTYL) 10 MG capsule Take 1 capsule (10 mg total) by mouth 3 (three) times daily before meals.  . furosemide (LASIX) 20 MG tablet Take 1 tablet (20 mg total) by mouth as needed.  [name] HYDROcodone-acetaminophen (NORCO) 10-325 MG tablet Take 1 tablet by mouth every 8 (eight) hours as needed.  . loperamide (IMODIUM A-D) 2 MG tablet Take as directed  . meclizine (ANTIVERT) 12.5 MG tablet Take 1-2 tablets (12.5-25 mg total) by mouth 3 (three) times daily as needed for dizziness.  . metoprolol succinate (TOPROL-XL) 50 MG 24 hr tablet Take 1 tablet (50 mg total) by mouth daily. Take with or immediately following a meal. (Patient taking differently: Take 50 mg by mouth daily. Patient reports she hasn't been taking this med for a while but she will start back on it.)  . nitroGLYCERIN (NITROSTAT) 0.4 MG SL tablet Place 1 tablet (0.4 mg total) under the tongue every 5 (five) minutes as needed for chest pain.  [name] omeprazole (PRILOSEC) 40 MG capsule TAKE 1 CAPSULE BY MOUTH TWICE A DAY  . polyethylene glycol powder (GLYCOLAX/MIRALAX) powder Take 17 g by mouth daily.  [name] Propylene Glycol (SYSTANE BALANCE OP) Apply 1 drop to eye as needed (DRY EYES).  [name] rOPINIRole (REQUIP) 1 MG tablet Take 1 tablet (1 mg total) by mouth 2 (two) times daily.  . sacubitril-valsartan (ENTRESTO) 49-51 MG Take 1 tablet by mouth 2 (two) times daily.  . traZODone (DESYREL) 50 MG tablet Take 1-2 tablets (50-100 mg total) by mouth at bedtime.     Allergies:   Dextromethorphan-guaifenesin; Morphine; and Penicillins   Social History   Tobacco Use  . Smoking status: Former Smoker    Packs/day: 0.50    Years: 10.00    Pack years: 5.00    Last attempt to quit: 02/09/1975    Years since quitting: 43.3  . Smokeless tobacco: Never Used  . Tobacco comment:  started at age 64.   quit in the 75s.  Substance Use Topics  . Alcohol use: Yes    Alcohol/week: 0.0 standard drinks    Comment: 2 glasses of wine per year  . Drug use: No    Comment: CBD and hemp OIL      Family Hx: The patient's family history includes Breast cancer in her sister; CAD in her father; Colon cancer in her maternal grandfather and maternal uncle; Congestive Heart Failure in her father; Dementia in her father; Diabetes in an other family member; Esophageal cancer in her brother; Hepatitis C in her brother; Lung cancer in her mother; Stroke in her father. There is no history of Rectal cancer or Stomach cancer.  ROS:  Please see the history of present illness.    Denies any fevers chills nausea vomiting syncope bleeding All other systems reviewed and are negative.   Prior CV studies:   The following studies were reviewed today:  Prior heart catheterization February 2019 with no CAD.  Reduced ejection fraction as below.  Labs/Other Tests and Data Reviewed:    EKG:  An ECG dated 05/05/2017 was personally reviewed today and demonstrated:  Sinus rhythm/sinus tachycardia rate 103 with right bundle branch block  Recent Labs: 09/06/2017: Hemoglobin 12.2; Platelets 219.0 12/16/2017: BUN 15; Creat 0.92; Potassium 4.2; Sodium 139; TSH 1.62   Recent Lipid Panel Lab Results  Component Value Date/Time   CHOL 148 10/30/2015 11:02 AM   TRIG 68.0 10/30/2015 11:02 AM   HDL 60.50 10/30/2015 11:02 AM   CHOLHDL 2 10/30/2015 11:02 AM   LDLCALC 74 10/30/2015 11:02 AM   LDLDIRECT 166.0 04/14/2010 03:39 PM    Wt Readings from Last 3 Encounters:  05/23/18 242 lb (109.8 kg)  02/21/18 244 lb (110.7 kg)  02/15/18 245 lb 12.8 oz (111.5 kg)     Objective:    Vital Signs:  Ht 5\' 6"  (1.676 m)   Wt 242 lb (109.8 kg)   BMI 39.06 kg/m    Well nourished, well developed female in no acute distress. She is comfortable, alert and oriented x3, no difficulty breathing.  Normal respiratory  effort.  ASSESSMENT & PLAN:    Chronic combined systolic and diastolic heart failure - On catheterization EF appeared to be in the 30 to 35% range but on echocardiogram appeared to be in the 40 to 45% range. -Entresto helping.  Decreased because of dizziness.  Lasix as needed.  Occasionally she will have swelling down to her ankles she states.  She will take her Lasix if needed at that situation.  No CAD on cath 04/2017.  Morbid obesity -Continue to encourage weight loss.  Previously she was interested in perhaps her insurance plan helping with weight loss program however this is not paid for according to prior note.  Her son has bags of frozen vegetables.  This would be great.  She sometimes has been eating more macaroni and cheese.  Discussed decreasing carbohydrates.  It is hard for her to exercise because she is worried about falling.  She uses her cane.  It is hard for her to walk her little dogs.  Essential hypertension -Continue to monitor.  Medications reviewed. Blood pressure cuff is currently broken at home.  She states that when she has enough money she will go ahead and order a new one.  Carotid artery disease -1 to 39% bilateral, mild in 2018 carotid Dopplers.  Continue with secondary prevention.  Aspirin was placed.  Barrett's esophagus/IBS/GERD -Per GI, dicyclomine  Depression/anxiety/fibromyalgia - Medications reviewed.  Exercise.   COVID-19 Education: The signs and symptoms of COVID-19 were discussed with the patient and how to seek care for testing (follow up with PCP or arrange E-visit).  The importance of social distancing was discussed today.  Time:   Today, I have spent 20 minutes with the patient with telehealth technology discussing the above problems.     Medication Adjustments/Labs and Tests Ordered: Current medicines are reviewed at length with the patient today.  Concerns regarding medicines are outlined above.   Tests Ordered: No orders of the defined  types were placed in this encounter.   Medication Changes: No orders of the defined types were placed in this encounter.   Disposition:  Follow  up in 6 month(s)  Signed, Candee Furbish, MD  05/23/2018 1:34 PM    [facility]

## 2018-05-23 NOTE — Patient Instructions (Signed)
Medication Instructions:  Please take Metoprolol daily.  Continue all other medications as listed.  If you need a refill on your cardiac medications before your next appointment, please call your pharmacy.   Follow-Up: At [facility], you and your health needs are our priority.  As part of our continuing mission to provide you with exceptional heart care, we have created designated Provider Care Teams.  These Care Teams include your primary Cardiologist (physician) and Advanced Practice Providers (APPs -  Physician Assistants and Nurse Practitioners) who all work together to provide you with the care you need, when you need it. Please follow up with Estella Husk, PA in 6 months.  Thank you for choosing [facility]!!

## 2018-05-25 ENCOUNTER — Ambulatory Visit: Payer: Self-pay | Admitting: Cardiology

## 2018-06-02 ENCOUNTER — Ambulatory Visit (INDEPENDENT_AMBULATORY_CARE_PROVIDER_SITE_OTHER): Payer: Medicare Other | Admitting: Psychology

## 2018-06-02 DIAGNOSIS — F332 Major depressive disorder, recurrent severe without psychotic features: Secondary | ICD-10-CM

## 2018-06-07 ENCOUNTER — Other Ambulatory Visit: Payer: Self-pay | Admitting: Cardiology

## 2018-06-16 ENCOUNTER — Encounter: Payer: Self-pay | Admitting: Gastroenterology

## 2018-06-16 ENCOUNTER — Ambulatory Visit (INDEPENDENT_AMBULATORY_CARE_PROVIDER_SITE_OTHER): Payer: Medicare Other | Admitting: Psychology

## 2018-06-16 DIAGNOSIS — F332 Major depressive disorder, recurrent severe without psychotic features: Secondary | ICD-10-CM

## 2018-06-30 ENCOUNTER — Ambulatory Visit (INDEPENDENT_AMBULATORY_CARE_PROVIDER_SITE_OTHER): Payer: Medicare Other | Admitting: Psychology

## 2018-06-30 DIAGNOSIS — F332 Major depressive disorder, recurrent severe without psychotic features: Secondary | ICD-10-CM | POA: Diagnosis not present

## 2018-07-20 ENCOUNTER — Other Ambulatory Visit: Payer: Self-pay

## 2018-07-20 ENCOUNTER — Ambulatory Visit: Payer: Medicare Other | Admitting: *Deleted

## 2018-07-20 VITALS — Ht 66.0 in | Wt 234.0 lb

## 2018-07-20 DIAGNOSIS — K2270 Barrett's esophagus without dysplasia: Secondary | ICD-10-CM

## 2018-07-20 NOTE — Progress Notes (Signed)
No egg or soy allergy known to patient  No issues with past sedation with any surgeries  or procedures, no intubation problems  No diet pills per patient No home 02 use per patient  No blood thinners per patient  Pt denies issues with constipation  No A fib or A flutter  EMMI video sent to pt's e mail   Pt verified name, DOB, address and insurance during PV today. Pt mailed instruction packet to included paper to complete and mail back to [facility] with addressed and stamped envelope, Emmi video, copy of consent form to read and not return, and instructions. PV completed over the phone. Pt encouraged to call with questions or issues   Pt is aware that care partner will wait in the car during parking lot; if they feel like they will be too hot to wait in the car; they may wait in the lobby.  We want them to wear a mask (we do not have any that we can provide them), practice social distancing, and we will check their temperatures when they get here.  I did remind patient that their care partner needs to stay in the parking lot the entire time. Pt will wear mask into building

## 2018-07-23 ENCOUNTER — Other Ambulatory Visit: Payer: Self-pay | Admitting: Physician Assistant

## 2018-07-25 ENCOUNTER — Other Ambulatory Visit: Payer: Self-pay

## 2018-07-25 DIAGNOSIS — K2270 Barrett's esophagus without dysplasia: Secondary | ICD-10-CM

## 2018-07-25 NOTE — Progress Notes (Signed)
-----   Message -----  From: Osvaldo Angst, CRNA  Sent: 07/24/2018  2:28 PM EDT  To: Ladene Artist, MD  Subject: Low EF pt                     Dr. Fuller Plan,   This pt is scheduled with you for a procedure on 6/23. Unfortunately her cardiac cath in 2019 revealed an EF of 30-35%. So her procedure needs to be scheduled in the hospital.   Thanks,   Osvaldo Angst   Patient has been contacted and rescheduled for 08/07/18 at [facility].  She verbalized understanding to go for Covid screen on 08/03/18 and quarantine at home as well as arrive on 08/07/18 9:30 and be NPO after midnight.

## 2018-07-27 ENCOUNTER — Other Ambulatory Visit: Payer: Self-pay | Admitting: Cardiology

## 2018-07-27 MED ORDER — ENTRESTO 49-51 MG PO TABS: 1.0000 | ORAL_TABLET | Freq: Two times a day (BID) | ORAL | 3 refills | Status: DC

## 2018-07-27 NOTE — Telephone Encounter (Signed)
Pt's medication was sent to pt's pharmacy as requested. Confirmation received.  °

## 2018-08-01 ENCOUNTER — Encounter: Payer: Medicare Other | Admitting: Gastroenterology

## 2018-08-03 ENCOUNTER — Other Ambulatory Visit (HOSPITAL_COMMUNITY)
Admission: RE | Admit: 2018-08-03 | Discharge: 2018-08-03 | Disposition: A | Discharge status: Home / Self Care | Payer: Medicare Other | Source: Ambulatory Visit | Attending: Gastroenterology | Admitting: Gastroenterology

## 2018-08-03 DIAGNOSIS — Z1159 Encounter for screening for other viral diseases: Secondary | ICD-10-CM | POA: Diagnosis not present

## 2018-08-03 LAB — SARS CORONAVIRUS 2 (TAT 6-24 HRS): SARS Coronavirus 2: NEGATIVE

## 2018-08-04 ENCOUNTER — Encounter (HOSPITAL_COMMUNITY): Payer: Self-pay

## 2018-08-04 NOTE — Progress Notes (Signed)
Unable to reach [name] via telephone left voicemail.

## 2018-08-07 ENCOUNTER — Encounter (HOSPITAL_COMMUNITY): Payer: Self-pay | Admitting: *Deleted

## 2018-08-07 ENCOUNTER — Encounter (HOSPITAL_COMMUNITY)
Admission: RE | Disposition: A | Discharge status: Home / Self Care | Payer: Self-pay | Source: Home / Self Care | Attending: Gastroenterology

## 2018-08-07 ENCOUNTER — Other Ambulatory Visit: Payer: Self-pay

## 2018-08-07 ENCOUNTER — Ambulatory Visit (HOSPITAL_COMMUNITY)
Admission: RE | Admit: 2018-08-07 | Discharge: 2018-08-07 | Disposition: A | Discharge status: Home / Self Care | Payer: Medicare Other | Attending: Gastroenterology | Admitting: Gastroenterology

## 2018-08-07 ENCOUNTER — Ambulatory Visit (HOSPITAL_COMMUNITY): Payer: Medicare Other | Admitting: Registered Nurse

## 2018-08-07 DIAGNOSIS — Z9049 Acquired absence of other specified parts of digestive tract: Secondary | ICD-10-CM | POA: Insufficient documentation

## 2018-08-07 DIAGNOSIS — Z87891 Personal history of nicotine dependence: Secondary | ICD-10-CM | POA: Diagnosis not present

## 2018-08-07 DIAGNOSIS — R51 Headache: Secondary | ICD-10-CM | POA: Insufficient documentation

## 2018-08-07 DIAGNOSIS — Z79899 Other long term (current) drug therapy: Secondary | ICD-10-CM | POA: Insufficient documentation

## 2018-08-07 DIAGNOSIS — I110 Hypertensive heart disease with heart failure: Secondary | ICD-10-CM | POA: Insufficient documentation

## 2018-08-07 DIAGNOSIS — F329 Major depressive disorder, single episode, unspecified: Secondary | ICD-10-CM | POA: Insufficient documentation

## 2018-08-07 DIAGNOSIS — G709 Myoneural disorder, unspecified: Secondary | ICD-10-CM | POA: Diagnosis not present

## 2018-08-07 DIAGNOSIS — M1990 Unspecified osteoarthritis, unspecified site: Secondary | ICD-10-CM | POA: Insufficient documentation

## 2018-08-07 DIAGNOSIS — I509 Heart failure, unspecified: Secondary | ICD-10-CM | POA: Diagnosis not present

## 2018-08-07 DIAGNOSIS — K317 Polyp of stomach and duodenum: Secondary | ICD-10-CM | POA: Diagnosis not present

## 2018-08-07 DIAGNOSIS — Z86010 Personal history of colonic polyps: Secondary | ICD-10-CM | POA: Diagnosis not present

## 2018-08-07 DIAGNOSIS — K589 Irritable bowel syndrome without diarrhea: Secondary | ICD-10-CM | POA: Diagnosis not present

## 2018-08-07 DIAGNOSIS — M797 Fibromyalgia: Secondary | ICD-10-CM | POA: Diagnosis not present

## 2018-08-07 DIAGNOSIS — Z6837 Body mass index (BMI) 37.0-37.9, adult: Secondary | ICD-10-CM | POA: Diagnosis not present

## 2018-08-07 DIAGNOSIS — K2270 Barrett's esophagus without dysplasia: Secondary | ICD-10-CM | POA: Diagnosis not present

## 2018-08-07 DIAGNOSIS — F419 Anxiety disorder, unspecified: Secondary | ICD-10-CM | POA: Insufficient documentation

## 2018-08-07 DIAGNOSIS — K449 Diaphragmatic hernia without obstruction or gangrene: Secondary | ICD-10-CM | POA: Diagnosis not present

## 2018-08-07 HISTORY — DX: Morbid (severe) obesity due to excess calories: E66.01

## 2018-08-07 HISTORY — DX: Irritable bowel syndrome, unspecified: K58.9

## 2018-08-07 HISTORY — DX: Other cardiomyopathies: I42.8

## 2018-08-07 HISTORY — PX: ESOPHAGOGASTRODUODENOSCOPY (EGD) WITH PROPOFOL: SHX5813

## 2018-08-07 HISTORY — DX: Chronic combined systolic (congestive) and diastolic (congestive) heart failure: I50.42

## 2018-08-07 HISTORY — DX: Chronic pain syndrome: G89.4

## 2018-08-07 HISTORY — DX: Personal history of colonic polyps: Z86.010

## 2018-08-07 HISTORY — PX: BIOPSY: SHX5522

## 2018-08-07 HISTORY — DX: Personal history of colon polyps, unspecified: Z86.0100

## 2018-08-07 HISTORY — DX: Disorder of arteries and arterioles, unspecified: I77.9

## 2018-08-07 SURGERY — ESOPHAGOGASTRODUODENOSCOPY (EGD) WITH PROPOFOL
Anesthesia: Monitor Anesthesia Care

## 2018-08-07 MED ORDER — PROPOFOL 10 MG/ML IV BOLUS
INTRAVENOUS | Status: AC
  Filled 2018-08-07: qty 60

## 2018-08-07 MED ORDER — SODIUM CHLORIDE 0.9 % IV SOLN: INTRAVENOUS | Status: DC

## 2018-08-07 MED ORDER — LACTATED RINGERS IV SOLN
INTRAVENOUS | Status: DC
  Administered 2018-08-07: 1000 mL via INTRAVENOUS

## 2018-08-07 MED ORDER — LIDOCAINE 2% (20 MG/ML) 5 ML SYRINGE
INTRAMUSCULAR | Status: DC | PRN
  Administered 2018-08-07: 40 mg via INTRAVENOUS

## 2018-08-07 MED ORDER — PROPOFOL 10 MG/ML IV BOLUS
INTRAVENOUS | Status: DC | PRN
  Administered 2018-08-07 (×5): 20 mg via INTRAVENOUS
  Administered 2018-08-07: 30 mg via INTRAVENOUS

## 2018-08-07 SURGICAL SUPPLY — 15 items

## 2018-08-07 NOTE — Anesthesia Procedure Notes (Signed)
Date/Time: 08/07/2018 10:31 AM Performed by: Talbot Grumbling, CRNA Oxygen Delivery Method: Nasal cannula

## 2018-08-07 NOTE — Op Note (Signed)
[facility] Patient Name: [name] Procedure Date: 08/07/2018 MRN: [id number] Attending MD: Ladene Artist , MD Date of Birth: 1945/07/13 CSN: [id number] Age: 73 Admit Type: Outpatient Procedure:                Upper GI endoscopy Indications:              Surveillance for malignancy due to personal history                            of Barrett's esophagus Providers:                Pricilla Riffle. Fuller Plan, MD, Cleda Daub, RN, Elspeth Cho Tech., Technician, Marla Roe, CRNA Referring MD:             Kathlene November, MD Medicines:                Monitored Anesthesia Care Complications:            No immediate complications. Estimated Blood Loss:     Estimated blood loss was minimal. Procedure:                Pre-Anesthesia Assessment:                           - Prior to the procedure, a History and Physical                            was performed, and patient medications and                            allergies were reviewed. The patient's tolerance of                            previous anesthesia was also reviewed. The risks                            and benefits of the procedure and the sedation                            options and risks were discussed with the patient.                            All questions were answered, and informed consent                            was obtained. Prior Anticoagulants: The patient has                            taken no previous anticoagulant or antiplatelet                            agents. ASA Grade Assessment: III - A patient with  severe systemic disease. After reviewing the risks                            and benefits, the patient was deemed in                            satisfactory condition to undergo the procedure.                           After obtaining informed consent, the endoscope was                            passed under direct vision. Throughout the                           procedure, the patient's blood pressure, pulse, and                            oxygen saturations were monitored continuously. The                            GIF-H190 ([id number]) Olympus gastroscope was                            introduced through the mouth, and advanced to the                            second part of duodenum. The upper GI endoscopy was                            accomplished without difficulty. The patient                            tolerated the procedure well. Scope In: Scope Out: Findings:      There were esophageal mucosal changes secondary to established       short-segment Barrett's disease present in the distal esophagus. The       maximum longitudinal extent of these mucosal changes was 1 cm in length.       Mucosa was biopsied with a cold forceps for histology in a targeted       manner at intervals of 0.5 cm in the lower third of the esophagus. One       specimen bottle was sent to pathology.      The exam of the esophagus was otherwise normal.      A few 3 to 4 mm sessile polyps with no bleeding and no stigmata of       recent bleeding were found in the gastric body. Sampling biopsies were       taken with a cold forceps for histology. Typical appearance of benign       gastric fundic polyps.      A small hiatal hernia was present.      The exam of the stomach was otherwise normal.      The duodenal bulb and second portion of the duodenum were normal. Impression:               -  Esophageal mucosal changes secondary to                            established short-segment Barrett's disease.                            Biopsied.                           - A few gastric polyps. Biopsied.                           - Small hiatal hernia.                           - Normal duodenal bulb and second portion of the                            duodenum. Moderate Sedation:      Not Applicable - Patient had care per Anesthesia. Recommendation:            - Patient has a contact number available for                            emergencies. The signs and symptoms of potential                            delayed complications were discussed with the                            patient. Return to normal activities tomorrow.                            Written discharge instructions were provided to the                            patient.                           - Resume previous diet.                           - Continue present medications.                           - Await pathology results.                           - Repeat upper endoscopy in 3 years for                            surveillance of Barrett's esophagus. Procedure Code(s):        --- Professional ---                           [phone], Esophagogastroduodenoscopy, flexible,  transoral; with biopsy, single or multiple Diagnosis Code(s):        --- Professional ---                           K22.70, Barrett's esophagus without dysplasia                           K31.7, Polyp of stomach and duodenum                           K44.9, Diaphragmatic hernia without obstruction or                            gangrene CPT copyright 2019 American Medical Association. All rights reserved. The codes documented in this report are preliminary and upon coder review may  be revised to meet current compliance requirements. Ladene Artist, MD 08/07/2018 10:51:08 AM This report has been signed electronically. Number of Addenda: 0

## 2018-08-07 NOTE — Transfer of Care (Signed)
Immediate Anesthesia Transfer of Care Note  Patient: Darylene Price Rowlands  Procedure(s) Performed: ESOPHAGOGASTRODUODENOSCOPY (EGD) WITH PROPOFOL (N/A ) BIOPSY  Patient Location: PACU  Anesthesia Type:MAC  Level of Consciousness: sedated  Airway & Oxygen Therapy: Patient Spontanous Breathing and Patient connected to nasal cannula oxygen  Post-op Assessment: Report given to RN and Post -op Vital signs reviewed and stable  Post vital signs: Reviewed and stable  Last Vitals:  Vitals Value Taken Time  BP    Temp    Pulse 75 08/07/18 1051  Resp 17 08/07/18 1051  SpO2 100 % 08/07/18 1051  Vitals shown include unvalidated device data.  Last Pain:  Vitals:   08/07/18 0949  TempSrc: Oral  PainSc: 0-No pain         Complications: No apparent anesthesia complications

## 2018-08-07 NOTE — Anesthesia Preprocedure Evaluation (Signed)
Anesthesia Evaluation  Patient identified by MRN, date of birth, ID band Patient awake    Reviewed: Allergy & Precautions, H&P , NPO status , Patient's Chart, lab work & pertinent test results  Airway Mallampati: II   Neck ROM: full    Dental   Pulmonary former smoker,    breath sounds clear to auscultation       Cardiovascular hypertension, +CHF   Rhythm:regular Rate:Normal     Neuro/Psych  Headaches, PSYCHIATRIC DISORDERS Anxiety Depression  Neuromuscular disease    GI/Hepatic GERD  ,  Endo/Other  Morbid obesity  Renal/GU      Musculoskeletal  (+) Arthritis , Fibromyalgia -  Abdominal   Peds  Hematology   Anesthesia Other Findings   Reproductive/Obstetrics                             Anesthesia Physical Anesthesia Plan  ASA: III  Anesthesia Plan: MAC   Post-op Pain Management:    Induction: Intravenous  PONV Risk Score and Plan: 2 and Propofol infusion and Treatment may vary due to age or medical condition  Airway Management Planned: Nasal Cannula  Additional Equipment:   Intra-op Plan:   Post-operative Plan:   Informed Consent: I have reviewed the patients History and Physical, chart, labs and discussed the procedure including the risks, benefits and alternatives for the proposed anesthesia with the patient or authorized representative who has indicated his/her understanding and acceptance.       Plan Discussed with: CRNA, Anesthesiologist and Surgeon  Anesthesia Plan Comments:         Anesthesia Quick Evaluation

## 2018-08-07 NOTE — H&P (Signed)
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  She notes frequent generalized abdominal pain and bloating.  She notes occasional incontinence.  She uses Imodium frequently to control diarrhea which often sends her into a constipation phase.  She has had similar symptoms for many years but they have worsened over the past few months.  She was previously prescribed dicyclomine but does not recall if it was effective.  She underwent laparoscopic cholecystectomy in September 2018.  She states her reflux symptoms have been under very good control since her cholecystectomy. Denies weight loss, change in stool caliber, melena, hematochezia, nausea, vomiting, dysphagia, reflux symptoms, chest pain.  Colonoscopy 12/2014: 1. Two sessile polyps in the sigmoid colon; polypectomies performed with a cold snare (hyperplastic) 2. 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 The examination was otherwise normal  Current Medications, Allergies, Past Medical History, Past Surgical History, Family History and Social History were reviewed in [employer] record.  Physical Exam: General: Well developed, well nourished, no acute distress Head: Normocephalic and atraumatic Eyes:  sclerae anicteric, EOMI Ears: Normal auditory acuity Mouth: No deformity or lesions Lungs: Clear throughout to auscultation Heart: Regular rate and rhythm; no murmurs, rubs or bruits Abdomen: Soft, mild diffuse tenderness and non distended. No masses, hepatosplenomegaly or hernias noted. Normal Bowel sounds Rectal: Not done Musculoskeletal: Symmetrical with no gross deformities  Pulses:  Normal pulses noted Extremities: No clubbing, cyanosis, edema or deformities noted Neurological: Alert oriented x 4, grossly nonfocal Psychological:  Alert and cooperative. Normal mood and affect   Assessment and  Recommendations:  1. 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 Continue omeprazole 40 mg po bid. Antireflux measures. Surveillance EGD due in 05/2018. The risks (including bleeding, perforation, infection, missed lesions, medication reactions and possible hospitalization or surgery if complications occur), benefits, and alternatives to endoscopy with possible biopsy and possible dilation were discussed with the patient and they consent to proceed.   2. IBS, constipation, diarrhea, bloating.  She is advised to reduce her use of Imodium.  MiraLAX once daily for constipation.  Begin dicyclomine 10 mg 3 times daily AC.  Advised her to call in 3 to 4 weeks if her symptoms are not under better control.  Avoid foods that trigger symptoms.  If not well controlled will obtain blood work and increase dicyclomine to 20 mg 4 times daily.  3. Depression, anxiety.   4. Fibromyalgia.   5. Status post cholecystectomy in 2018.

## 2018-08-07 NOTE — Anesthesia Postprocedure Evaluation (Signed)
Anesthesia Post Note  Patient: [name]  Procedure(s) Performed: ESOPHAGOGASTRODUODENOSCOPY (EGD) WITH PROPOFOL (N/A ) BIOPSY     Patient location during evaluation: Endoscopy Anesthesia Type: MAC Level of consciousness: awake and alert Pain management: pain level controlled Vital Signs Assessment: post-procedure vital signs reviewed and stable Respiratory status: spontaneous breathing, nonlabored ventilation, respiratory function stable and patient connected to nasal cannula oxygen Cardiovascular status: blood pressure returned to baseline and stable Postop Assessment: no apparent nausea or vomiting Anesthetic complications: no    Last Vitals:  Vitals:   08/07/18 1100 08/07/18 1110  BP: 99/72 (!) 174/80  Pulse: 63   Resp: 17 14  Temp:    SpO2: 95% 98%    Last Pain:  Vitals:   08/07/18 1110  TempSrc:   PainSc: 0-No pain                 Kenta Laster S

## 2018-08-07 NOTE — Discharge Instructions (Signed)
YOU HAD AN ENDOSCOPIC PROCEDURE TODAY: Refer to the procedure report and other information in the discharge instructions given to you for any specific questions about what was found during the examination. If this information does not answer your questions, please call [facility] office at [phone] to clarify.   YOU SHOULD EXPECT: Some feelings of bloating in the abdomen. Passage of more gas than usual. Walking can help get rid of the air that was put into your GI tract during the procedure and reduce the bloating. If you had a lower endoscopy (such as a colonoscopy or flexible sigmoidoscopy) you may notice spotting of blood in your stool or on the toilet paper. Some abdominal soreness may be present for a day or two, also.  DIET: Your first meal following the procedure should be a light meal and then it is ok to progress to your normal diet. A half-sandwich or bowl of soup is an example of a good first meal. Heavy or fried foods are harder to digest and may make you feel nauseous or bloated. Drink plenty of fluids but you should avoid alcoholic beverages for 24 hours. If you had a esophageal dilation, please see attached instructions for diet.    ACTIVITY: Your care partner should take you home directly after the procedure. You should plan to take it easy, moving slowly for the rest of the day. You can resume normal activity the day after the procedure however YOU SHOULD NOT DRIVE, use power tools, machinery or perform tasks that involve climbing or major physical exertion for 24 hours (because of the sedation medicines used during the test).   SYMPTOMS TO REPORT IMMEDIATELY: A gastroenterologist can be reached at any hour. Please call [phone]  for any of the following symptoms:   Following upper endoscopy (EGD, EUS, ERCP, esophageal dilation) Vomiting of blood or coffee ground material  New, significant abdominal pain  New, significant chest pain or pain under the shoulder blades  Painful or  persistently difficult swallowing  New shortness of breath  Black, tarry-looking or red, bloody stools  FOLLOW UP:  If any biopsies were taken you will be contacted by phone or by letter within the next 1-3 weeks. Call [phone]  if you have not heard about the biopsies in 3 weeks.  Please also call with any specific questions about appointments or follow up tests.  

## 2018-08-08 ENCOUNTER — Encounter (HOSPITAL_COMMUNITY): Payer: Self-pay | Admitting: Gastroenterology

## 2018-08-09 ENCOUNTER — Encounter: Payer: Self-pay | Admitting: Gastroenterology

## 2018-08-11 DIAGNOSIS — H40013 Open angle with borderline findings, low risk, bilateral: Secondary | ICD-10-CM | POA: Diagnosis not present

## 2018-08-11 DIAGNOSIS — Z961 Presence of intraocular lens: Secondary | ICD-10-CM | POA: Diagnosis not present

## 2018-08-11 DIAGNOSIS — H524 Presbyopia: Secondary | ICD-10-CM | POA: Diagnosis not present

## 2018-09-27 ENCOUNTER — Ambulatory Visit: Payer: Self-pay | Admitting: *Deleted

## 2018-09-27 NOTE — Progress Notes (Signed)
Subjective:   [name] is a 73 y.o. female who presents for Medicare Annual (Subsequent) preventive examination.  Pt enjoys playing games on her phone.  Review of Systems:  Cardiac Risk Factors include: advanced age (>74mn, >>37women);dyslipidemia;hypertension;obesity (BMI >30kg/m2);sedentary lifestyle Home Safety/Smoke Alarms: Feels safe in home. Smoke alarms in place.  Lives with adult son in a 1 story home. 2 dogs and 1 cat. Entry with ramp. Walk in shower with bench. Uses cane in house and walker if she goes out.   Female:        Mammo-  10/11/17  ordered   Dexa scan-   10/04/16    ordered [facility]- last 12/18/14     Objective:     Advanced Directives 09/28/2018 08/07/2018 09/23/2017 05/05/2017 10/25/2016 09/15/2016 05/30/2015  Does Patient Have a Medical Advance Directive? No No;Yes No Yes No No Yes  [place] of [place] Does patient want to make changes to medical advance directive? - - - No - Patient declined - - -  Copy of [place] Chart? - No - copy requested - No - copy requested - - -  Would patient like information on creating a medical advance directive? No - Patient declined - Yes (MAU/Ambulatory/Procedural Areas - Information given) - No - Patient declined No - Patient declined -  Pre-existing out of facility DNR order (yellow form or pink MOST form) - - - - - - -    Tobacco Social History   Tobacco Use  Smoking Status Former Smoker   Packs/day: 0.50   Years: 10.00   Pack years: 5.00   Quit date: 02/09/1975   Years since quitting: 43.6  Smokeless Tobacco Never Used  Tobacco Comment   started at age 73   quit in the 155s     Counseling given: Not Answered Comment: started at age 73   quit in the 1970s.   Clinical Intake:     Pain : 0-10 Pain Score: (pt states she hurts everywhere all the time.) Pain Type: Chronic  pain Pain Onset: More than a month ago Pain Frequency: Constant Pain Relieving Factors: medication (Hydrocodone)  Pain Relieving Factors: medication (Hydrocodone)              Past Medical History:  Diagnosis Date   Allergy    Anemia    Anxiety    Barrett's esophagus    Bilateral carpal tunnel syndrome    Dr. PEddie Dibbles having injection therapy   C. difficile diarrhea 08/01/2012   severe 2014   Carotid arterial disease ([place]    Cataract    removed both eyes    Chronic combined systolic and diastolic CHF (congestive heart failure) ([place]    Chronic cystitis    Dr. MMatilde Sprang  Chronic lower back pain    Chronic pain syndrome    Depression    Dizziness    DJD (degenerative joint disease)    bilateral hands; knees   Family history of anesthesia complication    Mother had severe N/V   Fibromyalgia    GERD (gastroesophageal reflux disease)    H/O cardiac catheterization    (-) cath 12-2002  , cath again 2011 (-)   History of colon polyps    HTN (hypertension)    Hyperlipidemia    past hx    IBS (irritable bowel syndrome)    Insomnia  Migraine    Morbid obesity ([place])    Non-ischemic cardiomyopathy ([place])    Osteoporosis    pt unsure of this   RLS (restless legs syndrome)    Vertigo    Vitamin B 12 deficiency 04/09/2013   Past Surgical History:  Procedure Laterality Date   Arm surgery Left    "don't remember what they did; arm wasn't broken"   BILATERAL KNEE ARTHROSCOPY Bilateral    BIOPSY  08/07/2018   Procedure: BIOPSY;  Surgeon: Ladene Artist, MD;  Location: [facility];  Service: Endoscopy;;   CARDIAC CATHETERIZATION  01/22/10   clean cath   CATARACT EXTRACTION W/ INTRAOCULAR LENS  IMPLANT, BILATERAL Bilateral    CHOLECYSTECTOMY N/A 10/25/2016   Procedure: LAPAROSCOPIC CHOLECYSTECTOMY;  Surgeon: Georganna Skeans, MD;  Location: [facility];  Service: General;  Laterality: N/A;   COLONOSCOPY     COLONOSCOPY W/ POLYPECTOMY      ESOPHAGOGASTRODUODENOSCOPY (EGD) WITH PROPOFOL N/A 08/07/2018   Procedure: ESOPHAGOGASTRODUODENOSCOPY (EGD) WITH PROPOFOL;  Surgeon: Ladene Artist, MD;  Location: [facility];  Service: Endoscopy;  Laterality: N/A;   FOOT SURGERY Bilateral    toenails removed; callus removed on right; hammertoes right"   Knot     "removed from right neck; not a goiter"   LUMBAR DISC SURGERY     L5 S1 anterior fusion   OOPHORECTOMY     RIGHT/LEFT HEART CATH AND CORONARY ANGIOGRAPHY N/A 05/06/2017   Procedure: RIGHT/LEFT HEART CATH AND CORONARY ANGIOGRAPHY;  Surgeon: Nelva Bush, MD;  Location: [facility];  Service: Cardiovascular;  Laterality: N/A;   SHOULDER ARTHROSCOPY Right    TOTAL KNEE ARTHROPLASTY  10/05/2011   Procedure: TOTAL KNEE ARTHROPLASTY;  Surgeon: Hessie Dibble, MD;  Location: [facility];  Service: Orthopedics;  Laterality: Right;   UPPER GASTROINTESTINAL ENDOSCOPY     VAGINAL HYSTERECTOMY     for endometriosis   Family History  Problem Relation Age of Onset   Lung cancer Mother    Dementia Father    CAD Father        dx in his 33s   Stroke Father    Congestive Heart Failure Father    Colon cancer Maternal Grandfather    Esophageal cancer Brother    Breast cancer Sister    Diabetes Other        Aunt   Colon cancer Maternal Uncle        2   Hepatitis C Brother    Rectal cancer Neg Hx    Stomach cancer Neg Hx    Colon polyps Neg Hx    Social History   Socioeconomic History   Marital status: Divorced    Spouse name: Not on file   Number of children: 2   Years of education: 12   Highest education level: High school graduate  Occupational History   Occupation: Retired, post Ecologist: RETIRED  Social Designer, fashion/clothing strain: Not on file   Food insecurity    Worry: Not on file    Inability: Not on file   Transportation needs    Medical: Not on file    Non-medical: Not on file  Tobacco Use   Smoking  status: Former Smoker    Packs/day: 0.50    Years: 10.00    Pack years: 5.00    Quit date: 02/09/1975    Years since quitting: 43.6   Smokeless tobacco: Never Used   Tobacco comment: started at age 75.  quit in the 1970s.  Substance and Sexual Activity   Alcohol use: Yes    Alcohol/week: 0.0 standard drinks    Comment: 2 glasses of wine per year   Drug use: No    Comment: CBD and hemp OIL    Sexual activity: Not Currently    Partners: Male  Lifestyle   Physical activity    Days per week: Not on file    Minutes per session: Not on file   Stress: Not on file  Relationships   Social connections    Talks on phone: Not on file    Gets together: Not on file    Attends religious service: Not on file    Active member of club or organization: Not on file    Attends meetings of clubs or organizations: Not on file    Relationship status: Not on file  Other Topics Concern   Not on file  Social History Narrative   Son lives with her.   Right-handed.   2 soft drinks per day.    Outpatient Encounter Medications as of 09/28/2018  Medication Sig   aspirin EC 81 MG tablet Take 81 mg by mouth daily.   cholecalciferol (VITAMIN D) 1000 units tablet Take 1,000 Units by mouth daily.   dicyclomine (BENTYL) 10 MG capsule Take 1 capsule (10 mg total) by mouth 3 (three) times daily before meals.   furosemide (LASIX) 20 MG tablet TAKE 1 TABLET BY MOUTH EVERY DAY (Patient taking differently: Take 20 mg by mouth daily as needed for fluid. )   HYDROcodone-acetaminophen (NORCO) 10-325 MG tablet Take 1 tablet by mouth every 6 (six) hours as needed (pain).    loratadine (CLARITIN) 10 MG tablet Take 10 mg by mouth daily as needed for allergies.   meclizine (ANTIVERT) 12.5 MG tablet Take 1-2 tablets (12.5-25 mg total) by mouth 3 (three) times daily as needed for dizziness.   metoprolol succinate (TOPROL-XL) 50 MG 24 hr tablet TAKE 1 TABLET (50 MG TOTAL) BY MOUTH DAILY. TAKE WITH OR  IMMEDIATELY FOLLOWING A MEAL.   omeprazole (PRILOSEC) 40 MG capsule TAKE 1 CAPSULE BY MOUTH TWICE A DAY   polyethylene glycol powder (GLYCOLAX/MIRALAX) powder Take 17 g by mouth daily. (Patient taking differently: Take 17 g by mouth daily as needed for moderate constipation. )   Propylene Glycol (SYSTANE BALANCE OP) Place 1 drop into both eyes 2 (two) times daily as needed (DRY EYES).    rOPINIRole (REQUIP) 1 MG tablet Take 1 tablet (1 mg total) by mouth 2 (two) times daily.   sacubitril-valsartan (ENTRESTO) 49-51 MG Take 1 tablet by mouth 2 (two) times daily.   traZODone (DESYREL) 50 MG tablet Take 1-2 tablets (50-100 mg total) by mouth at bedtime.   NARCAN 4 MG/0.1ML LIQD nasal spray kit Place 1 spray into the nose once as needed (opioid od).    nitroGLYCERIN (NITROSTAT) 0.4 MG SL tablet Place 1 tablet (0.4 mg total) under the tongue every 5 (five) minutes as needed for chest pain. (Patient not taking: Reported on 09/28/2018)   No facility-administered encounter medications on file as of 09/28/2018.     Activities of Daily Living In your present state of health, do you have any difficulty performing the following activities: 09/28/2018  Hearing? N  Vision? N  Difficulty concentrating or making decisions? N  Walking or climbing stairs? N  Dressing or bathing? N  Doing errands, shopping? N  Preparing Food and eating ? N  Using the Toilet? N  In the past six months, have you accidently leaked urine? N  Do you have problems with loss of bowel control? N  Managing your Medications? N  Managing your Finances? Y  Housekeeping or managing your Housekeeping? Y  Some recent data might be hidden    Patient Care Team: Colon Branch, MD as PCP - General Jerline Pain, MD as PCP - Cardiology (Cardiology) Bjorn Loser, MD as Consulting Physician (Urology) Maia Breslow, MD as Consulting Physician (Orthopedic Surgery) Alden Hipp, MD as Consulting Physician (Obstetrics and  Gynecology) Phylliss Bob, MD as Consulting Physician (Orthopedic Surgery) Red Christians, MD as Referring Physician (Pain Medicine)    Assessment:   This is a routine wellness examination for [facility]. Physical assessment deferred to PCP.  Exercise Activities and Dietary recommendations Current Exercise Habits: The patient does not participate in regular exercise at present, Exercise limited by: None identified Diet (meal preparation, eat out, water intake, caffeinated beverages, dairy products, fruits and vegetables): in general, a "healthy" diet  , well balanced     Goals     DIET - EAT MORE FRUITS AND VEGETABLES       Fall Risk Fall Risk  09/28/2018 09/23/2017 09/15/2016 09/15/2015 07/10/2015  Falls in the past year? 1 Yes Yes No Yes  Number falls in past yr: 1 2 or more 2 or more - 1  Injury with Fall? 1 No Yes - Yes  Comment - - - - Minor   Risk for fall due to : - Impaired balance/gait;Medication side effect;History of fall(s) - - -  Follow up - Education provided Education provided;Falls prevention discussed - Falls evaluation completed    Depression Screen PHQ 2/9 Scores 09/28/2018 02/14/2018 09/23/2017 09/15/2016  PHQ - 2 Score 1 3 0 0  PHQ- 9 Score - 11 - -  Exception Documentation - - - -     Cognitive Function Ad8 score reviewed for issues:  Issues making decisions:no  Less interest in hobbies / activities:no  Repeats questions, stories (family complaining):no  Trouble using ordinary gadgets (microwave, computer, phone):no  Forgets the month or year: no  Mismanaging finances: no  Remembering appts:no  Daily problems with thinking and/or memory:no Ad8 score is=0     MMSE - Mini Mental State Exam 09/23/2017 09/15/2016  Orientation to time 5 5  Orientation to Place 5 5  Registration 3 3  Attention/ Calculation 5 3  Recall 3 3  Language- name 2 objects 2 2  Language- repeat 1 1  Language- follow 3 step command 3 3  Language- read & follow direction 1 1  Write a  sentence 1 1  Copy design 1 1  Total score 30 28        Immunization History  Administered Date(s) Administered   Influenza Split 02/17/2011   Influenza Whole 01/14/2010   Influenza, High Dose Seasonal PF 01/24/2015, 12/16/2017   Influenza,inj,Quad PF,6+ Mos 10/22/2013   Influenza-Unspecified 03/13/2016, 10/20/2016   Pneumococcal Conjugate-13 09/10/2013   Pneumococcal Polysaccharide-23 04/14/2010   Td 02/08/1998, 04/14/2010   Zoster 12/06/2013    Screening Tests Health Maintenance  Topic Date Due   INFLUENZA VACCINE  09/09/2018   MAMMOGRAM  10/09/2018   TETANUS/TDAP  04/13/2020   COLONOSCOPY  12/17/2024   DEXA SCAN  Completed   Hepatitis C Screening  Completed   PNA vac Low Risk Adult  Completed       Plan:   See you next year!  Continue to eat heart healthy diet (full of fruits, vegetables,  whole grains, lean protein, water--limit salt, fat, and sugar intake) and increase physical activity as tolerated.  Continue doing brain stimulating activities (puzzles, reading, adult coloring books, staying active) to keep memory sharp.      I have personally reviewed and noted the following in the patients chart:    Medical and social history  Use of alcohol, tobacco or illicit drugs   Current medications and supplements  Functional ability and status  Nutritional status  Physical activity  Advanced directives  List of other physicians  Hospitalizations, surgeries, and ER visits in previous 12 months  Vitals  Screenings to include cognitive, depression, and falls  Referrals and appointments  In addition, I have reviewed and discussed with patient certain preventive protocols, quality metrics, and best practice recommendations. A written personalized care plan for preventive services as well as general preventive health recommendations were provided to patient.     Shela Nevin, [place]  09/28/2018

## 2018-09-28 ENCOUNTER — Ambulatory Visit (INDEPENDENT_AMBULATORY_CARE_PROVIDER_SITE_OTHER): Payer: Medicare Other | Admitting: *Deleted

## 2018-09-28 ENCOUNTER — Other Ambulatory Visit: Payer: Self-pay

## 2018-09-28 ENCOUNTER — Encounter: Payer: Self-pay | Admitting: *Deleted

## 2018-09-28 DIAGNOSIS — Z1231 Encounter for screening mammogram for malignant neoplasm of breast: Secondary | ICD-10-CM | POA: Diagnosis not present

## 2018-09-28 DIAGNOSIS — Z780 Asymptomatic menopausal state: Secondary | ICD-10-CM

## 2018-09-28 DIAGNOSIS — Z0000 Encounter for general adult medical examination without abnormal findings: Secondary | ICD-10-CM | POA: Diagnosis not present

## 2018-09-28 NOTE — Patient Instructions (Signed)
See you next year!  Continue to eat heart healthy diet (full of fruits, vegetables, whole grains, lean protein, water--limit salt, fat, and sugar intake) and increase physical activity as tolerated.  Continue doing brain stimulating activities (puzzles, reading, adult coloring books, staying active) to keep memory sharp.    [name] , Thank you for taking time to come for your Medicare Wellness Visit. I appreciate your ongoing commitment to your health goals. Please review the following plan we discussed and let me know if I can assist you in the future.   These are the goals we discussed: Goals    . DIET - EAT MORE FRUITS AND VEGETABLES       This is a list of the screening recommended for you and due dates:  Health Maintenance  Topic Date Due  . Flu Shot  09/09/2018  . Mammogram  10/09/2018  . Tetanus Vaccine  04/13/2020  . Colon Cancer Screening  12/17/2024  . DEXA scan (bone density measurement)  Completed  .  Hepatitis C: One time screening is recommended by Center for Disease Control  (CDC) for  adults born from [birth year] through 1966.   Completed  . Pneumonia vaccines  Completed    Health Maintenance After Age 21 After age 4, you are at a higher risk for certain long-term diseases and infections as well as injuries from falls. Falls are a major cause of broken bones and head injuries in people who are older than age 36. Getting regular preventive care can help to keep you healthy and well. Preventive care includes getting regular testing and making lifestyle changes as recommended by your health care provider. Talk with your health care provider about:  Which screenings and tests you should have. A screening is a test that checks for a disease when you have no symptoms.  A diet and exercise plan that is right for you. What should I know about screenings and tests to prevent falls? Screening and testing are the best ways to find a health problem early. Early diagnosis and  treatment give you the best chance of managing medical conditions that are common after age 64. Certain conditions and lifestyle choices may make you more likely to have a fall. Your health care provider may recommend:  Regular vision checks. Poor vision and conditions such as cataracts can make you more likely to have a fall. If you wear glasses, make sure to get your prescription updated if your vision changes.  Medicine review. Work with your health care provider to regularly review all of the medicines you are taking, including over-the-counter medicines. Ask your health care provider about any side effects that may make you more likely to have a fall. Tell your health care provider if any medicines that you take make you feel dizzy or sleepy.  Osteoporosis screening. Osteoporosis is a condition that causes the bones to get weaker. This can make the bones weak and cause them to break more easily.  Blood pressure screening. Blood pressure changes and medicines to control blood pressure can make you feel dizzy.  Strength and balance checks. Your health care provider may recommend certain tests to check your strength and balance while standing, walking, or changing positions.  Foot health exam. Foot pain and numbness, as well as not wearing proper footwear, can make you more likely to have a fall.  Depression screening. You may be more likely to have a fall if you have a fear of falling, feel emotionally low, or feel  unable to do activities that you used to do.  Alcohol use screening. Using too much alcohol can affect your balance and may make you more likely to have a fall. What actions can I take to lower my risk of falls? General instructions  Talk with your health care provider about your risks for falling. Tell your health care provider if: ? You fall. Be sure to tell your health care provider about all falls, even ones that seem minor. ? You feel dizzy, sleepy, or off-balance.  Take  over-the-counter and prescription medicines only as told by your health care provider. These include any supplements.  Eat a healthy diet and maintain a healthy weight. A healthy diet includes low-fat dairy products, low-fat (lean) meats, and fiber from whole grains, beans, and lots of fruits and vegetables. Home safety  Remove any tripping hazards, such as rugs, cords, and clutter.  Install safety equipment such as grab bars in bathrooms and safety rails on stairs.  Keep rooms and walkways well-lit. Activity   Follow a regular exercise program to stay fit. This will help you maintain your balance. Ask your health care provider what types of exercise are appropriate for you.  If you need a cane or walker, use it as recommended by your health care provider.  Wear supportive shoes that have nonskid soles. Lifestyle  Do not drink alcohol if your health care provider tells you not to drink.  If you drink alcohol, limit how much you have: ? 0-1 drink a day for women. ? 0-2 drinks a day for men.  Be aware of how much alcohol is in your drink. In the [place] one drink equals one typical bottle of beer (12 oz), one-half glass of wine (5 oz), or one shot of hard liquor (1 oz).  Do not use any products that contain nicotine or tobacco, such as cigarettes and e-cigarettes. If you need help quitting, ask your health care provider. Summary  Having a healthy lifestyle and getting preventive care can help to protect your health and wellness after age 10.  Screening and testing are the best way to find a health problem early and help you avoid having a fall. Early diagnosis and treatment give you the best chance for managing medical conditions that are more common for people who are older than age 98.  Falls are a major cause of broken bones and head injuries in people who are older than age 2. Take precautions to prevent a fall at home.  Work with your health care provider to learn what changes  you can make to improve your health and wellness and to prevent falls. This information is not intended to replace advice given to you by your health care provider. Make sure you discuss any questions you have with your health care provider. Document Released: 12/08/2016 Document Revised: 05/18/2018 Document Reviewed: 12/08/2016 Elsevier Patient Education  2020 [employer].

## 2018-09-29 ENCOUNTER — Other Ambulatory Visit (INDEPENDENT_AMBULATORY_CARE_PROVIDER_SITE_OTHER): Payer: Medicare Other

## 2018-09-29 ENCOUNTER — Other Ambulatory Visit: Payer: Self-pay

## 2018-09-29 ENCOUNTER — Encounter: Payer: Self-pay | Admitting: Internal Medicine

## 2018-09-29 ENCOUNTER — Ambulatory Visit (INDEPENDENT_AMBULATORY_CARE_PROVIDER_SITE_OTHER): Payer: Medicare Other | Admitting: Internal Medicine

## 2018-09-29 DIAGNOSIS — F329 Major depressive disorder, single episode, unspecified: Secondary | ICD-10-CM

## 2018-09-29 DIAGNOSIS — M797 Fibromyalgia: Secondary | ICD-10-CM | POA: Diagnosis not present

## 2018-09-29 DIAGNOSIS — M791 Myalgia, unspecified site: Secondary | ICD-10-CM | POA: Diagnosis not present

## 2018-09-29 DIAGNOSIS — F32A Depression, unspecified: Secondary | ICD-10-CM

## 2018-09-29 DIAGNOSIS — F419 Anxiety disorder, unspecified: Secondary | ICD-10-CM | POA: Diagnosis not present

## 2018-09-29 LAB — CBC WITH DIFFERENTIAL/PLATELET
Basophils Absolute: 0 10*3/uL (ref 0.0–0.1)
Basophils Relative: 0.5 % (ref 0.0–3.0)
Eosinophils Absolute: 0.1 10*3/uL (ref 0.0–0.7)
Eosinophils Relative: 1.5 % (ref 0.0–5.0)
HCT: 38.3 % (ref 36.0–46.0)
Hemoglobin: 12.7 g/dL (ref 12.0–15.0)
Lymphocytes Relative: 38.4 % (ref 12.0–46.0)
Lymphs Abs: 2.7 10*3/uL (ref 0.7–4.0)
MCHC: 33.1 g/dL (ref 30.0–36.0)
MCV: 89.7 fl (ref 78.0–100.0)
Monocytes Absolute: 0.4 10*3/uL (ref 0.1–1.0)
Monocytes Relative: 5.3 % (ref 3.0–12.0)
Neutro Abs: 3.8 10*3/uL (ref 1.4–7.7)
Neutrophils Relative %: 54.3 % (ref 43.0–77.0)
Platelets: 187 10*3/uL (ref 150.0–400.0)
RBC: 4.27 Mil/uL (ref 3.87–5.11)
RDW: 13.7 % (ref 11.5–15.5)
WBC: 7.1 10*3/uL (ref 4.0–10.5)

## 2018-09-29 LAB — COMPREHENSIVE METABOLIC PANEL
ALT: 8 U/L (ref 0–35)
AST: 11 U/L (ref 0–37)
Albumin: 3.9 g/dL (ref 3.5–5.2)
Alkaline Phosphatase: 66 U/L (ref 39–117)
BUN: 13 mg/dL (ref 6–23)
CO2: 27 mEq/L (ref 19–32)
Calcium: 8.6 mg/dL (ref 8.4–10.5)
Chloride: 107 mEq/L (ref 96–112)
Creatinine, Ser: 0.89 mg/dL (ref 0.40–1.20)
GFR: 62 mL/min (ref >60.00)
Glucose, Bld: 124 mg/dL — ABNORMAL HIGH (ref 70–99)
Potassium: 4.1 mEq/L (ref 3.5–5.1)
Sodium: 140 mEq/L (ref 135–145)
Total Bilirubin: 0.4 mg/dL (ref 0.2–1.2)
Total Protein: 6.6 g/dL (ref 6.0–8.3)

## 2018-09-29 LAB — SEDIMENTATION RATE: Sed Rate: 16 mm/hr (ref 0–30)

## 2018-09-29 LAB — CK: Total CK: 165 U/L (ref 7–177)

## 2018-09-29 MED ORDER — SERTRALINE HCL 50 MG PO TABS: ORAL_TABLET | ORAL | 1 refills | Status: DC

## 2018-09-29 NOTE — Progress Notes (Signed)
Subjective:    Patient ID: [name], female    DOB: [birth year], 73 y.o.   MRN: [id number]  DOS:  09/29/2018 Type of visit - description: Attempted  to make this a video visit, due to technical difficulties from the patient side it was not possible  thus we proceeded with a Virtual Visit via Telephone    I connected with@   by telephone and verified that I am speaking with the correct person using two identifiers.  THIS ENCOUNTER IS A VIRTUAL VISIT DUE TO COVID-19 - PATIENT WAS NOT SEEN IN THE OFFICE. PATIENT HAS CONSENTED TO VIRTUAL VISIT / TELEMEDICINE VISIT   Location of patient: home  Location of provider: office  I discussed the limitations, risks, security and privacy concerns of performing an evaluation and management service by telephone and the availability of in person appointments. I also discussed with the patient that there may be a patient responsible charge related to this service. The patient expressed understanding and agreed to proceed.   History of Present Illness: Acute visit The patient has fibromyalgia and chronic pain but she feels like her symptoms are much worse in the last 3 to 4 weeks: Everything hurts, legs, arms, neck, back pain.  She thinks this is probably related to anxiety and depression.  Reports that lately she has noted herself very "agitated", every minor problem upsets her, sometimes she cries for no reason. She is not sure what is triggering that. Her son Legrand Como actually moved  back to live with her 2 weeks ago and he is helping.  Review of Systems Denies fever chills. No weight loss She admits to some headaches, " sore to touch" when she palpates the top of the head and the nuchal area. No visual changes, no tick bite No rash No suicidal ideas  Past Medical History:  Diagnosis Date  . Allergy   . Anemia   . Anxiety   . Barrett's esophagus   . Bilateral carpal tunnel syndrome    Dr. Eddie Dibbles, having injection therapy  . C. difficile  diarrhea 08/01/2012   severe 2014  . Carotid arterial disease ([place])   . Cataract    removed both eyes   . Chronic combined systolic and diastolic CHF (congestive heart failure) ([place])   . Chronic cystitis    Dr. Matilde Sprang  . Chronic lower back pain   . Chronic pain syndrome   . Depression   . Dizziness   . DJD (degenerative joint disease)    bilateral hands; knees  . Family history of anesthesia complication    Mother had severe N/V  . Fibromyalgia   . GERD (gastroesophageal reflux disease)   . H/O cardiac catheterization    (-) cath 12-2002  , cath again 2011 (-)  . History of colon polyps   . HTN (hypertension)   . Hyperlipidemia    past hx   . IBS (irritable bowel syndrome)   . Insomnia   . Migraine   . Morbid obesity ([place])   . Non-ischemic cardiomyopathy ([place])   . Osteoporosis    pt unsure of this  . RLS (restless legs syndrome)   . Vertigo   . Vitamin B 12 deficiency 04/09/2013    Past Surgical History:  Procedure Laterality Date  . Arm surgery Left    "don't remember what they did; arm wasn't broken"  . BILATERAL KNEE ARTHROSCOPY Bilateral   . BIOPSY  08/07/2018   Procedure: BIOPSY;  Surgeon: Ladene Artist, MD;  [facility];  Service: Endoscopy;;  . CARDIAC CATHETERIZATION  01/22/10   clean cath  . CATARACT EXTRACTION W/ INTRAOCULAR LENS  IMPLANT, BILATERAL Bilateral   . CHOLECYSTECTOMY N/A 10/25/2016   Procedure: LAPAROSCOPIC CHOLECYSTECTOMY;  Surgeon: Georganna Skeans, MD;  Location: [facility];  Service: General;  Laterality: N/A;  . COLONOSCOPY    . COLONOSCOPY W/ POLYPECTOMY    . ESOPHAGOGASTRODUODENOSCOPY (EGD) WITH PROPOFOL N/A 08/07/2018   Procedure: ESOPHAGOGASTRODUODENOSCOPY (EGD) WITH PROPOFOL;  Surgeon: Ladene Artist, MD;  [facility];  Service: Endoscopy;  Laterality: N/A;  . FOOT SURGERY Bilateral    toenails removed; callus removed on right; hammertoes right"  . Knot     "removed from right neck; not a goiter"  . LUMBAR DISC  SURGERY     L5 S1 anterior fusion  . OOPHORECTOMY    . RIGHT/LEFT HEART CATH AND CORONARY ANGIOGRAPHY N/A 05/06/2017   Procedure: RIGHT/LEFT HEART CATH AND CORONARY ANGIOGRAPHY;  Surgeon: Nelva Bush, MD;  Location: [place] [facility];  Service: Cardiovascular;  Laterality: N/A;  . SHOULDER ARTHROSCOPY Right   . TOTAL KNEE ARTHROPLASTY  10/05/2011   Procedure: TOTAL KNEE ARTHROPLASTY;  Surgeon: Hessie Dibble, MD;  Location: [place] [facility];  Service: Orthopedics;  Laterality: Right;  . UPPER GASTROINTESTINAL ENDOSCOPY    . VAGINAL HYSTERECTOMY     for endometriosis    Social History   Socioeconomic History  . Marital status: Divorced    Spouse name: Not on file  . Number of children: 2  . Years of education: 79  . Highest education level: High school graduate  Occupational History  . Occupation: Retired, post Ecologist: RETIRED  Social Needs  . Financial resource strain: Not on file  . Food insecurity    Worry: Not on file    Inability: Not on file  . Transportation needs    Medical: Not on file    Non-medical: Not on file  Tobacco Use  . Smoking status: Former Smoker    Packs/day: 0.50    Years: 10.00    Pack years: 5.00    Quit date: 02/09/1975    Years since quitting: 43.6  . Smokeless tobacco: Never Used  . Tobacco comment: started at age 19.   quit in the 79s.  Substance and Sexual Activity  . Alcohol use: Yes    Alcohol/week: 0.0 standard drinks    Comment: 2 glasses of wine per year  . Drug use: No    Comment: CBD and hemp OIL   . Sexual activity: Not Currently    Partners: Male  Lifestyle  . Physical activity    Days per week: Not on file    Minutes per session: Not on file  . Stress: Not on file  Relationships  . Social Herbalist on phone: Not on file    Gets together: Not on file    Attends religious service: Not on file    Active member of club or organization: Not on file    Attends meetings of clubs or organizations: Not on  file    Relationship status: Not on file  . Intimate partner violence    Fear of current or ex partner: Not on file    Emotionally abused: Not on file    Physically abused: Not on file    Forced sexual activity: Not on file  Other Topics Concern  . Not on file  Social History Narrative   Son  lives with her.   Right-handed.   2 soft drinks per day.      Allergies as of 09/29/2018      Reactions   Dextromethorphan-guaifenesin Nausea And Vomiting   Morphine Nausea And Vomiting   Penicillins Rash   "> 30 years ago; best I can remember it was just a light rash on my arm" Did it involve swelling of the face/tongue/throat, SOB, or low BP? No Did it involve sudden or severe rash/hives, skin peeling, or any reaction on the inside of your mouth or nose? Unknown Did you need to seek medical attention at a hospital or doctor's office? No When did it last happen?more than 30 years  If all above answers are "NO", may proceed with cephalosporin use.      Medication List       Accurate as of September 29, 2018  1:30 PM. If you have any questions, ask your nurse or doctor.        aspirin EC 81 MG tablet Take 81 mg by mouth daily.   cholecalciferol 1000 units tablet Commonly known as: VITAMIN D Take 1,000 Units by mouth daily.   dicyclomine 10 MG capsule Commonly known as: BENTYL Take 1 capsule (10 mg total) by mouth 3 (three) times daily before meals.   Entresto 49-51 MG Generic drug: sacubitril-valsartan Take 1 tablet by mouth 2 (two) times daily.   furosemide 20 MG tablet Commonly known as: LASIX TAKE 1 TABLET BY MOUTH EVERY DAY What changed:   when to take this  reasons to take this   HYDROcodone-acetaminophen 10-325 MG tablet Commonly known as: NORCO Take 1 tablet by mouth every 6 (six) hours as needed (pain).   loratadine 10 MG tablet Commonly known as: CLARITIN Take 10 mg by mouth daily as needed for allergies.   meclizine 12.5 MG tablet Commonly known as:  ANTIVERT Take 1-2 tablets (12.5-25 mg total) by mouth 3 (three) times daily as needed for dizziness.   metoprolol succinate 50 MG 24 hr tablet Commonly known as: TOPROL-XL TAKE 1 TABLET (50 MG TOTAL) BY MOUTH DAILY. TAKE WITH OR IMMEDIATELY FOLLOWING A MEAL.   Narcan 4 MG/0.1ML Liqd nasal spray kit Generic drug: naloxone Place 1 spray into the nose once as needed (opioid od).   nitroGLYCERIN 0.4 MG SL tablet Commonly known as: NITROSTAT Place 1 tablet (0.4 mg total) under the tongue every 5 (five) minutes as needed for chest pain.   omeprazole 40 MG capsule Commonly known as: PRILOSEC TAKE 1 CAPSULE BY MOUTH TWICE A DAY   polyethylene glycol powder 17 GM/SCOOP powder Commonly known as: GLYCOLAX/MIRALAX Take 17 g by mouth daily. What changed:   when to take this  reasons to take this   rOPINIRole 1 MG tablet Commonly known as: REQUIP Take 1 tablet (1 mg total) by mouth 2 (two) times daily.   SYSTANE BALANCE OP Place 1 drop into both eyes 2 (two) times daily as needed (DRY EYES).   traZODone 50 MG tablet Commonly known as: DESYREL Take 1-2 tablets (50-100 mg total) by mouth at bedtime.           Objective:   Physical Exam There were no vitals taken for this visit. This is a virtual phone visit.  She is alert oriented x3, she seems to be slightly anxious but not depressed.  Speaking in complete sentences.    Assessment     Assessment HTN Hyperlipidemia-  Pravachol, Crestor: Myalgias. Intolerant to Zetia (joint aches), see OV  06/2016  Depression, Anxiety (on clonazepam), insomnia (on trazodone):  depression x many  years, remotely on zoloft and other meds per psych, I rx lexapro 2015, then was rx effexor x a while Morbid obesity CV: CHF/ non ischemic cardiomyopathy dx 04-2017; cath normal coronaries, EF ~ 30% Carotid artery disease: 1 to 39% bilateral carotids 09-2016, start aspirin per neurology GI:  --GERD, Barrett's esophagus, EGD 05-2015, next per GI --h/o  persistent C. difficile diarrhea 2014 B12 deficiency Osteopenia: Dexa 2015 showed osteopenia, T score -1.9 (10/2016): Rx cs, vit D PULM: --NPSG 2008:  No osa, PLMS 4/hr with arousal and awakening --RLS - Requip  Migraines, f/u [facility] -- off topamax as off 06-2016 MSK: --Back pain, chronic: pain meds rx by ortho, Workers comp MD @ [employer] pain mngmt [place] --DJD,CTS B ([facility]) --Fibromyalgia.  See note from 02/14/2018 OAB, LUTS-- on myrbetriq Dr McDarmoth  Raynaud phenomena---- DX 07/2017  PLAN Generalized myalgias: Although the patient has a number of MSK issues including fibromyalgia, the pain has increased in the last few weeks thus PMR must be ruled out given age.  We will get CMP, CBC, sed rate and total CK.  Further advised with results. Depression, anxiety, insomnia: Currently on clonazepam and trazodone.  Symptoms used to be controlled, evidently they are not now.  Unclear what is triggering this.  In the past, Zoloft was the medication to help the most.  Plan: Start Zoloft 50 mg daily then 75 mg daily. Interaction between sertraline and trazodone noted, patient will watch for unusual symptoms like hyperthermia, muscle rigidity. RTC 3 weeks, sooner if severe symptoms, fever, chills, rash or major headache.  Patient verbalized understanding.    I discussed the assessment and treatment plan with the patient. The patient was provided an opportunity to ask questions and all were answered. The patient agreed with the plan and demonstrated an understanding of the instructions.   The patient was advised to call back or seek an in-person evaluation if the symptoms worsen or if the condition fails to improve as anticipated.  I provided 25 minutes of non-face-to-face time during this encounter.  Kathlene November, MD

## 2018-10-01 NOTE — Assessment & Plan Note (Signed)
Generalized myalgias: Although the patient has a number of MSK issues including fibromyalgia, the pain has increased in the last few weeks thus PMR must be ruled out given age.  We will get CMP, CBC, sed rate and total CK.  Further advised with results. Depression, anxiety, insomnia: Currently on clonazepam and trazodone.  Symptoms used to be controlled, evidently they are not now.  Unclear what is triggering this.  In the past, Zoloft was the medication to help the most.  Plan: Start Zoloft 50 mg daily then 75 mg daily. Interaction between sertraline and trazodone noted, patient will watch for unusual symptoms like hyperthermia, muscle rigidity. RTC 3 weeks, sooner if severe symptoms, fever, chills, rash or major headache.  Patient verbalized understanding.

## 2018-10-31 ENCOUNTER — Ambulatory Visit (INDEPENDENT_AMBULATORY_CARE_PROVIDER_SITE_OTHER): Payer: Medicare Other | Admitting: Internal Medicine

## 2018-10-31 ENCOUNTER — Other Ambulatory Visit: Payer: Self-pay

## 2018-10-31 ENCOUNTER — Encounter: Payer: Self-pay | Admitting: Internal Medicine

## 2018-10-31 VITALS — BP 147/70 | HR 73 | Temp 97.5°F | Ht 66.0 in | Wt 232.0 lb

## 2018-10-31 DIAGNOSIS — M791 Myalgia, unspecified site: Secondary | ICD-10-CM

## 2018-10-31 DIAGNOSIS — I10 Essential (primary) hypertension: Secondary | ICD-10-CM

## 2018-10-31 DIAGNOSIS — Z23 Encounter for immunization: Secondary | ICD-10-CM | POA: Diagnosis not present

## 2018-10-31 DIAGNOSIS — R5383 Other fatigue: Secondary | ICD-10-CM | POA: Diagnosis not present

## 2018-10-31 DIAGNOSIS — G4733 Obstructive sleep apnea (adult) (pediatric): Secondary | ICD-10-CM

## 2018-10-31 DIAGNOSIS — F329 Major depressive disorder, single episode, unspecified: Secondary | ICD-10-CM

## 2018-10-31 DIAGNOSIS — F419 Anxiety disorder, unspecified: Secondary | ICD-10-CM | POA: Diagnosis not present

## 2018-10-31 DIAGNOSIS — F32A Depression, unspecified: Secondary | ICD-10-CM

## 2018-10-31 MED ORDER — METOPROLOL SUCCINATE ER 50 MG PO TB24: ORAL_TABLET | ORAL | 1 refills | Status: DC

## 2018-10-31 NOTE — Progress Notes (Signed)
Subjective:    Patient ID: [name], female    DOB: 1945-09-22, 73 y.o.   MRN: [id number]  DOS:  10/31/2018 Type of visit - description: Routine office visit Anxiety depression: On Zoloft, definitely improved. Insomnia: Still has issues on and off.  Good compliance with medication. Fatigue: Unchanged HTN: Ambulatory BPs 140/90 most of the time.   Wt Readings from Last 3 Encounters:  10/31/18 232 lb (105.2 kg)  08/07/18 232 lb (105.2 kg)  07/20/18 234 lb (106.1 kg)    BP Readings from Last 3 Encounters:  10/31/18 (!) 147/70  08/07/18 (!) 174/80  02/21/18 130/88    Review of Systems Dizziness on and off, recurrent issue.  No different from previous episodes. No associated diplopia, slurred speech or motor deficits.   Past Medical History:  Diagnosis Date  . Allergy   . Anemia   . Anxiety   . Barrett's esophagus   . Bilateral carpal tunnel syndrome    Dr. Eddie Dibbles, having injection therapy  . C. difficile diarrhea 08/01/2012   severe 2014  . Carotid arterial disease ([place])   . Cataract    removed both eyes   . Chronic combined systolic and diastolic CHF (congestive heart failure) ([place])   . Chronic cystitis    Dr. Matilde Sprang  . Chronic lower back pain   . Chronic pain syndrome   . Depression   . Dizziness   . DJD (degenerative joint disease)    bilateral hands; knees  . Family history of anesthesia complication    Mother had severe N/V  . Fibromyalgia   . GERD (gastroesophageal reflux disease)   . H/O cardiac catheterization    (-) cath 12-2002  , cath again 2011 (-)  . History of colon polyps   . HTN (hypertension)   . Hyperlipidemia    past hx   . IBS (irritable bowel syndrome)   . Insomnia   . Migraine   . Morbid obesity ([place])   . Non-ischemic cardiomyopathy ([place])   . Osteoporosis    pt unsure of this  . RLS (restless legs syndrome)   . Vertigo   . Vitamin B 12 deficiency 04/09/2013    Past Surgical History:  Procedure Laterality Date  . Arm  surgery Left    "don't remember what they did; arm wasn't broken"  . BILATERAL KNEE ARTHROSCOPY Bilateral   . BIOPSY  08/07/2018   Procedure: BIOPSY;  Surgeon: Ladene Artist, MD;  [facility];  Service: Endoscopy;;  . CARDIAC CATHETERIZATION  01/22/10   clean cath  . CATARACT EXTRACTION W/ INTRAOCULAR LENS  IMPLANT, BILATERAL Bilateral   . CHOLECYSTECTOMY N/A 10/25/2016   Procedure: LAPAROSCOPIC CHOLECYSTECTOMY;  Surgeon: Georganna Skeans, MD;  Location: [place];  Service: General;  Laterality: N/A;  . COLONOSCOPY    . COLONOSCOPY W/ POLYPECTOMY    . ESOPHAGOGASTRODUODENOSCOPY (EGD) WITH PROPOFOL N/A 08/07/2018   Procedure: ESOPHAGOGASTRODUODENOSCOPY (EGD) WITH PROPOFOL;  Surgeon: Ladene Artist, MD;  [facility];  Service: Endoscopy;  Laterality: N/A;  . FOOT SURGERY Bilateral    toenails removed; callus removed on right; hammertoes right"  . Knot     "removed from right neck; not a goiter"  . LUMBAR DISC SURGERY     L5 S1 anterior fusion  . OOPHORECTOMY    . RIGHT/LEFT HEART CATH AND CORONARY ANGIOGRAPHY N/A 05/06/2017   Procedure: RIGHT/LEFT HEART CATH AND CORONARY ANGIOGRAPHY;  Surgeon: Nelva Bush, MD;  Location: [place] [facility];  Service: Cardiovascular;  Laterality: N/A;  . SHOULDER ARTHROSCOPY Right   . TOTAL KNEE ARTHROPLASTY  10/05/2011   Procedure: TOTAL KNEE ARTHROPLASTY;  Surgeon: Hessie Dibble, MD;  Location: [place];  Service: Orthopedics;  Laterality: Right;  . UPPER GASTROINTESTINAL ENDOSCOPY    . VAGINAL HYSTERECTOMY     for endometriosis    Social History   Socioeconomic History  . Marital status: Divorced    Spouse name: Not on file  . Number of children: 2  . Years of education: 22  . Highest education level: High school graduate  Occupational History  . Occupation: Retired, post Ecologist: RETIRED  Social Needs  . Financial resource strain: Not on file  . Food insecurity    Worry: Not on file    Inability: Not  on file  . Transportation needs    Medical: Not on file    Non-medical: Not on file  Tobacco Use  . Smoking status: Former Smoker    Packs/day: 0.50    Years: 10.00    Pack years: 5.00    Quit date: 02/09/1975    Years since quitting: 43.7  . Smokeless tobacco: Never Used  . Tobacco comment: started at age 61.   quit in the 62s.  Substance and Sexual Activity  . Alcohol use: Yes    Alcohol/week: 0.0 standard drinks    Comment: 2 glasses of wine per year  . Drug use: No    Comment: CBD and hemp OIL   . Sexual activity: Not Currently    Partners: Male  Lifestyle  . Physical activity    Days per week: Not on file    Minutes per session: Not on file  . Stress: Not on file  Relationships  . Social Herbalist on phone: Not on file    Gets together: Not on file    Attends religious service: Not on file    Active member of club or organization: Not on file    Attends meetings of clubs or organizations: Not on file    Relationship status: Not on file  . Intimate partner violence    Fear of current or ex partner: Not on file    Emotionally abused: Not on file    Physically abused: Not on file    Forced sexual activity: Not on file  Other Topics Concern  . Not on file  Social History Narrative   Son lives with her.   Right-handed.   2 soft drinks per day.      Allergies as of 10/31/2018      Reactions   Dextromethorphan-guaifenesin Nausea And Vomiting   Morphine Nausea And Vomiting   Penicillins Rash   "> 30 years ago; best I can remember it was just a light rash on my arm" Did it involve swelling of the face/tongue/throat, SOB, or low BP? No Did it involve sudden or severe rash/hives, skin peeling, or any reaction on the inside of your mouth or nose? Unknown Did you need to seek medical attention at a hospital or doctor's office? No When did it last happen?more than 30 years  If all above answers are "NO", may proceed with cephalosporin use.       Medication List       Accurate as of October 31, 2018 11:59 PM. If you have any questions, ask your nurse or doctor.        aspirin EC 81 MG tablet Take 81 mg by mouth  daily.   cholecalciferol 1000 units tablet Commonly known as: VITAMIN D Take 1,000 Units by mouth daily.   dicyclomine 10 MG capsule Commonly known as: BENTYL Take 1 capsule (10 mg total) by mouth 3 (three) times daily before meals.   Entresto 49-51 MG Generic drug: sacubitril-valsartan Take 1 tablet by mouth 2 (two) times daily.   furosemide 20 MG tablet Commonly known as: LASIX TAKE 1 TABLET BY MOUTH EVERY DAY What changed:   when to take this  reasons to take this   HYDROcodone-acetaminophen 10-325 MG tablet Commonly known as: NORCO Take 1 tablet by mouth every 6 (six) hours as needed (pain).   loratadine 10 MG tablet Commonly known as: CLARITIN Take 10 mg by mouth daily as needed for allergies.   meclizine 12.5 MG tablet Commonly known as: ANTIVERT Take 1-2 tablets (12.5-25 mg total) by mouth 3 (three) times daily as needed for dizziness.   metoprolol succinate 50 MG 24 hr tablet Commonly known as: TOPROL-XL 1.5 tablets a day What changed:   how much to take  how to take this  when to take this  additional instructions Changed by: Kathlene November, MD   Narcan 4 MG/0.1ML Liqd nasal spray kit Generic drug: naloxone Place 1 spray into the nose once as needed (opioid od).   nitroGLYCERIN 0.4 MG SL tablet Commonly known as: NITROSTAT Place 1 tablet (0.4 mg total) under the tongue every 5 (five) minutes as needed for chest pain.   omeprazole 40 MG capsule Commonly known as: PRILOSEC TAKE 1 CAPSULE BY MOUTH TWICE A DAY   polyethylene glycol powder 17 GM/SCOOP powder Commonly known as: GLYCOLAX/MIRALAX Take 17 g by mouth daily. What changed:   when to take this  reasons to take this   rOPINIRole 1 MG tablet Commonly known as: REQUIP Take 1 tablet (1 mg total) by mouth 2 (two)  times daily.   sertraline 50 MG tablet Commonly known as: ZOLOFT 1 tablet daily x10 days, then 1.5 tablet daily   SYSTANE BALANCE OP Place 1 drop into both eyes 2 (two) times daily as needed (DRY EYES).   traZODone 50 MG tablet Commonly known as: DESYREL Take 1-2 tablets (50-100 mg total) by mouth at bedtime.           Objective:   Physical Exam BP (!) 147/70 (BP Location: Left Arm, Patient Position: Sitting, Cuff Size: Large)   Pulse 73   Temp (!) 97.5 F (36.4 C) (Skin)   Ht '5\' 6"'  (1.676 m)   Wt 232 lb (105.2 kg)   SpO2 99%   BMI 37.45 kg/m  General:   Well developed, NAD, BMI noted. HEENT:  Normocephalic . Face symmetric, atraumatic Lungs:  CTA B Normal respiratory effort, no intercostal retractions, no accessory muscle use. Heart: RRR,  no murmur.  No pretibial edema bilaterally  Skin: Not pale. Not jaundice Neurologic:  alert & oriented X3.  Speech normal, gait appropriate for age and unassisted Psych--  Cognition and judgment appear intact.  Cooperative with normal attention span and concentration.  Behavior appropriate. No anxious or depressed appearing.      Assessment    Assessment HTN Hyperlipidemia-  Pravachol, Crestor: Myalgias. Intolerant to Zetia (joint aches), see OV  06/2016  Depression, Anxiety (on clonazepam), insomnia (on trazodone):  depression x many  years, remotely on zoloft and other meds per psych, I rx lexapro 2015, then was rx effexor x a while Morbid obesity CV: CHF/ non ischemic cardiomyopathy dx 04-2017; cath normal coronaries,  EF ~ 30% Carotid artery disease: 1 to 39% bilateral carotids 09-2016, start aspirin per neurology GI:  --GERD, Barrett's esophagus, had a EGD 07/2018  --h/o persistent C. difficile diarrhea 2014 B12 deficiency Osteopenia: Dexa 2015 showed osteopenia, T score -1.9 (10/2016): Rx cs, vit D PULM: --NPSG 2008:  No osa, PLMS 4/hr with arousal and awakening --RLS - Requip  Migraines, f/u [facility] --  off topamax as off 06-2016 MSK: --Back pain, chronic: pain meds rx by ortho, Workers comp MD @ [employer] pain mngmt [place] --DJD,CTS B ([facility]) --Fibromyalgia.  See note from 02/14/2018 OAB, LUTS-- on myrbetriq Dr McDarmoth  Raynaud phenomena---- DX 07/2017  PLAN HTN: BP is a slightly elevated, increase metoprolol XL 50 mg to 75 mg daily, Rx sent.  Continue Entresto, Lasix as needed.  Monitor BPs. Generalized myalgias: Unchanged, see last visit, work-up negative. Depression, anxiety, insomnia: Started Zoloft 75 mg, doing much better, declined change of dose at this time.  Still has on and off issues with insomnia, for now will continue   trazodone.  (Not taking clonazepam) Fatigue: Ongoing problem, see office visit note from 05-2018, refer to Dr. Halford Chessman, repeat sleep study? Preventive care: Flu shot today RTC 4 months

## 2018-10-31 NOTE — Patient Instructions (Addendum)
  GO TO THE FRONT DESK Schedule your next appointment   for checkup in 4 months  Metoprolol XL 50 mg: Increase it to 1.5 mg every day.  Continue checking your blood pressure  BP GOAL is between 110/65 and  135/85. If it is consistently higher or lower, let me know

## 2018-11-01 NOTE — Assessment & Plan Note (Signed)
HTN: BP is a slightly elevated, increase metoprolol XL 50 mg to 75 mg daily, Rx sent.  Continue Entresto, Lasix as needed.  Monitor BPs. Generalized myalgias: Unchanged, see last visit, work-up negative. Depression, anxiety, insomnia: Started Zoloft 75 mg, doing much better, declined change of dose at this time.  Still has on and off issues with insomnia, for now will continue   trazodone.  (Not taking clonazepam) Fatigue: Ongoing problem, see office visit note from 05-2018, refer to Dr. Halford Chessman, repeat sleep study? Preventive care: Flu shot today RTC 4 months

## 2018-11-03 ENCOUNTER — Inpatient Hospital Stay (HOSPITAL_BASED_OUTPATIENT_CLINIC_OR_DEPARTMENT_OTHER): Admission: RE | Admit: 2018-11-03 | Payer: Medicare Other | Source: Ambulatory Visit

## 2018-11-05 ENCOUNTER — Other Ambulatory Visit: Payer: Self-pay | Admitting: Internal Medicine

## 2018-11-06 ENCOUNTER — Other Ambulatory Visit: Payer: Self-pay | Admitting: Internal Medicine

## 2018-11-16 ENCOUNTER — Telehealth: Payer: Self-pay | Admitting: *Deleted

## 2018-11-16 NOTE — Telephone Encounter (Signed)
YOUR CARDIOLOGY TEAM HAS ARRANGED FOR AN E-VISIT FOR YOUR APPOINTMENT - PLEASE REVIEW IMPORTANT INFORMATION BELOW SEVERAL DAYS PRIOR TO YOUR APPOINTMENT  Due to the recent COVID-19 pandemic, we are transitioning in-person office visits to tele-medicine visits in an effort to decrease unnecessary exposure to our patients, their families, and staff. These visits are billed to your insurance just like a normal visit is. We also encourage you to sign up for MyChart if you have not already done so. You will need a smartphone if possible. For patients that do not have this, we can still complete the visit using a regular telephone but do prefer a smartphone to enable video when possible. You may have a family member that lives with you that can help. If possible, we also ask that you have a blood pressure cuff and scale at home to measure your blood pressure, heart rate and weight prior to your scheduled appointment. Patients with clinical needs that need an in-person evaluation and testing will still be able to come to the office if absolutely necessary. If you have any questions, feel free to call our office.  YOUR PROVIDER WILL BE USING THE FOLLOWING PLATFORM TO COMPLETE YOUR VISIT: [email or web address]  2-3 DAYS BEFORE YOUR APPOINTMENT  You will receive a telephone call from one of our [place] team members - your caller ID may say "Unknown caller." If this is a video visit, we will walk you through how to get the video launched on your phone. We will remind you check your blood pressure, heart rate and weight prior to your scheduled appointment. If you have an Apple Watch or Kardia, please upload any pertinent ECG strips the day before or morning of your appointment to [place]. Our staff will also make sure you have reviewed the consent and agree to move forward with your scheduled tele-health visit.   THE DAY OF YOUR APPOINTMENT  Approximately 15 minutes prior to your scheduled appointment, you will receive a  telephone call from one of [place] team - your caller ID may say "Unknown caller."  Our staff will confirm medications, vital signs for the day and any symptoms you may be experiencing. Please have this information available prior to the time of visit start. It may also be helpful for you to have a pad of paper and pen handy for any instructions given during your visit. They will also walk you through joining the smartphone meeting if this is a video visit.  CONSENT FOR TELE-HEALTH VISIT - PLEASE REVIEW  I hereby voluntarily request, consent and authorize [facility] and its employed or contracted physicians, physician assistants, nurse practitioners or other licensed health care professionals (the Practitioner), to provide me with telemedicine health care services (the "Services") as deemed necessary by the treating Practitioner. I acknowledge and consent to receive the Services by the Practitioner via telemedicine. I understand that the telemedicine visit will involve communicating with the Practitioner through live audiovisual communication technology and the disclosure of certain medical information by electronic transmission. I acknowledge that I have been given the opportunity to request an in-person assessment or other available alternative prior to the telemedicine visit and am voluntarily participating in the telemedicine visit.  I understand that I have the right to withhold or withdraw my consent to the use of telemedicine in the course of my care at any time, without affecting my right to future care or treatment, and that the Practitioner or I may terminate the telemedicine visit at any time. I understand that  I have the right to inspect all information obtained and/or recorded in the course of the telemedicine visit and may receive copies of available information for a reasonable fee.  I understand that some of the potential risks of receiving the Services via telemedicine include:  [name] Delay  or interruption in medical evaluation due to technological equipment failure or disruption; . Information transmitted may not be sufficient (e.g. poor resolution of images) to allow for appropriate medical decision making by the Practitioner; and/or  . In rare instances, security protocols could fail, causing a breach of personal health information.  Furthermore, I acknowledge that it is my responsibility to provide information about my medical history, conditions and care that is complete and accurate to the best of my ability. I acknowledge that Practitioner's advice, recommendations, and/or decision may be based on factors not within their control, such as incomplete or inaccurate data provided by me or distortions of diagnostic images or specimens that may result from electronic transmissions. I understand that the practice of medicine is not an exact science and that Practitioner makes no warranties or guarantees regarding treatment outcomes. I acknowledge that I will receive a copy of this consent concurrently upon execution via email to the email address I last provided but may also request a printed copy by calling the office of [name].    I understand that my insurance will be billed for this visit.   I have read or had this consent read to me. . I understand the contents of this consent, which adequately explains the benefits and risks of the Services being provided via telemedicine.  . I have been provided ample opportunity to ask questions regarding this consent and the Services and have had my questions answered to my satisfaction. . I give my informed consent for the services to be provided through the use of telemedicine in my medical care  By participating in this telemedicine visit I agree to the above. 11/16/2018  PF,RN

## 2018-11-17 ENCOUNTER — Other Ambulatory Visit: Payer: Self-pay

## 2018-11-17 ENCOUNTER — Telehealth (INDEPENDENT_AMBULATORY_CARE_PROVIDER_SITE_OTHER): Payer: Medicare Other | Admitting: Cardiology

## 2018-11-17 VITALS — BP 166/96 | HR 75 | Ht 66.0 in | Wt 226.0 lb

## 2018-11-17 DIAGNOSIS — I10 Essential (primary) hypertension: Secondary | ICD-10-CM

## 2018-11-17 DIAGNOSIS — E782 Mixed hyperlipidemia: Secondary | ICD-10-CM | POA: Diagnosis not present

## 2018-11-17 DIAGNOSIS — I5042 Chronic combined systolic (congestive) and diastolic (congestive) heart failure: Secondary | ICD-10-CM | POA: Diagnosis not present

## 2018-11-17 DIAGNOSIS — I110 Hypertensive heart disease with heart failure: Secondary | ICD-10-CM

## 2018-11-17 DIAGNOSIS — I428 Other cardiomyopathies: Secondary | ICD-10-CM

## 2018-11-17 MED ORDER — ENTRESTO 97-103 MG PO TABS: 1.0000 | ORAL_TABLET | Freq: Two times a day (BID) | ORAL | 3 refills | Status: DC

## 2018-11-17 NOTE — Patient Instructions (Addendum)
Medication Instructions:  Your physician has recommended you make the following change in your medication: increase Entresto to 97/103 mg by mouth twice daily   If you need a refill on your cardiac medications before your next appointment, please call your pharmacy.   Lab work: Your physician recommends that you return for lab work in: 2 weeks--BMP.  Scheduled for October 23,2020 at 1:15  If you have labs (blood work) drawn today and your tests are completely normal, you will receive your results only by: [name] MyChart Message (if you have MyChart) OR . A paper copy in the mail If you have any lab test that is abnormal or we need to change your treatment, we will call you to review the results.  Testing/Procedures: none  Follow-Up: Your physician recommends that you schedule a  Telemedicine follow-up appointment in: 1 month with APP--Scheduled for November 11,2020 at 2:30

## 2018-11-17 NOTE — Progress Notes (Signed)
Virtual Visit via Video Note   This visit type was conducted due to national recommendations for restrictions regarding the COVID-19 Pandemic (e.g. social distancing) in an effort to limit this patient's exposure and mitigate transmission in our community.  Due to her co-morbid illnesses, this patient is at least at moderate risk for complications without adequate follow up.  This format is felt to be most appropriate for this patient at this time.  All issues noted in this document were discussed and addressed.  A limited physical exam was performed with this format.  Please refer to the patient's chart for her consent to telehealth for [facility].   Evaluation Performed:  Follow-up visit  Date:  11/17/2018   ID:  [name], DOB 11-15-1945, MRN [id number]  Patient Location: Home  Provider Location: Home  PCP:  Colon Branch, MD  Cardiologist:  Candee Furbish, MD  Electrophysiologist:  None   Chief Complaint: Here for follow-up of chronic systolic heart failure  History of Present Illness:    [name] is a 73 y.o. female who presents via audio/video conferencing for a telehealth visit today.  Here for the follow-up of heart failure.  She was last seen via telehealth visit in April.  She has EF 30-45%, no CAD on catheterization 05/06/2017 with upper normal right and left-sided filling pressures.  She has had complaints in the past of dizziness and Lasix was stopped especially when Entresto was being titrated..  Previously, orthostatics were normal.  Heart rate had increased in the past.  Neurology has evaluated as well.  Dizziness improved after decreasing the dose of Entresto back to the moderate dose of 49/51 and her Lasix was encouraged to be PRN.  Physical therapy.  Nutrition, weight loss.  She is also been diagnosed with vertigo.  Overall she seems to be doing quite well.  No fevers chills nausea vomiting syncope bleeding. Took BP 161/93, P 61.  Dr. Larose Kells recently: HTN: BP is  a slightly elevated, increase metoprolol XL 50 mg to 75 mg daily, Rx sent.  Continue Entresto, Lasix as needed.  Monitor BPs.  Her blood pressures are still remaining elevated.  160 today.  She gets her Entresto from [facility] - 125 dollars for 3 months.  She just got a recent supply.   Overall though she is feeling quite well.  No fevers chills nausea vomiting syncope bleeding.  She is excited about her 15 to 20 pound weight loss.  The patient does not have symptoms concerning for COVID-19 infection (fever, chills, cough, or new shortness of breath).    Past Medical History:  Diagnosis Date  . Allergy   . Anemia   . Anxiety   . Barrett's esophagus   . Bilateral carpal tunnel syndrome    Dr. Eddie Dibbles, having injection therapy  . C. difficile diarrhea 08/01/2012   severe 2014  . Carotid arterial disease ([place])   . Cataract    removed both eyes   . Chronic combined systolic and diastolic CHF (congestive heart failure) ([place])   . Chronic cystitis    Dr. Matilde Sprang  . Chronic lower back pain   . Chronic pain syndrome   . Depression   . Dizziness   . DJD (degenerative joint disease)    bilateral hands; knees  . Family history of anesthesia complication    Mother had severe N/V  . Fibromyalgia   . GERD (gastroesophageal reflux disease)   . H/O cardiac catheterization    (-) cath  12-2002  , cath again 2011 (-)  . History of colon polyps   . HTN (hypertension)   . Hyperlipidemia    past hx   . IBS (irritable bowel syndrome)   . Insomnia   . Migraine   . Morbid obesity ([place])   . Non-ischemic cardiomyopathy ([place])   . Osteoporosis    pt unsure of this  . RLS (restless legs syndrome)   . Vertigo   . Vitamin B 12 deficiency 04/09/2013   Past Surgical History:  Procedure Laterality Date  . Arm surgery Left    "don't remember what they did; arm wasn't broken"  . BILATERAL KNEE ARTHROSCOPY Bilateral   . BIOPSY  08/07/2018   Procedure: BIOPSY;  Surgeon: Ladene Artist, MD;   Location: [facility];  Service: Endoscopy;;  . CARDIAC CATHETERIZATION  01/22/10   clean cath  . CATARACT EXTRACTION W/ INTRAOCULAR LENS  IMPLANT, BILATERAL Bilateral   . CHOLECYSTECTOMY N/A 10/25/2016   Procedure: LAPAROSCOPIC CHOLECYSTECTOMY;  Surgeon: Georganna Skeans, MD;  Location: [place];  Service: General;  Laterality: N/A;  . COLONOSCOPY    . COLONOSCOPY W/ POLYPECTOMY    . ESOPHAGOGASTRODUODENOSCOPY (EGD) WITH PROPOFOL N/A 08/07/2018   Procedure: ESOPHAGOGASTRODUODENOSCOPY (EGD) WITH PROPOFOL;  Surgeon: Ladene Artist, MD;  Location: [facility];  Service: Endoscopy;  Laterality: N/A;  . FOOT SURGERY Bilateral    toenails removed; callus removed on right; hammertoes right"  . Knot     "removed from right neck; not a goiter"  . LUMBAR DISC SURGERY     L5 S1 anterior fusion  . OOPHORECTOMY    . RIGHT/LEFT HEART CATH AND CORONARY ANGIOGRAPHY N/A 05/06/2017   Procedure: RIGHT/LEFT HEART CATH AND CORONARY ANGIOGRAPHY;  Surgeon: Nelva Bush, MD;  Location: [facility];  Service: Cardiovascular;  Laterality: N/A;  . SHOULDER ARTHROSCOPY Right   . TOTAL KNEE ARTHROPLASTY  10/05/2011   Procedure: TOTAL KNEE ARTHROPLASTY;  Surgeon: Hessie Dibble, MD;  Location: [place];  Service: Orthopedics;  Laterality: Right;  . UPPER GASTROINTESTINAL ENDOSCOPY    . VAGINAL HYSTERECTOMY     for endometriosis     Current Meds  Medication Sig  . aspirin EC 81 MG tablet Take 81 mg by mouth daily.  . cholecalciferol (VITAMIN D) 1000 units tablet Take 1,000 Units by mouth daily.  [name] dicyclomine (BENTYL) 10 MG capsule Take 1 capsule (10 mg total) by mouth 3 (three) times daily before meals.  . furosemide (LASIX) 20 MG tablet TAKE 1 TABLET BY MOUTH EVERY DAY (Patient taking differently: Take 20 mg by mouth daily as needed for fluid. )  . HYDROcodone-acetaminophen (NORCO) 10-325 MG tablet Take 1 tablet by mouth every 6 (six) hours as needed (pain).   [name] loratadine (CLARITIN) 10 MG tablet Take 10  mg by mouth daily as needed for allergies.  [name] meclizine (ANTIVERT) 12.5 MG tablet Take 1-2 tablets (12.5-25 mg total) by mouth 3 (three) times daily as needed for dizziness.  . metoprolol succinate (TOPROL-XL) 50 MG 24 hr tablet 1.5 tablets a day  . NARCAN 4 MG/0.1ML LIQD nasal spray kit Place 1 spray into the nose once as needed (opioid od).   . nitroGLYCERIN (NITROSTAT) 0.4 MG SL tablet Place 1 tablet (0.4 mg total) under the tongue every 5 (five) minutes as needed for chest pain.  [name] omeprazole (PRILOSEC) 40 MG capsule TAKE 1 CAPSULE BY MOUTH TWICE A DAY  . Propylene Glycol (SYSTANE BALANCE OP) Place 1 drop into both eyes 2 (  two) times daily as needed (DRY EYES).   [name] rOPINIRole (REQUIP) 1 MG tablet Take 1 tablet (1 mg total) by mouth 2 (two) times daily.  . sertraline (ZOLOFT) 50 MG tablet Take 1.5 tablets (75 mg total) by mouth daily.  . traZODone (DESYREL) 50 MG tablet Take 1-2 tablets (50-100 mg total) by mouth at bedtime.  . [DISCONTINUED] sacubitril-valsartan (ENTRESTO) 49-51 MG Take 1 tablet by mouth 2 (two) times daily.     Allergies:   Dextromethorphan-guaifenesin, Morphine, and Penicillins   Social History   Tobacco Use  . Smoking status: Former Smoker    Packs/day: 0.50    Years: 10.00    Pack years: 5.00    Quit date: 02/09/1975    Years since quitting: 43.8  . Smokeless tobacco: Never Used  . Tobacco comment: started at age 66.   quit in the 70s.  Substance Use Topics  . Alcohol use: Yes    Alcohol/week: 0.0 standard drinks    Comment: 2 glasses of wine per year  . Drug use: No    Comment: CBD and hemp OIL      Family Hx: The patient's family history includes Breast cancer in her sister; CAD in her father; Colon cancer in her maternal grandfather and maternal uncle; Congestive Heart Failure in her father; Dementia in her father; Diabetes in an other family member; Esophageal cancer in her brother; Hepatitis C in her brother; Lung cancer in her mother; Stroke in her  father. There is no history of Rectal cancer, Stomach cancer, or Colon polyps.  ROS:   Please see the history of present illness.    Denies any fevers chills nausea vomiting syncope bleeding All other systems reviewed and are negative.   Prior CV studies:   The following studies were reviewed today:  Prior heart catheterization February 2019 with no CAD.  Reduced ejection fraction as below.  Labs/Other Tests and Data Reviewed:    EKG:  An ECG dated 05/05/2017 was personally reviewed today and demonstrated:  Sinus rhythm/sinus tachycardia rate 103 with right bundle branch block  Recent Labs: 12/16/2017: TSH 1.62 09/29/2018: ALT 8; BUN 13; Creatinine, Ser 0.89; Hemoglobin 12.7; Platelets 187.0; Potassium 4.1; Sodium 140   Recent Lipid Panel Lab Results  Component Value Date/Time   CHOL 148 10/30/2015 11:02 AM   TRIG 68.0 10/30/2015 11:02 AM   HDL 60.50 10/30/2015 11:02 AM   CHOLHDL 2 10/30/2015 11:02 AM   LDLCALC 74 10/30/2015 11:02 AM   LDLDIRECT 166.0 04/14/2010 03:39 PM    Wt Readings from Last 3 Encounters:  11/17/18 226 lb (102.5 kg)  10/31/18 232 lb (105.2 kg)  08/07/18 232 lb (105.2 kg)     Objective:    Vital Signs:  BP (!) 166/96   Pulse 75   Ht _0  (1.676 m)   Wt 226 lb (102.5 kg)   BMI 36.48 kg/m    Well nourished, well developed female in no acute distress. She is comfortable, alert and oriented x3, no difficulty breathing.  Normal respiratory effort.  ASSESSMENT & PLAN:    Chronic combined systolic and diastolic heart failure - On catheterization EF appeared to be in the 30 to 35% range but on echocardiogram appeared to be in the 40 to 45% range. -Entresto helping.  I think it makes sense for [place] to increase the Entresto to goal dose because of her still continued hypertension.  Since she just got a 20-monthsupply of her 49/51 dose, I have asked her to  double that up in the morning and in the evening.  She will be able to get the new prescription once  she is done.  These were expensive medications and I do not want her to waste them.  Lasix as needed.  Occasionally she will have swelling down to her ankles she states.  She will take her Lasix if needed at that situation.  No CAD on cath 04/2017.  Seems to be doing quite well, stable.  NYHA class I-II.  Morbid obesity -Continue to encourage weight loss.  Previously she was interested in perhaps her insurance plan helping with weight loss program however this is not paid for according to prior note.  Her son has bags of frozen vegetables.  This would be great.  She sometimes has been eating more macaroni and cheese.  Discussed decreasing carbohydrates.  It is hard for her to exercise because she is worried about falling.  She uses her cane.  It is hard for her to walk her little dogs.  Once again discussed dietary changes. Lost from 240. "Don't get hungry."  Great job with weight loss.  Essential hypertension -Continue to monitor.  Medications reviewed.  Now has a blood pressure cuff at home.  Seems to be working well.  I am increasing the Entresto to maximum dose.  Continue with the Toprol at 30 as Dr. Larose Kells directed  Carotid artery disease -1 to 39% bilateral, mild in 2018 carotid Dopplers.  Continue with secondary prevention.  Aspirin was placed.  Barrett's esophagus/IBS/GERD -Per GI, dicyclomine  Depression/anxiety/fibromyalgia - Medications reviewed.  Exercise.   COVID-19 Education: The signs and symptoms of COVID-19 were discussed with the patient and how to seek care for testing (follow up with PCP or arrange E-visit).  The importance of social distancing was discussed today.  Time:   Today, I have spent 20 minutes with the patient with telehealth technology discussing the above problems.     Medication Adjustments/Labs and Tests Ordered: Current medicines are reviewed at length with the patient today.  Concerns regarding medicines are outlined above.   Tests Ordered: Orders Placed  This Encounter  Procedures  . Basic Metabolic Panel (BMET)    Medication Changes: Meds ordered this encounter  Medications  . sacubitril-valsartan (ENTRESTO) 97-103 MG    Sig: Take 1 tablet by mouth 2 (two) times daily.    Dispense:  180 tablet    Refill:  3    Dose increase    Disposition:  Follow up in 1 month, can be virtual with Sharee Pimple. Checking labs with change in Entresto in 1-2 weeks.   Signed, Candee Furbish, MD  11/17/2018 9:32 AM     [facility]

## 2018-12-01 ENCOUNTER — Telehealth: Payer: Self-pay | Admitting: Gastroenterology

## 2018-12-01 ENCOUNTER — Ambulatory Visit: Payer: Medicare Other | Admitting: Cardiology

## 2018-12-01 ENCOUNTER — Other Ambulatory Visit: Payer: Medicare Other

## 2018-12-01 NOTE — Telephone Encounter (Signed)
Patient reports that she has worsening diarrhea and uncontrolled IBS .  She will increase her dicyclomine to 20 mg TID PRN and take imodium PRN.  Follow up arranged with Dr. Fuller Plan for 12/12/18

## 2018-12-05 ENCOUNTER — Other Ambulatory Visit: Payer: Self-pay

## 2018-12-05 ENCOUNTER — Other Ambulatory Visit: Payer: Medicare Other

## 2018-12-05 DIAGNOSIS — I5042 Chronic combined systolic (congestive) and diastolic (congestive) heart failure: Secondary | ICD-10-CM | POA: Diagnosis not present

## 2018-12-06 LAB — BASIC METABOLIC PANEL
BUN/Creatinine Ratio: 14 (ref 12–28)
BUN: 12 mg/dL (ref 8–27)
CO2: 25 mmol/L (ref 20–29)
Calcium: 8.5 mg/dL — ABNORMAL LOW (ref 8.7–10.3)
Chloride: 105 mmol/L (ref 96–106)
Creatinine, Ser: 0.85 mg/dL (ref 0.57–1.00)
GFR calc Af Amer: 78 mL/min/{1.73_m2} (ref >59)
GFR calc non Af Amer: 68 mL/min/{1.73_m2} (ref >59)
Glucose: 104 mg/dL — ABNORMAL HIGH (ref 65–99)
Potassium: 4.5 mmol/L (ref 3.5–5.2)
Sodium: 142 mmol/L (ref 134–144)

## 2018-12-12 ENCOUNTER — Ambulatory Visit: Payer: Medicare Other | Admitting: Gastroenterology

## 2018-12-14 NOTE — Progress Notes (Signed)
Virtual Visit via Telephone Note   This visit type was conducted due to national recommendations for restrictions regarding the COVID-19 Pandemic (e.g. social distancing) in an effort to limit this patient's exposure and mitigate transmission in our community.  Due to her co-morbid illnesses, this patient is at least at moderate risk for complications without adequate follow up.  This format is felt to be most appropriate for this patient at this time.  The patient did not have access to video technology/had technical difficulties with video requiring transitioning to audio format only (telephone).  All issues noted in this document were discussed and addressed.  No physical exam could be performed with this format.  Please refer to the patient's chart for her  consent to telehealth for [facility].   Date:  12/20/2018   ID:  [name], DOB [birth year], MRN [id number]  Patient Location: Home Provider Location: Home  PCP:  Colon Branch, MD  Cardiologist:  Candee Furbish, MD  Electrophysiologist:  None   Evaluation Performed:  Follow-Up Visit  Chief Complaint: Follow up, seen for Dr. Marlou Porch  History of Present Illness:    [name] is a 73 y.o. female with a history of chronic combined systolic and diastolic heart failure, hypertension, carotid artery disease, Barrett's esophagus, GERD, depression/anxiety/fibromyalgia.  She has a history of LVEF of 30 to 45% with no CAD on cardiac catheterization 05/06/2017 with upper normal right and left sided filling pressures.  Has history of dizziness in the past in which her Lasix was stopped especially with Entresto titration.  Orthostatics were normal.  Neurology was evaluating as well.  Dizziness improved after decreasing dose back down to moderate dose of 49/51 and Lasix was prescribed on an as needed basis.  She was last seen by Dr. Marlou Porch 11/17/2018 in which she was noted to be doing quite well with no recent fevers, chills,  nausea/vomiting or syncope.  BP was elevated at 161/93 with a HR of 61.  She was noted to have recently seen her PCP, Dr. Larose Kells in which her metoprolol XL was increased from 50 mg to 75 mg daily and she was continued on Entresto and Lasix as needed.  Given issues with hypertension plan was to increase Entresto to maximum goal dose.  She had recently picked up a 23-monthsupply of 49/51 in which Dr. SMarlou Porchasked her to double these for her a.m. and p.m. dosing.  Plan was for close follow-up giving med titration.   Today, she presents for telemedicine visit and is doing well.  Reports that she recently got a new BP cuff and is having trouble with its functioning at the moment.  She denies recent symptoms of dizziness or orthostatic symptoms.  Reports she has been doing very well with no shortness of breath, LE swelling or orthopnea symptoms.  Reinforced the importance of keeping regular BP checks given high dose Entresto however it sounds as if she is tolerating very well.  Lab work performed 12/05/2018 with normal creatinine function.  Denies chest pain or other anginal symptoms.  Discussed plan to follow-up echocardiogram in 3 to 4 months after high-dose Entresto initiation.   The patient does not have symptoms concerning for COVID-19 infection (fever, chills, cough, or new shortness of breath).   Past Medical History:  Diagnosis Date  . Allergy   . Anemia   . Anxiety   . Barrett's esophagus   . Bilateral carpal tunnel syndrome    Dr. PEddie Dibbles having injection therapy  .  C. difficile diarrhea 08/01/2012   severe 2014  . Carotid arterial disease ([place])   . Cataract    removed both eyes   . Chronic combined systolic and diastolic CHF (congestive heart failure) ([place])   . Chronic cystitis    Dr. Matilde Sprang  . Chronic lower back pain   . Chronic pain syndrome   . Depression   . Dizziness   . DJD (degenerative joint disease)    bilateral hands; knees  . Family history of anesthesia complication     Mother had severe N/V  . Fibromyalgia   . GERD (gastroesophageal reflux disease)   . H/O cardiac catheterization    (-) cath 12-2002  , cath again 2011 (-)  . History of colon polyps   . HTN (hypertension)   . Hyperlipidemia    past hx   . IBS (irritable bowel syndrome)   . Insomnia   . Migraine   . Morbid obesity ([place])   . Non-ischemic cardiomyopathy ([place])   . Osteoporosis    pt unsure of this  . RLS (restless legs syndrome)   . Vertigo   . Vitamin B 12 deficiency 04/09/2013   Past Surgical History:  Procedure Laterality Date  . Arm surgery Left    "don't remember what they did; arm wasn't broken"  . BILATERAL KNEE ARTHROSCOPY Bilateral   . BIOPSY  08/07/2018   Procedure: BIOPSY;  Surgeon: Ladene Artist, MD;  [facility]: [facility];  Service: Endoscopy;;  . CARDIAC CATHETERIZATION  01/22/10   clean cath  . CATARACT EXTRACTION W/ INTRAOCULAR LENS  IMPLANT, BILATERAL Bilateral   . CHOLECYSTECTOMY N/A 10/25/2016   Procedure: LAPAROSCOPIC CHOLECYSTECTOMY;  Surgeon: Georganna Skeans, MD;  Location: [place];  Service: General;  Laterality: N/A;  . COLONOSCOPY    . COLONOSCOPY W/ POLYPECTOMY    . ESOPHAGOGASTRODUODENOSCOPY (EGD) WITH PROPOFOL N/A 08/07/2018   Procedure: ESOPHAGOGASTRODUODENOSCOPY (EGD) WITH PROPOFOL;  Surgeon: Ladene Artist, MD;  [facility];  Service: Endoscopy;  Laterality: N/A;  . FOOT SURGERY Bilateral    toenails removed; callus removed on right; hammertoes right"  . Knot     "removed from right neck; not a goiter"  . LUMBAR DISC SURGERY     L5 S1 anterior fusion  . OOPHORECTOMY    . RIGHT/LEFT HEART CATH AND CORONARY ANGIOGRAPHY N/A 05/06/2017   Procedure: RIGHT/LEFT HEART CATH AND CORONARY ANGIOGRAPHY;  Surgeon: Nelva Bush, MD;  Location: [place] [facility];  Service: Cardiovascular;  Laterality: N/A;  . SHOULDER ARTHROSCOPY Right   . TOTAL KNEE ARTHROPLASTY  10/05/2011   Procedure: TOTAL KNEE ARTHROPLASTY;  Surgeon: Hessie Dibble, MD;   Location: [place];  Service: Orthopedics;  Laterality: Right;  . UPPER GASTROINTESTINAL ENDOSCOPY    . VAGINAL HYSTERECTOMY     for endometriosis     Current Meds  Medication Sig  . aspirin EC 81 MG tablet Take 81 mg by mouth daily.  . cholecalciferol (VITAMIN D) 1000 units tablet Take 1,000 Units by mouth daily.  [name] dicyclomine (BENTYL) 10 MG capsule Take 20 mg by mouth 4 (four) times daily -  before meals and at bedtime.  . furosemide (LASIX) 20 MG tablet Take 20 mg by mouth daily as needed for fluid or edema.  [name] HYDROcodone-acetaminophen (NORCO) 10-325 MG tablet Take 1-2 tablets by mouth daily.  [name] loratadine (CLARITIN) 10 MG tablet Take 10 mg by mouth daily as needed for allergies.  [name] meclizine (ANTIVERT) 12.5 MG tablet Take 1-2 tablets (12.5-25 mg  total) by mouth 3 (three) times daily as needed for dizziness.  . metoprolol succinate (TOPROL-XL) 50 MG 24 hr tablet 1.5 tablets a day  . NARCAN 4 MG/0.1ML LIQD nasal spray kit Place 1 spray into the nose once as needed (opioid od).   . nitroGLYCERIN (NITROSTAT) 0.4 MG SL tablet Place 1 tablet (0.4 mg total) under the tongue every 5 (five) minutes as needed for chest pain.  [name] omeprazole (PRILOSEC) 40 MG capsule TAKE 1 CAPSULE BY MOUTH TWICE A DAY  . Propylene Glycol (SYSTANE BALANCE OP) Place 1 drop into both eyes 2 (two) times daily as needed (DRY EYES).   [name] rOPINIRole (REQUIP) 1 MG tablet Take 1 tablet (1 mg total) by mouth 2 (two) times daily.  . sacubitril-valsartan (ENTRESTO) 97-103 MG Take 1 tablet by mouth 2 (two) times daily.  . sertraline (ZOLOFT) 50 MG tablet Take 1.5 tablets (75 mg total) by mouth daily.  . traZODone (DESYREL) 50 MG tablet Take 1-2 tablets (50-100 mg total) by mouth at bedtime.     Allergies:   Dextromethorphan-guaifenesin, Morphine, and Penicillins   Social History   Tobacco Use  . Smoking status: Former Smoker    Packs/day: 0.50    Years: 10.00    Pack years: 5.00    Quit date: 02/09/1975    Years since  quitting: 43.8  . Smokeless tobacco: Never Used  . Tobacco comment: started at age 49.   quit in the 59s.  Substance Use Topics  . Alcohol use: Yes    Alcohol/week: 0.0 standard drinks    Comment: 2 glasses of wine per year  . Drug use: No    Comment: CBD and hemp OIL      Family Hx: The patient's family history includes Breast cancer in her sister; CAD in her father; Colon cancer in her maternal grandfather and maternal uncle; Congestive Heart Failure in her father; Dementia in her father; Diabetes in an other family member; Esophageal cancer in her brother; Hepatitis C in her brother; Lung cancer in her mother; Stroke in her father. There is no history of Rectal cancer, Stomach cancer, or Colon polyps.  ROS:   Please see the history of present illness.     All other systems reviewed and are negative.  Prior CV studies:   The following studies were reviewed today:  Cardiac catheterization 05/06/2017: Conclusions: 1. No angiographically significant coronary artery disease. 2. Moderately to severely reduced left ventricular contraction (LVEF 30-35%), consistent with non-ischemic cardiomyopathy. 3. Upper normal to mildly elevated left heart, right heart, and pulmonary artery pressures. 4. Low normal to mildly decreased cardiac output/index.  Recommendations: 1. Optimize evidence-based heart failure therapy. 2. Primary prevention of coronary artery disease.  Echocardiogram 05/06/2017:  Study Conclusions  - Procedure narrative: Transthoracic echocardiography. Technically   difficult study with reduced echo windows. Intravenous contrast   (Definity) was administered. - Left ventricle: The cavity size was normal. Wall thickness was   increased in a pattern of moderate LVH. Systolic function was   mildly to moderately reduced. The estimated ejection fraction was   in the range of 40% to 45%. Inferoseptal hypokinesis. Doppler   parameters are consistent with abnormal left  ventricular   relaxation (grade 1 diastolic dysfunction). The E/e&' ratio is   between 8-15, suggesting indeterminate LV filling pressure. - Aortic valve: Sclerosis without stenosis. There was trivial   regurgitation. - Mitral valve: Mildly thickened leaflets . There was trivial   regurgitation. - Left atrium: The atrium was normal  in size. - Inferior vena cava: The vessel was normal in size. The   respirophasic diameter changes were in the normal range (>= 50%),   consistent with normal central venous pressure.  Impressions:  - Technically difficult study. Definity contrast given. Compared to   a prior echo in 2011, LVEF is lower at 40-45% with predominant   inferior and inferoseptal hypokinesis.  Labs/Other Tests and Data Reviewed:    EKG:  An ECG dated 05/04/2017 was personally reviewed today and demonstrated:  NSR HR 97 with RBBB  Recent Labs: 09/29/2018: ALT 8; Hemoglobin 12.7; Platelets 187.0 12/05/2018: BUN 12; Creatinine, Ser 0.85; Potassium 4.5; Sodium 142   Recent Lipid Panel Lab Results  Component Value Date/Time   CHOL 148 10/30/2015 11:02 AM   TRIG 68.0 10/30/2015 11:02 AM   HDL 60.50 10/30/2015 11:02 AM   CHOLHDL 2 10/30/2015 11:02 AM   LDLCALC 74 10/30/2015 11:02 AM   LDLDIRECT 166.0 04/14/2010 03:39 PM    Wt Readings from Last 3 Encounters:  12/20/18 226 lb (102.5 kg)  11/17/18 226 lb (102.5 kg)  10/31/18 232 lb (105.2 kg)     Objective:    Vital Signs:  Ht '5\' 6"'  (1.676 m)   Wt 226 lb (102.5 kg)   BMI 36.48 kg/m    VITAL SIGNS:  reviewed GEN:  no acute distress NEURO:  alert and oriented x 3, no obvious focal deficit PSYCH:  normal affect  ASSESSMENT & PLAN:    1.  Chronic combined systolic and diastolic HF: -On cardiac catheterization 04/2017 LVEF appeared to be in the 30 to 35% range with follow-up echocardiogram at 40 to 45%>> no CAD -Entresto increased to maximum dose given issues with hypertension -Told to double up on her 49/51 dosing  until supply out>>97/103 -Most recent creatinine was stable at 0.85 on 12/05/18 -No symptoms of orthostatic hypotension.  Patient unable to obtain home BP at this time given malfunctioning BP cuff.  [facility]ed [facility] the [facility] to spot check her blood pressure however seems to be tolerating very well -Continue Lasix PRN>>> reports no recent need for Lasix -Plan for follow-up echocardiogram and office visit in 3 months  2.  Morbid obesity: -Continue to encourage weight loss -Continue with low-carb diet  3.  Essential hypertension: -Elevated at last telemedicine visit 11/17/2018 in which Entresto increased to maximum dose-with continuation of Toprol-XL 75 mg daily per Dr. Larose Kells, PCP -Unable to obtain BP secondary to malfunctioning BP cuff.  Denies symptoms of hypertension including dizziness.  Patient will obtain new cuff if unable to troubleshoot problem.  Also encouraged to spot check at local [facility] or [facility] facility.  4.  Carotid artery disease: -1 to 39% bilaterally, mild in 2018 on carotid Dopplers -Continue with secondary prevention -No symptoms -Placed on ASA   COVID-19 Education: The signs and symptoms of COVID-19 were discussed with the patient and how to seek care for testing (follow up with PCP or arrange E-visit).  The importance of social distancing was discussed today.  Time:   Today, I have spent minutes with the patient with telehealth technology discussing the above problems.     Medication Adjustments/Labs and Tests Ordered: Current medicines are reviewed at length with the patient today.  Concerns regarding medicines are outlined above.   Tests Ordered: No orders of the defined types were placed in this encounter.   Medication Changes: No orders of the defined types were placed in this encounter.   Follow Up:  In Person Myself or Dr. Marlou Porch in 3 months  Signed, Kathyrn Drown, NP  12/20/2018 2:48 PM    [place]

## 2018-12-20 ENCOUNTER — Other Ambulatory Visit: Payer: Self-pay

## 2018-12-20 ENCOUNTER — Telehealth (INDEPENDENT_AMBULATORY_CARE_PROVIDER_SITE_OTHER): Payer: Medicare Other | Admitting: Cardiology

## 2018-12-20 VITALS — Ht 66.0 in | Wt 226.0 lb

## 2018-12-20 DIAGNOSIS — E782 Mixed hyperlipidemia: Secondary | ICD-10-CM

## 2018-12-20 DIAGNOSIS — I10 Essential (primary) hypertension: Secondary | ICD-10-CM

## 2018-12-20 DIAGNOSIS — I5042 Chronic combined systolic (congestive) and diastolic (congestive) heart failure: Secondary | ICD-10-CM

## 2018-12-20 DIAGNOSIS — I428 Other cardiomyopathies: Secondary | ICD-10-CM

## 2018-12-20 NOTE — Patient Instructions (Signed)
Medication Instructions:   Your physician recommends that you continue on your current medications as directed. Please refer to the Current Medication list given to you today.  *If you need a refill on your cardiac medications before your next appointment, please call your pharmacy*  Lab Work:  None ordered today  Testing/Procedures:  Your physician has requested that you have an echocardiogram on 03/12/2019 at 11:35AM. Echocardiography is a painless test that uses sound waves to create images of your heart. It provides your doctor with information about the size and shape of your heart and how well your heart's chambers and valves are working. This procedure takes approximately one hour. There are no restrictions for this procedure.  Follow-Up: At [facility], you and your health needs are our priority.  As part of our continuing mission to provide you with exceptional heart care, we have created designated Provider Care Teams.  These Care Teams  include your primary Cardiologist (physician) and Advanced Practice Providers (APPs -  Physician Assistants and Nurse Practitioners) who all work together to provide you with the care you need, when you need it.  Your next appointment:   03/23/2019 at 11:45AM with Kathyrn Drown, NP  The format for your next appointment:   Virtual Visit

## 2018-12-24 ENCOUNTER — Other Ambulatory Visit: Payer: Self-pay | Admitting: Cardiology

## 2019-01-23 ENCOUNTER — Other Ambulatory Visit: Payer: Self-pay | Admitting: Internal Medicine

## 2019-02-13 ENCOUNTER — Encounter: Payer: Self-pay | Admitting: Gastroenterology

## 2019-02-13 ENCOUNTER — Ambulatory Visit (INDEPENDENT_AMBULATORY_CARE_PROVIDER_SITE_OTHER): Payer: Medicare Other | Admitting: Gastroenterology

## 2019-02-13 VITALS — BP 148/90 | HR 72 | Temp 98.5°F | Ht 66.0 in | Wt 234.6 lb

## 2019-02-13 DIAGNOSIS — K2270 Barrett's esophagus without dysplasia: Secondary | ICD-10-CM | POA: Diagnosis not present

## 2019-02-13 DIAGNOSIS — K580 Irritable bowel syndrome with diarrhea: Secondary | ICD-10-CM | POA: Diagnosis not present

## 2019-02-13 MED ORDER — DICYCLOMINE HCL 20 MG PO TABS: 20.0000 mg | ORAL_TABLET | Freq: Three times a day (TID) | ORAL | 11 refills | Status: DC

## 2019-02-13 NOTE — Progress Notes (Signed)
    History of Present Illness: This is a 74 year old female with IBS, short segment Barrett's without dysplasia who relates frequent, urgent diarrhea.  We increased her dicyclomine to 20 mg and her symptoms have improved however she is taking it 3 times daily and not 4 times daily.  Imodium is helpful for control of diarrhea however it can occasionally cause constipation if she overuses it.  Her reflux symptoms are controlled.  Current Medications, Allergies, Past Medical History, Past Surgical History, Family History and Social History were reviewed in [employer] record.   Physical Exam: General: Well developed, well nourished, no acute distress Head: Normocephalic and atraumatic Eyes:  sclerae anicteric, EOMI Ears: Normal auditory acuity Mouth: No deformity or lesions Lungs: Clear throughout to auscultation Heart: Regular rate and rhythm; no murmurs, rubs or bruits Abdomen: Soft, mild diffuse tenderness to light palpation and non distended. No masses, hepatosplenomegaly or hernias noted. Normal Bowel sounds Rectal: Not done Musculoskeletal: Symmetrical with no gross deformities  Pulses:  Normal pulses noted Extremities: No clubbing, cyanosis, edema or deformities noted Neurological: Alert oriented x 4, grossly nonfocal Psychological:  Alert and cooperative. Normal mood and affect   Assessment and Recommendations:  1. IBS-D with occasional incontinence.  Take dicyclomine 20 mg 4 times daily, before meals and at bedtime.  Imodium twice daily as needed.  Minimize or avoid foods that exacerbate symptoms.  Trial of low FODMAP diet.  2. 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  Follow antireflux measures.  Continue omeprazole 40 mg twice daily.  Surveillance EGD recommended in June 2023.

## 2019-02-13 NOTE — Patient Instructions (Signed)
Increase your dicyclomine to 20 mg four times a day before meals and at bedtime. A new prescription has been sent to your pharmacy.   You have been given a low fod-map diet.   Thank you for choosing me and [facility].  Pricilla Riffle. Dagoberto Ligas., MD., [facility]  Due to recent changes in healthcare laws, you may see the results of your imaging and laboratory studies on MyChart before your provider has had a chance to review them.  We understand that in some cases there may be results that are confusing or concerning to you. Not all laboratory results come back in the same time frame and the provider may be waiting for multiple results in order to interpret others.  Please give [place] 48 hours in order for your provider to thoroughly review all the results before contacting the office for clarification of your results.

## 2019-02-15 ENCOUNTER — Ambulatory Visit (HOSPITAL_BASED_OUTPATIENT_CLINIC_OR_DEPARTMENT_OTHER)
Admission: RE | Admit: 2019-02-15 | Discharge: 2019-02-15 | Disposition: A | Discharge status: Home / Self Care | Payer: Medicare Other | Source: Ambulatory Visit | Attending: Internal Medicine | Admitting: Internal Medicine

## 2019-02-15 ENCOUNTER — Other Ambulatory Visit: Payer: Self-pay

## 2019-02-15 DIAGNOSIS — Z780 Asymptomatic menopausal state: Secondary | ICD-10-CM | POA: Insufficient documentation

## 2019-02-15 DIAGNOSIS — Z1231 Encounter for screening mammogram for malignant neoplasm of breast: Secondary | ICD-10-CM | POA: Diagnosis not present

## 2019-03-02 ENCOUNTER — Ambulatory Visit (INDEPENDENT_AMBULATORY_CARE_PROVIDER_SITE_OTHER): Payer: Medicare Other | Admitting: Internal Medicine

## 2019-03-02 VITALS — BP 161/91 | HR 81

## 2019-03-02 DIAGNOSIS — G2581 Restless legs syndrome: Secondary | ICD-10-CM | POA: Diagnosis not present

## 2019-03-02 DIAGNOSIS — I10 Essential (primary) hypertension: Secondary | ICD-10-CM | POA: Diagnosis not present

## 2019-03-02 DIAGNOSIS — R519 Headache, unspecified: Secondary | ICD-10-CM

## 2019-03-02 DIAGNOSIS — F419 Anxiety disorder, unspecified: Secondary | ICD-10-CM | POA: Diagnosis not present

## 2019-03-02 DIAGNOSIS — F329 Major depressive disorder, single episode, unspecified: Secondary | ICD-10-CM

## 2019-03-02 DIAGNOSIS — F32A Depression, unspecified: Secondary | ICD-10-CM

## 2019-03-02 NOTE — Progress Notes (Signed)
Subjective:    Patient ID: [name], female    DOB: January 23, 1946, 74 y.o.   MRN: [id number]  DOS:  03/02/2019 Type of visit - description: Virtual Visit via Video Note  I connected with the above patient  by a video enabled telemedicine application and verified that I am speaking with the correct person using two identifiers.   THIS ENCOUNTER IS A VIRTUAL VISIT DUE TO COVID-19 - PATIENT WAS NOT SEEN IN THE OFFICE. PATIENT HAS CONSENTED TO VIRTUAL VISIT / TELEMEDICINE VISIT   Location of patient: home  Location of provider: office  I discussed the limitations of evaluation and management by telemedicine and the availability of in person appointments. The patient expressed understanding and agreed to proceed.  Routine checkup Since the last office visit is doing about the same. Continue with low back pain. Continue with fatigue. Also, she reports a "pain at the scalp", she points to the corona of the head, is only in the scalp not really a headache. This is started 3 months ago and pain happens daily.  She specifically denies weight loss, fever chills. No jaw claudication. She does not know she has a rash there.  Ambulatory BP slightly elevated    Review of Systems See above  Also denies chest pain, lower extremity edema.  Occasionally is short of breath.  Past Medical History:  Diagnosis Date  . Allergy   . Anemia   . Anxiety   . Barrett's esophagus   . Bilateral carpal tunnel syndrome    Dr. Eddie Dibbles, having injection therapy  . C. difficile diarrhea 08/01/2012   severe 2014  . Carotid arterial disease ([place])   . Cataract    removed both eyes   . Chronic combined systolic and diastolic CHF (congestive heart failure) ([place])   . Chronic cystitis    Dr. Matilde Sprang  . Chronic lower back pain   . Chronic pain syndrome   . Depression   . Dizziness   . DJD (degenerative joint disease)    bilateral hands; knees  . Family history of anesthesia complication    Mother had  severe N/V  . Fibromyalgia   . GERD (gastroesophageal reflux disease)   . H/O cardiac catheterization    (-) cath 12-2002  , cath again 2011 (-)  . History of colon polyps   . HTN (hypertension)   . Hyperlipidemia    past hx   . IBS (irritable bowel syndrome)   . Insomnia   . Migraine   . Morbid obesity ([place])   . Non-ischemic cardiomyopathy ([place])   . Osteoporosis    pt unsure of this  . RLS (restless legs syndrome)   . Vertigo   . Vitamin B 12 deficiency 04/09/2013    Past Surgical History:  Procedure Laterality Date  . Arm surgery Left    "don't remember what they did; arm wasn't broken"  . BILATERAL KNEE ARTHROSCOPY Bilateral   . BIOPSY  08/07/2018   Procedure: BIOPSY;  Surgeon: Ladene Artist, MD;  [facility];  Service: Endoscopy;;  . CARDIAC CATHETERIZATION  01/22/10   clean cath  . CATARACT EXTRACTION W/ INTRAOCULAR LENS  IMPLANT, BILATERAL Bilateral   . CHOLECYSTECTOMY N/A 10/25/2016   Procedure: LAPAROSCOPIC CHOLECYSTECTOMY;  Surgeon: Georganna Skeans, MD;  Location: [name];  Service: General;  Laterality: N/A;  . COLONOSCOPY    . COLONOSCOPY W/ POLYPECTOMY    . ESOPHAGOGASTRODUODENOSCOPY (EGD) WITH PROPOFOL N/A 08/07/2018   Procedure: ESOPHAGOGASTRODUODENOSCOPY (EGD) WITH PROPOFOL;  Surgeon: Ladene Artist, MD;  Location: [facility];  Service: Endoscopy;  Laterality: N/A;  . FOOT SURGERY Bilateral    toenails removed; callus removed on right; hammertoes right"  . Knot     "removed from right neck; not a goiter"  . LUMBAR DISC SURGERY     L5 S1 anterior fusion  . OOPHORECTOMY    . RIGHT/LEFT HEART CATH AND CORONARY ANGIOGRAPHY N/A 05/06/2017   Procedure: RIGHT/LEFT HEART CATH AND CORONARY ANGIOGRAPHY;  Surgeon: Nelva Bush, MD;  Location: [place] [facility];  Service: Cardiovascular;  Laterality: N/A;  . SHOULDER ARTHROSCOPY Right   . TOTAL KNEE ARTHROPLASTY  10/05/2011   Procedure: TOTAL KNEE ARTHROPLASTY;  Surgeon: Hessie Dibble, MD;   Location: [place];  Service: Orthopedics;  Laterality: Right;  . UPPER GASTROINTESTINAL ENDOSCOPY    . VAGINAL HYSTERECTOMY     for endometriosis        Objective:   Physical Exam BP (!) 161/91   Pulse 81  This is a virtual video visit, she is alert oriented x3 and in no apparent distress    Assessment     Assessment HTN Hyperlipidemia-  Pravachol, Crestor: Myalgias. Intolerant to Zetia (joint aches), see OV  06/2016  Depression, Anxiety (on clonazepam), insomnia (on trazodone):  depression x many  years, remotely on zoloft and other meds per psych, I rx lexapro 2015, then was rx effexor x a while Morbid obesity CV: CHF/ non ischemic cardiomyopathy dx 04-2017; cath normal coronaries, EF ~ 30% Carotid artery disease: 1 to 39% bilateral carotids 09-2016, start aspirin per neurology GI:  --GERD, Barrett's esophagus, had a EGD 07/2018  --h/o persistent C. 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  See note from 02/14/2018 OAB, LUTS-- on myrbetriq Dr [place]oth  Raynaud phenomena---- DX 07/2017  PLAN HTN: Currently on Entresto, Lasix, Metoprolol.  BP today elevated when checked at home, few days ago at GI was also  elevated at 148/90.  Recommend to monitor BPs daily, write log and bring it in few days when she goes see cardiology. Depression, anxiety, insomnia: Controlled with medication. RLS: Relatively well controlled with Requip Generalized myalgias, back pain, fibromyalgia: Unchanged, previous work-up negative Scalp pain: New issue As described above, she knows the limitation of a virtual visit, to be sure we get a CMP, CBC and sed  rate to  rule out temporary arthritis.  If work-up negative will recommend observation RTC for labs at her earliest convenience RTC for routine checkup in 4 months   I discussed the assessment and treatment plan with the patient. The patient was provided an opportunity to ask questions and all were answered. The patient agreed with the plan and demonstrated an understanding of the instructions.   The patient was advised to call back or seek an in-person evaluation if the symptoms worsen or if the condition fails to improve as anticipated.

## 2019-03-02 NOTE — Assessment & Plan Note (Signed)
(-)   MMG 02/2019

## 2019-03-03 NOTE — Assessment & Plan Note (Addendum)
HTN: Currently on Entresto, Lasix, Metoprolol.  BP today elevated when checked at home, few days ago at GI was also  elevated at 148/90.  Recommend to monitor BPs daily, write log and bring it in few days when she goes see cardiology. Depression, anxiety, insomnia: Controlled with medication. RLS: Relatively well controlled with Requip Generalized myalgias, back pain, fibromyalgia: Unchanged, previous work-up negative Scalp pain: New issue As described above, she knows the limitation of a virtual visit, to be sure we get a CMP, CBC and sed rate to  rule out temporary arthritis.  If work-up negative will recommend observation RTC for labs at her earliest convenience RTC for routine checkup in 4 months

## 2019-03-12 ENCOUNTER — Other Ambulatory Visit (HOSPITAL_COMMUNITY): Payer: Medicare Other

## 2019-03-14 ENCOUNTER — Other Ambulatory Visit: Payer: Self-pay

## 2019-03-14 MED ORDER — OMEPRAZOLE 40 MG PO CPDR: 40.0000 mg | DELAYED_RELEASE_CAPSULE | Freq: Two times a day (BID) | ORAL | 3 refills | Status: DC

## 2019-03-15 ENCOUNTER — Other Ambulatory Visit (INDEPENDENT_AMBULATORY_CARE_PROVIDER_SITE_OTHER): Payer: Medicare Other

## 2019-03-15 ENCOUNTER — Other Ambulatory Visit: Payer: Self-pay

## 2019-03-15 DIAGNOSIS — R519 Headache, unspecified: Secondary | ICD-10-CM

## 2019-03-15 LAB — CBC WITH DIFFERENTIAL/PLATELET
Basophils Absolute: 0 10*3/uL (ref 0.0–0.1)
Basophils Relative: 0.7 % (ref 0.0–3.0)
Eosinophils Absolute: 0.1 10*3/uL (ref 0.0–0.7)
Eosinophils Relative: 1 % (ref 0.0–5.0)
HCT: 39.4 % (ref 36.0–46.0)
Hemoglobin: 13 g/dL (ref 12.0–15.0)
Lymphocytes Relative: 34.1 % (ref 12.0–46.0)
Lymphs Abs: 2.4 10*3/uL (ref 0.7–4.0)
MCHC: 33.1 g/dL (ref 30.0–36.0)
MCV: 90.5 fl (ref 78.0–100.0)
Monocytes Absolute: 0.4 10*3/uL (ref 0.1–1.0)
Monocytes Relative: 5.2 % (ref 3.0–12.0)
Neutro Abs: 4.1 10*3/uL (ref 1.4–7.7)
Neutrophils Relative %: 59 % (ref 43.0–77.0)
Platelets: 223 10*3/uL (ref 150.0–400.0)
RBC: 4.36 Mil/uL (ref 3.87–5.11)
RDW: 14.3 % (ref 11.5–15.5)
WBC: 7 10*3/uL (ref 4.0–10.5)

## 2019-03-15 LAB — COMPREHENSIVE METABOLIC PANEL
ALT: 11 U/L (ref 0–35)
AST: 13 U/L (ref 0–37)
Albumin: 4 g/dL (ref 3.5–5.2)
Alkaline Phosphatase: 93 U/L (ref 39–117)
BUN: 19 mg/dL (ref 6–23)
CO2: 30 mEq/L (ref 19–32)
Calcium: 9 mg/dL (ref 8.4–10.5)
Chloride: 104 mEq/L (ref 96–112)
Creatinine, Ser: 0.91 mg/dL (ref 0.40–1.20)
GFR: 60.36 mL/min (ref >60.00)
Glucose, Bld: 93 mg/dL (ref 70–99)
Potassium: 4.7 mEq/L (ref 3.5–5.1)
Sodium: 139 mEq/L (ref 135–145)
Total Bilirubin: 0.4 mg/dL (ref 0.2–1.2)
Total Protein: 7 g/dL (ref 6.0–8.3)

## 2019-03-15 LAB — SEDIMENTATION RATE: Sed Rate: 8 mm/hr (ref 0–30)

## 2019-03-22 ENCOUNTER — Other Ambulatory Visit (HOSPITAL_COMMUNITY): Payer: Medicare Other

## 2019-03-23 ENCOUNTER — Ambulatory Visit: Payer: Medicare Other | Admitting: Cardiology

## 2019-03-27 NOTE — Progress Notes (Addendum)
Cardiology Office Note   Date:  03/30/2019   ID:  [name], DOB [birth year], MRN [id number]  PCP:  Colon Branch, MD  Cardiologist:  Dr. Marlou Porch, MD   Chief Complaint  Patient presents with  . Follow-up   Pt seen today via telephone encounter. Patient verbally verified to proceed with telemedicine visit with myself. Total call time approximately 20 minutes. Patient location: Home; Provider location: Home    History of Present Illness: [name] is a 74 y.o. female who presents for 3 month follow up, seen for Dr. Marlou Porch.   Ms. [name] has a history of chronic combined systolic and diastolic heart failure, hypertension, carotid artery disease, Barrett's esophagus, GERD, depression/anxiety/fibromyalgia.  LVEF of 30 to 45% with no CAD on cardiac catheterization 05/06/2017 with upper normal right and left sided filling pressures.  Has history of dizziness in the past in which her Lasix was stopped especially with Entresto titration. Orthostatics were normal.  Neurology was evaluating as well. Dizziness improved after decreasing Entresto back down to moderate dose of 49/51 and Lasix was prescribed on an as needed basis.  She was last seen by Dr. Marlou Porch 11/17/2018 in which she was noted to be doing quite well with no recent fevers, chills, nausea/vomiting or syncope.  BP was elevated at 161/93 with a HR of 61.  She was noted to have recently seen her PCP, Dr. Larose Kells in which her metoprolol XL was increased from 50 mg to 75 mg daily and she was continued on Entresto and Lasix as needed.  Given issues with hypertension plan was to increase Entresto to maximum goal dose. She had recently picked up a 57-monthsupply of 49/51 in which Dr. SMarlou Porchasked her to double these for her a.m. and p.m. dosing. Plan was for close follow-up giving med titration.  She was last seen by myself in telemedicine visit 12/20/18. At that time, she had recently obtained a new BP cuff however was having trouble  with its functioning at the moment. We reinforced the importance of keeping regular BP checks given high dose Entresto however it sounded as if she was tolerating the medication change well. Lab work performed 12/05/2018 with normal creatinine function.   Discussed plan to follow-up echocardiogram in 3 to 4 months after high-dose Entresto initiation, scheduled for Monday, 04/02/2027.  Today, patient seen via telemedicine visit for follow-up.  BP is elevated today at 172/71 however patient reports that she has been very inconsistent with Toprol use, not taking her medication more than she is actually taking it.  She reports only taking her PPI approximately 1 time per week.  She has been having intermittent chest discomfort which appears to be atypical without exact exertional qualities.  Pain will happen randomly for which she is felt was related to her GERD.  We discussed uncontrolled BP can also cause similar symptoms therefore our plan is to consistently take antihypertensive medications along with good BP log and close follow-up.  If symptoms persist despite these actions, may plan for further investigation with stress testing versus coronary CTA however reassured with last LHC in 2019 with no CAD present.  She denies LE edema, orthopnea, palpitations, dizziness or syncope.   Past Medical History:  Diagnosis Date  . Allergy   . Anemia   . Anxiety   . Barrett's esophagus   . Bilateral carpal tunnel syndrome    Dr. PEddie Dibbles having injection therapy  . C. difficile diarrhea 08/01/2012   severe 2014  .  Carotid arterial disease ([place])   . Cataract    removed both eyes   . Chronic combined systolic and diastolic CHF (congestive heart failure) ([place])   . Chronic cystitis    Dr. Matilde Sprang  . Chronic lower back pain   . Chronic pain syndrome   . Depression   . Dizziness   . DJD (degenerative joint disease)    bilateral hands; knees  . Family history of anesthesia complication    Mother had severe  N/V  . Fibromyalgia   . GERD (gastroesophageal reflux disease)   . H/O cardiac catheterization    (-) cath 12-2002  , cath again 2011 (-)  . History of colon polyps   . HTN (hypertension)   . Hyperlipidemia    past hx   . IBS (irritable bowel syndrome)   . Insomnia   . Migraine   . Morbid obesity ([place])   . Non-ischemic cardiomyopathy ([place])   . Osteoporosis    pt unsure of this  . RLS (restless legs syndrome)   . Vertigo   . Vitamin B 12 deficiency 04/09/2013    Past Surgical History:  Procedure Laterality Date  . Arm surgery Left    "don't remember what they did; arm wasn't broken"  . BILATERAL KNEE ARTHROSCOPY Bilateral   . BIOPSY  08/07/2018   Procedure: BIOPSY;  Surgeon: Ladene Artist, MD;  Location: [facility];  Service: Endoscopy;;  . CARDIAC CATHETERIZATION  01/22/10   clean cath  . CATARACT EXTRACTION W/ INTRAOCULAR LENS  IMPLANT, BILATERAL Bilateral   . CHOLECYSTECTOMY N/A 10/25/2016   Procedure: LAPAROSCOPIC CHOLECYSTECTOMY;  Surgeon: Georganna Skeans, MD;  Location: [facility];  Service: General;  Laterality: N/A;  . COLONOSCOPY    . COLONOSCOPY W/ POLYPECTOMY    . ESOPHAGOGASTRODUODENOSCOPY (EGD) WITH PROPOFOL N/A 08/07/2018   Procedure: ESOPHAGOGASTRODUODENOSCOPY (EGD) WITH PROPOFOL;  Surgeon: Ladene Artist, MD;  Location: [facility];  Service: Endoscopy;  Laterality: N/A;  . FOOT SURGERY Bilateral    toenails removed; callus removed on right; hammertoes right"  . Knot     "removed from right neck; not a goiter"  . LUMBAR DISC SURGERY     L5 S1 anterior fusion  . OOPHORECTOMY    . RIGHT/LEFT HEART CATH AND CORONARY ANGIOGRAPHY N/A 05/06/2017   Procedure: RIGHT/LEFT HEART CATH AND CORONARY ANGIOGRAPHY;  Surgeon: Nelva Bush, MD;  Location: [facility];  Service: Cardiovascular;  Laterality: N/A;  . SHOULDER ARTHROSCOPY Right   . TOTAL KNEE ARTHROPLASTY  10/05/2011   Procedure: TOTAL KNEE ARTHROPLASTY;  Surgeon: Hessie Dibble, MD;  Location: [facility];  Service: Orthopedics;  Laterality: Right;  . UPPER GASTROINTESTINAL ENDOSCOPY    . VAGINAL HYSTERECTOMY     for endometriosis     Current Outpatient Medications  Medication Sig Dispense Refill  . aspirin EC 81 MG tablet Take 81 mg by mouth daily.    . cholecalciferol (VITAMIN D) 1000 units tablet Take 1,000 Units by mouth daily.    [name] dicyclomine (BENTYL) 20 MG tablet Take 1 tablet (20 mg total) by mouth 4 (four) times daily -  before meals and at bedtime. 120 tablet 11  . furosemide (LASIX) 20 MG tablet Take 20 mg by mouth as needed for fluid or edema.    [name] HYDROcodone-acetaminophen (NORCO) 10-325 MG tablet Take 1-2 tablets by mouth daily.    [name] loratadine (CLARITIN) 10 MG tablet Take 10 mg by mouth daily as needed for allergies.    [name] meclizine (  ANTIVERT) 12.5 MG tablet Take 1-2 tablets (12.5-25 mg total) by mouth 3 (three) times daily as needed for dizziness. 30 tablet 0  . metoprolol succinate (TOPROL-XL) 50 MG 24 hr tablet TAKE 1 AND 1/2 TABLET BY MOUTH EVERY DAY 135 tablet 1  . NARCAN 4 MG/0.1ML LIQD nasal spray kit Place 1 spray into the nose once as needed (opioid od).     . nitroGLYCERIN (NITROSTAT) 0.4 MG SL tablet Place 1 tablet (0.4 mg total) under the tongue every 5 (five) minutes as needed for chest pain. 30 tablet 0  . omeprazole (PRILOSEC) 40 MG capsule Take 1 capsule (40 mg total) by mouth 2 (two) times daily. 180 capsule 3  . Propylene Glycol (SYSTANE BALANCE OP) Place 1 drop into both eyes 2 (two) times daily as needed (DRY EYES).     [name] rOPINIRole (REQUIP) 1 MG tablet Take 1 tablet (1 mg total) by mouth 2 (two) times daily. 180 tablet 1  . sacubitril-valsartan (ENTRESTO) 97-103 MG Take 1 tablet by mouth 2 (two) times daily. 180 tablet 3  . sertraline (ZOLOFT) 50 MG tablet Take 1.5 tablets (75 mg total) by mouth daily. 135 tablet 1  . traZODone (DESYREL) 50 MG tablet Take 1-2 tablets (50-100 mg total) by mouth at bedtime. 180 tablet 1   No current facility-administered  medications for this visit.    Allergies:   Dextromethorphan-guaifenesin, Morphine, and Penicillins    Social History:  The patient  reports that she quit smoking about 44 years ago. She has a 5.00 pack-year smoking history. She has never used smokeless tobacco. She reports current alcohol use. She reports that she does not use drugs.   Family History:  The patient's family history includes Breast cancer in her sister; CAD in her father; Colon cancer in her maternal grandfather and maternal uncle; Congestive Heart Failure in her father; Dementia in her father; Diabetes in an other family member; Esophageal cancer in her brother; Hepatitis C in her brother; Lung cancer in her mother; Stroke in her father.    ROS:  Please see the history of present illness. Otherwise, review of systems are positive for none.  All other systems are reviewed and negative.    PHYSICAL EXAM: VS:  BP (!) 172/71   Pulse 67   Ht _0  (1.676 m)   Wt 232 lb (105.2 kg)   BMI 37.45 kg/m  , BMI Body mass index is 37.45 kg/m.   General: Well developed, well nourished, NAD Neuro: Alert and oriented. Psych: Responds to questions appropriately with normal affect.    EKG:  EKG is not ordered today.   Recent Labs: 03/15/2019: ALT 11; BUN 19; Creatinine, Ser 0.91; Hemoglobin 13.0; Platelets 223.0; Potassium 4.7; Sodium 139    Lipid Panel    Component Value Date/Time   CHOL 148 10/30/2015 1102   TRIG 68.0 10/30/2015 1102   HDL 60.50 10/30/2015 1102   CHOLHDL 2 10/30/2015 1102   VLDL 13.6 10/30/2015 1102   LDLCALC 74 10/30/2015 1102   LDLDIRECT 166.0 04/14/2010 1539     Wt Readings from Last 3 Encounters:  03/30/19 232 lb (105.2 kg)  02/13/19 234 lb 9.6 oz (106.4 kg)  12/20/18 226 lb (102.5 kg)     Other studies Reviewed: Additional studies/ records that were reviewed today include:   Cardiac catheterization 05/06/2017: Conclusions: 1. No angiographically significant coronary artery  disease. 2. Moderately to severely reduced left ventricular contraction (LVEF 30-35%), consistent with non-ischemic cardiomyopathy. 3. Upper normal to mildly  elevated left heart, right heart, and pulmonary artery pressures. 4. Low normal to mildly decreased cardiac output/index.  Recommendations: 1. Optimize evidence-based heart failure therapy. 2. Primary prevention of coronary artery disease.  Echocardiogram 05/06/2017:  Study Conclusions  - Procedure narrative: Transthoracic echocardiography. Technically difficult study with reduced echo windows. Intravenous contrast (Definity) was administered. - Left ventricle: The cavity size was normal. Wall thickness was increased in a pattern of moderate LVH. Systolic function was mildly to moderately reduced. The estimated ejection fraction was in the range of 40% to 45%. Inferoseptal hypokinesis. Doppler parameters are consistent with abnormal left ventricular relaxation (grade 1 diastolic dysfunction). The E/e&' ratio is between 8-15, suggesting indeterminate LV filling pressure. - Aortic valve: Sclerosis without stenosis. There was trivial regurgitation. - Mitral valve: Mildly thickened leaflets . There was trivial regurgitation. - Left atrium: The atrium was normal in size. - Inferior vena cava: The vessel was normal in size. The respirophasic diameter changes were in the normal range (>= 50%), consistent with normal central venous pressure.  Impressions:  - Technically difficult study. Definity contrast given. Compared to a prior echo in 2011, LVEF is lower at 40-45% with predominant inferior and inferoseptal hypokinesis.  ASSESSMENT AND PLAN:  1.  Chronic combined systolic and diastolic HF: -On cardiac catheterization 04/2017 LVEF appeared to be in the 30 to 35% range with follow-up echocardiogram at 40 to 45%>> no CAD -Entresto increased to maximum dose given issues with  hypertension -Continue Lasix PRN>>> reports no recent need for Lasix -Plan for follow-up echocardiogram 04/02/2019  2.  Chest pain: -Patient reports intermittent chest pain without radiation or associated symptoms.  Underwent a cardiac cath as above 04/26/2017 with no CAD -BP very elevated on visit today at 172/71.  We will plan for adequate BP control and reassess for chest pain symptoms.  She already has an echocardiogram scheduled for Monday, 04/02/2019 to reevaluate LV function.  We will also assess for wall motion abnormalities. -If chest pain continues despite adequate BP control, plan for stress test versus coronary CTA however low suspicion for ACS given reassuring catheter as above  3.  Essential hypertension: -Elevated, 172/71 -Patient reports she has been inconsistent with Toprol XL 75 mg p.o. daily.  She states she does not take it more than she takes it at this point and is taking her BP approximately 1 time per week.   -Discussed medication reminders and BP log tracking -Plan for telemedicine close follow-up -If BP continues to be elevated we will plan for increased Toprol versus adding BiDil?  4.  Carotid artery disease: -1 to 39% bilaterally, mild in 2018 on carotid Dopplers -Continue with secondary prevention -No symptoms -Placed on ASA  Current medicines are reviewed at length with the patient today.  The patient does not have concerns regarding medicines.  The following changes have been made:  no change  Labs/ tests ordered today include: None  No orders of the defined types were placed in this encounter.   Disposition:   FU with myself in 1 week  Signed, Kathyrn Drown, NP  03/30/2019 1:58 PM    [facility] [address], [place], [place]  [address] Phone: [phone]; Fax: [phone]

## 2019-03-29 ENCOUNTER — Ambulatory Visit: Payer: Medicare Other | Admitting: Cardiology

## 2019-03-30 ENCOUNTER — Telehealth (INDEPENDENT_AMBULATORY_CARE_PROVIDER_SITE_OTHER): Payer: Medicare Other | Admitting: Cardiology

## 2019-03-30 ENCOUNTER — Other Ambulatory Visit: Payer: Self-pay

## 2019-03-30 ENCOUNTER — Encounter: Payer: Self-pay | Admitting: Cardiology

## 2019-03-30 VITALS — BP 172/71 | HR 67 | Ht 66.0 in | Wt 232.0 lb

## 2019-03-30 DIAGNOSIS — R0789 Other chest pain: Secondary | ICD-10-CM

## 2019-03-30 DIAGNOSIS — I10 Essential (primary) hypertension: Secondary | ICD-10-CM

## 2019-03-30 DIAGNOSIS — I5042 Chronic combined systolic (congestive) and diastolic (congestive) heart failure: Secondary | ICD-10-CM | POA: Diagnosis not present

## 2019-03-30 DIAGNOSIS — K219 Gastro-esophageal reflux disease without esophagitis: Secondary | ICD-10-CM | POA: Diagnosis not present

## 2019-03-30 DIAGNOSIS — I428 Other cardiomyopathies: Secondary | ICD-10-CM

## 2019-03-30 DIAGNOSIS — Z87891 Personal history of nicotine dependence: Secondary | ICD-10-CM

## 2019-03-30 DIAGNOSIS — R079 Chest pain, unspecified: Secondary | ICD-10-CM

## 2019-03-30 NOTE — Patient Instructions (Addendum)
Medication Instructions:   Your physician recommends that you continue on your current medications as directed. Please refer to the Current Medication list given to you today.  *If you need a refill on your cardiac medications before your next appointment, please call your pharmacy*  Lab Work:  None ordered today  If you have labs (blood work) drawn today and your tests are completely normal, you will receive your results only by: [name] MyChart Message (if you have MyChart) OR . A paper copy in the mail If you have any lab test that is abnormal or we need to change your treatment, we will call you to review the results.  Testing/Procedures:  None ordered today  Follow-Up: At [facility], you and your health needs are our priority.  As part of our continuing mission to provide you with exceptional heart care, we have created designated Provider Care Teams.  These Care Teams include your primary Cardiologist (physician) and Advanced Practice Providers (APPs -  Physician Assistants and Nurse Practitioners) who all work together to provide you with the care you need, when you need it.  Your next appointment:    On 04/13/19 at 10:15AM with Alexa Drown, NP

## 2019-04-02 ENCOUNTER — Ambulatory Visit (HOSPITAL_COMMUNITY): Payer: Medicare Other | Attending: Cardiovascular Disease

## 2019-04-02 ENCOUNTER — Other Ambulatory Visit: Payer: Self-pay

## 2019-04-02 DIAGNOSIS — I428 Other cardiomyopathies: Secondary | ICD-10-CM | POA: Diagnosis not present

## 2019-04-02 DIAGNOSIS — Z87891 Personal history of nicotine dependence: Secondary | ICD-10-CM | POA: Insufficient documentation

## 2019-04-02 DIAGNOSIS — E669 Obesity, unspecified: Secondary | ICD-10-CM | POA: Diagnosis not present

## 2019-04-02 DIAGNOSIS — I351 Nonrheumatic aortic (valve) insufficiency: Secondary | ICD-10-CM | POA: Diagnosis not present

## 2019-04-02 DIAGNOSIS — I110 Hypertensive heart disease with heart failure: Secondary | ICD-10-CM | POA: Diagnosis not present

## 2019-04-02 DIAGNOSIS — I509 Heart failure, unspecified: Secondary | ICD-10-CM | POA: Diagnosis not present

## 2019-04-02 DIAGNOSIS — Z8249 Family history of ischemic heart disease and other diseases of the circulatory system: Secondary | ICD-10-CM | POA: Insufficient documentation

## 2019-04-07 NOTE — Progress Notes (Signed)
Virtual Visit via Telephone Note   This visit type was conducted due to national recommendations for restrictions regarding the COVID-19 Pandemic (e.g. social distancing) in an effort to limit this patient's exposure and mitigate transmission in our community.  Due to her co-morbid illnesses, this patient is at least at moderate risk for complications without adequate follow up.  This format is felt to be most appropriate for this patient at this time.  The patient did not have access to video technology/had technical difficulties with video requiring transitioning to audio format only (telephone).  All issues noted in this document were discussed and addressed.  No physical exam could be performed with this format.  Please refer to the patient's chart for her  consent to telehealth for [facility].   Date:  04/13/2019   ID:  [name], DOB 05-Jan-1946, MRN [id number]  Patient Location: Home Provider Location: Home  PCP:  Colon Branch, MD  Cardiologist:  Candee Furbish, MD  Electrophysiologist:  None   Evaluation Performed:  Follow-Up Visit  Chief Complaint:  Follow up HTN  History of Present Illness:    [name] is a 74 y.o. female with a history of chronic combined systolic and diastolic heart failure, hypertension, carotid artery disease, Barrett's esophagus, GERD, depression/anxiety/fibromyalgia.  LVEF of 30 to 45% with no CAD on cardiac catheterization 05/06/2017 with upper normal right and left sided filling pressures.  Has history of dizziness in the past in which her Lasix was stopped especially with Entresto titration. Orthostatics were normal. Neurology was evaluating as well. Dizziness improved after decreasing Entresto back down to moderate dose of 49/51 and Lasix was prescribed on anas neededbasis.  She was last seen by Dr. Marlou Porch 11/17/2018 in which she was noted to be doing quite well with no recent fevers, chills, nausea/vomiting or syncope. BP was elevated at  161/93 with a HR of 61. She was noted to have recently seen her PCP, Dr. Andree Coss which her metoprolol XL was increased from 50 mg to 75 mg daily and she was continued on Entresto and Lasix as needed.  Given issues with hypertension plan was to increase Entresto to maximum goal dose. She had recently picked upa 16-monthsupply of 49/51 in which Dr. SMarlou Porchasked her to double these for her a.m. and p.m. dosing. Plan was for close follow-up giving med titration.  She was last seen by myself in telemedicine visit 12/20/18. At that time, she had recently obtained a new BP cuff however was having trouble with its functioning at the moment. We reinforced the importance of keeping regular BP checks given high dose Entresto however it sounded as if she was tolerating the medication change well. Lab work performed 12/05/2018 with normal creatinine function.   Discussed plan to follow-up echocardiogram in 3 to 4 months after high-dose Entresto initiation, scheduled for Monday, 04/02/2027 which showed improvement in LV function up to 65% also with G1 DD and normal valvular function.  BP was elevated at last telemedicine visit at 172/71 however patient reported that she was very inconsistent with Toprol use, not taking her medication more than she is actually taking it. She also reported only taking PPI approximately 1 time per week and was having intermittent chest pain which appeared to be atypical without exertional qualities.  We discussed uncontrolled BP can also cause similar symptoms therefore our plan is to consistently take antihypertensive medications along with good BP log and close follow-up.  If symptoms persist despite these actions,  may plan for further investigation with stress testing versus coronary CTA however reassured with last LHC in 2019 with no CAD present.  She denies LE edema, orthopnea, palpitations, dizziness or syncope.   Today, [name] states that BPs continue to be labile.  She is  controlled today however reports some days this will spike 170/80's.  Still having some intermittent chest pressure however BPs do not seem to be very well controlled at this time.  I am reassured with her last cardiac cath from 04/26/2017 with no CAD.  We discussed adding additional therapies today with close follow-up.  If she continues to have pressure symptoms we will possibly pursue stress testing versus coronary CT scanning however I have low suspicion for ACS at this time.  She denies shortness of breath, palpitations, LE edema, orthopnea, dizziness or syncope.  Reports that she is taking her medications consistently.   The patient does not have symptoms concerning for COVID-19 infection (fever, chills, cough, or new shortness of breath).    Past Medical History:  Diagnosis Date  . Allergy   . Anemia   . Anxiety   . Barrett's esophagus   . Bilateral carpal tunnel syndrome    Dr. Eddie Dibbles, having injection therapy  . C. difficile diarrhea 08/01/2012   severe 2014  . Carotid arterial disease ([place])   . Cataract    removed both eyes   . Chronic combined systolic and diastolic CHF (congestive heart failure) ([place])   . Chronic cystitis    Dr. Matilde Sprang  . Chronic lower back pain   . Chronic pain syndrome   . Depression   . Dizziness   . DJD (degenerative joint disease)    bilateral hands; knees  . Family history of anesthesia complication    Mother had severe N/V  . Fibromyalgia   . GERD (gastroesophageal reflux disease)   . H/O cardiac catheterization    (-) cath 12-2002  , cath again 2011 (-)  . History of colon polyps   . HTN (hypertension)   . Hyperlipidemia    past hx   . IBS (irritable bowel syndrome)   . Insomnia   . Migraine   . Morbid obesity ([place])   . Non-ischemic cardiomyopathy ([place])   . Osteoporosis    pt unsure of this  . RLS (restless legs syndrome)   . Vertigo   . Vitamin B 12 deficiency 04/09/2013   Past Surgical History:  Procedure Laterality Date  . Arm  surgery Left    "don't remember what they did; arm wasn't broken"  . BILATERAL KNEE ARTHROSCOPY Bilateral   . BIOPSY  08/07/2018   Procedure: BIOPSY;  Surgeon: Ladene Artist, MD;  Location: [facility];  Service: Endoscopy;;  . CARDIAC CATHETERIZATION  01/22/10   clean cath  . CATARACT EXTRACTION W/ INTRAOCULAR LENS  IMPLANT, BILATERAL Bilateral   . CHOLECYSTECTOMY N/A 10/25/2016   Procedure: LAPAROSCOPIC CHOLECYSTECTOMY;  Surgeon: Georganna Skeans, MD;  Location: [facility];  Service: General;  Laterality: N/A;  . COLONOSCOPY    . COLONOSCOPY W/ POLYPECTOMY    . ESOPHAGOGASTRODUODENOSCOPY (EGD) WITH PROPOFOL N/A 08/07/2018   Procedure: ESOPHAGOGASTRODUODENOSCOPY (EGD) WITH PROPOFOL;  Surgeon: Ladene Artist, MD;  Location: [facility];  Service: Endoscopy;  Laterality: N/A;  . FOOT SURGERY Bilateral    toenails removed; callus removed on right; hammertoes right"  . Knot     "removed from right neck; not a goiter"  . LUMBAR DISC SURGERY     L5 S1 anterior fusion  .  OOPHORECTOMY    . RIGHT/LEFT HEART CATH AND CORONARY ANGIOGRAPHY N/A 05/06/2017   Procedure: RIGHT/LEFT HEART CATH AND CORONARY ANGIOGRAPHY;  Surgeon: Nelva Bush, MD;  Location: [facility];  Service: Cardiovascular;  Laterality: N/A;  . SHOULDER ARTHROSCOPY Right   . TOTAL KNEE ARTHROPLASTY  10/05/2011   Procedure: TOTAL KNEE ARTHROPLASTY;  Surgeon: Hessie Dibble, MD;  Location: [facility];  Service: Orthopedics;  Laterality: Right;  . UPPER GASTROINTESTINAL ENDOSCOPY    . VAGINAL HYSTERECTOMY     for endometriosis     Current Meds  Medication Sig  . aspirin EC 81 MG tablet Take 81 mg by mouth daily.  . cholecalciferol (VITAMIN D) 1000 units tablet Take 1,000 Units by mouth daily.  [name] dicyclomine (BENTYL) 20 MG tablet Take 1 tablet (20 mg total) by mouth 4 (four) times daily -  before meals and at bedtime.  . furosemide (LASIX) 20 MG tablet Take 20 mg by mouth as needed for fluid or edema.  [name]  HYDROcodone-acetaminophen (NORCO) 10-325 MG tablet Take 1-2 tablets by mouth daily.  [name] loratadine (CLARITIN) 10 MG tablet Take 10 mg by mouth daily as needed for allergies.  [name] meclizine (ANTIVERT) 12.5 MG tablet Take 1-2 tablets (12.5-25 mg total) by mouth 3 (three) times daily as needed for dizziness.  . metoprolol succinate (TOPROL-XL) 50 MG 24 hr tablet TAKE 1 AND 1/2 TABLET BY MOUTH EVERY DAY  . NARCAN 4 MG/0.1ML LIQD nasal spray kit Place 1 spray into the nose once as needed (opioid od).   . nitroGLYCERIN (NITROSTAT) 0.4 MG SL tablet Place 1 tablet (0.4 mg total) under the tongue every 5 (five) minutes as needed for chest pain.  [name] omeprazole (PRILOSEC) 40 MG capsule Take 1 capsule (40 mg total) by mouth 2 (two) times daily.  [name] Propylene Glycol (SYSTANE BALANCE OP) Place 1 drop into both eyes 2 (two) times daily as needed (DRY EYES).   [name] rOPINIRole (REQUIP) 1 MG tablet Take 1 tablet (1 mg total) by mouth 2 (two) times daily.  . sacubitril-valsartan (ENTRESTO) 97-103 MG Take 1 tablet by mouth 2 (two) times daily.  . sertraline (ZOLOFT) 50 MG tablet Take 1.5 tablets (75 mg total) by mouth daily.  . traZODone (DESYREL) 50 MG tablet Take 1-2 tablets (50-100 mg total) by mouth at bedtime.     Allergies:   Dextromethorphan-guaifenesin, Morphine, and Penicillins   Social History   Tobacco Use  . Smoking status: Former Smoker    Packs/day: 0.50    Years: 10.00    Pack years: 5.00    Quit date: 02/09/1975    Years since quitting: 44.2  . Smokeless tobacco: Never Used  . Tobacco comment: started at age 19.   quit in the 64s.  Substance Use Topics  . Alcohol use: Yes    Alcohol/week: 0.0 standard drinks    Comment: 2 glasses of wine per year  . Drug use: No    Comment: CBD and hemp OIL      Family Hx: The patient's family history includes Breast cancer in her sister; CAD in her father; Colon cancer in her maternal grandfather and maternal uncle; Congestive Heart Failure in her father;  Dementia in her father; Diabetes in an other family member; Esophageal cancer in her brother; Hepatitis C in her brother; Lung cancer in her mother; Stroke in her father. There is no history of Rectal cancer, Stomach cancer, or Colon polyps.  ROS:   Please see the history of present  illness.     All other systems reviewed and are negative.  Prior CV studies:   The following studies were reviewed today:  Echocardiogram 04/02/2019:  Compared with the echo 02/6999, systolic function has improved. . Left ventricular ejection fraction, by estimation, is 55 to 60%. Left ventricular ejection fraction by PLAX is 65 %. The left ventricle has normal function. The left ventricle has no regional wall motion abnormalities. Left ventricular diastolic parameters are consistent with Grade I diastolic dysfunction (impaired relaxation). 2. Right ventricular systolic function is normal. The right ventricular size is normal. There is normal pulmonary artery systolic pressure. 3. The mitral valve is normal in structure and function. Trivial mitral valve regurgitation. No evidence of mitral stenosis. 4. The aortic valve is tricuspid. Aortic valve regurgitation is mild. No aortic stenosis is present. 5. The inferior vena cava is normal in size with greater than 50% respiratory variability, suggesting right atrial pressure of 3 mmHg.   Cardiac catheterization 05/06/2017: Conclusions: 1. No angiographically significant coronary artery disease. 2. Moderately to severely reduced left ventricular contraction (LVEF 30-35%), consistent with non-ischemic cardiomyopathy. 3. Upper normal to mildly elevated left heart, right heart, and pulmonary artery pressures. 4. Low normal to mildly decreased cardiac output/index.  Recommendations: 1. Optimize evidence-based heart failure therapy. 2. Primary prevention of coronary artery disease.  Echocardiogram 05/06/2017:  Study Conclusions  - Procedure narrative:  Transthoracic echocardiography. Technically difficult study with reduced echo windows. Intravenous contrast (Definity) was administered. - Left ventricle: The cavity size was normal. Wall thickness was increased in a pattern of moderate LVH. Systolic function was mildly to moderately reduced. The estimated ejection fraction was in the range of 40% to 45%. Inferoseptal hypokinesis. Doppler parameters are consistent with abnormal left ventricular relaxation (grade 1 diastolic dysfunction). The E/e&' ratio is between 8-15, suggesting indeterminate LV filling pressure. - Aortic valve: Sclerosis without stenosis. There was trivial regurgitation. - Mitral valve: Mildly thickened leaflets . There was trivial regurgitation. - Left atrium: The atrium was normal in size. - Inferior vena cava: The vessel was normal in size. The respirophasic diameter changes were in the normal range (>= 50%), consistent with normal central venous pressure.  Impressions:  - Technically difficult study. Definity contrast given. Compared to a prior echo in 2011, LVEF is lower at 40-45% with predominant inferior and inferoseptal hypokinesis.   Labs/Other Tests and Data Reviewed:    EKG:  No ECG reviewed.  Recent Labs: 03/15/2019: ALT 11; BUN 19; Creatinine, Ser 0.91; Hemoglobin 13.0; Platelets 223.0; Potassium 4.7; Sodium 139   Recent Lipid Panel Lab Results  Component Value Date/Time   CHOL 148 10/30/2015 11:02 AM   TRIG 68.0 10/30/2015 11:02 AM   HDL 60.50 10/30/2015 11:02 AM   CHOLHDL 2 10/30/2015 11:02 AM   LDLCALC 74 10/30/2015 11:02 AM   LDLDIRECT 166.0 04/14/2010 03:39 PM    Wt Readings from Last 3 Encounters:  04/13/19 234 lb (106.1 kg)  03/30/19 232 lb (105.2 kg)  02/13/19 234 lb 9.6 oz (106.4 kg)     Objective:    Vital Signs:  BP (!) 132/59   Pulse 60   Ht '5\' 6"'  (1.676 m)   Wt 234 lb (106.1 kg)   BMI 37.77 kg/m    VITAL SIGNS:  reviewed GEN:  no  acute distress NEURO:  alert and oriented x 3, no obvious focal deficit PSYCH:  normal affect  ASSESSMENT & PLAN:    1. Chronic combined systolic and diastolic HF: -On cardiac catheterization 04/2017 LVEF appeared  to be in the 30 to 35% range with follow-up echocardiogram at 40 to 45%>>no CAD -Entresto increased to maximum dose given issues with hypertension -Continue LasixPRN>>>reports no recent need for Lasix -Repeat echocardiogram performed 04/02/2019 with essentially normalized LV function at 55 to 60% -Does not have fluid volume overload symptoms  2.  Chest pain: -Patient reports intermittent chest with no associated symptoms. Underwent a cardiac cath as above 04/26/2017 with no CAD -BPs continue to be labile with fluctuations from SBP's in the 130 range up to 170 mmHg  -Continue to work on adequate BP control at this time.  We will add BiDil 20/37.5 and reassess in 1 month  -No up titration of Toprol given low normal HR  -Could possibly add amlodipine if BPs continue to be elevated  -If symptoms persist, will plan for possible stress testing versus coronary CTA however there is low suspicion for ACS at this time given reassuring LHC in 2019 with no suspicious lesions.    3. Essential hypertension -Stable today however reports very labile at home with SBP's ranging from 130 all the way to 170 mmHg -No up titration of Toprol given low normal HR -We will add BiDil 20/37.5 p.o. 3 times daily -Consider adding amlodipine at a later time -Reviewed BP log and BP self-assessment  4. Carotid artery disease: -1 to 39% bilaterally, mild in 2018 on carotid Dopplers -Continue with secondary prevention -No symptoms -Placed on ASA   COVID-19 Education: The signs and symptoms of COVID-19 were discussed with the patient and how to seek care for testing (follow up with PCP or arrange E-visit).  The importance of social distancing was discussed today.  Time:   Today, I have spent 15  minutes with the patient with telehealth technology discussing the above problems.     Medication Adjustments/Labs and Tests Ordered: Current medicines are reviewed at length with the patient today.  Concerns regarding medicines are outlined above.   Tests Ordered: No orders of the defined types were placed in this encounter.   Medication Changes: No orders of the defined types were placed in this encounter.   Follow Up:  Virtual Visit  Myself in 1 month  Signed, Kathyrn Drown, NP  04/13/2019 10:36 AM    [place]

## 2019-04-13 ENCOUNTER — Other Ambulatory Visit: Payer: Self-pay

## 2019-04-13 ENCOUNTER — Telehealth (INDEPENDENT_AMBULATORY_CARE_PROVIDER_SITE_OTHER): Payer: Medicare Other | Admitting: Cardiology

## 2019-04-13 ENCOUNTER — Encounter: Payer: Self-pay | Admitting: Cardiology

## 2019-04-13 VITALS — BP 132/59 | HR 60 | Ht 66.0 in | Wt 234.0 lb

## 2019-04-13 DIAGNOSIS — R079 Chest pain, unspecified: Secondary | ICD-10-CM | POA: Diagnosis not present

## 2019-04-13 DIAGNOSIS — I10 Essential (primary) hypertension: Secondary | ICD-10-CM

## 2019-04-13 DIAGNOSIS — I5042 Chronic combined systolic (congestive) and diastolic (congestive) heart failure: Secondary | ICD-10-CM | POA: Diagnosis not present

## 2019-04-13 DIAGNOSIS — I428 Other cardiomyopathies: Secondary | ICD-10-CM | POA: Diagnosis not present

## 2019-04-13 MED ORDER — BIDIL 20-37.5 MG PO TABS: 1.0000 | ORAL_TABLET | Freq: Three times a day (TID) | ORAL | 6 refills | Status: DC

## 2019-04-13 NOTE — Addendum Note (Signed)
Addended by: Mady Haagensen on: 04/13/2019 10:45 AM   Modules accepted: Orders

## 2019-04-13 NOTE — Patient Instructions (Signed)
Medication Instructions:   Your physician has recommended you make the following change in your medication:   1) Start Bidil 20-37.5 mg, 1 tablet by mouth three times a day  *If you need a refill on your cardiac medications before your next appointment, please call your pharmacy*  Lab Work:  None ordered today  If you have labs (blood work) drawn today and your tests are completely normal, you will receive your results only by: [name] MyChart Message (if you have MyChart) OR . A paper copy in the mail If you have any lab test that is abnormal or we need to change your treatment, we will call you to review the results.  Testing/Procedures:  None ordered today  Follow-Up: At [facility], you and your health needs are our priority.  As part of our continuing mission to provide you with exceptional heart care, we have created designated Provider Care Teams.  These Care Teams include your primary Cardiologist (physician) and Advanced Practice Providers (APPs -  Physician Assistants and Nurse Practitioners) who all work together to provide you with the care you need, when you need it.  We recommend signing up for the patient portal called "MyChart".  Sign up information is provided on this After Visit Summary.  MyChart is used to connect with patients for Virtual Visits (Telemedicine).  Patients are able to view lab/test results, encounter notes, upcoming appointments, etc.  Non-urgent messages can be sent to your provider as well.   To learn more about what you can do with MyChart, go to [email or web address].    Your next appointment:   1 month(s)  The format for your next appointment:   Virtual Visit   Provider:   Kathyrn Drown, NP

## 2019-05-06 ENCOUNTER — Other Ambulatory Visit: Payer: Self-pay | Admitting: Internal Medicine

## 2019-05-09 ENCOUNTER — Telehealth: Payer: Self-pay

## 2019-05-09 ENCOUNTER — Other Ambulatory Visit: Payer: Self-pay | Admitting: Internal Medicine

## 2019-05-09 NOTE — Telephone Encounter (Signed)
Rx's already refilled today.

## 2019-05-09 NOTE — Telephone Encounter (Signed)
Patient called in to see if Dr. Larose Kells could send in a prescription for rOPINIRole (REQUIP) 1 MG tablet [id number]   &    traZODone (DESYREL) 50 MG tablet [id number]   Please send it to [id number] - [place], [place]  [address], [place] [address]  Phone:  [phone] Fax:  [phone]  DEA #:  [id number]

## 2019-05-21 NOTE — Progress Notes (Signed)
Virtual Visit via Telephone Note   This visit type was conducted due to national recommendations for restrictions regarding the COVID-19 Pandemic (e.g. social distancing) in an effort to limit this patient's exposure and mitigate transmission in our community.  Due to her co-morbid illnesses, this patient is at least at moderate risk for complications without adequate follow up.  This format is felt to be most appropriate for this patient at this time.  The patient did not have access to video technology/had technical difficulties with video requiring transitioning to audio format only (telephone).  All issues noted in this document were discussed and addressed.  No physical exam could be performed with this format.  Please refer to the patient's chart for her  consent to telehealth for [facility].   The patient was identified using 2 identifiers.  Date:  05/21/2019   ID:  [name], DOB [birth year], MRN [id number]  Patient Location: Home Provider Location: Home  PCP:  Colon Branch, MD  Cardiologist:  Candee Furbish, MD  Electrophysiologist:  None   Evaluation Performed:  Follow-Up Visit  Chief Complaint:  Follow up   History of Present Illness:    [name] is a 74 y.o. female with ahistory of chronic combined systolic and diastolic heart failure, hypertension, carotid artery disease, Barrett's esophagus, GERD, depression/anxiety/fibromyalgia.  LVEF of 30 to 45% per echocardiogram with no CAD on cardiac catheterization 05/06/2017 with upper normal right and left sided filling pressures.   She has a hx of dizziness with initial up titration of Entresto however this improved after decreasing Entresto back down to moderate dose of 49/51.  Lasix was prescribed on an as-needed basis.   Fortunately repeat echocardiogram from 04/02/2019 showed improvement in her LV function at 55 to 60% with G1 DD and normal valvular function.  She has had some previous inconsistencies with her  medications, Toprol in particular. She was seen by myself 04/13/19 in follow up and her BP continued to be labile BPS with intermittent CP. Cath from 04/26/2017 with no CAD. We discussed uncontrolled BP and her plan was to consistently take antihypertensive medications along with good BP log with close follow-up.  If symptoms persisted plan was for possible coronary CTA however as above, last LHC in 2019 with no CAD present.  Reassuring.  Today Ms. [name] reports that she is doing better from a CV standpoint.  BPs in March continue to be labile however have somewhat stabilized.  She continues to be inconsistent with checking her BPs on a regular basis however has been better with taking her medications.  She is recently started a walking program with a good friend for which they walk 5 days/week.  She has noticed some dyspnea however no chest pain felt to be consistent with deconditioning.  She is to contact us if this worsens.  Reassured by most recent echocardiogram with normalized LV function.  She denies chest pain, shortness of breath with rest (some with exercise), palpitations, LE edema, orthopnea, dizziness or syncope.  Reports that her son continues to cook for her.  Denies frequent eating out.  Appears to be doing better.   The patient does not have symptoms concerning for COVID-19 infection (fever, chills, cough, or new shortness of breath).    Past Medical History:  Diagnosis Date  . Allergy   . Anemia   . Anxiety   . Barrett's esophagus   . Bilateral carpal tunnel syndrome    Dr. Eddie Dibbles, having injection therapy  .  C. difficile diarrhea 08/01/2012   severe 2014  . Carotid arterial disease ([place])   . Cataract    removed both eyes   . Chronic combined systolic and diastolic CHF (congestive heart failure) ([place])   . Chronic cystitis    Dr. Matilde Sprang  . Chronic lower back pain   . Chronic pain syndrome   . Depression   . Dizziness   . DJD (degenerative joint disease)    bilateral  hands; knees  . Family history of anesthesia complication    Mother had severe N/V  . Fibromyalgia   . GERD (gastroesophageal reflux disease)   . H/O cardiac catheterization    (-) cath 12-2002  , cath again 2011 (-)  . History of colon polyps   . HTN (hypertension)   . Hyperlipidemia    past hx   . IBS (irritable bowel syndrome)   . Insomnia   . Migraine   . Morbid obesity ([place])   . Non-ischemic cardiomyopathy ([place])   . Osteoporosis    pt unsure of this  . RLS (restless legs syndrome)   . Vertigo   . Vitamin B 12 deficiency 04/09/2013   Past Surgical History:  Procedure Laterality Date  . Arm surgery Left    "don't remember what they did; arm wasn't broken"  . BILATERAL KNEE ARTHROSCOPY Bilateral   . BIOPSY  08/07/2018   Procedure: BIOPSY;  Surgeon: Ladene Artist, MD;  [facility]: [facility];  Service: Endoscopy;;  . CARDIAC CATHETERIZATION  01/22/10   clean cath  . CATARACT EXTRACTION W/ INTRAOCULAR LENS  IMPLANT, BILATERAL Bilateral   . CHOLECYSTECTOMY N/A 10/25/2016   Procedure: LAPAROSCOPIC CHOLECYSTECTOMY;  Surgeon: Georganna Skeans, MD;  Location: [place];  Service: General;  Laterality: N/A;  . COLONOSCOPY    . COLONOSCOPY W/ POLYPECTOMY    . ESOPHAGOGASTRODUODENOSCOPY (EGD) WITH PROPOFOL N/A 08/07/2018   Procedure: ESOPHAGOGASTRODUODENOSCOPY (EGD) WITH PROPOFOL;  Surgeon: Ladene Artist, MD;  [facility];  Service: Endoscopy;  Laterality: N/A;  . FOOT SURGERY Bilateral    toenails removed; callus removed on right; hammertoes right"  . Knot     "removed from right neck; not a goiter"  . LUMBAR DISC SURGERY     L5 S1 anterior fusion  . OOPHORECTOMY    . RIGHT/LEFT HEART CATH AND CORONARY ANGIOGRAPHY N/A 05/06/2017   Procedure: RIGHT/LEFT HEART CATH AND CORONARY ANGIOGRAPHY;  Surgeon: Nelva Bush, MD;  Location: [place] [facility];  Service: Cardiovascular;  Laterality: N/A;  . SHOULDER ARTHROSCOPY Right   . TOTAL KNEE ARTHROPLASTY  10/05/2011    Procedure: TOTAL KNEE ARTHROPLASTY;  Surgeon: Hessie Dibble, MD;  Location: [place];  Service: Orthopedics;  Laterality: Right;  . UPPER GASTROINTESTINAL ENDOSCOPY    . VAGINAL HYSTERECTOMY     for endometriosis     No outpatient medications have been marked as taking for the 05/28/19 encounter (Appointment) with Tommie Raymond, NP.     Allergies:   Dextromethorphan-guaifenesin, Morphine, and Penicillins   Social History   Tobacco Use  . Smoking status: Former Smoker    Packs/day: 0.50    Years: 10.00    Pack years: 5.00    Quit date: 02/09/1975    Years since quitting: 44.3  . Smokeless tobacco: Never Used  . Tobacco comment: started at age 17.   quit in the 42s.  Substance Use Topics  . Alcohol use: Yes    Alcohol/week: 0.0 standard drinks    Comment: 2 glasses of wine  per year  . Drug use: No    Comment: CBD and hemp OIL      Family Hx: The patient's family history includes Breast cancer in her sister; CAD in her father; Colon cancer in her maternal grandfather and maternal uncle; Congestive Heart Failure in her father; Dementia in her father; Diabetes in an other family member; Esophageal cancer in her brother; Hepatitis C in her brother; Lung cancer in her mother; Stroke in her father. There is no history of Rectal cancer, Stomach cancer, or Colon polyps.  ROS:   Please see the history of present illness.     All other systems reviewed and are negative.   Prior CV studies:   The following studies were reviewed today:  Echocardiogram 04/02/2019:  Compared with the echo [id number], systolic function has improved. . Left ventricular ejection fraction, by estimation, is 55 to 60%. Left ventricular ejection fraction by PLAX is 65 %. The left ventricle has normal function. The left ventricle has no regional wall motion abnormalities. Left ventricular diastolic parameters are consistent with Grade I diastolic dysfunction (impaired relaxation). 2. Right ventricular  systolic function is normal. The right ventricular size is normal. There is normal pulmonary artery systolic pressure. 3. The mitral valve is normal in structure and function. Trivial mitral valve regurgitation. No evidence of mitral stenosis. 4. The aortic valve is tricuspid. Aortic valve regurgitation is mild. No aortic stenosis is present. 5. The inferior vena cava is normal in size with greater than 50% respiratory variability, suggesting right atrial pressure of 3 mmHg.   Cardiac catheterization 05/06/2017: Conclusions: 1. No angiographically significant coronary artery disease. 2. Moderately to severely reduced left ventricular contraction (LVEF 30-35%), consistent with non-ischemic cardiomyopathy. 3. Upper normal to mildly elevated left heart, right heart, and pulmonary artery pressures. 4. Low normal to mildly decreased cardiac output/index.  Recommendations: 1. Optimize evidence-based heart failure therapy. 2. Primary prevention of coronary artery disease.  Echocardiogram 05/06/2017:  Study Conclusions  - Procedure narrative: Transthoracic echocardiography. Technically difficult study with reduced echo windows. Intravenous contrast (Definity) was administered. - Left ventricle: The cavity size was normal. Wall thickness was increased in a pattern of moderate LVH. Systolic function was mildly to moderately reduced. The estimated ejection fraction was in the range of 40% to 45%. Inferoseptal hypokinesis. Doppler parameters are consistent with abnormal left ventricular relaxation (grade 1 diastolic dysfunction). The E/e&' ratio is between 8-15, suggesting indeterminate LV filling pressure. - Aortic valve: Sclerosis without stenosis. There was trivial regurgitation. - Mitral valve: Mildly thickened leaflets . There was trivial regurgitation. - Left atrium: The atrium was normal in size. - Inferior vena cava: The vessel was normal in size.  The respirophasic diameter changes were in the normal range (>= 50%), consistent with normal central venous pressure.  Impressions:  - Technically difficult study. Definity contrast given. Compared to a prior echo in 2011, LVEF is lower at 40-45% with predominant inferior and inferoseptal hypokinesis.   Labs/Other Tests and Data Reviewed:    EKG:  No ECG reviewed.  Recent Labs: 03/15/2019: ALT 11; BUN 19; Creatinine, Ser 0.91; Hemoglobin 13.0; Platelets 223.0; Potassium 4.7; Sodium 139   Recent Lipid Panel Lab Results  Component Value Date/Time   CHOL 148 10/30/2015 11:02 AM   TRIG 68.0 10/30/2015 11:02 AM   HDL 60.50 10/30/2015 11:02 AM   CHOLHDL 2 10/30/2015 11:02 AM   LDLCALC 74 10/30/2015 11:02 AM   LDLDIRECT 166.0 04/14/2010 03:39 PM    Wt Readings from Last 3 Encounters:  04/13/19 234 lb (106.1 kg)  03/30/19 232 lb (105.2 kg)  02/13/19 234 lb 9.6 oz (106.4 kg)     Objective:    Vital Signs:  There were no vitals taken for this visit.   VITAL SIGNS:  reviewed GEN:  no acute distress NEURO:  alert and oriented x 3, no obvious focal deficit PSYCH:  normal affect  ASSESSMENT & PLAN:    1. Chronic combined systolic and diastolic HF: -On cardiac catheterization 04/2017 LVEF appeared to be in the 30 to 35% range with follow-up echocardiogram at 40 to 45%>>no CAD -Entresto increased to maximum dose given issues with hypertension -Continue LasixPRN>>>reports no recent need for Lasix -Repeat echocardiogram performed 04/02/2019 with essentially normalized LV function at 55 to 60% -Does not have fluid volume overload symptoms  2. Chest pain: -Denies recurrence  -Underwent a cardiac cath as above 04/26/2017 with no CAD -BP continues to be somewhat labile however more stable this month after starting an exercise program  -Could possibly add amlodipine if BPs continue to be elevated   3.Essential hypertension -Stable today, 127/66 -BiDil 20/37.5 added  to regimen last OV  -Consider adding amlodipine at a later time if continues to be labile despite consistently monitoring BPs and taking antihypertensive medications -Long discussion about the above and how this helps [place] determine best treatment plan -Reviewed BP log and BP self-assessment  4. Carotid artery disease: -1 to 39% bilaterally, mild in 2018 on carotid Dopplers -Continue with secondary prevention -No symptoms -Placed on ASA   COVID-19 Education: The signs and symptoms of COVID-19 were discussed with the patient and how to seek care for testing (follow up with PCP or arrange E-visit). The importance of social distancing was discussed today.  Time:   Today, I have spent 15 minutes with the patient with telehealth technology discussing the above problems.     Medication Adjustments/Labs and Tests Ordered: Current medicines are reviewed at length with the patient today.  Concerns regarding medicines are outlined above.   Tests Ordered: No orders of the defined types were placed in this encounter.   Medication Changes: No orders of the defined types were placed in this encounter.   Follow Up:  Virtual Visit  3-4 months with Dr. Marlou Porch or myself  Signed, Kathyrn Drown, NP  05/21/2019 3:27 PM    [place]

## 2019-05-28 ENCOUNTER — Telehealth (INDEPENDENT_AMBULATORY_CARE_PROVIDER_SITE_OTHER): Payer: Medicare Other | Admitting: Cardiology

## 2019-05-28 ENCOUNTER — Encounter: Payer: Self-pay | Admitting: Cardiology

## 2019-05-28 ENCOUNTER — Other Ambulatory Visit: Payer: Self-pay

## 2019-05-28 VITALS — BP 127/66 | HR 61 | Ht 66.0 in | Wt 233.0 lb

## 2019-05-28 DIAGNOSIS — I110 Hypertensive heart disease with heart failure: Secondary | ICD-10-CM | POA: Diagnosis not present

## 2019-05-28 DIAGNOSIS — E782 Mixed hyperlipidemia: Secondary | ICD-10-CM | POA: Diagnosis not present

## 2019-05-28 DIAGNOSIS — I428 Other cardiomyopathies: Secondary | ICD-10-CM

## 2019-05-28 DIAGNOSIS — I10 Essential (primary) hypertension: Secondary | ICD-10-CM

## 2019-05-28 DIAGNOSIS — I5042 Chronic combined systolic (congestive) and diastolic (congestive) heart failure: Secondary | ICD-10-CM

## 2019-05-28 NOTE — Patient Instructions (Addendum)
Medication Instructions:   Your physician recommends that you continue on your current medications as directed. Please refer to the Current Medication list given to you today.  *If you need a refill on your cardiac medications before your next appointment, please call your pharmacy*  Lab Work:  None ordered today  Testing/Procedures:  None ordered today  Follow-Up: At [facility], you and your health needs are our priority.  As part of our continuing mission to provide you with exceptional heart care, we have created designated Provider Care Teams.  These Care Teams include your primary Cardiologist (physician) and Advanced Practice Providers (APPs -  Physician Assistants and Nurse Practitioners) who all work together to provide you with the care you need, when you need it.  We recommend signing up for the patient portal called "MyChart".  Sign up information is provided on this After Visit Summary.  MyChart is used to connect with patients for Virtual Visits (Telemedicine).  Patients are able to view lab/test results, encounter notes, upcoming appointments, etc.  Non-urgent messages can be sent to your provider as well.   To learn more about what you can do with MyChart, go to [email or web address].    Your next appointment:    On 09/03/19 at 10:20AM with Candee Furbish, MD

## 2019-07-04 ENCOUNTER — Encounter: Payer: Self-pay | Admitting: Internal Medicine

## 2019-07-04 ENCOUNTER — Other Ambulatory Visit: Payer: Self-pay

## 2019-07-04 ENCOUNTER — Ambulatory Visit (INDEPENDENT_AMBULATORY_CARE_PROVIDER_SITE_OTHER): Payer: Medicare Other | Admitting: Internal Medicine

## 2019-07-04 VITALS — BP 124/72 | HR 40 | Temp 95.3°F | Resp 18 | Ht 66.0 in | Wt 228.0 lb

## 2019-07-04 DIAGNOSIS — F329 Major depressive disorder, single episode, unspecified: Secondary | ICD-10-CM | POA: Diagnosis not present

## 2019-07-04 DIAGNOSIS — I5042 Chronic combined systolic (congestive) and diastolic (congestive) heart failure: Secondary | ICD-10-CM

## 2019-07-04 DIAGNOSIS — I10 Essential (primary) hypertension: Secondary | ICD-10-CM | POA: Diagnosis not present

## 2019-07-04 DIAGNOSIS — F32A Depression, unspecified: Secondary | ICD-10-CM

## 2019-07-04 DIAGNOSIS — F419 Anxiety disorder, unspecified: Secondary | ICD-10-CM | POA: Diagnosis not present

## 2019-07-04 NOTE — Progress Notes (Signed)
Subjective:    Patient ID: [name], female    DOB: [birth year], 74 y.o.   MRN: [id number]  DOS:  07/04/2019 Type of visit - description: Routine visit In general doing well. For a while she was taking walks up to 1 or more miles, no exertional chest pain. She would  get DOE when walking but she would sit down for few minutes and then kept going. She stopped doing that about a month ago due to aches and pains as well as the warmer weather.  Review of Systems Denies lower extremity edema Emotionally doing well   Past Medical History:  Diagnosis Date  . Allergy   . Anemia   . Anxiety   . Barrett's esophagus   . Bilateral carpal tunnel syndrome    Dr. Eddie Dibbles, having injection therapy  . C. difficile diarrhea 08/01/2012   severe 2014  . Carotid arterial disease ([place])   . Cataract    removed both eyes   . Chronic combined systolic and diastolic CHF (congestive heart failure) ([place])   . Chronic cystitis    Dr. Matilde Sprang  . Chronic lower back pain   . Chronic pain syndrome   . Depression   . Dizziness   . DJD (degenerative joint disease)    bilateral hands; knees  . Family history of anesthesia complication    Mother had severe N/V  . Fibromyalgia   . GERD (gastroesophageal reflux disease)   . H/O cardiac catheterization    (-) cath 12-2002  , cath again 2011 (-)  . History of colon polyps   . HTN (hypertension)   . Hyperlipidemia    past hx   . IBS (irritable bowel syndrome)   . Insomnia   . Migraine   . Morbid obesity ([place])   . Non-ischemic cardiomyopathy ([place])   . Osteoporosis    pt unsure of this  . RLS (restless legs syndrome)   . Vertigo   . Vitamin B 12 deficiency 04/09/2013    Past Surgical History:  Procedure Laterality Date  . Arm surgery Left    "don't remember what they did; arm wasn't broken"  . BILATERAL KNEE ARTHROSCOPY Bilateral   . BIOPSY  08/07/2018   Procedure: BIOPSY;  Surgeon: Ladene Artist, MD;  [facility];  Service:  Endoscopy;;  . CARDIAC CATHETERIZATION  01/22/10   clean cath  . CATARACT EXTRACTION W/ INTRAOCULAR LENS  IMPLANT, BILATERAL Bilateral   . CHOLECYSTECTOMY N/A 10/25/2016   Procedure: LAPAROSCOPIC CHOLECYSTECTOMY;  Surgeon: Georganna Skeans, MD;  Location: [place];  Service: General;  Laterality: N/A;  . COLONOSCOPY    . COLONOSCOPY W/ POLYPECTOMY    . ESOPHAGOGASTRODUODENOSCOPY (EGD) WITH PROPOFOL N/A 08/07/2018   Procedure: ESOPHAGOGASTRODUODENOSCOPY (EGD) WITH PROPOFOL;  Surgeon: Ladene Artist, MD;  [facility];  Service: Endoscopy;  Laterality: N/A;  . FOOT SURGERY Bilateral    toenails removed; callus removed on right; hammertoes right"  . Knot     "removed from right neck; not a goiter"  . LUMBAR DISC SURGERY     L5 S1 anterior fusion  . OOPHORECTOMY    . RIGHT/LEFT HEART CATH AND CORONARY ANGIOGRAPHY N/A 05/06/2017   Procedure: RIGHT/LEFT HEART CATH AND CORONARY ANGIOGRAPHY;  Surgeon: Nelva Bush, MD;  Location: [place] [facility];  Service: Cardiovascular;  Laterality: N/A;  . SHOULDER ARTHROSCOPY Right   . TOTAL KNEE ARTHROPLASTY  10/05/2011   Procedure: TOTAL KNEE ARTHROPLASTY;  Surgeon: Hessie Dibble, MD;  [facility];  Service: Orthopedics;  Laterality: Right;  . UPPER GASTROINTESTINAL ENDOSCOPY    . VAGINAL HYSTERECTOMY     for endometriosis    Allergies as of 07/04/2019      Reactions   Dextromethorphan-guaifenesin Nausea And Vomiting   Morphine Nausea And Vomiting   Penicillins Rash   "> 30 years ago; best I can remember it was just a light rash on my arm" Did it involve swelling of the face/tongue/throat, SOB, or low BP? No Did it involve sudden or severe rash/hives, skin peeling, or any reaction on the inside of your mouth or nose? Unknown Did you need to seek medical attention at a hospital or doctor's office? No When did it last happen?more than 30 years  If all above answers are "NO", may proceed with cephalosporin use.        Medication List       Accurate as of Jul 04, 2019 11:59 PM. If you have any questions, ask your nurse or doctor.        aspirin EC 81 MG tablet Take 81 mg by mouth daily.   cholecalciferol 1000 units tablet Commonly known as: VITAMIN D Take 1,000 Units by mouth daily.   dicyclomine 20 MG tablet Commonly known as: BENTYL Take 1 tablet (20 mg total) by mouth 4 (four) times daily -  before meals and at bedtime.   Entresto 97-103 MG Generic drug: sacubitril-valsartan Take 1 tablet by mouth 2 (two) times daily.   furosemide 20 MG tablet Commonly known as: LASIX Take 20 mg by mouth as needed for fluid or edema.   HYDROcodone-acetaminophen 10-325 MG tablet Commonly known as: NORCO Take 1-2 tablets by mouth daily.   loratadine 10 MG tablet Commonly known as: CLARITIN Take 10 mg by mouth daily as needed for allergies.   meclizine 12.5 MG tablet Commonly known as: ANTIVERT Take 1-2 tablets (12.5-25 mg total) by mouth 3 (three) times daily as needed for dizziness.   metoprolol succinate 50 MG 24 hr tablet Commonly known as: TOPROL-XL TAKE 1 AND 1/2 TABLET BY MOUTH EVERY DAY   Narcan 4 MG/0.1ML Liqd nasal spray kit Generic drug: naloxone Place 1 spray into the nose once as needed (opioid od).   nitroGLYCERIN 0.4 MG SL tablet Commonly known as: NITROSTAT Place 1 tablet (0.4 mg total) under the tongue every 5 (five) minutes as needed for chest pain.   omeprazole 40 MG capsule Commonly known as: PRILOSEC Take 1 capsule (40 mg total) by mouth 2 (two) times daily.   rOPINIRole 1 MG tablet Commonly known as: REQUIP TAKE 1 TABLET BY MOUTH TWICE A DAY   sertraline 50 MG tablet Commonly known as: ZOLOFT TAKE 1 AND 1/2 TABLETS BY MOUTH DAILY   SYSTANE BALANCE OP Place 1 drop into both eyes 2 (two) times daily as needed (DRY EYES).   traZODone 50 MG tablet Commonly known as: DESYREL TAKE 1-2 TABLETS (50-100 MG TOTAL) BY MOUTH AT BEDTIME.          Objective:    Physical Exam BP 124/72 (BP Location: Left Arm, Patient Position: Sitting, Cuff Size: Normal)   Pulse (!) 40   Temp (!) 95.3 F (35.2 C) (Temporal)   Resp 18   Ht '5\' 6"'  (1.676 m)   Wt 228 lb (103.4 kg)   SpO2 99%   BMI 36.80 kg/m  General:   Well developed, NAD, BMI noted. HEENT:  Normocephalic . Face symmetric, atraumatic Neck: No JVD at 45 degrees Lungs:  CTA  B Normal respiratory effort, no intercostal retractions, no accessory muscle use. Heart: RRR,  no murmur.  Lower extremities: no pretibial edema bilaterally  Skin: Not pale. Not jaundice Neurologic:  alert & oriented X3.  Speech normal, gait appropriate for age and unassisted Psych--  Cognition and judgment appear intact.  Cooperative with normal attention span and concentration.  Behavior appropriate. No anxious or depressed appearing.      Assessment     Assessment HTN Hyperlipidemia-  Pravachol, Crestor: Myalgias. Intolerant to Zetia (joint aches), see OV  06/2016  Depression, Anxiety (on clonazepam), insomnia (on trazodone):  depression x many  years, remotely on zoloft and other meds per psych, I rx lexapro 2015, then was rx effexor x a while Morbid obesity CV: CHF/ non ischemic cardiomyopathy dx 04-2017; cath normal coronaries, EF ~ 30% Carotid artery disease: 1 to 39% bilateral carotids 09-2016, start aspirin per neurology GI:  --GERD, Barrett's esophagus, had a EGD 07/2018  --h/o persistent C. difficile diarrhea 2014 B12 deficiency Osteopenia: Dexa 2015 showed osteopenia, T score -1.9 (10/2016): Rx cs, vit D PULM: --NPSG 2008:  No osa, PLMS 4/hr with arousal and awakening --RLS - Requip  Migraines, f/u [facility] -- off topamax as off 06-2016 MSK: --Back pain, chronic: pain meds rx by ortho, Workers comp MD @ [employer] pain mngmt [place] --DJD,CTS B ([facility]) --Fibromyalgia.  See note from 02/14/2018 OAB, LUTS-- on myrbetriq Dr [place]  Raynaud phenomena---- DX 07/2017  PLAN HTN: BP today is  very good, at home states that her BP "varies".  Recommend no change for now,Continue metoprolol, Entresto, Lasix.  Last BMP satisfactory. Depression, anxiety, insomnia: Seems controlled on Zoloft, trazodone, RF as needed CHF: Good med compliance, does not seem to be volume overloaded.  BP satisfactory. DOE, this is a ongoing issue, likely multifactorial.  She had cardiac catheterization 04/2017 with normal coronaries.  No change for now, encouraged to start regular walking is again gradually. Scalp pain, see last visit, labs negative  Preventive care: Reluctant to proceed with Covid shots, pro >> cons discussed. RTC 4 months   This visit occurred during the SARS-CoV-2 public health emergency.  Safety protocols were in place, including screening questions prior to the visit, additional usage of staff PPE, and extensive cleaning of exam room while observing appropriate contact time as indicated for disinfecting solutions.

## 2019-07-04 NOTE — Progress Notes (Signed)
Pre visit review using our clinic review tool, if applicable. No additional management support is needed unless otherwise documented below in the visit note. 

## 2019-07-04 NOTE — Patient Instructions (Addendum)
COVID-19 Vaccine Information can be found at: [email or web address].[email or web address] For questions related to vaccine distribution or appointments, please email [email or web address] or call [phone].   Continue checking your blood pressures BP GOAL is between 110/65 and  135/85. If it is consistently higher or lower, let me know    GO TO THE FRONT DESK, [place] back for a checkup in 4 months

## 2019-07-05 NOTE — Assessment & Plan Note (Signed)
HTN: BP today is very good, at home states that her BP "varies".  Recommend no change for now,Continue metoprolol, Entresto, Lasix.  Last BMP satisfactory. Depression, anxiety, insomnia: Seems controlled on Zoloft, trazodone, RF as needed CHF: Good med compliance, does not seem to be volume overloaded.  BP satisfactory. DOE, this is a ongoing issue, likely multifactorial.  She had cardiac catheterization 04/2017 with normal coronaries.  No change for now, encouraged to start regular walking is again gradually. Scalp pain, see last visit, labs negative  Preventive care: Reluctant to proceed with Covid shots, pro >> cons discussed. RTC 4 months

## 2019-08-17 DIAGNOSIS — H0100A Unspecified blepharitis right eye, upper and lower eyelids: Secondary | ICD-10-CM | POA: Diagnosis not present

## 2019-08-17 DIAGNOSIS — H40023 Open angle with borderline findings, high risk, bilateral: Secondary | ICD-10-CM | POA: Diagnosis not present

## 2019-08-17 DIAGNOSIS — H52223 Regular astigmatism, bilateral: Secondary | ICD-10-CM | POA: Diagnosis not present

## 2019-08-17 DIAGNOSIS — H0100B Unspecified blepharitis left eye, upper and lower eyelids: Secondary | ICD-10-CM | POA: Diagnosis not present

## 2019-08-17 DIAGNOSIS — H524 Presbyopia: Secondary | ICD-10-CM | POA: Diagnosis not present

## 2019-08-17 DIAGNOSIS — H5213 Myopia, bilateral: Secondary | ICD-10-CM | POA: Diagnosis not present

## 2019-08-17 DIAGNOSIS — Z961 Presence of intraocular lens: Secondary | ICD-10-CM | POA: Diagnosis not present

## 2019-08-17 DIAGNOSIS — H353130 Nonexudative age-related macular degeneration, bilateral, stage unspecified: Secondary | ICD-10-CM | POA: Diagnosis not present

## 2019-09-03 ENCOUNTER — Ambulatory Visit: Payer: Medicare Other | Admitting: Cardiology

## 2019-09-05 ENCOUNTER — Ambulatory Visit (INDEPENDENT_AMBULATORY_CARE_PROVIDER_SITE_OTHER): Payer: Medicare Other | Admitting: Cardiology

## 2019-09-05 ENCOUNTER — Other Ambulatory Visit: Payer: Self-pay

## 2019-09-05 ENCOUNTER — Encounter: Payer: Self-pay | Admitting: Cardiology

## 2019-09-05 VITALS — BP 130/80 | HR 59 | Ht 66.0 in | Wt 241.0 lb

## 2019-09-05 DIAGNOSIS — I5042 Chronic combined systolic (congestive) and diastolic (congestive) heart failure: Secondary | ICD-10-CM | POA: Diagnosis not present

## 2019-09-05 DIAGNOSIS — I428 Other cardiomyopathies: Secondary | ICD-10-CM

## 2019-09-05 DIAGNOSIS — I10 Essential (primary) hypertension: Secondary | ICD-10-CM | POA: Diagnosis not present

## 2019-09-05 NOTE — Progress Notes (Signed)
Cardiology Office Note:    Date:  09/05/2019   ID:  [name], DOB 09-24-1945, MRN [id number]  PCP:  Colon Branch, MD  Cardiologist:  Candee Furbish, MD   Referring MD: Colon Branch, MD     History of Present Illness:    [name] is a 74 y.o. female with systolic/diastolic heart failure EF as low as 30 to 35%, nonischemic with cardiac catheterization on 05/06/2017 showing no CAD here for follow-up.  [name] was started, Lasix was decreased.  Nutritionist referral was made but insurance would not pay for weight loss center.  Occasional edema especially pedal.  08/25/2017- dizziness is now occurring.  Blood pressure is lower.  We are stopping her Lasix 20.  [name] has been titrated.  No chest pain.  Occasionally she has been feeling migraines as well.  No bleeding.  No syncope.  She is still having some orthopnea.  09/05/19--here for follow up CHF. Trouble breathing at home. Back issues. Cane in the house. Walker when out, but does not utilize them.  Thankfully, her echocardiogram February 2021 demonstrates normal ejection fraction.  This is wonderful.  No longer feeling any dizziness.  Taking her Entresto.  She is now only taking Lasix as needed.  Past Medical History:  Diagnosis Date  . Allergy   . Anemia   . Anxiety   . Barrett's esophagus   . Bilateral carpal tunnel syndrome    Dr. Eddie Dibbles, having injection therapy  . C. difficile diarrhea 08/01/2012   severe 2014  . Carotid arterial disease ([place])   . Cataract    removed both eyes   . Chronic combined systolic and diastolic CHF (congestive heart failure) ([place])   . Chronic cystitis    Dr. Matilde Sprang  . Chronic lower back pain   . Chronic pain syndrome   . Depression   . Dizziness   . DJD (degenerative joint disease)    bilateral hands; knees  . Family history of anesthesia complication    Mother had severe N/V  . Fibromyalgia   . GERD (gastroesophageal reflux disease)   . H/O cardiac catheterization    (-) cath  12-2002  , cath again 2011 (-)  . History of colon polyps   . HTN (hypertension)   . Hyperlipidemia    past hx   . IBS (irritable bowel syndrome)   . Insomnia   . Migraine   . Morbid obesity ([place])   . Non-ischemic cardiomyopathy ([place])   . Osteoporosis    pt unsure of this  . RLS (restless legs syndrome)   . Vertigo   . Vitamin B 12 deficiency 04/09/2013    Past Surgical History:  Procedure Laterality Date  . Arm surgery Left    "don't remember what they did; arm wasn't broken"  . BILATERAL KNEE ARTHROSCOPY Bilateral   . BIOPSY  08/07/2018   Procedure: BIOPSY;  Surgeon: Ladene Artist, MD;  Location: [facility];  Service: Endoscopy;;  . CARDIAC CATHETERIZATION  01/22/10   clean cath  . CATARACT EXTRACTION W/ INTRAOCULAR LENS  IMPLANT, BILATERAL Bilateral   . CHOLECYSTECTOMY N/A 10/25/2016   Procedure: LAPAROSCOPIC CHOLECYSTECTOMY;  Surgeon: Georganna Skeans, MD;  Location: [place];  Service: General;  Laterality: N/A;  . COLONOSCOPY    . COLONOSCOPY W/ POLYPECTOMY    . ESOPHAGOGASTRODUODENOSCOPY (EGD) WITH PROPOFOL N/A 08/07/2018   Procedure: ESOPHAGOGASTRODUODENOSCOPY (EGD) WITH PROPOFOL;  Surgeon: Ladene Artist, MD;  Location: [facility];  Service: Endoscopy;  Laterality: N/A;  .  FOOT SURGERY Bilateral    toenails removed; callus removed on right; hammertoes right"  . Knot     "removed from right neck; not a goiter"  . LUMBAR DISC SURGERY     L5 S1 anterior fusion  . OOPHORECTOMY    . RIGHT/LEFT HEART CATH AND CORONARY ANGIOGRAPHY N/A 05/06/2017   Procedure: RIGHT/LEFT HEART CATH AND CORONARY ANGIOGRAPHY;  Surgeon: Nelva Bush, MD;  Location: [facility];  Service: Cardiovascular;  Laterality: N/A;  . SHOULDER ARTHROSCOPY Right   . TOTAL KNEE ARTHROPLASTY  10/05/2011   Procedure: TOTAL KNEE ARTHROPLASTY;  Surgeon: Hessie Dibble, MD;  Location: [facility];  Service: Orthopedics;  Laterality: Right;  . UPPER GASTROINTESTINAL ENDOSCOPY    . VAGINAL HYSTERECTOMY       for endometriosis    Current Medications: Current Meds  Medication Sig  . aspirin EC 81 MG tablet Take 81 mg by mouth daily.  . cholecalciferol (VITAMIN D) 1000 units tablet Take 1,000 Units by mouth daily.  [name] dicyclomine (BENTYL) 20 MG tablet Take 1 tablet (20 mg total) by mouth 4 (four) times daily -  before meals and at bedtime.  . furosemide (LASIX) 20 MG tablet Take 20 mg by mouth as needed for fluid or edema.  . gabapentin (NEURONTIN) 300 MG capsule Take 300 mg by mouth 3 (three) times daily with meals.  [name] HYDROcodone-acetaminophen (NORCO) 10-325 MG tablet Take 1-2 tablets by mouth daily.  [name] loratadine (CLARITIN) 10 MG tablet Take 10 mg by mouth daily as needed for allergies.  [name] meclizine (ANTIVERT) 12.5 MG tablet Take 1-2 tablets (12.5-25 mg total) by mouth 3 (three) times daily as needed for dizziness.  . metoprolol succinate (TOPROL-XL) 50 MG 24 hr tablet TAKE 1 AND 1/2 TABLET BY MOUTH EVERY DAY  . NARCAN 4 MG/0.1ML LIQD nasal spray kit Place 1 spray into the nose once as needed (opioid od).   . nitroGLYCERIN (NITROSTAT) 0.4 MG SL tablet Place 1 tablet (0.4 mg total) under the tongue every 5 (five) minutes as needed for chest pain.  [name] omeprazole (PRILOSEC) 40 MG capsule Take 1 capsule (40 mg total) by mouth 2 (two) times daily.  . pregabalin (LYRICA) 50 MG capsule Take 50 mg by mouth 3 (three) times daily.  [name] Propylene Glycol (SYSTANE BALANCE OP) Place 1 drop into both eyes 2 (two) times daily as needed (DRY EYES).   [name] rOPINIRole (REQUIP) 1 MG tablet TAKE 1 TABLET BY MOUTH TWICE A DAY  . sacubitril-valsartan (ENTRESTO) 97-103 MG Take 1 tablet by mouth 2 (two) times daily.  . sertraline (ZOLOFT) 50 MG tablet TAKE 1 AND 1/2 TABLETS BY MOUTH DAILY  . traZODone (DESYREL) 50 MG tablet TAKE 1-2 TABLETS (50-100 MG TOTAL) BY MOUTH AT BEDTIME.     Allergies:   Dextromethorphan-guaifenesin, Morphine, and Penicillins   Social History   Socioeconomic History  . Marital status: Divorced     Spouse name: Not on file  . Number of children: 2  . Years of education: 66  . Highest education level: High school graduate  Occupational History  . Occupation: Retired, post Ecologist: RETIRED  Tobacco Use  . Smoking status: Former Smoker    Packs/day: 0.50    Years: 10.00    Pack years: 5.00    Quit date: 02/09/1975    Years since quitting: 44.6  . Smokeless tobacco: Never Used  . Tobacco comment: started at age 54.   quit in the 72s.  Vaping Use  . Vaping Use: Never used  Substance and Sexual Activity  . Alcohol use: Yes    Alcohol/week: 0.0 standard drinks    Comment: 2 glasses of wine per year  . Drug use: No    Comment: CBD and hemp OIL   . Sexual activity: Not Currently    Partners: Male  Other Topics Concern  . Not on file  Social History Narrative   Son lives with her.   Right-handed.   2 soft drinks per day.   Social Determinants of Health   Financial Resource Strain:   . Difficulty of Paying Living Expenses:   Food Insecurity:   . Worried About Charity fundraiser in the Last Year:   . Arboriculturist in the Last Year:   Transportation Needs:   . Film/video editor (Medical):   [name] Lack of Transportation (Non-Medical):   Physical Activity:   . Days of Exercise per Week:   . Minutes of Exercise per Session:   Stress:   . Feeling of Stress :   Social Connections:   . Frequency of Communication with Friends and Family:   . Frequency of Social Gatherings with Friends and Family:   . Attends Religious Services:   . Active Member of Clubs or Organizations:   . Attends Archivist Meetings:   [name] Marital Status:      Family History: The patient's family history includes Breast cancer in her sister; CAD in her father; Colon cancer in her maternal grandfather and maternal uncle; Congestive Heart Failure in her father; Dementia in her father; Diabetes in an other family member; Esophageal cancer in her brother; Hepatitis C in her  brother; Lung cancer in her mother; Stroke in her father. There is no history of Rectal cancer, Stomach cancer, or Colon polyps.  ROS:   Please see the history of present illness.     All other systems reviewed and are negative.  EKGs/Labs/Other Studies Reviewed:    The following studies were reviewed today: Prior echocardiogram office notes EKG  ECHO 05/06/17: Compared to a prior echo in 2011, LVEF is lower at 40-45% with predominant inferior and inferoseptal hypokinesis.  Cardiac cath 05/06/2017 Conclusions: 1. No angiographically significant coronary artery disease. 2. Moderately to severely reduced left ventricular contraction (LVEF 30-35%), consistent with non-ischemic cardiomyopathy. 3. Upper normal to mildly elevated left heart, right heart, and pulmonary artery pressures. 4. Low normal to mildly decreased cardiac output/index.  Recommendations: 1. Optimize evidence-based heart failure therapy. 2. Primary prevention of coronary artery disease.  Nelva Bush, MD  ECHO 04/02/19:  1. Compared with the echo 09/4694, systolic function has improved. . Left  ventricular ejection fraction, by estimation, is 55 to 60%. Left  ventricular ejection fraction by PLAX is 65 %. The left ventricle has  normal function. The left ventricle has no  regional wall motion abnormalities. Left ventricular diastolic parameters  are consistent with Grade I diastolic dysfunction (impaired relaxation).  2. Right ventricular systolic function is normal. The right ventricular  size is normal. There is normal pulmonary artery systolic pressure.  3. The mitral valve is normal in structure and function. Trivial mitral  valve regurgitation. No evidence of mitral stenosis.  4. The aortic valve is tricuspid. Aortic valve regurgitation is mild. No  aortic stenosis is present.  5. The inferior vena cava is normal in size with greater than 50%  respiratory variability, suggesting right atrial  pressure of 3 mmHg.    EKG:  None today  Recent Labs: 03/15/2019: ALT 11; BUN 19; Creatinine, Ser 0.91; Hemoglobin 13.0; Platelets 223.0; Potassium 4.7; Sodium 139  Recent Lipid Panel    Component Value Date/Time   CHOL 148 10/30/2015 1102   TRIG 68.0 10/30/2015 1102   HDL 60.50 10/30/2015 1102   CHOLHDL 2 10/30/2015 1102   VLDL 13.6 10/30/2015 1102   LDLCALC 74 10/30/2015 1102   LDLDIRECT 166.0 04/14/2010 1539    Physical Exam:    VS:  BP (!) 130/80   Pulse 59   Ht '5\' 6"'$  (1.676 m)   Wt (!) 241 lb (109.3 kg)   BMI 38.90 kg/m     Wt Readings from Last 3 Encounters:  09/05/19 (!) 241 lb (109.3 kg)  07/04/19 228 lb (103.4 kg)  05/28/19 233 lb (105.7 kg)    GEN: Well nourished, well developed, in no acute distress, overweight HEENT: normal  Neck: no JVD, carotid bruits, or masses Cardiac: RRR; no murmurs, rubs, or gallops,no edema  Respiratory:  clear to auscultation bilaterally, normal work of breathing GI: soft, nontender, nondistended, + BS MS: no deformity or atrophy  Skin: warm and dry, no rash Neuro:  Alert and Oriented x 3, Strength and sensation are intact Psych: euthymic mood, full affect   ASSESSMENT:    1. Chronic combined systolic and diastolic CHF (congestive heart failure) ([place])   2. Essential hypertension   3. Nonischemic cardiomyopathy ([place])   4. Morbid obesity ([place])    PLAN:    In order of problems listed above:  Chronic combined systolic and diastolic heart failure/nonischemic cardiomyopathy now with normal EF - EF approximately 30% on cath, 40% on initial echocardiogram.  [name] has been increased.  Thankfully, her current ejection fraction is now normal 55 to 60%, see echocardiogram as above.  Overall doing fairly well, NYHA class II.  She still feels some shortness of breath with activity.  Continue to encourage exercise.  Creatinine 0.98, potassium 4.4. Of course of her weight increases by 3 pounds, she may take her Lasix 20 mg as  needed.  Essential hypertension -Currently well controlled.  Continue with Entresto.  Morbid obesity - At prior visit, suggested weight watchers because of lack of insurance pay for nutrition counseling.  Continue to encourage weight loss  Foot fracture -Podiatry.  Medication Adjustments/Labs and Tests Ordered: Current medicines are reviewed at length with the patient today.  Concerns regarding medicines are outlined above.  Orders Placed This Encounter  Procedures  . EKG 12-Lead   No orders of the defined types were placed in this encounter.   Patient Instructions  Medication Instructions:  The current medical regimen is effective;  continue present plan and medications.  *If you need a refill on your cardiac medications before your next appointment, please call your pharmacy*  Follow-Up: At [facility], you and your health needs are our priority.  As part of our continuing mission to provide you with exceptional heart care, we have created designated Provider Care Teams.  These Care Teams include your primary Cardiologist (physician) and Advanced Practice Providers (APPs -  Physician Assistants and Nurse Practitioners) who all work together to provide you with the care you need, when you need it.  Your next appointment:   6 month(s)  The format for your next appointment:   In Person  Provider:   Candee Furbish, MD   Thank you for choosing [facility]!!        Signed, Candee Furbish, MD  09/05/2019 4:18 PM  [facility]

## 2019-09-05 NOTE — Patient Instructions (Signed)
Medication Instructions:  The current medical regimen is effective;  continue present plan and medications.  *If you need a refill on your cardiac medications before your next appointment, please call your pharmacy*  Follow-Up: At [facility], you and your health needs are our priority.  As part of our continuing mission to provide you with exceptional heart care, we have created designated Provider Care Teams.  These Care Teams include your primary Cardiologist (physician) and Advanced Practice Providers (APPs -  Physician Assistants and Nurse Practitioners) who all work together to provide you with the care you need, when you need it.  Your next appointment:   6 month(s)  The format for your next appointment:   In Person  Provider:   Mark Skains, MD  Thank you for choosing [facility]!!     

## 2019-09-07 DIAGNOSIS — H40003 Preglaucoma, unspecified, bilateral: Secondary | ICD-10-CM | POA: Diagnosis not present

## 2019-09-13 ENCOUNTER — Emergency Department (HOSPITAL_BASED_OUTPATIENT_CLINIC_OR_DEPARTMENT_OTHER): Payer: Medicare Other

## 2019-09-13 ENCOUNTER — Encounter (HOSPITAL_BASED_OUTPATIENT_CLINIC_OR_DEPARTMENT_OTHER): Payer: Self-pay | Admitting: Emergency Medicine

## 2019-09-13 ENCOUNTER — Other Ambulatory Visit: Payer: Self-pay

## 2019-09-13 DIAGNOSIS — Z87891 Personal history of nicotine dependence: Secondary | ICD-10-CM | POA: Diagnosis not present

## 2019-09-13 DIAGNOSIS — Z79899 Other long term (current) drug therapy: Secondary | ICD-10-CM | POA: Insufficient documentation

## 2019-09-13 DIAGNOSIS — S199XXA Unspecified injury of neck, initial encounter: Secondary | ICD-10-CM | POA: Diagnosis not present

## 2019-09-13 DIAGNOSIS — W0110XA Fall on same level from slipping, tripping and stumbling with subsequent striking against unspecified object, initial encounter: Secondary | ICD-10-CM | POA: Insufficient documentation

## 2019-09-13 DIAGNOSIS — I5042 Chronic combined systolic (congestive) and diastolic (congestive) heart failure: Secondary | ICD-10-CM | POA: Diagnosis not present

## 2019-09-13 DIAGNOSIS — Z7982 Long term (current) use of aspirin: Secondary | ICD-10-CM | POA: Insufficient documentation

## 2019-09-13 DIAGNOSIS — M2578 Osteophyte, vertebrae: Secondary | ICD-10-CM | POA: Diagnosis not present

## 2019-09-13 DIAGNOSIS — Y939 Activity, unspecified: Secondary | ICD-10-CM | POA: Insufficient documentation

## 2019-09-13 DIAGNOSIS — Y929 Unspecified place or not applicable: Secondary | ICD-10-CM | POA: Diagnosis not present

## 2019-09-13 DIAGNOSIS — R001 Bradycardia, unspecified: Secondary | ICD-10-CM | POA: Diagnosis not present

## 2019-09-13 DIAGNOSIS — S0083XA Contusion of other part of head, initial encounter: Secondary | ICD-10-CM | POA: Insufficient documentation

## 2019-09-13 DIAGNOSIS — I110 Hypertensive heart disease with heart failure: Secondary | ICD-10-CM | POA: Diagnosis not present

## 2019-09-13 DIAGNOSIS — M47812 Spondylosis without myelopathy or radiculopathy, cervical region: Secondary | ICD-10-CM | POA: Diagnosis not present

## 2019-09-13 DIAGNOSIS — G319 Degenerative disease of nervous system, unspecified: Secondary | ICD-10-CM | POA: Diagnosis not present

## 2019-09-13 DIAGNOSIS — Z96651 Presence of right artificial knee joint: Secondary | ICD-10-CM | POA: Diagnosis not present

## 2019-09-13 DIAGNOSIS — I6529 Occlusion and stenosis of unspecified carotid artery: Secondary | ICD-10-CM | POA: Diagnosis not present

## 2019-09-13 DIAGNOSIS — Y999 Unspecified external cause status: Secondary | ICD-10-CM | POA: Insufficient documentation

## 2019-09-13 DIAGNOSIS — S161XXA Strain of muscle, fascia and tendon at neck level, initial encounter: Secondary | ICD-10-CM | POA: Diagnosis not present

## 2019-09-13 DIAGNOSIS — I10 Essential (primary) hypertension: Secondary | ICD-10-CM | POA: Diagnosis not present

## 2019-09-13 DIAGNOSIS — S0990XA Unspecified injury of head, initial encounter: Secondary | ICD-10-CM | POA: Diagnosis not present

## 2019-09-13 NOTE — ED Triage Notes (Signed)
Pt states she fell last night and hit her head  Denies LOC but states she does not remember falling  Pt states she got up to go to the bathroom and states she just remembers she was sitting in the floor  States her son heard her fall and came in to help her  Pt states she did not seek treatment last night  Tonight she is c/o headache

## 2019-09-14 ENCOUNTER — Emergency Department (HOSPITAL_BASED_OUTPATIENT_CLINIC_OR_DEPARTMENT_OTHER): Payer: Medicare Other

## 2019-09-14 ENCOUNTER — Other Ambulatory Visit: Payer: Self-pay

## 2019-09-14 ENCOUNTER — Telehealth: Payer: Self-pay | Admitting: Internal Medicine

## 2019-09-14 ENCOUNTER — Emergency Department (HOSPITAL_BASED_OUTPATIENT_CLINIC_OR_DEPARTMENT_OTHER)
Admission: EM | Admit: 2019-09-14 | Discharge: 2019-09-14 | Disposition: A | Discharge status: Home / Self Care | Payer: Medicare Other | Attending: Emergency Medicine | Admitting: Emergency Medicine

## 2019-09-14 DIAGNOSIS — S161XXA Strain of muscle, fascia and tendon at neck level, initial encounter: Secondary | ICD-10-CM

## 2019-09-14 DIAGNOSIS — S0083XA Contusion of other part of head, initial encounter: Secondary | ICD-10-CM

## 2019-09-14 DIAGNOSIS — M47812 Spondylosis without myelopathy or radiculopathy, cervical region: Secondary | ICD-10-CM | POA: Diagnosis not present

## 2019-09-14 DIAGNOSIS — W19XXXA Unspecified fall, initial encounter: Secondary | ICD-10-CM

## 2019-09-14 DIAGNOSIS — M2578 Osteophyte, vertebrae: Secondary | ICD-10-CM | POA: Diagnosis not present

## 2019-09-14 DIAGNOSIS — S199XXA Unspecified injury of neck, initial encounter: Secondary | ICD-10-CM | POA: Diagnosis not present

## 2019-09-14 MED ORDER — ACETAMINOPHEN 500 MG PO TABS
1000.0000 mg | ORAL_TABLET | Freq: Once | ORAL | Status: AC
  Administered 2019-09-14: 1000 mg via ORAL
  Filled 2019-09-14: qty 2

## 2019-09-14 NOTE — ED Provider Notes (Signed)
[facility] Provider Note: Georgena Spurling, MD, FACEP  CSN: [id number] MRN: [id number] ARRIVAL: 09/13/19 at 2307 ROOM: [place]  Fall   HISTORY OF PRESENT ILLNESS  09/14/19 3:58 AM [name] is a 74 y.o. female who fell the evening before last and hit her head.  She has a hematoma to her left forehead.  Although she denies loss of consciousness she does not remember falling.  She only remembers getting up to go to the bathroom and then sitting on the floor.  She denies any prodromal symptoms.  She is having a headache which she rates as a 7 out of 10, aching in nature.  She is also having pain in the back of her neck.   Past Medical History:  Diagnosis Date  . Allergy   . Anemia   . Anxiety   . Barrett's esophagus   . Bilateral carpal tunnel syndrome    Dr. Eddie Dibbles, having injection therapy  . C. difficile diarrhea 08/01/2012   severe 2014  . Carotid arterial disease ([place])   . Cataract    removed both eyes   . Chronic combined systolic and diastolic CHF (congestive heart failure) ([place])   . Chronic cystitis    Dr. Matilde Sprang  . Chronic lower back pain   . Chronic pain syndrome   . Depression   . Dizziness   . DJD (degenerative joint disease)    bilateral hands; knees  . Family history of anesthesia complication    Mother had severe N/V  . Fibromyalgia   . GERD (gastroesophageal reflux disease)   . H/O cardiac catheterization    (-) cath 12-2002  , cath again 2011 (-)  . History of colon polyps   . HTN (hypertension)   . Hyperlipidemia    past hx   . IBS (irritable bowel syndrome)   . Insomnia   . Migraine   . Morbid obesity ([place])   . Non-ischemic cardiomyopathy ([place])   . Osteoporosis    pt unsure of this  . RLS (restless legs syndrome)   . Vertigo   . Vitamin B 12 deficiency 04/09/2013    Past Surgical History:  Procedure Laterality Date  . Arm surgery Left    "don't remember what they did; arm wasn't broken"  . BILATERAL  KNEE ARTHROSCOPY Bilateral   . BIOPSY  08/07/2018   Procedure: BIOPSY;  Surgeon: Ladene Artist, MD;  Location: [facility];  Service: Endoscopy;;  . CARDIAC CATHETERIZATION  01/22/10   clean cath  . CATARACT EXTRACTION W/ INTRAOCULAR LENS  IMPLANT, BILATERAL Bilateral   . CHOLECYSTECTOMY N/A 10/25/2016   Procedure: LAPAROSCOPIC CHOLECYSTECTOMY;  Surgeon: Georganna Skeans, MD;  Location: [facility];  Service: General;  Laterality: N/A;  . COLONOSCOPY    . COLONOSCOPY W/ POLYPECTOMY    . ESOPHAGOGASTRODUODENOSCOPY (EGD) WITH PROPOFOL N/A 08/07/2018   Procedure: ESOPHAGOGASTRODUODENOSCOPY (EGD) WITH PROPOFOL;  Surgeon: Ladene Artist, MD;  Location: [facility];  Service: Endoscopy;  Laterality: N/A;  . FOOT SURGERY Bilateral    toenails removed; callus removed on right; hammertoes right"  . Knot     "removed from right neck; not a goiter"  . LUMBAR DISC SURGERY     L5 S1 anterior fusion  . OOPHORECTOMY    . RIGHT/LEFT HEART CATH AND CORONARY ANGIOGRAPHY N/A 05/06/2017   Procedure: RIGHT/LEFT HEART CATH AND CORONARY ANGIOGRAPHY;  Surgeon: Nelva Bush, MD;  Location: [facility];  Service: Cardiovascular;  Laterality: N/A;  .  SHOULDER ARTHROSCOPY Right   . TOTAL KNEE ARTHROPLASTY  10/05/2011   Procedure: TOTAL KNEE ARTHROPLASTY;  Surgeon: Hessie Dibble, MD;  Location: [facility];  Service: Orthopedics;  Laterality: Right;  . UPPER GASTROINTESTINAL ENDOSCOPY    . VAGINAL HYSTERECTOMY     for endometriosis    Family History  Problem Relation Age of Onset  . Lung cancer Mother   . Dementia Father   . CAD Father        dx in his 31s  . Stroke Father   . Congestive Heart Failure Father   . Colon cancer Maternal Grandfather   . Esophageal cancer Brother   . Breast cancer Sister   . Diabetes Other        Aunt  . Colon cancer Maternal Uncle        2  . Hepatitis C Brother   . Rectal cancer Neg Hx   . Stomach cancer Neg Hx   . Colon polyps Neg Hx     Social History    Tobacco Use  . Smoking status: Former Smoker    Packs/day: 0.50    Years: 10.00    Pack years: 5.00    Quit date: 02/09/1975    Years since quitting: 44.6  . Smokeless tobacco: Never Used  . Tobacco comment: started at age 23.   quit in the 39s.  Vaping Use  . Vaping Use: Never used  Substance Use Topics  . Alcohol use: Not Currently    Alcohol/week: 0.0 standard drinks    Comment: 2 glasses of wine per year  . Drug use: No    Comment: CBD and hemp OIL     Prior to Admission medications   Medication Sig Start Date End Date Taking? Authorizing Provider  aspirin EC 81 MG tablet Take 81 mg by mouth daily.    [provider]  cholecalciferol (VITAMIN D) 1000 units tablet Take 1,000 Units by mouth daily.    [provider]  dicyclomine (BENTYL) 20 MG tablet Take 1 tablet (20 mg total) by mouth 4 (four) times daily -  before meals and at bedtime. 02/13/19   Ladene Artist, MD  furosemide (LASIX) 20 MG tablet Take 20 mg by mouth as needed for fluid or edema.    [provider]  gabapentin (NEURONTIN) 300 MG capsule Take 300 mg by mouth 3 (three) times daily with meals. 07/07/19   [provider]  HYDROcodone-acetaminophen (NORCO) 10-325 MG tablet Take 1-2 tablets by mouth daily.    [provider]  loratadine (CLARITIN) 10 MG tablet Take 10 mg by mouth daily as needed for allergies.    [provider]  meclizine (ANTIVERT) 12.5 MG tablet Take 1-2 tablets (12.5-25 mg total) by mouth 3 (three) times daily as needed for dizziness. 09/09/17   Colon Branch, MD  metoprolol succinate (TOPROL-XL) 50 MG 24 hr tablet TAKE 1 AND 1/2 TABLET BY MOUTH EVERY DAY 01/23/19   Colon Branch, MD  [facility] 4 MG/0.1ML LIQD nasal spray kit Place 1 spray into the nose once as needed (opioid od).  04/24/18   [provider]  nitroGLYCERIN (NITROSTAT) 0.4 MG SL tablet Place 1 tablet (0.4 mg total) under the tongue every 5 (five) minutes as needed for chest pain.  05/08/17   Aline August, MD  omeprazole (PRILOSEC) 40 MG capsule Take 1 capsule (40 mg total) by mouth 2 (two) times daily. 03/14/19   Ladene Artist, MD  pregabalin (LYRICA) 50  MG capsule Take 50 mg by mouth 3 (three) times daily. 08/08/19   [provider]  Propylene Glycol (SYSTANE BALANCE OP) Place 1 drop into both eyes 2 (two) times daily as needed (DRY EYES).     [provider]  rOPINIRole (REQUIP) 1 MG tablet TAKE 1 TABLET BY MOUTH TWICE A DAY 05/09/19   Lowne Chase, Yvonne R, DO  sacubitril-valsartan (ENTRESTO) 97-103 MG Take 1 tablet by mouth 2 (two) times daily. 11/17/18   Jerline Pain, MD  sertraline (ZOLOFT) 50 MG tablet TAKE 1 AND 1/2 TABLETS BY MOUTH DAILY 05/09/19   Carollee Herter, Alferd Apa, DO  traZODone (DESYREL) 50 MG tablet TAKE 1-2 TABLETS (50-100 MG TOTAL) BY MOUTH AT BEDTIME. 05/09/19   Ann Held, DO    Allergies Dextromethorphan-guaifenesin, Morphine, and Penicillins   REVIEW OF SYSTEMS  Negative except as noted here or in the History of Present Illness.   PHYSICAL EXAMINATION  Initial Vital Signs Blood pressure (!) 152/72, pulse 62, temperature 98.6 F (37 C), temperature source Oral, resp. rate 20, height '5\' 6"'  (1.676 m), weight 107.5 kg, SpO2 99 %.  Examination General: Well-developed, well-nourished female in no acute distress; appearance consistent with age of record HENT: normocephalic; no hemotympanum; left forehead hematoma:    Eyes: pupils equal, round and reactive to light; extraocular muscles intact Neck: supple; lower posterior C-spine tenderness Heart: regular rate and rhythm Lungs: clear to auscultation bilaterally Abdomen: soft; nondistended; nontender; bowel sounds present Extremities: No deformity; full range of motion; pulses normal Neurologic: Awake, alert and oriented; motor function intact in all extremities and symmetric; no facial droop Skin: Warm and dry Psychiatric: Normal mood and affect   RESULTS   Summary of this visit's results, reviewed and interpreted by myself:   EKG Interpretation  Date/Time:  Friday September 14 2019 04:28:29 EDT Ventricular Rate:  53 PR Interval:    QRS Duration: 159 QT Interval:  523 QTC Calculation: 492 R Axis:   49 Text Interpretation: Sinus bradycardia Right bundle branch block Rate is slower Confirmed by Kendle Turbin [phone]) on 09/14/2019 4:30:24 AM      Laboratory Studies: No results found for this or any previous visit (from the past 24 hour(s)). Imaging Studies: CT Head Wo Contrast  Result Date: 09/14/2019 CLINICAL DATA:  [place] and hit head EXAM: CT HEAD WITHOUT CONTRAST TECHNIQUE: Contiguous axial images were obtained from the base of the skull through the vertex without intravenous contrast. COMPARISON:  CT 05/04/2017, MRI 09/08/2017 FINDINGS: Brain: No acute territorial infarction, hemorrhage or intracranial mass. Mild atrophy. Mild hypodensity in the white matter consistent with chronic small vessel ischemic change. Vascular: No hyperdense vessels.  Carotid vascular calcification. Skull: Normal. Negative for fracture or focal lesion. Sinuses/Orbits: No acute finding. Other: None IMPRESSION: 1. No CT evidence for acute intracranial abnormality. 2. Atrophy and mild chronic small vessel ischemic changes of the white matter. Electronically Signed   By: Donavan Foil M.D.   On: 09/14/2019 00:03   CT Cervical Spine Wo Contrast  Result Date: 09/14/2019 CLINICAL DATA:  Neck trauma.  Patient fell last night EXAM: CT CERVICAL SPINE WITHOUT CONTRAST TECHNIQUE: Multidetector CT imaging of the cervical spine was performed without intravenous contrast. Multiplanar CT image reconstructions were also generated. COMPARISON:  None. FINDINGS: Alignment: Normal. Skull base and vertebrae: No acute fracture. No primary bone lesion or focal pathologic process. Soft tissues and spinal canal: No prevertebral fluid or swelling. No visible canal hematoma. Disc levels: Degenerative  changes with narrowed  disc spaces and endplate osteophyte formation diffusely. Prominent osteophytes cause impression on the anterior thecal sac most prominent at C5-6. Upper chest: The lung apices are clear. Other: None. IMPRESSION: Degenerative changes in the cervical spine. No acute displaced fractures identified. Electronically Signed   By: Lucienne Capers M.D.   On: 09/14/2019 04:26    ED COURSE and MDM  Nursing notes, initial and subsequent vitals signs, including pulse oximetry, reviewed and interpreted by myself.  Vitals:   09/13/19 2329 09/13/19 2332 09/14/19 0240  BP:  (!) 164/71 (!) 152/72  Pulse:  (!) 57 62  Resp:  16 20  Temp:  98.6 F (37 C) 98.6 F (37 C)  TempSrc:  Oral Oral  SpO2:  97% 99%  Weight: 107.5 kg    Height: '5\' 6"'  (1.676 m)     Medications  acetaminophen (TYLENOL) tablet 1,000 mg (has no administration in time range)    There is no evidence of significant intracranial or cervical spine injury on radiograph.  The cause of her fall is unclear.  She has been noted to be in sinus bradycardia when at rest.  She could have had a syncopal episode but reports no prodromal symptoms.  We will have her follow-up with her PCP Dr. Larose Kells.  PROCEDURES  Procedures   ED DIAGNOSES     ICD-10-CM   1. Fall, initial encounter  W19.XXXA   2. Traumatic hematoma of forehead, initial encounter  S00.83XA   3. Neck strain, initial encounter  S16.1XXA        [place] Kappes, Jenny Reichmann, MD 09/14/19 [phone]

## 2019-09-14 NOTE — Telephone Encounter (Signed)
Please call the patient and schedule a ER follow-up for next week.

## 2019-09-14 NOTE — Telephone Encounter (Signed)
Appointment scheduled on 09/18/19

## 2019-09-18 ENCOUNTER — Other Ambulatory Visit: Payer: Self-pay

## 2019-09-18 ENCOUNTER — Encounter: Payer: Self-pay | Admitting: Internal Medicine

## 2019-09-18 ENCOUNTER — Ambulatory Visit (INDEPENDENT_AMBULATORY_CARE_PROVIDER_SITE_OTHER): Payer: Medicare Other | Admitting: Internal Medicine

## 2019-09-18 VITALS — BP 154/61 | HR 63 | Temp 98.5°F | Resp 12 | Ht 66.0 in | Wt 239.4 lb

## 2019-09-18 DIAGNOSIS — I10 Essential (primary) hypertension: Secondary | ICD-10-CM | POA: Diagnosis not present

## 2019-09-18 DIAGNOSIS — W19XXXD Unspecified fall, subsequent encounter: Secondary | ICD-10-CM | POA: Diagnosis not present

## 2019-09-18 DIAGNOSIS — R001 Bradycardia, unspecified: Secondary | ICD-10-CM | POA: Diagnosis not present

## 2019-09-18 DIAGNOSIS — E538 Deficiency of other specified B group vitamins: Secondary | ICD-10-CM | POA: Diagnosis not present

## 2019-09-18 LAB — CBC WITH DIFFERENTIAL/PLATELET
Basophils Absolute: 0 10*3/uL (ref 0.0–0.1)
Basophils Relative: 0.4 % (ref 0.0–3.0)
Eosinophils Absolute: 0.1 10*3/uL (ref 0.0–0.7)
Eosinophils Relative: 1 % (ref 0.0–5.0)
HCT: 36.9 % (ref 36.0–46.0)
Hemoglobin: 12.3 g/dL (ref 12.0–15.0)
Lymphocytes Relative: 31.3 % (ref 12.0–46.0)
Lymphs Abs: 2.1 10*3/uL (ref 0.7–4.0)
MCHC: 33.2 g/dL (ref 30.0–36.0)
MCV: 90.7 fl (ref 78.0–100.0)
Monocytes Absolute: 0.3 10*3/uL (ref 0.1–1.0)
Monocytes Relative: 5.2 % (ref 3.0–12.0)
Neutro Abs: 4.1 10*3/uL (ref 1.4–7.7)
Neutrophils Relative %: 62.1 % (ref 43.0–77.0)
Platelets: 191 10*3/uL (ref 150.0–400.0)
RBC: 4.07 Mil/uL (ref 3.87–5.11)
RDW: 13.6 % (ref 11.5–15.5)
WBC: 6.6 10*3/uL (ref 4.0–10.5)

## 2019-09-18 LAB — BASIC METABOLIC PANEL
BUN: 19 mg/dL (ref 6–23)
CO2: 28 mEq/L (ref 19–32)
Calcium: 8.7 mg/dL (ref 8.4–10.5)
Chloride: 106 mEq/L (ref 96–112)
Creatinine, Ser: 0.92 mg/dL (ref 0.40–1.20)
GFR: 59.52 mL/min — ABNORMAL LOW (ref >60.00)
Glucose, Bld: 93 mg/dL (ref 70–99)
Potassium: 3.8 mEq/L (ref 3.5–5.1)
Sodium: 142 mEq/L (ref 135–145)

## 2019-09-18 LAB — VITAMIN B12: Vitamin B-12: 317 pg/mL (ref 211–911)

## 2019-09-18 NOTE — Patient Instructions (Addendum)
Decrease metoprolol XL 50: Only half tablet daily.  Other medications the same. Good hydration  Check the  blood pressure daily BP GOAL is between 110/65 and  135/85. If it is consistently higher or lower, let me know   GO TO THE LAB : Get the blood work     [place], [place] back for a checkup in 1 month   Fall Prevention in the Home, Adult Falls can cause injuries and can affect people from all age groups. There are many simple things that you can do to make your home safe and to help prevent falls. Ask for help when making these changes, if needed. What actions can I take to prevent falls? General instructions  Use good lighting in all rooms. Replace any light bulbs that burn out.  Turn on lights if it is dark. Use night-lights.  Place frequently used items in easy-to-reach places. Lower the shelves around your home if necessary.  Set up furniture so that there are clear paths around it. Avoid moving your furniture around.  Remove throw rugs and other tripping hazards from the floor.  Avoid walking on wet floors.  Fix any uneven floor surfaces.  Add color or contrast paint or tape to grab bars and handrails in your home. Place contrasting color strips on the first and last steps of stairways.  When you use a stepladder, make sure that it is completely opened and that the sides are firmly locked. Have someone hold the ladder while you are using it. Do not climb a closed stepladder.  Be aware of any and all pets. What can I do in the bathroom?      Keep the floor dry. Immediately clean up any water that spills onto the floor.  Remove soap buildup in the tub or shower on a regular basis.  Use non-skid mats or decals on the floor of the tub or shower.  Attach bath mats securely with double-sided, non-slip rug tape.  If you need to sit down while you are in the shower, use a plastic, non-slip stool.  Install grab bars by  the toilet and in the tub and shower. Do not use towel bars as grab bars. What can I do in the bedroom?  Make sure that a bedside light is easy to reach.  Do not use oversized bedding that drapes onto the floor.  Have a firm chair that has side arms to use for getting dressed. What can I do in the kitchen?  Clean up any spills right away.  If you need to reach for something above you, use a sturdy step stool that has a grab bar.  Keep electrical cables out of the way.  Do not use floor polish or wax that makes floors slippery. If you must use wax, make sure that it is non-skid floor wax. What can I do in the stairways?  Do not leave any items on the stairs.  Make sure that you have a light switch at the top of the stairs and the bottom of the stairs. Have them installed if you do not have them.  Make sure that there are handrails on both sides of the stairs. Fix handrails that are broken or loose. Make sure that handrails are as long as the stairways.  Install non-slip stair treads on all stairs in your home.  Avoid having throw rugs at the top or bottom of stairways, or secure the rugs with carpet  tape to prevent them from moving.  Choose a carpet design that does not hide the edge of steps on the stairway.  Check any carpeting to make sure that it is firmly attached to the stairs. Fix any carpet that is loose or worn. What can I do on the outside of my home?  Use bright outdoor lighting.  Regularly repair the edges of walkways and driveways and fix any cracks.  Remove high doorway thresholds.  Trim any shrubbery on the main path into your home.  Regularly check that handrails are securely fastened and in good repair. Both sides of any steps should have handrails.  Install guardrails along the edges of any raised decks or porches.  Clear walkways of debris and clutter, including tools and rocks.  Have leaves, snow, and ice cleared regularly.  Use sand or salt on  walkways during winter months.  In the garage, clean up any spills right away, including grease or oil spills. What other actions can I take?  Wear closed-toe shoes that fit well and support your feet. Wear shoes that have rubber soles or low heels.  Use mobility aids as needed, such as canes, walkers, scooters, and crutches.  Review your medicines with your health care provider. Some medicines can cause dizziness or changes in blood pressure, which increase your risk of falling. Talk with your health care provider about other ways that you can decrease your risk of falls. This may include working with a physical therapist or trainer to improve your strength, balance, and endurance. Where to find more information  Centers for Disease Control and Prevention, [employer]: WebmailGuide.co.za  [employer] on Aging: BrainJudge.co.uk Contact a health care provider if:  You are afraid of falling at home.  You feel weak, drowsy, or dizzy at home.  You fall at home. Summary  There are many simple things that you can do to make your home safe and to help prevent falls.  Ways to make your home safe include removing tripping hazards and installing grab bars in the bathroom.  Ask for help when making these changes in your home. This information is not intended to replace advice given to you by your health care provider. Make sure you discuss any questions you have with your health care provider. Document Revised: 01/07/2017 Document Reviewed: 09/09/2016 Elsevier Patient Education  2020 [employer].

## 2019-09-18 NOTE — Progress Notes (Signed)
Subjective:    Patient ID: [name], female    DOB: 1945-09-11, 74 y.o.   MRN: [id number]  DOS:  09/18/2019 Type of visit - description: ER follow-up Went to the ER 09/14/2019 with the following problems: She was sleeping in her bed, woke up to go to the bathroom. Several steps later she fell. She is not sure if she lost consciousness or if it was a mechanical fall. Her son was immediately there, apparently she was not postictal. She did urinate on herself but she thinks is because her bladder was full. At the ER: CT head and cervical spine with no acute findings. EKG sinus bradycardia.  Since the ER visit, No further falls or problems. Did have a headache after the fall but that is improving. Did have neck pain: That is resolved.   BP Readings from Last 3 Encounters:  09/18/19 (!) 154/61  09/14/19 (!) 150/68  09/05/19 (!) 130/80    Review of Systems Denies chest pain, difficulty breathing or lower extremity edema She continue with lower extremity paresthesias  Past Medical History:  Diagnosis Date  . Allergy   . Anemia   . Anxiety   . Barrett's esophagus   . Bilateral carpal tunnel syndrome    Dr. Eddie Dibbles, having injection therapy  . C. difficile diarrhea 08/01/2012   severe 2014  . Carotid arterial disease ([place])   . Cataract    removed both eyes   . Chronic combined systolic and diastolic CHF (congestive heart failure) ([place])   . Chronic cystitis    Dr. Matilde Sprang  . Chronic lower back pain   . Chronic pain syndrome   . Depression   . Dizziness   . DJD (degenerative joint disease)    bilateral hands; knees  . Family history of anesthesia complication    Mother had severe N/V  . Fibromyalgia   . GERD (gastroesophageal reflux disease)   . H/O cardiac catheterization    (-) cath 12-2002  , cath again 2011 (-)  . History of colon polyps   . HTN (hypertension)   . Hyperlipidemia    past hx   . IBS (irritable bowel syndrome)   . Insomnia   . Migraine   .  Morbid obesity ([place])   . Non-ischemic cardiomyopathy ([place])   . Osteoporosis    pt unsure of this  . RLS (restless legs syndrome)   . Vertigo   . Vitamin B 12 deficiency 04/09/2013    Past Surgical History:  Procedure Laterality Date  . Arm surgery Left    "don't remember what they did; arm wasn't broken"  . BILATERAL KNEE ARTHROSCOPY Bilateral   . BIOPSY  08/07/2018   Procedure: BIOPSY;  Surgeon: Ladene Artist, MD;  Location: [facility];  Service: Endoscopy;;  . CARDIAC CATHETERIZATION  01/22/10   clean cath  . CATARACT EXTRACTION W/ INTRAOCULAR LENS  IMPLANT, BILATERAL Bilateral   . CHOLECYSTECTOMY N/A 10/25/2016   Procedure: LAPAROSCOPIC CHOLECYSTECTOMY;  Surgeon: Georganna Skeans, MD;  Location: [facility];  Service: General;  Laterality: N/A;  . COLONOSCOPY    . COLONOSCOPY W/ POLYPECTOMY    . ESOPHAGOGASTRODUODENOSCOPY (EGD) WITH PROPOFOL N/A 08/07/2018   Procedure: ESOPHAGOGASTRODUODENOSCOPY (EGD) WITH PROPOFOL;  Surgeon: Ladene Artist, MD;  Location: [facility];  Service: Endoscopy;  Laterality: N/A;  . FOOT SURGERY Bilateral    toenails removed; callus removed on right; hammertoes right"  . Knot     "removed from right neck; not a goiter"  . LUMBAR  DISC SURGERY     L5 S1 anterior fusion  . OOPHORECTOMY    . RIGHT/LEFT HEART CATH AND CORONARY ANGIOGRAPHY N/A 05/06/2017   Procedure: RIGHT/LEFT HEART CATH AND CORONARY ANGIOGRAPHY;  Surgeon: Nelva Bush, MD;  Location: [facility];  Service: Cardiovascular;  Laterality: N/A;  . SHOULDER ARTHROSCOPY Right   . TOTAL KNEE ARTHROPLASTY  10/05/2011   Procedure: TOTAL KNEE ARTHROPLASTY;  Surgeon: Hessie Dibble, MD;  Location: [facility];  Service: Orthopedics;  Laterality: Right;  . UPPER GASTROINTESTINAL ENDOSCOPY    . VAGINAL HYSTERECTOMY     for endometriosis    Allergies as of 09/18/2019      Reactions   Dextromethorphan-guaifenesin Nausea And Vomiting   Morphine Nausea And Vomiting   Gabapentin Other (See  Comments)   Dizziness, made her feel loopy   Penicillins Rash   "> 30 years ago; best I can remember it was just a light rash on my arm" Did it involve swelling of the face/tongue/throat, SOB, or low BP? No Did it involve sudden or severe rash/hives, skin peeling, or any reaction on the inside of your mouth or nose? Unknown Did you need to seek medical attention at a hospital or doctor's office? No When did it last happen?more than 30 years  If all above answers are "NO", may proceed with cephalosporin use.      Medication List       Accurate as of September 18, 2019 11:59 PM. If you have any questions, ask your nurse or doctor.        STOP taking these medications   gabapentin 300 MG capsule Commonly known as: NEURONTIN Stopped by: Kathlene November, MD     TAKE these medications   aspirin EC 81 MG tablet Take 81 mg by mouth daily.   cholecalciferol 1000 units tablet Commonly known as: VITAMIN D Take 1,000 Units by mouth daily.   dicyclomine 20 MG tablet Commonly known as: BENTYL Take 1 tablet (20 mg total) by mouth 4 (four) times daily -  before meals and at bedtime.   Entresto 97-103 MG Generic drug: sacubitril-valsartan Take 1 tablet by mouth 2 (two) times daily.   furosemide 20 MG tablet Commonly known as: LASIX Take 20 mg by mouth as needed for fluid or edema.   HYDROcodone-acetaminophen 10-325 MG tablet Commonly known as: NORCO Take 1-2 tablets by mouth daily.   loratadine 10 MG tablet Commonly known as: CLARITIN Take 10 mg by mouth daily as needed for allergies.   meclizine 12.5 MG tablet Commonly known as: ANTIVERT Take 1-2 tablets (12.5-25 mg total) by mouth 3 (three) times daily as needed for dizziness.   metoprolol succinate 50 MG 24 hr tablet Commonly known as: TOPROL-XL TAKE 1 AND 1/2 TABLET BY MOUTH EVERY DAY   Narcan 4 MG/0.1ML Liqd nasal spray kit Generic drug: naloxone Place 1 spray into the nose once as needed (opioid od).   nitroGLYCERIN  0.4 MG SL tablet Commonly known as: NITROSTAT Place 1 tablet (0.4 mg total) under the tongue every 5 (five) minutes as needed for chest pain.   omeprazole 40 MG capsule Commonly known as: PRILOSEC Take 1 capsule (40 mg total) by mouth 2 (two) times daily.   pregabalin 50 MG capsule Commonly known as: LYRICA Take 50 mg by mouth 3 (three) times daily.   rOPINIRole 1 MG tablet Commonly known as: REQUIP TAKE 1 TABLET BY MOUTH TWICE A DAY   sertraline 50 MG tablet Commonly known as: ZOLOFT TAKE  1 AND 1/2 TABLETS BY MOUTH DAILY   SYSTANE BALANCE OP Place 1 drop into both eyes 2 (two) times daily as needed (DRY EYES).   traZODone 50 MG tablet Commonly known as: DESYREL TAKE 1-2 TABLETS (50-100 MG TOTAL) BY MOUTH AT BEDTIME.          Objective:   Physical Exam BP (!) 154/61 (BP Location: Right Arm, Cuff Size: Large)   Pulse 63   Temp 98.5 F (36.9 C) (Oral)   Resp 12   Ht '5\' 6"'  (1.676 m)   Wt 239 lb 6.4 oz (108.6 kg)   SpO2 99%   BMI 38.64 kg/m  General:   Well developed, NAD, BMI noted. HEENT:  Normocephalic . Face symmetric, atraumatic Lungs:  CTA B Normal respiratory effort, no intercostal retractions, no accessory muscle use. Heart: RRR,  no murmur.  Lower extremities: no pretibial edema bilaterally  Skin: Not pale. Not jaundice Neurologic:  alert & oriented X3.  Speech normal, gait appropriate for age and unassisted Psych--  Cognition and judgment appear intact.  Cooperative with normal attention span and concentration.  Behavior appropriate. No anxious or depressed appearing.      Assessment     Assessment HTN Hyperlipidemia-  Pravachol, Crestor: Myalgias. Intolerant to Zetia (joint aches), see OV  06/2016  Depression, Anxiety (on clonazepam), insomnia (on trazodone):  depression x many  years, remotely on zoloft and other meds per psych, I rx lexapro 2015, then was rx effexor x a while Morbid obesity CV: CHF/ non ischemic cardiomyopathy dx  04-2017; cath normal coronaries, EF ~ 30% Carotid artery disease: 1 to 39% bilateral carotids 09-2016, start aspirin per neurology GI:  --GERD, Barrett's esophagus, had a EGD 07/2018  --h/o persistent C. difficile diarrhea 2014 B12 deficiency Osteopenia: Dexa 2015 showed osteopenia, T score -1.9 (10/2016): Rx cs, vit D PULM: --NPSG 2008:  No osa, PLMS 4/hr with arousal and awakening --RLS - Requip  Migraines, f/u [facility] -- off topamax as off 06-2016 MSK: --Back pain, chronic: pain meds rx by ortho, Workers comp MD @ [employer] pain mngmt [place] --DJD,CTS B ([facility]) --Fibromyalgia.  See note from 02/14/2018 [facility], LUTS-- on myrbetriq Dr [place]  Raynaud phenomena---- DX 07/2017  PLAN Fall: As described above, happened at night immediately after she got up from bed.  Unclear if this was a syncope or a mechanical fall.  She did  lose control of her bladder but bladder was full. She was bradycardic at the ER, related?  She is on beta-blockers. She is on multiple medications including painkillers, denies feeling sleepy. History of lower extremity paresthesias, chronic issue.  That may be playing a role in her falls. Ambulatory BPs at home typically normal with occasionally high reading.  Heart rate at home typically in the 60s. + Orthostatics today: BP decrease and heart rate increased with standing  Plan: BMP, CBC Holter monitor to rule out bradycardia. As a trial will cut metoprolol XL 50 mg to half tablet a day, monitor BPs.  Push hydration. Fall prevention discussed including sitting for while before attempt to walk, get nightlights, use a cane HTN: As above History of B12 deficiency: Check labs, not on supplements CHF: Last visit with cardiology 09/05/2019, They noted EF improved to 55 to 60%, she was felt to be stable.  Consider earlier follow-up than planned if needed RTC 1 months      This visit occurred during the SARS-CoV-2 public health emergency.  Safety protocols were  in place, including screening  questions prior to the visit, additional usage of staff PPE, and extensive cleaning of exam room while observing appropriate contact time as indicated for disinfecting solutions.

## 2019-09-19 NOTE — Assessment & Plan Note (Signed)
Fall: As described above, happened at night immediately after she got up from bed.  Unclear if this was a syncope or a mechanical fall.  She did  lose control of her bladder but bladder was full. She was bradycardic at the ER, related?  She is on beta-blockers. She is on multiple medications including painkillers, denies feeling sleepy. History of lower extremity paresthesias, chronic issue.  That may be playing a role in her falls. Ambulatory BPs at home typically normal with occasionally high reading.  Heart rate at home typically in the 60s. + Orthostatics today: BP decrease and heart rate increased with standing  Plan: BMP, CBC Holter monitor to rule out bradycardia. As a trial will cut metoprolol XL 50 mg to half tablet a day, monitor BPs.  Push hydration. Fall prevention discussed including sitting for while before attempt to walk, get nightlights, use a cane HTN: As above History of B12 deficiency: Check labs, not on supplements CHF: Last visit with cardiology 09/05/2019, They noted EF improved to 55 to 60%, she was felt to be stable.  Consider earlier follow-up than planned if needed RTC 1 months

## 2019-09-25 ENCOUNTER — Other Ambulatory Visit: Payer: Self-pay

## 2019-09-25 DIAGNOSIS — R001 Bradycardia, unspecified: Secondary | ICD-10-CM

## 2019-09-28 NOTE — Progress Notes (Signed)
I connected with [name] today by telephone and verified that I am speaking with the correct person using two identifiers. Location patient: home Location provider: work Persons participating in the virtual visit: patient, Marine scientist.    I discussed the limitations, risks, security and privacy concerns of performing an evaluation and management service by telephone and the availability of in person appointments. I also discussed with the patient that there may be a patient responsible charge related to this service. The patient expressed understanding and verbally consented to this telephonic visit.    Interactive audio and video telecommunications were attempted between this provider and patient, however failed, due to patient having technical difficulties OR patient did not have access to video capability.  We continued and completed visit with audio only.  Some vital signs may be absent or patient reported.    Subjective:   [name] is a 74 y.o. female who presents for Medicare Annual (Subsequent) preventive examination.  Review of Systems    Cardiac Risk Factors include: advanced age (>2mn, >>66women);dyslipidemia;hypertension;obesity (BMI >30kg/m2)     Objective:     Advanced Directives 10/01/2019 09/13/2019 09/28/2018 08/07/2018 09/23/2017 05/05/2017 10/25/2016  Does Patient Have a Medical Advance Directive? Yes Yes No No;Yes No Yes No  Type of AParamedicof [place]Living will [place]ocon[place] PinesLiving will - [place] will - [place]-  Does patient want to make changes to medical advance directive? No - Patient declined No - Patient declined - - - No - Patient declined -  Copy of [place] Chart? No - copy requested - - No - copy requested - No - copy requested -  Would patient like information on creating a medical advance directive? - - No - Patient declined - Yes  (MAU/Ambulatory/Procedural Areas - Information given) - No - Patient declined  Pre-existing out of facility DNR order (yellow form or pink MOST form) - - - - - - -    Current Medications (verified) Outpatient Encounter Medications as of 10/01/2019  Medication Sig  . aspirin EC 81 MG tablet Take 81 mg by mouth daily.  . cholecalciferol (VITAMIN D) 1000 units tablet Take 1,000 Units by mouth daily.  .Marland Kitchendicyclomine (BENTYL) 20 MG tablet Take 1 tablet (20 mg total) by mouth 4 (four) times daily -  before meals and at bedtime.  . furosemide (LASIX) 20 MG tablet Take 20 mg by mouth as needed for fluid or edema.  .Marland KitchenHYDROcodone-acetaminophen (NORCO) 10-325 MG tablet Take 1-2 tablets by mouth daily.  .Marland Kitchenloratadine (CLARITIN) 10 MG tablet Take 10 mg by mouth daily as needed for allergies.  .Marland Kitchenmeclizine (ANTIVERT) 12.5 MG tablet Take 1-2 tablets (12.5-25 mg total) by mouth 3 (three) times daily as needed for dizziness.  . metoprolol succinate (TOPROL-XL) 50 MG 24 hr tablet TAKE 1 AND 1/2 TABLET BY MOUTH EVERY DAY  . omeprazole (PRILOSEC) 40 MG capsule Take 1 capsule (40 mg total) by mouth 2 (two) times daily.  . pregabalin (LYRICA) 50 MG capsule Take 50 mg by mouth 3 (three) times daily.  .Marland KitchenPropylene Glycol (SYSTANE BALANCE OP) Place 1 drop into both eyes 2 (two) times daily as needed (DRY EYES).   .Marland KitchenrOPINIRole (REQUIP) 1 MG tablet TAKE 1 TABLET BY MOUTH TWICE A DAY  . sacubitril-valsartan (ENTRESTO) 97-103 MG Take 1 tablet by mouth 2 (two) times daily.  . sertraline (ZOLOFT) 50 MG tablet TAKE 1 AND 1/2  TABLETS BY MOUTH DAILY  . traZODone (DESYREL) 50 MG tablet TAKE 1-2 TABLETS (50-100 MG TOTAL) BY MOUTH AT BEDTIME.  [name] NARCAN 4 MG/0.1ML LIQD nasal spray kit Place 1 spray into the nose once as needed (opioid od).  (Patient not taking: Reported on 10/01/2019)  . nitroGLYCERIN (NITROSTAT) 0.4 MG SL tablet Place 1 tablet (0.4 mg total) under the tongue every 5 (five) minutes as needed for chest pain. (Patient not  taking: Reported on 10/01/2019)   No facility-administered encounter medications on file as of 10/01/2019.    Allergies (verified) Dextromethorphan-guaifenesin, Morphine, Gabapentin, and Penicillins   History: Past Medical History:  Diagnosis Date  . Allergy   . Anemia   . Anxiety   . Barrett's esophagus   . Bilateral carpal tunnel syndrome    Dr. Eddie Dibbles, having injection therapy  . C. difficile diarrhea 08/01/2012   severe 2014  . Carotid arterial disease ([place])   . Cataract    removed both eyes   . Chronic combined systolic and diastolic CHF (congestive heart failure) ([place])   . Chronic cystitis    Dr. Matilde Sprang  . Chronic lower back pain   . Chronic pain syndrome   . Depression   . Dizziness   . DJD (degenerative joint disease)    bilateral hands; knees  . Family history of anesthesia complication    Mother had severe N/V  . Fibromyalgia   . GERD (gastroesophageal reflux disease)   . H/O cardiac catheterization    (-) cath 12-2002  , cath again 2011 (-)  . History of colon polyps   . HTN (hypertension)   . Hyperlipidemia    past hx   . IBS (irritable bowel syndrome)   . Insomnia   . Migraine   . Morbid obesity ([place])   . Non-ischemic cardiomyopathy ([place])   . Osteoporosis    pt unsure of this  . RLS (restless legs syndrome)   . Vertigo   . Vitamin B 12 deficiency 04/09/2013   Past Surgical History:  Procedure Laterality Date  . Arm surgery Left    "don't remember what they did; arm wasn't broken"  . BILATERAL KNEE ARTHROSCOPY Bilateral   . BIOPSY  08/07/2018   Procedure: BIOPSY;  Surgeon: Ladene Artist, MD;  Location: [facility];  Service: Endoscopy;;  . CARDIAC CATHETERIZATION  01/22/10   clean cath  . CATARACT EXTRACTION W/ INTRAOCULAR LENS  IMPLANT, BILATERAL Bilateral   . CHOLECYSTECTOMY N/A 10/25/2016   Procedure: LAPAROSCOPIC CHOLECYSTECTOMY;  Surgeon: Georganna Skeans, MD;  Location: [place];  Service: General;  Laterality: N/A;  . COLONOSCOPY    .  COLONOSCOPY W/ POLYPECTOMY    . ESOPHAGOGASTRODUODENOSCOPY (EGD) WITH PROPOFOL N/A 08/07/2018   Procedure: ESOPHAGOGASTRODUODENOSCOPY (EGD) WITH PROPOFOL;  Surgeon: Ladene Artist, MD;  Location: [facility];  Service: Endoscopy;  Laterality: N/A;  . FOOT SURGERY Bilateral    toenails removed; callus removed on right; hammertoes right"  . Knot     "removed from right neck; not a goiter"  . LUMBAR DISC SURGERY     L5 S1 anterior fusion  . OOPHORECTOMY    . RIGHT/LEFT HEART CATH AND CORONARY ANGIOGRAPHY N/A 05/06/2017   Procedure: RIGHT/LEFT HEART CATH AND CORONARY ANGIOGRAPHY;  Surgeon: Nelva Bush, MD;  Location: [facility];  Service: Cardiovascular;  Laterality: N/A;  . SHOULDER ARTHROSCOPY Right   . TOTAL KNEE ARTHROPLASTY  10/05/2011   Procedure: TOTAL KNEE ARTHROPLASTY;  Surgeon: Hessie Dibble, MD;  Location: [facility];  Service: Orthopedics;  Laterality: Right;  . UPPER GASTROINTESTINAL ENDOSCOPY    . VAGINAL HYSTERECTOMY     for endometriosis   Family History  Problem Relation Age of Onset  . Lung cancer Mother   . Dementia Father   . CAD Father        dx in his 62s  . Stroke Father   . Congestive Heart Failure Father   . Colon cancer Maternal Grandfather   . Esophageal cancer Brother   . Breast cancer Sister   . Diabetes Other        Aunt  . Colon cancer Maternal Uncle        2  . Hepatitis C Brother   . Rectal cancer Neg Hx   . Stomach cancer Neg Hx   . Colon polyps Neg Hx    Social History   Socioeconomic History  . Marital status: Divorced    Spouse name: Not on file  . Number of children: 2  . Years of education: 59  . Highest education level: High school graduate  Occupational History  . Occupation: Retired, post Ecologist: RETIRED  Tobacco Use  . Smoking status: Former Smoker    Packs/day: 0.50    Years: 10.00    Pack years: 5.00    Quit date: 02/09/1975    Years since quitting: 44.6  . Smokeless tobacco: Never Used  . Tobacco  comment: started at age 5.   quit in the 65s.  Vaping Use  . Vaping Use: Never used  Substance and Sexual Activity  . Alcohol use: Not Currently    Alcohol/week: 0.0 standard drinks    Comment: 2 glasses of wine per year  . Drug use: No    Comment: CBD and hemp OIL   . Sexual activity: Not Currently    Partners: Male  Other Topics Concern  . Not on file  Social History Narrative   Son lives with her.   Right-handed.   2 soft drinks per day.   Social Determinants of Health   Financial Resource Strain: Low Risk   . Difficulty of Paying Living Expenses: Not hard at all  Food Insecurity: No Food Insecurity  . Worried About Charity fundraiser in the Last Year: Never true  . Ran Out of Food in the Last Year: Never true  Transportation Needs: No Transportation Needs  . Lack of Transportation (Medical): No  . Lack of Transportation (Non-Medical): No  Physical Activity:   . Days of Exercise per Week: Not on file  . Minutes of Exercise per Session: Not on file  Stress:   . Feeling of Stress : Not on file  Social Connections:   . Frequency of Communication with Friends and Family: Not on file  . Frequency of Social Gatherings with Friends and Family: Not on file  . Attends Religious Services: Not on file  . Active Member of Clubs or Organizations: Not on file  . Attends Archivist Meetings: Not on file  . Marital Status: Not on file    Tobacco Counseling Counseling given: Not Answered Comment: started at age 23.   quit in the 1970s.   Clinical Intake: Pain : No/denies pain    Activities of Daily Living In your present state of health, do you have any difficulty performing the following activities: 10/01/2019  Hearing? N  Vision? N  Difficulty concentrating or making decisions? N  Walking or climbing stairs? N  Dressing or bathing?  N  Doing errands, shopping? N  Preparing Food and eating ? N  Using the Toilet? N  In the past six months, have you  accidently leaked urine? N  Do you have problems with loss of bowel control? N  Managing your Medications? N  Managing your Finances? N  Housekeeping or managing your Housekeeping? N  Some recent data might be hidden    Patient Care Team: Colon Branch, MD as PCP - General Jerline Pain, MD as PCP - Cardiology (Cardiology) Bjorn Loser, MD as Consulting Physician (Urology) Maia Breslow, MD as Consulting Physician (Orthopedic Surgery) Alden Hipp, MD as Consulting Physician (Obstetrics and Gynecology) Phylliss Bob, MD as Consulting Physician (Orthopedic Surgery) Red Christians, MD as Referring Physician (Pain Medicine)  Indicate any recent Medical Services you may have received from other than [facility] providers in the past year (date may be approximate).     Assessment:   This is a routine wellness examination for [facility].  Dietary issues and exercise activities discussed: Current Exercise Habits: The patient does not participate in regular exercise at present, Exercise limited by: None identified Diet (meal preparation, eat out, water intake, caffeinated beverages, dairy products, fruits and vegetables): on average, 1-2 meals per day  Goals    . DIET - EAT MORE FRUITS AND VEGETABLES      Depression Screen PHQ 2/9 Scores 07/04/2019 10/31/2018 09/28/2018 02/14/2018 09/23/2017 09/15/2016 09/15/2015  PHQ - 2 Score _0 0 0 0  PHQ- 9 Score 12 10 - 11 - - -  Exception Documentation - - - - - - -    Fall Risk Fall Risk  10/01/2019 07/04/2019 09/28/2018 09/23/2017 09/15/2016  Falls in the past year? 1 0 1 Yes Yes  Number falls in past yr: 0 0 1 2 or more 2 or more  Injury with Fall? 0 0 1 No Yes  Comment - - - - -  Risk for fall due to : - - - Impaired balance/gait;Medication side effect;History of fall(s) -  Follow up Education provided;Falls prevention discussed Falls evaluation completed - Education provided Education provided;Falls prevention discussed    Any stairs in or around the  home? No  If so, are there any without handrails? No  Home free of loose throw rugs in walkways, pet beds, electrical cords, etc? Yes  Adequate lighting in your home to reduce risk of falls? Yes   ASSISTIVE DEVICES UTILIZED TO PREVENT FALLS:  Life alert? No  Use of a cane, walker or w/c? Yes  Grab bars in the bathroom? No  Shower chair or bench in shower? Yes  Elevated toilet seat or a handicapped toilet? No     Cognitive Function: Ad8 score reviewed for issues:  Issues making decisions:no  Less interest in hobbies / activities:no  Repeats questions, stories (family complaining):no  Trouble using ordinary gadgets (microwave, computer, phone):no  Forgets the month or year: no  Mismanaging finances: no  Remembering appts:no  Daily problems with thinking and/or memory:no Ad8 score is=0     MMSE - Mini Mental State Exam 09/23/2017 09/15/2016  Orientation to time 5 5  Orientation to Place 5 5  Registration 3 3  Attention/ Calculation 5 3  Recall 3 3  Language- name 2 objects 2 2  Language- repeat 1 1  Language- follow 3 step command 3 3  Language- read & follow direction 1 1  Write a sentence 1 1  Copy design 1 1  Total score 30 28  Immunizations Immunization History  Administered Date(s) Administered  . Fluad Quad(high Dose 65+) 10/31/2018  . Influenza Split 02/17/2011  . Influenza Whole 01/14/2010  . Influenza, High Dose Seasonal PF 01/24/2015, 12/16/2017  . Influenza,inj,Quad PF,6+ Mos 10/22/2013  . Influenza-Unspecified 03/13/2016, 10/20/2016  . Pneumococcal Conjugate-13 09/10/2013  . Pneumococcal Polysaccharide-23 04/14/2010  . Td 02/08/1998, 04/14/2010  . Zoster 12/06/2013    TDAP status: Up to date Flu Vaccine status: Up to date Pneumococcal vaccine status: Up to date Covid-19 vaccine status: Information provided on how to obtain vaccines.   Qualifies for Shingles Vaccine? Yes   Zostavax completed Yes     Screening Tests Health  Maintenance  Topic Date Due  . COVID-19 Vaccine (1) Never done  . INFLUENZA VACCINE  12/26/2019 (Originally 09/09/2019)  . MAMMOGRAM  02/15/2020  . TETANUS/TDAP  04/13/2020  . COLONOSCOPY  12/17/2024  . DEXA SCAN  Completed  . Hepatitis C Screening  Completed  . PNA vac Low Risk Adult  Completed    Health Maintenance  Health Maintenance Due  Topic Date Due  . COVID-19 Vaccine (1) Never done    Colorectal cancer screening: Completed 12/18/14. Repeat every (not on file) years Mammogram status: Completed 02/15/19. Repeat every year Bone Density status: Completed 02/15/19. Results reflect: Bone density results: OSTEOPENIA. Repeat every 2 years.  Lung Cancer Screening: (Low Dose CT Chest recommended if Age 78-80 years, 30 pack-year currently smoking OR have quit w/in 15years.) does not qualify.   Lung Cancer Screening Referral: na   Additional Screening:  Hepatitis C Screening: does qualify; Completed 06/14/16   Vision Screening: Recommended annual ophthalmology exams for early detection of glaucoma and other disorders of the eye. Is the patient up to date with their annual eye exam?  Yes  per pt Who is the provider or what is the name of the office in which the patient attends annual eye exams? [facility].    Dental Screening: Recommended annual dental exams for proper oral hygiene  Community Resource Referral / Chronic Care Management: CRR required this visit?  No   CCM required this visit?  No      Plan:   Continue to eat heart healthy diet (full of fruits, vegetables, whole grains, lean protein, water--limit salt, fat, and sugar intake) and increase physical activity as tolerated.  Continue doing brain stimulating activities (puzzles, reading, adult coloring books, staying active) to keep memory sharp.   Bring a copy of your living will and/or healthcare power of attorney to your next office visit.   I have personally reviewed and noted the following in the patient's  chart:   . Medical and social history . Use of alcohol, tobacco or illicit drugs  . Current medications and supplements . Functional ability and status . Nutritional status . Physical activity . Advanced directives . List of other physicians . Hospitalizations, surgeries, and ER visits in previous 12 months . Vitals . Screenings to include cognitive, depression, and falls . Referrals and appointments  In addition, I have reviewed and discussed with patient certain preventive protocols, quality metrics, and best practice recommendations. A written personalized care plan for preventive services as well as general preventive health recommendations were provided to patient.   Due to this being a telephonic visit, the after visit summary with patients personalized plan was offered to patient via mail or my-chart.  Patient would like to access on my-chart.   Naaman Plummer [place], [place]   10/01/2019

## 2019-10-01 ENCOUNTER — Ambulatory Visit (INDEPENDENT_AMBULATORY_CARE_PROVIDER_SITE_OTHER): Payer: Medicare Other | Admitting: *Deleted

## 2019-10-01 ENCOUNTER — Other Ambulatory Visit: Payer: Self-pay

## 2019-10-01 ENCOUNTER — Encounter: Payer: Self-pay | Admitting: *Deleted

## 2019-10-01 DIAGNOSIS — Z0000 Encounter for general adult medical examination without abnormal findings: Secondary | ICD-10-CM

## 2019-10-01 NOTE — Patient Instructions (Signed)
Continue to eat heart healthy diet (full of fruits, vegetables, whole grains, lean protein, water--limit salt, fat, and sugar intake) and increase physical activity as tolerated.  Continue doing brain stimulating activities (puzzles, reading, adult coloring books, staying active) to keep memory sharp.   Bring a copy of your living will and/or healthcare power of attorney to your next office visit.   [name] , Thank you for taking time to come for your Medicare Wellness Visit. I appreciate your ongoing commitment to your health goals. Please review the following plan we discussed and let me know if I can assist you in the future.   These are the goals we discussed: Goals    . DIET - EAT MORE FRUITS AND VEGETABLES       This is a list of the screening recommended for you and due dates:  Health Maintenance  Topic Date Due  . COVID-19 Vaccine (1) Never done  . Flu Shot  12/26/2019*  . Mammogram  02/15/2020  . Tetanus Vaccine  04/13/2020  . Colon Cancer Screening  12/17/2024  . DEXA scan (bone density measurement)  Completed  .  Hepatitis C: One time screening is recommended by Center for Disease Control  (CDC) for  adults born from [birth year] through 1966.   Completed  . Pneumonia vaccines  Completed  *Topic was postponed. The date shown is not the original due date.    Preventive Care 75 Years and Older, Female Preventive care refers to lifestyle choices and visits with your health care provider that can promote health and wellness. This includes:  A yearly physical exam. This is also called an annual well check.  Regular dental and eye exams.  Immunizations.  Screening for certain conditions.  Healthy lifestyle choices, such as diet and exercise. What can I expect for my preventive care visit? Physical exam Your health care provider will check:  Height and weight. These may be used to calculate body mass index (BMI), which is a measurement that tells if you are at a healthy  weight.  Heart rate and blood pressure.  Your skin for abnormal spots. Counseling Your health care provider may ask you questions about:  Alcohol, tobacco, and drug use.  Emotional well-being.  Home and relationship well-being.  Sexual activity.  Eating habits.  History of falls.  Memory and ability to understand (cognition).  Work and work Astronomer.  Pregnancy and menstrual history. What immunizations do I need?  Influenza (flu) vaccine  This is recommended every year. Tetanus, diphtheria, and pertussis (Tdap) vaccine  You may need a Td booster every 10 years. Varicella (chickenpox) vaccine  You may need this vaccine if you have not already been vaccinated. Zoster (shingles) vaccine  You may need this after age 26. Pneumococcal conjugate (PCV13) vaccine  One dose is recommended after age 60. Pneumococcal polysaccharide (PPSV23) vaccine  One dose is recommended after age 8. Measles, mumps, and rubella (MMR) vaccine  You may need at least one dose of MMR if you were born in 1957 or later. You may also need a second dose. Meningococcal conjugate (MenACWY) vaccine  You may need this if you have certain conditions. Hepatitis A vaccine  You may need this if you have certain conditions or if you travel or work in places where you may be exposed to hepatitis A. Hepatitis B vaccine  You may need this if you have certain conditions or if you travel or work in places where you may be exposed to hepatitis B. Haemophilus  influenzae type b (Hib) vaccine  You may need this if you have certain conditions. You may receive vaccines as individual doses or as more than one vaccine together in one shot (combination vaccines). Talk with your health care provider about the risks and benefits of combination vaccines. What tests do I need? Blood tests  Lipid and cholesterol levels. These may be checked every 5 years, or more frequently depending on your overall  health.  Hepatitis C test.  Hepatitis B test. Screening  Lung cancer screening. You may have this screening every year starting at age 68 if you have a 30-pack-year history of smoking and currently smoke or have quit within the past 15 years.  Colorectal cancer screening. All adults should have this screening starting at age 73 and continuing until age 41. Your health care provider may recommend screening at age 75 if you are at increased risk. You will have tests every 1-10 years, depending on your results and the type of screening test.  Diabetes screening. This is done by checking your blood sugar (glucose) after you have not eaten for a while (fasting). You may have this done every 1-3 years.  Mammogram. This may be done every 1-2 years. Talk with your health care provider about how often you should have regular mammograms.  BRCA-related cancer screening. This may be done if you have a family history of breast, ovarian, tubal, or peritoneal cancers. Other tests  Sexually transmitted disease (STD) testing.  Bone density scan. This is done to screen for osteoporosis. You may have this done starting at age 24. Follow these instructions at home: Eating and drinking  Eat a diet that includes fresh fruits and vegetables, whole grains, lean protein, and low-fat dairy products. Limit your intake of foods with high amounts of sugar, saturated fats, and salt.  Take vitamin and mineral supplements as recommended by your health care provider.  Do not drink alcohol if your health care provider tells you not to drink.  If you drink alcohol: ? Limit how much you have to 0-1 drink a day. ? Be aware of how much alcohol is in your drink. In the [place] one drink equals one 12 oz bottle of beer (355 mL), one 5 oz glass of wine (148 mL), or one 1 oz glass of hard liquor (44 mL). Lifestyle  Take daily care of your teeth and gums.  Stay active. Exercise for at least 30 minutes on 5 or more days  each week.  Do not use any products that contain nicotine or tobacco, such as cigarettes, e-cigarettes, and chewing tobacco. If you need help quitting, ask your health care provider.  If you are sexually active, practice safe sex. Use a condom or other form of protection in order to prevent STIs (sexually transmitted infections).  Talk with your health care provider about taking a low-dose aspirin or statin. What's next?  Go to your health care provider once a year for a well check visit.  Ask your health care provider how often you should have your eyes and teeth checked.  Stay up to date on all vaccines. This information is not intended to replace advice given to you by your health care provider. Make sure you discuss any questions you have with your health care provider. Document Revised: 01/19/2018 Document Reviewed: 01/19/2018 Elsevier Patient Education  2020 [employer].

## 2019-10-18 ENCOUNTER — Ambulatory Visit: Payer: Medicare Other | Admitting: Internal Medicine

## 2019-10-28 ENCOUNTER — Other Ambulatory Visit: Payer: Self-pay | Admitting: Family Medicine

## 2019-10-29 ENCOUNTER — Other Ambulatory Visit: Payer: Self-pay

## 2019-10-29 ENCOUNTER — Other Ambulatory Visit: Payer: Self-pay | Admitting: Family Medicine

## 2019-10-29 ENCOUNTER — Telehealth (INDEPENDENT_AMBULATORY_CARE_PROVIDER_SITE_OTHER): Payer: Medicare Other | Admitting: Internal Medicine

## 2019-10-29 ENCOUNTER — Encounter: Payer: Self-pay | Admitting: Internal Medicine

## 2019-10-29 VITALS — BP 168/60 | HR 65 | Ht 66.0 in | Wt 233.0 lb

## 2019-10-29 DIAGNOSIS — G4700 Insomnia, unspecified: Secondary | ICD-10-CM | POA: Diagnosis not present

## 2019-10-29 DIAGNOSIS — F32A Depression, unspecified: Secondary | ICD-10-CM

## 2019-10-29 DIAGNOSIS — F329 Major depressive disorder, single episode, unspecified: Secondary | ICD-10-CM

## 2019-10-29 DIAGNOSIS — I10 Essential (primary) hypertension: Secondary | ICD-10-CM

## 2019-10-29 DIAGNOSIS — F419 Anxiety disorder, unspecified: Secondary | ICD-10-CM

## 2019-10-29 NOTE — Progress Notes (Signed)
Subjective:    Patient ID: [name], female    DOB: July 17, 1945, 74 y.o.   MRN: [id number]  DOS:  10/29/2019 Type of visit - description: Virtual Visit via Video Note  I connected with the above patient  by a video enabled telemedicine application and verified that I am speaking with the correct person using two identifiers.   THIS ENCOUNTER IS A VIRTUAL VISIT DUE TO COVID-19 - PATIENT WAS NOT SEEN IN THE OFFICE. PATIENT HAS CONSENTED TO VIRTUAL VISIT / TELEMEDICINE VISIT   Location of patient: home  Location of provider: office  Persons participating in the virtual visit: patient, provider   I discussed the limitations of evaluation and management by telemedicine and the availability of in person appointments. The patient expressed understanding and agreed to proceed.  Follow-up Follow-up from last visit, she was seen after a fall, beta-blockers were adjusted.    Review of Systems No further falls. Very seldom has any dizziness. Denies chest pain, difficulty breathing. Currently not taking Lasix, denies edema. Ambulatory BPs "all over the place"  Past Medical History:  Diagnosis Date  . Allergy   . Anemia   . Anxiety   . Barrett's esophagus   . Bilateral carpal tunnel syndrome    Dr. Eddie Dibbles, having injection therapy  . C. difficile diarrhea 08/01/2012   severe 2014  . Carotid arterial disease ([place])   . Cataract    removed both eyes   . Chronic combined systolic and diastolic CHF (congestive heart failure) ([place])   . Chronic cystitis    Dr. Matilde Sprang  . Chronic lower back pain   . Chronic pain syndrome   . Depression   . Dizziness   . DJD (degenerative joint disease)    bilateral hands; knees  . Family history of anesthesia complication    Mother had severe N/V  . Fibromyalgia   . GERD (gastroesophageal reflux disease)   . H/O cardiac catheterization    (-) cath 12-2002  , cath again 2011 (-)  . History of colon polyps   . HTN (hypertension)   .  Hyperlipidemia    past hx   . IBS (irritable bowel syndrome)   . Insomnia   . Migraine   . Morbid obesity ([place])   . Non-ischemic cardiomyopathy ([place])   . Osteoporosis    pt unsure of this  . RLS (restless legs syndrome)   . Vertigo   . Vitamin B 12 deficiency 04/09/2013    Past Surgical History:  Procedure Laterality Date  . Arm surgery Left    "don't remember what they did; arm wasn't broken"  . BILATERAL KNEE ARTHROSCOPY Bilateral   . BIOPSY  08/07/2018   Procedure: BIOPSY;  Surgeon: Ladene Artist, MD;  Location: [facility];  Service: Endoscopy;;  . CARDIAC CATHETERIZATION  01/22/10   clean cath  . CATARACT EXTRACTION W/ INTRAOCULAR LENS  IMPLANT, BILATERAL Bilateral   . CHOLECYSTECTOMY N/A 10/25/2016   Procedure: LAPAROSCOPIC CHOLECYSTECTOMY;  Surgeon: Georganna Skeans, MD;  Location: [facility];  Service: General;  Laterality: N/A;  . COLONOSCOPY    . COLONOSCOPY W/ POLYPECTOMY    . ESOPHAGOGASTRODUODENOSCOPY (EGD) WITH PROPOFOL N/A 08/07/2018   Procedure: ESOPHAGOGASTRODUODENOSCOPY (EGD) WITH PROPOFOL;  Surgeon: Ladene Artist, MD;  Location: [facility];  Service: Endoscopy;  Laterality: N/A;  . FOOT SURGERY Bilateral    toenails removed; callus removed on right; hammertoes right"  . Knot     "removed from right neck; not a goiter"  .  LUMBAR DISC SURGERY     L5 S1 anterior fusion  . OOPHORECTOMY    . RIGHT/LEFT HEART CATH AND CORONARY ANGIOGRAPHY N/A 05/06/2017   Procedure: RIGHT/LEFT HEART CATH AND CORONARY ANGIOGRAPHY;  Surgeon: Nelva Bush, MD;  Location: [facility];  Service: Cardiovascular;  Laterality: N/A;  . SHOULDER ARTHROSCOPY Right   . TOTAL KNEE ARTHROPLASTY  10/05/2011   Procedure: TOTAL KNEE ARTHROPLASTY;  Surgeon: Hessie Dibble, MD;  Location: [facility];  Service: Orthopedics;  Laterality: Right;  . UPPER GASTROINTESTINAL ENDOSCOPY    . VAGINAL HYSTERECTOMY     for endometriosis    Allergies as of 10/29/2019      Reactions    Dextromethorphan-guaifenesin Nausea And Vomiting   Morphine Nausea And Vomiting   Gabapentin Other (See Comments)   Dizziness, made her feel loopy   Penicillins Rash   "> 30 years ago; best I can remember it was just a light rash on my arm" Did it involve swelling of the face/tongue/throat, SOB, or low BP? No Did it involve sudden or severe rash/hives, skin peeling, or any reaction on the inside of your mouth or nose? Unknown Did you need to seek medical attention at a hospital or doctor's office? No When did it last happen?more than 30 years  If all above answers are "NO", may proceed with cephalosporin use.      Medication List       Accurate as of October 29, 2019  2:38 PM. If you have any questions, ask your nurse or doctor.        aspirin EC 81 MG tablet Take 81 mg by mouth daily.   cholecalciferol 1000 units tablet Commonly known as: VITAMIN D Take 1,000 Units by mouth daily.   dicyclomine 20 MG tablet Commonly known as: BENTYL Take 1 tablet (20 mg total) by mouth 4 (four) times daily -  before meals and at bedtime.   Entresto 97-103 MG Generic drug: sacubitril-valsartan Take 1 tablet by mouth 2 (two) times daily.   furosemide 20 MG tablet Commonly known as: LASIX Take 20 mg by mouth as needed for fluid or edema.   HYDROcodone-acetaminophen 10-325 MG tablet Commonly known as: NORCO Take 1-2 tablets by mouth daily.   loratadine 10 MG tablet Commonly known as: CLARITIN Take 10 mg by mouth daily as needed for allergies.   meclizine 12.5 MG tablet Commonly known as: ANTIVERT Take 1-2 tablets (12.5-25 mg total) by mouth 3 (three) times daily as needed for dizziness.   metoprolol succinate 50 MG 24 hr tablet Commonly known as: TOPROL-XL TAKE 1 AND 1/2 TABLET BY MOUTH EVERY DAY   Narcan 4 MG/0.1ML Liqd nasal spray kit Generic drug: naloxone Place 1 spray into the nose once as needed (opioid od).   nitroGLYCERIN 0.4 MG SL tablet Commonly known as:  NITROSTAT Place 1 tablet (0.4 mg total) under the tongue every 5 (five) minutes as needed for chest pain.   omeprazole 40 MG capsule Commonly known as: PRILOSEC Take 1 capsule (40 mg total) by mouth 2 (two) times daily.   pregabalin 50 MG capsule Commonly known as: LYRICA Take 50 mg by mouth 3 (three) times daily.   rOPINIRole 1 MG tablet Commonly known as: REQUIP TAKE 1 TABLET BY MOUTH TWICE A DAY   sertraline 50 MG tablet Commonly known as: ZOLOFT TAKE 1 AND 1/2 TABLETS DAILY BY MOUTH   SYSTANE BALANCE OP Place 1 drop into both eyes 2 (two) times daily as needed (DRY EYES).  traZODone 50 MG tablet Commonly known as: DESYREL TAKE 1-2 TABLETS (50-100 MG TOTAL) BY MOUTH AT BEDTIME.          Objective:   Physical Exam BP (!) 168/60   Pulse 65   Ht '5\' 6"'  (1.676 m)   Wt 233 lb (105.7 kg)   BMI 37.61 kg/m  This is a virtual video visit, she is alert oriented x3, in no apparent distress    Assessment      Assessment HTN Hyperlipidemia-  Pravachol, Crestor: Myalgias. Intolerant to Zetia (joint aches), see OV  06/2016  Depression, Anxiety (on clonazepam), insomnia (on trazodone):  depression x many  years, remotely on zoloft and other meds per psych, I rx lexapro 2015, then was rx effexor x a while Morbid obesity CV: CHF/ non ischemic cardiomyopathy dx 04-2017; cath normal coronaries, EF ~ 30% Carotid artery disease: 1 to 39% bilateral carotids 09-2016, start aspirin per neurology GI:  --GERD, Barrett's esophagus, had a EGD 07/2018  --h/o persistent C. difficile diarrhea 2014 B12 deficiency Osteopenia: Dexa 2015 showed osteopenia, T score -1.9 (10/2016): Rx cs, vit D PULM: --NPSG 2008:  No osa, PLMS 4/hr with arousal and awakening --RLS - Requip  Migraines, f/u [facility] -- off topamax as off 06-2016 MSK: --Back pain, chronic: pain meds rx by ortho, Workers comp MD @ [employer] pain mngmt [place] --DJD,CTS B ([facility]) --Fibromyalgia.  See note from  02/14/2018 [facility], LUTS-- on myrbetriq Dr McDarmoth  Raynaud phenomena---- DX 07/2017  PLAN Fall: See last visit, no further episodes. HTN: Due to concerns of bradycardia, metoprolol dose decreased to 50 mg daily.  Ambulatory BPs are "all over the place" from 150-175.  Heart rate today in the 60s. Plan: Continue metoprolol XL 50 mg daily, Also Entresto.  Recommend to restart Lasix 20 mg daily (she was taking it as needed).  Will ask cardiology to check a BMP for the patient since she will be seen in 10 days.  If she is not at goal she will let me know, message with instructions sent. Anxiety, depression, insomnia: on sertraline, trazodone.  She also takes hydrocodone and has Narcan.  I advised not to change insomnia medicines due to risk of oversedation. RTC 4 months   I discussed the assessment and treatment plan with the patient. The patient was provided an opportunity to ask questions and all were answered. The patient agreed with the plan and demonstrated an understanding of the instructions.   The patient was advised to call back or seek an in-person evaluation if the symptoms worsen or if the condition fails to improve as anticipated.

## 2019-10-29 NOTE — Progress Notes (Signed)
Pre visit review using our clinic review tool, if applicable. No additional management support is needed unless otherwise documented below in the visit note. 

## 2019-10-30 ENCOUNTER — Encounter: Payer: Self-pay | Admitting: Internal Medicine

## 2019-10-30 NOTE — Assessment & Plan Note (Addendum)
Fall: See last visit, no further episodes. HTN: Due to concerns of bradycardia, metoprolol dose decreased to 50 mg daily.  Ambulatory BPs are "all over the place" from 150-175.  Heart rate today in the 60s. Plan: Continue metoprolol XL 50 mg daily, Also Entresto.  Recommend to restart Lasix 20 mg daily (she was taking it as needed).  Will ask cardiology to check a BMP for the patient since she will be seen in 10 days.  If she is not at goal she will let me know, message with instructions sent. Anxiety, depression, insomnia: on sertraline, trazodone.  She also takes hydrocodone and has Narcan.  I advised not to change insomnia medicines due to risk of oversedation. RTC 4 months

## 2019-10-31 ENCOUNTER — Telehealth: Payer: Self-pay | Admitting: Internal Medicine

## 2019-10-31 NOTE — Telephone Encounter (Signed)
lvm to schedule appt.  

## 2019-10-31 NOTE — Telephone Encounter (Signed)
-----   Message from Colon Branch, MD sent at 10/30/2019  7:12 PM EDT ----- Regarding: Please schedule a routine office visit in 4 months

## 2019-11-05 ENCOUNTER — Ambulatory Visit: Payer: Medicare Other | Admitting: Internal Medicine

## 2019-11-08 ENCOUNTER — Ambulatory Visit: Payer: Medicare Other | Admitting: Cardiology

## 2019-11-10 ENCOUNTER — Telehealth (INDEPENDENT_AMBULATORY_CARE_PROVIDER_SITE_OTHER): Payer: Medicare Other | Admitting: Family Medicine

## 2019-11-10 DIAGNOSIS — I10 Essential (primary) hypertension: Secondary | ICD-10-CM

## 2019-11-10 DIAGNOSIS — U071 COVID-19: Secondary | ICD-10-CM | POA: Diagnosis not present

## 2019-11-10 DIAGNOSIS — R059 Cough, unspecified: Secondary | ICD-10-CM | POA: Diagnosis not present

## 2019-11-10 LAB — NOVEL CORONAVIRUS, NAA: SARS-CoV-2, NAA: DETECTED

## 2019-11-10 MED ORDER — BENZONATATE 100 MG PO CAPS: 100.0000 mg | ORAL_CAPSULE | Freq: Three times a day (TID) | ORAL | 1 refills | Status: DC | PRN

## 2019-11-10 MED ORDER — AZITHROMYCIN 250 MG PO TABS: ORAL_TABLET | ORAL | 0 refills | Status: DC

## 2019-11-10 NOTE — Assessment & Plan Note (Signed)
Patient has been sick for about 2 days. With headache, sinus pressure, rhinorrhea, PND, dry cough that is keeping her up and making her chest hurt with coughing. She also notes her hair and teeth hurt. She has been lying in bed. She is unvaccinated. She agrees her son will take her to get tested today and then she will let us know the results so we can set her up with infusion. She is sent in [place] and [place] and have asked her to get a pulse oximeter to keep an eye on her oxygen levels. If in the 90s we can wait on test results. If in the 80s then needs to seek care.

## 2019-11-10 NOTE — Assessment & Plan Note (Signed)
Monitor and report concerns.

## 2019-11-10 NOTE — Progress Notes (Signed)
Virtual Visit via Video Note  I connected with [name] on 11/10/19 at  9:00 AM EDT by a video enabled telemedicine application and verified that I am speaking with the correct person using two identifiers.  Location: Patient: home, patient and provider in visit Provider: office   I discussed the limitations of evaluation and management by telemedicine and the availability of in person appointments. The patient expressed understanding and agreed to proceed. [name], CMA was able to get the patient set up in a video visit   Subjective:    Patient ID: [name], female    DOB: 03-Nov-1945, 74 y.o.   MRN: [id number]  Chief Complaint  Patient presents with  . sinus pressure    Day 3    HPI Patient is in today for cough. Patient has been sick for about 2 days. With headache, sinus pressure, rhinorrhea, PND, dry cough that is keeping her up and making her chest hurt with coughing. She also notes her hair and teeth hurt. She has been lying in bed. She is unvaccinated.  Past Medical History:  Diagnosis Date  . Allergy   . Anemia   . Anxiety   . Barrett's esophagus   . Bilateral carpal tunnel syndrome    Dr. Eddie Dibbles, having injection therapy  . C. difficile diarrhea 08/01/2012   severe 2014  . Carotid arterial disease ([place])   . Cataract    removed both eyes   . Chronic combined systolic and diastolic CHF (congestive heart failure) ([place])   . Chronic cystitis    Dr. Matilde Sprang  . Chronic lower back pain   . Chronic pain syndrome   . Depression   . Dizziness   . DJD (degenerative joint disease)    bilateral hands; knees  . Family history of anesthesia complication    Mother had severe N/V  . Fibromyalgia   . GERD (gastroesophageal reflux disease)   . H/O cardiac catheterization    (-) cath 12-2002  , cath again 2011 (-)  . History of colon polyps   . HTN (hypertension)   . Hyperlipidemia    past hx   . IBS (irritable bowel syndrome)   . Insomnia   . Migraine   .  Morbid obesity ([place])   . Non-ischemic cardiomyopathy ([place])   . Osteoporosis    pt unsure of this  . RLS (restless legs syndrome)   . Vertigo   . Vitamin B 12 deficiency 04/09/2013    Past Surgical History:  Procedure Laterality Date  . Arm surgery Left    "don't remember what they did; arm wasn't broken"  . BILATERAL KNEE ARTHROSCOPY Bilateral   . BIOPSY  08/07/2018   Procedure: BIOPSY;  Surgeon: Ladene Artist, MD;  Location: [facility];  Service: Endoscopy;;  . CARDIAC CATHETERIZATION  01/22/10   clean cath  . CATARACT EXTRACTION W/ INTRAOCULAR LENS  IMPLANT, BILATERAL Bilateral   . CHOLECYSTECTOMY N/A 10/25/2016   Procedure: LAPAROSCOPIC CHOLECYSTECTOMY;  Surgeon: Georganna Skeans, MD;  Location: [facility];  Service: General;  Laterality: N/A;  . COLONOSCOPY    . COLONOSCOPY W/ POLYPECTOMY    . ESOPHAGOGASTRODUODENOSCOPY (EGD) WITH PROPOFOL N/A 08/07/2018   Procedure: ESOPHAGOGASTRODUODENOSCOPY (EGD) WITH PROPOFOL;  Surgeon: Ladene Artist, MD;  Location: [facility];  Service: Endoscopy;  Laterality: N/A;  . FOOT SURGERY Bilateral    toenails removed; callus removed on right; hammertoes right"  . Knot     "removed from right neck; not a goiter"  .  LUMBAR DISC SURGERY     L5 S1 anterior fusion  . OOPHORECTOMY    . RIGHT/LEFT HEART CATH AND CORONARY ANGIOGRAPHY N/A 05/06/2017   Procedure: RIGHT/LEFT HEART CATH AND CORONARY ANGIOGRAPHY;  Surgeon: Nelva Bush, MD;  Location: [facility];  Service: Cardiovascular;  Laterality: N/A;  . SHOULDER ARTHROSCOPY Right   . TOTAL KNEE ARTHROPLASTY  10/05/2011   Procedure: TOTAL KNEE ARTHROPLASTY;  Surgeon: Hessie Dibble, MD;  Location: [facility];  Service: Orthopedics;  Laterality: Right;  . UPPER GASTROINTESTINAL ENDOSCOPY    . VAGINAL HYSTERECTOMY     for endometriosis    Family History  Problem Relation Age of Onset  . Lung cancer Mother   . Dementia Father   . CAD Father        dx in his 78s  . Stroke Father   .  Congestive Heart Failure Father   . Colon cancer Maternal Grandfather   . Esophageal cancer Brother   . Breast cancer Sister   . Diabetes Other        Aunt  . Colon cancer Maternal Uncle        2  . Hepatitis C Brother   . Rectal cancer Neg Hx   . Stomach cancer Neg Hx   . Colon polyps Neg Hx     Social History   Socioeconomic History  . Marital status: Divorced    Spouse name: Not on file  . Number of children: 2  . Years of education: 71  . Highest education level: High school graduate  Occupational History  . Occupation: Retired, post Ecologist: RETIRED  Tobacco Use  . Smoking status: Former Smoker    Packs/day: 0.50    Years: 10.00    Pack years: 5.00    Quit date: 02/09/1975    Years since quitting: 44.7  . Smokeless tobacco: Never Used  . Tobacco comment: started at age 73.   quit in the 54s.  Vaping Use  . Vaping Use: Never used  Substance and Sexual Activity  . Alcohol use: Not Currently    Alcohol/week: 0.0 standard drinks    Comment: 2 glasses of wine per year  . Drug use: No    Comment: CBD and hemp OIL   . Sexual activity: Not Currently    Partners: Male  Other Topics Concern  . Not on file  Social History Narrative   Son lives with her.   Right-handed.   2 soft drinks per day.   Social Determinants of Health   Financial Resource Strain: Low Risk   . Difficulty of Paying Living Expenses: Not hard at all  Food Insecurity: No Food Insecurity  . Worried About Charity fundraiser in the Last Year: Never true  . Ran Out of Food in the Last Year: Never true  Transportation Needs: No Transportation Needs  . Lack of Transportation (Medical): No  . Lack of Transportation (Non-Medical): No  Physical Activity:   . Days of Exercise per Week: Not on file  . Minutes of Exercise per Session: Not on file  Stress:   . Feeling of Stress : Not on file  Social Connections:   . Frequency of Communication with Friends and Family: Not on file  .  Frequency of Social Gatherings with Friends and Family: Not on file  . Attends Religious Services: Not on file  . Active Member of Clubs or Organizations: Not on file  . Attends Archivist  Meetings: Not on file  . Marital Status: Not on file  Intimate Partner Violence:   . Fear of Current or Ex-Partner: Not on file  . Emotionally Abused: Not on file  . Physically Abused: Not on file  . Sexually Abused: Not on file    Outpatient Medications Prior to Visit  Medication Sig Dispense Refill  . aspirin EC 81 MG tablet Take 81 mg by mouth daily.    . cholecalciferol (VITAMIN D) 1000 units tablet Take 1,000 Units by mouth daily.    [name] dicyclomine (BENTYL) 20 MG tablet Take 1 tablet (20 mg total) by mouth 4 (four) times daily -  before meals and at bedtime. 120 tablet 11  . furosemide (LASIX) 20 MG tablet Take 1 tablet (20 mg total) by mouth daily.    [name] HYDROcodone-acetaminophen (NORCO) 10-325 MG tablet Take 1-2 tablets by mouth daily.    [name] loratadine (CLARITIN) 10 MG tablet Take 10 mg by mouth daily as needed for allergies.    [name] meclizine (ANTIVERT) 12.5 MG tablet Take 1-2 tablets (12.5-25 mg total) by mouth 3 (three) times daily as needed for dizziness. 30 tablet 0  . metoprolol succinate (TOPROL-XL) 50 MG 24 hr tablet Take 0.5 tablets (25 mg total) by mouth daily.    [name] NARCAN 4 MG/0.1ML LIQD nasal spray kit Place 1 spray into the nose once as needed (opioid od).  (Patient not taking: Reported on 10/01/2019)    . nitroGLYCERIN (NITROSTAT) 0.4 MG SL tablet Place 1 tablet (0.4 mg total) under the tongue every 5 (five) minutes as needed for chest pain. (Patient not taking: Reported on 10/01/2019) 30 tablet 0  . omeprazole (PRILOSEC) 40 MG capsule Take 1 capsule (40 mg total) by mouth 2 (two) times daily. 180 capsule 3  . pregabalin (LYRICA) 50 MG capsule Take 50 mg by mouth 3 (three) times daily.    [name] Propylene Glycol (SYSTANE BALANCE OP) Place 1 drop into both eyes 2 (two) times daily as  needed (DRY EYES).     [name] rOPINIRole (REQUIP) 1 MG tablet TAKE 1 TABLET BY MOUTH TWICE A DAY 180 tablet 1  . sacubitril-valsartan (ENTRESTO) 97-103 MG Take 1 tablet by mouth 2 (two) times daily. 180 tablet 3  . sertraline (ZOLOFT) 50 MG tablet TAKE 1 AND 1/2 TABLETS DAILY BY MOUTH 135 tablet 1  . traZODone (DESYREL) 50 MG tablet TAKE 1-2 TABLETS (50-100 MG TOTAL) BY MOUTH AT BEDTIME. 180 tablet 1   No facility-administered medications prior to visit.    Allergies  Allergen Reactions  . Dextromethorphan-Guaifenesin Nausea And Vomiting  . Morphine Nausea And Vomiting  . Gabapentin Other (See Comments)    Dizziness, made her feel loopy  . Penicillins Rash    "> 30 years ago; best I can remember it was just a light rash on my arm" Did it involve swelling of the face/tongue/throat, SOB, or low BP? No Did it involve sudden or severe rash/hives, skin peeling, or any reaction on the inside of your mouth or nose? Unknown Did you need to seek medical attention at a hospital or doctor's office? No When did it last happen?more than 30 years  If all above answers are "NO", may proceed with cephalosporin use.     Review of Systems  Constitutional: Positive for malaise/fatigue. Negative for chills and fever.  HENT: Positive for congestion and sinus pain.   Eyes: Negative for blurred vision.  Respiratory: Positive for cough, sputum production and shortness of breath.   Cardiovascular:  Negative for chest pain, palpitations and leg swelling.  Gastrointestinal: Negative for abdominal pain, blood in stool and nausea.  Genitourinary: Negative for dysuria and frequency.  Musculoskeletal: Positive for back pain and myalgias. Negative for falls.  Skin: Negative for rash.  Neurological: Negative for dizziness, loss of consciousness and headaches.  Endo/Heme/Allergies: Negative for environmental allergies.  Psychiatric/Behavioral: Negative for depression. The patient is not nervous/anxious.          Objective:    Physical Exam Constitutional:      Appearance: Normal appearance. She is obese. She is ill-appearing.  HENT:     Head: Normocephalic and atraumatic.     Right Ear: External ear normal.     Left Ear: External ear normal.  Eyes:     General:        Right eye: No discharge.        Left eye: No discharge.  Pulmonary:     Effort: Pulmonary effort is normal.  Neurological:     Mental Status: She is alert and oriented to person, place, and time.  Psychiatric:        Behavior: Behavior normal.     There were no vitals taken for this visit. Wt Readings from Last 3 Encounters:  10/29/19 233 lb (105.7 kg)  09/18/19 239 lb 6.4 oz (108.6 kg)  09/13/19 237 lb (107.5 kg)    Diabetic Foot Exam - Simple   No data filed     Lab Results  Component Value Date   WBC 6.6 09/18/2019   HGB 12.3 09/18/2019   HCT 36.9 09/18/2019   PLT 191.0 09/18/2019   GLUCOSE 93 09/18/2019   CHOL 148 10/30/2015   TRIG 68.0 10/30/2015   HDL 60.50 10/30/2015   LDLDIRECT 166.0 04/14/2010   LDLCALC 74 10/30/2015   ALT 11 03/15/2019   AST 13 03/15/2019   NA 142 09/18/2019   K 3.8 09/18/2019   CL 106 09/18/2019   CREATININE 0.92 09/18/2019   BUN 19 09/18/2019   CO2 28 09/18/2019   TSH 1.62 12/16/2017   INR 1.06 05/06/2017   HGBA1C 5.7 (H) 12/29/2017    Lab Results  Component Value Date   TSH 1.62 12/16/2017   Lab Results  Component Value Date   WBC 6.6 09/18/2019   HGB 12.3 09/18/2019   HCT 36.9 09/18/2019   MCV 90.7 09/18/2019   PLT 191.0 09/18/2019   Lab Results  Component Value Date   NA 142 09/18/2019   K 3.8 09/18/2019   CO2 28 09/18/2019   GLUCOSE 93 09/18/2019   BUN 19 09/18/2019   CREATININE 0.92 09/18/2019   BILITOT 0.4 03/15/2019   ALKPHOS 93 03/15/2019   AST 13 03/15/2019   ALT 11 03/15/2019   PROT 7.0 03/15/2019   ALBUMIN 4.0 03/15/2019   CALCIUM 8.7 09/18/2019   ANIONGAP 10 05/08/2017   GFR 59.52 (L) 09/18/2019   Lab Results  Component Value Date    CHOL 148 10/30/2015   Lab Results  Component Value Date   HDL 60.50 10/30/2015   Lab Results  Component Value Date   LDLCALC 74 10/30/2015   Lab Results  Component Value Date   TRIG 68.0 10/30/2015   Lab Results  Component Value Date   CHOLHDL 2 10/30/2015   Lab Results  Component Value Date   HGBA1C 5.7 (H) 12/29/2017       Assessment & Plan:   Problem List Items Addressed This Visit    Hypertension    Monitor and  report concerns.       Cough    Patient has been sick for about 2 days. With headache, sinus pressure, rhinorrhea, PND, dry cough that is keeping her up and making her chest hurt with coughing. She also notes her hair and teeth hurt. She has been lying in bed. She is unvaccinated. She agrees her son will take her to get tested today and then she will let us know the results so we can set her up with infusion. She is sent in [place] and [place] and have asked her to get a pulse oximeter to keep an eye on her oxygen levels. If in the 90s we can wait on test results. If in the 80s then needs to seek care.          I am having Darylene Price. Holsinger start on azithromycin and benzonatate. I am also having her maintain her aspirin EC, Propylene Glycol (SYSTANE BALANCE OP), nitroGLYCERIN, cholecalciferol, meclizine, Narcan, loratadine, Entresto, HYDROcodone-acetaminophen, dicyclomine, omeprazole, traZODone, pregabalin, sertraline, rOPINIRole, metoprolol succinate, and furosemide.  Meds ordered this encounter  Medications  . azithromycin (ZITHROMAX) 250 MG tablet    Sig: 2 tabs po once and then 1 tab po daily x 4 days    Dispense:  6 tablet    Refill:  0  . benzonatate (TESSALON PERLES) 100 MG capsule    Sig: Take 1 capsule (100 mg total) by mouth 3 (three) times daily as needed for cough.    Dispense:  30 capsule    Refill:  1    I discussed the assessment and treatment plan with the patient. The patient was provided an opportunity to ask questions and all were  answered. The patient agreed with the plan and demonstrated an understanding of the instructions.   The patient was advised to call back or seek an in-person evaluation if the symptoms worsen or if the condition fails to improve as anticipated.  I provided 20 minutes of non-face-to-face time during this encounter.   Penni Homans, MD

## 2019-11-11 DIAGNOSIS — Z20828 Contact with and (suspected) exposure to other viral communicable diseases: Secondary | ICD-10-CM | POA: Diagnosis not present

## 2019-11-12 ENCOUNTER — Other Ambulatory Visit (HOSPITAL_COMMUNITY): Payer: Self-pay | Admitting: Family

## 2019-11-12 ENCOUNTER — Telehealth: Payer: Self-pay

## 2019-11-12 ENCOUNTER — Telehealth: Payer: Self-pay | Admitting: Internal Medicine

## 2019-11-12 DIAGNOSIS — U071 COVID-19: Secondary | ICD-10-CM

## 2019-11-12 MED ORDER — ONDANSETRON HCL 8 MG PO TABS: 8.0000 mg | ORAL_TABLET | Freq: Three times a day (TID) | ORAL | 0 refills | Status: DC | PRN

## 2019-11-12 NOTE — Telephone Encounter (Signed)
Zofran sent, let her know.

## 2019-11-12 NOTE — Telephone Encounter (Signed)
Referral sent 

## 2019-11-12 NOTE — Progress Notes (Signed)
I connected by phone with [name] on 11/12/2019 at 4:14 PM to discuss the potential use of a new treatment for mild to moderate COVID-19 viral infection in non-hospitalized patients.  This patient is a 74 y.o. female that meets the FDA criteria for Emergency Use Authorization of COVID monoclonal antibody casirivimab/imdevimab or bamlanivimab/eteseviamb.  Has a (+) direct SARS-CoV-2 viral test result  Has mild or moderate COVID-19   Is NOT hospitalized due to COVID-19  Is within 10 days of symptom onset  Has at least one of the high risk factor(s) for progression to severe COVID-19 and/or hospitalization as defined in EUA.  Specific high risk criteria : Older age (>/= 75 yo), BMI > 25 and Cardiovascular disease or hypertension   Symptoms of H/A, runny nose, cough, and aches began 11/10/19.   I have spoken and communicated the following to the patient or parent/caregiver regarding COVID monoclonal antibody treatment:  1. FDA has authorized the emergency use for the treatment of mild to moderate COVID-19 in adults and pediatric patients with positive results of direct SARS-CoV-2 viral testing who are 19 years of age and older weighing at least 40 kg, and who are at high risk for progressing to severe COVID-19 and/or hospitalization.  2. The significant known and potential risks and benefits of COVID monoclonal antibody, and the extent to which such potential risks and benefits are unknown.  3. Information on available alternative treatments and the risks and benefits of those alternatives, including clinical trials.  4. Patients treated with COVID monoclonal antibody should continue to self-isolate and use infection control measures (e.g., wear mask, isolate, social distance, avoid sharing personal items, clean and disinfect "high touch" surfaces, and frequent handwashing) according to CDC guidelines.   5. The patient or parent/caregiver has the option to accept or refuse COVID  monoclonal antibody treatment.  After reviewing this information with the patient, the patient has agreed to receive one of the available covid 19 monoclonal antibodies and will be provided an appropriate fact sheet prior to infusion. Asencion Gowda, NP 11/12/2019 4:14 PM

## 2019-11-12 NOTE — Telephone Encounter (Signed)
Patient's Son, Legrand Como, called stating patient had a VOV with Dr. Charlett Blake on Saturday and tested positive for Covid on Saturday.  Patient is nauseated and would like something called in for it.  Pharmacy is [facility] in [place].

## 2019-11-12 NOTE — Telephone Encounter (Signed)
Spoke w/ Pt- informed Rx sent.

## 2019-11-12 NOTE — Telephone Encounter (Signed)
Patient states she tested positive for covid on 11/10/19. She would like to get the infusion.

## 2019-11-12 NOTE — Telephone Encounter (Signed)
I have referred her to [facility] infusion pool. Please advise.

## 2019-11-13 ENCOUNTER — Ambulatory Visit (HOSPITAL_COMMUNITY)
Admission: RE | Admit: 2019-11-13 | Discharge: 2019-11-13 | Disposition: A | Discharge status: Home / Self Care | Payer: Medicare Other | Source: Ambulatory Visit | Attending: Pulmonary Disease | Admitting: Pulmonary Disease

## 2019-11-13 DIAGNOSIS — Z23 Encounter for immunization: Secondary | ICD-10-CM | POA: Insufficient documentation

## 2019-11-13 DIAGNOSIS — U071 COVID-19: Secondary | ICD-10-CM | POA: Diagnosis not present

## 2019-11-13 MED ORDER — METHYLPREDNISOLONE SODIUM SUCC 125 MG IJ SOLR: 125.0000 mg | Freq: Once | INTRAMUSCULAR | Status: DC | PRN

## 2019-11-13 MED ORDER — DIPHENHYDRAMINE HCL 50 MG/ML IJ SOLN: 50.0000 mg | Freq: Once | INTRAMUSCULAR | Status: DC | PRN

## 2019-11-13 MED ORDER — SODIUM CHLORIDE 0.9 % IV SOLN: INTRAVENOUS | Status: DC | PRN

## 2019-11-13 MED ORDER — ALBUTEROL SULFATE HFA 108 (90 BASE) MCG/ACT IN AERS: 2.0000 | INHALATION_SPRAY | Freq: Once | RESPIRATORY_TRACT | Status: DC | PRN

## 2019-11-13 MED ORDER — ONDANSETRON HCL 4 MG/2ML IJ SOLN
4.0000 mg | Freq: Once | INTRAMUSCULAR | Status: AC
  Administered 2019-11-13: 4 mg via INTRAVENOUS
  Filled 2019-11-13: qty 2

## 2019-11-13 MED ORDER — FAMOTIDINE IN NACL 20-0.9 MG/50ML-% IV SOLN: 20.0000 mg | Freq: Once | INTRAVENOUS | Status: DC | PRN

## 2019-11-13 MED ORDER — EPINEPHRINE 0.3 MG/0.3ML IJ SOAJ: 0.3000 mg | Freq: Once | INTRAMUSCULAR | Status: DC | PRN

## 2019-11-13 MED ORDER — SODIUM CHLORIDE 0.9 % IV SOLN
1200.0000 mg | Freq: Once | INTRAVENOUS | Status: AC
  Administered 2019-11-13: 1200 mg via INTRAVENOUS

## 2019-11-13 NOTE — Discharge Instructions (Signed)

## 2019-11-13 NOTE — Progress Notes (Signed)
  Diagnosis: COVID-19  Physician: Dr Joya Gaskins   Procedure: [facility]fusion [facility] Med: casirivimab\imdevimab infusion - Provided patient with casirivimab\imdevimab fact sheet for patients, parents and caregivers prior to infusion.  Complications: No immediate complications noted.  Discharge: Discharged home   [name] 11/13/2019

## 2019-11-19 ENCOUNTER — Telehealth: Payer: Self-pay

## 2019-11-19 MED ORDER — PROMETHAZINE HCL 25 MG PO TABS: 25.0000 mg | ORAL_TABLET | Freq: Three times a day (TID) | ORAL | 0 refills | Status: DC | PRN

## 2019-11-19 NOTE — Telephone Encounter (Signed)
Okay, switch to Phenergan, I sent a prescription. Watch for excessive drowsiness. Also if she has severe symptoms, abdominal pain, blood in the stools needs to be seen

## 2019-11-19 NOTE — Telephone Encounter (Signed)
Please advise 

## 2019-11-19 NOTE — Telephone Encounter (Signed)
Pt called stating she was feeling a little better, but the nausea is now worse and it seems like after she takes the pill Dr. Larose Kells gave her for nausea, within 30 minutes or so the nausea is back.  She is wondering if there is anything else she can take or do for it.

## 2019-11-19 NOTE — Telephone Encounter (Signed)
Spoke w/ Pt- informed of recommendations. Pt verbalized understanding.  

## 2019-11-21 ENCOUNTER — Ambulatory Visit: Payer: Medicare Other | Admitting: Cardiology

## 2019-11-26 NOTE — Telephone Encounter (Signed)
Pt was dx w/ covid over 2 weeks ago- she needs to wait another 10 days?

## 2019-11-26 NOTE — Telephone Encounter (Signed)
If she is gradually better, please arrange a visit in about 10 days from today. If she is not better or getting worse we will probably need to see her either in person or virtually.

## 2019-11-26 NOTE — Telephone Encounter (Signed)
Appt scheduled 11/27/2019.

## 2019-11-26 NOTE — Telephone Encounter (Signed)
Please advise 

## 2019-11-26 NOTE — Telephone Encounter (Signed)
Caller : Tenn[place]  Call Back # [phone]  Patient states dx with covid over tow weeks ago, patient would like to know what are the next steps , she is not feeling better?  Please Advise

## 2019-11-27 ENCOUNTER — Encounter: Payer: Self-pay | Admitting: Internal Medicine

## 2019-11-27 ENCOUNTER — Telehealth (INDEPENDENT_AMBULATORY_CARE_PROVIDER_SITE_OTHER): Payer: Medicare Other | Admitting: Internal Medicine

## 2019-11-27 ENCOUNTER — Other Ambulatory Visit: Payer: Self-pay

## 2019-11-27 VITALS — Ht 66.0 in | Wt 225.0 lb

## 2019-11-27 DIAGNOSIS — U071 COVID-19: Secondary | ICD-10-CM | POA: Diagnosis not present

## 2019-11-27 MED ORDER — HYDROCODONE-HOMATROPINE 5-1.5 MG/5ML PO SYRP
5.0000 mL | ORAL_SOLUTION | Freq: Three times a day (TID) | ORAL | 0 refills | Status: DC | PRN
Start: 2019-11-27 — End: 2019-12-07

## 2019-11-27 MED ORDER — ALBUTEROL SULFATE HFA 108 (90 BASE) MCG/ACT IN AERS: 2.0000 | INHALATION_SPRAY | Freq: Four times a day (QID) | RESPIRATORY_TRACT | 2 refills | Status: DC | PRN

## 2019-11-27 NOTE — Progress Notes (Signed)
Pre visit review using our clinic review tool, if applicable. No additional management support is needed unless otherwise documented below in the visit note. 

## 2019-11-27 NOTE — Progress Notes (Signed)
Subjective:    Patient ID: [name], female    DOB: October 07, 1945, 74 y.o.   MRN: [id number]  DOS:  11/27/2019 Type of visit - description: Virtual Visit via Video Note  I connected with the above patient  by a video enabled telemedicine application and verified that I am speaking with the correct person using two identifiers.   THIS ENCOUNTER IS A VIRTUAL VISIT DUE TO COVID-19 - PATIENT WAS NOT SEEN IN THE OFFICE. PATIENT HAS CONSENTED TO VIRTUAL VISIT / TELEMEDICINE VISIT   Location of patient: home  Location of provider: office  Persons participating in the virtual visit: patient, provider   I discussed the limitations of evaluation and management by telemedicine and the availability of in person appointments. The patient expressed understanding and agreed to proceed.  Follow-up She was seen 11/10/2019 with cough, fatigue, malaise. She also had chest congestion and wheezing. She was prescribed a Z-Pak but eventually Covid test came back positive. Got the monoclonal antibodies infusion 11/13/2019. Since then she is somewhat better, has good days and bad days. Cough is now on and off, no more sputum production. At no point had fever chills She did have significant nausea, see previous phone calls but that is subsiding.  Occasionally has diarrhea. The cough is worse when she lays down, no lower extremity edema   Review of Systems See above   Past Medical History:  Diagnosis Date  . Allergy   . Anemia   . Anxiety   . Barrett's esophagus   . Bilateral carpal tunnel syndrome    Dr. Eddie Dibbles, having injection therapy  . C. difficile diarrhea 08/01/2012   severe 2014  . Carotid arterial disease ([place])   . Cataract    removed both eyes   . Chronic combined systolic and diastolic CHF (congestive heart failure) ([place])   . Chronic cystitis    Dr. Matilde Sprang  . Chronic lower back pain   . Chronic pain syndrome   . Depression   . Dizziness   . DJD (degenerative joint disease)      bilateral hands; knees  . Family history of anesthesia complication    Mother had severe N/V  . Fibromyalgia   . GERD (gastroesophageal reflux disease)   . H/O cardiac catheterization    (-) cath 12-2002  , cath again 2011 (-)  . History of colon polyps   . HTN (hypertension)   . Hyperlipidemia    past hx   . IBS (irritable bowel syndrome)   . Insomnia   . Migraine   . Morbid obesity ([place])   . Non-ischemic cardiomyopathy ([place])   . Osteoporosis    pt unsure of this  . RLS (restless legs syndrome)   . Vertigo   . Vitamin B 12 deficiency 04/09/2013    Past Surgical History:  Procedure Laterality Date  . Arm surgery Left    "don't remember what they did; arm wasn't broken"  . BILATERAL KNEE ARTHROSCOPY Bilateral   . BIOPSY  08/07/2018   Procedure: BIOPSY;  Surgeon: Ladene Artist, MD;  Location: [facility];  Service: Endoscopy;;  . CARDIAC CATHETERIZATION  01/22/10   clean cath  . CATARACT EXTRACTION W/ INTRAOCULAR LENS  IMPLANT, BILATERAL Bilateral   . CHOLECYSTECTOMY N/A 10/25/2016   Procedure: LAPAROSCOPIC CHOLECYSTECTOMY;  Surgeon: Georganna Skeans, MD;  Location: [place];  Service: General;  Laterality: N/A;  . COLONOSCOPY    . COLONOSCOPY W/ POLYPECTOMY    . ESOPHAGOGASTRODUODENOSCOPY (EGD) WITH PROPOFOL N/A 08/07/2018  Procedure: ESOPHAGOGASTRODUODENOSCOPY (EGD) WITH PROPOFOL;  Surgeon: Ladene Artist, MD;  [facility];  Service: Endoscopy;  Laterality: N/A;  . FOOT SURGERY Bilateral    toenails removed; callus removed on right; hammertoes right"  . Knot     "removed from right neck; not a goiter"  . LUMBAR DISC SURGERY     L5 S1 anterior fusion  . OOPHORECTOMY    . RIGHT/LEFT HEART CATH AND CORONARY ANGIOGRAPHY N/A 05/06/2017   Procedure: RIGHT/LEFT HEART CATH AND CORONARY ANGIOGRAPHY;  Surgeon: Nelva Bush, MD;  Location: [place] [facility];  Service: Cardiovascular;  Laterality: N/A;  . SHOULDER ARTHROSCOPY Right   . TOTAL KNEE ARTHROPLASTY   10/05/2011   Procedure: TOTAL KNEE ARTHROPLASTY;  Surgeon: Hessie Dibble, MD;  Location: [place];  Service: Orthopedics;  Laterality: Right;  . UPPER GASTROINTESTINAL ENDOSCOPY    . VAGINAL HYSTERECTOMY     for endometriosis    Allergies as of 11/27/2019      Reactions   Dextromethorphan-guaifenesin Nausea And Vomiting   Morphine Nausea And Vomiting   Gabapentin Other (See Comments)   Dizziness, made her feel loopy   Penicillins Rash   "> 30 years ago; best I can remember it was just a light rash on my arm" Did it involve swelling of the face/tongue/throat, SOB, or low BP? No Did it involve sudden or severe rash/hives, skin peeling, or any reaction on the inside of your mouth or nose? Unknown Did you need to seek medical attention at a hospital or doctor's office? No When did it last happen?more than 30 years  If all above answers are "NO", may proceed with cephalosporin use.      Medication List       Accurate as of November 27, 2019  4:28 PM. If you have any questions, ask your nurse or doctor.        aspirin EC 81 MG tablet Take 81 mg by mouth daily.   azithromycin 250 MG tablet Commonly known as: ZITHROMAX 2 tabs po once and then 1 tab po daily x 4 days   benzonatate 100 MG capsule Commonly known as: Tessalon Perles Take 1 capsule (100 mg total) by mouth 3 (three) times daily as needed for cough.   cholecalciferol 1000 units tablet Commonly known as: VITAMIN D Take 1,000 Units by mouth daily.   dicyclomine 20 MG tablet Commonly known as: BENTYL Take 1 tablet (20 mg total) by mouth 4 (four) times daily -  before meals and at bedtime.   Entresto 97-103 MG Generic drug: sacubitril-valsartan Take 1 tablet by mouth 2 (two) times daily.   furosemide 20 MG tablet Commonly known as: LASIX Take 1 tablet (20 mg total) by mouth daily.   HYDROcodone-acetaminophen 10-325 MG tablet Commonly known as: NORCO Take 1-2 tablets by mouth daily.   loratadine 10 MG  tablet Commonly known as: CLARITIN Take 10 mg by mouth daily as needed for allergies.   meclizine 12.5 MG tablet Commonly known as: ANTIVERT Take 1-2 tablets (12.5-25 mg total) by mouth 3 (three) times daily as needed for dizziness.   metoprolol succinate 50 MG 24 hr tablet Commonly known as: TOPROL-XL Take 0.5 tablets (25 mg total) by mouth daily.   Narcan 4 MG/0.1ML Liqd nasal spray kit Generic drug: naloxone Place 1 spray into the nose once as needed (opioid od).   nitroGLYCERIN 0.4 MG SL tablet Commonly known as: NITROSTAT Place 1 tablet (0.4 mg total) under the tongue every 5 (five) minutes  as needed for chest pain.   omeprazole 40 MG capsule Commonly known as: PRILOSEC Take 1 capsule (40 mg total) by mouth 2 (two) times daily.   ondansetron 8 MG tablet Commonly known as: Zofran Take 1 tablet (8 mg total) by mouth every 8 (eight) hours as needed for nausea or vomiting.   pregabalin 50 MG capsule Commonly known as: LYRICA Take 50 mg by mouth 3 (three) times daily.   promethazine 25 MG tablet Commonly known as: PHENERGAN Take 1 tablet (25 mg total) by mouth every 8 (eight) hours as needed for nausea or vomiting.   rOPINIRole 1 MG tablet Commonly known as: REQUIP TAKE 1 TABLET BY MOUTH TWICE A DAY   sertraline 50 MG tablet Commonly known as: ZOLOFT TAKE 1 AND 1/2 TABLETS DAILY BY MOUTH   SYSTANE BALANCE OP Place 1 drop into both eyes 2 (two) times daily as needed (DRY EYES).   traZODone 50 MG tablet Commonly known as: DESYREL TAKE 1-2 TABLETS (50-100 MG TOTAL) BY MOUTH AT BEDTIME.          Objective:   Physical Exam Ht _0  (1.676 m)   Wt 225 lb (102.1 kg)   BMI 36.32 kg/m  This is a virtual video visit, she did not have a blood pressure available, I noticed cough a couple of times without able chest congestion and wheezing.  No distress.    Assessment     Assessment HTN Hyperlipidemia-  Pravachol, Crestor: Myalgias. Intolerant to Zetia (joint  aches), see OV  06/2016  Depression, Anxiety (on clonazepam), insomnia (on trazodone):  depression x many  years, remotely on zoloft and other meds per psych, I rx lexapro 2015, then was rx effexor x a while Morbid obesity CV: CHF/ non ischemic cardiomyopathy dx 04-2017; cath normal coronaries, EF ~ 30% Carotid artery disease: 1 to 39% bilateral carotids 09-2016, start aspirin per neurology GI:  --GERD, Barrett's esophagus, had a EGD 07/2018  --h/o persistent C. 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  See note from 02/14/2018 OAB, LUTS-- on myrbetriq Dr McDarmoth  Raynaud phenomena---- DX 07/2017  PLAN COVID-19: [id number] diagnosed 11/10/2019, had a monoclonal infusion 11/13/2019. Has good days and bad days, overall improving, still has some cough and fatigue. We agreed on rest, good hydration, start checking BPs since she has CHF and takes Lasix and other medications. Symptom management: Hycodan 3 times daily as needed for cough (hold hydrocodone when she takes Hycodan ) Albuterol as needed for wheezing and chest congestion Call if not gradually better We will get a follow-up visit for next week Encouraged to get a Covid vaccine January 2022 Follow-up 1 week     I discussed the assessment and treatment plan with the patient. The patient was provided an opportunity to ask questions and all were answered. The patient agreed with the plan and demonstrated an understanding of the instructions.   The patient was advised to call back or seek an in-person evaluation if the symptoms worsen or if the condition fails to improve as anticipated.

## 2019-11-29 NOTE — Assessment & Plan Note (Signed)
COVID-19: COVID-19 diagnosed 11/10/2019, had a monoclonal infusion 11/13/2019. Has good days and bad days, overall improving, still has some cough and fatigue. We agreed on rest, good hydration, start checking BPs since she has CHF and takes Lasix and other medications. Symptom management: Hycodan 3 times daily as needed for cough (hold hydrocodone when she takes Hycodan ) Albuterol as needed for wheezing and chest congestion Call if not gradually better We will get a follow-up visit for next week Encouraged to get a Covid vaccine January 2022 Follow-up 1 week

## 2019-12-05 ENCOUNTER — Encounter: Payer: Self-pay | Admitting: Internal Medicine

## 2019-12-05 ENCOUNTER — Ambulatory Visit (HOSPITAL_BASED_OUTPATIENT_CLINIC_OR_DEPARTMENT_OTHER)
Admission: RE | Admit: 2019-12-05 | Discharge: 2019-12-05 | Disposition: A | Discharge status: Home / Self Care | Payer: Medicare Other | Source: Ambulatory Visit | Attending: Internal Medicine | Admitting: Internal Medicine

## 2019-12-05 ENCOUNTER — Ambulatory Visit (INDEPENDENT_AMBULATORY_CARE_PROVIDER_SITE_OTHER): Payer: Medicare Other | Admitting: Internal Medicine

## 2019-12-05 ENCOUNTER — Other Ambulatory Visit: Payer: Self-pay

## 2019-12-05 VITALS — BP 146/89 | HR 109 | Temp 98.1°F | Resp 18 | Ht 66.0 in | Wt 231.4 lb

## 2019-12-05 DIAGNOSIS — I2699 Other pulmonary embolism without acute cor pulmonale: Secondary | ICD-10-CM

## 2019-12-05 DIAGNOSIS — R918 Other nonspecific abnormal finding of lung field: Secondary | ICD-10-CM | POA: Diagnosis not present

## 2019-12-05 DIAGNOSIS — J90 Pleural effusion, not elsewhere classified: Secondary | ICD-10-CM | POA: Diagnosis not present

## 2019-12-05 DIAGNOSIS — Z8616 Personal history of COVID-19: Secondary | ICD-10-CM

## 2019-12-05 DIAGNOSIS — J189 Pneumonia, unspecified organism: Secondary | ICD-10-CM | POA: Diagnosis not present

## 2019-12-05 DIAGNOSIS — R0602 Shortness of breath: Secondary | ICD-10-CM | POA: Diagnosis not present

## 2019-12-05 DIAGNOSIS — I5042 Chronic combined systolic (congestive) and diastolic (congestive) heart failure: Secondary | ICD-10-CM | POA: Diagnosis not present

## 2019-12-05 LAB — COMPREHENSIVE METABOLIC PANEL
AG Ratio: 1.2 (calc) (ref 1.0–2.5)
ALT: 10 U/L (ref 6–29)
AST: 14 U/L (ref 10–35)
Albumin: 3.7 g/dL (ref 3.6–5.1)
Alkaline phosphatase (APISO): 71 U/L (ref 37–153)
BUN: 12 mg/dL (ref 7–25)
CO2: 26 mmol/L (ref 20–32)
Calcium: 8.4 mg/dL — ABNORMAL LOW (ref 8.6–10.4)
Chloride: 104 mmol/L (ref 98–110)
Creat: 0.8 mg/dL (ref 0.60–0.93)
Globulin: 3 g/dL (calc) (ref 1.9–3.7)
Glucose, Bld: 102 mg/dL — ABNORMAL HIGH (ref 65–99)
Potassium: 4 mmol/L (ref 3.5–5.3)
Sodium: 139 mmol/L (ref 135–146)
Total Bilirubin: 0.6 mg/dL (ref 0.2–1.2)
Total Protein: 6.7 g/dL (ref 6.1–8.1)

## 2019-12-05 LAB — BRAIN NATRIURETIC PEPTIDE: Brain Natriuretic Peptide: 82 pg/mL (ref <100)

## 2019-12-05 LAB — CBC WITH DIFFERENTIAL/PLATELET
Absolute Monocytes: 428 cells/uL (ref 200–950)
Basophils Absolute: 20 cells/uL (ref 0–200)
Basophils Relative: 0.3 %
Eosinophils Absolute: 129 cells/uL (ref 15–500)
Eosinophils Relative: 1.9 %
HCT: 38.7 % (ref 35.0–45.0)
Hemoglobin: 12.9 g/dL (ref 11.7–15.5)
Lymphs Abs: 1843 cells/uL (ref 850–3900)
MCH: 30.5 pg (ref 27.0–33.0)
MCHC: 33.3 g/dL (ref 32.0–36.0)
MCV: 91.5 fL (ref 80.0–100.0)
MPV: 10 fL (ref 7.5–12.5)
Monocytes Relative: 6.3 %
Neutro Abs: 4379 cells/uL (ref 1500–7800)
Neutrophils Relative %: 64.4 %
Platelets: 232 10*3/uL (ref 140–400)
RBC: 4.23 10*6/uL (ref 3.80–5.10)
RDW: 13.3 % (ref 11.0–15.0)
Total Lymphocyte: 27.1 %
WBC: 6.8 10*3/uL (ref 3.8–10.8)

## 2019-12-05 NOTE — Progress Notes (Signed)
Pre visit review using our clinic review tool, if applicable. No additional management support is needed unless otherwise documented below in the visit note. 

## 2019-12-05 NOTE — Progress Notes (Signed)
Subjective:    Patient ID: [name], female    DOB: 1945/02/17, 74 y.o.   MRN: [id number]  DOS:  12/05/2019 Type of visit - description: Follow-up   The patient had respiratory symptoms, got a Z-Pak but eventually a Covid test came back positive. Status post monoclonal antibody infusion on 11/13/2019.  Was seen virtually 11/27/2019, at that time she had good days and bad days but overall improving but was still coughing and fatigue. Here for follow-up  Still feels she has not completely recuperated. Continue with cough, mostly dry.  Some wheezing that decreased with albuterol No energy whatsoever Some DOE.  No edema. Occasionally has chest pain, anteriorly, mostly when she coughs or if she put pressure with her thumb on the chest (likely MSK)  She continue with ill-defined GI symptoms mostly episodic nausea without vomiting.  Occasional diarrhea without blood in the stools.  No abdominal pain (was tender on exam however).   Wt Readings from Last 3 Encounters:  12/05/19 231 lb 6 oz (105 kg)  11/27/19 225 lb (102.1 kg)  10/29/19 233 lb (105.7 kg)    Review of Systems See above   Past Medical History:  Diagnosis Date  . Allergy   . Anemia   . Anxiety   . Barrett's esophagus   . Bilateral carpal tunnel syndrome    Dr. Eddie Dibbles, having injection therapy  . C. difficile diarrhea 08/01/2012   severe 2014  . Carotid arterial disease ([place])   . Cataract    removed both eyes   . Chronic combined systolic and diastolic CHF (congestive heart failure) ([place])   . Chronic cystitis    Dr. Matilde Sprang  . Chronic lower back pain   . Chronic pain syndrome   . Depression   . Dizziness   . DJD (degenerative joint disease)    bilateral hands; knees  . Family history of anesthesia complication    Mother had severe N/V  . Fibromyalgia   . GERD (gastroesophageal reflux disease)   . H/O cardiac catheterization    (-) cath 12-2002  , cath again 2011 (-)  . History of colon polyps   .  HTN (hypertension)   . Hyperlipidemia    past hx   . IBS (irritable bowel syndrome)   . Insomnia   . Migraine   . Morbid obesity ([place])   . Non-ischemic cardiomyopathy ([place])   . Osteoporosis    pt unsure of this  . RLS (restless legs syndrome)   . Vertigo   . Vitamin B 12 deficiency 04/09/2013    Past Surgical History:  Procedure Laterality Date  . Arm surgery Left    "don't remember what they did; arm wasn't broken"  . BILATERAL KNEE ARTHROSCOPY Bilateral   . BIOPSY  08/07/2018   Procedure: BIOPSY;  Surgeon: Ladene Artist, MD;  [facility];  Service: Endoscopy;;  . CARDIAC CATHETERIZATION  01/22/10   clean cath  . CATARACT EXTRACTION W/ INTRAOCULAR LENS  IMPLANT, BILATERAL Bilateral   . CHOLECYSTECTOMY N/A 10/25/2016   Procedure: LAPAROSCOPIC CHOLECYSTECTOMY;  Surgeon: Georganna Skeans, MD;  Location: [place];  Service: General;  Laterality: N/A;  . COLONOSCOPY    . COLONOSCOPY W/ POLYPECTOMY    . ESOPHAGOGASTRODUODENOSCOPY (EGD) WITH PROPOFOL N/A 08/07/2018   Procedure: ESOPHAGOGASTRODUODENOSCOPY (EGD) WITH PROPOFOL;  Surgeon: Ladene Artist, MD;  [facility];  Service: Endoscopy;  Laterality: N/A;  . FOOT SURGERY Bilateral    toenails removed; callus removed on right; hammertoes right"  .  Knot     "removed from right neck; not a goiter"  . LUMBAR DISC SURGERY     L5 S1 anterior fusion  . OOPHORECTOMY    . RIGHT/LEFT HEART CATH AND CORONARY ANGIOGRAPHY N/A 05/06/2017   Procedure: RIGHT/LEFT HEART CATH AND CORONARY ANGIOGRAPHY;  Surgeon: Nelva Bush, MD;  Location: [place] [facility];  Service: Cardiovascular;  Laterality: N/A;  . SHOULDER ARTHROSCOPY Right   . TOTAL KNEE ARTHROPLASTY  10/05/2011   Procedure: TOTAL KNEE ARTHROPLASTY;  Surgeon: Hessie Dibble, MD;  Location: [place];  Service: Orthopedics;  Laterality: Right;  . UPPER GASTROINTESTINAL ENDOSCOPY    . VAGINAL HYSTERECTOMY     for endometriosis    Allergies as of 12/05/2019       Reactions   Dextromethorphan-guaifenesin Nausea And Vomiting   Morphine Nausea And Vomiting   Gabapentin Other (See Comments)   Dizziness, made her feel loopy   Penicillins Rash   "> 30 years ago; best I can remember it was just a light rash on my arm" Did it involve swelling of the face/tongue/throat, SOB, or low BP? No Did it involve sudden or severe rash/hives, skin peeling, or any reaction on the inside of your mouth or nose? Unknown Did you need to seek medical attention at a hospital or doctor's office? No When did it last happen?more than 30 years  If all above answers are "NO", may proceed with cephalosporin use.      Medication List       Accurate as of December 05, 2019  1:46 PM. If you have any questions, ask your nurse or doctor.        albuterol 108 (90 Base) MCG/ACT inhaler Commonly known as: VENTOLIN HFA Inhale 2 puffs into the lungs every 6 (six) hours as needed for wheezing (Chest congestion).   aspirin EC 81 MG tablet Take 81 mg by mouth daily.   cholecalciferol 1000 units tablet Commonly known as: VITAMIN D Take 1,000 Units by mouth daily.   dicyclomine 20 MG tablet Commonly known as: BENTYL Take 1 tablet (20 mg total) by mouth 4 (four) times daily -  before meals and at bedtime.   Entresto 97-103 MG Generic drug: sacubitril-valsartan Take 1 tablet by mouth 2 (two) times daily.   furosemide 20 MG tablet Commonly known as: LASIX Take 1 tablet (20 mg total) by mouth daily.   HYDROcodone-acetaminophen 10-325 MG tablet Commonly known as: NORCO Take 1-2 tablets by mouth daily.   HYDROcodone-homatropine 5-1.5 MG/5ML syrup Commonly known as: HYCODAN Take 5 mLs by mouth 3 (three) times daily as needed for cough.   loratadine 10 MG tablet Commonly known as: CLARITIN Take 10 mg by mouth daily as needed for allergies.   meclizine 12.5 MG tablet Commonly known as: ANTIVERT Take 1-2 tablets (12.5-25 mg total) by mouth 3 (three) times daily as  needed for dizziness.   metoprolol succinate 50 MG 24 hr tablet Commonly known as: TOPROL-XL Take 0.5 tablets (25 mg total) by mouth daily.   Narcan 4 MG/0.1ML Liqd nasal spray kit Generic drug: naloxone Place 1 spray into the nose once as needed (opioid od).   nitroGLYCERIN 0.4 MG SL tablet Commonly known as: NITROSTAT Place 1 tablet (0.4 mg total) under the tongue every 5 (five) minutes as needed for chest pain.   omeprazole 40 MG capsule Commonly known as: PRILOSEC Take 1 capsule (40 mg total) by mouth 2 (two) times daily.   ondansetron 8 MG tablet Commonly known as: Zofran  Take 1 tablet (8 mg total) by mouth every 8 (eight) hours as needed for nausea or vomiting.   pregabalin 50 MG capsule Commonly known as: LYRICA Take 50 mg by mouth 3 (three) times daily.   promethazine 25 MG tablet Commonly known as: PHENERGAN Take 1 tablet (25 mg total) by mouth every 8 (eight) hours as needed for nausea or vomiting.   rOPINIRole 1 MG tablet Commonly known as: REQUIP TAKE 1 TABLET BY MOUTH TWICE A DAY   sertraline 50 MG tablet Commonly known as: ZOLOFT TAKE 1 AND 1/2 TABLETS DAILY BY MOUTH   SYSTANE BALANCE OP Place 1 drop into both eyes 2 (two) times daily as needed (DRY EYES).   traZODone 50 MG tablet Commonly known as: DESYREL TAKE 1-2 TABLETS (50-100 MG TOTAL) BY MOUTH AT BEDTIME.          Objective:   Physical Exam BP (!) 146/89 (BP Location: Left Arm, Patient Position: Sitting, Cuff Size: Normal)   Pulse (!) 109   Temp 98.1 F (36.7 C) (Oral)   Resp 18   Ht 5' 6" (1.676 m)   Wt 231 lb 6 oz (105 kg)   SpO2 97%   BMI 37.34 kg/m  General:   Well developed, NAD, BMI noted.  HEENT:  Normocephalic . Face symmetric, atraumatic Neck: Slightly increased JVD at 45 degrees? Lungs:  Slight increase expiratory time.  No crackles, no rhonchi. Normal respiratory effort, no intercostal retractions, no accessory muscle use. Heart: RRR,  no murmur.  Abdomen:  Not  distended, soft, + tender mildly, diffusely most noticeable at the epigastric area. Skin: Not pale. Not jaundice Lower extremities: no pretibial edema bilaterally  Neurologic:  alert & oriented X3.  Speech normal, gait appropriate for age and unassisted Psych--  Cognition and judgment appear intact.  Cooperative with normal attention span and concentration.  Behavior appropriate. No anxious or depressed appearing.     Assessment    Assessment HTN Hyperlipidemia-  Pravachol, Crestor: Myalgias. Intolerant to Zetia (joint aches), see OV  06/2016  Depression, Anxiety (on clonazepam), insomnia (on trazodone):  depression x many  years, remotely on zoloft and other meds per psych, I rx lexapro 2015, then was rx effexor x a while Morbid obesity CV: CHF/ non ischemic cardiomyopathy dx 04-2017; cath normal coronaries, EF ~ 30% Carotid artery disease: 1 to 39% bilateral carotids 09-2016, start aspirin per neurology GI:  --GERD, Barrett's esophagus, had a EGD 07/2018  --h/o persistent C. 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  See note from 02/14/2018 OAB, LUTS-- on myrbetriq Dr McDarmoth  Raynaud phenomena---- DX 07/2017  PLAN COVID-19: [id number] diagnosed 11/10/2019, had a monoclonal infusion 11/13/2019. All  This in the context of morbid obesity, nonischemic cardiomyopathy, fibromyalgia. She continue with symptoms including fatigue, DOE, some cough and wheezing that responds to albuterol. She also has some GI symptoms including nausea and epigastric tenderness upon palpation. We asked the patient to walk in the office, O2 sat stay at 94%,  heart rate increased with exertion to the 130s. Suspect most of the symptoms are related to recent Covid infection but other etiologies are possible.. Plan: CMP, CBC, BNP,  chest x-ray. Addendum: Labs okay, chest x-ray: Multiple ill-defined opacities, possibly multifocal pneumonia. We will add a D-dimer due to fatigue and exertional tachycardia. Continue with albuterol, Mucinex DM.  Addendum:    D-dimer came back +, had a CT, multiple findings including a small acute PE.  Discussed with pulmonary, not unreasonable to treat as an outpatient since she seems to be hemodynamically stable and she has not been feeling well since Covid. Start Eliquis 10 mg twice daily, x1 week then 5 mg twice daily.  Samples and prescriptions provided. Hold aspirin. If signs of bleeding, symptoms increase, chest pain, increased shortness of breath: ER immediately All of the above carefully discussed with the patient Nausea: Started with COVID, history of cholecystectomy, she is a slightly TTP at the epigastric area, not taking NSAIDs to my knowledge.  Checking a CBC and contiue with PPIs twice daily. RTC 3 weeks, sooner if symptoms increase.  Time spent 44 minutes, see above  This visit occurred during the SARS-CoV-2 public health emergency.  Safety protocols were in place, including screening questions prior to the visit, additional usage of staff PPE, and extensive cleaning of exam room while observing appropriate contact time as indicated for disinfecting solutions.

## 2019-12-05 NOTE — Patient Instructions (Signed)
  GO TO THE LAB : Get the blood work     [place], [place] Come back for a checkup in 3 weeks  STOP BY THE FIRST FLOOR:  get the XR

## 2019-12-06 ENCOUNTER — Other Ambulatory Visit: Payer: Self-pay

## 2019-12-06 ENCOUNTER — Other Ambulatory Visit (INDEPENDENT_AMBULATORY_CARE_PROVIDER_SITE_OTHER): Payer: Medicare Other

## 2019-12-06 ENCOUNTER — Encounter (HOSPITAL_BASED_OUTPATIENT_CLINIC_OR_DEPARTMENT_OTHER): Payer: Self-pay

## 2019-12-06 ENCOUNTER — Telehealth: Payer: Self-pay | Admitting: Internal Medicine

## 2019-12-06 ENCOUNTER — Ambulatory Visit (HOSPITAL_BASED_OUTPATIENT_CLINIC_OR_DEPARTMENT_OTHER)
Admission: RE | Admit: 2019-12-06 | Discharge: 2019-12-06 | Disposition: A | Discharge status: Home / Self Care | Payer: Medicare Other | Source: Ambulatory Visit | Attending: Internal Medicine | Admitting: Internal Medicine

## 2019-12-06 DIAGNOSIS — I5042 Chronic combined systolic (congestive) and diastolic (congestive) heart failure: Secondary | ICD-10-CM | POA: Diagnosis not present

## 2019-12-06 DIAGNOSIS — R079 Chest pain, unspecified: Secondary | ICD-10-CM | POA: Diagnosis not present

## 2019-12-06 DIAGNOSIS — Z8616 Personal history of COVID-19: Secondary | ICD-10-CM | POA: Diagnosis not present

## 2019-12-06 DIAGNOSIS — R7989 Other specified abnormal findings of blood chemistry: Secondary | ICD-10-CM

## 2019-12-06 DIAGNOSIS — R911 Solitary pulmonary nodule: Secondary | ICD-10-CM | POA: Diagnosis not present

## 2019-12-06 DIAGNOSIS — J189 Pneumonia, unspecified organism: Secondary | ICD-10-CM | POA: Diagnosis not present

## 2019-12-06 DIAGNOSIS — R0600 Dyspnea, unspecified: Secondary | ICD-10-CM | POA: Diagnosis not present

## 2019-12-06 LAB — D-DIMER, QUANTITATIVE: D-Dimer, Quant: 5.33 mcg/mL FEU — ABNORMAL HIGH (ref <0.50)

## 2019-12-06 MED ORDER — IOHEXOL 350 MG/ML SOLN
100.0000 mL | Freq: Once | INTRAVENOUS | Status: AC | PRN
  Administered 2019-12-06: 100 mL via INTRAVENOUS

## 2019-12-06 MED ORDER — APIXABAN 5 MG PO TABS: 5.0000 mg | ORAL_TABLET | Freq: Two times a day (BID) | ORAL | 1 refills | Status: DC

## 2019-12-06 NOTE — Assessment & Plan Note (Signed)
COVID-19: COVID-19 diagnosed 11/10/2019, had a monoclonal infusion 11/13/2019. All  This in the context of morbid obesity, nonischemic cardiomyopathy, fibromyalgia. She continue with symptoms including fatigue, DOE, some cough and wheezing that responds to albuterol. She also has some GI symptoms including nausea and epigastric tenderness upon palpation. We asked the patient to walk in the office, O2 sat stay at 94%, heart rate increased with exertion to the 130s. Suspect most of the symptoms are related to recent Covid infection but other etiologies are possible.. Plan: CMP, CBC, BNP, chest x-ray. Addendum: Labs okay, chest x-ray: Multiple ill-defined opacities, possibly multifocal pneumonia. We will add a D-dimer due to fatigue and exertional tachycardia. Continue with albuterol, Mucinex DM.  Addendum:    D-dimer came back +, had a CT, multiple findings including a small acute PE.  Discussed with pulmonary, not unreasonable to treat as an outpatient since she seems to be hemodynamically stable and she has not been feeling well since Covid. Start Eliquis 10 mg twice daily, x1 week then 5 mg twice daily.  Samples and prescriptions provided. Hold aspirin. If signs of bleeding, symptoms increase, chest pain, increased shortness of breath: ER immediately All of the above carefully discussed with the patient Nausea: Started with COVID, history of cholecystectomy, she is a slightly TTP at the epigastric area, not taking NSAIDs to my knowledge.  Checking a CBC and contiue with PPIs twice daily. RTC 3 weeks, sooner if symptoms increase.

## 2019-12-06 NOTE — Telephone Encounter (Addendum)
CT showing a small PE, acute.  Multifocal pneumonia consistent with previous Covid, subpleural nodule. Symptoms are unchanged since she had Covid, she is not getting worse, hemodynamically stable other than well-tolerated tachycardia with exertion, no hypoxia. I believe is reasonable to be treated as an outpatient, discussed with pulmonary, they agree. Plan: Eliquis 10 mg twice daily for a week, then 5 mg daily.  Samples and prescription provided. Hold aspirin Strongly recommend ER if any sign of bleeding, stomach pain, worsening pulmonary symptoms. Follow-up with me next week: Please arrange Follow-up with pulmonary Appreciate  Warner Mccreedy NP help.

## 2019-12-07 ENCOUNTER — Observation Stay (HOSPITAL_COMMUNITY)
Admission: EM | Admit: 2019-12-07 | Discharge: 2019-12-08 | Disposition: A | Discharge status: Home / Self Care | Payer: Medicare Other | Attending: Emergency Medicine | Admitting: Emergency Medicine

## 2019-12-07 ENCOUNTER — Encounter (HOSPITAL_COMMUNITY): Payer: Self-pay | Admitting: Emergency Medicine

## 2019-12-07 ENCOUNTER — Other Ambulatory Visit: Payer: Self-pay | Admitting: Cardiology

## 2019-12-07 ENCOUNTER — Emergency Department (HOSPITAL_COMMUNITY): Payer: Medicare Other

## 2019-12-07 ENCOUNTER — Other Ambulatory Visit: Payer: Self-pay

## 2019-12-07 ENCOUNTER — Telehealth: Payer: Self-pay | Admitting: Internal Medicine

## 2019-12-07 DIAGNOSIS — J1282 Pneumonia due to coronavirus disease 2019: Secondary | ICD-10-CM

## 2019-12-07 DIAGNOSIS — I110 Hypertensive heart disease with heart failure: Secondary | ICD-10-CM | POA: Diagnosis not present

## 2019-12-07 DIAGNOSIS — Z7901 Long term (current) use of anticoagulants: Secondary | ICD-10-CM | POA: Insufficient documentation

## 2019-12-07 DIAGNOSIS — J90 Pleural effusion, not elsewhere classified: Secondary | ICD-10-CM | POA: Diagnosis not present

## 2019-12-07 DIAGNOSIS — J189 Pneumonia, unspecified organism: Secondary | ICD-10-CM | POA: Diagnosis not present

## 2019-12-07 DIAGNOSIS — Z20822 Contact with and (suspected) exposure to covid-19: Secondary | ICD-10-CM | POA: Diagnosis not present

## 2019-12-07 DIAGNOSIS — R000 Tachycardia, unspecified: Secondary | ICD-10-CM | POA: Diagnosis not present

## 2019-12-07 DIAGNOSIS — Z96651 Presence of right artificial knee joint: Secondary | ICD-10-CM | POA: Diagnosis not present

## 2019-12-07 DIAGNOSIS — I5042 Chronic combined systolic (congestive) and diastolic (congestive) heart failure: Secondary | ICD-10-CM | POA: Diagnosis not present

## 2019-12-07 DIAGNOSIS — Z87891 Personal history of nicotine dependence: Secondary | ICD-10-CM | POA: Diagnosis not present

## 2019-12-07 DIAGNOSIS — Z7982 Long term (current) use of aspirin: Secondary | ICD-10-CM | POA: Diagnosis not present

## 2019-12-07 DIAGNOSIS — R079 Chest pain, unspecified: Secondary | ICD-10-CM | POA: Diagnosis not present

## 2019-12-07 DIAGNOSIS — Z96611 Presence of right artificial shoulder joint: Secondary | ICD-10-CM | POA: Diagnosis not present

## 2019-12-07 DIAGNOSIS — I2699 Other pulmonary embolism without acute cor pulmonale: Principal | ICD-10-CM | POA: Diagnosis present

## 2019-12-07 DIAGNOSIS — Z79899 Other long term (current) drug therapy: Secondary | ICD-10-CM | POA: Diagnosis not present

## 2019-12-07 DIAGNOSIS — U071 COVID-19: Secondary | ICD-10-CM

## 2019-12-07 DIAGNOSIS — R0602 Shortness of breath: Secondary | ICD-10-CM | POA: Diagnosis not present

## 2019-12-07 DIAGNOSIS — I5030 Unspecified diastolic (congestive) heart failure: Secondary | ICD-10-CM | POA: Diagnosis present

## 2019-12-07 DIAGNOSIS — K2270 Barrett's esophagus without dysplasia: Secondary | ICD-10-CM | POA: Diagnosis not present

## 2019-12-07 LAB — COMPREHENSIVE METABOLIC PANEL WITH GFR
ALT: 14 U/L (ref 0–44)
AST: 19 U/L (ref 15–41)
Albumin: 3.7 g/dL (ref 3.5–5.0)
Alkaline Phosphatase: 67 U/L (ref 38–126)
Anion gap: 10 (ref 5–15)
BUN: 8 mg/dL (ref 8–23)
CO2: 23 mmol/L (ref 22–32)
Calcium: 8.3 mg/dL — ABNORMAL LOW (ref 8.9–10.3)
Chloride: 105 mmol/L (ref 98–111)
Creatinine, Ser: 0.8 mg/dL (ref 0.44–1.00)
GFR, Estimated: >60 mL/min
Glucose, Bld: 107 mg/dL — ABNORMAL HIGH (ref 70–99)
Potassium: 3.3 mmol/L — ABNORMAL LOW (ref 3.5–5.1)
Sodium: 138 mmol/L (ref 135–145)
Total Bilirubin: 0.8 mg/dL (ref 0.3–1.2)
Total Protein: 7.3 g/dL (ref 6.5–8.1)

## 2019-12-07 LAB — CBC
HCT: 39.4 % (ref 36.0–46.0)
Hemoglobin: 12.9 g/dL (ref 12.0–15.0)
MCH: 30.4 pg (ref 26.0–34.0)
MCHC: 32.7 g/dL (ref 30.0–36.0)
MCV: 92.7 fL (ref 80.0–100.0)
Platelets: 240 10*3/uL (ref 150–400)
RBC: 4.25 MIL/uL (ref 3.87–5.11)
RDW: 13.4 % (ref 11.5–15.5)
WBC: 6.5 10*3/uL (ref 4.0–10.5)
nRBC: 0 % (ref 0.0–0.2)

## 2019-12-07 LAB — D-DIMER, QUANTITATIVE: D-Dimer, Quant: 2.04 ug/mL-FEU — ABNORMAL HIGH (ref 0.00–0.50)

## 2019-12-07 LAB — TROPONIN I (HIGH SENSITIVITY): Troponin I (High Sensitivity): 9 ng/L (ref <18)

## 2019-12-07 LAB — RESPIRATORY PANEL BY RT PCR (FLU A&B, COVID)
Influenza A by PCR: NEGATIVE
Influenza B by PCR: NEGATIVE
SARS Coronavirus 2 by RT PCR: NEGATIVE

## 2019-12-07 LAB — BRAIN NATRIURETIC PEPTIDE: B Natriuretic Peptide: 63.1 pg/mL (ref 0.0–100.0)

## 2019-12-07 MED ORDER — ASPIRIN EC 81 MG PO TBEC
81.0000 mg | DELAYED_RELEASE_TABLET | Freq: Every day | ORAL | Status: DC
  Administered 2019-12-07 – 2019-12-08 (×2): 81 mg via ORAL
  Filled 2019-12-07 (×2): qty 1

## 2019-12-07 MED ORDER — ACETAMINOPHEN 325 MG PO TABS
650.0000 mg | ORAL_TABLET | Freq: Four times a day (QID) | ORAL | Status: DC | PRN
  Administered 2019-12-08: 650 mg via ORAL
  Filled 2019-12-07: qty 2

## 2019-12-07 MED ORDER — METOPROLOL SUCCINATE ER 25 MG PO TB24
25.0000 mg | ORAL_TABLET | Freq: Every day | ORAL | Status: DC
  Administered 2019-12-07 – 2019-12-08 (×2): 25 mg via ORAL
  Filled 2019-12-07 (×2): qty 1

## 2019-12-07 MED ORDER — SODIUM CHLORIDE 0.9% FLUSH
3.0000 mL | Freq: Two times a day (BID) | INTRAVENOUS | Status: DC
  Administered 2019-12-07: 3 mL via INTRAVENOUS

## 2019-12-07 MED ORDER — ACETAMINOPHEN 650 MG RE SUPP: 650.0000 mg | Freq: Four times a day (QID) | RECTAL | Status: DC | PRN

## 2019-12-07 MED ORDER — TRAZODONE HCL 50 MG PO TABS
50.0000 mg | ORAL_TABLET | Freq: Every evening | ORAL | Status: DC | PRN
  Filled 2019-12-07: qty 1

## 2019-12-07 MED ORDER — ALBUTEROL SULFATE HFA 108 (90 BASE) MCG/ACT IN AERS: 2.0000 | INHALATION_SPRAY | Freq: Four times a day (QID) | RESPIRATORY_TRACT | Status: DC | PRN

## 2019-12-07 MED ORDER — ROPINIROLE HCL 1 MG PO TABS
1.0000 mg | ORAL_TABLET | Freq: Two times a day (BID) | ORAL | Status: DC
  Administered 2019-12-07 – 2019-12-08 (×2): 1 mg via ORAL
  Filled 2019-12-07 (×2): qty 1

## 2019-12-07 MED ORDER — PANTOPRAZOLE SODIUM 40 MG PO TBEC
40.0000 mg | DELAYED_RELEASE_TABLET | Freq: Every day | ORAL | Status: DC
  Administered 2019-12-07 – 2019-12-08 (×2): 40 mg via ORAL
  Filled 2019-12-07 (×2): qty 1

## 2019-12-07 MED ORDER — DICYCLOMINE HCL 20 MG PO TABS
20.0000 mg | ORAL_TABLET | Freq: Three times a day (TID) | ORAL | Status: DC
  Administered 2019-12-07 – 2019-12-08 (×2): 20 mg via ORAL
  Filled 2019-12-07 (×4): qty 1

## 2019-12-07 MED ORDER — SACUBITRIL-VALSARTAN 97-103 MG PO TABS
1.0000 | ORAL_TABLET | Freq: Two times a day (BID) | ORAL | Status: DC
  Administered 2019-12-07 – 2019-12-08 (×2): 1 via ORAL
  Filled 2019-12-07 (×2): qty 1

## 2019-12-07 MED ORDER — APIXABAN 5 MG PO TABS
10.0000 mg | ORAL_TABLET | Freq: Two times a day (BID) | ORAL | Status: DC
  Administered 2019-12-07 – 2019-12-08 (×2): 10 mg via ORAL
  Filled 2019-12-07 (×2): qty 2

## 2019-12-07 MED ORDER — APIXABAN 5 MG PO TABS: 5.0000 mg | ORAL_TABLET | Freq: Two times a day (BID) | ORAL | Status: DC

## 2019-12-07 MED ORDER — VITAMIN D 25 MCG (1000 UNIT) PO TABS
1000.0000 [IU] | ORAL_TABLET | Freq: Every day | ORAL | Status: DC
  Administered 2019-12-07 – 2019-12-08 (×2): 1000 [IU] via ORAL
  Filled 2019-12-07 (×2): qty 1

## 2019-12-07 MED ORDER — SERTRALINE HCL 50 MG PO TABS
75.0000 mg | ORAL_TABLET | Freq: Every day | ORAL | Status: DC
  Administered 2019-12-08: 75 mg via ORAL
  Filled 2019-12-07: qty 1

## 2019-12-07 NOTE — Telephone Encounter (Signed)
Appt scheduled 12/11/19.

## 2019-12-07 NOTE — Telephone Encounter (Signed)
Pt has appt 12/20/2019- does she need to be seen sooner than that?

## 2019-12-07 NOTE — H&P (Signed)
History and Physical    [name]  VHQ:[id number]  DOB: 1945/03/26  DOA: 12/07/2019  PCP: Colon Branch, MD Patient coming from: home  Chief Complaint: Dyspnea and tachycardia  HPI:  [name] is a 74 yo CF with PMH HTN, chronic s/dCHF, nonischemic CM on cath 04/2017 Barrett's esophagus, IBS-D, osteopenia, OAB, DJD and whom also had COVID pna diagnosed on 11/10/19. She did not require hospitalization.  She was treated with a monoclonal antibody infusion on 11/13/2019, a Z-Pak, and supportive measures and recovered at home. She states that she has been hardly doing anything during her recovery and not getting out of bed much.  She notes over the past few days she became more short of breath, tachycardic at home.  She also had a slightly increased cough that was nonproductive. She has had some intermittent diarrhea that is consistent with her underlying IBS.  She denies any leg pain or swelling. She was evaluated outpatient by her PCP and had undergone D-dimer followed by CTA chest. This revealed a right lower lobe PE.  She had ongoing changes consistent with previous COVID-19 pneumonia.  There was also an 8 mm subpleural nodule in the right upper lobe noted. She started on a sample pack of Eliquis on 12/06/2019.  She did take her dose this morning.  Due to feeling more short of breath throughout today and increased heart rate, she ultimately presented to the ER for further evaluation.  Lab work-up was mostly unremarkable.  A D-dimer was checked and positive as expected.  Troponin also ordered. Due to her underlying heart disease, recent COVID-19 pna, worsening shortness of breath, tachycardia, she is admitted for further observation and obtaining echo.  Vitals in the ER included: Temp 99.1, heart rate 120, respirations 19-25, BP 141/87, SPO2 97% on room air.    I have personally briefly reviewed patient's old medical records in [facility] and discussed patient with the ER provider  when appropriate/indicated.  Assessment/Plan: * Pulmonary embolism ([place]) - provoked in setting of immobility at home since Covid diagnosis on 11/10/2019 as well as underlying hypercoagulability state from Covid itself -Patient started Eliquis on 12/06/2019.  She has had morning dose today.  Continue on Eliquis 10 mg twice daily -She needs a prescription assistance card; currently she cannot afford the current prescription sent to her pharmacy -Obtain echo -Continue monitoring on telemetry and O2 sats for any hypoxia  Chronic combined systolic and diastolic CHF (congestive heart failure) ([place]) -Continue Entresto and home meds -Previous echo reviewed; repeat echo now in setting of acute PE  BARRETTS ESOPHAGUS -Continue PPI     Code Status: Full DVT Prophylaxis: Eliquis Anticipated disposition is to: Home tomorrow  History: Past Medical History:  Diagnosis Date  . Allergy   . Anemia   . Anxiety   . Barrett's esophagus   . Bilateral carpal tunnel syndrome    Dr. Eddie Dibbles, having injection therapy  . C. difficile diarrhea 08/01/2012   severe 2014  . Carotid arterial disease ([place])   . Cataract    removed both eyes   . Chronic combined systolic and diastolic CHF (congestive heart failure) ([place])   . Chronic cystitis    Dr. Matilde Sprang  . Chronic lower back pain   . Chronic pain syndrome   . Depression   . Dizziness   . DJD (degenerative joint disease)    bilateral hands; knees  . Family history of anesthesia complication    Mother had severe N/V  . Fibromyalgia   .  GERD (gastroesophageal reflux disease)   . H/O cardiac catheterization    (-) cath 12-2002  , cath again 2011 (-)  . History of colon polyps   . HTN (hypertension)   . Hyperlipidemia    past hx   . IBS (irritable bowel syndrome)   . Insomnia   . Migraine   . Morbid obesity ([place])   . Non-ischemic cardiomyopathy ([place])   . Osteoporosis    pt unsure of this  . RLS (restless legs syndrome)   . Vertigo   .  Vitamin B 12 deficiency 04/09/2013    Past Surgical History:  Procedure Laterality Date  . Arm surgery Left    "don't remember what they did; arm wasn't broken"  . BILATERAL KNEE ARTHROSCOPY Bilateral   . BIOPSY  08/07/2018   Procedure: BIOPSY;  Surgeon: Ladene Artist, MD;  Location: [facility];  Service: Endoscopy;;  . CARDIAC CATHETERIZATION  01/22/10   clean cath  . CATARACT EXTRACTION W/ INTRAOCULAR LENS  IMPLANT, BILATERAL Bilateral   . CHOLECYSTECTOMY N/A 10/25/2016   Procedure: LAPAROSCOPIC CHOLECYSTECTOMY;  Surgeon: Georganna Skeans, MD;  Location: [facility];  Service: General;  Laterality: N/A;  . COLONOSCOPY    . COLONOSCOPY W/ POLYPECTOMY    . ESOPHAGOGASTRODUODENOSCOPY (EGD) WITH PROPOFOL N/A 08/07/2018   Procedure: ESOPHAGOGASTRODUODENOSCOPY (EGD) WITH PROPOFOL;  Surgeon: Ladene Artist, MD;  Location: [facility];  Service: Endoscopy;  Laterality: N/A;  . FOOT SURGERY Bilateral    toenails removed; callus removed on right; hammertoes right"  . Knot     "removed from right neck; not a goiter"  . LUMBAR DISC SURGERY     L5 S1 anterior fusion  . OOPHORECTOMY    . RIGHT/LEFT HEART CATH AND CORONARY ANGIOGRAPHY N/A 05/06/2017   Procedure: RIGHT/LEFT HEART CATH AND CORONARY ANGIOGRAPHY;  Surgeon: Nelva Bush, MD;  Location: [facility];  Service: Cardiovascular;  Laterality: N/A;  . SHOULDER ARTHROSCOPY Right   . TOTAL KNEE ARTHROPLASTY  10/05/2011   Procedure: TOTAL KNEE ARTHROPLASTY;  Surgeon: Hessie Dibble, MD;  Location: [facility];  Service: Orthopedics;  Laterality: Right;  . UPPER GASTROINTESTINAL ENDOSCOPY    . VAGINAL HYSTERECTOMY     for endometriosis     reports that she quit smoking about 44 years ago. She has a 5.00 pack-year smoking history. She has never used smokeless tobacco. She reports previous alcohol use. She reports that she does not use drugs.  Allergies  Allergen Reactions  . Dextromethorphan-Guaifenesin Nausea And Vomiting  . Morphine  Nausea And Vomiting  . Gabapentin Other (See Comments)    Dizziness, made her feel loopy  . Penicillins Rash    "> 30 years ago; best I can remember it was just a light rash on my arm" Did it involve swelling of the face/tongue/throat, SOB, or low BP? No Did it involve sudden or severe rash/hives, skin peeling, or any reaction on the inside of your mouth or nose? Unknown Did you need to seek medical attention at a hospital or doctor's office? No When did it last happen?more than 30 years  If all above answers are "NO", may proceed with cephalosporin use.     Family History  Problem Relation Age of Onset  . Lung cancer Mother   . Dementia Father   . CAD Father        dx in his 65s  . Stroke Father   . Congestive Heart Failure Father   . Colon cancer Maternal Grandfather   .  Esophageal cancer Brother   . Breast cancer Sister   . Diabetes Other        Aunt  . Colon cancer Maternal Uncle        2  . Hepatitis C Brother   . Rectal cancer Neg Hx   . Stomach cancer Neg Hx   . Colon polyps Neg Hx    Home Medications: Prior to Admission medications   Medication Sig Start Date End Date Taking? Authorizing Provider  albuterol (VENTOLIN HFA) 108 (90 Base) MCG/ACT inhaler Inhale 2 puffs into the lungs every 6 (six) hours as needed for wheezing (Chest congestion). 11/27/19  Yes Paz, Alda Berthold, MD  Apixaban Starter Pack, 33m and 519m (ELIQUIS DVT/PE STARTER PACK) Take 1 tablet by mouth See admin instructions. Take as directed on package: start with two-11m50mablets twice daily for 7 days. On day 8, switch to one-11mg46mblet twice daily.   Yes [provider]  aspirin EC 81 MG tablet Take 81 mg by mouth daily.   Yes [provider]  cholecalciferol (VITAMIN D) 1000 units tablet Take 1,000 Units by mouth daily.   Yes [provider]  dicyclomine (BENTYL) 20 MG tablet Take 1 tablet (20 mg total) by mouth 4 (four) times daily -  before meals and at bedtime. 02/13/19   Yes StarLadene Artist  metoprolol succinate (TOPROL-XL) 50 MG 24 hr tablet Take 0.5 tablets (25 mg total) by mouth daily. 10/29/19  Yes Paz, JoseAlda Berthold  NARCAN 4 MG/0.1ML LIQD nasal spray kit Place 1 spray into the nose once as needed (opioid od).  04/24/18  Yes [provider]  nitroGLYCERIN (NITROSTAT) 0.4 MG SL tablet Place 1 tablet (0.4 mg total) under the tongue every 5 (five) minutes as needed for chest pain. 05/08/17  Yes AlekAline August  omeprazole (PRILOSEC) 40 MG capsule Take 1 capsule (40 mg total) by mouth 2 (two) times daily. 03/14/19  Yes StarLadene Artist  rOPINIRole (REQUIP) 1 MG tablet TAKE 1 TABLET BY MOUTH TWICE A DAY Patient taking differently: Take 1 mg by mouth in the morning and at bedtime.  10/29/19  Yes Lowne Chase, Yvonne R, DO  sacubitril-valsartan (ENTRESTO) 97-103 MG Take 1 tablet by mouth 2 (two) times daily. 12/07/19  Yes SkaiJerline Pain  sertraline (ZOLOFT) 50 MG tablet TAKE 1 AND 1/2 TABLETS DAILY BY MOUTH Patient taking differently: Take 75 mg by mouth daily.  10/29/19  Yes LownRoma SchanzDO  traZODone (DESYREL) 50 MG tablet TAKE 1-2 TABLETS (50-100 MG TOTAL) BY MOUTH AT BEDTIME. Patient taking differently: Take 50-100 mg by mouth at bedtime. Take one tablet by mouth to help sleep, if still can not go to sleep, will take another tablet by mouth. 05/09/19  Yes LownAnn Held    Review of Systems:  Pertinent items noted in HPI and remainder of comprehensive ROS otherwise negative.  Physical Exam: Vitals:   12/07/19 1531 12/07/19 1533 12/07/19 1621 12/07/19 1730  BP:  (!) 141/87 136/76 (!) 146/74  Pulse:  (!) 120 (!) 102 89  Resp:  19 16 (!) 25  Temp:  99.1 F (37.3 C)    TempSrc:  Oral    SpO2:  97% 97% 99%  Weight: 105 kg     Height: '5\' 6"'  (1.676 m)      General appearance: alert, cooperative and no distress Head: Normocephalic, without obvious abnormality, atraumatic Eyes: EOMI Lungs: Scattered coarse sounds  bilaterally  Heart: Tachycardic, regular rhythm, S1-S2 present Abdomen: normal findings: bowel sounds normal and soft, non-tender Extremities: No tenderness.  Chronic soft swelling without asymmetry.  No palpable cord.  Negative Homans' sign Skin: mobility and turgor normal Neurologic: Grossly normal  Labs on Admission:  I have personally reviewed following labs and imaging studies Results for orders placed or performed during the hospital encounter of 12/07/19 (from the past 24 hour(s))  Comprehensive metabolic panel     Status: Abnormal   Collection Time: 12/07/19  3:54 PM  Result Value Ref Range   Sodium 138 135 - 145 mmol/L   Potassium 3.3 (L) 3.5 - 5.1 mmol/L   Chloride 105 98 - 111 mmol/L   CO2 23 22 - 32 mmol/L   Glucose, Bld 107 (H) 70 - 99 mg/dL   BUN 8 8 - 23 mg/dL   Creatinine, Ser 0.80 0.44 - 1.00 mg/dL   Calcium 8.3 (L) 8.9 - 10.3 mg/dL   Total Protein 7.3 6.5 - 8.1 g/dL   Albumin 3.7 3.5 - 5.0 g/dL   AST 19 15 - 41 U/L   ALT 14 0 - 44 U/L   Alkaline Phosphatase 67 38 - 126 U/L   Total Bilirubin 0.8 0.3 - 1.2 mg/dL   GFR, Estimated >60 >60 mL/min   Anion gap 10 5 - 15  CBC     Status: None   Collection Time: 12/07/19  3:54 PM  Result Value Ref Range   WBC 6.5 4.0 - 10.5 K/uL   RBC 4.25 3.87 - 5.11 MIL/uL   Hemoglobin 12.9 12.0 - 15.0 g/dL   HCT 39.4 36 - 46 %   MCV 92.7 80.0 - 100.0 fL   MCH 30.4 26.0 - 34.0 pg   MCHC 32.7 30.0 - 36.0 g/dL   RDW 13.4 11.5 - 15.5 %   Platelets 240 150 - 400 K/uL   nRBC 0.0 0.0 - 0.2 %  D-dimer, quantitative (not at [facility])     Status: Abnormal   Collection Time: 12/07/19  3:54 PM  Result Value Ref Range   D-Dimer, Quant 2.04 (H) 0.00 - 0.50 ug/mL-FEU     Radiological Exams on Admission: DG Chest 2 View  Result Date: 12/07/2019 CLINICAL DATA:  Chest pain and shortness of breath. EXAM: CHEST - 2 VIEW COMPARISON:  12/05/2019 FINDINGS: The cardiac silhouette, mediastinal and hilar contours are within normal limits and stable.  Patchy peripheral nodularity is noted as demonstrated on the CT scan. No focal airspace consolidation or pleural effusion. The bony thorax is intact. IMPRESSION: Patchy peripheral nodularity as demonstrated on the CT scan. No focal airspace consolidation or pleural effusion. Electronically Signed   By: Marijo Sanes M.D.   On: 12/07/2019 16:11   CT Angio Chest W/Cm &/Or Wo Cm  Result Date: 12/06/2019 CLINICAL DATA:  Dyspnea on exertion, positive D-dimer. COVID-19 positive. EXAM: CT ANGIOGRAPHY CHEST WITH CONTRAST TECHNIQUE: Multidetector CT imaging of the chest was performed using the standard protocol during bolus administration of intravenous contrast. Multiplanar CT image reconstructions and MIPs were obtained to evaluate the vascular anatomy. CONTRAST:  174m OMNIPAQUE IOHEXOL 350 MG/ML SOLN COMPARISON:  November 05, 2016. FINDINGS: Cardiovascular: Atherosclerosis of thoracic aorta is noted without aneurysm or dissection. Filling defect is seen in lower lobe branch of right pulmonary artery consistent with acute pulmonary embolus. Normal cardiac size. No pericardial effusion. Mediastinum/Nodes: No enlarged mediastinal, hilar, or axillary lymph nodes. Thyroid gland, trachea, and esophagus demonstrate no significant findings. Lungs/Pleura: No pneumothorax or pleural effusion  is noted. Multiple ill-defined airspace opacities are noted in both lungs, but most prominently seen in the left upper and lower lobes, most consistent with multifocal pneumonia due to COVID-19. 8 mm subpleural nodule is noted in the right upper lobe laterally. Upper Abdomen: No acute abnormality. Musculoskeletal: No chest wall abnormality. No acute or significant osseous findings. Review of the MIP images confirms the above findings. IMPRESSION: 1. Filling defect is seen in lower lobe branch of right pulmonary artery consistent with acute pulmonary embolus. Critical Value/emergent results were called by telephone at the time of  interpretation on 12/06/2019 at 12:58 pm to provider JOSE PAZ , who verbally acknowledged these results. 2. Multiple ill-defined airspace opacities are noted in both lungs, but most prominently seen in the left upper and lower lobes, most consistent with multifocal pneumonia due to COVID-19. 3. 8 mm subpleural nodule is noted in the right upper lobe laterally. Non-contrast chest CT at 6-12 months is recommended. If the nodule is stable at time of repeat CT, then future CT at 18-24 months (from today's scan) is considered optional for low-risk patients, but is recommended for high-risk patients. This recommendation follows the consensus statement: Guidelines for Management of Incidental Pulmonary Nodules Detected on CT Images: From the Fleischner Society 2017; Radiology 2017; 284:228-243. Aortic Atherosclerosis (ICD10-I70.0). Electronically Signed   By: Marijo Conception M.D.   On: 12/06/2019 12:56   DG Chest 2 View  Final Result      Consults called:  None  EKG: Independently reviewed. Sinus tach with right bundle branch block noted   Dwyane Dee, MD [facility] 12/07/2019, 6:35 PM

## 2019-12-07 NOTE — Telephone Encounter (Signed)
yes

## 2019-12-07 NOTE — Assessment & Plan Note (Addendum)
-   provoked in setting of immobility at home since Covid diagnosis on 11/10/2019 as well as underlying hypercoagulability state from Covid itself -Patient started Eliquis on 12/06/2019.  She has had morning dose today.  Continue on Eliquis 10 mg twice daily -She needs a prescription assistance card; currently she cannot afford the current prescription sent to her pharmacy -Obtain echo: EF 60 to 99%, grade 1 diastolic dysfunction.  No right heart strain -She developed no hypoxia and tachycardia resolved after admission -She was given a prescription for Eliquis and Xarelto printed out and prescription cards for both for pharmacy to run her insurance on Monday and decide which medication is more affordable for her

## 2019-12-07 NOTE — Telephone Encounter (Addendum)
[name] is in [facility] I explained him what is going on with his mother (post Covid, pulmonary emboli) This morning he visit her and he thinks [name] is doing worse. We agreed  that she needs to go to the ER, he was somewhat concerned about the waiting time in the ER. Although I can't  guarantee him he is not goin to wait, I know that they are less busy than during the peak of the pandemia. [name] stated he will take her.

## 2019-12-07 NOTE — Hospital Course (Addendum)
[name] is a 74 yo CF with PMH HTN, chronic s/dCHF, nonischemic CM on cath 04/2017 Barrett's esophagus, IBS-D, osteopenia, OAB, DJD and whom also had COVID pna diagnosed on 11/10/19. She did not require hospitalization.  She was treated with a monoclonal antibody infusion on 11/13/2019, a Z-Pak, and supportive measures and recovered at home. She states that she has been hardly doing anything during her recovery and not getting out of bed much.  She notes over the past few days she became more short of breath, tachycardic at home.  She also had a slightly increased cough that was nonproductive. She has had some intermittent diarrhea that is consistent with her underlying IBS.  She denies any leg pain or swelling. She was evaluated outpatient by her PCP and had undergone D-dimer followed by CTA chest. This revealed a right lower lobe PE.  She had ongoing changes consistent with previous COVID-19 pneumonia.  There was also an 8 mm subpleural nodule in the right upper lobe noted. She started on a sample pack of Eliquis on 12/06/2019.  She did take her dose this morning.  Due to feeling more short of breath throughout today and increased heart rate, she ultimately presented to the ER for further evaluation.  Lab work-up was mostly unremarkable.  A D-dimer was checked and positive as expected.  Troponin also ordered and negative. Due to her underlying heart disease, recent COVID-19 pna, worsening shortness of breath, tachycardia, she is admitted for further observation and obtaining echo.  Vitals in the ER included: Temp 99.1, heart rate 120, respirations 19-25, BP 141/87, SPO2 97% on room air.   Her echo showed normal EF, 60 to 65% with grade 1 diastolic dysfunction.  Right ventricular size normal with no signs of right heart strain.  She was given prescription for Eliquis at discharge along with prescription assistance card.  Case management also met with patient prior to discharge and she was given a  printed out prescription for Xarelto as well in case Eliquis was too expensive at her pharmacy still.

## 2019-12-07 NOTE — Assessment & Plan Note (Signed)
-  Continue Entresto and home meds -Previous echo reviewed; repeat echo now in setting of acute PE

## 2019-12-07 NOTE — Assessment & Plan Note (Signed)
Continue PPI ?

## 2019-12-07 NOTE — ED Triage Notes (Signed)
Patient was diagnosed w/ COVID-19 October 1st (around there), went to PCP on Wednesday and called pt yesterday w/ results, CT showed a blood clot in her lungs, started on eliquis yesterday. Told to go to ED when she got the CT results.

## 2019-12-07 NOTE — Telephone Encounter (Signed)
Caller name: Legrand Como (son) Call back number: [phone]  Wants to speak to you in regards to his mom health.

## 2019-12-07 NOTE — ED Provider Notes (Signed)
[facility] DEPT Provider Note   CSN: [id number] Arrival date & time: 12/07/19  1516     History Chief Complaint  Patient presents with  . Shortness of Breath    [name] is a 74 y.o. female with PMH of HTN, HLD, fibromyalgia, CAD s/p cardiac catheterization, and recent COVID-19 diagnosis 11/10/2019 who presents to the ED for shortness of breath in context of newly diagnosed pulmonary embolism.  Patient informs me that she was never immunized for COVID-19 and tested positive earlier this month.  She then received an MAB infusion, but since then has continued to experience shortness of breath and "foggy brain".  She is accompanied by her daughter who is at bedside who states that she has been particularly short of breath as of late, worse with exertion.  Patient lives with her son who states that she cannot even ambulate to the bathroom without becoming profoundly short of breath and tachycardic.  She went to her primary care provider on 12/05/2019, Dr. Larose Kells, who obtained basic laboratory work-up and plain films of chest which demonstrated a persistent multifocal pneumonia.  He then added on a D-dimer given her exertional tachycardia in the office which resulted positive.  CTA of the chest the following day was notable for a filling defect in the lower lobe branch of right pulmonary artery consistent with acute PE.  It also redemonstrates the multiple ill-defined airspace opacities in both lungs compatible with COVID-19 pneumonia.  Given that she wanted to attempt to avoid hospitalization, Dr. Larose Kells started her on Eliquis 5 mg twice daily which she began yesterday.  However, she evidently had worsening dyspnea on exertion today which prompted them to come to the ED.  She is also endorsing a headache and intermittent nausea.  She denies any history of clots clotting disorder, recent unilateral extremity swelling or edema, hormone replacement therapy, or other risk  factors for clots.  She does endorse mild central chest "ache" which is worse with deep inspiration.  HPI     Past Medical History:  Diagnosis Date  . Allergy   . Anemia   . Anxiety   . Barrett's esophagus   . Bilateral carpal tunnel syndrome    Dr. Eddie Dibbles, having injection therapy  . C. difficile diarrhea 08/01/2012   severe 2014  . Carotid arterial disease ([place])   . Cataract    removed both eyes   . Chronic combined systolic and diastolic CHF (congestive heart failure) ([place])   . Chronic cystitis    Dr. Matilde Sprang  . Chronic lower back pain   . Chronic pain syndrome   . Depression   . Dizziness   . DJD (degenerative joint disease)    bilateral hands; knees  . Family history of anesthesia complication    Mother had severe N/V  . Fibromyalgia   . GERD (gastroesophageal reflux disease)   . H/O cardiac catheterization    (-) cath 12-2002  , cath again 2011 (-)  . History of colon polyps   . HTN (hypertension)   . Hyperlipidemia    past hx   . IBS (irritable bowel syndrome)   . Insomnia   . Migraine   . Morbid obesity ([place])   . Non-ischemic cardiomyopathy ([place])   . Osteoporosis    pt unsure of this  . RLS (restless legs syndrome)   . Vertigo   . Vitamin B 12 deficiency 04/09/2013    Patient Active Problem List   Diagnosis Date Noted  .  Cough 11/10/2019  . Gastric polyps   . Fibromyalgia, see office visit note from 02/14/2018 02/15/2018  . Paresthesia 12/29/2017  . Gait abnormality 09/21/2017  . Dizziness 09/21/2017  . Raynaud phenomenon 07/22/2017  . Nonischemic cardiomyopathy ([place] ) 05/18/2017  . Chronic combined systolic and diastolic CHF (congestive heart failure) ([place]) 05/05/2017  . Headache 05/05/2017  . Chest pain 05/04/2017  . Insomnia 10/23/2016  . Morbid obesity ([place]) 12/16/2015  . Myalgia 07/10/2015  . Hyperlipidemia 07/10/2015  . PCP NOTES >>>>>>>>>>>>>>>>> 10/31/2014  . Anxiety and depression 09/10/2013  . Vitamin B 12 deficiency 04/09/2013  .  Chronic cystitis 05/29/2012  . Annual physical exam 06/14/2011  . PARESTHESIA 04/01/2009  . DJD (degenerative joint disease) 10/21/2008  . BARRETTS ESOPHAGUS 07/19/2007  . RESTLESS LEGS SYNDROME 05/11/2007  . Migraine-- on topamax, rx by [facility] 07/27/2006  . Hypertension 07/27/2006  . Osteopenia 07/27/2006    Past Surgical History:  Procedure Laterality Date  . Arm surgery Left    "don't remember what they did; arm wasn't broken"  . BILATERAL KNEE ARTHROSCOPY Bilateral   . BIOPSY  08/07/2018   Procedure: BIOPSY;  Surgeon: Ladene Artist, MD;  Location: [facility];  Service: Endoscopy;;  . CARDIAC CATHETERIZATION  01/22/10   clean cath  . CATARACT EXTRACTION W/ INTRAOCULAR LENS  IMPLANT, BILATERAL Bilateral   . CHOLECYSTECTOMY N/A 10/25/2016   Procedure: LAPAROSCOPIC CHOLECYSTECTOMY;  Surgeon: Georganna Skeans, MD;  Location: [facility];  Service: General;  Laterality: N/A;  . COLONOSCOPY    . COLONOSCOPY W/ POLYPECTOMY    . ESOPHAGOGASTRODUODENOSCOPY (EGD) WITH PROPOFOL N/A 08/07/2018   Procedure: ESOPHAGOGASTRODUODENOSCOPY (EGD) WITH PROPOFOL;  Surgeon: Ladene Artist, MD;  Location: [facility];  Service: Endoscopy;  Laterality: N/A;  . FOOT SURGERY Bilateral    toenails removed; callus removed on right; hammertoes right"  . Knot     "removed from right neck; not a goiter"  . LUMBAR DISC SURGERY     L5 S1 anterior fusion  . OOPHORECTOMY    . RIGHT/LEFT HEART CATH AND CORONARY ANGIOGRAPHY N/A 05/06/2017   Procedure: RIGHT/LEFT HEART CATH AND CORONARY ANGIOGRAPHY;  Surgeon: Nelva Bush, MD;  Location: [facility];  Service: Cardiovascular;  Laterality: N/A;  . SHOULDER ARTHROSCOPY Right   . TOTAL KNEE ARTHROPLASTY  10/05/2011   Procedure: TOTAL KNEE ARTHROPLASTY;  Surgeon: Hessie Dibble, MD;  Location: [facility];  Service: Orthopedics;  Laterality: Right;  . UPPER GASTROINTESTINAL ENDOSCOPY    . VAGINAL HYSTERECTOMY     for endometriosis     OB History   No  obstetric history on file.     Family History  Problem Relation Age of Onset  . Lung cancer Mother   . Dementia Father   . CAD Father        dx in his 79s  . Stroke Father   . Congestive Heart Failure Father   . Colon cancer Maternal Grandfather   . Esophageal cancer Brother   . Breast cancer Sister   . Diabetes Other        Aunt  . Colon cancer Maternal Uncle        2  . Hepatitis C Brother   . Rectal cancer Neg Hx   . Stomach cancer Neg Hx   . Colon polyps Neg Hx     Social History   Tobacco Use  . Smoking status: Former Smoker    Packs/day: 0.50    Years: 10.00    Pack years: 5.00  Quit date: 02/09/1975    Years since quitting: 44.8  . Smokeless tobacco: Never Used  . Tobacco comment: started at age 39.   quit in the 49s.  Vaping Use  . Vaping Use: Never used  Substance Use Topics  . Alcohol use: Not Currently    Alcohol/week: 0.0 standard drinks    Comment: 2 glasses of wine per year  . Drug use: No    Comment: CBD and hemp OIL     Home Medications Prior to Admission medications   Medication Sig Start Date End Date Taking? Authorizing Provider  albuterol (VENTOLIN HFA) 108 (90 Base) MCG/ACT inhaler Inhale 2 puffs into the lungs every 6 (six) hours as needed for wheezing (Chest congestion). 11/27/19  Yes Paz, Alda Berthold, MD  Apixaban Starter Pack, 84m and 5613m (ELIQUIS DVT/PE STARTER PACK) Take 1 tablet by mouth See admin instructions. Take as directed on package: start with two-13m81mablets twice daily for 7 days. On day 8, switch to one-13mg28mblet twice daily.   Yes [provider]  aspirin EC 81 MG tablet Take 81 mg by mouth daily.   Yes [provider]  cholecalciferol (VITAMIN D) 1000 units tablet Take 1,000 Units by mouth daily.   Yes [provider]  dicyclomine (BENTYL) 20 MG tablet Take 1 tablet (20 mg total) by mouth 4 (four) times daily -  before meals and at bedtime. 02/13/19  Yes StarLadene Artist   HYDROcodone-acetaminophen (NORCO) 10-325 MG tablet Take 1-2 tablets by mouth every 4 (four) hours as needed for moderate pain.    Yes [provider]  metoprolol succinate (TOPROL-XL) 50 MG 24 hr tablet Take 0.5 tablets (25 mg total) by mouth daily. 10/29/19  Yes Paz, JoseAlda Berthold  NARCAN 4 MG/0.1ML LIQD nasal spray kit Place 1 spray into the nose once as needed (opioid od).  04/24/18  Yes [provider]  nitroGLYCERIN (NITROSTAT) 0.4 MG SL tablet Place 1 tablet (0.4 mg total) under the tongue every 5 (five) minutes as needed for chest pain. 05/08/17  Yes AlekAline August  omeprazole (PRILOSEC) 40 MG capsule Take 1 capsule (40 mg total) by mouth 2 (two) times daily. 03/14/19  Yes StarLadene Artist  rOPINIRole (REQUIP) 1 MG tablet TAKE 1 TABLET BY MOUTH TWICE A DAY Patient taking differently: Take 1 mg by mouth in the morning and at bedtime.  10/29/19  Yes Lowne Chase, Yvonne R, DO  sacubitril-valsartan (ENTRESTO) 97-103 MG Take 1 tablet by mouth 2 (two) times daily. 12/07/19  Yes SkaiJerline Pain  sertraline (ZOLOFT) 50 MG tablet TAKE 1 AND 1/2 TABLETS DAILY BY MOUTH Patient taking differently: Take 75 mg by mouth daily.  10/29/19  Yes LownRoma SchanzDO  traZODone (DESYREL) 50 MG tablet TAKE 1-2 TABLETS (50-100 MG TOTAL) BY MOUTH AT BEDTIME. Patient taking differently: Take 50-100 mg by mouth at bedtime. Take one tablet by mouth to help sleep, if still can not go to sleep, will take another tablet by mouth. 05/09/19  Yes LownCarollee HerteronAlferd Apa  apixaban (ELIQUIS) 5 MG TABS tablet Take 1 tablet (5 mg total) by mouth 2 (two) times daily. Patient not taking: Reported on 12/07/2019 12/06/19   Paz,Colon Branch  HYDROcodone-homatropine ([facility]1.5 MG/5ML syrup Take 5 mLs by mouth 3 (three) times daily as needed for cough. Patient not taking: Reported on 12/05/2019 11/27/19   Paz,Colon Branch  meclizine (ANTIVERT) 12.5 MG tablet Take  1-2 tablets (12.5-25 mg total) by mouth 3  (three) times daily as needed for dizziness. Patient not taking: Reported on 11/27/2019 09/09/17   Colon Branch, MD  ondansetron (ZOFRAN) 8 MG tablet Take 1 tablet (8 mg total) by mouth every 8 (eight) hours as needed for nausea or vomiting. Patient not taking: Reported on 11/27/2019 11/12/19   Colon Branch, MD  promethazine (PHENERGAN) 25 MG tablet Take 1 tablet (25 mg total) by mouth every 8 (eight) hours as needed for nausea or vomiting. Patient not taking: Reported on 12/07/2019 11/19/19   Colon Branch, MD    Allergies    Dextromethorphan-guaifenesin, Morphine, Gabapentin, and Penicillins  Review of Systems   Review of Systems  All other systems reviewed and are negative.   Physical Exam Updated Vital Signs BP 136/76 (BP Location: Right Arm)   Pulse (!) 102   Temp 99.1 F (37.3 C) (Oral)   Resp 16   Ht _0  (1.676 m)   Wt 105 kg   SpO2 97%   BMI 37.36 kg/m   Physical Exam Vitals and nursing note reviewed. Exam conducted with a chaperone present.  Constitutional:      Appearance: Normal appearance.  HENT:     Head: Normocephalic and atraumatic.  Eyes:     General: No scleral icterus.    Conjunctiva/sclera: Conjunctivae normal.  Cardiovascular:     Rate and Rhythm: Regular rhythm. Tachycardia present.  Pulmonary:     Effort: No respiratory distress.     Breath sounds: Rales present.     Comments: No significant increased work of breathing while at rest.  Mild rales noted in bilateral lung bases.   Musculoskeletal:     Cervical back: Normal range of motion. No rigidity.     Comments: Mild 1+ edema bilaterally.  No asymmetries.  Mild TTP over the calves bilaterally.  No overlying skin changes.  Pedal pulses intact and symmetric.  Skin:    General: Skin is dry.     Capillary Refill: Capillary refill takes less than 2 seconds.  Neurological:     Mental Status: She is alert.     GCS: GCS eye subscore is 4. GCS verbal subscore is 5. GCS motor subscore is 6.  Psychiatric:         Mood and Affect: Mood normal.        Behavior: Behavior normal.        Thought Content: Thought content normal.     ED Results / Procedures / Treatments   Labs (all labs ordered are listed, but only abnormal results are displayed) Labs Reviewed  COMPREHENSIVE METABOLIC PANEL - Abnormal; Notable for the following components:      Result Value   Potassium 3.3 (*)    Glucose, Bld 107 (*)    Calcium 8.3 (*)    All other components within normal limits  D-DIMER, QUANTITATIVE (NOT AT [facility]) - Abnormal; Notable for the following components:   D-Dimer, Quant 2.04 (*)    All other components within normal limits  CBC  URINALYSIS, ROUTINE W REFLEX MICROSCOPIC  BRAIN NATRIURETIC PEPTIDE  TROPONIN I (HIGH SENSITIVITY)    EKG EKG Interpretation  Date/Time:  Friday December 07 2019 15:30:58 EDT Ventricular Rate:  117 PR Interval:    QRS Duration: 144 QT Interval:  355 QTC Calculation: 496 R Axis:   19 Text Interpretation: Sinus tachycardia Right bundle branch block 12 Lead; Mason-Likar No STEMI Confirmed by Octaviano Glow [phone]) on 12/07/2019 5:13:37 PM  Radiology DG Chest 2 View  Result Date: 12/07/2019 CLINICAL DATA:  Chest pain and shortness of breath. EXAM: CHEST - 2 VIEW COMPARISON:  12/05/2019 FINDINGS: The cardiac silhouette, mediastinal and hilar contours are within normal limits and stable. Patchy peripheral nodularity is noted as demonstrated on the CT scan. No focal airspace consolidation or pleural effusion. The bony thorax is intact. IMPRESSION: Patchy peripheral nodularity as demonstrated on the CT scan. No focal airspace consolidation or pleural effusion. Electronically Signed   By: Marijo Sanes M.D.   On: 12/07/2019 16:11   CT Angio Chest W/Cm &/Or Wo Cm  Result Date: 12/06/2019 CLINICAL DATA:  Dyspnea on exertion, positive D-dimer. COVID-19 positive. EXAM: CT ANGIOGRAPHY CHEST WITH CONTRAST TECHNIQUE: Multidetector CT imaging of the chest was performed using  the standard protocol during bolus administration of intravenous contrast. Multiplanar CT image reconstructions and MIPs were obtained to evaluate the vascular anatomy. CONTRAST:  119m OMNIPAQUE IOHEXOL 350 MG/ML SOLN COMPARISON:  November 05, 2016. FINDINGS: Cardiovascular: Atherosclerosis of thoracic aorta is noted without aneurysm or dissection. Filling defect is seen in lower lobe branch of right pulmonary artery consistent with acute pulmonary embolus. Normal cardiac size. No pericardial effusion. Mediastinum/Nodes: No enlarged mediastinal, hilar, or axillary lymph nodes. Thyroid gland, trachea, and esophagus demonstrate no significant findings. Lungs/Pleura: No pneumothorax or pleural effusion is noted. Multiple ill-defined airspace opacities are noted in both lungs, but most prominently seen in the left upper and lower lobes, most consistent with multifocal pneumonia due to COVID-19. 8 mm subpleural nodule is noted in the right upper lobe laterally. Upper Abdomen: No acute abnormality. Musculoskeletal: No chest wall abnormality. No acute or significant osseous findings. Review of the MIP images confirms the above findings. IMPRESSION: 1. Filling defect is seen in lower lobe branch of right pulmonary artery consistent with acute pulmonary embolus. Critical Value/emergent results were called by telephone at the time of interpretation on 12/06/2019 at 12:58 pm to provider JOSE PAZ , who verbally acknowledged these results. 2. Multiple ill-defined airspace opacities are noted in both lungs, but most prominently seen in the left upper and lower lobes, most consistent with multifocal pneumonia due to COVID-19. 3. 8 mm subpleural nodule is noted in the right upper lobe laterally. Non-contrast chest CT at 6-12 months is recommended. If the nodule is stable at time of repeat CT, then future CT at 18-24 months (from today's scan) is considered optional for low-risk patients, but is recommended for high-risk patients.  This recommendation follows the consensus statement: Guidelines for Management of Incidental Pulmonary Nodules Detected on CT Images: From the Fleischner Society 2017; Radiology 2017; 284:228-243. Aortic Atherosclerosis (ICD10-I70.0). Electronically Signed   By: JMarijo ConceptionM.D.   On: 12/06/2019 12:56    Procedures Procedures (including critical care time)  Medications Ordered in ED Medications - No data to display  ED Course  I have reviewed the triage vital signs and the nursing notes.  Pertinent labs & imaging results that were available during my care of the patient were reviewed by me and considered in my medical decision making (see chart for details).  Clinical Course as of Dec 06 1749  Fri Dec 07, 2019  1743 74yo female w/ hx of CHF, obesity, presenting to ED with dyspnea and SOB.  Patient diagnosed with covid 1 week ago, but reports having covid-type symptoms since October 1st.  She had a CT PE yesterday positive for right lobar PE, and was started on eliquis.  She took her dose yesterday  and this morning.  She presents with worsening dyspnea x 1 week, difficulty even getting to the bathroom.  Labs show no significant CMP abnormalities (mild HypoK), Ddimer leevated, trop pending.  She is mildly tachycardic on exam, RR 20, 98% on room air.  Plan for medical admission for echocardiogram and DVT ultrasound of LE - I would continue eliquis for now.  If this workup is unremarkable, I discussed with family that she would likely be discharged home tomorrow.  She lives with her daughter who is helping her at home.  She is not requiring oxygen at this time.  Pt and family agreeable with this plan.   [MT]  [place] with Dr. Sabino Gasser who will admit patient.   [GG]    Clinical Course User Index [GG] Corena Herter, PA-C [MT] Wyvonnia Dusky, MD   MDM Rules/Calculators/A&P                          Patient is endorsing exertional dyspnea and exertional tachycardia in the context of  recently diagnosed acute right lower lobe pulmonary artery pulmonary embolus.  Will obtain troponin to assess for right heart strain.  Basic laboratory work-up and plan films of chest have been ordered while patient was in triage prior to my examination.    Her and her primary care provider attempted to manage her symptoms and acute pulmonary embolism on outpatient basis with 5 mg Eliquis twice daily, however according to patient and her daughter she has failed outpatient management as she is having worsening dyspnea and tachycardia at home with any form of exertion.  Her son reported to her daughter that she cannot even ambulate to the bathroom without becoming significantly short of breath.  She also has a multifocal pneumonia seen on plain films of chest.  Patient score of 105 points places her at intermediate risk for 30-day mortality.  I feel as though admission to hospitalist services for DVT work-up and echocardiogram to evaluate for right heart strain is reasonable given her pulmonary embolism and worsening exertional shortness of breath/tachycardia.  Respiratory panel not obtained given recent COVID-19 diagnosis.  Positive COVID-19 testing can be seen in recent labs from 11/10/2019.    Rakia Frayne Ludke was evaluated in Emergency Department on 12/07/2019 for the symptoms described in the history of present illness. She was evaluated in the context of the global COVID-19 pandemic, which necessitated consideration that the patient might be at risk for infection with the SARS-CoV-2 virus that causes COVID-19. Institutional protocols and algorithms that pertain to the evaluation of patients at risk for COVID-19 are in a state of rapid change based on information released by regulatory bodies including the CDC and federal and state organizations. These policies and algorithms were followed during the patient's care in the ED.  Spoke with Dr. Sabino Gasser who will admit patient.  Final Clinical Impression(s) /  ED Diagnoses Final diagnoses:  Acute pulmonary embolism without acute cor pulmonale, unspecified pulmonary embolism type ([place])  Pneumonia due to COVID-19 virus    Rx / DC Orders ED Discharge Orders    None       Corena Herter, PA-C 12/07/19 1751    Wyvonnia Dusky, MD 12/07/19 2310

## 2019-12-08 ENCOUNTER — Observation Stay (HOSPITAL_BASED_OUTPATIENT_CLINIC_OR_DEPARTMENT_OTHER): Payer: Medicare Other

## 2019-12-08 DIAGNOSIS — I2699 Other pulmonary embolism without acute cor pulmonale: Secondary | ICD-10-CM

## 2019-12-08 DIAGNOSIS — I5042 Chronic combined systolic (congestive) and diastolic (congestive) heart failure: Secondary | ICD-10-CM | POA: Diagnosis not present

## 2019-12-08 LAB — CBC
HCT: 34 % — ABNORMAL LOW (ref 36.0–46.0)
Hemoglobin: 11.1 g/dL — ABNORMAL LOW (ref 12.0–15.0)
MCH: 30.5 pg (ref 26.0–34.0)
MCHC: 32.6 g/dL (ref 30.0–36.0)
MCV: 93.4 fL (ref 80.0–100.0)
Platelets: 208 10*3/uL (ref 150–400)
RBC: 3.64 MIL/uL — ABNORMAL LOW (ref 3.87–5.11)
RDW: 13.3 % (ref 11.5–15.5)
WBC: 5.8 10*3/uL (ref 4.0–10.5)
nRBC: 0 % (ref 0.0–0.2)

## 2019-12-08 LAB — BASIC METABOLIC PANEL
Anion gap: 8 (ref 5–15)
BUN: 10 mg/dL (ref 8–23)
CO2: 26 mmol/L (ref 22–32)
Calcium: 8.3 mg/dL — ABNORMAL LOW (ref 8.9–10.3)
Chloride: 106 mmol/L (ref 98–111)
Creatinine, Ser: 0.8 mg/dL (ref 0.44–1.00)
GFR, Estimated: >60 mL/min (ref >60)
Glucose, Bld: 106 mg/dL — ABNORMAL HIGH (ref 70–99)
Potassium: 3.3 mmol/L — ABNORMAL LOW (ref 3.5–5.1)
Sodium: 140 mmol/L (ref 135–145)

## 2019-12-08 LAB — ECHOCARDIOGRAM COMPLETE
Area-P 1/2: 2.2 cm2
Height: 66 in
P 1/2 time: 506 ms
S' Lateral: 3 cm
Weight: 3763.69 [oz_av]

## 2019-12-08 MED ORDER — ELIQUIS DVT/PE STARTER PACK 5 MG PO TBPK
1.0000 | ORAL_TABLET | ORAL | 0 refills | Status: DC
Start: 2019-12-08 — End: 2020-01-15

## 2019-12-08 MED ORDER — POTASSIUM CHLORIDE CRYS ER 20 MEQ PO TBCR
40.0000 meq | EXTENDED_RELEASE_TABLET | Freq: Once | ORAL | Status: AC
  Administered 2019-12-08: 40 meq via ORAL
  Filled 2019-12-08: qty 2

## 2019-12-08 MED ORDER — RIVAROXABAN (XARELTO) VTE STARTER PACK (15 & 20 MG)
ORAL_TABLET | ORAL | 0 refills | Status: DC
Start: 2019-12-08 — End: 2019-12-11

## 2019-12-08 MED ORDER — PERFLUTREN LIPID MICROSPHERE
1.0000 mL | INTRAVENOUS | Status: AC | PRN
  Administered 2019-12-08: 3 mL via INTRAVENOUS
  Filled 2019-12-08: qty 10

## 2019-12-08 NOTE — Discharge Instructions (Signed)
Information on my medicine - ELIQUIS (apixaban) Why was Eliquis prescribed for you? Eliquis was prescribed to treat blood clots that may have been found in the veins of your legs (deep vein thrombosis) or in your lungs (pulmonary embolism) and to reduce the risk of them occurring again.  What do You need to know about Eliquis ? The starting dose is 10 mg (two 5 mg tablets) taken TWICE daily for the FIRST SEVEN (7) DAYS, then on 12/14/19  the dose is reduced to ONE 5 mg tablet taken TWICE daily.  Eliquis may be taken with or without food.   Try to take the dose about the same time in the morning and in the evening. If you have difficulty swallowing the tablet whole please discuss with your pharmacist how to take the medication safely.  Take Eliquis exactly as prescribed and DO NOT stop taking Eliquis without talking to the doctor who prescribed the medication.  Stopping may increase your risk of developing a new blood clot.  Refill your prescription before you run out.  After discharge, you should have regular check-up appointments with your healthcare provider that is prescribing your Eliquis.    What do you do if you miss a dose? If a dose of ELIQUIS is not taken at the scheduled time, take it as soon as possible on the same day and twice-daily administration should be resumed. The dose should not be doubled to make up for a missed dose.  Important Safety Information A possible side effect of Eliquis is bleeding. You should call your healthcare provider right away if you experience any of the following: ? Bleeding from an injury or your nose that does not stop. ? Unusual colored urine (red or dark brown) or unusual colored stools (red or black). ? Unusual bruising for unknown reasons. ? A serious fall or if you hit your head (even if there is no bleeding).  Some medicines may interact with Eliquis and might increase your risk of bleeding or clotting while on Eliquis. To help avoid  this, consult your healthcare provider or pharmacist prior to using any new prescription or non-prescription medications, including herbals, vitamins, non-steroidal anti-inflammatory drugs (NSAIDs) and supplements.  This website has more information on Eliquis (apixaban): http://www.eliquis.com/eliquis/home

## 2019-12-08 NOTE — Progress Notes (Signed)
  Echocardiogram 2D Echocardiogram has been performed.  Jennette Dubin 12/08/2019, 8:55 AM

## 2019-12-08 NOTE — Progress Notes (Signed)
Met with pt to discuss D/C plan. She has been D/C today. She plans to return home with the support of her son who lives with her. She reports that she drives. She has insurance coverage for her meds. She denies any issues filling her prescriptions. Discussed prescription for Eliquis. Pharmacist provided pt with a 30 day free and $10 copay cards. She reports that she gets her meds through [facility]. Explained to pt that she can use the 30 day free card today and the $10 copay card is for [employer]. Encouraged pt to ask the pharmacist at [facility] if she can use the $10 copay card. Informed pt that on the weekends we can't contact insurance to check on coverage and co-payments for meds. Encouraged pt to contact [facility] on Monday to check on her co-payment for Eliquis.

## 2019-12-08 NOTE — Care Management Obs Status (Signed)
[facility] NOTIFICATION   Patient Details  Name: HIDEKO ESSELMAN MRN: [id number] Date of Birth: 06/04/1945   Medicare Observation Status Notification Given:  Yes    Norina Buzzard, RN 12/08/2019, 10:15 AM

## 2019-12-08 NOTE — Discharge Summary (Signed)
Physician Discharge Summary   [name] [facility] MVV:[id number] DOB: 1945/12/27 DOA: 12/07/2019  PCP: Colon Branch, MD  Admit date: 12/07/2019 Discharge date: 12/08/2019  Admitted From: Home Disposition: Home Admitting physician: Dwyane Dee, MD Discharging physician: Dwyane Dee, MD  Recommendations for Outpatient Follow-up:  1. Patient given prescription for both Eliquis and Xarelto for insurance check and affordability 2. Continue outpatient management for PE with course length to be decided after approximately 3 months   Patient discharged to home in Discharge Condition: stable CODE STATUS: Full Diet recommendation:  Diet Orders (From admission, onward)    Start     Ordered   12/08/19 0000  Diet - low sodium heart healthy        12/08/19 0914   12/07/19 1913  Diet Heart Room service appropriate? Yes; Fluid consistency: Thin  Diet effective now       Question Answer Comment  Room service appropriate? Yes   Fluid consistency: Thin      12/07/19 1912          Hospital Course: [name] is a 74 yo CF with PMH HTN, chronic s/dCHF, nonischemic CM on cath 04/2017 Barrett's esophagus, IBS-D, osteopenia, OAB, DJD and whom also had COVID pna diagnosed on 11/10/19. She did not require hospitalization.  She was treated with a monoclonal antibody infusion on 11/13/2019, a Z-Pak, and supportive measures and recovered at home. She states that she has been hardly doing anything during her recovery and not getting out of bed much.  She notes over the past few days she became more short of breath, tachycardic at home.  She also had a slightly increased cough that was nonproductive. She has had some intermittent diarrhea that is consistent with her underlying IBS.  She denies any leg pain or swelling. She was evaluated outpatient by her PCP and had undergone D-dimer followed by CTA chest. This revealed a right lower lobe PE.  She had ongoing changes consistent with previous COVID-19 pneumonia.   There was also an 8 mm subpleural nodule in the right upper lobe noted. She started on a sample pack of Eliquis on 12/06/2019.  She did take her dose this morning.  Due to feeling more short of breath throughout today and increased heart rate, she ultimately presented to the ER for further evaluation.  Lab work-up was mostly unremarkable.  A D-dimer was checked and positive as expected.  Troponin also ordered and negative. Due to her underlying heart disease, recent COVID-19 pna, worsening shortness of breath, tachycardia, she is admitted for further observation and obtaining echo.  Vitals in the ER included: Temp 99.1, heart rate 120, respirations 19-25, BP 141/87, SPO2 97% on room air.   Her echo showed normal EF, 60 to 65% with grade 1 diastolic dysfunction.  Right ventricular size normal with no signs of right heart strain.  She was given prescription for Eliquis at discharge along with prescription assistance card.  Case management also met with patient prior to discharge and she was given a printed out prescription for Xarelto as well in case Eliquis was too expensive at her pharmacy still.   * Pulmonary embolism ([place]) - provoked in setting of immobility at home since Covid diagnosis on 11/10/2019 as well as underlying hypercoagulability state from Covid itself -Patient started Eliquis on 12/06/2019.  She has had morning dose today.  Continue on Eliquis 10 mg twice daily -She needs a prescription assistance card; currently she cannot afford the current prescription sent to her pharmacy -Obtain  echo: EF 60 to 48%, grade 1 diastolic dysfunction.  No right heart strain -She developed no hypoxia and tachycardia resolved after admission -She was given a prescription for Eliquis and Xarelto printed out and prescription cards for both for pharmacy to run her insurance on Monday and decide which medication is more affordable for her  Chronic combined systolic and diastolic CHF (congestive heart  failure) ([place]) -Continue Entresto and home meds -Previous echo reviewed; repeat echo now in setting of acute PE  BARRETTS ESOPHAGUS -Continue PPI    The patient's chronic medical conditions were treated accordingly per the patient's home medication regimen except as noted.  On day of discharge, patient was felt deemed stable for discharge. Patient/family member advised to call PCP or come back to ER if needed.   Principal Diagnosis: Pulmonary embolism [facility])  Discharge Diagnoses: Active Hospital Problems   Diagnosis Date Noted  . Pulmonary embolism ([place]) 12/07/2019    Priority: High  . Chronic combined systolic and diastolic CHF (congestive heart failure) ([place]) 05/05/2017    Priority: Medium  . BARRETTS ESOPHAGUS 07/19/2007    Resolved Hospital Problems  No resolved problems to display.    Discharge Instructions    Diet - low sodium heart healthy   Complete by: As directed    Increase activity slowly   Complete by: As directed      Allergies as of 12/08/2019      Reactions   Dextromethorphan-guaifenesin Nausea And Vomiting   Morphine Nausea And Vomiting   Gabapentin Other (See Comments)   Dizziness, made her feel loopy   Penicillins Rash   "> 30 years ago; best I can remember it was just a light rash on my arm" Did it involve swelling of the face/tongue/throat, SOB, or low BP? No Did it involve sudden or severe rash/hives, skin peeling, or any reaction on the inside of your mouth or nose? Unknown Did you need to seek medical attention at a hospital or doctor's office? No When did it last happen?more than 30 years  If all above answers are "NO", may proceed with cephalosporin use.      Medication List    TAKE these medications   albuterol 108 (90 Base) MCG/ACT inhaler Commonly known as: VENTOLIN HFA Inhale 2 puffs into the lungs every 6 (six) hours as needed for wheezing (Chest congestion).   aspirin EC 81 MG tablet Take 81 mg by mouth daily.    cholecalciferol 1000 units tablet Commonly known as: VITAMIN D Take 1,000 Units by mouth daily.   dicyclomine 20 MG tablet Commonly known as: BENTYL Take 1 tablet (20 mg total) by mouth 4 (four) times daily -  before meals and at bedtime.   Eliquis DVT/PE Starter Pack Generic drug: Apixaban Starter Pack (41m and 56m Take 1 tablet by mouth See admin instructions. Take as directed on package: start with two-77m26mablets twice daily for 7 days. On day 8, switch to one-77mg43mblet twice daily.   Entresto 97-103 MG Generic drug: sacubitril-valsartan Take 1 tablet by mouth 2 (two) times daily.   metoprolol succinate 50 MG 24 hr tablet Commonly known as: TOPROL-XL Take 0.5 tablets (25 mg total) by mouth daily.   Narcan 4 MG/0.1ML Liqd nasal spray kit Generic drug: naloxone Place 1 spray into the nose once as needed (opioid od).   nitroGLYCERIN 0.4 MG SL tablet Commonly known as: NITROSTAT Place 1 tablet (0.4 mg total) under the tongue every 5 (five) minutes as needed for chest pain.  omeprazole 40 MG capsule Commonly known as: PRILOSEC Take 1 capsule (40 mg total) by mouth 2 (two) times daily.   Rivaroxaban Stater Pack (15 mg and 20 mg) Commonly known as: XARELTO STARTER PACK Follow package directions: Take one 96m tablet by mouth twice a day. On day 22, switch to one 285mtablet once a day. Take with food.   rOPINIRole 1 MG tablet Commonly known as: REQUIP TAKE 1 TABLET BY MOUTH TWICE A DAY What changed: when to take this   sertraline 50 MG tablet Commonly known as: ZOLOFT TAKE 1 AND 1/2 TABLETS DAILY BY MOUTH What changed: See the new instructions.   traZODone 50 MG tablet Commonly known as: DESYREL TAKE 1-2 TABLETS (50-100 MG TOTAL) BY MOUTH AT BEDTIME. What changed: additional instructions       Allergies  Allergen Reactions  . Dextromethorphan-Guaifenesin Nausea And Vomiting  . Morphine Nausea And Vomiting  . Gabapentin Other (See Comments)    Dizziness,  made her feel loopy  . Penicillins Rash    "> 30 years ago; best I can remember it was just a light rash on my arm" Did it involve swelling of the face/tongue/throat, SOB, or low BP? No Did it involve sudden or severe rash/hives, skin peeling, or any reaction on the inside of your mouth or nose? Unknown Did you need to seek medical attention at a hospital or doctor's office? No When did it last happen?more than 30 years  If all above answers are "NO", may proceed with cephalosporin use.     Consultations: None  Discharge Exam: BP (!) 152/68 (BP Location: Left Arm)   Pulse 70   Temp 98.6 F (37 C) (Oral)   Resp 20   Ht '5\' 6"'  (1.676 m)   Wt 106.7 kg   SpO2 95%   BMI 37.97 kg/m  General appearance: alert, cooperative and no distress Head: Normocephalic, without obvious abnormality, atraumatic Eyes: EOMI Lungs: Scattered coarse sounds bilaterally Heart:  Regular rate, regular rhythm, S1-S2 present Abdomen: normal findings: bowel sounds normal and soft, non-tender Extremities: No tenderness.  Chronic soft swelling without asymmetry.  No palpable cord.  Negative Homans' sign Skin: mobility and turgor normal Neurologic: Grossly normal  The results of significant diagnostics from this hospitalization (including imaging, microbiology, ancillary and laboratory) are listed below for reference.   Microbiology: Recent Results (from the past 240 hour(s))  Respiratory Panel by RT PCR (Flu A&B, Covid) - Nasopharyngeal Swab     Status: None   Collection Time: 12/07/19  7:14 PM   Specimen: Nasopharyngeal Swab  Result Value Ref Range Status   SARS Coronavirus 2 by RT PCR NEGATIVE NEGATIVE Final    Comment: (NOTE) SARS-CoV-2 target nucleic acids are NOT DETECTED.  The SARS-CoV-2 RNA is generally detectable in upper respiratoy specimens during the acute phase of infection. The lowest concentration of SARS-CoV-2 viral copies this assay can detect is 131 copies/mL. A negative result  does not preclude SARS-Cov-2 infection and should not be used as the sole basis for treatment or other patient management decisions. A negative result may occur with  improper specimen collection/handling, submission of specimen other than nasopharyngeal swab, presence of viral mutation(s) within the areas targeted by this assay, and inadequate number of viral copies (<131 copies/mL). A negative result must be combined with clinical observations, patient history, and epidemiological information. The expected result is Negative.  Fact Sheet for Patients:  htPinkCheek.beFact Sheet for Healthcare Providers:  htGravelBags.itThis test is no t  yet approved or cleared by the [place] and  has been authorized for detection and/or diagnosis of SARS-CoV-2 by FDA under an Emergency Use Authorization (EUA). This EUA will remain  in effect (meaning this test can be used) for the duration of the COVID-19 declaration under Section 564(b)(1) of the Act, 21 U.S.C. section 360bbb-3(b)(1), unless the authorization is terminated or revoked sooner.     Influenza A by PCR NEGATIVE NEGATIVE Final   Influenza B by PCR NEGATIVE NEGATIVE Final    Comment: (NOTE) The Xpert Xpress SARS-CoV-2/FLU/RSV assay is intended as an aid in  the diagnosis of influenza from Nasopharyngeal swab specimens and  should not be used as a sole basis for treatment. Nasal washings and  aspirates are unacceptable for Xpert Xpress SARS-CoV-2/FLU/RSV  testing.  Fact Sheet for Patients: PinkCheek.be  Fact Sheet for Healthcare Providers: GravelBags.it  This test is not yet approved or cleared by the [place] FDA and  has been authorized for detection and/or diagnosis of SARS-CoV-2 by  FDA under an Emergency Use Authorization (EUA). This EUA will remain  in effect (meaning this test can be used) for  the duration of the  Covid-19 declaration under Section 564(b)(1) of the Act, 21  U.S.C. section 360bbb-3(b)(1), unless the authorization is  terminated or revoked. Performed at [facility], [place] [address], [place], [place] [address]      Labs: BNP (last 3 results) Recent Labs    12/05/19 1416 12/07/19 1554  BNP 82 01.0   Basic Metabolic Panel: Recent Labs  Lab 12/05/19 1416 12/07/19 1554 12/08/19 0402  NA 139 138 140  K 4.0 3.3* 3.3*  CL 104 105 106  CO2 '26 23 26  ' GLUCOSE 102* 107* 106*  BUN '12 8 10  ' CREATININE 0.80 0.80 0.80  CALCIUM 8.4* 8.3* 8.3*   Liver Function Tests: Recent Labs  Lab 12/05/19 1416 12/07/19 1554  AST 14 19  ALT 10 14  ALKPHOS  --  67  BILITOT 0.6 0.8  PROT 6.7 7.3  ALBUMIN  --  3.7   No results for input(s): LIPASE, AMYLASE in the last 168 hours. No results for input(s): AMMONIA in the last 168 hours. CBC: Recent Labs  Lab 12/05/19 1416 12/07/19 1554 12/08/19 0402  WBC 6.8 6.5 5.8  NEUTROABS 4,379  --   --   HGB 12.9 12.9 11.1*  HCT 38.7 39.4 34.0*  MCV 91.5 92.7 93.4  PLT 232 240 208   Cardiac Enzymes: No results for input(s): CKTOTAL, CKMB, CKMBINDEX, TROPONINI in the last 168 hours. BNP: Invalid input(s): POCBNP CBG: No results for input(s): GLUCAP in the last 168 hours. D-Dimer Recent Labs    12/06/19 0835 12/07/19 1554  DDIMER 5.33* 2.04*   Hgb A1c No results for input(s): HGBA1C in the last 72 hours. Lipid Profile No results for input(s): CHOL, HDL, LDLCALC, TRIG, CHOLHDL, LDLDIRECT in the last 72 hours. Thyroid function studies No results for input(s): TSH, T4TOTAL, T3FREE, THYROIDAB in the last 72 hours.  Invalid input(s): FREET3 Anemia work up No results for input(s): VITAMINB12, FOLATE, FERRITIN, TIBC, IRON, RETICCTPCT in the last 72 hours. Urinalysis    Component Value Date/Time   COLORURINE YELLOW 10/23/2016 0626   APPEARANCEUR CLEAR 10/23/2016 0626   LABSPEC 1.040 (H)  10/23/2016 0626   PHURINE 5.0 10/23/2016 0626   GLUCOSEU NEGATIVE 10/23/2016 0626   GLUCOSEU NEGATIVE 07/12/2014 1310   HGBUR NEGATIVE 10/23/2016 0626   HGBUR negative 12/11/2007 1029   BILIRUBINUR NEGATIVE  10/23/2016 0626   BILIRUBINUR Negative 07/05/2014 1542   KETONESUR NEGATIVE 10/23/2016 0626   PROTEINUR NEGATIVE 10/23/2016 0626   UROBILINOGEN >=8.0 (A) 07/12/2014 1310   NITRITE NEGATIVE 10/23/2016 0626   LEUKOCYTESUR NEGATIVE 10/23/2016 0626   Sepsis Labs Invalid input(s): PROCALCITONIN,  WBC,  LACTICIDVEN Microbiology Recent Results (from the past 240 hour(s))  Respiratory Panel by RT PCR (Flu A&B, Covid) - Nasopharyngeal Swab     Status: None   Collection Time: 12/07/19  7:14 PM   Specimen: Nasopharyngeal Swab  Result Value Ref Range Status   SARS Coronavirus 2 by RT PCR NEGATIVE NEGATIVE Final    Comment: (NOTE) SARS-CoV-2 target nucleic acids are NOT DETECTED.  The SARS-CoV-2 RNA is generally detectable in upper respiratoy specimens during the acute phase of infection. The lowest concentration of SARS-CoV-2 viral copies this assay can detect is 131 copies/mL. A negative result does not preclude SARS-Cov-2 infection and should not be used as the sole basis for treatment or other patient management decisions. A negative result may occur with  improper specimen collection/handling, submission of specimen other than nasopharyngeal swab, presence of viral mutation(s) within the areas targeted by this assay, and inadequate number of viral copies (<131 copies/mL). A negative result must be combined with clinical observations, patient history, and epidemiological information. The expected result is Negative.  Fact Sheet for Patients:  PinkCheek.be  Fact Sheet for Healthcare Providers:  GravelBags.it  This test is no t yet approved or cleared by the [place] FDA and  has been authorized for detection and/or  diagnosis of SARS-CoV-2 by FDA under an Emergency Use Authorization (EUA). This EUA will remain  in effect (meaning this test can be used) for the duration of the COVID-19 declaration under Section 564(b)(1) of the Act, 21 U.S.C. section 360bbb-3(b)(1), unless the authorization is terminated or revoked sooner.     Influenza A by PCR NEGATIVE NEGATIVE Final   Influenza B by PCR NEGATIVE NEGATIVE Final    Comment: (NOTE) The Xpert Xpress SARS-CoV-2/FLU/RSV assay is intended as an aid in  the diagnosis of influenza from Nasopharyngeal swab specimens and  should not be used as a sole basis for treatment. Nasal washings and  aspirates are unacceptable for Xpert Xpress SARS-CoV-2/FLU/RSV  testing.  Fact Sheet for Patients: PinkCheek.be  Fact Sheet for Healthcare Providers: GravelBags.it  This test is not yet approved or cleared by the [place] FDA and  has been authorized for detection and/or diagnosis of SARS-CoV-2 by  FDA under an Emergency Use Authorization (EUA). This EUA will remain  in effect (meaning this test can be used) for the duration of the  Covid-19 declaration under Section 564(b)(1) of the Act, 21  U.S.C. section 360bbb-3(b)(1), unless the authorization is  terminated or revoked. Performed at [facility], [place] [address], [place], [place] [address]     Procedures/Studies: DG Chest 2 View  Result Date: 12/07/2019 CLINICAL DATA:  Chest pain and shortness of breath. EXAM: CHEST - 2 VIEW COMPARISON:  12/05/2019 FINDINGS: The cardiac silhouette, mediastinal and hilar contours are within normal limits and stable. Patchy peripheral nodularity is noted as demonstrated on the CT scan. No focal airspace consolidation or pleural effusion. The bony thorax is intact. IMPRESSION: Patchy peripheral nodularity as demonstrated on the CT scan. No focal airspace consolidation or pleural effusion.  Electronically Signed   By: Marijo Sanes M.D.   On: 12/07/2019 16:11   DG Chest 2 View  Result Date: 12/05/2019 CLINICAL DATA:  Positive COVID-19.  Shortness of breath. EXAM: CHEST - 2 VIEW COMPARISON:  May 04, 2008. FINDINGS: The heart size and mediastinal contours are within normal limits. No pneumothorax or pleural effusion is noted. Right lung is clear. Multiple ill-defined opacities are noted peripherally in the left lung concerning for possible multifocal pneumonia. The visualized skeletal structures are unremarkable. IMPRESSION: Multiple ill-defined opacities are noted peripherally in the left lung concerning for possible multifocal pneumonia. Electronically Signed   By: Marijo Conception M.D.   On: 12/05/2019 14:52   CT Angio Chest W/Cm &/Or Wo Cm  Result Date: 12/06/2019 CLINICAL DATA:  Dyspnea on exertion, positive D-dimer. COVID-19 positive. EXAM: CT ANGIOGRAPHY CHEST WITH CONTRAST TECHNIQUE: Multidetector CT imaging of the chest was performed using the standard protocol during bolus administration of intravenous contrast. Multiplanar CT image reconstructions and MIPs were obtained to evaluate the vascular anatomy. CONTRAST:  122m OMNIPAQUE IOHEXOL 350 MG/ML SOLN COMPARISON:  November 05, 2016. FINDINGS: Cardiovascular: Atherosclerosis of thoracic aorta is noted without aneurysm or dissection. Filling defect is seen in lower lobe branch of right pulmonary artery consistent with acute pulmonary embolus. Normal cardiac size. No pericardial effusion. Mediastinum/Nodes: No enlarged mediastinal, hilar, or axillary lymph nodes. Thyroid gland, trachea, and esophagus demonstrate no significant findings. Lungs/Pleura: No pneumothorax or pleural effusion is noted. Multiple ill-defined airspace opacities are noted in both lungs, but most prominently seen in the left upper and lower lobes, most consistent with multifocal pneumonia due to COVID-19. 8 mm subpleural nodule is noted in the right upper lobe  laterally. Upper Abdomen: No acute abnormality. Musculoskeletal: No chest wall abnormality. No acute or significant osseous findings. Review of the MIP images confirms the above findings. IMPRESSION: 1. Filling defect is seen in lower lobe branch of right pulmonary artery consistent with acute pulmonary embolus. Critical Value/emergent results were called by telephone at the time of interpretation on 12/06/2019 at 12:58 pm to provider JOSE PAZ , who verbally acknowledged these results. 2. Multiple ill-defined airspace opacities are noted in both lungs, but most prominently seen in the left upper and lower lobes, most consistent with multifocal pneumonia due to COVID-19. 3. 8 mm subpleural nodule is noted in the right upper lobe laterally. Non-contrast chest CT at 6-12 months is recommended. If the nodule is stable at time of repeat CT, then future CT at 18-24 months (from today's scan) is considered optional for low-risk patients, but is recommended for high-risk patients. This recommendation follows the consensus statement: Guidelines for Management of Incidental Pulmonary Nodules Detected on CT Images: From the Fleischner Society 2017; Radiology 2017; 284:228-243. Aortic Atherosclerosis (ICD10-I70.0). Electronically Signed   By: JMarijo ConceptionM.D.   On: 12/06/2019 12:56   ECHOCARDIOGRAM COMPLETE  Result Date: 12/08/2019    ECHOCARDIOGRAM REPORT   Patient Name:   MDEYANA WNUKDate of Exam: 12/08/2019 Medical Rec #:  [id number]    Height:       66.0 in Accession #:    [id number]   Weight:       235.2 lb Date of Birth:  8March 13, 1947    BSA:          2.143 m Patient Age:    89years      BP:           152/68 mmHg Patient Gender: F             HR:           72 bpm. Exam Location:  Inpatient Procedure: 2D  Echo and Intracardiac Opacification Agent Indications:    Pulmonary Embolus I26.99  History:        Patient has prior history of Echocardiogram examinations, most                 recent 04/02/2019. Non-ischemic  cardiomyopathy, CAD; Risk                 Factors:Hypertension and Dyslipidemia.  Sonographer:    Mikki Santee RDCS (AE) Referring Phys: [place]  1. Left ventricular ejection fraction, by estimation, is 60 to 65%. The left ventricle has normal function. The left ventricle has no regional wall motion abnormalities. There is mild concentric left ventricular hypertrophy. Left ventricular diastolic parameters are consistent with Grade I diastolic dysfunction (impaired relaxation).  2. Right ventricular systolic function is mildly reduced. The right ventricular size is normal. There is normal pulmonary artery systolic pressure. The estimated right ventricular systolic pressure is 41.0 mmHg.  3. The mitral valve is normal in structure. No evidence of mitral valve regurgitation. No evidence of mitral stenosis.  4. The aortic valve is tricuspid. Aortic valve regurgitation is trivial. Mild aortic valve sclerosis is present, with no evidence of aortic valve stenosis.  5. The inferior vena cava is dilated in size with >50% respiratory variability, suggesting right atrial pressure of 8 mmHg. FINDINGS  Left Ventricle: Left ventricular ejection fraction, by estimation, is 60 to 65%. The left ventricle has normal function. The left ventricle has no regional wall motion abnormalities. The left ventricular internal cavity size was normal in size. There is  mild concentric left ventricular hypertrophy. Left ventricular diastolic parameters are consistent with Grade I diastolic dysfunction (impaired relaxation). Normal left ventricular filling pressure. Right Ventricle: The right ventricular size is normal. No increase in right ventricular wall thickness. Right ventricular systolic function is mildly reduced. There is normal pulmonary artery systolic pressure. The tricuspid regurgitant velocity is 1.83 m/s, and with an assumed right atrial pressure of 8 mmHg, the estimated right ventricular systolic  pressure is 30.1 mmHg. Left Atrium: Left atrial size was normal in size. Right Atrium: Right atrial size was normal in size. Pericardium: There is no evidence of pericardial effusion. Mitral Valve: The mitral valve is normal in structure. No evidence of mitral valve regurgitation. No evidence of mitral valve stenosis. Tricuspid Valve: The tricuspid valve is normal in structure. Tricuspid valve regurgitation is trivial. No evidence of tricuspid stenosis. Aortic Valve: The aortic valve is tricuspid. Aortic valve regurgitation is trivial. Aortic regurgitation PHT measures 506 msec. Mild aortic valve sclerosis is present, with no evidence of aortic valve stenosis. Pulmonic Valve: The pulmonic valve was normal in structure. Pulmonic valve regurgitation is not visualized. No evidence of pulmonic stenosis. Aorta: The aortic root is normal in size and structure. Venous: The inferior vena cava is dilated in size with greater than 50% respiratory variability, suggesting right atrial pressure of 8 mmHg. IAS/Shunts: No atrial level shunt detected by color flow Doppler.  LEFT VENTRICLE PLAX 2D LVIDd:         4.50 cm  Diastology LVIDs:         3.00 cm  LV e' medial:    5.44 cm/s LV PW:         1.10 cm  LV E/e' medial:  8.2 LV IVS:        1.10 cm  LV e' lateral:   6.42 cm/s LVOT diam:     2.20 cm  LV E/e' lateral: 6.9 LV SV:  78 LV SV Index:   36 LVOT Area:     3.80 cm  RIGHT VENTRICLE RV S prime:     12.20 cm/s TAPSE (M-mode): 1.9 cm LEFT ATRIUM             Index       RIGHT ATRIUM           Index LA diam:        3.90 cm 1.82 cm/m  RA Area:     21.00 cm LA Vol (A2C):   38.9 ml 18.16 ml/m RA Volume:   62.40 ml  29.12 ml/m LA Vol (A4C):   51.0 ml 23.80 ml/m LA Biplane Vol: 46.4 ml 21.66 ml/m  AORTIC VALVE LVOT Vmax:   92.00 cm/s LVOT Vmean:  58.200 cm/s LVOT VTI:    0.204 m AI PHT:      506 msec  AORTA Ao Root diam: 2.90 cm MITRAL VALVE               TRICUSPID VALVE MV Area (PHT): 2.20 cm    TR Peak grad:   13.4 mmHg  MV Decel Time: 345 msec    TR Vmax:        183.00 cm/s MV E velocity: 44.60 cm/s MV A velocity: 74.60 cm/s  SHUNTS MV E/A ratio:  0.60        Systemic VTI:  0.20 m                            Systemic Diam: 2.20 cm Fransico Him MD Electronically signed by Fransico Him MD Signature Date/Time: 12/08/2019/12:14:41 PM    Final      Time coordinating discharge: Over 30 minutes    Dwyane Dee, MD  [facility] 12/08/2019, 2:36 PM

## 2019-12-10 ENCOUNTER — Telehealth: Payer: Self-pay

## 2019-12-10 NOTE — Telephone Encounter (Signed)
Transition Care Management Follow-up Telephone Call  Date of discharge and from where:12/08/19- [facility]  How have you been since you were released from the hospital? Ok-coughing a lot  Any questions or concerns? No  Items Reviewed:  Did the pt receive and understand the discharge instructions provided? Yes   Medications obtained and verified? Yes   Other? N/A  Any new allergies since your discharge? No   Dietary orders reviewed? Yes  Do you have support at home? Yes   Home Care and Equipment/Supplies: Were home health services ordered? no If so, what is the name of the agency? n/a  Has the agency set up a time to come to the patient's home? not applicable Were any new equipment or medical supplies ordered?  No What is the name of the medical supply agency? n/a Were you able to get the supplies/equipment? not applicable Do you have any questions related to the use of the equipment or supplies? No  Functional Questionnaire: (I = Independent and D = Dependent) ADLs: I  Bathing/Dressing- I  Meal Prep- I-with assistance with cooking  Eating- I  Maintaining continence- I  Transferring/Ambulation- I  Managing Meds- I  Follow up appointments reviewed:   PCP Hospital f/u appt confirmed? Yes  Scheduled to see Dr. Larose Kells on 12/11/2019 @ 1:20pm.  [facility] f/u appt confirmed? Yes Scheduled to see Dr. Vaughan Browner on 12/19/19 @ 10am  Are transportation arrangements needed? No   If their condition worsens, is the pt aware to call PCP or go to the Emergency Dept.? Yes  Was the patient provided with contact information for the PCP's office or ED? Yes  Was to pt encouraged to call back with questions or concerns? Yes

## 2019-12-11 ENCOUNTER — Encounter: Payer: Self-pay | Admitting: Internal Medicine

## 2019-12-11 ENCOUNTER — Ambulatory Visit (INDEPENDENT_AMBULATORY_CARE_PROVIDER_SITE_OTHER): Payer: Medicare Other | Admitting: Internal Medicine

## 2019-12-11 ENCOUNTER — Other Ambulatory Visit: Payer: Self-pay

## 2019-12-11 VITALS — BP 128/90 | HR 118 | Temp 97.8°F | Resp 20 | Ht 66.0 in | Wt 230.4 lb

## 2019-12-11 DIAGNOSIS — I2699 Other pulmonary embolism without acute cor pulmonale: Secondary | ICD-10-CM

## 2019-12-11 DIAGNOSIS — U099 Post covid-19 condition, unspecified: Secondary | ICD-10-CM

## 2019-12-11 NOTE — Patient Instructions (Signed)
Please take Eliquis as prescribed.  If you can get Eliquis please let me know  Reschedule the visit you have with me next week, I need to see you in about 5 weeks from today.

## 2019-12-11 NOTE — Assessment & Plan Note (Signed)
COVID-19, pulmonary emboli: See last visit, since then was admitted to the hospital, treated conservatively and observe for 24 hours. Medications are the same, currently on Eliquis. Labs from the recent admission reviewed, potassium and hemoglobin well slightly decreased, the patient desires no further venipunctures.  We will recheck on RTC. Recommend to continue present care, it is extremely important that she does not run out of Eliquis, if she cannot get it I have asked her to call me. She is quite concerned about how she is feeling, I anticipate gradual (and possibly slow) recuperation. She has an appointment to see pulmonary next week. Next appointment with me should be in 5 weeks.  See AVS.

## 2019-12-11 NOTE — Progress Notes (Signed)
Pre visit review using our clinic review tool, if applicable. No additional management support is needed unless otherwise documented below in the visit note. 

## 2019-12-11 NOTE — Progress Notes (Signed)
Subjective:    Patient ID: [name], female    DOB: 11-Nov-1945, 74 y.o.   MRN: [id number]  DOS:  12/11/2019 Type of visit - description: Hospital follow-up  Since the last visit 12/05/2019 she was admitted to the hospital as she was not feeling better.  She was under observation for 24 hours, labs essentially unremarkable, D-dimer was a still increase.  Troponin negative. Echo: EF 60 to 76%, grade 1 diastolic dysfunction.  Normal right ventricle size.  Case management involved to be sure she can afford anticoagulation.  Review of Systems Since he left the hospital is feeling about the same, "perhaps a little better". No fever chills Continue with tiredness and some dyspnea. Occasional nausea and cough. Denies palpitations or lower extremity edema.  Past Medical History:  Diagnosis Date  . Allergy   . Anemia   . Anxiety   . Barrett's esophagus   . Bilateral carpal tunnel syndrome    Dr. Eddie Dibbles, having injection therapy  . C. difficile diarrhea 08/01/2012   severe 2014  . Carotid arterial disease ([place])   . Cataract    removed both eyes   . Chronic combined systolic and diastolic CHF (congestive heart failure) ([place])   . Chronic cystitis    Dr. Matilde Sprang  . Chronic lower back pain   . Chronic pain syndrome   . Depression   . Dizziness   . DJD (degenerative joint disease)    bilateral hands; knees  . Family history of anesthesia complication    Mother had severe N/V  . Fibromyalgia   . GERD (gastroesophageal reflux disease)   . H/O cardiac catheterization    (-) cath 12-2002  , cath again 2011 (-)  . History of colon polyps   . HTN (hypertension)   . Hyperlipidemia    past hx   . IBS (irritable bowel syndrome)   . Insomnia   . Migraine   . Morbid obesity ([place])   . Non-ischemic cardiomyopathy ([place])   . Osteoporosis    pt unsure of this  . RLS (restless legs syndrome)   . Vertigo   . Vitamin B 12 deficiency 04/09/2013    Past Surgical History:  Procedure  Laterality Date  . Arm surgery Left    "don't remember what they did; arm wasn't broken"  . BILATERAL KNEE ARTHROSCOPY Bilateral   . BIOPSY  08/07/2018   Procedure: BIOPSY;  Surgeon: Ladene Artist, MD;  Location: [facility];  Service: Endoscopy;;  . CARDIAC CATHETERIZATION  01/22/10   clean cath  . CATARACT EXTRACTION W/ INTRAOCULAR LENS  IMPLANT, BILATERAL Bilateral   . CHOLECYSTECTOMY N/A 10/25/2016   Procedure: LAPAROSCOPIC CHOLECYSTECTOMY;  Surgeon: Georganna Skeans, MD;  Location: [facility];  Service: General;  Laterality: N/A;  . COLONOSCOPY    . COLONOSCOPY W/ POLYPECTOMY    . ESOPHAGOGASTRODUODENOSCOPY (EGD) WITH PROPOFOL N/A 08/07/2018   Procedure: ESOPHAGOGASTRODUODENOSCOPY (EGD) WITH PROPOFOL;  Surgeon: Ladene Artist, MD;  [facility];  Service: Endoscopy;  Laterality: N/A;  . FOOT SURGERY Bilateral    toenails removed; callus removed on right; hammertoes right"  . Knot     "removed from right neck; not a goiter"  . LUMBAR DISC SURGERY     L5 S1 anterior fusion  . OOPHORECTOMY    . RIGHT/LEFT HEART CATH AND CORONARY ANGIOGRAPHY N/A 05/06/2017   Procedure: RIGHT/LEFT HEART CATH AND CORONARY ANGIOGRAPHY;  Surgeon: Nelva Bush, MD;  Location: [facility];  Service: Cardiovascular;  Laterality: N/A;  .  SHOULDER ARTHROSCOPY Right   . TOTAL KNEE ARTHROPLASTY  10/05/2011   Procedure: TOTAL KNEE ARTHROPLASTY;  Surgeon: Hessie Dibble, MD;  Location: [facility];  Service: Orthopedics;  Laterality: Right;  . UPPER GASTROINTESTINAL ENDOSCOPY    . VAGINAL HYSTERECTOMY     for endometriosis    Allergies as of 12/11/2019      Reactions   Dextromethorphan-guaifenesin Nausea And Vomiting   Morphine Nausea And Vomiting   Gabapentin Other (See Comments)   Dizziness, made her feel loopy   Penicillins Rash   "> 30 years ago; best I can remember it was just a light rash on my arm" Did it involve swelling of the face/tongue/throat, SOB, or low BP? No Did it involve  sudden or severe rash/hives, skin peeling, or any reaction on the inside of your mouth or nose? Unknown Did you need to seek medical attention at a hospital or doctor's office? No When did it last happen?more than 30 years  If all above answers are "NO", may proceed with cephalosporin use.      Medication List       Accurate as of December 11, 2019 10:12 PM. If you have any questions, ask your nurse or doctor.        STOP taking these medications   Rivaroxaban Stater Pack (15 mg and 20 mg) Commonly known as: XARELTO STARTER PACK Stopped by: Kathlene November, MD     TAKE these medications   albuterol 108 (90 Base) MCG/ACT inhaler Commonly known as: VENTOLIN HFA Inhale 2 puffs into the lungs every 6 (six) hours as needed for wheezing (Chest congestion).   aspirin EC 81 MG tablet Take 81 mg by mouth daily.   cholecalciferol 1000 units tablet Commonly known as: VITAMIN D Take 1,000 Units by mouth daily.   dicyclomine 20 MG tablet Commonly known as: BENTYL Take 1 tablet (20 mg total) by mouth 4 (four) times daily -  before meals and at bedtime.   Eliquis DVT/PE Starter Pack Generic drug: Apixaban Starter Pack (40m and 563m Take 1 tablet by mouth See admin instructions. Take as directed on package: start with two-64m80mablets twice daily for 7 days. On day 8, switch to one-64mg63mblet twice daily.   Entresto 97-103 MG Generic drug: sacubitril-valsartan Take 1 tablet by mouth 2 (two) times daily.   metoprolol succinate 50 MG 24 hr tablet Commonly known as: TOPROL-XL Take 0.5 tablets (25 mg total) by mouth daily.   Narcan 4 MG/0.1ML Liqd nasal spray kit Generic drug: naloxone Place 1 spray into the nose once as needed (opioid od).   nitroGLYCERIN 0.4 MG SL tablet Commonly known as: NITROSTAT Place 1 tablet (0.4 mg total) under the tongue every 5 (five) minutes as needed for chest pain.   omeprazole 40 MG capsule Commonly known as: PRILOSEC Take 1 capsule (40 mg total) by  mouth 2 (two) times daily.   rOPINIRole 1 MG tablet Commonly known as: REQUIP TAKE 1 TABLET BY MOUTH TWICE A DAY What changed: when to take this   sertraline 50 MG tablet Commonly known as: ZOLOFT TAKE 1 AND 1/2 TABLETS DAILY BY MOUTH What changed: See the new instructions.   traZODone 50 MG tablet Commonly known as: DESYREL TAKE 1-2 TABLETS (50-100 MG TOTAL) BY MOUTH AT BEDTIME. What changed: additional instructions          Objective:   Physical Exam BP 128/90 (BP Location: Left Arm, Patient Position: Sitting, Cuff Size: Normal)   Pulse (!) 118  Temp 97.8 F (36.6 C) (Oral)   Resp 20   Ht _0  (1.676 m)   Wt 230 lb 6 oz (104.5 kg)   SpO2 97%   BMI 37.18 kg/m     General:   Well developed, NAD, BMI noted. HEENT:  Normocephalic . Face symmetric, atraumatic Lungs:  CTA B Normal respiratory effort, no intercostal retractions, no accessory muscle use. Heart: RRR,  no murmur.  Lower extremities: no pretibial edema bilaterally  Skin: Not pale. Not jaundice Neurologic:  alert & oriented X3.  Speech normal, gait appropriate for age and unassisted Psych--  Cognition and judgment appear intact.  Cooperative with normal attention span and concentration.  Behavior appropriate. No anxious or depressed appearing.   Assessment     Assessment HTN Hyperlipidemia-  Pravachol, Crestor: Myalgias. Intolerant to Zetia (joint aches), see OV  06/2016  Depression, Anxiety (on clonazepam), insomnia (on trazodone):  depression x many  years, remotely on zoloft and other meds per psych, I rx lexapro 2015, then was rx effexor x a while Morbid obesity CV: CHF/ non ischemic cardiomyopathy dx 04-2017; cath normal coronaries, EF ~ 30% Carotid artery disease: 1 to 39% bilateral carotids 09-2016, start aspirin per neurology GI:  --GERD, Barrett's esophagus, had a EGD 07/2018  --h/o persistent C. difficile diarrhea 2014 B12 deficiency Osteopenia: Dexa 2015 showed osteopenia, T score  -1.9 (10/2016): Rx cs, vit D PULM: --NPSG 2008:  No osa, PLMS 4/hr with arousal and awakening --RLS - Requip  Migraines, f/u [facility] -- off topamax as off 06-2016 MSK: --Back pain, chronic: pain meds rx by ortho, Workers comp MD @ [employer] pain mngmt [place] --DJD,CTS B ([facility]) --Fibromyalgia.  See note from 02/14/2018 OAB, LUTS-- on myrbetriq Dr [place]oth  Raynaud phenomena---- DX 07/2017  PLAN COVID-19, pulmonary emboli: See last visit, since then was admitted to the hospital, treated conservatively and observe for 24 hours. Medications are the same, currently on Eliquis. Labs from the recent admission reviewed, potassium and hemoglobin well slightly decreased, the patient desires no further venipunctures.  We will recheck on RTC. Recommend to continue present care, it is extremely important that she does not run out of Eliquis, if she cannot get it I have asked her to call me. She is quite concerned about how she is feeling, I anticipate gradual (and possibly slow) recuperation. She has an appointment to see pulmonary next week. Next appointment with me should be in 5 weeks.  See AVS.     This visit occurred during the SARS-CoV-2 public health emergency.  Safety protocols were in place, including screening questions prior to the visit, additional usage of staff PPE, and extensive cleaning of exam room while observing appropriate contact time as indicated for disinfecting solutions.

## 2019-12-19 ENCOUNTER — Other Ambulatory Visit: Payer: Self-pay

## 2019-12-19 ENCOUNTER — Ambulatory Visit (INDEPENDENT_AMBULATORY_CARE_PROVIDER_SITE_OTHER): Payer: Medicare Other | Admitting: Pulmonary Disease

## 2019-12-19 ENCOUNTER — Encounter: Payer: Self-pay | Admitting: Pulmonary Disease

## 2019-12-19 VITALS — BP 126/78 | HR 108 | Temp 97.2°F | Ht 66.0 in | Wt 230.8 lb

## 2019-12-19 DIAGNOSIS — I2699 Other pulmonary embolism without acute cor pulmonale: Secondary | ICD-10-CM

## 2019-12-19 NOTE — Progress Notes (Signed)
[name]    [id number]    [birth year]  Primary Care Physician:Paz, Alda Berthold, MD  Referring Physician: Colon Branch, MD [address] [place],  [place] [address]  Chief complaint:  Consult for post COVID-19, PE  HPI: Ms. [name] is a 74 yo CF with PMH HTN, chronic s/dCHF, nonischemic CM on cath 04/2017 Barrett's esophagus, IBS-D, osteopenia, OAB, DJD and whom also had COVID pna diagnosed on 11/10/19. She did not require hospitalization.  She was treated with a monoclonal antibody infusion on 11/13/2019, a Z-Pak, and supportive measures and recovered at home but was largely immobile  She was diagnosed on 10/28 with right lower lobe PE and CT changes consistent with ongoing COVID-19 pneumonia started on Eliquis.  Briefly hospitalized from 10/29-10/30 for dyspnea.  Post discharge he is making slow improvements.  Has mild dyspnea on exertion.  Denies any cough, fevers.  Pets: 2 dogs, cat Occupation: Used to work in the post office Exposures: No known exposures.  No mold, hot tub, Jacuzzi Smoking history: Smoked in the early 20s Travel history: No significant travel history Relevant family history: No significant family issue of lung disease   Outpatient Encounter Medications as of 12/19/2019  Medication Sig  . albuterol (VENTOLIN HFA) 108 (90 Base) MCG/ACT inhaler Inhale 2 puffs into the lungs every 6 (six) hours as needed for wheezing (Chest congestion).  . Apixaban Starter Pack, 69m and 526m (ELIQUIS DVT/PE STARTER PACK) Take 1 tablet by mouth See admin instructions. Take as directed on package: start with two-73m62mablets twice daily for 7 days. On day 8, switch to one-73mg53mblet twice daily.  . asMarland Kitchenirin EC 81 MG tablet Take 81 mg by mouth daily.  . cholecalciferol (VITAMIN D) 1000 units tablet Take 1,000 Units by mouth daily.  . diMarland Kitchenyclomine (BENTYL) 20 MG tablet Take 1 tablet (20 mg total) by mouth 4 (four) times daily -  before meals and at bedtime.  .  metoprolol succinate (TOPROL-XL) 50 MG 24 hr tablet Take 0.5 tablets (25 mg total) by mouth daily.  . NAMarland KitchenCAN 4 MG/0.1ML LIQD nasal spray kit Place 1 spray into the nose once as needed (opioid od).   . nitroGLYCERIN (NITROSTAT) 0.4 MG SL tablet Place 1 tablet (0.4 mg total) under the tongue every 5 (five) minutes as needed for chest pain.  . omMarland Kitchenprazole (PRILOSEC) 40 MG capsule Take 1 capsule (40 mg total) by mouth 2 (two) times daily.  . rOMarland KitchenINIRole (REQUIP) 1 MG tablet TAKE 1 TABLET BY MOUTH TWICE A DAY (Patient taking differently: Take 1 mg by mouth in the morning and at bedtime. )  . sacubitril-valsartan (ENTRESTO) 97-103 MG Take 1 tablet by mouth 2 (two) times daily.  . sertraline (ZOLOFT) 50 MG tablet TAKE 1 AND 1/2 TABLETS DAILY BY MOUTH (Patient taking differently: Take 75 mg by mouth daily. )  . traZODone (DESYREL) 50 MG tablet TAKE 1-2 TABLETS (50-100 MG TOTAL) BY MOUTH AT BEDTIME. (Patient taking differently: Take 50-100 mg by mouth at bedtime. Take one tablet by mouth to help sleep, if still can not go to sleep, will take another tablet by mouth.)   No facility-administered encounter medications on file as of 12/19/2019.    Allergies as of 12/19/2019 - Review Complete 12/19/2019  Allergen Reaction Noted  . Dextromethorphan-guaifenesin Nausea And Vomiting 12/11/2007  . Morphine Nausea And Vomiting 07/15/2008  . Gabapentin Other (See Comments) 09/18/2019  . Penicillins Rash 12/13/2006  Past Medical History:  Diagnosis Date  . Allergy   . Anemia   . Anxiety   . Barrett's esophagus   . Bilateral carpal tunnel syndrome    Dr. Eddie Dibbles, having injection therapy  . C. difficile diarrhea 08/01/2012   severe 2014  . Carotid arterial disease ([place])   . Cataract    removed both eyes   . Chronic combined systolic and diastolic CHF (congestive heart failure) ([place])   . Chronic cystitis    Dr. Matilde Sprang  . Chronic lower back pain   . Chronic pain syndrome   . Depression   . Dizziness     . DJD (degenerative joint disease)    bilateral hands; knees  . Family history of anesthesia complication    Mother had severe N/V  . Fibromyalgia   . GERD (gastroesophageal reflux disease)   . H/O cardiac catheterization    (-) cath 12-2002  , cath again 2011 (-)  . History of colon polyps   . HTN (hypertension)   . Hyperlipidemia    past hx   . IBS (irritable bowel syndrome)   . Insomnia   . Migraine   . Morbid obesity ([place])   . Non-ischemic cardiomyopathy ([place])   . Osteoporosis    pt unsure of this  . RLS (restless legs syndrome)   . Vertigo   . Vitamin B 12 deficiency 04/09/2013    Past Surgical History:  Procedure Laterality Date  . Arm surgery Left    "don't remember what they did; arm wasn't broken"  . BILATERAL KNEE ARTHROSCOPY Bilateral   . BIOPSY  08/07/2018   Procedure: BIOPSY;  Surgeon: Ladene Artist, MD;  [facility];  Service: Endoscopy;;  . CARDIAC CATHETERIZATION  01/22/10   clean cath  . CATARACT EXTRACTION W/ INTRAOCULAR LENS  IMPLANT, BILATERAL Bilateral   . CHOLECYSTECTOMY N/A 10/25/2016   Procedure: LAPAROSCOPIC CHOLECYSTECTOMY;  Surgeon: Georganna Skeans, MD;  Location: [facility];  Service: General;  Laterality: N/A;  . COLONOSCOPY    . COLONOSCOPY W/ POLYPECTOMY    . ESOPHAGOGASTRODUODENOSCOPY (EGD) WITH PROPOFOL N/A 08/07/2018   Procedure: ESOPHAGOGASTRODUODENOSCOPY (EGD) WITH PROPOFOL;  Surgeon: Ladene Artist, MD;  [facility];  Service: Endoscopy;  Laterality: N/A;  . FOOT SURGERY Bilateral    toenails removed; callus removed on right; hammertoes right"  . Knot     "removed from right neck; not a goiter"  . LUMBAR DISC SURGERY     L5 S1 anterior fusion  . OOPHORECTOMY    . RIGHT/LEFT HEART CATH AND CORONARY ANGIOGRAPHY N/A 05/06/2017   Procedure: RIGHT/LEFT HEART CATH AND CORONARY ANGIOGRAPHY;  Surgeon: Nelva Bush, MD;  Location: [facility];  Service: Cardiovascular;  Laterality: N/A;  . SHOULDER ARTHROSCOPY  Right   . TOTAL KNEE ARTHROPLASTY  10/05/2011   Procedure: TOTAL KNEE ARTHROPLASTY;  Surgeon: Hessie Dibble, MD;  Location: [facility];  Service: Orthopedics;  Laterality: Right;  . UPPER GASTROINTESTINAL ENDOSCOPY    . VAGINAL HYSTERECTOMY     for endometriosis    Family History  Problem Relation Age of Onset  . Lung cancer Mother   . Dementia Father   . CAD Father        dx in his 29s  . Stroke Father   . Congestive Heart Failure Father   . Colon cancer Maternal Grandfather   . Esophageal cancer Brother   . Breast cancer Sister   . Diabetes Other  Aunt  . Colon cancer Maternal Uncle        2  . Hepatitis C Brother   . Rectal cancer Neg Hx   . Stomach cancer Neg Hx   . Colon polyps Neg Hx     Social History   Socioeconomic History  . Marital status: Divorced    Spouse name: Not on file  . Number of children: 2  . Years of education: 5  . Highest education level: High school graduate  Occupational History  . Occupation: Retired, post Ecologist: RETIRED  Tobacco Use  . Smoking status: Former Smoker    Packs/day: 0.50    Years: 10.00    Pack years: 5.00    Quit date: 02/09/1975    Years since quitting: 44.8  . Smokeless tobacco: Never Used  . Tobacco comment: started at age 64.   quit in the 78s.  Vaping Use  . Vaping Use: Never used  Substance and Sexual Activity  . Alcohol use: Not Currently    Alcohol/week: 0.0 standard drinks    Comment: 2 glasses of wine per year  . Drug use: No    Comment: CBD and hemp OIL   . Sexual activity: Not Currently    Partners: Male  Other Topics Concern  . Not on file  Social History Narrative   Son lives with her.   Right-handed.   2 soft drinks per day.   Social Determinants of Health   Financial Resource Strain: Low Risk   . Difficulty of Paying Living Expenses: Not hard at all  Food Insecurity: No Food Insecurity  . Worried About Charity fundraiser in the Last Year: Never true  . Ran Out of Food  in the Last Year: Never true  Transportation Needs: No Transportation Needs  . Lack of Transportation (Medical): No  . Lack of Transportation (Non-Medical): No  Physical Activity:   . Days of Exercise per Week: Not on file  . Minutes of Exercise per Session: Not on file  Stress:   . Feeling of Stress : Not on file  Social Connections:   . Frequency of Communication with Friends and Family: Not on file  . Frequency of Social Gatherings with Friends and Family: Not on file  . Attends Religious Services: Not on file  . Active Member of Clubs or Organizations: Not on file  . Attends Archivist Meetings: Not on file  . Marital Status: Not on file  Intimate Partner Violence:   . Fear of Current or Ex-Partner: Not on file  . Emotionally Abused: Not on file  . Physically Abused: Not on file  . Sexually Abused: Not on file    Review of systems: Review of Systems  Constitutional: Negative for fever and chills.  HENT: Negative.   Eyes: Negative for blurred vision.  Respiratory: as per HPI  Cardiovascular: Negative for chest pain and palpitations.  Gastrointestinal: Negative for vomiting, diarrhea, blood per rectum. Genitourinary: Negative for dysuria, urgency, frequency and hematuria.  Musculoskeletal: Negative for myalgias, back pain and joint pain.  Skin: Negative for itching and rash.  Neurological: Negative for dizziness, tremors, focal weakness, seizures and loss of consciousness.  Endo/Heme/Allergies: Negative for environmental allergies.  Psychiatric/Behavioral: Negative for depression, suicidal ideas and hallucinations.  All other systems reviewed and are negative.  Physical Exam: Blood pressure 126/78, pulse (!) 108, temperature (!) 97.2 F (36.2 C), temperature source Skin, height '5\' 6"'  (1.676 m), weight 230 lb 12.8 oz (  104.7 kg), SpO2 96 %. Gen:      No acute distress HEENT:  EOMI, sclera anicteric Neck:     No masses; no thyromegaly Lungs:    Clear to  auscultation bilaterally; normal respiratory effort CV:         Regular rate and rhythm; no murmurs Abd:      + bowel sounds; soft, non-tender; no palpable masses, no distension Ext:    No edema; adequate peripheral perfusion Skin:      Warm and dry; no rash Neuro: alert and oriented x 3 Psych: normal mood and affect  Data Reviewed: Imaging: CTA 12/06/2019-filling defect in the right lower lobe, bilateral airspace opacities, 8 mm right upper lobe nodule.  I have reviewed the images personally.  PFTs:  Labs:  Assessment:  Acute PE, post COVID-19 Making slow recovery Continue Eliquis anticoagulation Advised to start slow exercise regimen at home Will need PFTs in 3 months  Subcentimeter pulmonary nodule Follow-up CT in 6 months.  Plan/Recommendations: Continue Eliquis anticoagulation PFTs and follow-up in 3 months  Marshell Garfinkel MD [facility] 12/19/2019, 10:18 AM  CC: Colon Branch, MD

## 2019-12-19 NOTE — Patient Instructions (Addendum)
I am glad you are doing well and slowly improving after recent hospitalization Continue the anticoagulation pills I will follow back with you in 3 months time.

## 2019-12-20 ENCOUNTER — Ambulatory Visit: Payer: Medicare Other | Admitting: Internal Medicine

## 2020-01-01 ENCOUNTER — Other Ambulatory Visit: Payer: Self-pay | Admitting: Family Medicine

## 2020-01-15 ENCOUNTER — Ambulatory Visit (INDEPENDENT_AMBULATORY_CARE_PROVIDER_SITE_OTHER): Payer: Medicare Other | Admitting: Internal Medicine

## 2020-01-15 ENCOUNTER — Encounter: Payer: Self-pay | Admitting: Internal Medicine

## 2020-01-15 ENCOUNTER — Other Ambulatory Visit: Payer: Self-pay

## 2020-01-15 VITALS — BP 146/82 | HR 73 | Temp 97.8°F | Resp 18 | Ht 66.0 in | Wt 233.2 lb

## 2020-01-15 DIAGNOSIS — I2699 Other pulmonary embolism without acute cor pulmonale: Secondary | ICD-10-CM | POA: Diagnosis not present

## 2020-01-15 DIAGNOSIS — I5042 Chronic combined systolic (congestive) and diastolic (congestive) heart failure: Secondary | ICD-10-CM

## 2020-01-15 NOTE — Progress Notes (Signed)
Pre visit review using our clinic review tool, if applicable. No additional management support is needed unless otherwise documented below in the visit note. 

## 2020-01-15 NOTE — Progress Notes (Signed)
 Subjective:    Patient ID: [name], female    DOB: 05/25/1945, 74 y.o.   MRN: [id number]  DOS:  01/15/2020 Type of visit - description: Follow-up from previous visit  She was last seen after having COVID-19 and a pulmonary emboli. She was feeling quite fatigued. Overall she feels a slightly better.  Denies fever chills No chest pain or lower extremity edema Shortness of breath?  "I am always short of breath", not a new issue. Denies any blood in the urine or in the stools No cough, sputum production.   Review of Systems See above   Past Medical History:  Diagnosis Date  . Allergy   . Anemia   . Anxiety   . Barrett's esophagus   . Bilateral carpal tunnel syndrome    Dr. Paul, having injection therapy  . C. difficile diarrhea 08/01/2012   severe 2014  . Carotid arterial disease (HCC)   . Cataract    removed both eyes   . Chronic combined systolic and diastolic CHF (congestive heart failure) (HCC)   . Chronic cystitis    Dr. MacDiarmid  . Chronic lower back pain   . Chronic pain syndrome   . Depression   . Dizziness   . DJD (degenerative joint disease)    bilateral hands; knees  . Family history of anesthesia complication    Mother had severe N/V  . Fibromyalgia   . GERD (gastroesophageal reflux disease)   . H/O cardiac catheterization    (-) cath 12-2002  , cath again 2011 (-)  . History of colon polyps   . HTN (hypertension)   . Hyperlipidemia    past hx   . IBS (irritable bowel syndrome)   . Insomnia   . Migraine   . Morbid obesity (HCC)   . Non-ischemic cardiomyopathy (HCC)   . Osteoporosis    pt unsure of this  . RLS (restless legs syndrome)   . Vertigo   . Vitamin B 12 deficiency 04/09/2013    Past Surgical History:  Procedure Laterality Date  . Arm surgery Left    "don't remember what they did; arm wasn't broken"  . BILATERAL KNEE ARTHROSCOPY Bilateral   . BIOPSY  08/07/2018   Procedure: BIOPSY;  Surgeon: Stark, Malcolm T, MD;  Location:  [facility];  Service: Endoscopy;;  . CARDIAC CATHETERIZATION  01/22/10   clean cath  . CATARACT EXTRACTION W/ INTRAOCULAR LENS  IMPLANT, BILATERAL Bilateral   . CHOLECYSTECTOMY N/A 10/25/2016   Procedure: LAPAROSCOPIC CHOLECYSTECTOMY;  Surgeon: Thompson, Burke, MD;  [facility]: [facility];  Service: General;  Laterality: N/A;  . COLONOSCOPY    . COLONOSCOPY W/ POLYPECTOMY    . ESOPHAGOGASTRODUODENOSCOPY (EGD) WITH PROPOFOL N/A 08/07/2018   Procedure: ESOPHAGOGASTRODUODENOSCOPY (EGD) WITH PROPOFOL;  Surgeon: Stark, Malcolm T, MD;  [facility];  Service: Endoscopy;  Laterality: N/A;  . FOOT SURGERY Bilateral    toenails removed; callus removed on right; hammertoes right"  . Knot     "removed from right neck; not a goiter"  . LUMBAR DISC SURGERY     L5 S1 anterior fusion  . OOPHORECTOMY    . RIGHT/LEFT HEART CATH AND CORONARY ANGIOGRAPHY N/A 05/06/2017   Procedure: RIGHT/LEFT HEART CATH AND CORONARY ANGIOGRAPHY;  Surgeon: End, Christopher, MD;  [facility];  Service: Cardiovascular;  Laterality: N/A;  . SHOULDER ARTHROSCOPY Right   . TOTAL KNEE ARTHROPLASTY  10/05/2011   Procedure: TOTAL KNEE ARTHROPLASTY;  Surgeon: Peter G Dalldorf, MD;  [facility]:   [facility];  Service: Orthopedics;  Laterality: Right;  . UPPER GASTROINTESTINAL ENDOSCOPY    . VAGINAL HYSTERECTOMY     for endometriosis    Allergies as of 01/15/2020      Reactions   Dextromethorphan-guaifenesin Nausea And Vomiting   Morphine Nausea And Vomiting   Gabapentin Other (See Comments)   Dizziness, made her feel loopy   Penicillins Rash   "> 30 years ago; best I can remember it was just a light rash on my arm" Did it involve swelling of the face/tongue/throat, SOB, or low BP? No Did it involve sudden or severe rash/hives, skin peeling, or any reaction on the inside of your mouth or nose? Unknown Did you need to seek medical attention at a hospital or doctor's office? No When did it last happen?more than 30  years  If all above answers are "NO", may proceed with cephalosporin use.      Medication List       Accurate as of January 15, 2020 11:59 PM. If you have any questions, ask your nurse or doctor.        STOP taking these medications   aspirin EC 81 MG tablet Stopped by:  , MD     TAKE these medications   albuterol 108 (90 Base) MCG/ACT inhaler Commonly known as: VENTOLIN HFA Inhale 2 puffs into the lungs every 6 (six) hours as needed for wheezing (Chest congestion).   apixaban 5 MG Tabs tablet Commonly known as: ELIQUIS Take 1 tablet (5 mg total) by mouth 2 (two) times daily. What changed: Another medication with the same name was removed. Continue taking this medication, and follow the directions you see here. Changed by:  , MD   cholecalciferol 1000 units tablet Commonly known as: VITAMIN D Take 1,000 Units by mouth daily.   dicyclomine 20 MG tablet Commonly known as: BENTYL Take 1 tablet (20 mg total) by mouth 4 (four) times daily -  before meals and at bedtime.   Entresto 97-103 MG Generic drug: sacubitril-valsartan Take 1 tablet by mouth 2 (two) times daily.   metoprolol succinate 50 MG 24 hr tablet Commonly known as: TOPROL-XL Take 0.5 tablets (25 mg total) by mouth daily.   Narcan 4 MG/0.1ML Liqd nasal spray kit Generic drug: naloxone Place 1 spray into the nose once as needed (opioid od).   nitroGLYCERIN 0.4 MG SL tablet Commonly known as: NITROSTAT Place 1 tablet (0.4 mg total) under the tongue every 5 (five) minutes as needed for chest pain.   omeprazole 40 MG capsule Commonly known as: PRILOSEC Take 1 capsule (40 mg total) by mouth 2 (two) times daily.   rOPINIRole 1 MG tablet Commonly known as: REQUIP TAKE 1 TABLET BY MOUTH TWICE A DAY What changed: when to take this   sertraline 50 MG tablet Commonly known as: ZOLOFT TAKE 1 AND 1/2 TABLETS DAILY BY MOUTH What changed: See the new instructions.   traZODone 50 MG  tablet Commonly known as: DESYREL Take 1-2 tablets (50-100 mg total) by mouth at bedtime.          Objective:   Physical Exam BP (!) 146/82 (BP Location: Right Arm, Patient Position: Sitting, Cuff Size: Normal)   Pulse 73   Temp 97.8 F (36.6 C) (Oral)   Resp 18   Ht 5' 6" (1.676 m)   Wt 233 lb 4 oz (105.8 kg)   SpO2 97%   BMI 37.65 kg/m  General:   Well developed, NAD, BMI noted. HEENT:    Normocephalic . Face symmetric, atraumatic Lungs:  CTA B Normal respiratory effort, no intercostal retractions, no accessory muscle use. Heart: RRR,  no murmur.  Lower extremities: no pretibial edema bilaterally  Skin: Not pale. Not jaundice Neurologic:  alert & oriented X3.  Speech normal, gait appropriate for age and unassisted Psych--  Cognition and judgment appear intact.  Cooperative with normal attention span and concentration.  Behavior appropriate. No anxious or depressed appearing.      Assessment     Assessment HTN Hyperlipidemia-  Pravachol, Crestor: Myalgias. Intolerant to Zetia (joint aches), see OV  06/2016  Depression, Anxiety (on clonazepam), insomnia (on trazodone):  depression x many  years, remotely on zoloft and other meds per psych, I rx lexapro 2015, then was rx effexor x a while Morbid obesity CV: CHF/ non ischemic cardiomyopathy dx 04-2017; cath normal coronaries, EF ~ 30% Carotid artery disease: 1 to 39% bilateral carotids 09-2016, start aspirin per neurology GI:  --GERD, Barrett's esophagus, had a EGD 07/2018  --h/o persistent C. difficile diarrhea 2014 B12 deficiency Osteopenia: Dexa 2015 showed osteopenia, T score -1.9 (10/2016): Rx cs, vit D PULM: --NPSG 2008:  No osa, PLMS 4/hr with arousal and awakening --RLS - Requip  Migraines, f/u [facility] -- off topamax as off 06-2016 MSK: --Back pain, chronic: pain meds rx by ortho, Workers comp MD @ [employer] pain mngmt [place] --DJD,CTS B ([facility]) --Fibromyalgia.  See note from 02/14/2018 [facility],  LUTS-- on myrbetriq Dr McDarmoth  Raynaud phenomena---- DX 07/2017  PLAN COVID-19: Post acute COVID-19 she was extremely tired, she seems to be slightly better today. Pulmonary emboli: Saw pulmonary 12/19/2019, they recommend to continue anticoagulation, next follow-up with pulmonary with PFTs by 03/2020.  Encouraged to proceed. Not on aspirin because she is anticoagulated.  Samples of Eliquis 5 mg twice daily provided.  Recommend not to run out. CHF: Check a BMP and CBC RTC 3 months    This visit occurred during the SARS-CoV-2 public health emergency.  Safety protocols were in place, including screening questions prior to the visit, additional usage of staff PPE, and extensive cleaning of exam room while observing appropriate contact time as indicated for disinfecting solutions.

## 2020-01-15 NOTE — Patient Instructions (Signed)
You need to see the pulmonary doctors by 03-2020, call them in few weeks and make an appointment.  GO TO THE LAB : Get the blood work     [place], [place] back for a checkup in 3 months

## 2020-01-16 ENCOUNTER — Telehealth: Payer: Self-pay | Admitting: Cardiology

## 2020-01-16 LAB — BASIC METABOLIC PANEL
BUN: 14 mg/dL (ref 6–23)
CO2: 30 mEq/L (ref 19–32)
Calcium: 8.9 mg/dL (ref 8.4–10.5)
Chloride: 101 mEq/L (ref 96–112)
Creatinine, Ser: 0.92 mg/dL (ref 0.40–1.20)
GFR: 61 mL/min (ref >60.00)
Glucose, Bld: 115 mg/dL — ABNORMAL HIGH (ref 70–99)
Potassium: 4.6 mEq/L (ref 3.5–5.1)
Sodium: 138 mEq/L (ref 135–145)

## 2020-01-16 LAB — CBC WITH DIFFERENTIAL/PLATELET
Basophils Absolute: 0.1 10*3/uL (ref 0.0–0.1)
Basophils Relative: 1.3 % (ref 0.0–3.0)
Eosinophils Absolute: 0.1 10*3/uL (ref 0.0–0.7)
Eosinophils Relative: 1.2 % (ref 0.0–5.0)
HCT: 39.5 % (ref 36.0–46.0)
Hemoglobin: 13 g/dL (ref 12.0–15.0)
Lymphocytes Relative: 37.5 % (ref 12.0–46.0)
Lymphs Abs: 2.5 10*3/uL (ref 0.7–4.0)
MCHC: 32.9 g/dL (ref 30.0–36.0)
MCV: 91.3 fl (ref 78.0–100.0)
Monocytes Absolute: 0.3 10*3/uL (ref 0.1–1.0)
Monocytes Relative: 5.1 % (ref 3.0–12.0)
Neutro Abs: 3.7 10*3/uL (ref 1.4–7.7)
Neutrophils Relative %: 54.9 % (ref 43.0–77.0)
Platelets: 222 10*3/uL (ref 150.0–400.0)
RBC: 4.33 Mil/uL (ref 3.87–5.11)
RDW: 14.3 % (ref 11.5–15.5)
WBC: 6.7 10*3/uL (ref 4.0–10.5)

## 2020-01-16 NOTE — Telephone Encounter (Signed)
Patient states she does not have transportation for her appointment tomorrow and would like to know if her appointment can be virtual.

## 2020-01-17 ENCOUNTER — Other Ambulatory Visit: Payer: Self-pay

## 2020-01-17 ENCOUNTER — Telehealth (INDEPENDENT_AMBULATORY_CARE_PROVIDER_SITE_OTHER): Payer: Medicare Other | Admitting: Cardiology

## 2020-01-17 ENCOUNTER — Encounter: Payer: Self-pay | Admitting: Cardiology

## 2020-01-17 VITALS — BP 146/82 | HR 73 | Ht 66.0 in | Wt 230.0 lb

## 2020-01-17 DIAGNOSIS — I428 Other cardiomyopathies: Secondary | ICD-10-CM | POA: Diagnosis not present

## 2020-01-17 DIAGNOSIS — I5042 Chronic combined systolic (congestive) and diastolic (congestive) heart failure: Secondary | ICD-10-CM

## 2020-01-17 NOTE — Assessment & Plan Note (Signed)
COVID-19: Post acute COVID-19 she was extremely tired, she seems to be slightly better today. Pulmonary emboli: Saw pulmonary 12/19/2019, they recommend to continue anticoagulation, next follow-up with pulmonary with PFTs by 03/2020.  Encouraged to proceed. Not on aspirin because she is anticoagulated.  Samples of Eliquis 5 mg twice daily provided.  Recommend not to run out. CHF: Check a BMP and CBC RTC 3 months

## 2020-01-17 NOTE — Progress Notes (Signed)
Cardiology Office Note:         Virtual Visit via Video Note   This visit type was conducted due to national recommendations for restrictions regarding the COVID-19 Pandemic (e.g. social distancing) in an effort to limit this patient's exposure and mitigate transmission in our community.  Due to her co-morbid illnesses, this patient is at least at moderate risk for complications without adequate follow up.  This format is felt to be most appropriate for this patient at this time.  All issues noted in this document were discussed and addressed.  A limited physical exam was performed with this format.  Please refer to the patient's chart for her consent to telehealth for [facility].       Date:  01/17/2020   ID:  [name], DOB September 08, 1945, MRN [id number] The patient was identified using 2 identifiers.  Patient Location: Home Provider Location: Office/Clinic  Evaluation Performed:  Follow-Up Visit  Chief Complaint:  Heart failure COVID-19 Education: The signs and symptoms of COVID-19 were discussed with the patient and how to seek care for testing (follow up with PCP or arrange E-visit).  The importance of social distancing was discussed today.  Time:   Today, I have spent 21 minutes with the patient with telehealth technology discussing the above problems.     ID:  [name], DOB Oct 03, 1945, MRN [id number]  PCP:  Colon Branch, MD  [facility] Cardiologist:  Candee Furbish, MD  [facility] Electrophysiologist:  None   Referring MD: Colon Branch, MD     History of Present Illness:    [name] is a 74 y.o. female here for follow-up systolic diastolic heart failure EF as low as 35% nonischemic with cardiac catheterization in 2019 showing no CAD.  Entresto was started Lasix was decreased.  Echocardiogram thankfully in February 2021 showed normal EF.  Taking the [facility].  Lasix only as needed.  In October had dyspnea, developed Covid, still has some fatigue  associated with it.  Was treated for PE associated with Covid.  CT scan reviewed.  Denies any fevers chills nausea vomiting syncope bleeding  Past Medical History:  Diagnosis Date  . Allergy   . Anemia   . Anxiety   . Barrett's esophagus   . Bilateral carpal tunnel syndrome    Dr. Eddie Dibbles, having injection therapy  . C. difficile diarrhea 08/01/2012   severe 2014  . Carotid arterial disease ([place])   . Cataract    removed both eyes   . Chronic combined systolic and diastolic CHF (congestive heart failure) ([place])   . Chronic cystitis    Dr. Matilde Sprang  . Chronic lower back pain   . Chronic pain syndrome   . Depression   . Dizziness   . DJD (degenerative joint disease)    bilateral hands; knees  . Family history of anesthesia complication    Mother had severe N/V  . Fibromyalgia   . GERD (gastroesophageal reflux disease)   . H/O cardiac catheterization    (-) cath 12-2002  , cath again 2011 (-)  . History of colon polyps   . HTN (hypertension)   . Hyperlipidemia    past hx   . IBS (irritable bowel syndrome)   . Insomnia   . Migraine   . Morbid obesity ([place])   . Non-ischemic cardiomyopathy ([place])   . Osteoporosis    pt unsure of this  . RLS (restless legs syndrome)   . Vertigo   . Vitamin  B 12 deficiency 04/09/2013    Past Surgical History:  Procedure Laterality Date  . Arm surgery Left    "don't remember what they did; arm wasn't broken"  . BILATERAL KNEE ARTHROSCOPY Bilateral   . BIOPSY  08/07/2018   Procedure: BIOPSY;  Surgeon: Ladene Artist, MD;  [facility];  Service: Endoscopy;;  . CARDIAC CATHETERIZATION  01/22/10   clean cath  . CATARACT EXTRACTION W/ INTRAOCULAR LENS  IMPLANT, BILATERAL Bilateral   . CHOLECYSTECTOMY N/A 10/25/2016   Procedure: LAPAROSCOPIC CHOLECYSTECTOMY;  Surgeon: Georganna Skeans, MD;  Location: [place];  Service: General;  Laterality: N/A;  . COLONOSCOPY    . COLONOSCOPY W/ POLYPECTOMY    . ESOPHAGOGASTRODUODENOSCOPY (EGD) WITH  PROPOFOL N/A 08/07/2018   Procedure: ESOPHAGOGASTRODUODENOSCOPY (EGD) WITH PROPOFOL;  Surgeon: Ladene Artist, MD;  [facility];  Service: Endoscopy;  Laterality: N/A;  . FOOT SURGERY Bilateral    toenails removed; callus removed on right; hammertoes right"  . Knot     "removed from right neck; not a goiter"  . LUMBAR DISC SURGERY     L5 S1 anterior fusion  . OOPHORECTOMY    . RIGHT/LEFT HEART CATH AND CORONARY ANGIOGRAPHY N/A 05/06/2017   Procedure: RIGHT/LEFT HEART CATH AND CORONARY ANGIOGRAPHY;  Surgeon: Nelva Bush, MD;  Location: [place] CV LAB;  Service: Cardiovascular;  Laterality: N/A;  . SHOULDER ARTHROSCOPY Right   . TOTAL KNEE ARTHROPLASTY  10/05/2011   Procedure: TOTAL KNEE ARTHROPLASTY;  Surgeon: Hessie Dibble, MD;  Location: [place];  Service: Orthopedics;  Laterality: Right;  . UPPER GASTROINTESTINAL ENDOSCOPY    . VAGINAL HYSTERECTOMY     for endometriosis    Current Medications: Current Meds  Medication Sig  . albuterol (VENTOLIN HFA) 108 (90 Base) MCG/ACT inhaler Inhale 2 puffs into the lungs every 6 (six) hours as needed for wheezing (Chest congestion).  [name] apixaban (ELIQUIS) 5 MG TABS tablet Take 1 tablet (5 mg total) by mouth 2 (two) times daily.  . cholecalciferol (VITAMIN D) 1000 units tablet Take 1,000 Units by mouth daily.  [name] dicyclomine (BENTYL) 20 MG tablet Take 1 tablet (20 mg total) by mouth 4 (four) times daily -  before meals and at bedtime.  . metoprolol succinate (TOPROL-XL) 50 MG 24 hr tablet Take 0.5 tablets (25 mg total) by mouth daily.  [name] NARCAN 4 MG/0.1ML LIQD nasal spray kit Place 1 spray into the nose once as needed (opioid od).  . nitroGLYCERIN (NITROSTAT) 0.4 MG SL tablet Place 1 tablet (0.4 mg total) under the tongue every 5 (five) minutes as needed for chest pain.  [name] omeprazole (PRILOSEC) 40 MG capsule Take 1 capsule (40 mg total) by mouth 2 (two) times daily.  . pregabalin (LYRICA) 50 MG capsule Take 50 mg by mouth 3  (three) times daily.  [name] rOPINIRole (REQUIP) 1 MG tablet TAKE 1 TABLET BY MOUTH TWICE A DAY  . sacubitril-valsartan (ENTRESTO) 97-103 MG Take 1 tablet by mouth 2 (two) times daily.  . sertraline (ZOLOFT) 50 MG tablet TAKE 1 AND 1/2 TABLETS DAILY BY MOUTH  . traZODone (DESYREL) 50 MG tablet Take 1-2 tablets (50-100 mg total) by mouth at bedtime.     Allergies:   Dextromethorphan-guaifenesin, Morphine, Gabapentin, and Penicillins   Social History   Socioeconomic History  . Marital status: Divorced    Spouse name: Not on file  . Number of children: 2  . Years of education: 49  . Highest education level: High school graduate  Occupational  History  . Occupation: Retired, post Ecologist: RETIRED  Tobacco Use  . Smoking status: Former Smoker    Packs/day: 0.50    Years: 10.00    Pack years: 5.00    Quit date: 02/09/1975    Years since quitting: 44.9  . Smokeless tobacco: Never Used  . Tobacco comment: started at age 24.   quit in the 46s.  Vaping Use  . Vaping Use: Never used  Substance and Sexual Activity  . Alcohol use: Not Currently    Alcohol/week: 0.0 standard drinks    Comment: 2 glasses of wine per year  . Drug use: No    Comment: CBD and hemp OIL   . Sexual activity: Not Currently    Partners: Male  Other Topics Concern  . Not on file  Social History Narrative   Son lives with her.   Right-handed.   2 soft drinks per day.   Social Determinants of Health   Financial Resource Strain: Low Risk   . Difficulty of Paying Living Expenses: Not hard at all  Food Insecurity: No Food Insecurity  . Worried About Charity fundraiser in the Last Year: Never true  . Ran Out of Food in the Last Year: Never true  Transportation Needs: No Transportation Needs  . Lack of Transportation (Medical): No  . Lack of Transportation (Non-Medical): No  Physical Activity: Not on file  Stress: Not on file  Social Connections: Not on file     Family History: The patient's  family history includes Breast cancer in her sister; CAD in her father; Colon cancer in her maternal grandfather and maternal uncle; Congestive Heart Failure in her father; Dementia in her father; Diabetes in an other family member; Esophageal cancer in her brother; Hepatitis C in her brother; Lung cancer in her mother; Stroke in her father. There is no history of Rectal cancer, Stomach cancer, or Colon polyps.  ROS:   Please see the history of present illness.     All other systems reviewed and are negative.  EKGs/Labs/Other Studies Reviewed:    The following studies were reviewed today:   ECHO 05/06/17: Compared to a prior echo in 2011, LVEF is lower at 40-45% with predominant inferior and inferoseptal hypokinesis.  Cardiac cath 05/06/2017 Conclusions: 1. No angiographically significant coronary artery disease. 2. Moderately to severely reduced left ventricular contraction (LVEF 30-35%), consistent with non-ischemic cardiomyopathy. 3. Upper normal to mildly elevated left heart, right heart, and pulmonary artery pressures. 4. Low normal to mildly decreased cardiac output/index.  Recommendations: 1. Optimize evidence-based heart failure therapy. 2. Primary prevention of coronary artery disease.  Nelva Bush, MD  ECHO 04/02/19:  1. Compared with the echo 05/5807, systolic function has improved. . Left  ventricular ejection fraction, by estimation, is 55 to 60%. Left  ventricular ejection fraction by PLAX is 65 %. The left ventricle has  normal function. The left ventricle has no  regional wall motion abnormalities. Left ventricular diastolic parameters  are consistent with Grade I diastolic dysfunction (impaired relaxation).  2. Right ventricular systolic function is normal. The right ventricular  size is normal. There is normal pulmonary artery systolic pressure.  3. The mitral valve is normal in structure and function. Trivial mitral  valve regurgitation. No  evidence of mitral stenosis.  4. The aortic valve is tricuspid. Aortic valve regurgitation is mild. No  aortic stenosis is present.  5. The inferior vena cava is normal in size with greater than 50%  respiratory variability, suggesting right atrial pressure of 3 mmHg.   12/08/19: ECHO  1. Left ventricular ejection fraction, by estimation, is 60 to 65%. The  left ventricle has normal function. The left ventricle has no regional  wall motion abnormalities. There is mild concentric left ventricular  hypertrophy. Left ventricular diastolic  parameters are consistent with Grade I diastolic dysfunction (impaired  relaxation).  2. Right ventricular systolic function is mildly reduced. The right  ventricular size is normal. There is normal pulmonary artery systolic  pressure. The estimated right ventricular systolic pressure is 05.6 mmHg.  3. The mitral valve is normal in structure. No evidence of mitral valve  regurgitation. No evidence of mitral stenosis.  4. The aortic valve is tricuspid. Aortic valve regurgitation is trivial.  Mild aortic valve sclerosis is present, with no evidence of aortic valve  stenosis.  5. The inferior vena cava is dilated in size with >50% respiratory  variability, suggesting right atrial pressure of 8 mmHg.   EKG: EKG from 12/07/2019 shows sinus tachycardia, RBBB no other changes.  Recent Labs: 12/07/2019: ALT 14; B Natriuretic Peptide 63.1 01/15/2020: BUN 14; Creatinine, Ser 0.92; Hemoglobin 13.0; Platelets 222.0; Potassium 4.6; Sodium 138  Recent Lipid Panel    Component Value Date/Time   CHOL 148 10/30/2015 1102   TRIG 68.0 10/30/2015 1102   HDL 60.50 10/30/2015 1102   CHOLHDL 2 10/30/2015 1102   VLDL 13.6 10/30/2015 1102   LDLCALC 74 10/30/2015 1102   LDLDIRECT 166.0 04/14/2010 1539      Physical Exam:    VS:  BP (!) 146/82   Pulse 73   Ht '5\' 6"'  (1.676 m)   Wt 230 lb (104.3 kg)   BMI 37.12 kg/m     Wt Readings from Last 3  Encounters:  01/17/20 230 lb (104.3 kg)  01/15/20 233 lb 4 oz (105.8 kg)  12/19/19 230 lb 12.8 oz (104.7 kg)    General alert and oriented x3.  Normal respiratory effort.  Jovial.  ASSESSMENT:    1. Nonischemic cardiomyopathy ([place])   2. Chronic combined systolic and diastolic CHF (congestive heart failure) ([place])   3. Morbid obesity ([place])    PLAN:    In order of problems listed above:  Chronic systolic heart failure -EF previously 30% now normal 60%.  Excellent.  NYHA class II-like symptoms.  Mild shortness of breath with activity. -Has Lasix now as needed -Entresto continue with this.  Continue with Toprol.  Excellent.  Very reassuring  PE  - Eliquis 97-monthtreatment.  Surrounding Covid. Hemoglobin 11.1 creatinine 0.8 hemoglobin A1c 5.7 -Rare chest discomfort.  We will see her back in 6 months.  Shared Decision Making/Informed Consent        Medication Adjustments/Labs and Tests Ordered: Current medicines are reviewed at length with the patient today.  Concerns regarding medicines are outlined above.  No orders of the defined types were placed in this encounter.  No orders of the defined types were placed in this encounter.   Patient Instructions  Medication Instructions:  The current medical regimen is effective;  continue present plan and medications.  *If you need a refill on your cardiac medications before your next appointment, please call your pharmacy*  Follow-Up: [facility] you and your health needs are our priority.  As part of our continuing mission to provide you with exceptional heart care, we have created designated Provider Care Teams.  These Care Teams include your primary Cardiologist (physician) and Advanced Practice Providers (APPs -  Physician Assistants and Nurse Practitioners) who all work together to provide you with the care you need, when you need it.  We recommend signing up for the patient portal called "MyChart".  Sign up  information is provided on this After Visit Summary.  MyChart is used to connect with patients for Virtual Visits (Telemedicine).  Patients are able to view lab/test results, encounter notes, upcoming appointments, etc.  Non-urgent messages can be sent to your provider as well.   To learn more about what you can do with MyChart, go to [email or web address].    Your next appointment:   6 month(s)  The format for your next appointment:   In Person  Provider:   Candee Furbish, MD   Thank you for choosing [facility]!!         Signed, Candee Furbish, MD  01/17/2020 1:58 PM    [place]

## 2020-01-17 NOTE — Telephone Encounter (Signed)
Per Barbee Shropshire, CMA - pt is aware appt has been made virtual.

## 2020-01-17 NOTE — Patient Instructions (Signed)

## 2020-01-17 NOTE — Telephone Encounter (Signed)
Yes Thanks Candee Furbish, MD

## 2020-02-04 ENCOUNTER — Other Ambulatory Visit: Payer: Self-pay | Admitting: Gastroenterology

## 2020-02-10 ENCOUNTER — Other Ambulatory Visit: Payer: Self-pay | Admitting: Internal Medicine

## 2020-02-18 ENCOUNTER — Encounter: Payer: Self-pay | Admitting: Internal Medicine

## 2020-02-18 ENCOUNTER — Other Ambulatory Visit: Payer: Self-pay

## 2020-02-18 ENCOUNTER — Telehealth (INDEPENDENT_AMBULATORY_CARE_PROVIDER_SITE_OTHER): Payer: Medicare Other | Admitting: Internal Medicine

## 2020-02-18 VITALS — Ht 66.0 in | Wt 232.0 lb

## 2020-02-18 DIAGNOSIS — R32 Unspecified urinary incontinence: Secondary | ICD-10-CM

## 2020-02-18 DIAGNOSIS — G9331 Postviral fatigue syndrome: Secondary | ICD-10-CM

## 2020-02-18 DIAGNOSIS — G933 Postviral fatigue syndrome: Secondary | ICD-10-CM

## 2020-02-18 DIAGNOSIS — J302 Other seasonal allergic rhinitis: Secondary | ICD-10-CM | POA: Diagnosis not present

## 2020-02-18 NOTE — Progress Notes (Signed)
Subjective:    Patient ID: [name], female    DOB: Jul 05, 1945, 75 y.o.   MRN: [id number]  DOS:  02/18/2020 Type of visit - description: Virtual Visit via Video Note  I connected with the above patient  by a video enabled telemedicine application and verified that I am speaking with the correct person using two identifiers.   THIS ENCOUNTER IS A VIRTUAL VISIT DUE TO COVID-19 - PATIENT WAS NOT SEEN IN THE OFFICE. PATIENT HAS CONSENTED TO VIRTUAL VISIT / TELEMEDICINE VISIT   Location of patient: home  Location of provider: office  Persons participating in the virtual visit: patient, provider   I discussed the limitations of evaluation and management by telemedicine and the availability of in person appointments. The patient expressed understanding and agreed to proceed.   Acute Symptoms started shortly after Christmas: Some cough, hoarseness, feeling chilly. She denies chest congestion, watery eyes but for the last few days admits to itchy nose, watery nose.  No fever chills. Overall cough is getting better but other symptoms persist. Occasionally feels short of breath, albuterol definitely help.    Also, in the last few days had 2 episodes of urinary incontinence while sleeping. Denies any dysuria or gross hematuria. Denies any major issues with back pain but it does hurt from time to time. No bowel incontinence Has paresthesias at the right leg, this is not a new symptoms and is not getting worse.  Continued with severe fatigue since she had COVID.     Review of Systems See above   Past Medical History:  Diagnosis Date  . Allergy   . Anemia   . Anxiety   . Barrett's esophagus   . Bilateral carpal tunnel syndrome    Dr. Eddie Dibbles, having injection therapy  . C. difficile diarrhea 08/01/2012   severe 2014  . Carotid arterial disease ([place])   . Cataract    removed both eyes   . Chronic combined systolic and diastolic CHF (congestive heart failure) ([place])   . Chronic  cystitis    Dr. Matilde Sprang  . Chronic lower back pain   . Chronic pain syndrome   . Depression   . Dizziness   . DJD (degenerative joint disease)    bilateral hands; knees  . Family history of anesthesia complication    Mother had severe N/V  . Fibromyalgia   . GERD (gastroesophageal reflux disease)   . H/O cardiac catheterization    (-) cath 12-2002  , cath again 2011 (-)  . History of colon polyps   . HTN (hypertension)   . Hyperlipidemia    past hx   . IBS (irritable bowel syndrome)   . Insomnia   . Migraine   . Morbid obesity ([place])   . Non-ischemic cardiomyopathy ([place])   . Osteoporosis    pt unsure of this  . RLS (restless legs syndrome)   . Vertigo   . Vitamin B 12 deficiency 04/09/2013    Past Surgical History:  Procedure Laterality Date  . Arm surgery Left    "don't remember what they did; arm wasn't broken"  . BILATERAL KNEE ARTHROSCOPY Bilateral   . BIOPSY  08/07/2018   Procedure: BIOPSY;  Surgeon: Ladene Artist, MD;  Location: [facility];  Service: Endoscopy;;  . CARDIAC CATHETERIZATION  01/22/10   clean cath  . CATARACT EXTRACTION W/ INTRAOCULAR LENS  IMPLANT, BILATERAL Bilateral   . CHOLECYSTECTOMY N/A 10/25/2016   Procedure: LAPAROSCOPIC CHOLECYSTECTOMY;  Surgeon: Georganna Skeans, MD;  [facility];  Service: General;  Laterality: N/A;  . COLONOSCOPY    . COLONOSCOPY W/ POLYPECTOMY    . ESOPHAGOGASTRODUODENOSCOPY (EGD) WITH PROPOFOL N/A 08/07/2018   Procedure: ESOPHAGOGASTRODUODENOSCOPY (EGD) WITH PROPOFOL;  Surgeon: Ladene Artist, MD;  [facility]: [facility];  Service: Endoscopy;  Laterality: N/A;  . FOOT SURGERY Bilateral    toenails removed; callus removed on right; hammertoes right"  . Knot     "removed from right neck; not a goiter"  . LUMBAR DISC SURGERY     L5 S1 anterior fusion  . OOPHORECTOMY    . RIGHT/LEFT HEART CATH AND CORONARY ANGIOGRAPHY N/A 05/06/2017   Procedure: RIGHT/LEFT HEART CATH AND CORONARY ANGIOGRAPHY;  Surgeon: Nelva Bush, MD;  Location: [facility];  Service: Cardiovascular;  Laterality: N/A;  . SHOULDER ARTHROSCOPY Right   . TOTAL KNEE ARTHROPLASTY  10/05/2011   Procedure: TOTAL KNEE ARTHROPLASTY;  Surgeon: Hessie Dibble, MD;  Location: [facility];  Service: Orthopedics;  Laterality: Right;  . UPPER GASTROINTESTINAL ENDOSCOPY    . VAGINAL HYSTERECTOMY     for endometriosis    Allergies as of 02/18/2020      Reactions   Dextromethorphan-guaifenesin Nausea And Vomiting   Morphine Nausea And Vomiting   Gabapentin Other (See Comments)   Dizziness, made her feel loopy   Penicillins Rash   "> 30 years ago; best I can remember it was just a light rash on my arm" Did it involve swelling of the face/tongue/throat, SOB, or low BP? No Did it involve sudden or severe rash/hives, skin peeling, or any reaction on the inside of your mouth or nose? Unknown Did you need to seek medical attention at a hospital or doctor's office? No When did it last happen?more than 30 years  If all above answers are "NO", may proceed with cephalosporin use.      Medication List       Accurate as of February 18, 2020 11:59 PM. If you have any questions, ask your nurse or doctor.        albuterol 108 (90 Base) MCG/ACT inhaler Commonly known as: VENTOLIN HFA Inhale 2 puffs into the lungs every 6 (six) hours as needed for wheezing (Chest congestion).   apixaban 5 MG Tabs tablet Commonly known as: Eliquis Take 1 tablet (5 mg total) by mouth 2 (two) times daily.   cholecalciferol 1000 units tablet Commonly known as: VITAMIN D Take 1,000 Units by mouth daily.   dicyclomine 20 MG tablet Commonly known as: BENTYL TAKE 1 TABLET (20 MG TOTAL) BY MOUTH 4 (FOUR) TIMES DAILY - BEFORE MEALS AND AT BEDTIME.   Entresto 97-103 MG Generic drug: sacubitril-valsartan Take 1 tablet by mouth 2 (two) times daily.   metoprolol succinate 50 MG 24 hr tablet Commonly known as: TOPROL-XL Take 0.5 tablets (25 mg total) by  mouth daily.   Narcan 4 MG/0.1ML Liqd nasal spray kit Generic drug: naloxone Place 1 spray into the nose once as needed (opioid od).   nitroGLYCERIN 0.4 MG SL tablet Commonly known as: NITROSTAT Place 1 tablet (0.4 mg total) under the tongue every 5 (five) minutes as needed for chest pain.   omeprazole 40 MG capsule Commonly known as: PRILOSEC Take 1 capsule (40 mg total) by mouth 2 (two) times daily.   pregabalin 50 MG capsule Commonly known as: LYRICA Take 50 mg by mouth 3 (three) times daily.   rOPINIRole 1 MG tablet Commonly known as: REQUIP TAKE 1 TABLET BY MOUTH TWICE A DAY  sertraline 50 MG tablet Commonly known as: ZOLOFT TAKE 1 AND 1/2 TABLETS DAILY BY MOUTH   traZODone 50 MG tablet Commonly known as: DESYREL Take 1-2 tablets (50-100 mg total) by mouth at bedtime.          Objective:   Physical Exam Ht '5\' 6"'  (1.676 m)   Wt 232 lb (105.2 kg)   BMI 37.45 kg/m  This is a virtual video visit, alert oriented x3, in no distress, speaking in complete sentences.  No vital signs available.    Assessment      Assessment HTN Hyperlipidemia-  Pravachol, Crestor: Myalgias. Intolerant to Zetia (joint aches), see OV  06/2016  Depression, Anxiety (on clonazepam), insomnia (on trazodone):  depression x many  years, remotely on zoloft and other meds per psych, I rx lexapro 2015, then was rx effexor x a while Morbid obesity CV: CHF/ non ischemic cardiomyopathy dx 04-2017; cath normal coronaries, EF ~ 30% Carotid artery disease: 1 to 39% bilateral carotids 09-2016, start aspirin per neurology GI:  --GERD, Barrett's esophagus, had a EGD 07/2018  --h/o persistent C. 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  See note from 02/14/2018 OAB, LUTS-- on myrbetriq Dr McDarmoth  Raynaud phenomena---- DX 07/2017 COVID-19, pulmonary emboli, monoclonal infusion 11/13/2019  PLAN Seasonal allergies: Having symptoms since Christmas, no fever chills, most likely allergy related per patient's description. Plan: Start Flonase, Claritin, Robitussin, call if not better. Urinary incontinence: As described above, no severe back pain, no bowel incontinence.  This happened at night while sleeping.  Denies incontinence with cough or sneezing.  Plan: UA, urine culture, if symptoms persist consider further evaluation. Fatigue: Reports extreme fatigue since she had COVID and a pulmonary emboli.  That needs to be assessed in person, recommend to schedule a visit. Preventive care: Strongly encouraged to proceed with flu and a COVID vaccination   I discussed the assessment and treatment plan with the patient. The patient was provided an opportunity to ask questions and all were answered. The patient agreed with the plan and demonstrated an understanding of the instructions.   The patient was advised to call back or seek an in-person evaluation if the symptoms worsen or if the condition fails to improve as anticipated.

## 2020-02-18 NOTE — Progress Notes (Unsigned)
Pre visit review using our clinic review tool, if applicable. No additional management support is needed unless otherwise documented below in the visit note. 

## 2020-02-19 ENCOUNTER — Other Ambulatory Visit: Payer: Self-pay

## 2020-02-19 ENCOUNTER — Other Ambulatory Visit (INDEPENDENT_AMBULATORY_CARE_PROVIDER_SITE_OTHER): Payer: Medicare Other

## 2020-02-19 DIAGNOSIS — R32 Unspecified urinary incontinence: Secondary | ICD-10-CM

## 2020-02-19 NOTE — Assessment & Plan Note (Signed)
Seasonal allergies: Having symptoms since Christmas, no fever chills, most likely allergy related per patient's description. Plan: Start Flonase, Claritin, Robitussin, call if not better. Urinary incontinence: As described above, no severe back pain, no bowel incontinence.  This happened at night while sleeping.  Denies incontinence with cough or sneezing.  Plan: UA, urine culture, if symptoms persist consider further evaluation. Fatigue: Reports extreme fatigue since she had COVID and a pulmonary emboli.  That needs to be assessed in person, recommend to schedule a visit. Preventive care: Strongly encouraged to proceed with flu and a COVID vaccination

## 2020-02-20 ENCOUNTER — Other Ambulatory Visit: Payer: Medicare Other

## 2020-02-20 LAB — URINALYSIS, ROUTINE W REFLEX MICROSCOPIC
Bilirubin Urine: NEGATIVE
Hgb urine dipstick: NEGATIVE
Ketones, ur: NEGATIVE
Nitrite: NEGATIVE
RBC / HPF: NONE SEEN (ref 0–?)
Specific Gravity, Urine: 1.025 (ref 1.000–1.030)
Total Protein, Urine: NEGATIVE
Urine Glucose: NEGATIVE
Urobilinogen, UA: 0.2 (ref 0.0–1.0)
pH: 6 (ref 5.0–8.0)

## 2020-02-22 LAB — URINE CULTURE
MICRO NUMBER:: 11409592
SPECIMEN QUALITY:: ADEQUATE

## 2020-02-25 MED ORDER — SULFAMETHOXAZOLE-TRIMETHOPRIM 800-160 MG PO TABS: 1.0000 | ORAL_TABLET | Freq: Two times a day (BID) | ORAL | 0 refills | Status: DC

## 2020-02-25 NOTE — Addendum Note (Signed)
Addended byDamita Dunnings D on: 02/25/2020 10:38 AM   Modules accepted: Orders

## 2020-04-02 ENCOUNTER — Other Ambulatory Visit: Payer: Self-pay | Admitting: Gastroenterology

## 2020-04-15 ENCOUNTER — Ambulatory Visit (INDEPENDENT_AMBULATORY_CARE_PROVIDER_SITE_OTHER): Payer: Medicare Other | Admitting: Internal Medicine

## 2020-04-15 ENCOUNTER — Other Ambulatory Visit: Payer: Self-pay

## 2020-04-15 ENCOUNTER — Encounter: Payer: Self-pay | Admitting: Internal Medicine

## 2020-04-15 VITALS — BP 128/84 | HR 79 | Temp 98.5°F | Resp 18 | Ht 66.0 in | Wt 248.5 lb

## 2020-04-15 DIAGNOSIS — R32 Unspecified urinary incontinence: Secondary | ICD-10-CM

## 2020-04-15 DIAGNOSIS — R5383 Other fatigue: Secondary | ICD-10-CM | POA: Diagnosis not present

## 2020-04-15 DIAGNOSIS — F419 Anxiety disorder, unspecified: Secondary | ICD-10-CM

## 2020-04-15 DIAGNOSIS — F32A Depression, unspecified: Secondary | ICD-10-CM

## 2020-04-15 DIAGNOSIS — Z0189 Encounter for other specified special examinations: Secondary | ICD-10-CM

## 2020-04-15 DIAGNOSIS — R739 Hyperglycemia, unspecified: Secondary | ICD-10-CM | POA: Diagnosis not present

## 2020-04-15 DIAGNOSIS — E559 Vitamin D deficiency, unspecified: Secondary | ICD-10-CM | POA: Diagnosis not present

## 2020-04-15 DIAGNOSIS — I2699 Other pulmonary embolism without acute cor pulmonale: Secondary | ICD-10-CM

## 2020-04-15 DIAGNOSIS — I10 Essential (primary) hypertension: Secondary | ICD-10-CM | POA: Diagnosis not present

## 2020-04-15 MED ORDER — SERTRALINE HCL 50 MG PO TABS
75.0000 mg | ORAL_TABLET | Freq: Every day | ORAL | 1 refills | Status: DC
Start: 2020-04-15 — End: 2020-11-11

## 2020-04-15 NOTE — Progress Notes (Addendum)
Subjective:    Patient ID: [name], female    DOB: [birth year], 75 y.o.   MRN: [id number]  DOS:  04/15/2020 Type of visit - description: Follow-up  See  last visit, reported urinary incontinence, improved.  Continue reporting fatigue.  States that she feels exhausted most days, admits to snoring.  Good compliance with Eliquis.  Weight gain noted, reports that her diet has been very unhealthy, denies lower extremity edema.  Self stop sertraline, she felt that she could do without it but request to restart.  Wt Readings from Last 3 Encounters:  04/15/20 248 lb 8 oz (112.7 kg)  02/18/20 232 lb (105.2 kg)  01/17/20 230 lb (104.3 kg)     Review of Systems No chest pain, occasionally has episodes of shortness of breath that quickly respond to inhalers. + DOE, for the last 3 months. Denies any blood in the stools or in the urine No suicidal ideas   Past Medical History:  Diagnosis Date  . Allergy   . Anemia   . Anxiety   . Barrett's esophagus   . Bilateral carpal tunnel syndrome    Dr. Eddie Dibbles, having injection therapy  . C. difficile diarrhea 08/01/2012   severe 2014  . Carotid arterial disease ([place])   . Cataract    removed both eyes   . Chronic combined systolic and diastolic CHF (congestive heart failure) ([place])   . Chronic cystitis    Dr. Matilde Sprang  . Chronic lower back pain   . Chronic pain syndrome   . Depression   . Dizziness   . DJD (degenerative joint disease)    bilateral hands; knees  . Family history of anesthesia complication    Mother had severe N/V  . Fibromyalgia   . GERD (gastroesophageal reflux disease)   . H/O cardiac catheterization    (-) cath 12-2002  , cath again 2011 (-)  . History of colon polyps   . HTN (hypertension)   . Hyperlipidemia    past hx   . IBS (irritable bowel syndrome)   . Insomnia   . Migraine   . Morbid obesity ([place])   . Non-ischemic cardiomyopathy ([place])   . Osteoporosis    pt unsure of this  . RLS (restless legs  syndrome)   . Vertigo   . Vitamin B 12 deficiency 04/09/2013    Past Surgical History:  Procedure Laterality Date  . Arm surgery Left    "don't remember what they did; arm wasn't broken"  . BILATERAL KNEE ARTHROSCOPY Bilateral   . BIOPSY  08/07/2018   Procedure: BIOPSY;  Surgeon: Ladene Artist, MD;  [facility];  Service: Endoscopy;;  . CARDIAC CATHETERIZATION  01/22/10   clean cath  . CATARACT EXTRACTION W/ INTRAOCULAR LENS  IMPLANT, BILATERAL Bilateral   . CHOLECYSTECTOMY N/A 10/25/2016   Procedure: LAPAROSCOPIC CHOLECYSTECTOMY;  Surgeon: Georganna Skeans, MD;  Location: [facility];  Service: General;  Laterality: N/A;  . COLONOSCOPY    . COLONOSCOPY W/ POLYPECTOMY    . ESOPHAGOGASTRODUODENOSCOPY (EGD) WITH PROPOFOL N/A 08/07/2018   Procedure: ESOPHAGOGASTRODUODENOSCOPY (EGD) WITH PROPOFOL;  Surgeon: Ladene Artist, MD;  [facility];  Service: Endoscopy;  Laterality: N/A;  . FOOT SURGERY Bilateral    toenails removed; callus removed on right; hammertoes right"  . Knot     "removed from right neck; not a goiter"  . LUMBAR DISC SURGERY     L5 S1 anterior fusion  . OOPHORECTOMY    . RIGHT/LEFT HEART CATH  AND CORONARY ANGIOGRAPHY N/A 05/06/2017   Procedure: RIGHT/LEFT HEART CATH AND CORONARY ANGIOGRAPHY;  Surgeon: Nelva Bush, MD;  Location: [facility];  Service: Cardiovascular;  Laterality: N/A;  . SHOULDER ARTHROSCOPY Right   . TOTAL KNEE ARTHROPLASTY  10/05/2011   Procedure: TOTAL KNEE ARTHROPLASTY;  Surgeon: Hessie Dibble, MD;  Location: [facility];  Service: Orthopedics;  Laterality: Right;  . UPPER GASTROINTESTINAL ENDOSCOPY    . VAGINAL HYSTERECTOMY     for endometriosis    Allergies as of 04/15/2020      Reactions   Dextromethorphan-guaifenesin Nausea And Vomiting   Morphine Nausea And Vomiting   Gabapentin Other (See Comments)   Dizziness, made her feel loopy   Penicillins Rash   "> 30 years ago; best I can remember it was just a light rash  on my arm" Did it involve swelling of the face/tongue/throat, SOB, or low BP? No Did it involve sudden or severe rash/hives, skin peeling, or any reaction on the inside of your mouth or nose? Unknown Did you need to seek medical attention at a hospital or doctor's office? No When did it last happen?more than 30 years  If all above answers are "NO", may proceed with cephalosporin use.      Medication List       Accurate as of April 15, 2020 11:59 PM. If you have any questions, ask your nurse or doctor.        STOP taking these medications   sulfamethoxazole-trimethoprim 800-160 MG tablet Commonly known as: BACTRIM DS Stopped by: Kathlene November, MD     TAKE these medications   albuterol 108 (90 Base) MCG/ACT inhaler Commonly known as: VENTOLIN HFA Inhale 2 puffs into the lungs every 6 (six) hours as needed for wheezing (Chest congestion).   apixaban 5 MG Tabs tablet Commonly known as: Eliquis Take 1 tablet (5 mg total) by mouth 2 (two) times daily.   cholecalciferol 1000 units tablet Commonly known as: VITAMIN D Take 1,000 Units by mouth daily.   dicyclomine 20 MG tablet Commonly known as: BENTYL TAKE 1 TABLET (20 MG TOTAL) BY MOUTH 4 (FOUR) TIMES DAILY - BEFORE MEALS AND AT BEDTIME.   Entresto 97-103 MG Generic drug: sacubitril-valsartan Take 1 tablet by mouth 2 (two) times daily.   metoprolol succinate 50 MG 24 hr tablet Commonly known as: TOPROL-XL Take 0.5 tablets (25 mg total) by mouth daily.   Narcan 4 MG/0.1ML Liqd nasal spray kit Generic drug: naloxone Place 1 spray into the nose once as needed (opioid od).   nitroGLYCERIN 0.4 MG SL tablet Commonly known as: NITROSTAT Place 1 tablet (0.4 mg total) under the tongue every 5 (five) minutes as needed for chest pain.   omeprazole 40 MG capsule Commonly known as: PRILOSEC Take 1 capsule (40 mg total) by mouth 2 (two) times daily.   pregabalin 50 MG capsule Commonly known as: LYRICA Take 50 mg by mouth 3  (three) times daily.   rOPINIRole 1 MG tablet Commonly known as: REQUIP TAKE 1 TABLET BY MOUTH TWICE A DAY   sertraline 50 MG tablet Commonly known as: ZOLOFT Take 1.5 tablets (75 mg total) by mouth daily. What changed: See the new instructions. Changed by: Kathlene November, MD   traZODone 50 MG tablet Commonly known as: DESYREL Take 1-2 tablets (50-100 mg total) by mouth at bedtime.          Objective:   Physical Exam BP 128/84 (BP Location: Left Arm, Patient Position: Sitting, Cuff Size:  Normal)   Pulse 79   Temp 98.5 F (36.9 C) (Oral)   Resp 18   Ht '5\' 6"'  (1.676 m)   Wt 248 lb 8 oz (112.7 kg)   SpO2 96%   BMI 40.11 kg/m  General:   Well developed, NAD, BMI noted.  HEENT:  Normocephalic . Face symmetric, atraumatic Neck: No JVD at 45 degrees Lungs:  CTA B Normal respiratory effort, no intercostal retractions, no accessory muscle use. Heart: RRR,  no murmur.  Abdomen:  Not distended, soft, non-tender. No rebound or rigidity.   Skin: Not pale. Not jaundice Lower extremities: Trace pretibial edema bilaterally  Neurologic:  alert & oriented X3.  Speech normal, gait appropriate for age and unassisted Psych--  Cognition and judgment appear intact.  Cooperative with normal attention span and concentration.  Behavior appropriate. No anxious or depressed appearing.     Assessment      Assessment HTN Hyperlipidemia-  Pravachol, Crestor: Myalgias. Intolerant to Zetia (joint aches), see OV  06/2016  Depression, Anxiety (on clonazepam), insomnia (on trazodone):  depression x many  years, remotely on zoloft and other meds per psych, I rx lexapro 2015, then was rx effexor x a while Morbid obesity CV: CHF/ non ischemic cardiomyopathy dx 04-2017; cath normal coronaries, EF ~ 30% Carotid artery disease: 1 to 39% bilateral carotids 09-2016, start aspirin per neurology GI:  --GERD, Barrett's esophagus, had a EGD 07/2018  --h/o persistent C. 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  See note from 02/14/2018 OAB, LUTS-- on myrbetriq Dr McDarmoth  Raynaud phenomena---- DX 07/2017 COVID-19, pulmonary emboli, monoclonal infusion 11/13/2019  PLAN Urinary incontinence: See last visit, had a E. coli UTI, had antibiotics, now better.  Does have some nocturia. HTN: Very good BP today, on metoprolol, Pulmonary emboli: DX 11-2019, on Eliquis, good compliance, no apparent problems, likely will need to stay anticoagulant for 6 to 12 months. Morbid obesity: BMI 40, admits to poor diet, + weight gain noted, no evidence of fluid overload, strongly recommend to moderate her diet. Fatigue: Ongoing problem, although she said this started few months ago, on chart review this go back actually few years.  Has multiple RF for OSA, neg sleep study 2008, was referred to pulmonary 10-2018 for consideration of repeat sleep study but I do not think that ever happened. Epworth scale today: 9, mildly positive Is probably multifactorial, no recent anemia, no h/o thyroid dz-b 12 def; does have low vit D. Plan: Recheck a TSH, vitamin D.  Refer to  Dr. Halford Chessman, repeat a sleep study? Depression, anxiety: Self stopped sertraline, request to restart.  Recommend: Sertraline 50 mg: Half tablet daily for a week, then 1 tablet daily for a week, then 1.5 tablets a day. Mild hyperglycemia: Per chart review, now with weight gain, check a A1c RTC 4 months    Time spent 40 minutes, reviewing chart extensively due to ongoing fatigue. This visit occurred during the SARS-CoV-2 public health emergency.  Safety protocols were in place, including screening questions  prior to the visit, additional usage of staff PPE, and extensive cleaning of exam room while observing appropriate contact time as indicated for disinfecting  solutions.

## 2020-04-15 NOTE — Patient Instructions (Addendum)
Please bring [place] a copy of your Healthcare Power of Attorney and Living Will. This is for your chart.   Restart  Sertraline 50 mg:  Half tablet daily for a week, then 1 tablet daily for a week, then 1.5 tablets a day.    GO TO THE LAB : Get the blood work     [place], [place] back for a checkup in 4 months

## 2020-04-16 LAB — HEMOGLOBIN A1C: Hgb A1c MFr Bld: 5.6 % (ref 4.6–6.5)

## 2020-04-16 LAB — VITAMIN D 25 HYDROXY (VIT D DEFICIENCY, FRACTURES): VITD: 28.12 ng/mL — ABNORMAL LOW (ref 30.00–100.00)

## 2020-04-16 LAB — TSH: TSH: 1.89 u[IU]/mL (ref 0.35–4.50)

## 2020-04-16 NOTE — Addendum Note (Signed)
Addended byDamita Dunnings D on: 04/16/2020 09:50 AM   Modules accepted: Orders

## 2020-04-16 NOTE — Assessment & Plan Note (Addendum)
Urinary incontinence: See last visit, had a E. coli UTI, had antibiotics, now better.  Does have some nocturia. HTN: Very good BP today, on metoprolol, Pulmonary emboli: DX 11-2019, on Eliquis, good compliance, no apparent problems, likely will need to stay anticoagulant for 6 to 12 months. Morbid obesity: BMI 40, admits to poor diet, + weight gain noted, no evidence of fluid overload, strongly recommend to moderate her diet. Fatigue: Ongoing problem, although she said this started few months ago, on chart review this go back actually few years.  Has multiple RF for OSA, neg sleep study 2008, was referred to pulmonary 10-2018 for consideration of repeat sleep study but I do not think that ever happened. Epworth scale today: 9, mildly positive Is probably multifactorial, no recent anemia, no h/o thyroid dz-b 12 def; does have low vit D. Plan: Recheck a TSH, vitamin D.  Refer to  Dr. Halford Chessman, repeat a sleep study? Depression, anxiety: Self stopped sertraline, request to restart.  Recommend: Sertraline 50 mg: Half tablet daily for a week, then 1 tablet daily for a week, then 1.5 tablets a day. Mild hyperglycemia: Per chart review, now with weight gain, check a A1c RTC 4 months

## 2020-04-21 MED ORDER — VITAMIN D (ERGOCALCIFEROL) 1.25 MG (50000 UNIT) PO CAPS: 50000.0000 [IU] | ORAL_CAPSULE | ORAL | 0 refills | Status: DC

## 2020-04-21 NOTE — Addendum Note (Signed)
Addended byDamita Dunnings D on: 04/21/2020 08:29 AM   Modules accepted: Orders

## 2020-04-29 ENCOUNTER — Other Ambulatory Visit: Payer: Self-pay | Admitting: Family Medicine

## 2020-04-29 ENCOUNTER — Other Ambulatory Visit: Payer: Self-pay | Admitting: Cardiology

## 2020-05-05 ENCOUNTER — Other Ambulatory Visit: Payer: Self-pay | Admitting: Gastroenterology

## 2020-05-29 ENCOUNTER — Other Ambulatory Visit: Payer: Self-pay | Admitting: Gastroenterology

## 2020-05-30 ENCOUNTER — Telehealth: Payer: Self-pay | Admitting: Gastroenterology

## 2020-05-30 MED ORDER — OMEPRAZOLE 40 MG PO CPDR
40.0000 mg | DELAYED_RELEASE_CAPSULE | Freq: Two times a day (BID) | ORAL | 0 refills | Status: DC
Start: 2020-05-30 — End: 2020-06-23

## 2020-05-30 NOTE — Telephone Encounter (Signed)
Prescription sent to patient's pharmacy until scheduled appt. 

## 2020-05-30 NOTE — Telephone Encounter (Signed)
Patient call in need med refill for Omeprazole. Have appointment 06/24/20. [facility] in [place]

## 2020-06-21 ENCOUNTER — Other Ambulatory Visit: Payer: Self-pay | Admitting: Internal Medicine

## 2020-06-23 ENCOUNTER — Other Ambulatory Visit: Payer: Self-pay | Admitting: Gastroenterology

## 2020-06-24 ENCOUNTER — Ambulatory Visit (INDEPENDENT_AMBULATORY_CARE_PROVIDER_SITE_OTHER): Payer: Medicare Other | Admitting: Gastroenterology

## 2020-06-24 ENCOUNTER — Encounter: Payer: Self-pay | Admitting: Gastroenterology

## 2020-06-24 VITALS — BP 128/86 | HR 80 | Ht 66.0 in | Wt 253.8 lb

## 2020-06-24 DIAGNOSIS — K2270 Barrett's esophagus without dysplasia: Secondary | ICD-10-CM | POA: Diagnosis not present

## 2020-06-24 DIAGNOSIS — K580 Irritable bowel syndrome with diarrhea: Secondary | ICD-10-CM

## 2020-06-24 MED ORDER — OMEPRAZOLE 40 MG PO CPDR
1.0000 | DELAYED_RELEASE_CAPSULE | Freq: Two times a day (BID) | ORAL | 3 refills | Status: DC
Start: 2020-06-24 — End: 2021-06-15

## 2020-06-24 MED ORDER — DICYCLOMINE HCL 20 MG PO TABS
20.0000 mg | ORAL_TABLET | Freq: Three times a day (TID) | ORAL | 3 refills | Status: DC
Start: 2020-06-24 — End: 2022-01-13

## 2020-06-24 NOTE — Progress Notes (Signed)
    History of Present Illness: This is a 75 year old female with IBS, Barrett's without dysplasia, family history of colon cancer in 2 second degree relatives.  Her reflux is generally under good control on omeprazole twice daily.  Certain foods and beverages exacerbate symptoms however she tries to avoid these foods and beverages.  Frequent generalized abdominal pain and diarrhea.  Imodium has been very effective to control her diarrhea.  She uses dicyclomine infrequently and says she just forgets.  Current Medications, Allergies, Past Medical History, Past Surgical History, Family History and Social History were reviewed in [employer] record.   Physical Exam: General: Well developed, well nourished, no acute distress Head: Normocephalic and atraumatic Eyes: Sclerae anicteric, EOMI Ears: Normal auditory acuity Mouth: Not examined, mask on during Covid-19 pandemic Lungs: Clear throughout to auscultation Heart: Regular rate and rhythm; no murmurs, rubs or bruits Abdomen: Soft, non tender and non distended. No masses, hepatosplenomegaly or hernias noted. Normal Bowel sounds Rectal: Not done Musculoskeletal: Symmetrical with no gross deformities  Pulses:  Normal pulses noted Extremities: No clubbing, cyanosis, edema or deformities noted Neurological: Alert oriented x 4, grossly nonfocal Psychological:  Alert and cooperative. Normal mood and affect   Assessment and Recommendations:  1. Barrett's esophagus. GERD.  Closely follow antireflux measures.  Continue omeprazole 40 mg po bid. if her health status remains stable we will plan for surveillance EGD in June 2023.  2. IBS-D. Dicyclomine 20 mg po qid. Imodium tid prn. REV in 1 year.   3. History of PE on Eliquis.   4.  FHx colon cancer, 2 second-degree relatives.  Given her comorbidities and age we will not plan for future routine colonoscopies.

## 2020-06-24 NOTE — Patient Instructions (Signed)
We have sent the following medications to your pharmacy for you to pick up at your convenience: omeprazole and dicyclomine.   Thank you for choosing me and [facility].  Malcolm T. Stark, Jr., MD., FACG   

## 2020-07-01 ENCOUNTER — Other Ambulatory Visit: Payer: Self-pay | Admitting: Internal Medicine

## 2020-07-24 ENCOUNTER — Other Ambulatory Visit: Payer: Self-pay | Admitting: Internal Medicine

## 2020-08-15 ENCOUNTER — Ambulatory Visit: Payer: Medicare Other | Admitting: Internal Medicine

## 2020-08-21 ENCOUNTER — Encounter: Payer: Self-pay | Admitting: Internal Medicine

## 2020-08-21 ENCOUNTER — Other Ambulatory Visit: Payer: Self-pay

## 2020-08-21 ENCOUNTER — Ambulatory Visit (INDEPENDENT_AMBULATORY_CARE_PROVIDER_SITE_OTHER): Payer: Medicare Other | Admitting: Internal Medicine

## 2020-08-21 VITALS — BP 126/80 | HR 56 | Temp 98.4°F | Resp 18 | Ht 66.0 in | Wt 250.1 lb

## 2020-08-21 DIAGNOSIS — I10 Essential (primary) hypertension: Secondary | ICD-10-CM | POA: Diagnosis not present

## 2020-08-21 DIAGNOSIS — Z23 Encounter for immunization: Secondary | ICD-10-CM

## 2020-08-21 DIAGNOSIS — Z0189 Encounter for other specified special examinations: Secondary | ICD-10-CM

## 2020-08-21 DIAGNOSIS — F32A Depression, unspecified: Secondary | ICD-10-CM

## 2020-08-21 DIAGNOSIS — F419 Anxiety disorder, unspecified: Secondary | ICD-10-CM | POA: Diagnosis not present

## 2020-08-21 DIAGNOSIS — R5383 Other fatigue: Secondary | ICD-10-CM | POA: Diagnosis not present

## 2020-08-21 DIAGNOSIS — Z1231 Encounter for screening mammogram for malignant neoplasm of breast: Secondary | ICD-10-CM

## 2020-08-21 LAB — BASIC METABOLIC PANEL
BUN: 15 mg/dL (ref 6–23)
CO2: 30 mEq/L (ref 19–32)
Calcium: 8.5 mg/dL (ref 8.4–10.5)
Chloride: 104 mEq/L (ref 96–112)
Creatinine, Ser: 0.9 mg/dL (ref 0.40–1.20)
GFR: 62.37 mL/min (ref >60.00)
Glucose, Bld: 97 mg/dL (ref 70–99)
Potassium: 4.1 mEq/L (ref 3.5–5.1)
Sodium: 141 mEq/L (ref 135–145)

## 2020-08-21 LAB — CBC WITH DIFFERENTIAL/PLATELET
Basophils Absolute: 0 10*3/uL (ref 0.0–0.1)
Basophils Relative: 0.6 % (ref 0.0–3.0)
Eosinophils Absolute: 0.1 10*3/uL (ref 0.0–0.7)
Eosinophils Relative: 1.3 % (ref 0.0–5.0)
HCT: 38.4 % (ref 36.0–46.0)
Hemoglobin: 12.8 g/dL (ref 12.0–15.0)
Lymphocytes Relative: 30.5 % (ref 12.0–46.0)
Lymphs Abs: 2.1 10*3/uL (ref 0.7–4.0)
MCHC: 33.2 g/dL (ref 30.0–36.0)
MCV: 88.1 fl (ref 78.0–100.0)
Monocytes Absolute: 0.4 10*3/uL (ref 0.1–1.0)
Monocytes Relative: 6.5 % (ref 3.0–12.0)
Neutro Abs: 4.2 10*3/uL (ref 1.4–7.7)
Neutrophils Relative %: 61.1 % (ref 43.0–77.0)
Platelets: 211 10*3/uL (ref 150.0–400.0)
RBC: 4.36 Mil/uL (ref 3.87–5.11)
RDW: 14.3 % (ref 11.5–15.5)
WBC: 6.8 10*3/uL (ref 4.0–10.5)

## 2020-08-21 NOTE — Progress Notes (Signed)
Subjective:    Patient ID: [name], female    DOB: Jul 30, 1945, 75 y.o.   MRN: [id number]  DOS:  08/21/2020 Type of visit - description: Follow-up Follow-up from previous visit. Her main concern still is fatigue. Restart her sertraline, some help?  Patient is not sure.  No suicidal. Finished ergocalciferol Continue anticoagulated, denies blood in the stools.  Wt Readings from Last 3 Encounters:  08/21/20 250 lb 2 oz (113.5 kg)  06/24/20 253 lb 12.8 oz (115.1 kg)  04/15/20 248 lb 8 oz (112.7 kg)     Review of Systems See above   Past Medical History:  Diagnosis Date   Allergy    Anemia    Anxiety    Barrett's esophagus    Bilateral carpal tunnel syndrome    Dr. Eddie Dibbles, having injection therapy   C. difficile diarrhea 08/01/2012   severe 2014   Carotid arterial disease ([place])    Cataract    removed both eyes    Chronic combined systolic and diastolic CHF (congestive heart failure) ([place])    Chronic cystitis    Dr. Matilde Sprang   Chronic lower back pain    Chronic pain syndrome    Depression    Dizziness    DJD (degenerative joint disease)    bilateral hands; knees   Family history of anesthesia complication    Mother had severe N/V   Fibromyalgia    GERD (gastroesophageal reflux disease)    H/O cardiac catheterization    (-) cath 12-2002  , cath again 2011 (-)   History of colon polyps    HTN (hypertension)    Hyperlipidemia    past hx    IBS (irritable bowel syndrome)    Insomnia    Migraine    Morbid obesity ([place])    Non-ischemic cardiomyopathy ([place])    Osteoporosis    pt unsure of this   RLS (restless legs syndrome)    Vertigo    Vitamin B 12 deficiency 04/09/2013    Past Surgical History:  Procedure Laterality Date   Arm surgery Left    "don't remember what they did; arm wasn't broken"   BILATERAL KNEE ARTHROSCOPY Bilateral    BIOPSY  08/07/2018   Procedure: BIOPSY;  Surgeon: Ladene Artist, MD;  Location: [facility];  Service: Endoscopy;;    CARDIAC CATHETERIZATION  01/22/10   clean cath   CATARACT EXTRACTION W/ INTRAOCULAR LENS  IMPLANT, BILATERAL Bilateral    CHOLECYSTECTOMY N/A 10/25/2016   Procedure: LAPAROSCOPIC CHOLECYSTECTOMY;  Surgeon: Georganna Skeans, MD;  Location: [facility];  Service: General;  Laterality: N/A;   COLONOSCOPY     COLONOSCOPY W/ POLYPECTOMY     ESOPHAGOGASTRODUODENOSCOPY (EGD) WITH PROPOFOL N/A 08/07/2018   Procedure: ESOPHAGOGASTRODUODENOSCOPY (EGD) WITH PROPOFOL;  Surgeon: Ladene Artist, MD;  Location: [facility];  Service: Endoscopy;  Laterality: N/A;   FOOT SURGERY Bilateral    toenails removed; callus removed on right; hammertoes right"   Knot     "removed from right neck; not a goiter"   LUMBAR DISC SURGERY     L5 S1 anterior fusion   OOPHORECTOMY     RIGHT/LEFT HEART CATH AND CORONARY ANGIOGRAPHY N/A 05/06/2017   Procedure: RIGHT/LEFT HEART CATH AND CORONARY ANGIOGRAPHY;  Surgeon: Nelva Bush, MD;  Location: [facility];  Service: Cardiovascular;  Laterality: N/A;   SHOULDER ARTHROSCOPY Right    TOTAL KNEE ARTHROPLASTY  10/05/2011   Procedure: TOTAL KNEE ARTHROPLASTY;  Surgeon: Hessie Dibble, MD;  Location:  [place] [facility];  Service: Orthopedics;  Laterality: Right;   UPPER GASTROINTESTINAL ENDOSCOPY     VAGINAL HYSTERECTOMY     for endometriosis    Allergies as of 08/21/2020       Reactions   Dextromethorphan-guaifenesin Nausea And Vomiting   Morphine Nausea And Vomiting   Gabapentin Other (See Comments)   Dizziness, made her feel loopy   Penicillins Rash   "> 30 years ago; best I can remember it was just a light rash on my arm" Did it involve swelling of the face/tongue/throat, SOB, or low BP? No Did it involve sudden or severe rash/hives, skin peeling, or any reaction on the inside of your mouth or nose? Unknown Did you need to seek medical attention at a hospital or doctor's office? No When did it last happen?      more than 30 years  If all above answers are "NO", may proceed  with cephalosporin use.        Medication List        Accurate as of August 21, 2020 11:59 PM. If you have any questions, ask your nurse or doctor.          STOP taking these medications    Vitamin D (Ergocalciferol) 1.25 MG (50000 UNIT) Caps capsule Commonly known as: DRISDOL Stopped by: Kathlene November, MD       TAKE these medications    albuterol 108 (90 Base) MCG/ACT inhaler Commonly known as: VENTOLIN HFA Inhale 2 puffs into the lungs every 6 (six) hours as needed for wheezing (Chest congestion).   cholecalciferol 1000 units tablet Commonly known as: VITAMIN D Take 4,000 Units by mouth daily.   dicyclomine 20 MG tablet Commonly known as: BENTYL Take 1 tablet (20 mg total) by mouth 4 (four) times daily -  before meals and at bedtime.   Eliquis 5 MG Tabs tablet Generic drug: apixaban Take 1 tablet (5 mg total) by mouth 2 (two) times daily.   Entresto 97-103 MG Generic drug: sacubitril-valsartan TAKE 1 TABLET TWICE A DAY   metoprolol succinate 50 MG 24 hr tablet Commonly known as: TOPROL-XL Take 0.5 tablets (25 mg total) by mouth daily.   Narcan 4 MG/0.1ML Liqd nasal spray kit Generic drug: naloxone Place 1 spray into the nose once as needed (opioid od).   nitroGLYCERIN 0.4 MG SL tablet Commonly known as: NITROSTAT Place 1 tablet (0.4 mg total) under the tongue every 5 (five) minutes as needed for chest pain.   omeprazole 40 MG capsule Commonly known as: PRILOSEC Take 1 capsule (40 mg total) by mouth 2 (two) times daily.   pregabalin 50 MG capsule Commonly known as: LYRICA Take 50 mg by mouth 3 (three) times daily.   rOPINIRole 1 MG tablet Commonly known as: REQUIP Take 1 tablet (1 mg total) by mouth 2 (two) times daily.   sertraline 50 MG tablet Commonly known as: ZOLOFT Take 1.5 tablets (75 mg total) by mouth daily.   traZODone 50 MG tablet Commonly known as: DESYREL Take 1-2 tablets (50-100 mg total) by mouth at bedtime.            Objective:   Physical Exam BP 126/80 (BP Location: Left Arm, Patient Position: Sitting, Cuff Size: Normal)   Pulse (!) 56   Temp 98.4 F (36.9 C) (Oral)   Resp 18   Ht '5\' 6"'  (1.676 m)   Wt 250 lb 2 oz (113.5 kg)   SpO2 97%   BMI 40.37 kg/m  General:  Well developed, NAD, BMI noted. HEENT:  Normocephalic . Face symmetric, atraumatic Lungs:  CTA B Normal respiratory effort, no intercostal retractions, no accessory muscle use. Heart: RRR,  no murmur.  Lower extremities: no pretibial edema bilaterally  Skin: Not pale. Not jaundice Neurologic:  alert & oriented X3.  Speech normal, gait appropriate for age and unassisted Psych--  Cognition and judgment appear intact.  Cooperative with normal attention span and concentration.  Behavior appropriate. No anxious or depressed appearing.      Assessment     Assessment HTN Hyperlipidemia-  Pravachol, Crestor: Myalgias. Intolerant to Zetia (joint aches), see OV  06/2016  Depression, Anxiety (on clonazepam), insomnia (on trazodone):  depression x many  years, remotely on zoloft and other meds per psych, I rx lexapro 2015, then was rx effexor x a while Morbid obesity CV: CHF/ non ischemic cardiomyopathy dx 04-2017; cath normal coronaries, EF ~ 30% Carotid artery disease: 1 to 39% bilateral carotids 09-2016, start aspirin per neurology GI:  --GERD, Barrett's esophagus, had a EGD 07/2018  --h/o persistent C. difficile diarrhea 2014 B12 deficiency Osteopenia: Dexa 2015 showed osteopenia, T score -1.9 (10/2016): Rx cs, vit D PULM: --NPSG 2008:  No osa, PLMS 4/hr with arousal and awakening --RLS - Requip  Migraines, f/u [facility] -- off topamax as off 06-2016 MSK: --Back pain, chronic: pain meds rx by ortho, Workers comp MD @ [employer] pain mngmt [place] --DJD,CTS B ([facility]) --Fibromyalgia.  See note from 02/14/2018 OAB, LUTS-- on myrbetriq Dr McDarmoth  Raynaud phenomena---- DX 07/2017 COVID-19, pulmonary emboli, monoclonal  infusion 11/13/2019  PLAN HTN: Good compliance with metoprolol, Entresto, states that her BP cuff at home is not reliable.  Checking labs, BP today is very good. Depression, anxiety: Back on sertraline, patient not sure if it is helping, denies suicidal ideas, declined to change or increase meds Fatigue: See last visit, labs were okay except for low vitamin D, supplements were Rx. Fatigue unchanged, was referred to pulmonary for further eval ([facility]), referral failed, will try again Morbid obesity: Lost 3 pounds. Praised  GERD, Barrett's esophagus: Saw GI on May, next EGD 07/2021. Vaccine advise: Td 2012, rec booster PNM 23: 2012, booster today Prevnar 2015 Shingrix: Discussed and recommended COVID-vaccine: Strongly declines RTC 3 months   This visit occurred during the SARS-CoV-2 public health emergency.  Safety protocols were in place, including screening questions prior to the visit, additional usage of staff PPE, and extensive cleaning of exam room while observing appropriate contact time as indicated for disinfecting solutions.

## 2020-08-21 NOTE — Patient Instructions (Addendum)
We have placed an order for your mammogram. Please stop downstairs ([address]) on your way out to schedule that at your convenience.   Please make an appointment with your heart doctors  Please make an appointment with the pulmonary doctors.  I will asked them to evaluate you for sleep apnea. Call [phone]  Check the  blood pressure regularly BP GOAL is between 110/65 and  135/85. If it is consistently higher or lower, let me know    GO TO THE LAB : Get the blood work    [name], [name] back for a checkup in 3 months

## 2020-08-22 NOTE — Assessment & Plan Note (Signed)
HTN: Good compliance with metoprolol, Entresto, states that her BP cuff at home is not reliable.  Checking labs, BP today is very good. Depression, anxiety: Back on sertraline, patient not sure if it is helping, denies suicidal ideas, declined to change or increase meds Fatigue: See last visit, labs were okay except for low vitamin D, supplements were Rx. Fatigue unchanged, was referred to pulmonary for further eval ([facility]), referral failed, will try again Morbid obesity: Lost 3 pounds. Praised  GERD, Barrett's esophagus: Saw GI on May, next EGD 07/2021. Vaccine advise: Td 2012, rec booster PNM 23: 2012, booster today Prevnar 2015 Shingrix: Discussed and recommended COVID-vaccine: Strongly declines RTC 3 months

## 2020-08-27 ENCOUNTER — Telehealth: Payer: Self-pay | Admitting: Internal Medicine

## 2020-08-27 ENCOUNTER — Telehealth: Payer: Self-pay | Admitting: Gastroenterology

## 2020-08-27 NOTE — Telephone Encounter (Signed)
Patient has had 4 days of epigastric pain, abdominal pain, and some dry heaves.  She will come in the am and see Amy Esterwood PA at 8:30

## 2020-08-27 NOTE — Telephone Encounter (Signed)
Pt has been having pain/pressure in upper abdomen for 3-4 days. Cannot even wear a bra. Has had some dry heaves and a small amount of vomit die to the sensation. Wants to know if she can see you or if she needs to see Dr Fuller Plan.

## 2020-08-27 NOTE — Telephone Encounter (Signed)
Patient calling to inform she is experiencing chronic abd pain. Pt wants to know how to treat sxs... Plz advise   thank you

## 2020-08-27 NOTE — Telephone Encounter (Signed)
Spoke w/ Pt- denies fever, severe abd pain, recommended she reach out to Dr. Lynne Leader office to see what they recommend, however I imagine they are going to recommend UC or ED eval.

## 2020-08-28 ENCOUNTER — Other Ambulatory Visit (INDEPENDENT_AMBULATORY_CARE_PROVIDER_SITE_OTHER): Payer: Medicare Other

## 2020-08-28 ENCOUNTER — Encounter: Payer: Self-pay | Admitting: Physician Assistant

## 2020-08-28 ENCOUNTER — Ambulatory Visit (INDEPENDENT_AMBULATORY_CARE_PROVIDER_SITE_OTHER): Payer: Medicare Other | Admitting: Physician Assistant

## 2020-08-28 VITALS — BP 138/80 | HR 75 | Ht 66.0 in | Wt 245.4 lb

## 2020-08-28 DIAGNOSIS — R197 Diarrhea, unspecified: Secondary | ICD-10-CM

## 2020-08-28 DIAGNOSIS — R112 Nausea with vomiting, unspecified: Secondary | ICD-10-CM

## 2020-08-28 DIAGNOSIS — R1013 Epigastric pain: Secondary | ICD-10-CM

## 2020-08-28 LAB — CBC WITH DIFFERENTIAL/PLATELET
Basophils Absolute: 0 10*3/uL (ref 0.0–0.1)
Basophils Relative: 0.6 % (ref 0.0–3.0)
Eosinophils Absolute: 0.1 10*3/uL (ref 0.0–0.7)
Eosinophils Relative: 1 % (ref 0.0–5.0)
HCT: 40.7 % (ref 36.0–46.0)
Hemoglobin: 13.3 g/dL (ref 12.0–15.0)
Lymphocytes Relative: 32.9 % (ref 12.0–46.0)
Lymphs Abs: 2.1 10*3/uL (ref 0.7–4.0)
MCHC: 32.7 g/dL (ref 30.0–36.0)
MCV: 88.2 fl (ref 78.0–100.0)
Monocytes Absolute: 0.4 10*3/uL (ref 0.1–1.0)
Monocytes Relative: 6.4 % (ref 3.0–12.0)
Neutro Abs: 3.7 10*3/uL (ref 1.4–7.7)
Neutrophils Relative %: 59.1 % (ref 43.0–77.0)
Platelets: 212 10*3/uL (ref 150.0–400.0)
RBC: 4.61 Mil/uL (ref 3.87–5.11)
RDW: 13.9 % (ref 11.5–15.5)
WBC: 6.2 10*3/uL (ref 4.0–10.5)

## 2020-08-28 LAB — COMPREHENSIVE METABOLIC PANEL
ALT: 9 U/L (ref 0–35)
AST: 14 U/L (ref 0–37)
Albumin: 4 g/dL (ref 3.5–5.2)
Alkaline Phosphatase: 82 U/L (ref 39–117)
BUN: 15 mg/dL (ref 6–23)
CO2: 27 mEq/L (ref 19–32)
Calcium: 8.7 mg/dL (ref 8.4–10.5)
Chloride: 103 mEq/L (ref 96–112)
Creatinine, Ser: 0.93 mg/dL (ref 0.40–1.20)
GFR: 59.95 mL/min — ABNORMAL LOW (ref >60.00)
Glucose, Bld: 104 mg/dL — ABNORMAL HIGH (ref 70–99)
Potassium: 3.8 mEq/L (ref 3.5–5.1)
Sodium: 140 mEq/L (ref 135–145)
Total Bilirubin: 0.6 mg/dL (ref 0.2–1.2)
Total Protein: 7.2 g/dL (ref 6.0–8.3)

## 2020-08-28 LAB — LIPASE: Lipase: 6 U/L — ABNORMAL LOW (ref 11.0–59.0)

## 2020-08-28 MED ORDER — ONDANSETRON HCL 4 MG PO TABS: 4.0000 mg | ORAL_TABLET | Freq: Four times a day (QID) | ORAL | 1 refills | Status: DC | PRN

## 2020-08-28 NOTE — Progress Notes (Signed)
Reviewed and agree with management plan.  Shekina Cordell T. Tyliek Timberman, MD FACG 

## 2020-08-28 NOTE — Patient Instructions (Addendum)
If you are age 76 or older, your body mass index should be between 23-30. Your Body mass index is 39.6 kg/m. If this is out of the aforementioned range listed, please consider follow up with your Primary Care Provider. _________________________________________________________  The [facility] providers would like to encourage you to use [facility] to communicate with providers for non-urgent requests or questions.  Due to long hold times on the telephone, sending your provider a message by [facility] may be a faster and more efficient way to get a response.  Please allow 48 business hours for a response.  Please remember that this is for non-urgent requests.   Your provider has requested that you go to the basement level for lab work before leaving today. Press "B" on the elevator. The lab is located at the first door on the left as you exit the elevator.  START Ondansetron 4 mg 1 tablet every 6 hours as needed for nausea.  Continue Omeprazole twice daily  Continue dicyclomine  Take Imodium up to 6 times a day for diarrhea.  Push fluids and follow a bloat diet.  Call the office to speak to a nurse about how you are doing.  Thank you for entrusting me with your care and choosing [facility].  Amy Esterwood, PA-C

## 2020-08-28 NOTE — Progress Notes (Signed)
Subjective:    Patient ID: [name], female    DOB: 1945/06/10, 75 y.o.   MRN: [id number]  HPI [name] is a pleasant 75 year old white female, established with Dr. Fuller Plan, who comes in today with complaints of upper abdominal pain nausea vomiting and diarrhea x5 days. Patient has history of IBS, short segment Barrett's esophagus, colon polyps, history of biliary pancreatitis 2018, status postcholecystectomy, fibromyalgia, obesity, restless leg syndrome, nonischemic cardiomyopathy/congestive heart failure, hypertension and history of PE.  She is maintained on Eliquis.  Remote history of C. difficile colitis 2014 She had EGD in June 2020 which did show short segment Barrett's, small hiatal hernia and a few 3 to 4 mm sessile polyps.  Biopsy showed fundic gland polyps, and Barrett's esophagus without dysplasia. Last colonoscopy November 2016 with finding of a medium sized lipoma in the ascending colon, she had 2 small polyps removed both about 4 mm and biopsies showed these to be hyperplastic.  Patient says that she has had episodes of diarrhea in the past which she has treated with dicyclomine.  She has been more acutely ill over the past 5 days with fatigue, nausea, 1 episode of vomiting, dry heaves and diarrhea with about 4-6 episodes per day of liquid stool, nonbloody.  She is also had associated upper abdominal pain and cramping.  No complaints of arthralgias, no fever or chills no cough, no sore throat and no shortness of breath. She has not had any known infectious exposures, no recent new medications or changes in medications no recent antibiotics. She has been able to keep liquids down and has been eating very bland foods. She says the diarrhea has actually started to resolve as of today. She is concerned about being sick because she has a trip planned in 2 weeks and wants to be sure that she is feeling well.   Review of Systems Pertinent positive and negative review of systems were noted in  the above HPI section.  All other review of systems was otherwise negative.   Outpatient Encounter Medications as of 08/28/2020  Medication Sig   albuterol (VENTOLIN HFA) 108 (90 Base) MCG/ACT inhaler Inhale 2 puffs into the lungs every 6 (six) hours as needed for wheezing (Chest congestion).   apixaban (ELIQUIS) 5 MG TABS tablet Take 1 tablet (5 mg total) by mouth 2 (two) times daily.   cholecalciferol (VITAMIN D) 1000 units tablet Take 4,000 Units by mouth daily.   dicyclomine (BENTYL) 20 MG tablet Take 1 tablet (20 mg total) by mouth 4 (four) times daily -  before meals and at bedtime.   ENTRESTO 97-103 MG TAKE 1 TABLET TWICE A DAY   metoprolol succinate (TOPROL-XL) 50 MG 24 hr tablet Take 0.5 tablets (25 mg total) by mouth daily.   NARCAN 4 MG/0.1ML LIQD nasal spray kit Place 1 spray into the nose once as needed (opioid od).   omeprazole (PRILOSEC) 40 MG capsule Take 1 capsule (40 mg total) by mouth 2 (two) times daily.   ondansetron (ZOFRAN) 4 MG tablet Take 1 tablet (4 mg total) by mouth every 6 (six) hours as needed for nausea or vomiting.   pregabalin (LYRICA) 50 MG capsule Take 50 mg by mouth 3 (three) times daily.   rOPINIRole (REQUIP) 1 MG tablet Take 1 tablet (1 mg total) by mouth 2 (two) times daily.   sertraline (ZOLOFT) 50 MG tablet Take 1.5 tablets (75 mg total) by mouth daily.   traZODone (DESYREL) 50 MG tablet Take 1-2 tablets (50-100 mg  total) by mouth at bedtime.   [DISCONTINUED] nitroGLYCERIN (NITROSTAT) 0.4 MG SL tablet Place 1 tablet (0.4 mg total) under the tongue every 5 (five) minutes as needed for chest pain.   No facility-administered encounter medications on file as of 08/28/2020.   Allergies  Allergen Reactions   Dextromethorphan-Guaifenesin Nausea And Vomiting   Morphine Nausea And Vomiting   Gabapentin Other (See Comments)    Dizziness, made her feel loopy   Penicillins Rash    "> 30 years ago; best I can remember it was just a light rash on my arm" Did it  involve swelling of the face/tongue/throat, SOB, or low BP? No Did it involve sudden or severe rash/hives, skin peeling, or any reaction on the inside of your mouth or nose? Unknown Did you need to seek medical attention at a hospital or doctor's office? No When did it last happen?      more than 30 years  If all above answers are "NO", may proceed with cephalosporin use.    Patient Active Problem List   Diagnosis Date Noted   Pulmonary embolism ([place]) 12/07/2019   Cough 11/10/2019   Gastric polyps    Fibromyalgia, see office visit note from 02/14/2018 02/15/2018   Paresthesia 12/29/2017   Gait abnormality 09/21/2017   Dizziness 09/21/2017   Raynaud phenomenon 07/22/2017   Nonischemic cardiomyopathy ([place]) 05/18/2017   Chronic combined systolic and diastolic CHF (congestive heart failure) ([place]) 05/05/2017   Headache 05/05/2017   Chest pain 05/04/2017   Insomnia 10/23/2016   Morbid obesity ([place]) 12/16/2015   Myalgia 07/10/2015   Hyperlipidemia 07/10/2015   PCP NOTES >>>>>>>>>>>>>>>>> 10/31/2014   Anxiety and depression 09/10/2013   Vitamin B 12 deficiency 04/09/2013   Chronic cystitis 05/29/2012   Annual physical exam 06/14/2011   PARESTHESIA 04/01/2009   DJD (degenerative joint disease) 10/21/2008   BARRETTS ESOPHAGUS 07/19/2007   RESTLESS LEGS SYNDROME 05/11/2007   Migraine-- on topamax, rx by [facility] center 07/27/2006   Hypertension 07/27/2006   Osteopenia 07/27/2006   Chronic fatigue 07/27/2006   Social History   Socioeconomic History   Marital status: Divorced    Spouse name: Not on file   Number of children: 2   Years of education: 12   Highest education level: High school graduate  Occupational History   Occupation: Retired, post Ecologist: RETIRED  Tobacco Use   Smoking status: Former    Packs/day: 0.50    Years: 10.00    Pack years: 5.00    Types: Cigarettes    Quit date: 02/09/1975    Years since quitting: 45.5   Smokeless tobacco: Never    Tobacco comments:    started at age 57.   quit in the 1970s.  Vaping Use   Vaping Use: Never used  Substance and Sexual Activity   Alcohol use: Not Currently    Alcohol/week: 0.0 standard drinks    Comment: 2 glasses of wine per year   Drug use: No    Comment: CBD and hemp OIL    Sexual activity: Not Currently    Partners: Male  Other Topics Concern   Not on file  Social History Narrative   Son lives with her.   Right-handed.   2 soft drinks per day.   Social Determinants of Health   Financial Resource Strain: Low Risk    Difficulty of Paying Living Expenses: Not hard at all  Food Insecurity: No Food Insecurity   Worried About Charity fundraiser  in the Last Year: Never true   [place] in the Last Year: Never true  Transportation Needs: No Transportation Needs   Lack of Transportation (Medical): No   Lack of Transportation (Non-Medical): No  Physical Activity: Not on file  Stress: Not on file  Social Connections: Not on file  Intimate Partner Violence: Not on file    [name]'s family history includes Breast cancer in her sister; CAD in her father; Colon cancer in her maternal grandfather and maternal uncle; Congestive Heart Failure in her father; Dementia in her father; Diabetes in an other family member; Esophageal cancer in her brother; Hepatitis C in her brother; Lung cancer in her mother; Stroke in her father.      Objective:    Vitals:   08/28/20 0826  BP: 138/80  Pulse: 75  SpO2: 96%    Physical Exam Well-developed well-nourished overweight elderly female in no acute distress.  [place]  Weight, 245 BMI 39.6, ambulates with some difficulty  HEENT; nontraumatic normocephalic, EOMI, PE R LA, sclera anicteric. Oropharynx; not examined today Neck; supple, no JVD Cardiovascular; regular rate and rhythm with S1-S2, no murmur rub or gallop Pulmonary; Clear bilaterally Abdomen; soft, obese she has tenderness across the upper abdomen, no guarding or  rebound ,nondistended, no palpable mass or hepatosplenomegaly, bowel sounds are active Rectal; not done today Skin; benign exam, no jaundice rash or appreciable lesions Extremities; no clubbing cyanosis or edema skin warm and dry Neuro/Psych; alert and oriented x4, grossly nonfocal mood and affect appropriate        Assessment & Plan:   #17 75 year old white female with history of IBS and episodic diarrhea who presents with 5-day history of acute illness with fatigue, nausea vomiting dry heaves upper abdominal pain and diarrhea. She has been able to keep liquids down and soft bland foods.  Diarrhea is improving as of today and was having 4-6 episodes per day at its worst.  Nonbloody. Hemodynamically stable  I suspect she has had an infectious gastroenteritis, superimposed on IBS  #2 history of short segment Barrett's esophagus-up-to-date with surveillance EGDs #3 history of colon polyps-up-to-date with colonoscopy last done November 2016 with removal of 2 small hyperplastic polyps 4.  History of biliary pancreatitis 2018 status post cholecystectomy 5.  Fibromyalgia 6.  Congestive heart failure 7.  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  Hypertension 9.  Obesity  Plan Patient encouraged to push fluids, water Gatorade or Pedialyte Continue bland diet and gradually advance as tolerates CBC with differential, c-Met lipase Start Zofran 4 mg every 6 hours as needed for nausea She was advised to use Imodium up to 6/day as needed if diarrhea is persisting Continue dicyclomine 20 mg p.o. 3-4 times daily as needed. Continue omeprazole 40 mg p.o. twice daily Further recommendations pending results of labs. I have asked patient to call back in 3 to 4 days with progress report, and to call sooner for any worsening of symptoms.  Tavie Haseman Genia Harold PA-C 08/28/2020   Cc: Colon Branch, MD

## 2020-09-01 ENCOUNTER — Telehealth: Payer: Self-pay | Admitting: Physician Assistant

## 2020-09-01 NOTE — Telephone Encounter (Signed)
Inbound call from patient. Stating the medication have worked really well. Have been feeling good all weekend not getting sick. Was told to call to give an update. Best contact number [phone]

## 2020-09-05 ENCOUNTER — Ambulatory Visit (INDEPENDENT_AMBULATORY_CARE_PROVIDER_SITE_OTHER): Payer: Medicare Other | Admitting: Pulmonary Disease

## 2020-09-05 ENCOUNTER — Encounter: Payer: Self-pay | Admitting: Pulmonary Disease

## 2020-09-05 ENCOUNTER — Other Ambulatory Visit: Payer: Self-pay

## 2020-09-05 ENCOUNTER — Telehealth: Payer: Self-pay | Admitting: Pulmonary Disease

## 2020-09-05 VITALS — BP 134/82 | HR 66 | Temp 98.7°F | Ht 66.0 in | Wt 250.8 lb

## 2020-09-05 DIAGNOSIS — U099 Post covid-19 condition, unspecified: Secondary | ICD-10-CM | POA: Diagnosis not present

## 2020-09-05 DIAGNOSIS — G4733 Obstructive sleep apnea (adult) (pediatric): Secondary | ICD-10-CM | POA: Diagnosis not present

## 2020-09-05 DIAGNOSIS — I2699 Other pulmonary embolism without acute cor pulmonale: Secondary | ICD-10-CM

## 2020-09-05 LAB — D-DIMER, QUANTITATIVE: D-Dimer, Quant: 0.22 mcg/mL FEU (ref <0.50)

## 2020-09-05 NOTE — Addendum Note (Signed)
Addended by: Suzzanne Cloud E on: 09/05/2020 10:24 AM   Modules accepted: Orders

## 2020-09-05 NOTE — Telephone Encounter (Signed)
Pt is returning phone call believes it may be in regards to her lab results. Pls regard; [phone]

## 2020-09-05 NOTE — Telephone Encounter (Signed)
Call returned to patient, confirmed DOB. Made aware of lab results:    Voiced understanding. Aware to stop eliquis. Nothing further needed at this time.

## 2020-09-05 NOTE — Addendum Note (Signed)
Addended by: Elton Sin on: 09/05/2020 10:20 AM   Modules accepted: Orders

## 2020-09-05 NOTE — Patient Instructions (Signed)
We will check a blood test called D-dimer.  If normal then we can stop the Eliquis We will order home sleep study and high-resolution CT chest in October 2022 for follow-up of post COVID-19 Follow-up in clinic after CT chest

## 2020-09-05 NOTE — Progress Notes (Signed)
[name]    [id number]    Dec 15, 1945  Primary Care Physician:Paz, Alda Berthold, MD  Referring Physician: Colon Branch, MD [address] [place],  [place] [address]  Chief complaint: Follow-up for post COVID-19, PE  HPI: [name] is a 75 yo CF with PMH HTN, chronic s/dCHF, nonischemic CM on cath 04/2017 Barrett's esophagus, IBS-D, osteopenia, OAB, DJD and whom also had COVID pna diagnosed on 11/10/19. She did not require hospitalization.  She was treated with a monoclonal antibody infusion on 11/13/2019, a Z-Pak, and supportive measures and recovered at home but was largely immobile  She was diagnosed on 10/28 with right lower lobe PE and CT changes consistent with ongoing COVID-19 pneumonia started on Eliquis.  Briefly hospitalized from 10/29-10/30 for dyspnea.  Post discharge he is making slow improvements.  Has mild dyspnea on exertion.  Denies any cough, fevers.  Pets: 2 dogs, cat Occupation: Used to work in the post office Exposures: No known exposures.  No mold, hot tub, Jacuzzi Smoking history: Smoked in the early 20s Travel history: No significant travel history Relevant family history: No significant family issue of lung disease  Interim history: Continues on Eliquis anticoagulation States that breathing is stable with chronic dyspnea on exertion without change Reports fatigue, excessive daytime sleepiness  Outpatient Encounter Medications as of 09/05/2020  Medication Sig   albuterol (VENTOLIN HFA) 108 (90 Base) MCG/ACT inhaler Inhale 2 puffs into the lungs every 6 (six) hours as needed for wheezing (Chest congestion).   apixaban (ELIQUIS) 5 MG TABS tablet Take 1 tablet (5 mg total) by mouth 2 (two) times daily.   cholecalciferol (VITAMIN D) 1000 units tablet Take 4,000 Units by mouth daily.   dicyclomine (BENTYL) 20 MG tablet Take 1 tablet (20 mg total) by mouth 4 (four) times daily -  before meals and at bedtime.   ENTRESTO 97-103 MG TAKE 1 TABLET  TWICE A DAY   metoprolol succinate (TOPROL-XL) 50 MG 24 hr tablet Take 0.5 tablets (25 mg total) by mouth daily.   NARCAN 4 MG/0.1ML LIQD nasal spray kit Place 1 spray into the nose once as needed (opioid od).   omeprazole (PRILOSEC) 40 MG capsule Take 1 capsule (40 mg total) by mouth 2 (two) times daily.   ondansetron (ZOFRAN) 4 MG tablet Take 1 tablet (4 mg total) by mouth every 6 (six) hours as needed for nausea or vomiting.   pregabalin (LYRICA) 50 MG capsule Take 50 mg by mouth 3 (three) times daily.   rOPINIRole (REQUIP) 1 MG tablet Take 1 tablet (1 mg total) by mouth 2 (two) times daily.   sertraline (ZOLOFT) 50 MG tablet Take 1.5 tablets (75 mg total) by mouth daily.   traZODone (DESYREL) 50 MG tablet Take 1-2 tablets (50-100 mg total) by mouth at bedtime.   No facility-administered encounter medications on file as of 09/05/2020.    Physical Exam: Blood pressure 126/78, pulse (!) 108, temperature (!) 97.2 F (36.2 C), temperature source Skin, height '5\' 6"'  (1.676 m), weight 230 lb 12.8 oz (104.7 kg), SpO2 96 %. Gen:      No acute distress HEENT:  EOMI, sclera anicteric Neck:     No masses; no thyromegaly Lungs:    Clear to auscultation bilaterally; normal respiratory effort CV:         Regular rate and rhythm; no murmurs Abd:      + bowel sounds; soft, non-tender; no palpable masses,  no distension Ext:    No edema; adequate peripheral perfusion Skin:      Warm and dry; no rash Neuro: alert and oriented x 3 Psych: normal mood and affect  Data Reviewed: Imaging: CTA 12/06/2019-filling defect in the right lower lobe, bilateral airspace opacities, 8 mm right upper lobe nodule.  I have reviewed the images personally.  PFTs:  Labs:  Sleep tests: PSG 10/25/06 >> [facility] 3  Assessment:  Post COVID-19, pulmonary embolism in the setting of COVID-19 infection and immobility Overall she has recovered from a COVID-19.  She still has dyspnea on exertion which I think is from her body  habitus and deconditioning.  We will order a high-resolution CT for follow-up  She does not need indefinite anticoagulation since PE is in the setting of COVID-19 and immobility Will check D-dimer today and if normal stop Eliquis  Subcentimeter pulmonary nodule Will review on upcoming high-res CT chest  Daytime fatigue, somnolence Previously followed by Dr. Halford Chessman for restless leg syndrome, currently on Requip Sleep study in 2008 was normal.  Will need repeat study due to worsening symptoms  Plan/Recommendations: Continue Eliquis anticoagulation Check D-dimer High-resolution CT chest in October 2022 Home sleep study  Marshell Garfinkel MD [facility] 09/05/2020, 10:00 AM  CC: Colon Branch, MD

## 2020-09-08 DIAGNOSIS — Z20822 Contact with and (suspected) exposure to covid-19: Secondary | ICD-10-CM | POA: Diagnosis not present

## 2020-09-08 NOTE — Telephone Encounter (Signed)
Inbound call from patient. States she is back feeling horrible with nausea again and would like a call back

## 2020-09-08 NOTE — Telephone Encounter (Signed)
Patient was constipated last week. Her bowels are still not right. She eats and she feels cramps. She becomes nauseated. She has not taken anything for the symptoms. Her IBS is not normally constipation. Please advise.

## 2020-09-08 NOTE — Telephone Encounter (Signed)
Patient advised.

## 2020-09-11 ENCOUNTER — Other Ambulatory Visit: Payer: Self-pay

## 2020-09-11 MED ORDER — ONDANSETRON HCL 4 MG PO TABS
ORAL_TABLET | ORAL | 1 refills | Status: DC
Start: 2020-09-11 — End: 2021-09-23

## 2020-10-04 ENCOUNTER — Other Ambulatory Visit: Payer: Self-pay | Admitting: Internal Medicine

## 2020-10-07 ENCOUNTER — Ambulatory Visit (HOSPITAL_BASED_OUTPATIENT_CLINIC_OR_DEPARTMENT_OTHER): Payer: Medicare Other

## 2020-10-07 ENCOUNTER — Other Ambulatory Visit: Payer: Self-pay | Admitting: Cardiology

## 2020-10-09 DIAGNOSIS — Z20822 Contact with and (suspected) exposure to covid-19: Secondary | ICD-10-CM | POA: Diagnosis not present

## 2020-10-10 DIAGNOSIS — Z20822 Contact with and (suspected) exposure to covid-19: Secondary | ICD-10-CM | POA: Diagnosis not present

## 2020-10-11 ENCOUNTER — Ambulatory Visit (INDEPENDENT_AMBULATORY_CARE_PROVIDER_SITE_OTHER): Payer: Medicare Other

## 2020-10-11 VITALS — Ht 66.0 in | Wt 243.0 lb

## 2020-10-11 DIAGNOSIS — Z9114 Patient's other noncompliance with medication regimen: Secondary | ICD-10-CM

## 2020-10-11 DIAGNOSIS — Z0000 Encounter for general adult medical examination without abnormal findings: Secondary | ICD-10-CM | POA: Diagnosis not present

## 2020-10-11 NOTE — Patient Instructions (Signed)
Ms. [name] , Thank you for taking time to come for your Medicare Wellness Visit. I appreciate your ongoing commitment to your health goals. Please review the following plan we discussed and let me know if I can assist you in the future.   Screening recommendations/referrals: Colonoscopy: Done 12/18/2014 - no repeat required Mammogram: Done 02/15/2019 - reschedule appointment soon - Repeat annually  Bone Density: Done 02/15/2019 - Repeat every 2 years Recommended yearly ophthalmology/optometry visit for glaucoma screening and checkup Recommended yearly dental visit for hygiene and checkup  Vaccinations: Influenza vaccine: Due every fall Pneumococcal vaccine: Done 09/10/2013 & 7/41/2022 Tdap vaccine: Done 04/14/2010 - Repeat in 10 years *Due Shingles vaccine: Zostavax done 2015, Shingrix discussed. Please contact your pharmacy for coverage information.     Covid-19: Declined  Advanced directives: Please bring a copy of your health care power of attorney and living will to the office to be added to your chart at your convenience.   Conditions/risks identified: Aim for 30 minutes of exercise or brisk walking each day, drink 6-8 glasses of water and eat lots of fruits and vegetables.   Next appointment: Follow up in one year for your annual wellness visit    Preventive Care 65 Years and Older, Female Preventive care refers to lifestyle choices and visits with your health care provider that can promote health and wellness. What does preventive care include? A yearly physical exam. This is also called an annual well check. Dental exams once or twice a year. Routine eye exams. Ask your health care provider how often you should have your eyes checked. Personal lifestyle choices, including: Daily care of your teeth and gums. Regular physical activity. Eating a healthy diet. Avoiding tobacco and drug use. Limiting alcohol use. Practicing safe sex. Taking low-dose aspirin every day. Taking vitamin  and mineral supplements as recommended by your health care provider. What happens during an annual well check? The services and screenings done by your health care provider during your annual well check will depend on your age, overall health, lifestyle risk factors, and family history of disease. Counseling  Your health care provider may ask you questions about your: Alcohol use. Tobacco use. Drug use. Emotional well-being. Home and relationship well-being. Sexual activity. Eating habits. History of falls. Memory and ability to understand (cognition). Work and work Statistician. Reproductive health. Screening  You may have the following tests or measurements: Height, weight, and BMI. Blood pressure. Lipid and cholesterol levels. These may be checked every 5 years, or more frequently if you are over 21 years old. Skin check. Lung cancer screening. You may have this screening every year starting at age 76 if you have a 30-pack-year history of smoking and currently smoke or have quit within the past 15 years. Fecal occult blood test (FOBT) of the stool. You may have this test every year starting at age 18. Flexible sigmoidoscopy or colonoscopy. You may have a sigmoidoscopy every 5 years or a colonoscopy every 10 years starting at age 28. Hepatitis C blood test. Hepatitis B blood test. Sexually transmitted disease (STD) testing. Diabetes screening. This is done by checking your blood sugar (glucose) after you have not eaten for a while (fasting). You may have this done every 1-3 years. Bone density scan. This is done to screen for osteoporosis. You may have this done starting at age 55. Mammogram. This may be done every 1-2 years. Talk to your health care provider about how often you should have regular mammograms. Talk with your health care  provider about your test results, treatment options, and if necessary, the need for more tests. Vaccines  Your health care provider may recommend  certain vaccines, such as: Influenza vaccine. This is recommended every year. Tetanus, diphtheria, and acellular pertussis (Tdap, Td) vaccine. You may need a Td booster every 10 years. Zoster vaccine. You may need this after age 35. Pneumococcal 13-valent conjugate (PCV13) vaccine. One dose is recommended after age 55. Pneumococcal polysaccharide (PPSV23) vaccine. One dose is recommended after age 61. Talk to your health care provider about which screenings and vaccines you need and how often you need them. This information is not intended to replace advice given to you by your health care provider. Make sure you discuss any questions you have with your health care provider. Document Released: 02/21/2015 Document Revised: 10/15/2015 Document Reviewed: 11/26/2014 Elsevier Interactive Patient Education  2017 [place] Prevention in the Home Falls can cause injuries. They can happen to people of all ages. There are many things you can do to make your home safe and to help prevent falls. What can I do on the outside of my home? Regularly fix the edges of walkways and driveways and fix any cracks. Remove anything that might make you trip as you walk through a door, such as a raised step or threshold. Trim any bushes or trees on the path to your home. Use bright outdoor lighting. Clear any walking paths of anything that might make someone trip, such as rocks or tools. Regularly check to see if handrails are loose or broken. Make sure that both sides of any steps have handrails. Any raised decks and porches should have guardrails on the edges. Have any leaves, snow, or ice cleared regularly. Use sand or salt on walking paths during winter. Clean up any spills in your garage right away. This includes oil or grease spills. What can I do in the bathroom? Use night lights. Install grab bars by the toilet and in the tub and shower. Do not use towel bars as grab bars. Use non-skid mats or  decals in the tub or shower. If you need to sit down in the shower, use a plastic, non-slip stool. Keep the floor dry. Clean up any water that spills on the floor as soon as it happens. Remove soap buildup in the tub or shower regularly. Attach bath mats securely with double-sided non-slip rug tape. Do not have throw rugs and other things on the floor that can make you trip. What can I do in the bedroom? Use night lights. Make sure that you have a light by your bed that is easy to reach. Do not use any sheets or blankets that are too big for your bed. They should not hang down onto the floor. Have a firm chair that has side arms. You can use this for support while you get dressed. Do not have throw rugs and other things on the floor that can make you trip. What can I do in the kitchen? Clean up any spills right away. Avoid walking on wet floors. Keep items that you use a lot in easy-to-reach places. If you need to reach something above you, use a strong step stool that has a grab bar. Keep electrical cords out of the way. Do not use floor polish or wax that makes floors slippery. If you must use wax, use non-skid floor wax. Do not have throw rugs and other things on the floor that can make you trip. What can I  do with my stairs? Do not leave any items on the stairs. Make sure that there are handrails on both sides of the stairs and use them. Fix handrails that are broken or loose. Make sure that handrails are as long as the stairways. Check any carpeting to make sure that it is firmly attached to the stairs. Fix any carpet that is loose or worn. Avoid having throw rugs at the top or bottom of the stairs. If you do have throw rugs, attach them to the floor with carpet tape. Make sure that you have a light switch at the top of the stairs and the bottom of the stairs. If you do not have them, ask someone to add them for you. What else can I do to help prevent falls? Wear shoes that: Do not  have high heels. Have rubber bottoms. Are comfortable and fit you well. Are closed at the toe. Do not wear sandals. If you use a stepladder: Make sure that it is fully opened. Do not climb a closed stepladder. Make sure that both sides of the stepladder are locked into place. Ask someone to hold it for you, if possible. Clearly mark and make sure that you can see: Any grab bars or handrails. First and last steps. Where the edge of each step is. Use tools that help you move around (mobility aids) if they are needed. These include: Canes. Walkers. Scooters. Crutches. Turn on the lights when you go into a dark area. Replace any light bulbs as soon as they burn out. Set up your furniture so you have a clear path. Avoid moving your furniture around. If any of your floors are uneven, fix them. If there are any pets around you, be aware of where they are. Review your medicines with your doctor. Some medicines can make you feel dizzy. This can increase your chance of falling. Ask your doctor what other things that you can do to help prevent falls. This information is not intended to replace advice given to you by your health care provider. Make sure you discuss any questions you have with your health care provider. Document Released: 11/21/2008 Document Revised: 07/03/2015 Document Reviewed: 03/01/2014 Elsevier Interactive Patient Education  2017 [employer].

## 2020-10-11 NOTE — Addendum Note (Signed)
Addended by: Adalberto Cole E on: 10/11/2020 09:31 AM   Modules accepted: Orders

## 2020-10-11 NOTE — Progress Notes (Addendum)
Subjective:   [name] is a 75 y.o. female who presents for Medicare Annual (Subsequent) preventive examination.  Virtual Visit via Telephone Note  I connected with  [name] on 10/11/20 at  9:20 AM EDT by telephone and verified that I am speaking with the correct person using two identifiers.  Location: Patient: Home Provider: [facility] Persons participating in the virtual visit: patient/Nurse Health Advisor   I discussed the limitations, risks, security and privacy concerns of performing an evaluation and management service by telephone and the availability of in person appointments. The patient expressed understanding and agreed to proceed.  Interactive audio and video telecommunications were attempted between this nurse and patient, however failed, due to patient having technical difficulties OR patient did not have access to video capability.  We continued and completed visit with audio only.  Some vital signs may be absent or patient reported.   Arabella Revelle E Aerika Groll, LPN   Review of Systems     Cardiac Risk Factors include: advanced age (>50mn, >>31women);dyslipidemia;hypertension;obesity (BMI >30kg/m2);sedentary lifestyle;Other (see comment), Risk factor comments: atherosclerosis, hx of PE     Objective:    Today's Vitals   10/11/20 0856 10/11/20 0857  Weight: 243 lb (110.2 kg)   Height: '5\' 6"'  (1.676 m)   PainSc:  5    Body mass index is 39.22 kg/m.  Advanced Directives 10/11/2020 12/07/2019 12/07/2019 10/01/2019 09/13/2019 09/28/2018 08/07/2018  Does Patient Have a Medical Advance Directive? Yes Yes No Yes Yes No No;Yes  Type of AParamedicof APanamaLiving will HFranklin LakesLiving will - HBowling GreenLiving will HRiver BluffLiving will - HWorld Golf VillageLiving will  Does patient want to make changes to medical advance directive? - No - Patient declined - No - Patient declined No -  Patient declined - -  Copy of HLos Hu[place]aches[place] Chart? No - copy requested No - copy requested - No - copy requested - - No - copy requested  Would patient like information on creating a medical advance directive? - - - - - No - Patient declined -  Pre-existing out of facility DNR order (yellow form or pink MOST form) - - - - - - -    Current Medications (verified) Outpatient Encounter Medications as of 10/11/2020  Medication Sig   albuterol (VENTOLIN HFA) 108 (90 Base) MCG/ACT inhaler Inhale 2 puffs into the lungs every 6 (six) hours as needed for wheezing (Chest congestion).   cholecalciferol (VITAMIN D) 1000 units tablet Take 4,000 Units by mouth daily.   dicyclomine (BENTYL) 20 MG tablet Take 1 tablet (20 mg total) by mouth 4 (four) times daily -  before meals and at bedtime.   ENTRESTO 97-103 MG TAKE 1 TABLET TWICE A DAY   HYDROcodone-acetaminophen (NORCO) 10-325 MG tablet Take 1 tablet by mouth every 6 (six) hours as needed.   omeprazole (PRILOSEC) 40 MG capsule Take 1 capsule (40 mg total) by mouth 2 (two) times daily.   ondansetron (ZOFRAN) 4 MG tablet Take 1 tablet by mouth 30 minutes ac meals and hs x 3 days then PRN   pregabalin (LYRICA) 75 MG capsule Take 75 mg by mouth 3 (three) times daily.   rOPINIRole (REQUIP) 1 MG tablet TAKE 1 TABLET BY MOUTH TWICE A DAY   sertraline (ZOLOFT) 50 MG tablet Take 1.5 tablets (75 mg total) by mouth daily.   traZODone (DESYREL) 50 MG tablet Take 1-2 tablets (50-100 mg total)  by mouth at bedtime.   [DISCONTINUED] dicyclomine (BENTYL) 20 MG tablet Take by mouth.   [DISCONTINUED] naloxone (NARCAN) nasal spray 4 mg/0.1 mL Place into the nose.   apixaban (ELIQUIS) 5 MG TABS tablet Take 1 tablet (5 mg total) by mouth 2 (two) times daily. (Patient not taking: Reported on 10/11/2020)   metoprolol succinate (TOPROL-XL) 50 MG 24 hr tablet Take 0.5 tablets (25 mg total) by mouth daily. (Patient not taking: Reported on 10/11/2020)   NARCAN 4 MG/0.1ML  LIQD nasal spray kit Place 1 spray into the nose once as needed (opioid od).   [DISCONTINUED] pregabalin (LYRICA) 50 MG capsule Take 50 mg by mouth 3 (three) times daily.   No facility-administered encounter medications on file as of 10/11/2020.    Allergies (verified) Dextromethorphan-guaifenesin, Morphine, Gabapentin, and Penicillins   History: Past Medical History:  Diagnosis Date   Allergy    Anemia    Anxiety    Barrett's esophagus    Bilateral carpal tunnel syndrome    Dr. Eddie Dibbles, having injection therapy   C. difficile diarrhea 08/01/2012   severe 2014   Carotid arterial disease ([place])    Cataract    removed both eyes    Chronic combined systolic and diastolic CHF (congestive heart failure) ([place])    Chronic cystitis    Dr. Matilde Sprang   Chronic lower back pain    Chronic pain syndrome    Depression    Dizziness    DJD (degenerative joint disease)    bilateral hands; knees   Family history of anesthesia complication    Mother had severe N/V   Fibromyalgia    GERD (gastroesophageal reflux disease)    H/O cardiac catheterization    (-) cath 12-2002  , cath again 2011 (-)   History of colon polyps    HTN (hypertension)    Hyperlipidemia    past hx    IBS (irritable bowel syndrome)    Insomnia    Migraine    Morbid obesity ([place])    Non-ischemic cardiomyopathy ([place])    Osteoporosis    pt unsure of this   RLS (restless legs syndrome)    Vertigo    Vitamin B 12 deficiency 04/09/2013   Past Surgical History:  Procedure Laterality Date   Arm surgery Left    "don't remember what they did; arm wasn't broken"   BILATERAL KNEE ARTHROSCOPY Bilateral    BIOPSY  08/07/2018   Procedure: BIOPSY;  Surgeon: Ladene Artist, MD;  Location: [facility];  Service: Endoscopy;;   CARDIAC CATHETERIZATION  01/22/10   clean cath   CATARACT EXTRACTION W/ INTRAOCULAR LENS  IMPLANT, BILATERAL Bilateral    CHOLECYSTECTOMY N/A 10/25/2016   Procedure: LAPAROSCOPIC CHOLECYSTECTOMY;   Surgeon: Georganna Skeans, MD;  Location: [facility];  Service: General;  Laterality: N/A;   COLONOSCOPY     COLONOSCOPY W/ POLYPECTOMY     ESOPHAGOGASTRODUODENOSCOPY (EGD) WITH PROPOFOL N/A 08/07/2018   Procedure: ESOPHAGOGASTRODUODENOSCOPY (EGD) WITH PROPOFOL;  Surgeon: Ladene Artist, MD;  Location: [facility];  Service: Endoscopy;  Laterality: N/A;   FOOT SURGERY Bilateral    toenails removed; callus removed on right; hammertoes right"   Knot     "removed from right neck; not a goiter"   LUMBAR DISC SURGERY     L5 S1 anterior fusion   OOPHORECTOMY     RIGHT/LEFT HEART CATH AND CORONARY ANGIOGRAPHY N/A 05/06/2017   Procedure: RIGHT/LEFT HEART CATH AND CORONARY ANGIOGRAPHY;  Surgeon: Nelva Bush, MD;  Location: [facility];  Service: Cardiovascular;  Laterality: N/A;   SHOULDER ARTHROSCOPY Right    TOTAL KNEE ARTHROPLASTY  10/05/2011   Procedure: TOTAL KNEE ARTHROPLASTY;  Surgeon: Hessie Dibble, MD;  Location: [facility];  Service: Orthopedics;  Laterality: Right;   UPPER GASTROINTESTINAL ENDOSCOPY     VAGINAL HYSTERECTOMY     for endometriosis   Family History  Problem Relation Age of Onset   Lung cancer Mother    Dementia Father    CAD Father        dx in his 72s   Stroke Father    Congestive Heart Failure Father    Colon cancer Maternal Grandfather    Esophageal cancer Brother    Breast cancer Sister    Diabetes Other        Aunt   Colon cancer Maternal Uncle        2   Hepatitis C Brother    Rectal cancer Neg Hx    Stomach cancer Neg Hx    Colon polyps Neg Hx    Social History   Socioeconomic History   Marital status: Divorced    Spouse name: Not on file   Number of children: 2   Years of education: 12   Highest education level: High school graduate  Occupational History   Occupation: Retired, post Ecologist: RETIRED  Tobacco Use   Smoking status: Former    Packs/day: 0.50    Years: 10.00    Pack years: 5.00    Types: Cigarettes    Quit  date: 02/09/1975    Years since quitting: 45.7   Smokeless tobacco: Never   Tobacco comments:    started at age 79.   quit in the 25s.  Vaping Use   Vaping Use: Never used  Substance and Sexual Activity   Alcohol use: Not Currently    Alcohol/week: 0.0 standard drinks    Comment: 2 glasses of wine per year   Drug use: No    Comment: CBD and hemp OIL    Sexual activity: Not Currently    Partners: Male  Other Topics Concern   Not on file  Social History Narrative   Son lives with her.   Right-handed.   2 soft drinks per day.   Social Determinants of Health   Financial Resource Strain: Low Risk    Difficulty of Paying Living Expenses: Not hard at all  Food Insecurity: No Food Insecurity   Worried About Charity fundraiser in the Last Year: Never true   Parkin in the Last Year: Never true  Transportation Needs: No Transportation Needs   Lack of Transportation (Medical): No   Lack of Transportation (Non-Medical): No  Physical Activity: Inactive   Days of Exercise per Week: 0 days   Minutes of Exercise per Session: 0 min  Stress: Stress Concern Present   Feeling of Stress : To some extent  Social Connections: Socially Isolated   Frequency of Communication with Friends and Family: More than three times a week   Frequency of Social Gatherings with Friends and Family: More than three times a week   Attends Religious Services: Never   Marine scientist or Organizations: No   Attends Archivist Meetings: Never   Marital Status: Divorced    Tobacco Counseling Counseling given: Not Answered Tobacco comments: started at age 69.   quit in the 1970s.   Clinical Intake:  Pre-visit preparation completed: Yes  Pain : 0-10 Pain Score: 5  Pain Type: Chronic pain Pain Location: Back Pain Descriptors / Indicators: Aching, Sore, Discomfort Pain Onset: More than a month ago Pain Frequency: Intermittent     BMI - recorded: 39.22 Nutritional Status: BMI  > 30  Obese Nutritional Risks: None Diabetes: No  How often do you need to have someone help you when you read instructions, pamphlets, or other written materials from your doctor or pharmacy?: 1 - Never  Diabetic? No  Interpreter Needed?: No  Information entered by :: Hayzlee Mcsorley, LPN   Activities of Daily Living In your present state of health, do you have any difficulty performing the following activities: 10/11/2020 12/07/2019  Hearing? N N  Vision? N N  Difficulty concentrating or making decisions? Y N  Walking or climbing stairs? Y Y  Dressing or bathing? N N  Doing errands, shopping? Tempie Donning  Preparing Food and eating ? N -  Using the Toilet? N -  In the past six months, have you accidently leaked urine? Y -  Comment wears depends - she cannot hold it long -  Do you have problems with loss of bowel control? N -  Managing your Medications? N -  Managing your Finances? N -  Housekeeping or managing your Housekeeping? N -  Some recent data might be hidden    Patient Care Team: Colon Branch, MD as PCP - General Jerline Pain, MD as PCP - Cardiology (Cardiology) Bjorn Loser, MD as Consulting Physician (Urology) Maia Breslow, MD as Consulting Physician (Orthopedic Surgery) Alden Hipp, MD as Consulting Physician (Obstetrics and Gynecology) Phylliss Bob, MD as Consulting Physician (Orthopedic Surgery) Red Christians, MD as Referring Physician (Pain Medicine)  Indicate any recent Medical Services you may have received from other than [facility] providers in the past year (date may be approximate).     Assessment:   This is a routine wellness examination for [facility].  Hearing/Vision screen Hearing Screening - Comments:: Denies hearing difficulties - has tinnitus  Vision Screening - Comments:: Wears glasses - up to date with annual eye exams with [facility] in [place]  Dietary issues and exercise activities discussed: Current Exercise Habits: The patient does not participate  in regular exercise at present, Exercise limited by: orthopedic condition(s)   Goals Addressed             This Visit's Progress    DIET - EAT MORE FRUITS AND VEGETABLES   On track      Depression Screen PHQ 2/9 Scores 10/11/2020 08/21/2020 04/15/2020 10/01/2019 07/04/2019 10/31/2018 09/28/2018  PHQ - 2 Score '4 4 4 1 2 2 1  ' PHQ- 9 Score '12 14 13 ' - 12 10 -  Exception Documentation - - - - - - -    Fall Risk Fall Risk  10/11/2020 08/21/2020 04/15/2020 02/18/2020 10/01/2019  Falls in the past year? 1 0 1 0 1  Number falls in past yr: 1 0 0 0 0  Injury with Fall? 0 0 0 0 0  Comment - - - - -  Risk for fall due to : History of fall(s);Orthopedic patient;Impaired balance/gait;Impaired vision;Medication side effect - - - -  Follow up Education provided;Falls prevention discussed Falls evaluation completed Falls evaluation completed - Education provided;Falls prevention discussed    FALL RISK PREVENTION PERTAINING TO THE HOME:  Any stairs in or around the home? No  If so, are there any without handrails? No  Home free of loose throw rugs in walkways, pet beds, electrical  cords, etc? Yes  Adequate lighting in your home to reduce risk of falls? Yes   ASSISTIVE DEVICES UTILIZED TO PREVENT FALLS:  Life alert? No  Use of a cane, walker or w/c? Yes  Grab bars in the bathroom? Yes  Shower chair or bench in shower? No  Elevated toilet seat or a handicapped toilet? No   TIMED UP AND GO:  Was the test performed? No . Telephonic visit  Cognitive Function:  MMSE - Mini Mental State Exam 09/23/2017 09/15/2016  Orientation to time 5 5  Orientation to Place 5 5  Registration 3 3  Attention/ Calculation 5 3  Recall 3 3  Language- name 2 objects 2 2  Language- repeat 1 1  Language- follow 3 step command 3 3  Language- read & follow direction 1 1  Write a sentence 1 1  Copy design 1 1  Total score 30 28     6CIT Screen 10/11/2020  What Year? 0 points  What month? 0 points  What time? 0 points   Count back from 20 0 points  Months in reverse 0 points  Repeat phrase 4 points  Total Score 4    Immunizations Immunization History  Administered Date(s) Administered   Fluad Quad(high Dose 65+) 10/31/2018   Influenza Split 02/17/2011   Influenza Whole 01/14/2010   Influenza, High Dose Seasonal PF 01/24/2015, 12/16/2017   Influenza,inj,Quad PF,6+ Mos 10/22/2013   Influenza-Unspecified 03/13/2016, 10/20/2016   Pneumococcal Conjugate-13 09/10/2013   Pneumococcal Polysaccharide-23 04/14/2010, 08/21/2020   Td 02/08/1998, 04/14/2010   Zoster, Live 12/06/2013    TDAP status: Due, Education has been provided regarding the importance of this vaccine. Advised may receive this vaccine at local pharmacy or Health Dept. Aware to provide a copy of the vaccination record if obtained from local pharmacy or Health Dept. Verbalized acceptance and understanding.  Flu Vaccine status: Up to date  Pneumococcal vaccine status: Up to date  Covid-19 vaccine status: Declined, Education has been provided regarding the importance of this vaccine but patient still declined. Advised may receive this vaccine at local pharmacy or Health Dept.or vaccine clinic. Aware to provide a copy of the vaccination record if obtained from local pharmacy or Health Dept. Verbalized acceptance and understanding.  Qualifies for Shingles Vaccine? Yes   Zostavax completed Yes   Shingrix Completed?: No.    Education has been provided regarding the importance of this vaccine. Patient has been advised to call insurance company to determine out of pocket expense if they have not yet received this vaccine. Advised may also receive vaccine at local pharmacy or Health Dept. Verbalized acceptance and understanding.  Screening Tests Health Maintenance  Topic Date Due   Zoster Vaccines- Shingrix (1 of 2) Never done   MAMMOGRAM  02/15/2020   TETANUS/TDAP  04/13/2020   INFLUENZA VACCINE  09/08/2020   COVID-19 Vaccine (1) 01/14/2021  (Originally 09/29/1950)   DEXA SCAN  Completed   Hepatitis C Screening  Completed   PNA vac Low Risk Adult  Completed   HPV VACCINES  Aged Out    Health Maintenance  Health Maintenance Due  Topic Date Due   Zoster Vaccines- Shingrix (1 of 2) Never done   MAMMOGRAM  02/15/2020   TETANUS/TDAP  04/13/2020   INFLUENZA VACCINE  09/08/2020    Colorectal cancer screening: No longer required.   Mammogram status: Ordered 08/2020. Pt provided with contact info and advised to call to schedule appt.   Bone Density status: Completed 02/15/2019. Results reflect: Bone density  results: OSTEOPENIA. Repeat every 2 years.  Lung Cancer Screening: (Low Dose CT Chest recommended if Age 45-80 years, 30 pack-year currently smoking OR have quit w/in 15years.) does not qualify.   Additional Screening:  Hepatitis C Screening: does qualify; Completed 06/14/2016  Vision Screening: Recommended annual ophthalmology exams for early detection of glaucoma and other disorders of the eye. Is the patient up to date with their annual eye exam?  Yes  Who is the provider or what is the name of the office in which the patient attends annual eye exams? [facility] If pt is not established with a provider, would they like to be referred to a provider to establish care? No .   Dental Screening: Recommended annual dental exams for proper oral hygiene  Community Resource Referral / Chronic Care Management: CRR required this visit?  No   CCM required this visit?  No      Plan:     I have personally reviewed and noted the following in the patient's chart:   Medical and social history Use of alcohol, tobacco or illicit drugs  Current medications and supplements including opioid prescriptions.  Functional ability and status Nutritional status Physical activity Advanced directives List of other physicians Hospitalizations, surgeries, and ER visits in previous 12 months Vitals Screenings to include cognitive,  depression, and falls Referrals and appointments  In addition, I have reviewed and discussed with patient certain preventive protocols, quality metrics, and best practice recommendations. A written personalized care plan for preventive services as well as general preventive health recommendations were provided to patient.     Sandrea Hammond, LPN   02/14/735   Nurse Notes: None   I have reviewed and agree with Health Coaches documentation.  Kathlene November, MD

## 2020-10-14 ENCOUNTER — Telehealth: Payer: Self-pay | Admitting: *Deleted

## 2020-10-14 NOTE — Chronic Care Management (AMB) (Signed)
  Chronic Care Management   Note  10/14/2020 Name: [name] MRN: [id number] DOB: November 30, 1945  [name] is a 75 y.o. year old female who is a primary care patient of Colon Branch, MD. I reached out to [facility] by phone today in response to a referral sent by Ms. [name]'s PCP Colon Branch, MD     [name] given information about Chronic Care Management services today including:  CCM service includes personalized support from designated clinical staff supervised by [name] physician, including individualized plan of care and coordination with other care providers 24/7 contact phone numbers for assistance for urgent and routine care needs. Service will only be billed when office clinical staff spend 20 minutes or more in a month to coordinate care. Only one practitioner may furnish and bill the service in a calendar month. The patient may stop CCM services at any time (effective at the end of the month) by phone call to the office staff. The patient will be responsible for cost sharing (co-pay) of up to 20% of the service fee (after annual deductible is met).  Patient agreed to services and verbal consent obtained.   Follow up plan: Telephone appointment with care management team member scheduled for: 10/21/2020  Julian Hy, [place] [facility]  Direct Dial: [phone]

## 2020-10-17 ENCOUNTER — Telehealth: Payer: Self-pay | Admitting: Pharmacist

## 2020-10-17 NOTE — Chronic Care Management (AMB) (Signed)
  Chronic Care Management Pharmacy Assistant   Name: [name]  MRN: [id number] DOB: 12/01/1945  [name] is an 75 y.o. year old female who presents for his initial CCM visit with the clinical pharmacist.  Recent office visits:  10/11/20-Amy Hopkins, LPN. Seen for Medicare Annual Wellness. AMB Referral to Community Care Coordination. Follow up in 1 year. 08/21/20-Jose E. Paz, MD (PCP) General follow up. Labs ordered. Mammogram ordered. Ambulatory referral to Pulmonology. Follow up in 3 months.  Recent consult visits:  09/05/20 (Pulmonology) Praveen Mannam, MD. Seen for follow up for post COVID-19. We will check a blood test called D-dimer. If normal then we can stop the Eliquis CT of chest ordered. Follow up in clinic after CT. 08/28/20 (Gastroenterology) Amy S.Esterwood, PA-C. Seen for abdominal pain. Labs ordered. Start Zofran 4 mg every 6 hours as needed for nausea. Was advised to use Imodium up to 6/day as needed if diarrhea is persisting 06/24/20 (Gastroenterology) Malcolm T. Stark, MD. Seen for Irritable bowel syndrome. 04/25/20-(Pain Medicine) Sarah B. Reynolds, PA-C. Seen for back pain and leg pain. Referral to Neurosurgery for lumbar spinal stenosis and right leg radiculopathy. Follow up in 12 weeks.  Hospital visits:  None in previous 6 months  Medications: Outpatient Encounter Medications as of 10/17/2020  Medication Sig   albuterol (VENTOLIN HFA) 108 (90 Base) MCG/ACT inhaler Inhale 2 puffs into the lungs every 6 (six) hours as needed for wheezing (Chest congestion).   apixaban (ELIQUIS) 5 MG TABS tablet Take 1 tablet (5 mg total) by mouth 2 (two) times daily. (Patient not taking: Reported on 10/11/2020)   cholecalciferol (VITAMIN D) 1000 units tablet Take 4,000 Units by mouth daily.   dicyclomine (BENTYL) 20 MG tablet Take 1 tablet (20 mg total) by mouth 4 (four) times daily -  before meals and at bedtime.   ENTRESTO 97-103 MG TAKE 1 TABLET TWICE A DAY    HYDROcodone-acetaminophen (NORCO) 10-325 MG tablet Take 1 tablet by mouth every 6 (six) hours as needed.   metoprolol succinate (TOPROL-XL) 50 MG 24 hr tablet Take 0.5 tablets (25 mg total) by mouth daily. (Patient not taking: Reported on 10/11/2020)   NARCAN 4 MG/0.1ML LIQD nasal spray kit Place 1 spray into the nose once as needed (opioid od).   omeprazole (PRILOSEC) 40 MG capsule Take 1 capsule (40 mg total) by mouth 2 (two) times daily.   ondansetron (ZOFRAN) 4 MG tablet Take 1 tablet by mouth 30 minutes ac meals and hs x 3 days then PRN   pregabalin (LYRICA) 75 MG capsule Take 75 mg by mouth 3 (three) times daily.   rOPINIRole (REQUIP) 1 MG tablet TAKE 1 TABLET BY MOUTH TWICE A DAY   sertraline (ZOLOFT) 50 MG tablet Take 1.5 tablets (75 mg total) by mouth daily.   traZODone (DESYREL) 50 MG tablet Take 1-2 tablets (50-100 mg total) by mouth at bedtime.   No facility-administered encounter medications on file as of 10/17/2020.   Albuterol (VENTOLIN HFA) 108 (90 Base) MCG/ACT inhaler Last filled:11/27/19 25 DS Apixaban (ELIQUIS) 5 MG TABS tablet Last filled:09/20/20 30 DS Cholecalciferol (VITAMIN D) 1000 units tablet Last filled:None noted Dicyclomine (BENTYL) 20 MG tablet Last filled:09/21/20 90 DS ENTRESTO 97-103 MG Last filled:10/08/20 90 DS HYDROcodone-acetaminophen (NORCO) 10-325 MG tablet Last filled:08/15/20 30 DS Metoprolol succinate (TOPROL-XL) 50 MG 24 hr tablet Last filled:11/07/18 90 DS NARCAN 4 MG/0.1ML LIQD nasal spray kit Last filled:None noted Omeprazole (PRILOSEC) 40 MG capsule Last filled:09/22/20 90 DS Ondansetron (ZOFRAN) 4   MG tablet Last filled:09/11/20 2 DS Pregabalin (LYRICA) 75 MG capsule Last filled:08/15/20 30 DS ROPINIRole (REQUIP) 1 MG tablet Last filled:08/02/20 90 DS Sertraline (ZOLOFT) 50 MG tablet Last filled:08/29/20 90 DS TraZODone (DESYREL) 50 MG tablet Last filled:10/04/20 78 DS  Star Rating Drugs: None noted   Care Gaps Zoster Vaccines- Shingrix:  Never done TETANUS/TDAP (Every 10 Years) Last completed: Apr 14, 2010 INFLUENZA VACCINE (Every 8 Months, August to March) Last completed: Oct 31, 2018   Corrie Mckusick, [place]

## 2020-10-21 ENCOUNTER — Ambulatory Visit (INDEPENDENT_AMBULATORY_CARE_PROVIDER_SITE_OTHER): Payer: Medicare Other | Admitting: Pharmacist

## 2020-10-21 DIAGNOSIS — Z9114 Patient's other noncompliance with medication regimen: Secondary | ICD-10-CM

## 2020-10-21 DIAGNOSIS — I5042 Chronic combined systolic (congestive) and diastolic (congestive) heart failure: Secondary | ICD-10-CM

## 2020-10-21 DIAGNOSIS — K2100 Gastro-esophageal reflux disease with esophagitis, without bleeding: Secondary | ICD-10-CM

## 2020-10-21 DIAGNOSIS — I10 Essential (primary) hypertension: Secondary | ICD-10-CM

## 2020-10-23 NOTE — Chronic Care Management (AMB) (Signed)
Chronic Care Management Pharmacy Note  10/24/2020 Name:  [name] MRN:  [id number] DOB:  [birth year]  Summary: Not taking metoprolol ER 36m 0.5 tablet = 257mdaily but is on med list -last filled at pharmacy 12/07/2019. Patient was unsure why she is not taking.  Recommendations/Changes made from today's visit: Message send to PCP regarding metoprolol - restarted metoprolol ER 2557maily.  Encouraged follow up with cardio. Patient is aware and states she will call to scheduled appt.   Subjective: [name] an [age]o. year old female who is a primary patient of Paz, JosAlda BertholdD.  The CCM team was consulted for assistance with disease management and care coordination needs.    Engaged with patient by telephone for initial visit in response to provider referral for pharmacy case management and/or care coordination services.   Consent to Services:  The patient was given the following information about Chronic Care Management services today, agreed to services, and gave verbal consent: 1. CCM service includes personalized support from designated clinical staff supervised by the primary care provider, including individualized plan of care and coordination with other care providers 2. 24/7 contact phone numbers for assistance for urgent and routine care needs. 3. Service will only be billed when office clinical staff spend 20 minutes or more in a month to coordinate care. 4. Only one practitioner may furnish and bill the service in a calendar month. 5.The patient may stop CCM services at any time (effective at the end of the month) by phone call to the office staff. 6. The patient will be responsible for cost sharing (co-pay) of up to 20% of the service fee (after annual deductible is met). Patient agreed to services and consent obtained.  Patient Care Team: PazColon BranchD as PCP - General SkaJerline PainD as PCP - Cardiology (Cardiology) MacBjorn LoserD as Consulting Physician  (Urology) PauMaia BreslowD as Consulting Physician (Orthopedic Surgery) KapAlden HippD as Consulting Physician (Obstetrics and Gynecology) DumPhylliss BobD as Consulting Physician (Orthopedic Surgery) AjaRed ChristiansD as Referring Physician (Pain Medicine) StaLadene ArtistD as Consulting Physician (Gastroenterology) EckCherre RobinsharmD (Pharmacist)  Recent office visits: 10/11/20-Amy Hopkins, LPN. Seen for Medicare Annual Wellness. [facility]. Follow up in 1 year. 08/21/20-Jose E. Ladona HornsD (PCP) General follow up. Labs ordered. Mammogram ordered. Ambulatory referral to Pulmonology. Follow up in 3 months.  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 We will check a blood test called D-dimer. If normal then we can stop the Eliquis CT of chest ordered. Follow up in clinic after CT. 08/28/20 (Gastroenterology) Amy S.Esterwood, PA-C. Seen for abdominal pain. Labs ordered. Start Zofran 4 mg every 6 hours as needed for nausea. Was advised to use Imodium up to 6/day as needed if diarrhea is persisting 06/24/20 (Gastroenterology) MalPricilla RiffletaFuller PlanD. Seen for Irritable bowel syndrome. 04/25/20-(Pain Medicine) SarShelby DubinA-C. Seen for back pain and leg pain. Referral to Neurosurgery for lumbar spinal stenosis and right leg radiculopathy. Follow up in 12 weeks  Hospital visits: None in previous 6 months  Objective:  Lab Results  Component Value Date   CREATININE 0.93 08/28/2020   CREATININE 0.90 08/21/2020   CREATININE 0.92 01/15/2020    Lab Results  Component Value Date   HGBA1C 5.6 04/15/2020   Last diabetic Eye exam: No results found for: HMDIABEYEEXA  Last diabetic Foot exam: No  results found for: HMDIABFOOTEX      Component Value Date/Time   CHOL 148 10/30/2015 1102   TRIG 68.0 10/30/2015 1102   HDL 60.50 10/30/2015 1102   CHOLHDL 2 10/30/2015 1102   VLDL 13.6 10/30/2015 1102    LDLCALC 74 10/30/2015 1102   LDLDIRECT 166.0 04/14/2010 1539    Hepatic Function Latest Ref Rng & Units 08/28/2020 12/07/2019 12/05/2019  Total Protein 6.0 - 8.3 g/dL 7.2 7.3 6.7  Albumin 3.5 - 5.2 g/dL 4.0 3.7 -  AST 0 - 37 U/L _0 ALT 0 - 35 U/L _1 Alk Phosphatase 39 - 117 U/L 82 67 -  Total Bilirubin 0.2 - 1.2 mg/dL 0.6 0.8 0.6  Bilirubin, Direct 0.0 - 0.3 mg/dL - - -    Lab Results  Component Value Date/Time   TSH 1.89 04/15/2020 02:40 PM   TSH 1.62 12/16/2017 02:16 PM    CBC Latest Ref Rng & Units 08/28/2020 08/21/2020 01/15/2020  WBC 4.0 - 10.5 K/uL 6.2 6.8 6.7  Hemoglobin 12.0 - 15.0 g/dL 13.3 12.8 13.0  Hematocrit 36.0 - 46.0 % 40.7 38.4 39.5  Platelets 150.0 - 400.0 K/uL 212.0 211.0 222.0    Lab Results  Component Value Date/Time   VD25OH 28.12 (L) 04/15/2020 02:40 PM   VD25OH 36 09/30/2010 03:48 PM   VD25OH 30 04/09/2008 08:06 PM    Clinical ASCVD: No  The ASCVD Risk score (Arnett DK, et al., 2019) failed to calculate for the following reasons:   Cannot find a previous HDL lab   Cannot find a previous total cholesterol lab    Other:   DEXA 08/15/2019 AP Spine L1-L3 02/15/2019 74.3 Normal 0.4 1.224 g/cm2 AP Spine L1-L3 10/04/2016 72.0 Normal 0.2 1.190 g/cm2   DualFemur Neck Left 02/15/2019 74.3 Osteopenia -1.8 0.787 g/cm2 DualFemur Neck Left 10/04/2016 72.0 Osteopenia -1.4 0.838 g/cm2   DualFemur Total Mean 02/15/2019 74.3 Osteopenia -1.2 0.860 g/cm2 DualFemur Total Mean 10/04/2016 72.0 Normal -0.8 0.910 g/cm2  FRAX* 10-year Probability of Fracture Based on femoral neck BMD: DualFemur (Left)   Major Osteoporotic Fracture: 10.9% Hip Fracture:                2.3% Population:                  [place] (Caucasian) Risk Factors:                None  Social History   Tobacco Use  Smoking Status Former   Packs/day: 0.50   Years: 10.00   Pack years: 5.00   Types: Cigarettes   Quit date: 02/09/1975   Years since quitting: 45.7  Smokeless  Tobacco Never  Tobacco Comments   started at age 28.   quit in the 1970s.   BP Readings from Last 3 Encounters:  09/05/20 134/82  08/28/20 138/80  08/21/20 126/80   Pulse Readings from Last 3 Encounters:  09/05/20 66  08/28/20 75  08/21/20 (!) 56   Wt Readings from Last 3 Encounters:  10/11/20 243 lb (110.2 kg)  09/05/20 250 lb 12.8 oz (113.8 kg)  08/28/20 245 lb 6 oz (111.3 kg)    Assessment: Review of patient past medical history, allergies, medications, health status, including review of consultants reports, laboratory and other test data, was performed as part of comprehensive evaluation and provision of chronic care management services.   SDOH:  (Social Determinants of Health) assessments and interventions performed:    CCM Care Plan  Allergies  Allergen Reactions   Dextromethorphan-Guaifenesin Nausea And Vomiting   Morphine Nausea And Vomiting   Gabapentin Other (See Comments)    Dizziness, made her feel loopy   Penicillins Rash    "> 30 years ago; best I can remember it was just a light rash on my arm" Did it involve swelling of the face/tongue/throat, SOB, or low BP? No Did it involve sudden or severe rash/hives, skin peeling, or any reaction on the inside of your mouth or nose? Unknown Did you need to seek medical attention at a hospital or doctor's office? No When did it last happen?      more than 30 years  If all above answers are "NO", may proceed with cephalosporin use.     Medications Reviewed Today     Reviewed by [name], PharmD (Pharmacist) on 10/21/20 at 1500  Med List Status: <None>   Medication Order Taking? Sig Documenting Provider Last Dose Status Informant  albuterol (VENTOLIN HFA) 108 (90 Base) MCG/ACT inhaler [id number] Yes Inhale 2 puffs into the lungs every 6 (six) hours as needed for wheezing (Chest congestion). Colon Branch, MD Taking Active Multiple Informants  Cholecalciferol (VITAMIN D) 50 MCG (2000 UT) CAPS [id number] Yes Take  4,000 Units by mouth daily. [provider] Taking Active Multiple Informants  dicyclomine (BENTYL) 20 MG tablet [id number] Yes Take 1 tablet (20 mg total) by mouth 4 (four) times daily -  before meals and at bedtime. Ladene Artist, MD Taking Active   ENTRESTO 97-103 MG [id number]  TAKE 1 TABLET TWICE A DAY Jerline Pain, MD  Active   HYDROcodone-acetaminophen [facility]) 10-325 MG tablet [id number] Yes Take 1 tablet by mouth every 6 (six) hours as needed. [provider] Taking Active   metoprolol succinate (TOPROL-XL) 50 MG 24 hr tablet [id number] No Take 0.5 tablets (25 mg total) by mouth daily.  Patient not taking: No sig reported   Colon Branch, MD Not Taking Active   NARCAN 4 MG/0.1ML LIQD nasal spray kit [id number] No Place 1 spray into the nose once as needed (opioid od).  Patient not taking: Reported on 10/21/2020   [provider] Not Taking Active            Med Note [facility], CARLOS A   Thu Jan 17, 2020  1:27 PM)    omeprazole (PRILOSEC) 40 MG capsule [id number] Yes Take 1 capsule (40 mg total) by mouth 2 (two) times daily. Ladene Artist, MD Taking Active   ondansetron [facility]) 4 MG tablet [id number] Yes Take 1 tablet by mouth 30 minutes ac meals and hs x 3 days then PRN Ladene Artist, MD Taking Active   pregabalin (LYRICA) 75 MG capsule [id number] Yes Take 75 mg by mouth 3 (three) times daily. [provider] Taking Active   rOPINIRole (REQUIP) 1 MG tablet [id number] Yes TAKE 1 TABLET BY MOUTH TWICE A DAY  Patient taking differently: Take 1 mg by mouth at bedtime. May take another tablet during night if she wakes up   Colon Branch, MD Taking Active   sertraline (ZOLOFT) 50 MG tablet [id number] Yes Take 1.5 tablets (75 mg total) by mouth daily. Colon Branch, MD Taking Active   traZODone (DESYREL) 50 MG tablet [id number] Yes Take 1-2 tablets (50-100 mg total) by mouth at bedtime. Colon Branch, MD Taking Active             Patient Active Problem  List   Diagnosis Date Noted  Pulmonary embolism ([place]) 12/07/2019   Cough 11/10/2019   Gastric polyps    Fibromyalgia, see office visit note from 02/14/2018 02/15/2018   Paresthesia 12/29/2017   Gait abnormality 09/21/2017   Dizziness 09/21/2017   Raynaud phenomenon 07/22/2017   Nonischemic cardiomyopathy ([place]) 05/18/2017   Chronic combined systolic and diastolic CHF (congestive heart failure) ([place]) 05/05/2017   Headache 05/05/2017   Chest pain 05/04/2017   Insomnia 10/23/2016   Morbid obesity ([place]) 12/16/2015   Myalgia 07/10/2015   Hyperlipidemia 07/10/2015   PCP NOTES >>>>>>>>>>>>>>>>> 10/31/2014   Anxiety and depression 09/10/2013   Vitamin B 12 deficiency 04/09/2013   Chronic cystitis 05/29/2012   Annual physical exam 06/14/2011   PARESTHESIA 04/01/2009   DJD (degenerative joint disease) 10/21/2008   BARRETTS ESOPHAGUS 07/19/2007   RESTLESS LEGS SYNDROME 05/11/2007   Migraine-- on topamax, rx by welness center 07/27/2006   Hypertension 07/27/2006   Osteopenia 07/27/2006   Chronic fatigue 07/27/2006    Immunization History  Administered Date(s) Administered   Fluad Quad(high Dose 65+) 10/31/2018   Influenza Split 02/17/2011   Influenza Whole 01/14/2010   Influenza, High Dose Seasonal PF 01/24/2015, 12/16/2017   Influenza,inj,Quad PF,6+ Mos 10/22/2013   Influenza-Unspecified 03/13/2016, 10/20/2016   Pneumococcal Conjugate-13 09/10/2013   Pneumococcal Polysaccharide-23 04/14/2010, 08/21/2020   Td 02/08/1998, 04/14/2010   Zoster, Live 12/06/2013    Conditions to be addressed/monitored: CHF, HTN, HLD, Depression, and history of PE - related to [place]; osteopenia; GERD with Barrett's esophagus; neuropathy; fibromylagia  Care Plan : General Pharmacy (Adult)  Updates made by [name], PHARMD since 10/24/2020 12:00 AM     Problem: Chronic Conditions: Heart failure, hypertension, Irritable bowel syndrome; Restless leg syndrome, depression, insomnia, osteopenia,  chronic pain, fibromyalgia, GERD with Barrett's esophagus   Priority: High  Onset Date: 10/21/2020     Goal: Medication and Chronic Care Management with improve patient knowledge about disease states, treatment and goals and improved adhernece   Start Date: 10/21/2020  Priority: High  Note:   Current Barriers:  Unable to independently monitor therapeutic efficacy Does not adhere to prescribed medication regimen  Pharmacist Clinical Goal(s):  Over the next 90 days, patient will achieve adherence to monitoring guidelines and medication adherence to achieve therapeutic efficacy maintain control of hypertension, acid reflux and mood as evidenced by maintaining goals listed below  adhere to prescribed medication regimen as evidenced by refill history  through collaboration with PharmD and provider.   Interventions: 1:1 collaboration with Colon Branch, MD regarding development and update of comprehensive plan of care as evidenced by provider attestation and co-signature Inter-disciplinary care team collaboration (see longitudinal plan of care) Comprehensive medication review performed; medication list updated in electronic medical record  Hypertension / Congestive Heart Failure: Controlled Blood pressure goal <140/90  CHF goals: prevent exacerbation of CHF and minimize symptoms of CHF Managed by cardiologist Trey Paula - last visit was 01/2020 HF with improved EF Noted to have systolic-diastolic heart failure / non ischemic cardiomyopathy EF was 35% initially; Most recent EF was 60-65% on 12/08/2019  BP Readings from Last 3 Encounters:  09/05/20 134/82  08/28/20 138/80  08/21/20 126/80  current treatment: Entresto 97/103 mg twice a day Metoprolol ER 1m - take 0.5 tablet = 238mdaily  Refill record show that metoprolol has not been filled since 12/07/2019. Patient is not sure why she is not taking. Unable to find reference to stop in EMR.  Current home blood pressure readings: no  currently checking blood pressure at home. Patient report  she has bought 2 blood pressure cuffs in past and both broke so she does not want to purchase another Denies hypotensive or hypertensive symptoms Interventions:   Educated on benefits of Entresto and metoprolol for heart health Counseled on monitor weight and heart rate Collaborated with PCP regarding restarting metoprolol - recommend Rx for metoprolol ER 42m daily - approved by Dr PLarose Kells Hyperlipidemia: Last LDL at goal of <100 but lipids last checked in 2017  Lab Results  Component Value Date   LDLCALC 74 10/30/2015   [place]158 (H) 09/18/2015   LDLCALC 101 (H) 07/14/2015  current treatment: No current pharmacotherapy   Medications previously tried: pravastatin, rosuvastatin (both noted to cause myalgias); ezetimibe tried in 2018 - caused joint pain  Interventions:  Recommended limit intake of saturated fat and cholesterol Consider rechecking lipid panel with next labs  Depression/sleep disturbance: Controlled;  current treatment: Sertraline 553m- take 1.5 tablets = 7592maily  Trazodone 28m56mtake 1 or 2 tablets at bedtime Past medications tried: escitalopram / Lexapro in 2015; Effexor Interventions:  None Continue current therapy  GERD with Barrett's Esophagus / abdominal cramps: Controlled Current therapy:  Omeprazole 40mg4mce a day Dicyclomine 20mg 30m 1 tablet 4 times a day before meal and at bedtime.  Patient reports rare reflux symptoms since starting omeprazole. Usually occurs when eating [place].  Grangerely has abdominal cramping.  Interventions:  Recommended continue current therapy.   Health Maintenance:  Discussed getting vaccines - Tdap, annual flu vaccine, COVID vaccine.  Patient plans to get Tdap and flu vaccine at next PCP appointment but she declines to get COVID vaccine.   Patient Goals/Self-Care Activities Over the next 90 days, patient will:  take medications as  prescribed Restart metoprolol ER at 25mg d63m  Follow Up Plan: Telephone follow up appointment with care management team member scheduled for:  1 month         Medication Assistance: None required.  Patient affirms current coverage meets needs.  Patient's preferred pharmacy is:  [id number] - [id number], [place] [address] [phone] Up:  Patient agrees to Care Plan and Follow-up.  Plan: Telephone follow up appointment with care management team member scheduled for:  1   Meher Kucinski EcCherre Robins Clinical Pharmacist [facility]

## 2020-10-24 NOTE — Patient Instructions (Signed)
Visit Information   PATIENT GOALS:   Goals Addressed             This Visit's Progress    Chronic Care Management - Pharmacy       rrent Barriers:  Unable to independently monitor therapeutic efficacy Does not adhere to prescribed medication regimen  Pharmacist Clinical Goal(s):  Over the next 90 days, patient will achieve adherence to monitoring guidelines and medication adherence to achieve therapeutic efficacy maintain control of hypertension, acid reflux and mood as evidenced by maintaining goals listed below  adhere to prescribed medication regimen as evidenced by refill history through collaboration with PharmD and provider.   Interventions: 1:1 collaboration with Colon Branch, MD regarding development and update of comprehensive plan of care as evidenced by provider attestation and co-signature Inter-disciplinary care team collaboration (see longitudinal plan of care) Comprehensive medication review performed; medication list updated in electronic medical record  Hypertension / Congestive Heart Failure: Blood pressure goal <140/90  CHF goals: prevent exacerbation of CHF and minimize symptoms of CHF  BP Readings from Last 3 Encounters:  09/05/20 134/82  08/28/20 138/80  08/21/20 126/80  current treatment: Entresto 97/103 mg twice a day Metoprolol ER 57m - take 0.5 tablet = 221mdaily  Interventions:   Educated on benefits of Entresto and metoprolol for heart health Counseled to monitor weight and heart rate Restart metoprolol ER  - we had sent in prescription for 2565maily so you do not need to half any more. Make appointment for follow up with Dr SkaMarlou Porch Hyperlipidemia: Last LDL at goal of <100 but lipids last checked in 2017  Lab Results  Component Value Date   LDLCALC 74 10/30/2015   LDL[place]8 (H) 09/18/2015   LDLCALC 101 (H) 07/14/2015  current treatment: No current pharmacotherapy   Interventions:  Recommended limit intake of saturated fat and  cholesterol Consider rechecking lipid panel with next labs  Depression/sleep disturbance: current treatment: Sertraline 55m22mtake 1.5 tablets = 75mg34mly  Trazodone 55mg 41mke 1 or 2 tablets at bedtime Interventions:  Continue current therapy  GERD with Barrett's Esophagus / abdominal cramps: Controlled Current therapy:  Omeprazole 40mg t88m a day Dicyclomine 20mg ta73m tablet 4 times a day before meal and at bedtime. .  Interventions:  Recommended continue current therapy.   Health Maintenance:  Discussed getting vaccines - Tdap, annual flu vaccine, COVID vaccine.  Patient plans to get Tdap and flu vaccine at next PCP appointment but she declines to get COVID vaccine.   Patient Goals/Self-Care Activities Over the next 90 days, patient will:  take medications as prescribed Restart metoprolol ER at 25mg dai12mFollow Up Plan: Telephone follow up appointment with care management team member scheduled for:  1 month           Consent to CCM Services: Ms. Wade waLagrandn information about Chronic Care Management services including:  CCM service includes personalized support from designated clinical staff supervised by her physician, including individualized plan of care and coordination with other care providers 24/7 contact phone numbers for assistance for urgent and routine care needs. Service will only be billed when office clinical staff spend 20 minutes or more in a month to coordinate care. Only one practitioner may furnish and bill the service in a calendar month. The patient may stop CCM services at any time (effective at the end of the month) by phone call to the office staff. The patient will be responsible for cost  sharing (co-pay) of up to 20% of the service fee (after annual deductible is met).  Patient agreed to services and verbal consent obtained.   Patient verbalizes understanding of instructions provided today and agrees to view in [place].    Telephone follow up appointment with care management team member scheduled for: 1 to 2 months  [name], PharmD Clinical Pharmacist [facility]   CLINICAL CARE PLAN: Patient Care Plan: General Pharmacy (Adult)     Problem Identified: Chronic Conditions: Heart failure, hypertension, Irritable bowel syndrome; Restless leg syndrome, depression, insomnia, osteopenia, chronic pain, fibromyalgia, GERD with Barrett's esophagus   Priority: High  Onset Date: 10/21/2020     Goal: Medication and Chronic Care Management with improve patient knowledge about disease states, treatment and goals and improved adhernece   Start Date: 10/21/2020  Priority: High  Note:   Current Barriers:  Unable to independently monitor therapeutic efficacy Does not adhere to prescribed medication regimen  Pharmacist Clinical Goal(s):  Over the next 90 days, patient will achieve adherence to monitoring guidelines and medication adherence to achieve therapeutic efficacy maintain control of hypertension, acid reflux and mood as evidenced by maintaining goals listed below  adhere to prescribed medication regimen as evidenced by refill history  through collaboration with PharmD and provider.   Interventions: 1:1 collaboration with Colon Branch, MD regarding development and update of comprehensive plan of care as evidenced by provider attestation and co-signature Inter-disciplinary care team collaboration (see longitudinal plan of care) Comprehensive medication review performed; medication list updated in electronic medical record  Hypertension / Congestive Heart Failure: Controlled Blood pressure goal <140/90  CHF goals: prevent exacerbation of CHF and minimize symptoms of CHF Managed by cardiologist Trey Paula - last visit was 01/2020 HF with improved EF Noted to have systolic-diastolic heart failure / non ischemic cardiomyopathy EF was 35% initially; Most recent EF was 60-65%  on 12/08/2019  BP Readings from Last 3 Encounters:  09/05/20 134/82  08/28/20 138/80  08/21/20 126/80  current treatment: Entresto 97/103 mg twice a day Metoprolol ER 44m - take 0.5 tablet = 236mdaily  Refill record show that metoprolol has not been filled since 12/07/2019. Patient is not sure why she is not taking. Unable to find reference to stop in EMR.  Current home blood pressure readings: no currently checking blood pressure at home. Patient report she has bought 2 blood pressure cuffs in past and both broke so she does not want to purchase another Denies hypotensive or hypertensive symptoms Interventions:   Educated on benefits of Entresto and metoprolol for heart health Counseled on monitor weight and heart rate Collaborated with PCP regarding restarting metoprolol - recommend Rx for metoprolol ER 2539maily - approved by Dr PazLarose Kellsyperlipidemia: Last LDL at goal of <100 but lipids last checked in 2017  Lab Results  Component Value Date   LDLCALC 74 10/30/2015   LD[place]awn[place]8 (H) 09/18/2015   LDLCALC 101 (H) 07/14/2015  current treatment: No current pharmacotherapy   Medications previously tried: pravastatin, rosuvastatin (both noted to cause myalgias); ezetimibe tried in 2018 - caused joint pain  Interventions:  Recommended limit intake of saturated fat and cholesterol Consider rechecking lipid panel with next labs  Depression/sleep disturbance: Controlled;  current treatment: Sertraline 43m31mtake 1.5 tablets = 75mg32mly  Trazodone 43mg 22mke 1 or 2 tablets at bedtime Past medications tried: escitalopram / Lexapro in 2015; Effexor Interventions:  None Continue current therapy  GERD with Barrett's  Esophagus / abdominal cramps: Controlled Current therapy:  Omeprazole 75m twice a day Dicyclomine 229mtake 1 tablet 4 times a day before meal and at bedtime.  Patient reports rare reflux symptoms since starting omeprazole. Usually occurs when eating [place] Rarely has abdominal cramping.  Interventions:  Recommended continue current therapy.   Health Maintenance:  Discussed getting vaccines - Tdap, annual flu vaccine, COVID vaccine.  Patient plans to get Tdap and flu vaccine at next PCP appointment but she declines to get COVID vaccine.   Patient Goals/Self-Care Activities Over the next 90 days, patient will:  take medications as prescribed Restart metoprolol ER at 2545maily  Follow Up Plan: Telephone follow up appointment with care management team member scheduled for:  1 month

## 2020-11-03 DIAGNOSIS — H5213 Myopia, bilateral: Secondary | ICD-10-CM | POA: Diagnosis not present

## 2020-11-03 DIAGNOSIS — H353130 Nonexudative age-related macular degeneration, bilateral, stage unspecified: Secondary | ICD-10-CM | POA: Diagnosis not present

## 2020-11-03 DIAGNOSIS — H52223 Regular astigmatism, bilateral: Secondary | ICD-10-CM | POA: Diagnosis not present

## 2020-11-03 DIAGNOSIS — H524 Presbyopia: Secondary | ICD-10-CM | POA: Diagnosis not present

## 2020-11-03 DIAGNOSIS — H40023 Open angle with borderline findings, high risk, bilateral: Secondary | ICD-10-CM | POA: Diagnosis not present

## 2020-11-03 DIAGNOSIS — Z961 Presence of intraocular lens: Secondary | ICD-10-CM | POA: Diagnosis not present

## 2020-11-07 DIAGNOSIS — I10 Essential (primary) hypertension: Secondary | ICD-10-CM | POA: Diagnosis not present

## 2020-11-07 DIAGNOSIS — I5042 Chronic combined systolic (congestive) and diastolic (congestive) heart failure: Secondary | ICD-10-CM

## 2020-11-10 ENCOUNTER — Other Ambulatory Visit: Payer: Self-pay | Admitting: Internal Medicine

## 2020-11-13 ENCOUNTER — Other Ambulatory Visit: Payer: Self-pay | Admitting: Pulmonary Disease

## 2020-11-13 DIAGNOSIS — U099 Post covid-19 condition, unspecified: Secondary | ICD-10-CM

## 2020-11-13 DIAGNOSIS — R911 Solitary pulmonary nodule: Secondary | ICD-10-CM

## 2020-11-14 ENCOUNTER — Ambulatory Visit
Admission: RE | Admit: 2020-11-14 | Discharge: 2020-11-14 | Disposition: A | Discharge status: Home / Self Care | Payer: Medicare Other | Source: Ambulatory Visit | Attending: Pulmonary Disease | Admitting: Pulmonary Disease

## 2020-11-14 DIAGNOSIS — U099 Post covid-19 condition, unspecified: Secondary | ICD-10-CM

## 2020-11-14 DIAGNOSIS — R911 Solitary pulmonary nodule: Secondary | ICD-10-CM

## 2020-11-14 DIAGNOSIS — I700 Atherosclerosis of aorta: Secondary | ICD-10-CM | POA: Diagnosis not present

## 2020-11-20 ENCOUNTER — Telehealth (INDEPENDENT_AMBULATORY_CARE_PROVIDER_SITE_OTHER): Payer: Medicare Other | Admitting: Internal Medicine

## 2020-11-20 ENCOUNTER — Encounter: Payer: Self-pay | Admitting: Internal Medicine

## 2020-11-20 ENCOUNTER — Other Ambulatory Visit: Payer: Self-pay

## 2020-11-20 VITALS — Ht 66.0 in | Wt 240.0 lb

## 2020-11-20 DIAGNOSIS — F419 Anxiety disorder, unspecified: Secondary | ICD-10-CM | POA: Diagnosis not present

## 2020-11-20 DIAGNOSIS — R5382 Chronic fatigue, unspecified: Secondary | ICD-10-CM

## 2020-11-20 DIAGNOSIS — F32A Depression, unspecified: Secondary | ICD-10-CM | POA: Diagnosis not present

## 2020-11-20 DIAGNOSIS — M797 Fibromyalgia: Secondary | ICD-10-CM | POA: Diagnosis not present

## 2020-11-20 NOTE — Progress Notes (Signed)
Subjective:    Patient ID: [name], female    DOB: 03/31/1945, 75 y.o.   MRN: [id number]  DOS:  11/20/2020 Type of visit - description: Virtual Visit via Video Note  I connected with the above patient  by a video enabled telemedicine application and verified that I am speaking with the correct person using two identifiers.   THIS ENCOUNTER IS A VIRTUAL VISIT DUE TO COVID-19 - PATIENT WAS NOT SEEN IN THE OFFICE. PATIENT HAS CONSENTED TO VIRTUAL VISIT / TELEMEDICINE VISIT   Location of patient: home  Location of provider: office  Persons participating in the virtual visit: patient, provider   I discussed the limitations of evaluation and management by telemedicine and the availability of in person appointments. The patient expressed understanding and agreed to proceed.  Acute Patient called because she continue with: Fatigue Nausea but no vomiting On and off diarrhea. Symptoms are no new they are just continue and she wonders what she can do.  Denies fever chills Mood described as "okay". No shortness of breath or cough No dysphagia or odynophagia No major change in her weight   Wt Readings from Last 3 Encounters:  11/20/20 240 lb (108.9 kg)  10/11/20 243 lb (110.2 kg)  09/05/20 250 lb 12.8 oz (113.8 kg)   Review of Systems See above   Past Medical History:  Diagnosis Date   Allergy    Anemia    Anxiety    Barrett's esophagus    Bilateral carpal tunnel syndrome    Dr. Eddie Dibbles, having injection therapy   C. difficile diarrhea 08/01/2012   severe 2014   Carotid arterial disease ([place])    Cataract    removed both eyes    Chronic combined systolic and diastolic CHF (congestive heart failure) ([place])    Chronic cystitis    Dr. Matilde Sprang   Chronic lower back pain    Chronic pain syndrome    Depression    Dizziness    DJD (degenerative joint disease)    bilateral hands; knees   Family history of anesthesia complication    Mother had severe N/V   Fibromyalgia     GERD (gastroesophageal reflux disease)    H/O cardiac catheterization    (-) cath 12-2002  , cath again 2011 (-)   History of colon polyps    HTN (hypertension)    Hyperlipidemia    past hx    IBS (irritable bowel syndrome)    Insomnia    Migraine    Morbid obesity ([place])    Non-ischemic cardiomyopathy ([place])    Osteoporosis    pt unsure of this   RLS (restless legs syndrome)    Vertigo    Vitamin B 12 deficiency 04/09/2013    Past Surgical History:  Procedure Laterality Date   Arm surgery Left    "don't remember what they did; arm wasn't broken"   BILATERAL KNEE ARTHROSCOPY Bilateral    BIOPSY  08/07/2018   Procedure: BIOPSY;  Surgeon: Ladene Artist, MD;  Location: [facility];  Service: Endoscopy;;   CARDIAC CATHETERIZATION  01/22/10   clean cath   CATARACT EXTRACTION W/ INTRAOCULAR LENS  IMPLANT, BILATERAL Bilateral    CHOLECYSTECTOMY N/A 10/25/2016   Procedure: LAPAROSCOPIC CHOLECYSTECTOMY;  Surgeon: Georganna Skeans, MD;  Location: [facility];  Service: General;  Laterality: N/A;   COLONOSCOPY     COLONOSCOPY W/ POLYPECTOMY     ESOPHAGOGASTRODUODENOSCOPY (EGD) WITH PROPOFOL N/A 08/07/2018   Procedure: ESOPHAGOGASTRODUODENOSCOPY (EGD) WITH PROPOFOL;  Surgeon:  Ladene Artist, MD;  Location: [facility];  Service: Endoscopy;  Laterality: N/A;   FOOT SURGERY Bilateral    toenails removed; callus removed on right; hammertoes right"   Knot     "removed from right neck; not a goiter"   LUMBAR DISC SURGERY     L5 S1 anterior fusion   OOPHORECTOMY     RIGHT/LEFT HEART CATH AND CORONARY ANGIOGRAPHY N/A 05/06/2017   Procedure: RIGHT/LEFT HEART CATH AND CORONARY ANGIOGRAPHY;  Surgeon: Nelva Bush, MD;  Location: [facility];  Service: Cardiovascular;  Laterality: N/A;   SHOULDER ARTHROSCOPY Right    TOTAL KNEE ARTHROPLASTY  10/05/2011   Procedure: TOTAL KNEE ARTHROPLASTY;  Surgeon: Hessie Dibble, MD;  Location: [facility];  Service: Orthopedics;  Laterality: Right;    UPPER GASTROINTESTINAL ENDOSCOPY     VAGINAL HYSTERECTOMY     for endometriosis    Allergies as of 11/20/2020       Reactions   Dextromethorphan-guaifenesin Nausea And Vomiting   Morphine Nausea And Vomiting   Gabapentin Other (See Comments)   Dizziness, made her feel loopy   Penicillins Rash   "> 30 years ago; best I can remember it was just a light rash on my arm" Did it involve swelling of the face/tongue/throat, SOB, or low BP? No Did it involve sudden or severe rash/hives, skin peeling, or any reaction on the inside of your mouth or nose? Unknown Did you need to seek medical attention at a hospital or doctor's office? No When did it last happen?      more than 30 years  If all above answers are "NO", may proceed with cephalosporin use.        Medication List        Accurate as of November 20, 2020 10:03 AM. If you have any questions, ask your nurse or doctor.          albuterol 108 (90 Base) MCG/ACT inhaler Commonly known as: VENTOLIN HFA Inhale 2 puffs into the lungs every 6 (six) hours as needed for wheezing (Chest congestion).   dicyclomine 20 MG tablet Commonly known as: BENTYL Take 1 tablet (20 mg total) by mouth 4 (four) times daily -  before meals and at bedtime.   Entresto 97-103 MG Generic drug: sacubitril-valsartan TAKE 1 TABLET TWICE A DAY   HYDROcodone-acetaminophen 10-325 MG tablet Commonly known as: NORCO Take 1 tablet by mouth every 6 (six) hours as needed.   metoprolol succinate 25 MG 24 hr tablet Commonly known as: TOPROL-XL Take 25 mg by mouth daily.   Narcan 4 MG/0.1ML Liqd nasal spray kit Generic drug: naloxone Place 1 spray into the nose once as needed (opioid od).   omeprazole 40 MG capsule Commonly known as: PRILOSEC Take 1 capsule (40 mg total) by mouth 2 (two) times daily.   ondansetron 4 MG tablet Commonly known as: ZOFRAN Take 1 tablet by mouth 30 minutes ac meals and hs x 3 days then PRN   pregabalin 75 MG  capsule Commonly known as: LYRICA Take 75 mg by mouth 3 (three) times daily.   rOPINIRole 1 MG tablet Commonly known as: REQUIP TAKE 1 TABLET BY MOUTH TWICE A DAY What changed:  when to take this additional instructions   sertraline 50 MG tablet Commonly known as: ZOLOFT Take 1.5 tablets (75 mg total) by mouth daily.   traZODone 50 MG tablet Commonly known as: DESYREL Take 1-2 tablets (50-100 mg total) by mouth at bedtime.   Vitamin D  50 MCG (2000 UT) Caps Take 4,000 Units by mouth daily.           Objective:   Physical Exam Ht '5\' 6"'  (1.676 m)   Wt 240 lb (108.9 kg)   BMI 38.74 kg/m  This is a virtual video visit, alert oriented x3, walking while talking to me     Assessment        Assessment HTN Hyperlipidemia-  Pravachol, Crestor: Myalgias. Intolerant to Zetia (joint aches), see OV  06/2016  Depression, Anxiety (on clonazepam), insomnia (on trazodone):  depression x many  years, remotely on zoloft and other meds per psych, I rx lexapro 2015, then was rx effexor x a while Morbid obesity CV: CHF/ non ischemic cardiomyopathy dx 04-2017; cath normal coronaries, EF ~ 30% Carotid artery disease: 1 to 39% bilateral carotids 09-2016, start aspirin per neurology GI:  --GERD, Barrett's esophagus, had a EGD 07/2018  --h/o persistent C. difficile diarrhea 2014 B12 deficiency Osteopenia: Dexa 2015 showed osteopenia, T score -1.9 (10/2016): Rx cs, vit D PULM: --NPSG 2008:  No osa, PLMS 4/hr with arousal and awakening --RLS - Requip  Migraines, f/u [facility] -- off topamax as off 06-2016 MSK: --Back pain, chronic: pain meds rx by ortho, Workers comp MD @ [employer] pain mngmt [place] --DJD,CTS B ([facility]) --Fibromyalgia.  See note from 02/14/2018 OAB, LUTS-- on myrbetriq Dr McDarmoth  Raynaud phenomena---- DX 07/2017 COVID-19, pulmonary emboli, monoclonal infusion 11/13/2019  PLAN Fatigue, COVID-19, pulmonary emboli 11-2019: CC today is fatigue, on chart review this  is a chronic issue (see OV note from 02/14/2018) in the context of multiple other problems including fibromyalgia. Chronic fatigue increased since she had COVID 11-2019.   Saw pulmonary 09/05/2020, they recommended a high-resolution CT, it showed no worrisome pulmonary nodules, coronary calcifications Anticoagulants for PE 11-2019  were d/c. They also ordered a sleep study to be done tomorrow. On chart review, most recent labs were okay, nothing to account for fatigue.  Did have low vitamin D on supplements. Depression anxiety: Or sertraline, states her mood is okay and declines to change medication or increase the dose IBS: Ongoing abdominal pain, nausea, diarrhea on and off.  No new SX or  red flag SX;  needs to be assessed in person, recommend to come in 1- 2 weeks.  Sooner if symptoms severe History of fibromyalgia: I believe FM, IBS is playing a large role on her symptoms. RTC 1 or 2 weeks  Today, I spent 30 minutes with the patient. Listening therapy provided. Chart reviewed to be sure we are not dealing with an acute/worsening problem.    I discussed the assessment and treatment plan with the patient. The patient was provided an opportunity to ask questions and all were answered. The patient agreed with the plan and demonstrated an understanding of the instructions.   The patient was advised to call back or seek an in-person evaluation if the symptoms worsen or if the condition fails to improve as anticipated.

## 2020-11-21 ENCOUNTER — Ambulatory Visit: Payer: Medicare Other

## 2020-11-21 ENCOUNTER — Other Ambulatory Visit: Payer: Self-pay

## 2020-11-21 DIAGNOSIS — G4733 Obstructive sleep apnea (adult) (pediatric): Secondary | ICD-10-CM | POA: Diagnosis not present

## 2020-11-22 NOTE — Assessment & Plan Note (Signed)
Fatigue, COVID-19, pulmonary emboli 11-2019: CC today is fatigue, on chart review this is a chronic issue (see OV note from 02/14/2018) in the context of multiple other problems including fibromyalgia. Chronic fatigue increased since she had COVID 11-2019.   Saw pulmonary 09/05/2020, they recommended a high-resolution CT, it showed no worrisome pulmonary nodules, coronary calcifications Anticoagulants for PE 11-2019  were d/c. They also ordered a sleep study to be done tomorrow. On chart review, most recent labs were okay, nothing to account for fatigue.  Did have low vitamin D on supplements. Depression anxiety: Or sertraline, states her mood is okay and declines to change medication or increase the dose IBS: Ongoing abdominal pain, nausea, diarrhea on and off.  No new SX or  red flag SX;  needs to be assessed in person, recommend to come in 1- 2 weeks.  Sooner if symptoms severe History of fibromyalgia: I believe FM, IBS is playing a large role on her symptoms. RTC 1 or 2 weeks

## 2020-11-27 ENCOUNTER — Telehealth: Payer: Self-pay

## 2020-11-27 DIAGNOSIS — G4733 Obstructive sleep apnea (adult) (pediatric): Secondary | ICD-10-CM | POA: Diagnosis not present

## 2020-11-27 NOTE — Chronic Care Management (AMB) (Signed)
Chronic Care Management Pharmacy Assistant   Name: FABIANA DROMGOOLE  MRN: [id number] DOB: [birth year]  Reason for Encounter: Disease State General Assessment     Recent office visits:  11/20/20 Colon Branch MD - Seen for fatigue - No medication changes noted - No follow up noted   Recent consult visits:  None noted   Hospital visits:  None in previous 6 months  Medications: Outpatient Encounter Medications as of 11/27/2020  Medication Sig Note   albuterol (VENTOLIN HFA) 108 (90 Base) MCG/ACT inhaler Inhale 2 puffs into the lungs every 6 (six) hours as needed for wheezing (Chest congestion).    Cholecalciferol (VITAMIN D) 50 MCG (2000 UT) CAPS Take 4,000 Units by mouth daily.    dicyclomine (BENTYL) 20 MG tablet Take 1 tablet (20 mg total) by mouth 4 (four) times daily -  before meals and at bedtime. 11/20/2020: Forgets often   ENTRESTO 97-103 MG TAKE 1 TABLET TWICE A DAY    HYDROcodone-acetaminophen (NORCO) 10-325 MG tablet Take 1 tablet by mouth every 6 (six) hours as needed.    metoprolol succinate (TOPROL-XL) 25 MG 24 hr tablet Take 25 mg by mouth daily.    NARCAN 4 MG/0.1ML LIQD nasal spray kit Place 1 spray into the nose once as needed (opioid od). (Patient not taking: No sig reported)    omeprazole (PRILOSEC) 40 MG capsule Take 1 capsule (40 mg total) by mouth 2 (two) times daily.    ondansetron (ZOFRAN) 4 MG tablet Take 1 tablet by mouth 30 minutes ac meals and hs x 3 days then PRN    pregabalin (LYRICA) 75 MG capsule Take 75 mg by mouth 3 (three) times daily.    rOPINIRole (REQUIP) 1 MG tablet TAKE 1 TABLET BY MOUTH TWICE A DAY (Patient taking differently: Take 1 mg by mouth at bedtime. May take another tablet during night if she wakes up)    sertraline (ZOLOFT) 50 MG tablet Take 1.5 tablets (75 mg total) by mouth daily.    traZODone (DESYREL) 50 MG tablet Take 1-2 tablets (50-100 mg total) by mouth at bedtime.    No facility-administered encounter medications on file as of  11/27/2020.   Have you had any problems recently with your health? Patient states that she is not doing so well. She has severe fatigue and states that she usually lays down for most of the day. She also states that she gets sick to her stomach whenever she eats, which she states has been happening for a couple of months. She does have some medicine to help with the stomach sickness and states that she is running low but will ask for a refill from her PCP at her appointment tomorrow. She does not have and appetite but tries to eat, this morning she tried to eat half of a bagel. When asked about her water intake she states that she drinks pepsi with zero sugar or sometimes try to drink juice. I asked if she is currently taking her metoprolol 25 mg for her hypertension, she states that she thinks she's taking it.   Have you had any problems with your pharmacy? Patient states no issues with pharmacy.   What issues or side effects are you having with your medications? Patient states no issues with side effects from medications   What would you like me to pass along to Tammy Eckard,CPP for them to help you with?  Patient states that she would like to feel better with her fatigue but  otherwise there is nothing at this time.   What can we do to take care of you better?  N/a   Star Rating Drugs: None noted   Andee Poles, CMA

## 2020-11-28 ENCOUNTER — Other Ambulatory Visit: Payer: Self-pay

## 2020-11-28 ENCOUNTER — Ambulatory Visit (INDEPENDENT_AMBULATORY_CARE_PROVIDER_SITE_OTHER): Payer: Medicare Other | Admitting: Internal Medicine

## 2020-11-28 ENCOUNTER — Encounter: Payer: Self-pay | Admitting: Internal Medicine

## 2020-11-28 VITALS — BP 126/90 | HR 65 | Temp 98.6°F | Resp 18 | Ht 66.0 in | Wt 242.4 lb

## 2020-11-28 DIAGNOSIS — R5382 Chronic fatigue, unspecified: Secondary | ICD-10-CM

## 2020-11-28 DIAGNOSIS — G4700 Insomnia, unspecified: Secondary | ICD-10-CM

## 2020-11-28 DIAGNOSIS — F32A Depression, unspecified: Secondary | ICD-10-CM | POA: Diagnosis not present

## 2020-11-28 DIAGNOSIS — R110 Nausea: Secondary | ICD-10-CM

## 2020-11-28 DIAGNOSIS — M797 Fibromyalgia: Secondary | ICD-10-CM

## 2020-11-28 DIAGNOSIS — F419 Anxiety disorder, unspecified: Secondary | ICD-10-CM | POA: Diagnosis not present

## 2020-11-28 DIAGNOSIS — E559 Vitamin D deficiency, unspecified: Secondary | ICD-10-CM | POA: Diagnosis not present

## 2020-11-28 DIAGNOSIS — I10 Essential (primary) hypertension: Secondary | ICD-10-CM

## 2020-11-28 DIAGNOSIS — R112 Nausea with vomiting, unspecified: Secondary | ICD-10-CM

## 2020-11-28 DIAGNOSIS — R1010 Upper abdominal pain, unspecified: Secondary | ICD-10-CM | POA: Diagnosis not present

## 2020-11-28 DIAGNOSIS — E538 Deficiency of other specified B group vitamins: Secondary | ICD-10-CM | POA: Diagnosis not present

## 2020-11-28 NOTE — Progress Notes (Signed)
Subjective:    Patient ID: [name], female    DOB: 26-Feb-1945, 75 y.o.   MRN: [id number]  DOS:  11/28/2020 Type of visit - description: Follow-up  Patient was seen remotely on 11/20/2020 for worsening fatigue since she had COVID and pulmonary emboli 11-2019, was recommended to come back in person. Fatigue is unchanged. Sleep study done, report reviewed GI symptoms: "Today is a first time I don't have nausea".  Review of Systems See above   Past Medical History:  Diagnosis Date   Allergy    Anemia    Anxiety    Barrett's esophagus    Bilateral carpal tunnel syndrome    Dr. Eddie Dibbles, having injection therapy   C. difficile diarrhea 08/01/2012   severe 2014   Carotid arterial disease ([place])    Cataract    removed both eyes    Chronic combined systolic and diastolic CHF (congestive heart failure) ([place])    Chronic cystitis    Dr. Matilde Sprang   Chronic lower back pain    Chronic pain syndrome    Depression    Dizziness    DJD (degenerative joint disease)    bilateral hands; knees   Family history of anesthesia complication    Mother had severe N/V   Fibromyalgia    GERD (gastroesophageal reflux disease)    H/O cardiac catheterization    (-) cath 12-2002  , cath again 2011 (-)   History of colon polyps    HTN (hypertension)    Hyperlipidemia    past hx    IBS (irritable bowel syndrome)    Insomnia    Migraine    Morbid obesity ([place])    Non-ischemic cardiomyopathy ([place])    Osteoporosis    pt unsure of this   RLS (restless legs syndrome)    Vertigo    Vitamin B 12 deficiency 04/09/2013    Past Surgical History:  Procedure Laterality Date   Arm surgery Left    "don't remember what they did; arm wasn't broken"   BILATERAL KNEE ARTHROSCOPY Bilateral    BIOPSY  08/07/2018   Procedure: BIOPSY;  Surgeon: Ladene Artist, MD;  Location: [facility];  Service: Endoscopy;;   CARDIAC CATHETERIZATION  01/22/10   clean cath   CATARACT EXTRACTION W/ INTRAOCULAR LENS   IMPLANT, BILATERAL Bilateral    CHOLECYSTECTOMY N/A 10/25/2016   Procedure: LAPAROSCOPIC CHOLECYSTECTOMY;  Surgeon: Georganna Skeans, MD;  Location: [facility];  Service: General;  Laterality: N/A;   COLONOSCOPY     COLONOSCOPY W/ POLYPECTOMY     ESOPHAGOGASTRODUODENOSCOPY (EGD) WITH PROPOFOL N/A 08/07/2018   Procedure: ESOPHAGOGASTRODUODENOSCOPY (EGD) WITH PROPOFOL;  Surgeon: Ladene Artist, MD;  Location: [facility];  Service: Endoscopy;  Laterality: N/A;   FOOT SURGERY Bilateral    toenails removed; callus removed on right; hammertoes right"   Knot     "removed from right neck; not a goiter"   LUMBAR DISC SURGERY     L5 S1 anterior fusion   OOPHORECTOMY     RIGHT/LEFT HEART CATH AND CORONARY ANGIOGRAPHY N/A 05/06/2017   Procedure: RIGHT/LEFT HEART CATH AND CORONARY ANGIOGRAPHY;  Surgeon: Nelva Bush, MD;  Location: [facility];  Service: Cardiovascular;  Laterality: N/A;   SHOULDER ARTHROSCOPY Right    TOTAL KNEE ARTHROPLASTY  10/05/2011   Procedure: TOTAL KNEE ARTHROPLASTY;  Surgeon: Hessie Dibble, MD;  Location: [facility];  Service: Orthopedics;  Laterality: Right;   UPPER GASTROINTESTINAL ENDOSCOPY     VAGINAL HYSTERECTOMY  for endometriosis    Allergies as of 11/28/2020       Reactions   Dextromethorphan-guaifenesin Nausea And Vomiting   Morphine Nausea And Vomiting   Gabapentin Other (See Comments)   Dizziness, made her feel loopy   Penicillins Rash   "> 30 years ago; best I can remember it was just a light rash on my arm" Did it involve swelling of the face/tongue/throat, SOB, or low BP? No Did it involve sudden or severe rash/hives, skin peeling, or any reaction on the inside of your mouth or nose? Unknown Did you need to seek medical attention at a hospital or doctor's office? No When did it last happen?      more than 30 years  If all above answers are "NO", may proceed with cephalosporin use.        Medication List        Accurate as of November 28, 2020 11:59 PM. If you have any questions, ask your nurse or doctor.          albuterol 108 (90 Base) MCG/ACT inhaler Commonly known as: VENTOLIN HFA Inhale 2 puffs into the lungs every 6 (six) hours as needed for wheezing (Chest congestion).   dicyclomine 20 MG tablet Commonly known as: BENTYL Take 1 tablet (20 mg total) by mouth 4 (four) times daily -  before meals and at bedtime.   Entresto 97-103 MG Generic drug: sacubitril-valsartan TAKE 1 TABLET TWICE A DAY   HYDROcodone-acetaminophen 10-325 MG tablet Commonly known as: NORCO Take 1 tablet by mouth every 6 (six) hours as needed.   metoprolol succinate 25 MG 24 hr tablet Commonly known as: TOPROL-XL Take 25 mg by mouth daily.   Narcan 4 MG/0.1ML Liqd nasal spray kit Generic drug: naloxone Place 1 spray into the nose once as needed (opioid od).   omeprazole 40 MG capsule Commonly known as: PRILOSEC Take 1 capsule (40 mg total) by mouth 2 (two) times daily.   ondansetron 4 MG tablet Commonly known as: ZOFRAN Take 1 tablet by mouth 30 minutes ac meals and hs x 3 days then PRN   pregabalin 75 MG capsule Commonly known as: LYRICA Take 75 mg by mouth 3 (three) times daily.   rOPINIRole 1 MG tablet Commonly known as: REQUIP TAKE 1 TABLET BY MOUTH TWICE A DAY What changed:  when to take this additional instructions   sertraline 50 MG tablet Commonly known as: ZOLOFT Take 1.5 tablets (75 mg total) by mouth daily.   traZODone 50 MG tablet Commonly known as: DESYREL Take 1-2 tablets (50-100 mg total) by mouth at bedtime.   Vitamin D 50 MCG (2000 UT) Caps Take 4,000 Units by mouth daily.           Objective:   Physical Exam BP 126/90 (BP Location: Left Arm, Patient Position: Sitting, Cuff Size: Normal)   Pulse 65   Temp 98.6 F (37 C) (Oral)   Resp 18   Ht _0  (1.676 m)   Wt 242 lb 6 oz (109.9 kg)   SpO2 98%   BMI 39.12 kg/m  General:   Well developed, NAD, BMI noted.  HEENT:  Normocephalic .  Face symmetric, atraumatic Lungs:  CTA B Normal respiratory effort, no intercostal retractions, no accessory muscle use. Heart: RRR,  no murmur.  Abdomen:  Not distended, soft, she is tender without mass or rebound at the epigastric area.    Skin: Not pale. Not jaundice Lower extremities: no pretibial edema bilaterally  Neurologic:  alert & oriented X3.  Speech normal, gait appropriate for age and unassisted Psych--  Cognition and judgment appear intact.  Cooperative with normal attention span and concentration.  Behavior appropriate. No anxious or depressed appearing.     Assessment     Assessment HTN Hyperlipidemia-  Pravachol, Crestor: Myalgias. Intolerant to Zetia (joint aches), see OV  06/2016  Depression, Anxiety (on clonazepam), insomnia (on trazodone):  depression x many  years, remotely on zoloft and other meds per psych, I rx lexapro 2015, then was rx effexor x a while Morbid obesity CV: CHF/ non ischemic cardiomyopathy dx 04-2017; cath normal coronaries, EF ~ 30% Carotid artery disease: 1 to 39% bilateral carotids 09-2016, start aspirin per neurology GI:  --GERD, Barrett's esophagus, had a EGD 07/2018  --h/o persistent C. difficile diarrhea 2014 --Cholecystectomy B12 deficiency Osteopenia: Dexa 2015 showed osteopenia, T score -1.9 (10/2016): Rx cs, vit D PULM: --NPSG 2008:  No osa, PLMS 4/hr with arousal and awakening --RLS - Requip  Migraines, f/u [facility] -- off topamax as off 06-2016 MSK: --Back pain, chronic: pain meds rx by ortho, Workers comp MD @ [employer] pain mngmt [place] --DJD,CTS B ([facility]) --Fibromyalgia.  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  Took anticoagulants until 08-2020.  PLAN Fatigue: See previous visit, chronic fatigue worse since she had COVID and a P.E.  11-2019. Plan: Check labs today, suspect a large part of this  issue is d/t FM.  Other factors are mild OSA and multiple medical problems. HTN: On Entresto, metoprolol.  Check BMP, CBC High cholesterol: Unable to treat due to intolerances, Repatha?  Patient is not really interested.  We are not checking routine FLP. IBS: Ongoing stomach pain, postprandial nausea ("today for the  first time I don't have nausea").  Abdominal exam show tenderness at the epigastric area, not a new finding, see last GI note 08-2020.  Hemoccult provided, we are checking a CBC.   If any evidence of bleeding we will asked GI to reeval.  Continue Bentyl, Zofran. Last abdominal imaging was CT abdomen with contrast 2018, at the time she had very cholecystic fluid, since then she had a cholecystectomy.  We will get a CT abdomen and pelvis due to persistent symptoms . Anxiety, depression, insomnia: See last visit, patient reports this is not a problem, well-controlled with sertraline and trazodone Vitamin D deficiency: On 4000 units daily, checking vitamin D Vitamin B12 deficiency: Taking supplements, dose?  Check B12 and homocystine level OSA: Sleep study 11/22/2020 show mild sleep apnea, further advised per pulmonary Poor compliance: Was seen by our clinical pharmacist 10/21/2020, they noted poor adherence to medications.  Today patient states she does take her medications regularly. Flu shot: Declined COVID-vaccine: Has consistently declined RTC 3 months    This visit occurred during the SARS-CoV-2 public health emergency.  Safety protocols were in place, including screening questions prior to the visit, additional usage of staff PPE, and extensive cleaning of exam room while observing appropriate contact time as indicated for disinfecting solutions.

## 2020-11-28 NOTE — Patient Instructions (Addendum)
At your last visit we ordered a mammogram for you- this was not done- please stop downstairs ([address]) to schedule at your earliest convenience.   Recommend to proceed with the following vaccines at your pharmacy:  Shingrix (shingles) Tdap (tetanus)  Return the stool card  GO TO THE LAB : Get the blood work     [place], [place] back for   a checkup in 3 months

## 2020-11-30 NOTE — Assessment & Plan Note (Signed)
Fatigue: See previous visit, chronic fatigue worse since she had COVID and a P.E.  11-2019. Plan: Check labs today, suspect a large part of this issue is d/t FM.  Other factors are mild OSA and multiple medical problems. HTN: On Entresto, metoprolol.  Check BMP, CBC High cholesterol: Unable to treat due to intolerances, Repatha?  Patient is not really interested.  We are not checking routine FLP. IBS: Ongoing stomach pain, postprandial nausea ("today for the  first time I don't have nausea").  Abdominal exam show tenderness at the epigastric area, not a new finding, see last GI note 08-2020.  Hemoccult provided, we are checking a CBC.   If any evidence of bleeding we will asked GI to reeval.  Continue Bentyl, Zofran. Last abdominal imaging was CT abdomen with contrast 2018, at the time she had very cholecystic fluid, since then she had a cholecystectomy.  We will get a CT abdomen and pelvis due to persistent symptoms . Anxiety, depression, insomnia: See last visit, patient reports this is not a problem, well-controlled with sertraline and trazodone Vitamin D deficiency: On 4000 units daily, checking vitamin D Vitamin B12 deficiency: Taking supplements, dose?  Check B12 and homocystine level OSA: Sleep study 11/22/2020 show mild sleep apnea, further advised per pulmonary Poor compliance: Was seen by our clinical pharmacist 10/21/2020, they noted poor adherence to medications.  Today patient states she does take her medications regularly. Flu shot: Declined COVID-vaccine: Has consistently declined RTC 3 months

## 2020-12-01 ENCOUNTER — Telehealth: Payer: Self-pay | Admitting: Cardiology

## 2020-12-01 DIAGNOSIS — I10 Essential (primary) hypertension: Secondary | ICD-10-CM

## 2020-12-01 DIAGNOSIS — I428 Other cardiomyopathies: Secondary | ICD-10-CM

## 2020-12-01 DIAGNOSIS — R5383 Other fatigue: Secondary | ICD-10-CM

## 2020-12-01 NOTE — Addendum Note (Signed)
Addended byDamita Dunnings D on: 12/01/2020 09:27 AM   Modules accepted: Orders

## 2020-12-01 NOTE — Telephone Encounter (Signed)
Will forward to Dr Marlou Porch for review .Adonis Housekeeper

## 2020-12-01 NOTE — Telephone Encounter (Signed)
Given the CT scan finding, lets repeat echocardiogram.  It has been approximately 1 year.  Lets make sure that there has been no significant change in overall heart status.  The echocardiogram will give [place] more measurements.  Candee Furbish, MD

## 2020-12-01 NOTE — Telephone Encounter (Signed)
Patient states she had a CT on 10/7 and it showed her an enlarged heart. She would like Dr. Marlou Porch to review the results and have someone call her to explain what it means.

## 2020-12-02 ENCOUNTER — Other Ambulatory Visit (INDEPENDENT_AMBULATORY_CARE_PROVIDER_SITE_OTHER): Payer: Medicare Other

## 2020-12-02 ENCOUNTER — Other Ambulatory Visit: Payer: Self-pay

## 2020-12-02 DIAGNOSIS — E559 Vitamin D deficiency, unspecified: Secondary | ICD-10-CM

## 2020-12-02 DIAGNOSIS — E538 Deficiency of other specified B group vitamins: Secondary | ICD-10-CM | POA: Diagnosis not present

## 2020-12-02 DIAGNOSIS — R5382 Chronic fatigue, unspecified: Secondary | ICD-10-CM | POA: Diagnosis not present

## 2020-12-02 DIAGNOSIS — I10 Essential (primary) hypertension: Secondary | ICD-10-CM

## 2020-12-03 LAB — CBC WITH DIFFERENTIAL/PLATELET
Absolute Monocytes: 359 cells/uL (ref 200–950)
Basophils Absolute: 32 cells/uL (ref 0–200)
Basophils Relative: 0.5 %
Eosinophils Absolute: 69 cells/uL (ref 15–500)
Eosinophils Relative: 1.1 %
HCT: 39.4 % (ref 35.0–45.0)
Hemoglobin: 13.1 g/dL (ref 11.7–15.5)
Lymphs Abs: 2539 cells/uL (ref 850–3900)
MCH: 30.3 pg (ref 27.0–33.0)
MCHC: 33.2 g/dL (ref 32.0–36.0)
MCV: 91 fL (ref 80.0–100.0)
MPV: 10.9 fL (ref 7.5–12.5)
Monocytes Relative: 5.7 %
Neutro Abs: 3301 cells/uL (ref 1500–7800)
Neutrophils Relative %: 52.4 %
Platelets: 214 10*3/uL (ref 140–400)
RBC: 4.33 10*6/uL (ref 3.80–5.10)
RDW: 13.2 % (ref 11.0–15.0)
Total Lymphocyte: 40.3 %
WBC: 6.3 10*3/uL (ref 3.8–10.8)

## 2020-12-03 LAB — VITAMIN D 25 HYDROXY (VIT D DEFICIENCY, FRACTURES): Vit D, 25-Hydroxy: 44 ng/mL (ref 30–100)

## 2020-12-03 LAB — BASIC METABOLIC PANEL
BUN: 15 mg/dL (ref 7–25)
CO2: 25 mmol/L (ref 20–32)
Calcium: 8.8 mg/dL (ref 8.6–10.4)
Chloride: 105 mmol/L (ref 98–110)
Creat: 0.87 mg/dL (ref 0.60–1.00)
Glucose, Bld: 107 mg/dL — ABNORMAL HIGH (ref 65–99)
Potassium: 4.2 mmol/L (ref 3.5–5.3)
Sodium: 141 mmol/L (ref 135–146)

## 2020-12-03 LAB — HOMOCYSTEINE: Homocysteine: 6.2 umol/L (ref <10.4)

## 2020-12-03 LAB — B12 AND FOLATE PANEL
Folate: 11.5 ng/mL
Vitamin B-12: 1130 pg/mL — ABNORMAL HIGH (ref 200–1100)

## 2020-12-04 NOTE — Telephone Encounter (Signed)
Pt is aware of order for echo.  She is agreeable and will await a call to be scheduled.  She is aware of our [facility] location and echo will be completed there.

## 2020-12-05 NOTE — Addendum Note (Signed)
Addended by: Damita Dunnings D on: 12/05/2020 10:14 AM   Modules accepted: Orders

## 2020-12-08 ENCOUNTER — Telehealth (HOSPITAL_BASED_OUTPATIENT_CLINIC_OR_DEPARTMENT_OTHER): Payer: Self-pay | Admitting: Cardiology

## 2020-12-08 ENCOUNTER — Telehealth: Payer: Self-pay | Admitting: Pulmonary Disease

## 2020-12-08 NOTE — Telephone Encounter (Signed)
I called Patient to schedule with Dr. Halford Chessman. Patient depends on son for transportation and can only come on Fridays anytime, or 9am OV on other days.  Dr. Halford Chessman has nothing that will work for Patient schedule at this time.  Dr. Juanetta Gosling schedule only open through 02/23/21.  Recall placed and message sent to front desk scheduling to schedule once Dr. Juanetta Gosling schedule available.  Message received- Marshell Garfinkel, MD  Elton Sin, LPN Sleep study shows mild OSA. She also has restless leg syndrome and is on requip.   Former patient of Dr. Halford Chessman. Can you make a referral to him to re evaluate. Thanks

## 2020-12-08 NOTE — Telephone Encounter (Signed)
Left message for patient to call and schedule Echocardiogram (to be scheduled at [facility])  ordered by Dr.Skains

## 2020-12-12 ENCOUNTER — Other Ambulatory Visit: Payer: Self-pay

## 2020-12-12 ENCOUNTER — Encounter (HOSPITAL_BASED_OUTPATIENT_CLINIC_OR_DEPARTMENT_OTHER): Payer: Self-pay

## 2020-12-12 ENCOUNTER — Ambulatory Visit (HOSPITAL_BASED_OUTPATIENT_CLINIC_OR_DEPARTMENT_OTHER)
Admission: RE | Admit: 2020-12-12 | Discharge: 2020-12-12 | Disposition: A | Discharge status: Home / Self Care | Payer: Medicare Other | Source: Ambulatory Visit | Attending: Internal Medicine | Admitting: Internal Medicine

## 2020-12-12 DIAGNOSIS — R1010 Upper abdominal pain, unspecified: Secondary | ICD-10-CM | POA: Insufficient documentation

## 2020-12-12 DIAGNOSIS — R109 Unspecified abdominal pain: Secondary | ICD-10-CM | POA: Diagnosis not present

## 2020-12-12 DIAGNOSIS — R110 Nausea: Secondary | ICD-10-CM | POA: Diagnosis not present

## 2020-12-12 MED ORDER — IOHEXOL 300 MG/ML  SOLN
100.0000 mL | Freq: Once | INTRAMUSCULAR | Status: AC | PRN
  Administered 2020-12-12: 100 mL via INTRAVENOUS

## 2020-12-16 ENCOUNTER — Ambulatory Visit (INDEPENDENT_AMBULATORY_CARE_PROVIDER_SITE_OTHER): Payer: Medicare Other | Admitting: Pharmacist

## 2020-12-16 DIAGNOSIS — I10 Essential (primary) hypertension: Secondary | ICD-10-CM

## 2020-12-16 DIAGNOSIS — Z9114 Patient's other noncompliance with medication regimen: Secondary | ICD-10-CM

## 2020-12-16 DIAGNOSIS — Z748 Other problems related to care provider dependency: Secondary | ICD-10-CM

## 2020-12-16 DIAGNOSIS — E782 Mixed hyperlipidemia: Secondary | ICD-10-CM

## 2020-12-16 DIAGNOSIS — F419 Anxiety disorder, unspecified: Secondary | ICD-10-CM

## 2020-12-16 DIAGNOSIS — I5042 Chronic combined systolic (congestive) and diastolic (congestive) heart failure: Secondary | ICD-10-CM

## 2020-12-19 ENCOUNTER — Other Ambulatory Visit: Payer: Self-pay | Admitting: Cardiology

## 2020-12-19 ENCOUNTER — Ambulatory Visit (INDEPENDENT_AMBULATORY_CARE_PROVIDER_SITE_OTHER): Payer: Medicare Other

## 2020-12-19 ENCOUNTER — Other Ambulatory Visit: Payer: Self-pay

## 2020-12-19 ENCOUNTER — Other Ambulatory Visit: Payer: Self-pay | Admitting: Internal Medicine

## 2020-12-19 DIAGNOSIS — R5383 Other fatigue: Secondary | ICD-10-CM | POA: Diagnosis not present

## 2020-12-19 DIAGNOSIS — I428 Other cardiomyopathies: Secondary | ICD-10-CM | POA: Diagnosis not present

## 2020-12-19 DIAGNOSIS — I10 Essential (primary) hypertension: Secondary | ICD-10-CM | POA: Diagnosis not present

## 2020-12-19 LAB — ECHOCARDIOGRAM COMPLETE
AR max vel: 1.38 cm2
AV Area VTI: 1.61 cm2
AV Area mean vel: 1.45 cm2
AV Mean grad: 4 mmHg
AV Peak grad: 8.5 mmHg
AV Vena cont: 0.26 cm
Ao pk vel: 1.46 m/s
Area-P 1/2: 2.3 cm2
P 1/2 time: 596 msec
S' Lateral: 2.99 cm

## 2020-12-19 MED ORDER — PERFLUTREN LIPID MICROSPHERE
1.0000 mL | INTRAVENOUS | Status: AC | PRN
Start: 2020-12-19 — End: 2020-12-19
  Administered 2020-12-19: 2 mL via INTRAVENOUS

## 2020-12-19 NOTE — Patient Instructions (Signed)
Visit Information  Comprehensive medication review performed; medication list updated in electronic medical record  Hypertension / Congestive Heart Failure: Blood pressure goal <140/90  CHF goals: prevent exacerbation of CHF and minimize symptoms of CHF  BP Readings from Last 3 Encounters:  11/28/20 126/90  09/05/20 134/82  08/28/20 138/80    current treatment: Entresto 97/103 mg twice a day Metoprolol ER 50mg  - take 0.5 tablet = 25mg  daily  Interventions:   Educated on benefits of Entresto and metoprolol for heart health Counseled to monitor weight and heart rate Restart metoprolol ER  - we had sent in prescription for 25mg  daily so you do not need to half any more. Make appointment for follow up with Dr Marlou Porch.   Hyperlipidemia: Last LDL at goal of <100 but lipids last checked in 2017  Lab Results  Component Value Date   LDLCALC 74 10/30/2015   [place] 158 (H) 09/18/2015   LDLCALC 101 (H) 07/14/2015   current treatment: No current pharmacotherapy   Interventions:  Recommended limit intake of saturated fat and cholesterol Consider rechecking lipid panel with next labs  Depression/sleep disturbance: current treatment: Sertraline 50mg  - take 1.5 tablets = 75mg  daily  Trazodone 50mg  - take 1 or 2 tablets at bedtime Interventions:  Continue current therapy  GERD with Barrett's Esophagus / abdominal cramps: Controlled Current therapy:  Omeprazole 40mg  twice a day Dicyclomine 20mg  take 1 tablet 4 times a day before meal and at bedtime. .  Interventions:  Recommended continue current therapy.   Health Maintenance:  Discussed getting vaccines - Tdap, annual flu vaccine, COVID vaccine.  Patient plans to get Tdap and flu vaccine at next PCP appointment but she declines to get COVID vaccine.   Patient Goals/Self-Care Activities Over the next 90 days, patient will:  take medications as prescribed Restart taking sertraline 50gm = take 1 and 1/2 tablet = 75mg   daily  Follow Up Plan: Telephone follow up appointment with care management team member scheduled for:  6 weeks.   Patient verbalizes understanding of instructions provided today and agrees to view in [facility].   Telephone follow up appointment with care management team member scheduled for: 6 weeks  [name], PharmD Clinical Pharmacist [facility]

## 2020-12-19 NOTE — Chronic Care Management (AMB) (Signed)
Chronic Care Management Pharmacy Note  12/19/2020 Name:  [name] MRN:  [id number] DOB:  [birth year]  Summary: Improved adherence to metoprolol. Tolerating well.  Patient is only taking sertraline 40m 1 tablet daily - med list has 1.5 tablets daily  Recommendations/Changes made from today's visit: Patient to restart sertraline 731mdaily Will have lipids rechecked at appointment with cardiology 01/2021.   Subjective: [name] an [age].o. year old female who is a primary patient of Paz, JoAlda BertholdMD.  The CCM team was consulted for assistance with disease management and care coordination needs.    Engaged with patient by telephone for follow up visit in response to provider referral for pharmacy case management and/or care coordination services.   Consent to Services:  The patient was given information about Chronic Care Management services, agreed to services, and gave verbal consent prior to initiation of services.  Please see initial visit note for detailed documentation.   Patient Care Team: PaColon BranchMD as PCP - General SkJerline PainMD as PCP - Cardiology (Cardiology) MaBjorn LoserMD as Consulting Physician (Urology) PaMaia BreslowMD as Consulting Physician (Orthopedic Surgery) KaAlden HippMD as Consulting Physician (Obstetrics and Gynecology) DuPhylliss BobMD as Consulting Physician (Orthopedic Surgery) AjRed ChristiansMD as Referring Physician (Pain Medicine) StLadene ArtistMD as Consulting Physician (Gastroenterology) EcCherre RobinsRPEttrickPharmacist)  Recent office visits: 11/28/2020 - Internal Med (Dr PaLarose KellsSeen for chronic fatigue.  11/20/2020 - Internal Med (Dr PaLarose KellsVideo for chronic fatigue. No medication changes 10/11/20-Amy Hopkins, LPN. Seen for Medicare Annual Wellness. [facility]. Follow up in 1 year. 08/21/20-Jose E.Ladona HornsMD (PCP) General follow up. Labs ordered. Mammogram ordered. Ambulatory  referral to Pulmonology. Follow up in 3 months.  Recent consult visits: 09/05/20 (Pulmonology) Dr MaConcepcion Living Seen for follow up for post COVID-19. We will check a blood test called D-dimer. If normal then we can stop the Eliquis CT of chest ordered. Follow up in clinic after CT. 08/28/20 (Gastroenterology) Amy S.Esterwood, PA-C. Seen for abdominal pain. Labs ordered. Start Zofran 4 mg every 6 hours as needed for nausea. Was advised to use Imodium up to 6/day as needed if diarrhea is persisting 06/24/20 (Gastroenterology) MaPricilla RiffleStFuller PlanMD. Seen for Irritable bowel syndrome. 04/25/20-(Pain Medicine) SaShelby DubinPA-C. Seen for back pain and leg pain. Referral to Neurosurgery for lumbar spinal stenosis and right leg radiculopathy. Follow up in 12 weeks  Hospital visits: None in previous 6 months  Objective:  Lab Results  Component Value Date   CREATININE 0.87 12/02/2020   CREATININE 0.93 08/28/2020   CREATININE 0.90 08/21/2020    Lab Results  Component Value Date   HGBA1C 5.6 04/15/2020   Last diabetic Eye exam: No results found for: HMDIABEYEEXA  Last diabetic Foot exam: No results found for: HMDIABFOOTEX      Component Value Date/Time   CHOL 148 10/30/2015 1102   TRIG 68.0 10/30/2015 1102   HDL 60.50 10/30/2015 1102   CHOLHDL 2 10/30/2015 1102   VLDL 13.6 10/30/2015 1102   LDLCALC 74 10/30/2015 1102   LDLDIRECT 166.0 04/14/2010 1539    Hepatic Function Latest Ref Rng & Units 08/28/2020 12/07/2019 12/05/2019  Total Protein 6.0 - 8.3 g/dL 7.2 7.3 6.7  Albumin 3.5 - 5.2 g/dL 4.0 3.7 -  AST 0 - 37 U/L '14 19 14  ' ALT 0 - 35 U/L '9 14 10  ' Alk Phosphatase 39 - 117  U/L 82 67 -  Total Bilirubin 0.2 - 1.2 mg/dL 0.6 0.8 0.6  Bilirubin, Direct 0.0 - 0.3 mg/dL - - -    Lab Results  Component Value Date/Time   TSH 1.89 04/15/2020 02:40 PM   TSH 1.62 12/16/2017 02:16 PM    CBC Latest Ref Rng & Units 12/02/2020 08/28/2020 08/21/2020  WBC 3.8 - 10.8 Thousand/uL 6.3 6.2 6.8   Hemoglobin 11.7 - 15.5 g/dL 13.1 13.3 12.8  Hematocrit 35.0 - 45.0 % 39.4 40.7 38.4  Platelets 140 - 400 Thousand/uL 214 212.0 211.0    Lab Results  Component Value Date/Time   VD25OH 44 12/02/2020 07:48 AM   VD25OH 28.12 (L) 04/15/2020 02:40 PM   VD25OH 36 09/30/2010 03:48 PM    Clinical ASCVD: No  The ASCVD Risk score (Arnett DK, et al., 2019) failed to calculate for the following reasons:   Cannot find a previous HDL lab   Cannot find a previous total cholesterol lab    Other:   DEXA 08/15/2019 AP Spine L1-L3 02/15/2019 74.3 Normal 0.4 1.224 g/cm2 AP Spine L1-L3 10/04/2016 72.0 Normal 0.2 1.190 g/cm2   DualFemur Neck Left 02/15/2019 74.3 Osteopenia -1.8 0.787 g/cm2 DualFemur Neck Left 10/04/2016 72.0 Osteopenia -1.4 0.838 g/cm2   DualFemur Total Mean 02/15/2019 74.3 Osteopenia -1.2 0.860 g/cm2 DualFemur Total Mean 10/04/2016 72.0 Normal -0.8 0.910 g/cm2  FRAX* 10-year Probability of Fracture Based on femoral neck BMD: DualFemur (Left)   Major Osteoporotic Fracture: 10.9% Hip Fracture:                2.3% Population:                  [place] (Caucasian) Risk Factors:                None  Social History   Tobacco Use  Smoking Status Former   Packs/day: 0.50   Years: 10.00   Pack years: 5.00   Types: Cigarettes   Quit date: 02/09/1975   Years since quitting: 45.8  Smokeless Tobacco Never  Tobacco Comments   started at age 35.   quit in the 1970s.   BP Readings from Last 3 Encounters:  11/28/20 126/90  09/05/20 134/82  08/28/20 138/80   Pulse Readings from Last 3 Encounters:  11/28/20 65  09/05/20 66  08/28/20 75   Wt Readings from Last 3 Encounters:  11/28/20 242 lb 6 oz (109.9 kg)  11/20/20 240 lb (108.9 kg)  10/11/20 243 lb (110.2 kg)    Assessment: Review of patient past medical history, allergies, medications, health status, including review of consultants reports, laboratory and other test data, was performed as part of comprehensive evaluation and  provision of chronic care management services.   SDOH:  (Social Determinants of Health) assessments and interventions performed:  SDOH Interventions    Flowsheet Row Most Recent Value  SDOH Interventions   Transportation Interventions [facility]  [sent request for care coordination to reach out to patient regarding]       CCM Care Plan  Allergies  Allergen Reactions   Dextromethorphan-Guaifenesin Nausea And Vomiting   Morphine Nausea And Vomiting   Gabapentin Other (See Comments)    Dizziness, made her feel loopy   Penicillins Rash    "> 30 years ago; best I can remember it was just a light rash on my arm" Did it involve swelling of the face/tongue/throat, SOB, or low BP? No Did it involve sudden or severe rash/hives, skin peeling, or any reaction  on the inside of your mouth or nose? Unknown Did you need to seek medical attention at a hospital or doctor's office? No When did it last happen?      more than 30 years  If all above answers are "NO", may proceed with cephalosporin use.     Medications Reviewed Today     Reviewed by [name], RPH-CPP (Pharmacist) on 12/16/20 at 13  Med List Status: <None>   Medication Order Taking? Sig Documenting Provider Last Dose Status Informant  albuterol (VENTOLIN HFA) 108 (90 Base) MCG/ACT inhaler [id number] Yes Inhale 2 puffs into the lungs every 6 (six) hours as needed for wheezing (Chest congestion). Colon Branch, MD Taking Active Multiple Informants  Cholecalciferol (VITAMIN D) 50 MCG (2000 UT) CAPS [id number] Yes Take 4,000 Units by mouth daily. [provider] Taking Active Multiple Informants  Cyanocobalamin (VITAMIN B12) 500 MCG TABS [id number] Yes Take 1 tablet by mouth daily. [provider] Taking Active   dicyclomine (BENTYL) 20 MG tablet [id number] Yes Take 1 tablet (20 mg total) by mouth 4 (four) times daily -  before meals and at bedtime. Ladene Artist, MD Taking Active            Med  Note [facility], [place] Nov 20, 2020  9:35 AM) Forgets often  [place] 97-103 MG [id number] Yes TAKE 1 TABLET TWICE A DAY Jerline Pain, MD Taking Active   HYDROcodone-acetaminophen [facility]) 10-325 MG tablet [id number] Yes Take 1 tablet by mouth every 6 (six) hours as needed. [provider] Taking Active   loperamide (IMODIUM) 2 MG capsule [id number] Yes Take by mouth as needed for diarrhea or loose stools. [provider] Taking Active   metoprolol succinate (TOPROL-XL) 25 MG 24 hr tablet [id number] Yes Take 25 mg by mouth daily. [provider] Taking Active   NARCAN 4 MG/0.1ML LIQD nasal spray kit [id number] No Place 1 spray into the nose once as needed (opioid od).  Patient not taking: Reported on 12/16/2020   [provider] Not Taking Active            Med Note [facility], CARLOS A   Thu Jan 17, 2020  1:27 PM)    omeprazole (PRILOSEC) 40 MG capsule [id number] Yes Take 1 capsule (40 mg total) by mouth 2 (two) times daily. Ladene Artist, MD Taking Active   ondansetron [facility]) 4 MG tablet [id number] Yes Take 1 tablet by mouth 30 minutes ac meals and hs x 3 days then PRN Ladene Artist, MD Taking Active   pregabalin (LYRICA) 75 MG capsule [id number] Yes Take 75 mg by mouth 3 (three) times daily. [provider] Taking Active   promethazine (PHENERGAN) 25 MG tablet [id number] Yes Take 25 mg by mouth every 6 (six) hours as needed for nausea or vomiting. [provider] Taking Active   rOPINIRole (REQUIP) 1 MG tablet [id number] Yes TAKE 1 TABLET BY MOUTH TWICE A DAY  Patient taking differently: Take 1 mg by mouth at bedtime. May take another tablet during night if she wakes up   Colon Branch, MD Taking Active   sertraline (ZOLOFT) 50 MG tablet [id number] Yes Take 1.5 tablets (75 mg total) by mouth daily. Colon Branch, MD Taking Active   traZODone (DESYREL) 50 MG tablet [id number] Yes Take 1-2 tablets (50-100 mg total) by mouth at bedtime. Colon Branch, MD Taking Active  Patient Active Problem List   Diagnosis Date Noted   Pulmonary embolism ([place]) 12/07/2019   Gastric polyps    Fibromyalgia, see office visit note from 02/14/2018 02/15/2018   Paresthesia 12/29/2017   Gait abnormality 09/21/2017   Dizziness 09/21/2017   Raynaud phenomenon 07/22/2017   Nonischemic cardiomyopathy ([place]) 05/18/2017   Chronic combined systolic and diastolic CHF (congestive heart failure) ([place]) 05/05/2017   Headache 05/05/2017   Chest pain 05/04/2017   Insomnia 10/23/2016   Morbid obesity ([place]) 12/16/2015   Myalgia 07/10/2015   Hyperlipidemia 07/10/2015   PCP NOTES >>>>>>>>>>>>>>>>> 10/31/2014   Anxiety and depression 09/10/2013   Vitamin B 12 deficiency 04/09/2013   Chronic cystitis 05/29/2012   Annual physical exam 06/14/2011   PARESTHESIA 04/01/2009   DJD (degenerative joint disease) 10/21/2008   BARRETTS ESOPHAGUS 07/19/2007   RESTLESS LEGS SYNDROME 05/11/2007   Migraine-- on topamax, rx by welness center 07/27/2006   Hypertension 07/27/2006   Osteopenia 07/27/2006   Chronic fatigue 07/27/2006    Immunization History  Administered Date(s) Administered   Fluad Quad(high Dose 65+) 10/31/2018   Influenza Split 02/17/2011   Influenza Whole 01/14/2010   Influenza, High Dose Seasonal PF 01/24/2015, 12/16/2017   Influenza,inj,Quad PF,6+ Mos 10/22/2013   Influenza-Unspecified 03/13/2016, 10/20/2016   Pneumococcal Conjugate-13 09/10/2013   Pneumococcal Polysaccharide-23 04/14/2010, 08/21/2020   Td 02/08/1998, 04/14/2010   Zoster, Live 12/06/2013    Conditions to be addressed/monitored: CHF, HTN, HLD, Depression, and history of PE - related to [place]; osteopenia; GERD with Barrett's esophagus; neuropathy; fibromylagia  Care Plan : General Pharmacy (Adult)  Updates made by [name], RPH-CPP since 12/19/2020 12:00 AM     Problem: Chronic Conditions: Heart failure, hypertension, Irritable bowel syndrome; Restless leg  syndrome, depression, insomnia, osteopenia, chronic pain, fibromyalgia, GERD with Barrett's esophagus   Priority: High  Onset Date: 10/21/2020     Goal: Medication and Chronic Care Management with improve patient knowledge about disease states, treatment and goals and improved adhernece   Start Date: 10/21/2020  Priority: High  Note:   Current Barriers:  Unable to independently monitor therapeutic efficacy Does not adhere to prescribed medication regimen  Pharmacist Clinical Goal(s):  Over the next 90 days, patient will achieve adherence to monitoring guidelines and medication adherence to achieve therapeutic efficacy maintain control of hypertension, acid reflux and mood as evidenced by maintaining goals listed below  adhere to prescribed medication regimen as evidenced by refill history  through collaboration with PharmD and provider.   Interventions: 1:1 collaboration with Colon Branch, MD regarding development and update of comprehensive plan of care as evidenced by provider attestation and co-signature Inter-disciplinary care team collaboration (see longitudinal plan of care) Comprehensive medication review performed; medication list updated in electronic medical record  Hypertension / Congestive Heart Failure: Controlled Blood pressure goal <140/90  CHF goals: prevent exacerbation of CHF and minimize symptoms of CHF Managed by cardiologist Dr Marlou Porch - last visit was 01/2020 HF with improved EF Noted to have systolic-diastolic heart failure / non ischemic cardiomyopathy EF was 35% initially; Most recent EF was 60-65% on 12/08/2019  BP Readings from Last 3 Encounters:  11/28/20 126/90  09/05/20 134/82  08/28/20 138/80  current treatment: Entresto 97/103 mg twice a day Metoprolol ER 14m - take 0.5 tablet = 291mdaily  10/21/2020:Refill record show that metoprolol has not been filled since 12/07/2019. Patient is not sure why she is not taking. Unable to find reference to stop  in EMR.  12/16/2020: patient has restarted metoprolol. Tolerating well  Current home blood pressure readings: no currently checking blood pressure at home. Patient report she has bought 2 blood pressure cuffs in past and both broke so she does not want to purchase another Denies hypotensive or hypertensive symptoms Interventions:   Educated on benefits of Entresto and metoprolol for heart health Counseled on monitor weight and heart rate Follow up with cardiologist as planned 01/2021  Hyperlipidemia: Last LDL form 2017 was <100 but recent CT of lung noted aortic athlesclerolsis on 11/17/2020. Could consider LDL goal of <70 Lab Results  Component Value Date   LDLCALC 74 10/30/2015   LDLCALC 158 (H) 09/18/2015   LDLCALC 101 (H) 07/14/2015  current treatment: No current pharmacotherapy   Medications previously tried: pravastatin, rosuvastatin (both noted to cause myalgias); ezetimibe tried in 2018 - caused joint pain  Interventions:  Recommended limit intake of saturated fat and cholesterol Looks like notes for next cardiology appt mentions rechecking lipids.  Discussed non statin options for treating cholesterol / lower CVD risk. Patient is open to consider. Reviewed her [place] is tier 2 but requires prior authorization (estimated cost $50/30 days) Nexletol is tier 3 but requires prior authorization (estimated cost 60% of med cost)  Depression/sleep disturbance: Controlled;  current treatment: Sertraline 28m - take 1.5 tablets = 772mdaily (patient has only been taking 1 tablet daily) Trazodone 5044m take 1 or 2 tablets at bedtime Past medications tried: escitalopram / Lexapro in 2015; Effexor Interventions:  Recommended she restart sertraline dose of 1.5 tablets daily. Continue current therapy  GERD with Barrett's Esophagus / abdominal cramps: Controlled Current therapy:  Omeprazole 66m13mice a day Dicyclomine 20mg109me 1 tablet 4 times a day  before meal and at bedtime.  Patient reports rare reflux symptoms since starting omeprazole. Usually occurs when eating [place]. [place] has abdominal cramping.  Interventions:  Recommended continue current therapy.   Health Maintenance:  Discussed getting vaccines - Tdap, annual flu vaccine, COVID vaccine.  Patient declines to get COVID or flu vaccine.  Considering Tdap  Patient Goals/Self-Care Activities Over the next 90 days, patient will:  take medications as prescribed Restart metoprolol ER at 25mg 29my  Follow Up Plan: Telephone follow up appointment with care management team member scheduled for:  6 weeks          Medication Assistance: None required.  Patient affirms current coverage meets needs.  Patient's preferred pharmacy is:  [id number], [place] [address] [id number] [phone] Up:  Patient agrees to Care Plan and Follow-up.  Plan: Telephone follow up appointment with care management team member scheduled for:  6 weeks  Oluwatosin Bracy ECherre RobinsD Clinical Pharmacist [facility]

## 2020-12-25 ENCOUNTER — Telehealth: Payer: Self-pay

## 2020-12-25 NOTE — Telephone Encounter (Signed)
   Telephone encounter was:  Unsuccessful.  12/25/2020 Name: [name] MRN: [id number] DOB: November 30, 1945  Unsuccessful outbound call made today to assist with:  Transportation Needs   Outreach Attempt:  1st Attempt  A HIPAA compliant voice message was left requesting a return call.  Instructed patient to call back at [phone] at their earliest convenience.  [facility] management  [place], [place]  Main Phone: [phone]  E-mail: [email or web address]  Website: [email or web address]

## 2020-12-29 ENCOUNTER — Telehealth: Payer: Self-pay

## 2020-12-29 NOTE — Telephone Encounter (Signed)
   Telephone encounter was:  Unsuccessful.  12/29/2020 Name: ORLINDA SLOMSKI MRN: [id number] DOB: 05-06-1945  Unsuccessful outbound call made today to assist with:  Transportation Needs   Outreach Attempt:  2nd Attempt  A HIPAA compliant voice message was left requesting a return call.  Instructed patient to call back at [phone] at their earliest convenience.  [place] management  [place], [place]  Main Phone: [phone]  E-mail: [email or web address]  Website: [email or web address]

## 2021-01-06 ENCOUNTER — Telehealth: Payer: Self-pay

## 2021-01-06 NOTE — Telephone Encounter (Signed)
   Telephone encounter was:  Unsuccessful.  01/06/2021 Name: [name] MRN: [id number] DOB: 08-Apr-1945  Unsuccessful outbound call made today to assist with:  Transportation Needs   Outreach Attempt:  3rd Attempt.  Referral closed unable to contact patient.  A HIPAA compliant voice message was left requesting a return call.  Instructed patient to call back at [phone] at their earliest convenience. -- Left message for pt to return my call regarding transportation. I also called [facility] [place] [place] phone [phone] and it just kept ringing with no answer.  [name] management  [name], [name]  Main Phone: [phone]  E-mail: [name]  Website: [email or web address]

## 2021-01-07 ENCOUNTER — Telehealth: Payer: Medicare Other

## 2021-01-07 DIAGNOSIS — I10 Essential (primary) hypertension: Secondary | ICD-10-CM

## 2021-01-07 DIAGNOSIS — E782 Mixed hyperlipidemia: Secondary | ICD-10-CM | POA: Diagnosis not present

## 2021-01-07 DIAGNOSIS — I5042 Chronic combined systolic (congestive) and diastolic (congestive) heart failure: Secondary | ICD-10-CM | POA: Diagnosis not present

## 2021-01-07 DIAGNOSIS — F419 Anxiety disorder, unspecified: Secondary | ICD-10-CM

## 2021-01-07 DIAGNOSIS — F32A Depression, unspecified: Secondary | ICD-10-CM

## 2021-01-09 ENCOUNTER — Telehealth: Payer: Self-pay | Admitting: *Deleted

## 2021-01-09 ENCOUNTER — Ambulatory Visit: Payer: Medicare Other | Admitting: Physician Assistant

## 2021-01-09 NOTE — Telephone Encounter (Signed)
Moved pt due to [place] being out for sickness. Pt is not having any symptoms.

## 2021-01-12 ENCOUNTER — Other Ambulatory Visit: Payer: Self-pay

## 2021-01-12 ENCOUNTER — Ambulatory Visit (HOSPITAL_BASED_OUTPATIENT_CLINIC_OR_DEPARTMENT_OTHER)
Admission: RE | Admit: 2021-01-12 | Discharge: 2021-01-12 | Disposition: A | Discharge status: Home / Self Care | Payer: Medicare Other | Source: Ambulatory Visit | Attending: Internal Medicine | Admitting: Internal Medicine

## 2021-01-12 ENCOUNTER — Encounter (HOSPITAL_BASED_OUTPATIENT_CLINIC_OR_DEPARTMENT_OTHER): Payer: Self-pay

## 2021-01-12 DIAGNOSIS — Z1231 Encounter for screening mammogram for malignant neoplasm of breast: Secondary | ICD-10-CM | POA: Diagnosis not present

## 2021-01-23 ENCOUNTER — Ambulatory Visit (INDEPENDENT_AMBULATORY_CARE_PROVIDER_SITE_OTHER): Payer: Medicare Other | Admitting: Internal Medicine

## 2021-01-23 ENCOUNTER — Encounter: Payer: Self-pay | Admitting: Internal Medicine

## 2021-01-23 VITALS — BP 142/84 | HR 98 | Temp 98.2°F | Resp 20 | Ht 66.0 in | Wt 246.0 lb

## 2021-01-23 DIAGNOSIS — K589 Irritable bowel syndrome without diarrhea: Secondary | ICD-10-CM

## 2021-01-23 DIAGNOSIS — R051 Acute cough: Secondary | ICD-10-CM

## 2021-01-23 DIAGNOSIS — J069 Acute upper respiratory infection, unspecified: Secondary | ICD-10-CM

## 2021-01-23 LAB — POCT INFLUENZA A/B
Influenza A, POC: NEGATIVE
Influenza B, POC: NEGATIVE

## 2021-01-23 LAB — POCT RAPID STREP A (OFFICE): Rapid Strep A Screen: NEGATIVE

## 2021-01-23 LAB — POC COVID19 BINAXNOW: SARS Coronavirus 2 Ag: NEGATIVE

## 2021-01-23 MED ORDER — BENZONATATE 200 MG PO CAPS: 200.0000 mg | ORAL_CAPSULE | Freq: Three times a day (TID) | ORAL | 0 refills | Status: DC | PRN

## 2021-01-23 MED ORDER — HYDROCODONE BIT-HOMATROP MBR 5-1.5 MG/5ML PO SOLN: 5.0000 mL | Freq: Four times a day (QID) | ORAL | 0 refills | Status: DC | PRN

## 2021-01-23 NOTE — Patient Instructions (Addendum)
Rest, fluids , tylenol  For cough:  -Tessalon Perles 3 times a day as needed -You can also take hydrocodone syrup every 6 hours if the cough is severe. -You cannot mix this the syrup with your pain medication   For nasal congestion: -Use OTC  Flonase or Nasacort (generics are okay and less expensive): 2 nasal sprays on each side of the nose in the morning until you feel better -Use OTC Astepro 2 nasal sprays on each side of the nose twice daily until better   Avoid decongestants such as  Pseudoephedrine or phenylephrine     Call if not gradually better over the next  10 days   Call anytime if the symptoms are severe, you have high fever, short of breath, chest pain

## 2021-01-23 NOTE — Progress Notes (Signed)
Subjective:    Patient ID: [name], female    DOB: [birth year], 75 y.o.   MRN: [id number]  DOS:  01/23/2021 Type of visit - description: Acute  Symptoms a started 4 days ago: Initially only sore throat. Subsequently developed cough which has become very intense and frequent. Cough associated with upper abdominal pain. No sputum production.   No fever chills No nausea, vomiting, diarrhea. Some wheezing? Some sinus congestion  Review of Systems See above   Past Medical History:  Diagnosis Date   Allergy    Anemia    Anxiety    Barrett's esophagus    Bilateral carpal tunnel syndrome    Dr. Eddie Dibbles, having injection therapy   C. difficile diarrhea 08/01/2012   severe 2014   Carotid arterial disease ([place])    Cataract    removed both eyes    Chronic combined systolic and diastolic CHF (congestive heart failure) ([place])    Chronic cystitis    Dr. Matilde Sprang   Chronic lower back pain    Chronic pain syndrome    Depression    Dizziness    DJD (degenerative joint disease)    bilateral hands; knees   Family history of anesthesia complication    Mother had severe N/V   Fibromyalgia    GERD (gastroesophageal reflux disease)    H/O cardiac catheterization    (-) cath 12-2002  , cath again 2011 (-)   History of colon polyps    HTN (hypertension)    Hyperlipidemia    past hx    IBS (irritable bowel syndrome)    Insomnia    Migraine    Morbid obesity ([place])    Non-ischemic cardiomyopathy ([place])    Osteoporosis    pt unsure of this   RLS (restless legs syndrome)    Vertigo    Vitamin B 12 deficiency 04/09/2013    Past Surgical History:  Procedure Laterality Date   Arm surgery Left    "don't remember what they did; arm wasn't broken"   BILATERAL KNEE ARTHROSCOPY Bilateral    BIOPSY  08/07/2018   Procedure: BIOPSY;  Surgeon: Ladene Artist, MD;  Location: [facility];  Service: Endoscopy;;   CARDIAC CATHETERIZATION  01/22/10   clean cath   CATARACT EXTRACTION W/  INTRAOCULAR LENS  IMPLANT, BILATERAL Bilateral    CHOLECYSTECTOMY N/A 10/25/2016   Procedure: LAPAROSCOPIC CHOLECYSTECTOMY;  Surgeon: Georganna Skeans, MD;  Location: [facility];  Service: General;  Laterality: N/A;   COLONOSCOPY     COLONOSCOPY W/ POLYPECTOMY     ESOPHAGOGASTRODUODENOSCOPY (EGD) WITH PROPOFOL N/A 08/07/2018   Procedure: ESOPHAGOGASTRODUODENOSCOPY (EGD) WITH PROPOFOL;  Surgeon: Ladene Artist, MD;  Location: [facility];  Service: Endoscopy;  Laterality: N/A;   FOOT SURGERY Bilateral    toenails removed; callus removed on right; hammertoes right"   Knot     "removed from right neck; not a goiter"   LUMBAR DISC SURGERY     L5 S1 anterior fusion   OOPHORECTOMY     RIGHT/LEFT HEART CATH AND CORONARY ANGIOGRAPHY N/A 05/06/2017   Procedure: RIGHT/LEFT HEART CATH AND CORONARY ANGIOGRAPHY;  Surgeon: Nelva Bush, MD;  Location: [facility];  Service: Cardiovascular;  Laterality: N/A;   SHOULDER ARTHROSCOPY Right    TOTAL KNEE ARTHROPLASTY  10/05/2011   Procedure: TOTAL KNEE ARTHROPLASTY;  Surgeon: Hessie Dibble, MD;  Location: [facility];  Service: Orthopedics;  Laterality: Right;   UPPER GASTROINTESTINAL ENDOSCOPY     VAGINAL HYSTERECTOMY  for endometriosis    Allergies as of 01/23/2021       Reactions   Dextromethorphan-guaifenesin Nausea And Vomiting   Morphine Nausea And Vomiting   Gabapentin Other (See Comments)   Dizziness, made her feel loopy   Penicillins Rash   "> 30 years ago; best I can remember it was just a light rash on my arm" Did it involve swelling of the face/tongue/throat, SOB, or low BP? No Did it involve sudden or severe rash/hives, skin peeling, or any reaction on the inside of your mouth or nose? Unknown Did you need to seek medical attention at a hospital or doctor's office? No When did it last happen?      more than 30 years  If all above answers are NO, may proceed with cephalosporin use.        Medication List        Accurate  as of January 23, 2021 11:59 PM. If you have any questions, ask your nurse or doctor.          albuterol 108 (90 Base) MCG/ACT inhaler Commonly known as: VENTOLIN HFA Inhale 2 puffs into the lungs every 6 (six) hours as needed for wheezing (Chest congestion).   benzonatate 200 MG capsule Commonly known as: TESSALON Take 1 capsule (200 mg total) by mouth 3 (three) times daily as needed for cough. Started by: Kathlene November, MD   dicyclomine 20 MG tablet Commonly known as: BENTYL Take 1 tablet (20 mg total) by mouth 4 (four) times daily -  before meals and at bedtime.   Entresto 97-103 MG Generic drug: sacubitril-valsartan TAKE 1 TABLET TWICE A DAY   HYDROcodone bit-homatropine 5-1.5 MG/5ML syrup Commonly known as: HYCODAN Take 5 mLs by mouth every 6 (six) hours as needed for cough. Started by: Kathlene November, MD   HYDROcodone-acetaminophen 10-325 MG tablet Commonly known as: NORCO Take 1 tablet by mouth every 6 (six) hours as needed.   loperamide 2 MG capsule Commonly known as: IMODIUM Take by mouth as needed for diarrhea or loose stools.   metoprolol succinate 25 MG 24 hr tablet Commonly known as: TOPROL-XL Take 25 mg by mouth daily.   Narcan 4 MG/0.1ML Liqd nasal spray kit Generic drug: naloxone Place 1 spray into the nose once as needed (opioid od).   omeprazole 40 MG capsule Commonly known as: PRILOSEC Take 1 capsule (40 mg total) by mouth 2 (two) times daily.   ondansetron 4 MG tablet Commonly known as: ZOFRAN Take 1 tablet by mouth 30 minutes ac meals and hs x 3 days then PRN   pregabalin 75 MG capsule Commonly known as: LYRICA Take 75 mg by mouth 3 (three) times daily.   promethazine 25 MG tablet Commonly known as: PHENERGAN Take 25 mg by mouth every 6 (six) hours as needed for nausea or vomiting.   rOPINIRole 1 MG tablet Commonly known as: REQUIP TAKE 1 TABLET BY MOUTH TWICE A DAY What changed:  when to take this additional instructions   sertraline 50  MG tablet Commonly known as: ZOLOFT Take 1.5 tablets (75 mg total) by mouth daily.   traZODone 50 MG tablet Commonly known as: DESYREL TAKE 1-2 TABLETS BY MOUTH AT BEDTIME.   Vitamin B12 500 MCG Tabs Take 1 tablet by mouth daily.   Vitamin D 50 MCG (2000 UT) Caps Take 4,000 Units by mouth daily.           Objective:   Physical Exam BP (!) 142/84 (BP Location: Left Arm,  Patient Position: Sitting, Cuff Size: Small)    Pulse 98    Temp 98.2 F (36.8 C) (Oral)    Resp 20    Ht _0  (1.676 m)    Wt 246 lb (111.6 kg)    SpO2 96%    BMI 39.71 kg/m  General:   Well developed, NAD, BMI noted. HEENT:  Normocephalic . Face symmetric, atraumatic. TMs normal. Throat: Symmetric, not red.  Nose minimal congestion. Lungs:  CTA B Normal respiratory effort, no intercostal retractions, no accessory muscle use. Heart: RRR,  no murmur.  Lower extremities: no pretibial edema bilaterally  Skin: Not pale. Not jaundice Neurologic:  alert & oriented X3.  Speech normal, gait appropriate for age and unassisted Psych--  Cognition and judgment appear intact.  Cooperative with normal attention span and concentration.  Behavior appropriate. No anxious or depressed appearing.      Assessment    Assessment   HTN Hyperlipidemia-  Pravachol, Crestor: Myalgias. Intolerant to Zetia (joint aches), see OV  06/2016  Depression, Anxiety (on clonazepam), insomnia (on trazodone):  depression x many  years, remotely on zoloft and other meds per psych, I rx lexapro 2015, then was rx effexor x a while Morbid obesity CV: CHF/ non ischemic cardiomyopathy dx 04-2017; cath normal coronaries, EF ~ 30% Carotid artery disease: 1 to 39% bilateral carotids 09-2016, start aspirin per neurology GI:  --GERD, Barrett's esophagus, had a EGD 07/2018  --h/o persistent C. difficile diarrhea 2014 --Cholecystectomy B12 deficiency Osteopenia: Dexa 2015 showed osteopenia, T score -1.9 (10/2016): Rx cs, vit D PULM: --NPSG  2008:  No osa, PLMS 4/hr with arousal and awakening --RLS - Requip  Migraines, f/u [facility] -- off topamax as off 06-2016 MSK: --Back pain, chronic: pain meds rx by ortho, Workers comp MD @ [employer] pain mngmt [place] --DJD,CTS B ([facility]) --Fibromyalgia.  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  Took anticoagulants until 08-2020.  PLAN Cough  Strep test: Negative COVID and influenza tests: Negative Viral URI? Plan: Rest, fluids, Tylenol, symptomatic treatment with Tessalon Perles and hydrocodone.  See AVS Suspected IBS: Had stomach pain and nausea, see last visit, labs and CT abdomen were essentially unremarkable   This visit occurred during the SARS-CoV-2 public health emergency.  Safety protocols were in place, including screening questions prior to the visit, additional usage of staff PPE, and extensive cleaning of exam room while observing appropriate contact time as indicated for disinfecting solutions.

## 2021-01-24 NOTE — Assessment & Plan Note (Addendum)
Cough  Strep test: Negative COVID and influenza tests: Negative Viral URI? Plan: Rest, fluids, Tylenol, symptomatic treatment with Tessalon Perles and hydrocodone.  See AVS Suspected IBS: Had stomach pain and nausea, see last visit, labs and CT abdomen were essentially unremarkable

## 2021-02-03 ENCOUNTER — Ambulatory Visit (INDEPENDENT_AMBULATORY_CARE_PROVIDER_SITE_OTHER): Payer: Medicare Other | Admitting: Pharmacist

## 2021-02-03 DIAGNOSIS — I5042 Chronic combined systolic (congestive) and diastolic (congestive) heart failure: Secondary | ICD-10-CM

## 2021-02-03 DIAGNOSIS — E782 Mixed hyperlipidemia: Secondary | ICD-10-CM

## 2021-02-03 DIAGNOSIS — Z91148 Patient's other noncompliance with medication regimen for other reason: Secondary | ICD-10-CM

## 2021-02-03 DIAGNOSIS — Z9114 Patient's other noncompliance with medication regimen: Secondary | ICD-10-CM

## 2021-02-03 DIAGNOSIS — F419 Anxiety disorder, unspecified: Secondary | ICD-10-CM

## 2021-02-03 DIAGNOSIS — I10 Essential (primary) hypertension: Secondary | ICD-10-CM

## 2021-02-03 MED ORDER — METOPROLOL SUCCINATE ER 25 MG PO TB24: 25.0000 mg | ORAL_TABLET | Freq: Every day | ORAL | 1 refills | Status: DC

## 2021-02-03 NOTE — Patient Instructions (Signed)
Ms. Solazzo,  It was a pleasure speaking with you today.  I have attached a summary of our visit today and information about your health goals.   Our next appointment is by telephone on February 28. 2023 at 9:45am  Please call the care guide team at [phone] if you need to cancel or reschedule your appointment.     If you have any questions or concerns, please feel free to contact me either at the phone number below or with a MyChart message.   Keep up the good work!  [name], PharmD Clinical Pharmacist [facility] [phone] (direct line)  [phone] (main office number)   The patient verbalized understanding of instructions, educational materials, and care plan provided today and declined offer to receive copy of patient instructions, educational materials, and care plan.    Hypertension / Congestive Heart Failure: Blood pressure goal <140/90  CHF goals: prevent exacerbation of CHF and minimize symptoms of CHF  BP Readings from Last 3 Encounters:  01/23/21 (!) 142/84  11/28/20 126/90  09/05/20 134/82    current treatment: [name] 97/103 mg twice a day Metoprolol ER 25mg  daily  Interventions:   Educated on benefits of Entresto and metoprolol for heart health Counseled to monitor weight and heart rate Continue metoprolol ER 25mg  take 1 tablet daily   Hyperlipidemia: Last LDL at goal of <100 but lipids last checked in 2017  Lab Results  Component Value Date   LDLCALC 74 10/30/2015   [place] 158 (H) 09/18/2015   LDLCALC 101 (H) 07/14/2015   current treatment: No current pharmacotherapy   Interventions:  Recommended limit intake of saturated fat and cholesterol Consider rechecking lipid panel with next labs  Depression/sleep disturbance: current treatment: Sertraline 50mg  - take 1.5 tablets = 75mg  daily  Trazodone 50mg  - take 1 or 2 tablets at bedtime Interventions:  Continue current therapy  GERD with Barrett's  Esophagus / abdominal cramps: Controlled Current therapy:  Omeprazole 40mg  twice a day Dicyclomine 20mg  take 1 tablet 4 times a day before meal and at bedtime. .  Interventions:  Recommended continue current therapy.   Health Maintenance:  Discussed getting vaccines - Tdap Patient plans to get Tdap   Patient Goals/Self-Care Activities Over the next 90 days, patient will:  take medications as prescribed   Follow Up Plan: Telephone follow up appointment with care management team member scheduled for:  2 to 3 months.

## 2021-02-03 NOTE — Chronic Care Management (AMB) (Signed)
Chronic Care Management Pharmacy Note  02/03/2021 Name:  [name] MRN:  [id number] DOB:  [birth year]  Summary: Improved adherence to metoprolol and sertraline. Tolerating well.  Cough from earlier in December improved.   Recommendations/Changes made from today's visit: Continue current medication regimen Will have lipids rechecked at appointment with cardiology 02/2021  Subjective: [name] is an 75 y.o. year old female who is a primary patient of Paz, Alda Berthold, MD.  The CCM team was consulted for assistance with disease management and care coordination needs.    Engaged with patient by telephone for follow up visit in response to provider referral for pharmacy case management and/or care coordination services.   Consent to Services:  The patient was given information about Chronic Care Management services, agreed to services, and gave verbal consent prior to initiation of services.  Please see initial visit note for detailed documentation.   Patient Care Team: Colon Branch, MD as PCP - General Jerline Pain, MD as PCP - Cardiology (Cardiology) Bjorn Loser, MD as Consulting Physician (Urology) Maia Breslow, MD as Consulting Physician (Orthopedic Surgery) Alden Hipp, MD as Consulting Physician (Obstetrics and Gynecology) Phylliss Bob, MD as Consulting Physician (Orthopedic Surgery) Red Christians, MD as Referring Physician (Pain Medicine) Ladene Artist, MD as Consulting Physician (Gastroenterology) [name], [place] (Pharmacist)  Recent office visits: 01/23/2021 - Int Med (Dr Larose Kells) Acute Visit for cough. Prescribed benxonatate 271m 3 times a day as needed and hydrocodone / Homatripin cough syrup 5 milliliters up to every 6 hours as needed.  11/28/2020 - Internal Med (Dr PLarose Kells Seen for chronic fatigue.  11/20/2020 - Internal Med (Dr PLarose Kells Video for chronic fatigue. No medication changes 10/11/20-Amy Hopkins, LPN. Seen for Medicare Annual Wellness. AMB  [facility] Follow up in 1 year. 08/21/20-Jose ELadona Horns MD (PCP) General follow up. Labs ordered. Mammogram ordered. Ambulatory referral to Pulmonology. Follow up in 3 months.  Recent consult visits: 09/05/20 (Pulmonology) Dr MConcepcion Living  Seen for follow up for post COVID-19. We will check a blood test called D-dimer. If normal then we can stop the Eliquis CT of chest ordered. Follow up in clinic after CT. 08/28/20 (Gastroenterology) Amy S.Esterwood, PA-C. Seen for abdominal pain. Labs ordered. Start Zofran 4 mg every 6 hours as needed for nausea. Was advised to use Imodium up to 6/day as needed if diarrhea is persisting  Hospital visits: None in previous 6 months  Objective:  Lab Results  Component Value Date   CREATININE 0.87 12/02/2020   CREATININE 0.93 08/28/2020   CREATININE 0.90 08/21/2020    Lab Results  Component Value Date   HGBA1C 5.6 04/15/2020   Last diabetic Eye exam: No results found for: HMDIABEYEEXA  Last diabetic Foot exam: No results found for: HMDIABFOOTEX      Component Value Date/Time   CHOL 148 10/30/2015 1102   TRIG 68.0 10/30/2015 1102   HDL 60.50 10/30/2015 1102   CHOLHDL 2 10/30/2015 1102   VLDL 13.6 10/30/2015 1102   LDLCALC 74 10/30/2015 1102   LDLDIRECT 166.0 04/14/2010 1539    Hepatic Function Latest Ref Rng & Units 08/28/2020 12/07/2019 12/05/2019  Total Protein 6.0 - 8.3 g/dL 7.2 7.3 6.7  Albumin 3.5 - 5.2 g/dL 4.0 3.7 -  AST 0 - 37 U/L '14 19 14  ' ALT 0 - 35 U/L '9 14 10  ' Alk Phosphatase 39 - 117 U/L 82 67 -  Total Bilirubin 0.2 - 1.2 mg/dL 0.6 0.8 0.6  Bilirubin, Direct 0.0 - 0.3 mg/dL - - -    Lab Results  Component Value Date/Time   TSH 1.89 04/15/2020 02:40 PM   TSH 1.62 12/16/2017 02:16 PM    CBC Latest Ref Rng & Units 12/02/2020 08/28/2020 08/21/2020  WBC 3.8 - 10.8 Thousand/uL 6.3 6.2 6.8  Hemoglobin 11.7 - 15.5 g/dL 13.1 13.3 12.8  Hematocrit 35.0 - 45.0 % 39.4 40.7 38.4  Platelets 140 - 400  Thousand/uL 214 212.0 211.0    Lab Results  Component Value Date/Time   VD25OH 44 12/02/2020 07:48 AM   VD25OH 28.12 (L) 04/15/2020 02:40 PM   VD25OH 36 09/30/2010 03:48 PM    Clinical ASCVD: No  The ASCVD Risk score (Arnett DK, et al., 2019) failed to calculate for the following reasons:   Cannot find a previous HDL lab   Cannot find a previous total cholesterol lab    Other:   DEXA 08/15/2019 AP Spine L1-L3 02/15/2019 74.3 Normal 0.4 1.224 g/cm2 AP Spine L1-L3 10/04/2016 72.0 Normal 0.2 1.190 g/cm2   DualFemur Neck Left 02/15/2019 74.3 Osteopenia -1.8 0.787 g/cm2 DualFemur Neck Left 10/04/2016 72.0 Osteopenia -1.4 0.838 g/cm2   DualFemur Total Mean 02/15/2019 74.3 Osteopenia -1.2 0.860 g/cm2 DualFemur Total Mean 10/04/2016 72.0 Normal -0.8 0.910 g/cm2  FRAX* 10-year Probability of Fracture Based on femoral neck BMD: DualFemur (Left)   Major Osteoporotic Fracture: 10.9% Hip Fracture:                2.3% Population:                  [place] (Caucasian) Risk Factors:                None  Social History   Tobacco Use  Smoking Status Former   Packs/day: 0.50   Years: 10.00   Pack years: 5.00   Types: Cigarettes   Quit date: 02/09/1975   Years since quitting: 46.0  Smokeless Tobacco Never  Tobacco Comments   started at age 68.   quit in the 1970s.   BP Readings from Last 3 Encounters:  01/23/21 (!) 142/84  11/28/20 126/90  09/05/20 134/82   Pulse Readings from Last 3 Encounters:  01/23/21 98  11/28/20 65  09/05/20 66   Wt Readings from Last 3 Encounters:  01/23/21 246 lb (111.6 kg)  11/28/20 242 lb 6 oz (109.9 kg)  11/20/20 240 lb (108.9 kg)    Assessment: Review of patient past medical history, allergies, medications, health status, including review of consultants reports, laboratory and other test data, was performed as part of comprehensive evaluation and provision of chronic care management services.   SDOH:  (Social Determinants of Health) assessments and  interventions performed:  SDOH Interventions    Flowsheet Row Most Recent Value  SDOH Interventions   Financial Strain Interventions Intervention Not Indicated       CCM Care Plan  Allergies  Allergen Reactions   Dextromethorphan-Guaifenesin Nausea And Vomiting   Morphine Nausea And Vomiting   Gabapentin Other (See Comments)    Dizziness, made her feel loopy   Penicillins Rash    "> 30 years ago; best I can remember it was just a light rash on my arm" Did it involve swelling of the face/tongue/throat, SOB, or low BP? No Did it involve sudden or severe rash/hives, skin peeling, or any reaction on the inside of your mouth or nose? Unknown Did you need to seek medical attention at a hospital or doctor's office? No When did  it last happen?      more than 30 years  If all above answers are NO, may proceed with cephalosporin use.     Medications Reviewed Today     Reviewed by [name], RPH-CPP (Pharmacist) on 02/03/21 at 1021  Med List Status: <None>   Medication Order Taking? Sig Documenting Provider Last Dose Status Informant  albuterol (VENTOLIN HFA) 108 (90 Base) MCG/ACT inhaler [id number] Yes Inhale 2 puffs into the lungs every 6 (six) hours as needed for wheezing (Chest congestion). Colon Branch, MD Taking Active Multiple Informants  benzonatate (TESSALON) 200 MG capsule [id number] Yes Take 1 capsule (200 mg total) by mouth 3 (three) times daily as needed for cough. Colon Branch, MD Taking Active   Cholecalciferol (VITAMIN D) 50 MCG (2000 UT) CAPS [id number] Yes Take 4,000 Units by mouth daily. [provider] Taking Active Multiple Informants  Cyanocobalamin (VITAMIN B12) 500 MCG TABS [id number] Yes Take 1 tablet by mouth daily. [provider] Taking Active   dicyclomine (BENTYL) 20 MG tablet [id number] Yes Take 1 tablet (20 mg total) by mouth 4 (four) times daily -  before meals and at bedtime. Ladene Artist, MD Taking Active            Med Note [facility],  [place] Nov 20, 2020  9:35 AM) Forgets often  [employer] [place] [address] [place] [id number] Yes TAKE 1 TABLET TWICE A DAY Jerline Pain, MD Taking Active   HYDROcodone bit-homatropine [facility]) 5-1.5 MG/5ML syrup [id number] Yes Take 5 mLs by mouth every 6 (six) hours as needed for cough. Colon Branch, MD Taking Active   HYDROcodone-acetaminophen [facility]) 10-325 MG tablet [id number] Yes Take 1 tablet by mouth every 6 (six) hours as needed. [provider] Taking Active   loperamide (IMODIUM) 2 MG capsule [id number] Yes Take by mouth as needed for diarrhea or loose stools. [provider] Taking Active   metoprolol succinate (TOPROL-XL) 25 MG 24 hr tablet [id number] Yes Take 25 mg by mouth daily. [provider] Taking Active   NARCAN 4 MG/0.1ML LIQD nasal spray kit [id number] Yes Place 1 spray into the nose once as needed (opioid od). [provider] Taking Active            Med Note [facility], [place]   Thu Jan 17, 2020  1:27 PM)    omeprazole (PRILOSEC) 40 MG capsule [id number] Yes Take 1 capsule (40 mg total) by mouth 2 (two) times daily. Ladene Artist, MD Taking Active   ondansetron [facility]) 4 MG tablet [id number] Yes Take 1 tablet by mouth 30 minutes ac meals and hs x 3 days then PRN Ladene Artist, MD Taking Active   pregabalin (LYRICA) 75 MG capsule [id number] Yes Take 75 mg by mouth 3 (three) times daily. [provider] Taking Active   promethazine (PHENERGAN) 25 MG tablet [id number] Yes Take 25 mg by mouth every 6 (six) hours as needed for nausea or vomiting. [provider] Taking Active   rOPINIRole (REQUIP) 1 MG tablet [id number] Yes TAKE 1 TABLET BY MOUTH TWICE A DAY  Patient taking differently: Take 1 mg by mouth at bedtime. May take another tablet during night if she wakes up   Colon Branch, MD Taking Active   sertraline (ZOLOFT) 50 MG tablet [id number] Yes Take 1.5 tablets (75 mg total) by mouth daily. Colon Branch, MD Taking Active    traZODone (DESYREL) 50 MG tablet [id number] Yes TAKE 1-2 TABLETS  BY MOUTH AT BEDTIME. Colon Branch, MD Taking Active             Patient Active Problem List   Diagnosis Date Noted   Pulmonary embolism ([place]) 12/07/2019   Gastric polyps    Fibromyalgia, see office visit note from 02/14/2018 02/15/2018   Paresthesia 12/29/2017   Gait abnormality 09/21/2017   Dizziness 09/21/2017   Raynaud phenomenon 07/22/2017   Nonischemic cardiomyopathy ([place]) 05/18/2017   Chronic combined systolic and diastolic CHF (congestive heart failure) ([place]) 05/05/2017   Headache 05/05/2017   Chest pain 05/04/2017   Insomnia 10/23/2016   Morbid obesity ([place]) 12/16/2015   Myalgia 07/10/2015   Hyperlipidemia 07/10/2015   PCP NOTES >>>>>>>>>>>>>>>>> 10/31/2014   Anxiety and depression 09/10/2013   Vitamin B 12 deficiency 04/09/2013   Chronic cystitis 05/29/2012   Annual physical exam 06/14/2011   PARESTHESIA 04/01/2009   DJD (degenerative joint disease) 10/21/2008   BARRETTS ESOPHAGUS 07/19/2007   RESTLESS LEGS SYNDROME 05/11/2007   Migraine-- on topamax, rx by welness center 07/27/2006   Hypertension 07/27/2006   Osteopenia 07/27/2006   Chronic fatigue 07/27/2006    Immunization History  Administered Date(s) Administered   Fluad Quad(high Dose 65+) 10/31/2018   Influenza Split 02/17/2011   Influenza Whole 01/14/2010   Influenza, High Dose Seasonal PF 01/24/2015, 12/16/2017   Influenza,inj,Quad PF,6+ Mos 10/22/2013   Influenza-Unspecified 03/13/2016, 10/20/2016   Pneumococcal Conjugate-13 09/10/2013   Pneumococcal Polysaccharide-23 04/14/2010, 08/21/2020   Td 02/08/1998, 04/14/2010   Zoster, Live 12/06/2013    Conditions to be addressed/monitored: CHF, HTN, HLD, Depression, and history of PE - related to [place]; osteopenia; GERD with Barrett's esophagus; neuropathy; fibromylagia  Care Plan : General Pharmacy (Adult)  Updates made by [name], RPH-CPP since 02/03/2021 12:00 AM      Problem: Chronic Conditions: Heart failure, hypertension, Irritable bowel syndrome; Restless leg syndrome, depression, insomnia, osteopenia, chronic pain, fibromyalgia, GERD with Barrett's esophagus   Priority: High  Onset Date: 10/21/2020     Goal: Medication and Chronic Care Management with improve patient knowledge about disease states, treatment and goals and improved adhernece   Start Date: 10/21/2020  Priority: High  Note:   Current Barriers:  Unable to independently monitor therapeutic efficacy Does not adhere to prescribed medication regimen (improving)   Pharmacist Clinical Goal(s):  Over the next 90 days, patient will achieve adherence to monitoring guidelines and medication adherence to achieve therapeutic efficacy maintain control of hypertension, acid reflux and mood as evidenced by maintaining goals listed below  adhere to prescribed medication regimen as evidenced by refill history  through collaboration with PharmD and provider.   Interventions: 1:1 collaboration with Colon Branch, MD regarding development and update of comprehensive plan of care as evidenced by provider attestation and co-signature Inter-disciplinary care team collaboration (see longitudinal plan of care) Comprehensive medication review performed; medication list updated in electronic medical record  Hypertension / Congestive Heart Failure: Last blood pressure was slightly elevated (taken during acute illness) Blood pressure goal <140/90  CHF goals: prevent exacerbation of CHF and minimize symptoms of CHF Managed by cardiologist Dr Marlou Porch  HF with improved EF Noted to have systolic-diastolic heart failure / non ischemic cardiomyopathy EF was 35% initially; Most recent EF was 60-65% on 12/08/2019  BP Readings from Last 3 Encounters:  01/23/21 (!) 142/84  11/28/20 126/90  09/05/20 134/82  current treatment: [name] 97/103 mg twice a day Metoprolol ER 7m - take 0.5 tablet = 259mdaily   10/21/2020:Refill record show that metoprolol has  not been filled since 12/07/2019. Patient is not sure why she is not taking. Unable to find reference to stop in EMR.  12/16/2020: patient has restarted metoprolol. Tolerating well Current home blood pressure readings: not currently checking blood pressure at home. Patient report she has bought 2 blood pressure cuffs in past and both broke so she does not want to purchase another Denies hypotensive or hypertensive symptoms Interventions:   Educated on benefits of Entresto and metoprolol for heart health Counseled on monitor weight and heart rate Follow up with cardiologist as planned 02/2021 (has to cancel 01/2021 appointment due to illness)   Hyperlipidemia: Last LDL form 2017 was <100 but recent CT of lung noted aortic athlesclerolsis on 11/17/2020. Could consider LDL goal of <70 Lab Results  Component Value Date   LDLCALC 74 10/30/2015   LDLCALC 158 (H) 09/18/2015   LDLCALC 101 (H) 07/14/2015  current treatment: No current pharmacotherapy   Medications previously tried: pravastatin, rosuvastatin (both noted to cause myalgias); ezetimibe tried in 2018 - caused joint pain  Interventions:  Recommended limit intake of saturated fat and cholesterol Looks like notes for next cardiology appt mentions rechecking lipids - appt 02/2021 Discussed non statin options for treating cholesterol / lower CVD risk. Patient is open to consider. Reviewed her Gu[place]upe is tier 2 but requires prior authorization (estimated cost $50/30 days) Nexletol is tier 3 but requires prior authorization (estimated cost 60% of med cost)  Depression/sleep disturbance: Controlled;  current treatment: Sertraline 35m - take 1.5 tablets = 787mdaily Trazodone 5077m take 1 or 2 tablets at bedtime Past medications tried: escitalopram / Lexapro in 2015; Effexor At last visit patient was only taking sertraline 1 tablet daily; has since  restarted taking at prescribed dose.  Interventions:  Recommended continue sertraline 1.5 tablets daily. Continue current therapy  GERD with Barrett's Esophagus / abdominal cramps: Controlled Current therapy:  Omeprazole 75m23mice a day Dicyclomine 20mg71me 1 tablet 4 times a day before meal and at bedtime.  Patient reports rare reflux symptoms since starting omeprazole. Usually occurs when eating [place]. Blandingrely has abdominal cramping.  Interventions:  Recommended continue current therapy.   Health Maintenance:  Discussed getting vaccines - Tdap, annual flu vaccine, COVID vaccine.  Patient declines to get COVID or flu vaccine.  Considering Tdap  Patient Goals/Self-Care Activities Over the next 90 days, patient will:  take medications as prescribed  Follow Up Plan: Telephone follow up appointment with care management team member scheduled for:  2 to 3 months         Medication Assistance: None required.  Patient affirms current coverage meets needs.  Patient's preferred pharmacy is:  [id number], [place] [id number]: [phone] OLyonsgistered Caremark [address] [address]: [phone] Up:  Patient agrees to Care Plan and Follow-up.  Plan: Telephone follow up appointment with care management team member scheduled for:  2 to 3 months  Tehila Sokolow Cherre RobinsmD Clinical Pharmacist [facility] Heights[facility]

## 2021-02-03 NOTE — Progress Notes (Signed)
I have personally reviewed this encounter including the documentation in this note and have collaborated with the care management provider regarding care management and care coordination activities to include development and update of the comprehensive care plan. I am certifying that I agree with the content of this note and encounter as supervising physician.  Kathlene November, MD

## 2021-02-04 ENCOUNTER — Telehealth: Payer: Medicare Other

## 2021-02-07 DIAGNOSIS — I110 Hypertensive heart disease with heart failure: Secondary | ICD-10-CM | POA: Diagnosis not present

## 2021-02-07 DIAGNOSIS — F32A Depression, unspecified: Secondary | ICD-10-CM | POA: Diagnosis not present

## 2021-02-07 DIAGNOSIS — E782 Mixed hyperlipidemia: Secondary | ICD-10-CM | POA: Diagnosis not present

## 2021-02-07 DIAGNOSIS — I5042 Chronic combined systolic (congestive) and diastolic (congestive) heart failure: Secondary | ICD-10-CM | POA: Diagnosis not present

## 2021-02-08 ENCOUNTER — Encounter (HOSPITAL_BASED_OUTPATIENT_CLINIC_OR_DEPARTMENT_OTHER): Payer: Self-pay | Admitting: Urology

## 2021-02-08 ENCOUNTER — Emergency Department (HOSPITAL_BASED_OUTPATIENT_CLINIC_OR_DEPARTMENT_OTHER)
Admission: EM | Admit: 2021-02-08 | Discharge: 2021-02-08 | Disposition: A | Discharge status: Home / Self Care | Payer: Medicare Other | Attending: Emergency Medicine | Admitting: Emergency Medicine

## 2021-02-08 ENCOUNTER — Other Ambulatory Visit: Payer: Self-pay

## 2021-02-08 ENCOUNTER — Emergency Department (HOSPITAL_BASED_OUTPATIENT_CLINIC_OR_DEPARTMENT_OTHER): Payer: Medicare Other

## 2021-02-08 DIAGNOSIS — R519 Headache, unspecified: Secondary | ICD-10-CM | POA: Diagnosis not present

## 2021-02-08 DIAGNOSIS — W208XXA Other cause of strike by thrown, projected or falling object, initial encounter: Secondary | ICD-10-CM | POA: Diagnosis not present

## 2021-02-08 DIAGNOSIS — Z79899 Other long term (current) drug therapy: Secondary | ICD-10-CM | POA: Diagnosis not present

## 2021-02-08 DIAGNOSIS — S0990XA Unspecified injury of head, initial encounter: Secondary | ICD-10-CM

## 2021-02-08 DIAGNOSIS — S0101XA Laceration without foreign body of scalp, initial encounter: Secondary | ICD-10-CM | POA: Diagnosis not present

## 2021-02-08 MED ORDER — DIPHENHYDRAMINE HCL 25 MG PO CAPS
25.0000 mg | ORAL_CAPSULE | Freq: Once | ORAL | Status: AC
  Administered 2021-02-08: 25 mg via ORAL
  Filled 2021-02-08: qty 1

## 2021-02-08 MED ORDER — METOCLOPRAMIDE HCL 10 MG PO TABS: 10.0000 mg | ORAL_TABLET | Freq: Four times a day (QID) | ORAL | 0 refills | Status: DC

## 2021-02-08 MED ORDER — DIPHENHYDRAMINE HCL 25 MG PO TABS: 25.0000 mg | ORAL_TABLET | Freq: Four times a day (QID) | ORAL | 0 refills | Status: DC | PRN

## 2021-02-08 MED ORDER — METOCLOPRAMIDE HCL 10 MG PO TABS
10.0000 mg | ORAL_TABLET | Freq: Once | ORAL | Status: AC
Start: 2021-02-08 — End: 2021-02-08
  Administered 2021-02-08: 10 mg via ORAL
  Filled 2021-02-08: qty 1

## 2021-02-08 NOTE — ED Provider Notes (Signed)
[facility] EMERGENCY DEPARTMENT Provider Note   CSN: [id number] Arrival date & time: 02/08/21  1345     History  Chief Complaint  Patient presents with   Head Injury    [name] is a 76 y.o. female.   Head Injury Associated symptoms: headache and nausea   Associated symptoms: no vomiting   Patient is a 76 year old female with past medical history notable for migraines, HTN, osteoporosis, chronic fatigue, paresthesias, restless leg syndrome, PE no longer on anticoagulation, morbid obesity, insomnia, headaches, CHF  Patient is presented emergency room today with complaints of headache, nausea without vomiting states that yesterday around 8:30 PM a bronze angel statue approximately 8 inches tall fell onto her head.  She states that she had a cut on the top of her head from this that bled profusely but she states that she was able to hold pressure on it and it stopped relatively quickly.  She has not been on any anticoagulation in the past 3 months she was on a DOAC anticoagulation for PE historical but was stopped in September or October.  States that she did not had a sudden onset headache or any headache yesterday evening but did wake up with headache and some mild nausea this morning.  Has not had any slurred speech confusion vision changes numbness weakness or vomiting.   She describes the headache as mild has not taken any medications for her headache and states that it seems to be more on the crown of her head.  This is where the object hit the top of her head.    Home Medications Prior to Admission medications   Medication Sig Start Date End Date Taking? Authorizing Provider  albuterol (VENTOLIN HFA) 108 (90 Base) MCG/ACT inhaler Inhale 2 puffs into the lungs every 6 (six) hours as needed for wheezing (Chest congestion). 11/27/19   Colon Branch, MD  benzonatate (TESSALON) 200 MG capsule Take 1 capsule (200 mg total) by mouth 3 (three) times daily as needed for  cough. 01/23/21   Colon Branch, MD  Cholecalciferol (VITAMIN D) 50 MCG (2000 UT) CAPS Take 4,000 Units by mouth daily.    [provider]  Cyanocobalamin (VITAMIN B12) 500 MCG TABS Take 1 tablet by mouth daily.    [provider]  dicyclomine (BENTYL) 20 MG tablet Take 1 tablet (20 mg total) by mouth 4 (four) times daily -  before meals and at bedtime. 06/24/20   Ladene Artist, MD  ENTRESTO 97-103 MG TAKE 1 TABLET TWICE A DAY 12/22/20   Jerline Pain, MD  HYDROcodone bit-homatropine (HYCODAN) 5-1.5 MG/5ML syrup Take 5 mLs by mouth every 6 (six) hours as needed for cough. 01/23/21   Colon Branch, MD  HYDROcodone-acetaminophen [facility]) 10-325 MG tablet Take 1 tablet by mouth every 6 (six) hours as needed. 08/15/20   [provider]  loperamide (IMODIUM) 2 MG capsule Take by mouth as needed for diarrhea or loose stools.    [provider]  metoprolol succinate (TOPROL-XL) 25 MG 24 hr tablet Take 1 tablet (25 mg total) by mouth daily. 02/03/21   Colon Branch, MD  [facility] 4 MG/0.1ML LIQD nasal spray kit Place 1 spray into the nose once as needed (opioid od). 04/24/18   [provider]  omeprazole (PRILOSEC) 40 MG capsule Take 1 capsule (40 mg total) by mouth 2 (two) times daily. 06/24/20   Ladene Artist, MD  ondansetron (ZOFRAN) 4 MG tablet Take 1 tablet  by mouth 30 minutes ac meals and hs x 3 days then PRN 09/11/20   Ladene Artist, MD  pregabalin (LYRICA) 75 MG capsule Take 75 mg by mouth 3 (three) times daily. 08/15/20   [provider]  promethazine (PHENERGAN) 25 MG tablet Take 25 mg by mouth every 6 (six) hours as needed for nausea or vomiting.    [provider]  rOPINIRole (REQUIP) 1 MG tablet TAKE 1 TABLET BY MOUTH TWICE A DAY Patient taking differently: Take 1 mg by mouth at bedtime. May take another tablet during night if she wakes up 10/06/20   Colon Branch, MD  sertraline (ZOLOFT) 50 MG tablet Take 1.5 tablets (75 mg total) by mouth  daily. 11/11/20   Colon Branch, MD  traZODone (DESYREL) 50 MG tablet TAKE 1-2 TABLETS BY MOUTH AT BEDTIME. 12/19/20   Colon Branch, MD      Allergies    Dextromethorphan-guaifenesin, Morphine, Gabapentin, and Penicillins    Review of Systems   Review of Systems  Constitutional:  Positive for fatigue. Negative for chills and fever.  HENT:  Negative for congestion.   Eyes:  Negative for pain.  Respiratory:  Negative for cough and shortness of breath.   Cardiovascular:  Negative for chest pain and leg swelling.  Gastrointestinal:  Positive for nausea. Negative for abdominal pain and vomiting.  Genitourinary:  Negative for dysuria.  Musculoskeletal:  Negative for myalgias.  Skin:  Negative for rash.  Neurological:  Positive for headaches. Negative for dizziness.       Headaches   Physical Exam Updated Vital Signs BP (!) 148/86 (BP Location: Right Arm)    Pulse 77    Temp 98.2 F (36.8 C) (Oral)    Resp 20    Ht '5\' 6"'  (1.676 m)    Wt 110.7 kg    SpO2 96%    BMI 39.38 kg/m  Physical Exam Vitals and nursing note reviewed.  Constitutional:      General: She is not in acute distress. HENT:     Head: Normocephalic and atraumatic.     Nose: Nose normal.  Eyes:     General: No scleral icterus. Cardiovascular:     Rate and Rhythm: Normal rate and regular rhythm.     Pulses: Normal pulses.     Heart sounds: Normal heart sounds.  Pulmonary:     Effort: Pulmonary effort is normal. No respiratory distress.     Breath sounds: No wheezing.  Abdominal:     Palpations: Abdomen is soft.     Tenderness: There is no abdominal tenderness.  Musculoskeletal:     Cervical back: Normal range of motion.     Right lower leg: No edema.     Left lower leg: No edema.  Skin:    General: Skin is warm and dry.     Capillary Refill: Capillary refill takes less than 2 seconds.  Neurological:     Mental Status: She is alert. Mental status is at baseline.     Comments: Alert and oriented to self, place,  time and event.   Speech is fluent, clear without dysarthria or dysphasia.   Strength 5/5 in upper/lower extremities   Sensation intact in upper/lower extremities   Normal gait.  CN I not tested  CN II grossly intact visual fields bilaterally. Did not visualize posterior eye.  CN III, IV, VI PERRLA and EOMs intact bilaterally  CN V Intact sensation to sharp and light touch to the face  CN VII facial movements symmetric  CN VIII not tested  CN IX, X no uvula deviation, symmetric rise of soft palate  CN XI 5/5 SCM and trapezius strength bilaterally  CN XII Midline tongue protrusion, symmetric L/R movements   Psychiatric:        Mood and Affect: Mood normal.        Behavior: Behavior normal.    ED Results / Procedures / Treatments   Labs (all labs ordered are listed, but only abnormal results are displayed) Labs Reviewed - No data to display  EKG None  Radiology CT Head Wo Contrast  Result Date: 02/08/2021 CLINICAL DATA:  Head injury yesterday. Not hit in the head with an object while putting of Christmas decor. Some headache. EXAM: CT HEAD WITHOUT CONTRAST TECHNIQUE: Contiguous axial images were obtained from the base of the skull through the vertex without intravenous contrast. COMPARISON:  09/13/2019. FINDINGS: Brain: No evidence of acute infarction, hemorrhage, hydrocephalus, extra-axial collection or mass lesion/mass effect. Vascular: No hyperdense vessel or unexpected calcification. Skull: Normal. Negative for fracture or focal lesion. Sinuses/Orbits: Globes and orbits are unremarkable. Visualized sinuses are clear. Other: None. IMPRESSION: Normal unenhanced CT scan of the brain. Electronically Signed   By: Lajean Manes M.D.   On: 02/08/2021 14:43    Procedures Procedures   Medications Ordered in ED Medications  metoCLOPramide (REGLAN) tablet 10 mg (10 mg Oral Given 02/08/21 1420)  diphenhydrAMINE (BENADRYL) capsule 25 mg (25 mg Oral Given 02/08/21 1420)    ED Course/  Medical Decision Making/ A&P  Patient is a 76 year old woman presented emergency room today with some nausea no emesis she is presented emergency room after being struck in the head approximately 8:30 PM last night with a bronze statue estimated to be 1 to 2 pounds.  No vomiting currently and mild headache seems to be painful to crown of her head she has a small punctate wound here on the crown of the head.  Is not bleeding.  Neurologically intact on examination has a normal trauma exam.  Did not fall or syncopize after being hit in the head.    CT head unremarkable IMPRESSION:  Normal unenhanced CT scan of the brain.   Obtained head CT which is unremarkable and provided Reglan Benadryl.  Some improvement in her symptoms will discharge home with Tylenol recommendations and Reglan Benadryl to use at home for headache.  Return precautions given.  Suspect mild traumatic brain injury such as mild concussion.  Final Clinical Impression(s) / ED Diagnoses Final diagnoses:  Injury of head, initial encounter  Laceration of scalp, initial encounter    Rx / DC Orders ED Discharge Orders     None         Tedd Sias, [place] 02/08/21 1616    Hayden Rasmussen, MD 02/08/21 [phone]

## 2021-02-08 NOTE — ED Notes (Signed)
ED Provider at bedside. 

## 2021-02-08 NOTE — Discharge Instructions (Addendum)
Please take Tylenol 1000 mg every 6 hours for pain.  I have also prescribed you Reglan which is a nausea medicine you may take this with Benadryl 25 mg when you use it.  This will help with nausea and headache.  Drink plenty of water and follow-up with your primary care provider.

## 2021-02-08 NOTE — ED Triage Notes (Signed)
Got hit in head with Bronze Mermaid last night while putting up christmas decor Denies LOC,  Not on Blood thinners Small abrasion to scalp, bleeding controlled This morning is nauseated and had HA

## 2021-02-19 DIAGNOSIS — I779 Disorder of arteries and arterioles, unspecified: Secondary | ICD-10-CM | POA: Insufficient documentation

## 2021-02-19 NOTE — Progress Notes (Addendum)
Cardiology Office Note:    Date:  02/20/2021   ID:  [name], DOB [birth year], MRN [id number]  PCP:  Colon Branch, MD  [facility] Providers Cardiologist:  Candee Furbish, MD    Referring MD: Colon Branch, MD   Chief Complaint:  Follow-up for CHF, Shortness of Breath, and Chest Pain    Patient Profile: HFimpEF (heart failure with improved ejection fraction)  Prior EF 35 >> Improved to normal Non-ischemic cardiomyopathy  Cath in 2019: no CAD Carotid artery disease Hypertension  Hyperlipidemia  Hx of pulmonary embolism 11/2019 in setting of COVID-19 GERD; Barrett's Esophagus  Aortic atherosclerosis  Prior CV studies Echocardiogram 12/19/2020 EF 55-60, no RWMA, GR 1 DD, normal RVSF, RVSP 34.4, trivial MR, mild AI  Echo 12/08/2019 EF 60-65  Carotid US 09/29/2017 Bilateral ICA 1-39  Cardiac catheterization 05/06/2017 No CAD EF 30-35  History of Present Illness:   [name] is a 76 y.o. female with the above problem list.  She was last seen by Dr. Marlou Porch via telemedicine in 12/21.  She had CT scan with pulmonology in October that demonstrated an enlarged heart.  She had a follow-up echocardiogram which demonstrated normal EF.    She returns for follow-up.  She is here alone.  She notes symptoms of chest pain and shortness of breath.  These symptoms seem to be fairly chronic.  However, she has noted some worsening over the past 2 months.  She has significant back issues and had to go on disability some years ago.  She walks with a cane.  Her CT scan in October showed some scattered pulmonary parenchymal scarring.  She notes that she uses an albuterol inhaler to improve her symptoms.  She describes substernal chest heaviness when she gets short of breath.  She gets symptoms with mild to moderate activity.  She feels more comfortable sleeping on an incline.  She does have some leg edema that seems to improve with elevation.  She has not had syncope.  Her chest discomfort is not  associated with radiation, nausea or diaphoresis.        Past Medical History:  Diagnosis Date   Allergy    Anemia    Anxiety    Barrett's esophagus    Bilateral carpal tunnel syndrome    Dr. Eddie Dibbles, having injection therapy   C. difficile diarrhea 08/01/2012   severe 2014   Carotid arterial disease ([place])    Cataract    removed both eyes    Chronic combined systolic and diastolic CHF (congestive heart failure) ([place])    Chronic cystitis    Dr. Matilde Sprang   Chronic lower back pain    Chronic pain syndrome    Depression    Dizziness    DJD (degenerative joint disease)    bilateral hands; knees   Family history of anesthesia complication    Mother had severe N/V   Fibromyalgia    GERD (gastroesophageal reflux disease)    H/O cardiac catheterization    (-) cath 12-2002  , cath again 2011 (-)   History of colon polyps    HTN (hypertension)    Hyperlipidemia    past hx    IBS (irritable bowel syndrome)    Insomnia    Migraine    Morbid obesity ([place])    Non-ischemic cardiomyopathy ([place])    Osteoporosis    pt unsure of this   RLS (restless legs syndrome)    Vertigo    Vitamin B 12 deficiency  04/09/2013   Current Medications: Current Meds  Medication Sig   albuterol (VENTOLIN HFA) 108 (90 Base) MCG/ACT inhaler Inhale 2 puffs into the lungs every 6 (six) hours as needed for wheezing (Chest congestion).   benzonatate (TESSALON) 200 MG capsule Take 1 capsule (200 mg total) by mouth 3 (three) times daily as needed for cough.   Cholecalciferol (VITAMIN D) 50 MCG (2000 UT) CAPS Take 4,000 Units by mouth daily.   Cyanocobalamin (VITAMIN B12) 500 MCG TABS Take 1 tablet by mouth daily.   dicyclomine (BENTYL) 20 MG tablet Take 1 tablet (20 mg total) by mouth 4 (four) times daily -  before meals and at bedtime.   diphenhydrAMINE (BENADRYL) 25 MG tablet Take 1 tablet (25 mg total) by mouth every 6 (six) hours as needed.   ENTRESTO 97-103 MG TAKE 1 TABLET TWICE A DAY   HYDROcodone  bit-homatropine (HYCODAN) 5-1.5 MG/5ML syrup Take 5 mLs by mouth every 6 (six) hours as needed for cough.   HYDROcodone-acetaminophen (NORCO) 10-325 MG tablet Take 1 tablet by mouth every 6 (six) hours as needed.   loperamide (IMODIUM) 2 MG capsule Patient takes 2-4 tablets by mouth as needed for diarrhea or loose stools   metoCLOPramide (REGLAN) 10 MG tablet Take 1 tablet (10 mg total) by mouth every 6 (six) hours.   metoprolol succinate (TOPROL-XL) 50 MG 24 hr tablet Take 1 tablet (50 mg total) by mouth daily. Take with or immediately following a meal.   NARCAN 4 MG/0.1ML LIQD nasal spray kit Place 1 spray into the nose once as needed (opioid od).   nitroGLYCERIN (NITROSTAT) 0.4 MG SL tablet Place 1 tablet (0.4 mg total) under the tongue every 5 (five) minutes as needed for chest pain.   omeprazole (PRILOSEC) 40 MG capsule Take 1 capsule (40 mg total) by mouth 2 (two) times daily.   ondansetron (ZOFRAN) 4 MG tablet Take 1 tablet by mouth 30 minutes ac meals and hs x 3 days then PRN   pregabalin (LYRICA) 75 MG capsule Take 75 mg by mouth 3 (three) times daily.   promethazine (PHENERGAN) 25 MG tablet Take 25 mg by mouth every 6 (six) hours as needed for nausea or vomiting.   Propylene Glycol 0.6 % SOLN Apply 2 drops to eye as needed (for dry eyes).   rOPINIRole (REQUIP) 1 MG tablet TAKE 1 TABLET BY MOUTH TWICE A DAY   sertraline (ZOLOFT) 50 MG tablet Take 1.5 tablets (75 mg total) by mouth daily.   traZODone (DESYREL) 50 MG tablet TAKE 1-2 TABLETS BY MOUTH AT BEDTIME.   [DISCONTINUED] metoprolol succinate (TOPROL-XL) 25 MG 24 hr tablet Take 1 tablet (25 mg total) by mouth daily.    Allergies:   Dextromethorphan-guaifenesin, Morphine, Gabapentin, and Penicillins   Social History   Tobacco Use   Smoking status: Former    Packs/day: 0.50    Years: 10.00    Pack years: 5.00    Types: Cigarettes    Quit date: 02/09/1975    Years since quitting: 46.0   Smokeless tobacco: Never   Tobacco  comments:    started at age 6.   quit in the 58s.  Vaping Use   Vaping Use: Never used  Substance Use Topics   Alcohol use: Not Currently    Alcohol/week: 0.0 standard drinks    Comment: 2 glasses of wine per year   Drug use: No    Comment: CBD and hemp OIL     Family Hx: The patient's family history  includes Breast cancer in her sister; CAD in her father; Colon cancer in her maternal grandfather and maternal uncle; Congestive Heart Failure in her father; Dementia in her father; Diabetes in an other family member; Esophageal cancer in her brother; Hepatitis C in her brother; Lung cancer in her mother; Stroke in her father. There is no history of Rectal cancer, Stomach cancer, or Colon polyps.  Review of Systems  Gastrointestinal:  Positive for diarrhea. Negative for hematochezia and melena.  Genitourinary:  Negative for hematuria.    EKGs/Labs/Other Test Reviewed:    EKG:  EKG is  ordered today.  The ekg ordered today demonstrates NSR, HR 66, right bundle branch block, nonspecific ST-T wave changes, QTC 480, no significant change compared to prior tracing  Recent Labs: 04/15/2020: TSH 1.89 08/28/2020: ALT 9 12/02/2020: BUN 15; Creat 0.87; Hemoglobin 13.1; Platelets 214; Potassium 4.2; Sodium 141   Recent Lipid Panel No results for input(s): CHOL, TRIG, HDL, VLDL, LDLCALC, LDLDIRECT in the last 8760 hours.   Risk Assessment/Calculations:         Physical Exam:    VS:  BP (!) 142/74    Pulse 66    Ht '5\' 6"'  (1.676 m)    Wt 245 lb 3.2 oz (111.2 kg)    SpO2 95%    BMI 39.58 kg/m     Wt Readings from Last 3 Encounters:  02/20/21 245 lb 3.2 oz (111.2 kg)  02/08/21 244 lb (110.7 kg)  01/23/21 246 lb (111.6 kg)    Constitutional:      Appearance: Healthy appearance. Not in distress.  Neck:     Vascular: No JVR. JVD normal.  Pulmonary:     Effort: Pulmonary effort is normal.     Breath sounds: No wheezing. No rales.  Cardiovascular:     Normal rate. Regular rhythm. Normal S1.  Normal S2.      Murmurs: There is no murmur.  Edema:    Peripheral edema present.    Pretibial: bilateral trace edema of the pretibial area. Abdominal:     Palpations: Abdomen is soft.  Skin:    General: Skin is warm and dry.  Neurological:     Mental Status: Alert and oriented to person, place and time.     Cranial Nerves: Cranial nerves are intact.        ASSESSMENT & PLAN:   Chest pain She notes symptoms of exertional shortness of breath and associated chest heaviness.  These have been fairly chronic symptoms but have worsened over the past couple of months.  She has a prior history of COVID with associated pulmonary embolism.  She saw Dr. Vaughan Browner with pulmonology in July.  She did not require long-term anticoagulation.  Her apixaban was stopped at that visit.  Her D-dimer was negative.  High-resolution CT did demonstrate some parenchymal scarring.  She had no suspicious lung nodules.  She had a heart catheterization in 2019 that demonstrated no CAD.  Her recent CT did demonstrate coronary artery calcification.  Her echocardiogram in 11/22 demonstrated normal LV function with mild diastolic dysfunction.  Her symptoms are certainly concerning for angina.  She also has relief with inhaled medications.  Question if her symptoms are related to asthma/COPD.  Overall, it is likely that her symptoms are multifactorial and related to obesity, deconditioning, disability from her back and chronic CHF.  She does not seem to have signs of volume excess on exam today. Arrange Lexiscan Myoview to rule out ischemia Obtain CMET, CBC, BNP Rx for  as needed nitroglycerin Start furosemide if BNP significantly elevated If cardiac work-up negative, follow-up with primary care given response to bronchodilators Follow-up 2-3 months  Hyperlipidemia Given coronary artery calcification on CT scan, obtain fasting lipids today.  If LDL over 70, start statin therapy.  Hypertension Blood pressure somewhat elevated.   Continue Entresto 97/103 mg twice daily.  Increase metoprolol succinate to 50 mg daily.  (HFimpEF) heart failure with improved EF EF has been as low as 35.  This is improved to normal.  Her most recent echocardiogram in November 2022 demonstrated EF 55-60.  Her volume status appears stable.  She does describe NYHA III symptoms.  Obtain BNP today.  If her BNP is significantly elevated, I will place her on furosemide.  Continue Entresto 97/103 mg twice daily.  Continue beta-blocker therapy with metoprolol succinate at increased dose of 50 mg daily as outlined.        Shared Decision Making/Informed Consent The risks [chest pain, shortness of breath, cardiac arrhythmias, dizziness, blood pressure fluctuations, myocardial infarction, stroke/transient ischemic attack, nausea, vomiting, allergic reaction, radiation exposure, metallic taste sensation and life-threatening complications (estimated to be 1 in 10,000)], benefits (risk stratification, diagnosing coronary artery disease, treatment guidance) and alternatives of a nuclear stress test were discussed in detail with [name] and she agrees to proceed.  Dispo:  Return in about 3 months (around 05/21/2021) for Routine Follow Up with Dr. Marlou Porch, or Richardson Dopp, PA-C.   Medication Adjustments/Labs and Tests Ordered: Current medicines are reviewed at length with the patient today.  Concerns regarding medicines are outlined above.  Tests Ordered: Orders Placed This Encounter  Procedures   Comp Met (CMET)   Lipid panel   Pro b natriuretic peptide (BNP)   CBC   Cardiac Stress Test: Informed Consent Details: Physician/Practitioner Attestation; Transcribe to consent form and obtain patient signature   MYOCARDIAL PERFUSION IMAGING   EKG 12-Lead   Medication Changes: Meds ordered this encounter  Medications   nitroGLYCERIN (NITROSTAT) 0.4 MG SL tablet    Sig: Place 1 tablet (0.4 mg total) under the tongue every 5 (five) minutes as needed for chest  pain.    Dispense:  90 tablet    Refill:  3   metoprolol succinate (TOPROL-XL) 50 MG 24 hr tablet    Sig: Take 1 tablet (50 mg total) by mouth daily. Take with or immediately following a meal.    Dispense:  90 tablet    Refill:  3   Signed, Richardson Dopp, PA-C  02/20/2021 12:20 PM    [facility] [address], [place], [place]  [address] Phone: [phone]; Fax: [phone]

## 2021-02-20 ENCOUNTER — Other Ambulatory Visit: Payer: Self-pay

## 2021-02-20 ENCOUNTER — Encounter: Payer: Self-pay | Admitting: Physician Assistant

## 2021-02-20 ENCOUNTER — Ambulatory Visit (INDEPENDENT_AMBULATORY_CARE_PROVIDER_SITE_OTHER): Payer: Medicare Other | Admitting: Physician Assistant

## 2021-02-20 VITALS — BP 142/74 | HR 66 | Ht 66.0 in | Wt 245.2 lb

## 2021-02-20 DIAGNOSIS — I6523 Occlusion and stenosis of bilateral carotid arteries: Secondary | ICD-10-CM | POA: Diagnosis not present

## 2021-02-20 DIAGNOSIS — E782 Mixed hyperlipidemia: Secondary | ICD-10-CM | POA: Diagnosis not present

## 2021-02-20 DIAGNOSIS — R079 Chest pain, unspecified: Secondary | ICD-10-CM

## 2021-02-20 DIAGNOSIS — I5032 Chronic diastolic (congestive) heart failure: Secondary | ICD-10-CM | POA: Diagnosis not present

## 2021-02-20 DIAGNOSIS — I10 Essential (primary) hypertension: Secondary | ICD-10-CM

## 2021-02-20 DIAGNOSIS — I200 Unstable angina: Secondary | ICD-10-CM | POA: Diagnosis not present

## 2021-02-20 MED ORDER — METOPROLOL SUCCINATE ER 50 MG PO TB24: 50.0000 mg | ORAL_TABLET | Freq: Every day | ORAL | 3 refills | Status: DC

## 2021-02-20 MED ORDER — NITROGLYCERIN 0.4 MG SL SUBL: 0.4000 mg | SUBLINGUAL_TABLET | SUBLINGUAL | 3 refills | Status: AC | PRN

## 2021-02-20 NOTE — Assessment & Plan Note (Addendum)
EF has been as low as 35.  This is improved to normal.  Her most recent echocardiogram in November 2022 demonstrated EF 55-60.  Her volume status appears stable.  She does describe NYHA III symptoms.  Obtain BNP today.  If her BNP is significantly elevated, I will place her on furosemide.  Continue Entresto 97/103 mg twice daily.  Continue beta-blocker therapy with metoprolol succinate at increased dose of 50 mg daily as outlined.

## 2021-02-20 NOTE — Assessment & Plan Note (Signed)
Blood pressure somewhat elevated.  Continue Entresto 97/103 mg twice daily.  Increase metoprolol succinate to 50 mg daily.

## 2021-02-20 NOTE — Assessment & Plan Note (Signed)
She notes symptoms of exertional shortness of breath and associated chest heaviness.  These have been fairly chronic symptoms but have worsened over the past couple of months.  She has a prior history of COVID with associated pulmonary embolism.  She saw Dr. Vaughan Browner with pulmonology in July.  She did not require long-term anticoagulation.  Her apixaban was stopped at that visit.  Her D-dimer was negative.  High-resolution CT did demonstrate some parenchymal scarring.  She had no suspicious lung nodules.  She had a heart catheterization in 2019 that demonstrated no CAD.  Her recent CT did demonstrate coronary artery calcification.  Her echocardiogram in 11/22 demonstrated normal LV function with mild diastolic dysfunction.  Her symptoms are certainly concerning for angina.  She also has relief with inhaled medications.  Question if her symptoms are related to asthma/COPD.  Overall, it is likely that her symptoms are multifactorial and related to obesity, deconditioning, disability from her back and chronic CHF.  She does not seem to have signs of volume excess on exam today.  Arrange Lexiscan Myoview to rule out ischemia  Obtain CMET, CBC, BNP  Rx for as needed nitroglycerin  Start furosemide if BNP significantly elevated  If cardiac work-up negative, follow-up with primary care given response to bronchodilators  Follow-up 2-3 months

## 2021-02-20 NOTE — Patient Instructions (Signed)
Medication Instructions:  Your physician has recommended you make the following change in your medication:   INCREASE the Toprol to 50 mg taking 1 daily  START Nitroglycerin 0.4 s/l tablets:  TAKE ONLY AS NEEDED FOR CHEST PAIN, AS DIRECTED   *If you need a refill on your cardiac medications before your next appointment, please call your pharmacy*   Lab Work: TODAY:  CMET, LIPID, PRO BNP, & CBC  If you have labs (blood work) drawn today and your tests are completely normal, you will receive your results only by: MyChart Message (if you have MyChart) OR A paper copy in the mail If you have any lab test that is abnormal or we need to change your treatment, we will call you to review the results.   Testing/Procedures: Your physician has requested that you have a lexiscan myoview. For further information please visit [email or web address]. Please follow instruction sheet, BELOW:    You are scheduled for a Myocardial Perfusion Imaging Study  Please arrive 15 minutes prior to your appointment time for registration and insurance purposes.  The test will take approximately 3 to 4 hours to complete; you may bring reading material.  If someone comes with you to your appointment, they will need to remain in the main lobby due to limited space in the testing area. **If you are pregnant or breastfeeding, please notify the nuclear lab prior to your appointment**  How to prepare for your Myocardial Perfusion Test: Do not eat or drink 3 hours prior to your test, except you may have water. Do not consume products containing caffeine (regular or decaffeinated) 12 hours prior to your test. (ex: coffee, chocolate, sodas, tea). Do bring a list of your current medications with you.  If not listed below, you may take your medications as normal. Do wear comfortable clothes (no dresses or overalls) and walking shoes, tennis shoes preferred (No heels or open toe shoes are allowed). Do NOT wear cologne, perfume,  aftershave, or lotions (deodorant is allowed). If these instructions are not followed, your test will have to be rescheduled.      Follow-Up: At [facility], you and your health needs are our priority.  As part of our continuing mission to provide you with exceptional heart care, we have created designated Provider Care Teams.  These Care Teams include your primary Cardiologist (physician) and Advanced Practice Providers (APPs -  Physician Assistants and Nurse Practitioners) who all work together to provide you with the care you need, when you need it.  We recommend signing up for the patient portal called "MyChart".  Sign up information is provided on this After Visit Summary.  MyChart is used to connect with patients for Virtual Visits (Telemedicine).  Patients are able to view lab/test results, encounter notes, upcoming appointments, etc.  Non-urgent messages can be sent to your provider as well.   To learn more about what you can do with MyChart, go to [email or web address].    Your next appointment:   2-3 month(s)  The format for your next appointment:   In Person  Provider:   Candee Furbish, MD  or Richardson Dopp, PA-C         Other Instructions

## 2021-02-20 NOTE — Assessment & Plan Note (Signed)
Given coronary artery calcification on CT scan, obtain fasting lipids today.  If LDL over 70, start statin therapy.

## 2021-02-21 LAB — COMPREHENSIVE METABOLIC PANEL
ALT: 9 IU/L (ref 0–32)
AST: 16 IU/L (ref 0–40)
Albumin/Globulin Ratio: 1.7 (ref 1.2–2.2)
Albumin: 4.2 g/dL (ref 3.7–4.7)
Alkaline Phosphatase: 86 IU/L (ref 44–121)
BUN/Creatinine Ratio: 16 (ref 12–28)
BUN: 14 mg/dL (ref 8–27)
Bilirubin Total: 0.5 mg/dL (ref 0.0–1.2)
CO2: 23 mmol/L (ref 20–29)
Calcium: 9 mg/dL (ref 8.7–10.3)
Chloride: 105 mmol/L (ref 96–106)
Creatinine, Ser: 0.89 mg/dL (ref 0.57–1.00)
Globulin, Total: 2.5 g/dL (ref 1.5–4.5)
Glucose: 96 mg/dL (ref 70–99)
Potassium: 4.2 mmol/L (ref 3.5–5.2)
Sodium: 143 mmol/L (ref 134–144)
Total Protein: 6.7 g/dL (ref 6.0–8.5)
eGFR: 67 mL/min/{1.73_m2} (ref >59)

## 2021-02-21 LAB — CBC
Hematocrit: 41.5 % (ref 34.0–46.6)
Hemoglobin: 13.3 g/dL (ref 11.1–15.9)
MCH: 29 pg (ref 26.6–33.0)
MCHC: 32 g/dL (ref 31.5–35.7)
MCV: 90 fL (ref 79–97)
Platelets: 218 10*3/uL (ref 150–450)
RBC: 4.59 x10E6/uL (ref 3.77–5.28)
RDW: 13.1 % (ref 11.7–15.4)
WBC: 6.5 10*3/uL (ref 3.4–10.8)

## 2021-02-21 LAB — LIPID PANEL
Chol/HDL Ratio: 4.8 ratio — ABNORMAL HIGH (ref 0.0–4.4)
Cholesterol, Total: 233 mg/dL — ABNORMAL HIGH (ref 100–199)
HDL: 49 mg/dL (ref >39)
LDL Chol Calc (NIH): 156 mg/dL — ABNORMAL HIGH (ref 0–99)
Triglycerides: 153 mg/dL — ABNORMAL HIGH (ref 0–149)
VLDL Cholesterol Cal: 28 mg/dL (ref 5–40)

## 2021-02-21 LAB — PRO B NATRIURETIC PEPTIDE: NT-Pro BNP: 271 pg/mL (ref 0–738)

## 2021-02-23 ENCOUNTER — Other Ambulatory Visit: Payer: Self-pay | Admitting: *Deleted

## 2021-02-23 MED ORDER — ROSUVASTATIN CALCIUM 20 MG PO TABS: 20.0000 mg | ORAL_TABLET | Freq: Every day | ORAL | 3 refills | Status: DC

## 2021-03-04 ENCOUNTER — Other Ambulatory Visit: Payer: Self-pay | Admitting: Cardiology

## 2021-03-06 ENCOUNTER — Ambulatory Visit (INDEPENDENT_AMBULATORY_CARE_PROVIDER_SITE_OTHER): Payer: Medicare Other | Admitting: Internal Medicine

## 2021-03-06 ENCOUNTER — Encounter: Payer: Self-pay | Admitting: Internal Medicine

## 2021-03-06 VITALS — BP 132/84 | HR 56 | Temp 98.7°F | Resp 18 | Ht 66.0 in | Wt 248.5 lb

## 2021-03-06 DIAGNOSIS — F32A Depression, unspecified: Secondary | ICD-10-CM

## 2021-03-06 DIAGNOSIS — R5382 Chronic fatigue, unspecified: Secondary | ICD-10-CM | POA: Diagnosis not present

## 2021-03-06 DIAGNOSIS — E538 Deficiency of other specified B group vitamins: Secondary | ICD-10-CM

## 2021-03-06 DIAGNOSIS — E782 Mixed hyperlipidemia: Secondary | ICD-10-CM | POA: Diagnosis not present

## 2021-03-06 DIAGNOSIS — F419 Anxiety disorder, unspecified: Secondary | ICD-10-CM

## 2021-03-06 DIAGNOSIS — K589 Irritable bowel syndrome without diarrhea: Secondary | ICD-10-CM | POA: Diagnosis not present

## 2021-03-06 MED ORDER — ALBUTEROL SULFATE HFA 108 (90 BASE) MCG/ACT IN AERS: 2.0000 | INHALATION_SPRAY | Freq: Four times a day (QID) | RESPIRATORY_TRACT | 5 refills | Status: DC | PRN

## 2021-03-06 NOTE — Progress Notes (Signed)
Subjective:    Patient ID: [name], female    DOB: [birth year], 76 y.o.   MRN: [id number]  DOS:  03/06/2021 Type of visit - description: rov  This is a follow-up from the visit  October 2022. At that time her main concerns were fatigue and IBS. Today she reports no new symptoms except some pain at the thoracic spine at the level C3 or 4 with some radiation to the L arm. Denies any fall or injury, no upper or lower extremity paresthesias, no bladder or bowel incontinence.  Also saw cardiology, note and labs reviewed  Review of Systems See above   Past Medical History:  Diagnosis Date   Allergy    Anemia    Anxiety    Barrett's esophagus    Bilateral carpal tunnel syndrome    Dr. Eddie Dibbles, having injection therapy   C. difficile diarrhea 08/01/2012   severe 2014   Carotid arterial disease ([place])    Cataract    removed both eyes    Chronic combined systolic and diastolic CHF (congestive heart failure) ([place])    Chronic cystitis    Dr. Matilde Sprang   Chronic lower back pain    Chronic pain syndrome    Depression    Dizziness    DJD (degenerative joint disease)    bilateral hands; knees   Family history of anesthesia complication    Mother had severe N/V   Fibromyalgia    GERD (gastroesophageal reflux disease)    H/O cardiac catheterization    (-) cath 12-2002  , cath again 2011 (-)   History of colon polyps    HTN (hypertension)    Hyperlipidemia    past hx    IBS (irritable bowel syndrome)    Insomnia    Migraine    Morbid obesity ([place])    Non-ischemic cardiomyopathy ([place])    Osteoporosis    pt unsure of this   RLS (restless legs syndrome)    Vertigo    Vitamin B 12 deficiency 04/09/2013    Past Surgical History:  Procedure Laterality Date   Arm surgery Left    "don't remember what they did; arm wasn't broken"   BILATERAL KNEE ARTHROSCOPY Bilateral    BIOPSY  08/07/2018   Procedure: BIOPSY;  Surgeon: Ladene Artist, MD;  Location: [facility];  Service:  Endoscopy;;   CARDIAC CATHETERIZATION  01/22/10   clean cath   CATARACT EXTRACTION W/ INTRAOCULAR LENS  IMPLANT, BILATERAL Bilateral    CHOLECYSTECTOMY N/A 10/25/2016   Procedure: LAPAROSCOPIC CHOLECYSTECTOMY;  Surgeon: Georganna Skeans, MD;  Location: [facility];  Service: General;  Laterality: N/A;   COLONOSCOPY     COLONOSCOPY W/ POLYPECTOMY     ESOPHAGOGASTRODUODENOSCOPY (EGD) WITH PROPOFOL N/A 08/07/2018   Procedure: ESOPHAGOGASTRODUODENOSCOPY (EGD) WITH PROPOFOL;  Surgeon: Ladene Artist, MD;  Location: [facility];  Service: Endoscopy;  Laterality: N/A;   FOOT SURGERY Bilateral    toenails removed; callus removed on right; hammertoes right"   Knot     "removed from right neck; not a goiter"   LUMBAR DISC SURGERY     L5 S1 anterior fusion   OOPHORECTOMY     RIGHT/LEFT HEART CATH AND CORONARY ANGIOGRAPHY N/A 05/06/2017   Procedure: RIGHT/LEFT HEART CATH AND CORONARY ANGIOGRAPHY;  Surgeon: Nelva Bush, MD;  Location: [facility];  Service: Cardiovascular;  Laterality: N/A;   SHOULDER ARTHROSCOPY Right    TOTAL KNEE ARTHROPLASTY  10/05/2011   Procedure: TOTAL KNEE ARTHROPLASTY;  Surgeon: Collier Salina  Autumn Patty, MD;  Location: [facility];  Service: Orthopedics;  Laterality: Right;   UPPER GASTROINTESTINAL ENDOSCOPY     VAGINAL HYSTERECTOMY     for endometriosis    Current Outpatient Medications  Medication Instructions   albuterol (VENTOLIN HFA) 108 (90 Base) MCG/ACT inhaler 2 puffs, Inhalation, Every 6 hours PRN   Cyanocobalamin (VITAMIN B12) 500 MCG TABS 1 tablet, Oral, Daily   dicyclomine (BENTYL) 20 mg, Oral, 3 times daily before meals & bedtime   diphenhydrAMINE (BENADRYL) 25 mg, Oral, Every 6 hours PRN   ENTRESTO 97-103 MG TAKE 1 TABLET TWICE A DAY   HYDROcodone-acetaminophen (NORCO) 10-325 MG tablet 1 tablet, Oral, Every 6 hours PRN   loperamide (IMODIUM) 2 MG capsule Patient takes 2-4 tablets by mouth as needed for diarrhea or loose stools   metoCLOPramide (REGLAN) 10 mg,  Oral, Every 6 hours   metoprolol succinate (TOPROL-XL) 50 mg, Oral, Daily, Take with or immediately following a meal.   NARCAN 4 MG/0.1ML LIQD nasal spray kit 1 spray, Once PRN   nitroGLYCERIN (NITROSTAT) 0.4 mg, Sublingual, Every 5 min PRN   omeprazole (PRILOSEC) 40 mg, Oral, 2 times daily   ondansetron (ZOFRAN) 4 MG tablet Take 1 tablet by mouth 30 minutes ac meals and hs x 3 days then PRN   pregabalin (LYRICA) 75 mg, Oral, 3 times daily   promethazine (PHENERGAN) 25 mg, Oral, Every 6 hours PRN   Propylene Glycol 0.6 % SOLN 2 drops, Ophthalmic, As needed   rOPINIRole (REQUIP) 1 MG tablet TAKE 1 TABLET BY MOUTH TWICE A DAY   rosuvastatin (CRESTOR) 20 mg, Oral, Daily   sertraline (ZOLOFT) 75 mg, Oral, Daily   traZODone (DESYREL) 50 MG tablet TAKE 1-2 TABLETS BY MOUTH AT BEDTIME.   Vitamin D 4,000 Units, Oral, Daily       Objective:   Physical Exam BP 132/84 (BP Location: Left Arm, Patient Position: Sitting, Cuff Size: Normal)    Pulse (!) 56    Temp 98.7 F (37.1 C) (Oral)    Resp 18    Ht '5\' 6"'  (1.676 m)    Wt 248 lb 8 oz (112.7 kg)    SpO2 98%    BMI 40.11 kg/m  General:   Well developed, NAD, BMI noted. HEENT:  Normocephalic . Face symmetric, atraumatic Lungs:  CTA B Normal respiratory effort, no intercostal retractions, no accessory muscle use. Heart: RRR,  no murmur.  Lower extremities: no pretibial edema bilaterally  Skin: Not pale. Not jaundice Neurologic:  alert & oriented X3.  Speech normal, gait assisted by cane, needs Psych--  Cognition and judgment appear intact.  Cooperative with normal attention span and concentration.  Behavior appropriate. No anxious or depressed appearing.      Assessment   Assessment   HTN Hyperlipidemia-  Pravachol, Crestor: Myalgias. Intolerant to Zetia (joint aches), see OV  06/2016  Depression, Anxiety (on clonazepam), insomnia (on trazodone):  depression x many  years, remotely on zoloft and other meds per psych, I rx lexapro  2015, then was rx effexor x a while Morbid obesity CV: CHF/ non ischemic cardiomyopathy dx 04-2017; cath normal coronaries, EF ~ 30% Carotid artery disease: 1 to 39% bilateral carotids 09-2016, start aspirin per neurology GI:  --GERD, Barrett's esophagus, had a EGD 07/2018  --h/o persistent C. difficile diarrhea 2014 --Cholecystectomy B12 deficiency Osteopenia: Dexa 2015 showed osteopenia, T score -1.9 (10/2016): Rx cs, vit D PULM: --NPSG 2008:  No osa, PLMS 4/hr with arousal and awakening --RLS - Requip  Migraines, f/u [facility] -- off topamax as off 06-2016 MSK: --Back pain, chronic: pain meds rx by ortho, Workers comp MD @ [employer] pain mngmt [place] --DJD,CTS B ([facility]) --Fibromyalgia.  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  Took anticoagulants until 08-2020.  PLAN Fatigue: See visit from October 2022.  Symptoms are at baseline Chronic SOB/CP  Saw cardiology 02/20/2021, was seen with chronic SOB/CP, they Rx stress test (pending). proBNP, CMP, CBC were okay. Hyperlipidemia: Cardiology Rx Crestor few days ago, so far tolerating it well. Suspected IBS:   CT abdomen and pelvis November 2022: Negative for a possible etiology for GI symptoms. Anxiety, depression, insomnia: Continue sertraline and trazodone B12 deficiency: Last levels very good Vitamin D deficiency: Last level good. Refill albuterol RTC 6 months     This visit occurred during the SARS-CoV-2 public health emergency.  Safety protocols were in place, including screening questions prior to the visit, additional usage of staff PPE, and extensive cleaning of exam room while observing appropriate contact time as indicated for disinfecting solutions.

## 2021-03-06 NOTE — Patient Instructions (Addendum)
   GO TO THE FRONT DESK, PLEASE SCHEDULE YOUR APPOINTMENTS Come back for  a check up in 6 months  

## 2021-03-07 NOTE — Assessment & Plan Note (Signed)
Fatigue: See visit from October 2022.  Symptoms are at baseline Chronic SOB/CP  Saw cardiology 02/20/2021, was seen with chronic SOB/CP, they Rx stress test (pending). proBNP, CMP, CBC were okay. Hyperlipidemia: Cardiology Rx Crestor few days ago, so far tolerating it well. Suspected IBS:   CT abdomen and pelvis November 2022: Negative for a possible etiology for GI symptoms. Anxiety, depression, insomnia: Continue sertraline and trazodone B12 deficiency: Last levels very good Vitamin D deficiency: Last level good. Refill albuterol RTC 6 months

## 2021-03-12 ENCOUNTER — Ambulatory Visit (HOSPITAL_COMMUNITY): Payer: Medicare Other

## 2021-03-13 ENCOUNTER — Ambulatory Visit (HOSPITAL_COMMUNITY): Payer: Medicare Other

## 2021-03-13 DIAGNOSIS — Z961 Presence of intraocular lens: Secondary | ICD-10-CM | POA: Diagnosis not present

## 2021-03-13 DIAGNOSIS — H40023 Open angle with borderline findings, high risk, bilateral: Secondary | ICD-10-CM | POA: Diagnosis not present

## 2021-03-17 ENCOUNTER — Telehealth (HOSPITAL_COMMUNITY): Payer: Self-pay

## 2021-03-17 NOTE — Telephone Encounter (Signed)
Spoke with the patient, detailed instructions given. She stated that she understood and would be here for her test. Asked to call back with any questions. S.Shavone Nevers EMTP 

## 2021-03-19 ENCOUNTER — Ambulatory Visit (HOSPITAL_COMMUNITY): Payer: Medicare Other

## 2021-03-20 ENCOUNTER — Ambulatory Visit (HOSPITAL_COMMUNITY): Payer: Medicare Other

## 2021-04-04 ENCOUNTER — Other Ambulatory Visit: Payer: Self-pay | Admitting: Internal Medicine

## 2021-04-07 ENCOUNTER — Ambulatory Visit (INDEPENDENT_AMBULATORY_CARE_PROVIDER_SITE_OTHER): Payer: Medicare Other | Admitting: Pharmacist

## 2021-04-07 ENCOUNTER — Telehealth (HOSPITAL_COMMUNITY): Payer: Self-pay

## 2021-04-07 DIAGNOSIS — I10 Essential (primary) hypertension: Secondary | ICD-10-CM | POA: Diagnosis not present

## 2021-04-07 DIAGNOSIS — F419 Anxiety disorder, unspecified: Secondary | ICD-10-CM

## 2021-04-07 DIAGNOSIS — F32A Depression, unspecified: Secondary | ICD-10-CM

## 2021-04-07 DIAGNOSIS — E782 Mixed hyperlipidemia: Secondary | ICD-10-CM | POA: Diagnosis not present

## 2021-04-07 DIAGNOSIS — Z9114 Patient's other noncompliance with medication regimen: Secondary | ICD-10-CM

## 2021-04-07 DIAGNOSIS — I5042 Chronic combined systolic (congestive) and diastolic (congestive) heart failure: Secondary | ICD-10-CM

## 2021-04-07 DIAGNOSIS — K2100 Gastro-esophageal reflux disease with esophagitis, without bleeding: Secondary | ICD-10-CM

## 2021-04-07 DIAGNOSIS — Z91148 Patient's other noncompliance with medication regimen for other reason: Secondary | ICD-10-CM

## 2021-04-07 NOTE — Patient Instructions (Addendum)
[name] It was a pleasure speaking with you today.  I have attached a summary of our visit today and information about your health goals.  (See below)   Patient Goals/Self-Care Activities Over the next 90 days, patient will:  take medications as prescribed check with [facility] about cost of Tetanus booster and Shingrix vaccines.  Take metoprolol 50mg  - 1 tablet daily per cardiology office. Monitor weight and heart rate daily. Call office if you notice weight gain of more than 3 lbs in 24 hours or 5 lbs in 1 week. Or if heart rate is less than 50 or you experience dizziness.  If you have any questions or concerns, please feel free to contact me either at the phone number below or with a MyChart message.   Keep up the good work!  [name], PharmD Clinical Pharmacist [facility] [phone] (direct line)  [phone] (main office number)   Chronic Care Management Care plan (updated 03/18/2021)  Hypertension / Congestive Heart Failure: Blood pressure goal <140/90  CHF goals: prevent exacerbation of CHF and minimize symptoms of CHF  BP Readings from Last 3 Encounters:  03/06/21 132/84  02/20/21 (!) 142/74  02/08/21 (!) 172/81    current treatment: [name] 97/103 mg twice a day Metoprolol ER 50mg  daily  Interventions:   Educated on benefits of Entresto and metoprolol for heart health Counseled to monitor weight and heart rate Coordinated refill for metoprolol ER 50mg  take 1 tablet daily   Hyperlipidemia: Last LDL at goal of <100 but lipids last checked in 2017  Lab Results  Component Value Date   LDLCALC 74 10/30/2015   [place] 158 (H) 09/18/2015   LDLCALC 101 (H) 07/14/2015   current treatment:  Rosuvastatin 20mg  daily  Interventions:  Recommended limit intake of saturated fat and cholesterol Continue to take rosuvastatin  Depression/sleep disturbance: current treatment: Sertraline 50mg  - take 1.5 tablets = 75mg  daily   Trazodone 50mg  - take 1 or 2 tablets at bedtime Interventions:  Continue current therapy  GERD with Barrett's Esophagus / abdominal cramps: Controlled Current therapy:  Omeprazole 40mg  twice a day Dicyclomine 20mg  take 1 tablet 4 times a day before meal and at bedtime. .  Interventions:  Recommended continue current therapy.   Health Maintenance:  Discussed getting vaccines - Tetanus booster and Shingrix vaccines Considering Tetanus and Shingrix if covered by insurance. Patient to check with [facility] about cost.   Patient Goals/Self-Care Activities Over the next 90 days, patient will:  take medications as prescribed check with [facility] about cost of Tetanus booster and Shingrix vaccines.  Take metoprolol 50mg  - 1 tablet daily per cardiology office. Monitor weight and heart rate daily. Call office if you notice weight gain of more than 3 lbs in 24 hours or 5 lbs in 1 week. Or if heart rate is less than 50 or you experience dizziness.  Follow Up Plan: Telephone follow up appointment with care management team member scheduled for:  2 to 3 months.  Patient verbalizes understanding of instructions and care plan provided today and agrees to view in [place]. Active MyChart status confirmed with patient.

## 2021-04-07 NOTE — Telephone Encounter (Signed)
Spoke with the patient, detailed instructions given. She stated that she would be here for her test. Asked to call back with any questions. S.Fatin Bachicha EMTP 

## 2021-04-07 NOTE — Chronic Care Management (AMB) (Signed)
Chronic Care Management Pharmacy Note  04/07/2021 Name:  [name] MRN:  [id number] DOB:  1946/01/07  Summary: Patient was unaware of change in metoprolol to 13m daily. Still had bottles of metoprolol 263mdaily at home, as well as 5039m take 1.5 tablet daily.    Recommendations/Changes made from today's visit: Coordinated with pharmacy to fill metoprolol ER 32m35mily. Cancelled refills for other strengths and directions of metoprolol.  Discussed vaccines - patient to check cost of Shingrix and Tetanus booster at local pharmacy  Subjective: Alexa BURRan 75 y73. year old female who is a primary patient of [name].  The CCM team was consulted for assistance with disease management and care coordination needs.    Engaged with patient by telephone for follow up visit in response to provider referral for pharmacy case management and/or care coordination services.   Consent to Services:  The patient was given information about Chronic Care Management services, agreed to services, and gave verbal consent prior to initiation of services.  Please see initial visit note for detailed documentation.   Patient Care Team: AlexaAlexa [name] as PCP - General [name] as PCP - Cardiology (Cardiology) MacDBjorn Loser as Consulting Physician (Urology) PaulMaia Breslow as Consulting Physician (Orthopedic Surgery) KaplAlden Hipp as Consulting Physician (Obstetrics and Gynecology) DumoPhylliss Bob as Consulting Physician (Orthopedic Surgery) AjamRed Christians as Referring Physician ([name] Medicine) StarLadene Artist as Consulting Physician (Gastroenterology) EckaCherre RobinsH-Huntsvillearmacist)  Recent office visits: 03/06/2021 - Internal Medicine ([name])[name] up visit. Refilled albuterol. F/U 6 months. 01/23/2021 - Internal Med ([name])[name] Visit for cough. Prescribed benxonatate 200mg26mimes a day as needed and hydrocodone / Homatripin cough syrup 5  milliliters up to every 6 hours as needed.  11/28/2020 - Internal Med ([name]) Alexa Kellsn for chronic fatigue.  11/20/2020 - Internal Med ([name]) [name] for chronic fatigue. No medication changes 10/11/20-Alexa Hopkins, [name]. Seen for Medicare Annual Wellness. [facility]B [facility]. Follow up in 1 year.  Recent consult visits: 02/20/2021 - Cardio ([name]) [facility] CHF, SOB and chest [name]. Started NTG as needed for chest [name]; Increased metoprolol from 25 to 32mg 22my. Added rosuvastatin 20mg d52m after lab showed elevated LDL. 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 We will check a blood test called D-dimer. If normal then we can stop the Eliquis CT of chest ordered. Follow up in clinic after CT.   [facility]: [facility] headache, nausea. Bronze statue fell on her head around 8:30pm previous day. CT head ordered and was unremarkable. Provided reglan and benadryl in ED with improvements. Discharged and instructed to take acetaminophen, Benadryl and Reglan as needed.   Objective:  Lab Results  Component Value Date   CREATININE 0.89 02/20/2021   CREATININE 0.87 12/02/2020   CREATININE 0.93 08/28/2020    Lab Results  Component Value Date   HGBA1C 5.6 04/15/2020   Last diabetic Eye exam: No results found for: HMDIABEYEEXA  Last diabetic Foot exam: No results found for: HMDIABFOOTEX      Component Value Date/Time   CHOL 233 (H) 02/20/2021 1111   TRIG 153 (H) 02/20/2021 1111   HDL 49 02/20/2021 1111   CHOLHDL 4.8 (H) 02/20/2021 1111   CHOLHDL 2 10/30/2015 1102  VLDL 13.6 10/30/2015 1102   LDLCALC 156 (H) 02/20/2021 1111   LDLDIRECT 166.0 04/14/2010 1539    Hepatic Function Latest Ref Rng & Units 02/20/2021 08/28/2020 12/07/2019  Total Protein 6.0 - 8.5 g/dL 6.7 7.2 7.3  Albumin 3.7 - 4.7 g/dL 4.2 4.0 3.7  AST 0 - 40 IU/L '16 14 19  ' ALT  0 - 32 IU/L '9 9 14  ' Alk Phosphatase 44 - 121 IU/L 86 82 67  Total Bilirubin 0.0 - 1.2 mg/dL 0.5 0.6 0.8  Bilirubin, Direct 0.0 - 0.3 mg/dL - - -    Lab Results  Component Value Date/Time   TSH 1.89 04/15/2020 02:40 PM   TSH 1.62 12/16/2017 02:16 PM    CBC Latest Ref Rng & Units 02/20/2021 12/02/2020 08/28/2020  WBC 3.4 - 10.8 x10E3/uL 6.5 6.3 6.2  Hemoglobin 11.1 - 15.9 g/dL 13.3 13.1 13.3  Hematocrit 34.0 - 46.6 % 41.5 39.4 40.7  Platelets 150 - 450 x10E3/uL 218 214 212.0    Lab Results  Component Value Date/Time   VD25OH 44 12/02/2020 07:48 AM   VD25OH 28.12 (L) 04/15/2020 02:40 PM   VD25OH 36 09/30/2010 03:48 PM    Clinical ASCVD: No  The 10-year ASCVD risk score (Arnett DK, et al., 2019) is: 24.6%   Values used to calculate the score:     Age: 20 years     Sex: Female     Is Non-Hispanic African American: No     Diabetic: No     Tobacco smoker: No     Systolic Blood Pressure: 154 mmHg     Is BP treated: Yes     HDL Cholesterol: 49 mg/dL     Total Cholesterol: 233 mg/dL    Other:   DEXA 08/15/2019 AP Spine L1-L3 02/15/2019 74.3 Normal 0.4 1.224 g/cm2 AP Spine L1-L3 10/04/2016 72.0 Normal 0.2 1.190 g/cm2   DualFemur Neck Left 02/15/2019 74.3 Osteopenia -1.8 0.787 g/cm2 DualFemur Neck Left 10/04/2016 72.0 Osteopenia -1.4 0.838 g/cm2   DualFemur Total Mean 02/15/2019 74.3 Osteopenia -1.2 0.860 g/cm2 DualFemur Total Mean 10/04/2016 72.0 Normal -0.8 0.910 g/cm2  FRAX* 10-year Probability of Fracture Based on femoral neck BMD: DualFemur (Left)   Major Osteoporotic Fracture: 10.9% Hip Fracture:                2.3% Population:                  [place] (Caucasian) Risk Factors:                None  Social History   Tobacco Use  Smoking Status Former   Packs/day: 0.50   Years: 10.00   Pack years: 5.00   Types: Cigarettes   Quit date: 02/09/1975   Years since quitting: 46.1  Smokeless Tobacco Never  Tobacco Comments   started at age 75.   quit in the 1970s.    BP Readings from Last 3 Encounters:  03/06/21 132/84  02/20/21 (!) 142/74  02/08/21 (!) 172/81   Pulse Readings from Last 3 Encounters:  03/06/21 (!) 56  02/20/21 66  02/08/21 84   Wt Readings from Last 3 Encounters:  03/06/21 248 lb 8 oz (112.7 kg)  02/20/21 245 lb 3.2 oz (111.2 kg)  02/08/21 244 lb (110.7 kg)    Assessment: Review of patient past medical history, allergies, medications, health status, including review of consultants reports, laboratory and other test data, was performed as part of comprehensive evaluation and provision of chronic care management services.  SDOH:  (Social Determinants of Health) assessments and interventions performed:     CCM Care Plan  Allergies  Allergen Reactions   Dextromethorphan-Guaifenesin Nausea And Vomiting   Morphine Nausea And Vomiting   Gabapentin Other (See Comments)    Dizziness, made her feel loopy   Penicillins Rash    "> 30 years ago; best I can remember it was just a light rash on my arm" Did it involve swelling of the face/tongue/throat, SOB, or low BP? No Did it involve sudden or severe rash/hives, skin peeling, or any reaction on the inside of your mouth or nose? Unknown Did you need to seek medical attention at a hospital or doctor's office? No When did it last happen?      more than 30 years  If all above answers are NO, may proceed with cephalosporin use.     Medications Reviewed Today     Reviewed by [name], RPH-CPP (Pharmacist) on 04/07/21 at 15  Med List Status: <None>   Medication Order Taking? Sig Documenting Provider Last Dose Status Informant  albuterol (VENTOLIN HFA) 108 (90 Base) MCG/ACT inhaler [id number] Yes Inhale 2 puffs into the lungs every 6 (six) hours as needed for wheezing or shortness of breath. Alexa Branch, MD Taking Active   Cholecalciferol (VITAMIN D) 50 MCG (2000 UT) CAPS [id number] Yes Take 4,000 Units by mouth daily. [provider] Taking Active Multiple  Informants  Cyanocobalamin (VITAMIN B12) 500 MCG TABS [id number] Yes Take 1 tablet by mouth daily. [provider] Taking Active   dicyclomine (BENTYL) 20 MG tablet [id number] Yes Take 1 tablet (20 mg total) by mouth 4 (four) times daily -  before meals and at bedtime. Ladene Artist, MD Taking Active            Med Note Olena Heckle, [facility]   Fri Feb 20, 2021  9:39 AM)    [name] 97-103 MG [id number] Yes TAKE 1 TABLET TWICE A DAY Jerline Pain, MD Taking Active   HYDROcodone-acetaminophen [facility]) 10-325 MG tablet [id number] Yes Take 1 tablet by mouth every 6 (six) hours as needed. [provider] Taking Active   loperamide (IMODIUM) 2 MG capsule [id number] Yes Patient takes 2-4 tablets by mouth as needed for diarrhea or loose stools [provider] Taking Active   metoprolol succinate (TOPROL-XL) 50 MG 24 hr tablet [id number] Yes Take 1 tablet (50 mg total) by mouth daily. Take with or immediately following a meal. Richardson Dopp T, PA-C Taking Active   NARCAN 4 MG/0.1ML LIQD nasal spray kit [id number] No Place 1 spray into the nose once as needed (opioid od).  Patient not taking: Reported on 03/06/2021   [provider] Not Taking Active            Med Note [facility], CARLOS A   Thu Jan 17, 2020  1:27 PM)    nitroGLYCERIN (NITROSTAT) 0.4 MG SL tablet [id number] No Place 1 tablet (0.4 mg total) under the tongue every 5 (five) minutes as needed for chest [name].  Patient not taking: Reported on 03/06/2021   Sharmon Revere Not Taking Active            Med Note (CANTER, Cheri Rous   Fri Mar 06, 2021  1:01 PM) PRN  omeprazole (PRILOSEC) 40 MG capsule [id number] Yes Take 1 capsule (40 mg total) by mouth 2 (two) times daily. Ladene Artist, MD Taking Active   ondansetron [facility]) 4 MG tablet [id number]  Take 1 tablet  by mouth 30 minutes ac meals and hs x 3 days then PRN Ladene Artist, MD  Active   pregabalin (LYRICA) 75 MG capsule [id number] Yes Take 75 mg by mouth  3 (three) times daily. [provider] Taking Active   promethazine (PHENERGAN) 25 MG tablet [id number]  Take 25 mg by mouth every 6 (six) hours as needed for nausea or vomiting. [provider]  Active   Propylene Glycol 0.6 % SOLN [id number] Yes Apply 2 drops to eye as needed (for dry eyes). [provider] Taking Active   rOPINIRole (REQUIP) 1 MG tablet [id number] Yes TAKE 1 TABLET BY MOUTH TWICE A DAY [name], Alda Berthold, MD Taking Active   rosuvastatin (CRESTOR) 20 MG tablet [id number] Yes Take 1 tablet (20 mg total) by mouth daily. Richardson Dopp T, PA-C Taking Active   sertraline (ZOLOFT) 50 MG tablet [id number] Yes TAKE 1 AND 1/2 TABLETS     DAILY Alexa Branch, MD Taking Active   traZODone (DESYREL) 50 MG tablet [id number] Yes TAKE 1-2 TABLETS BY MOUTH AT BEDTIME. Alexa Branch, MD Taking Active             Patient Active Problem List   Diagnosis Date Noted   Carotid artery disease ([place]) 02/19/2021   Pulmonary embolism ([place]) 12/07/2019   Gastric polyps    Fibromyalgia, see office visit note from 02/14/2018 02/15/2018   Paresthesia 12/29/2017   Gait abnormality 09/21/2017   Dizziness 09/21/2017   Raynaud phenomenon 07/22/2017   Nonischemic cardiomyopathy ([place]) 05/18/2017   (HFimpEF) heart failure with improved EF 05/05/2017   Headache 05/05/2017   Chest [name] 05/04/2017   Insomnia 10/23/2016   Morbid obesity ([place]) 12/16/2015   Myalgia 07/10/2015   Hyperlipidemia 07/10/2015   PCP NOTES >>>>>>>>>>>>>>>>> 10/31/2014   Anxiety and depression 09/10/2013   Vitamin B 12 deficiency 04/09/2013   Chronic cystitis 05/29/2012   Annual physical exam 06/14/2011   PARESTHESIA 04/01/2009   DJD (degenerative joint disease) 10/21/2008   BARRETTS ESOPHAGUS 07/19/2007   RESTLESS LEGS SYNDROME 05/11/2007   Migraine-- on topamax, rx by [facility] center 07/27/2006   Hypertension 07/27/2006   Osteopenia 07/27/2006   Chronic fatigue 07/27/2006    Immunization History  Administered  Date(s) Administered   Fluad Quad(high Dose 65+) 10/31/2018   Influenza Split 02/17/2011   Influenza Whole 01/14/2010   Influenza, High Dose Seasonal PF 01/24/2015, 12/16/2017   Influenza,inj,Quad PF,6+ Mos 10/22/2013   Influenza-Unspecified 03/13/2016, 10/20/2016   Pneumococcal Conjugate-13 09/10/2013   Pneumococcal Polysaccharide-23 04/14/2010, 08/21/2020   Td 02/08/1998, 04/14/2010   Zoster, Live 12/06/2013    Conditions to be addressed/monitored: CHF, HTN, HLD, Depression, and history of PE - related to [place]; osteopenia; GERD with Barrett's esophagus; neuropathy; fibromylagia  Care Plan : General Pharmacy (Adult)  Updates made by [name], RPH-CPP since 04/07/2021 12:00 AM     Problem: Chronic Conditions: Heart failure, hypertension, Irritable bowel syndrome; Restless leg syndrome, depression, insomnia, osteopenia, chronic [name], fibromyalgia, GERD with Barrett's esophagus   Priority: High  Onset Date: 10/21/2020     Goal: Medication and Chronic Care Management with improve patient knowledge about disease states, treatment and goals and improved adhernece   Start Date: 10/21/2020  Priority: High  Note:   Current Barriers:  Unable to independently monitor therapeutic efficacy Does not adhere to prescribed medication regimen (improving)  Patient has 2 bottles of metoprolol at home - neither of which are correct dose and directions. She has been taking 47m daily (only directions)  Pharmacist  Clinical Goal(s):  Over the next 90 days, patient will achieve adherence to monitoring guidelines and medication adherence to achieve therapeutic efficacy maintain control of hypertension, acid reflux and mood as evidenced by maintaining goals listed below  adhere to prescribed medication regimen as evidenced by refill history  through collaboration with PharmD and provider.   Interventions: 1:1 collaboration with Alexa Branch, MD regarding development and update of comprehensive plan  of care as evidenced by provider attestation and co-signature Inter-disciplinary care team collaboration (see longitudinal plan of care) Comprehensive medication review performed; medication list updated in electronic medical record  Hypertension / Congestive Heart Failure: Blood pressure has been variable Blood pressure goal <140/90  CHF goals: prevent exacerbation of CHF and minimize symptoms of CHF Managed by cardiologist Alexa Marlou Porch  HF with improved EF Noted to have systolic-diastolic heart failure / non ischemic cardiomyopathy EF was 35% initially; Most recent EF was 60-65% on 12/08/2019  BP Readings from Last 3 Encounters:  03/06/21 132/84  02/20/21 (!) 142/74  02/08/21 (!) 172/81  current treatment: [name] 97/103 mg twice a day Metoprolol ER 59m - take 1 tablet daily  Metoprolol dose was increased from 224mdaily to 5074maily at appointment with cardiology 02/20/2021 - has not filled new dose yet.  Current home blood pressure readings: not currently checking blood pressure at home. Patient report she has bought 2 blood pressure cuffs in past and both broke so she does not want to purchase another Denies hypotensive or hypertensive symptoms Interventions:   Educated on benefits of Entresto and metoprolol for heart health Counseled on monitor weight and heart rate Coordinated with pharmacy to cancel old Rx for metoprolol 75m38md to fill new Rx for metoprolol 50mg76mly   Hyperlipidemia: Uncontrolled; recent CT of lung noted aortic athlesclerolsis on 11/17/2020. LDL goal of <70 Lab Results  Component Value Date   LDLCALC 156 (H) 02/20/2021   LDLCALC 74 10/30/2015   LDLCALC 158 (H) 09/18/2015  current treatment:  Rosuvastatin 20mg 71my (started 02/23/2021)  Medications previously tried: pravastatin, rosuvastatin (both noted to cause myalgias); ezetimibe tried in 2018 - caused joint [name]  Per patient she is tolerating rosuvastatin so far. Denies new myalgias Patient has  stress test scheduled for 3/2 and 3/3 Interventions:  Recommended limit intake of saturated fat and cholesterol Continue rosuvastatin and plan to recheck lipids in April 2023 Discussed non statin options for treating cholesterol / lower CVD risk. Patient is open to consider. (Addressed at previous visit) Reviewed her BCBS FSonoitaer 2 but requires prior authorization (estimated cost $50/30 days) Nexletol is tier 3 but requires prior authorization (estimated cost 60% of med cost)  Depression/sleep disturbance: Controlled;  current treatment: Sertraline 50mg -41me 1.5 tablets = 75mg da84mTrazodone 50mg - t60m1 or 2 tablets at bedtime Past medications tried: escitalopram / Lexapro in 2015; Effexor At last visit patient was only taking sertraline 1 tablet daily; has since restarted taking at prescribed dose.  Interventions:  Recommended continue sertraline 1.5 tablets daily. Continue current therapy  GERD with Barrett's Esophagus / abdominal cramps: Controlled Current therapy:  Omeprazole 40mg twic12mday Dicyclomine 20mg take 51mblet 4 times a day before meal and at bedtime.  Patient reports rare reflux symptoms since starting omeprazole. Usually occurs when eating Mexican foo[place]negat Lightarel[place]neras abdominal cramping.  Interventions:  Recommended continue current therapy.   Health Maintenance:  Discussed getting vaccines - Tetanus booster and Shingrix vaccines Considering Tetanus  and Shingrix if covered by insurance. Patient [facility] check with [facility] about cost.   Patient Goals/Self-Care Activities Over the next 90 days, patient will:  take medications as prescribed check with [facility] about cost of Tetanus booster and Shingrix vaccines.  Take metoprolol 76m - 1 tablet daily per cardiology office. Monitor weight and heart rate daily. Call office if you notice weight gain of more than 3 lbs in 24 hours or 5 lbs in 1 week. Or if heart rate is less than 50 or  you experience dizziness.   Follow Up Plan: Telephone follow up appointment with care management team member scheduled for:  2 to 3 months          Medication Assistance: None required.  Patient affirms current coverage meets needs.  [facility] pharmacy [facility]  [id number] [address], [place] [id number]: [phone]: [phone] [place] [place] [facility] Sites [id number]: [phone]: [phone]  Follow Up:  Patient agrees to Care Plan and Follow-up.  Plan: Telephone follow up appointment with care management team member scheduled for:  2 to 3 months  TaCherre RobinsPharmD Clinical Pharmacist [facility]

## 2021-04-09 ENCOUNTER — Ambulatory Visit (HOSPITAL_COMMUNITY): Payer: Medicare Other | Attending: Cardiology

## 2021-04-09 ENCOUNTER — Other Ambulatory Visit: Payer: Self-pay

## 2021-04-09 DIAGNOSIS — R079 Chest pain, unspecified: Secondary | ICD-10-CM | POA: Diagnosis not present

## 2021-04-09 MED ORDER — TECHNETIUM TC 99M TETROFOSMIN IV KIT
30.4000 | PACK | Freq: Once | INTRAVENOUS | Status: AC | PRN
  Administered 2021-04-09: 30.4 via INTRAVENOUS
  Filled 2021-04-09: qty 31

## 2021-04-10 ENCOUNTER — Ambulatory Visit (HOSPITAL_COMMUNITY): Payer: Medicare Other | Attending: Cardiovascular Disease

## 2021-04-10 DIAGNOSIS — R079 Chest pain, unspecified: Secondary | ICD-10-CM | POA: Insufficient documentation

## 2021-04-10 LAB — MYOCARDIAL PERFUSION IMAGING
LV dias vol: 121 mL (ref 46–106)
LV sys vol: 59 mL
Nuc Stress EF: 51 %
Peak HR: 76 {beats}/min
Rest HR: 57 {beats}/min
Rest Nuclear Isotope Dose: 30.4 mCi
SDS: 5
SRS: 1
SSS: 6
ST Depression (mm): 0 mm
Stress Nuclear Isotope Dose: 30.2 mCi
TID: 1.08

## 2021-04-10 MED ORDER — TECHNETIUM TC 99M TETROFOSMIN IV KIT
30.2000 | PACK | Freq: Once | INTRAVENOUS | Status: AC | PRN
  Administered 2021-04-10: 30.2 via INTRAVENOUS
  Filled 2021-04-10: qty 31

## 2021-04-10 MED ORDER — REGADENOSON 0.4 MG/5ML IV SOLN
0.4000 mg | Freq: Once | INTRAVENOUS | Status: AC
  Administered 2021-04-10: 0.4 mg via INTRAVENOUS

## 2021-04-12 ENCOUNTER — Encounter: Payer: Self-pay | Admitting: Physician Assistant

## 2021-04-19 ENCOUNTER — Other Ambulatory Visit: Payer: Self-pay | Admitting: Internal Medicine

## 2021-05-05 IMAGING — DX DG CHEST 2V
2 series · 2 of 2 positions shown · non-contrast
Comparison: May 04, 2008.

CLINICAL DATA: Positive [id number].  Shortness of breath.

EXAM:
CHEST - 2 VIEW

[chest pa]
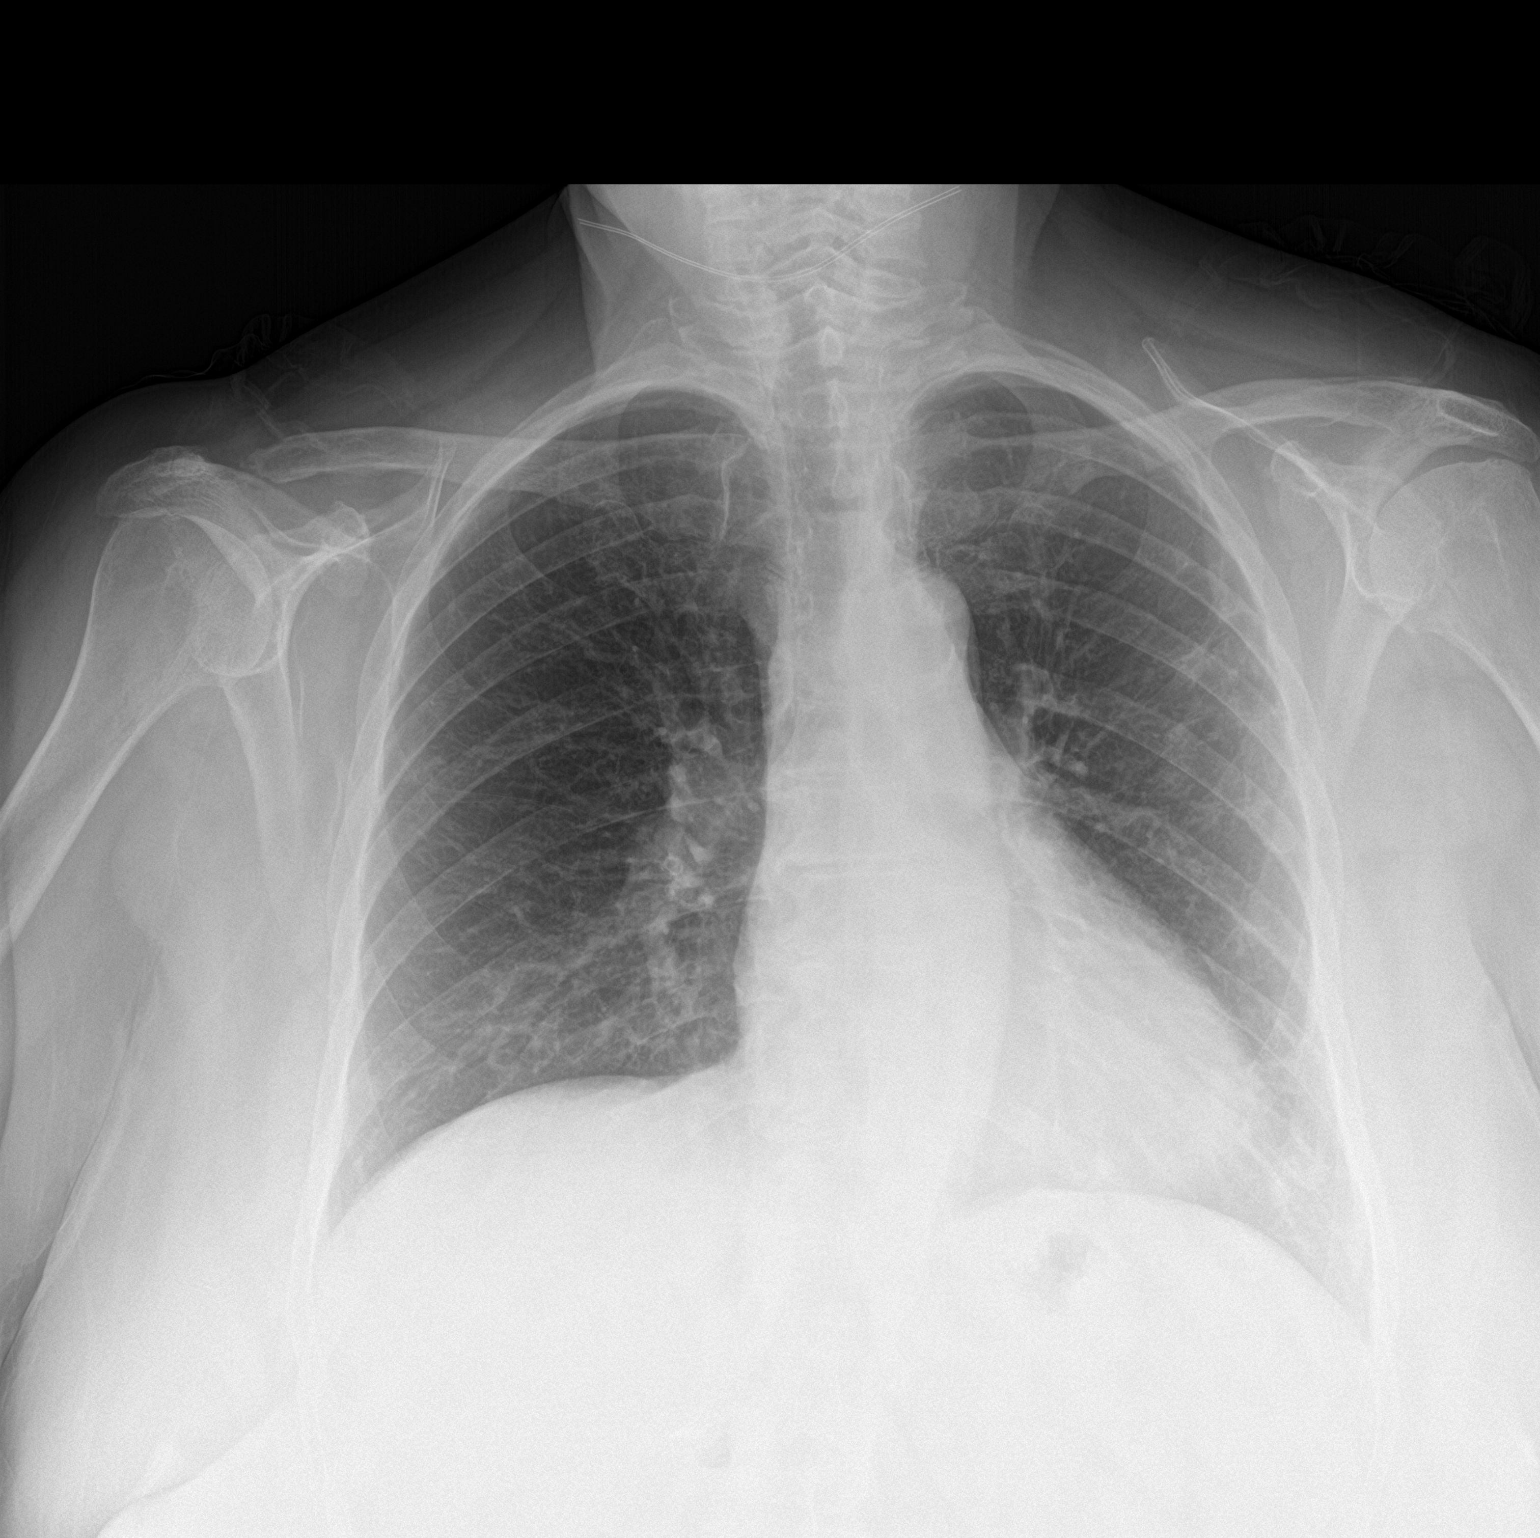

[chest lat]
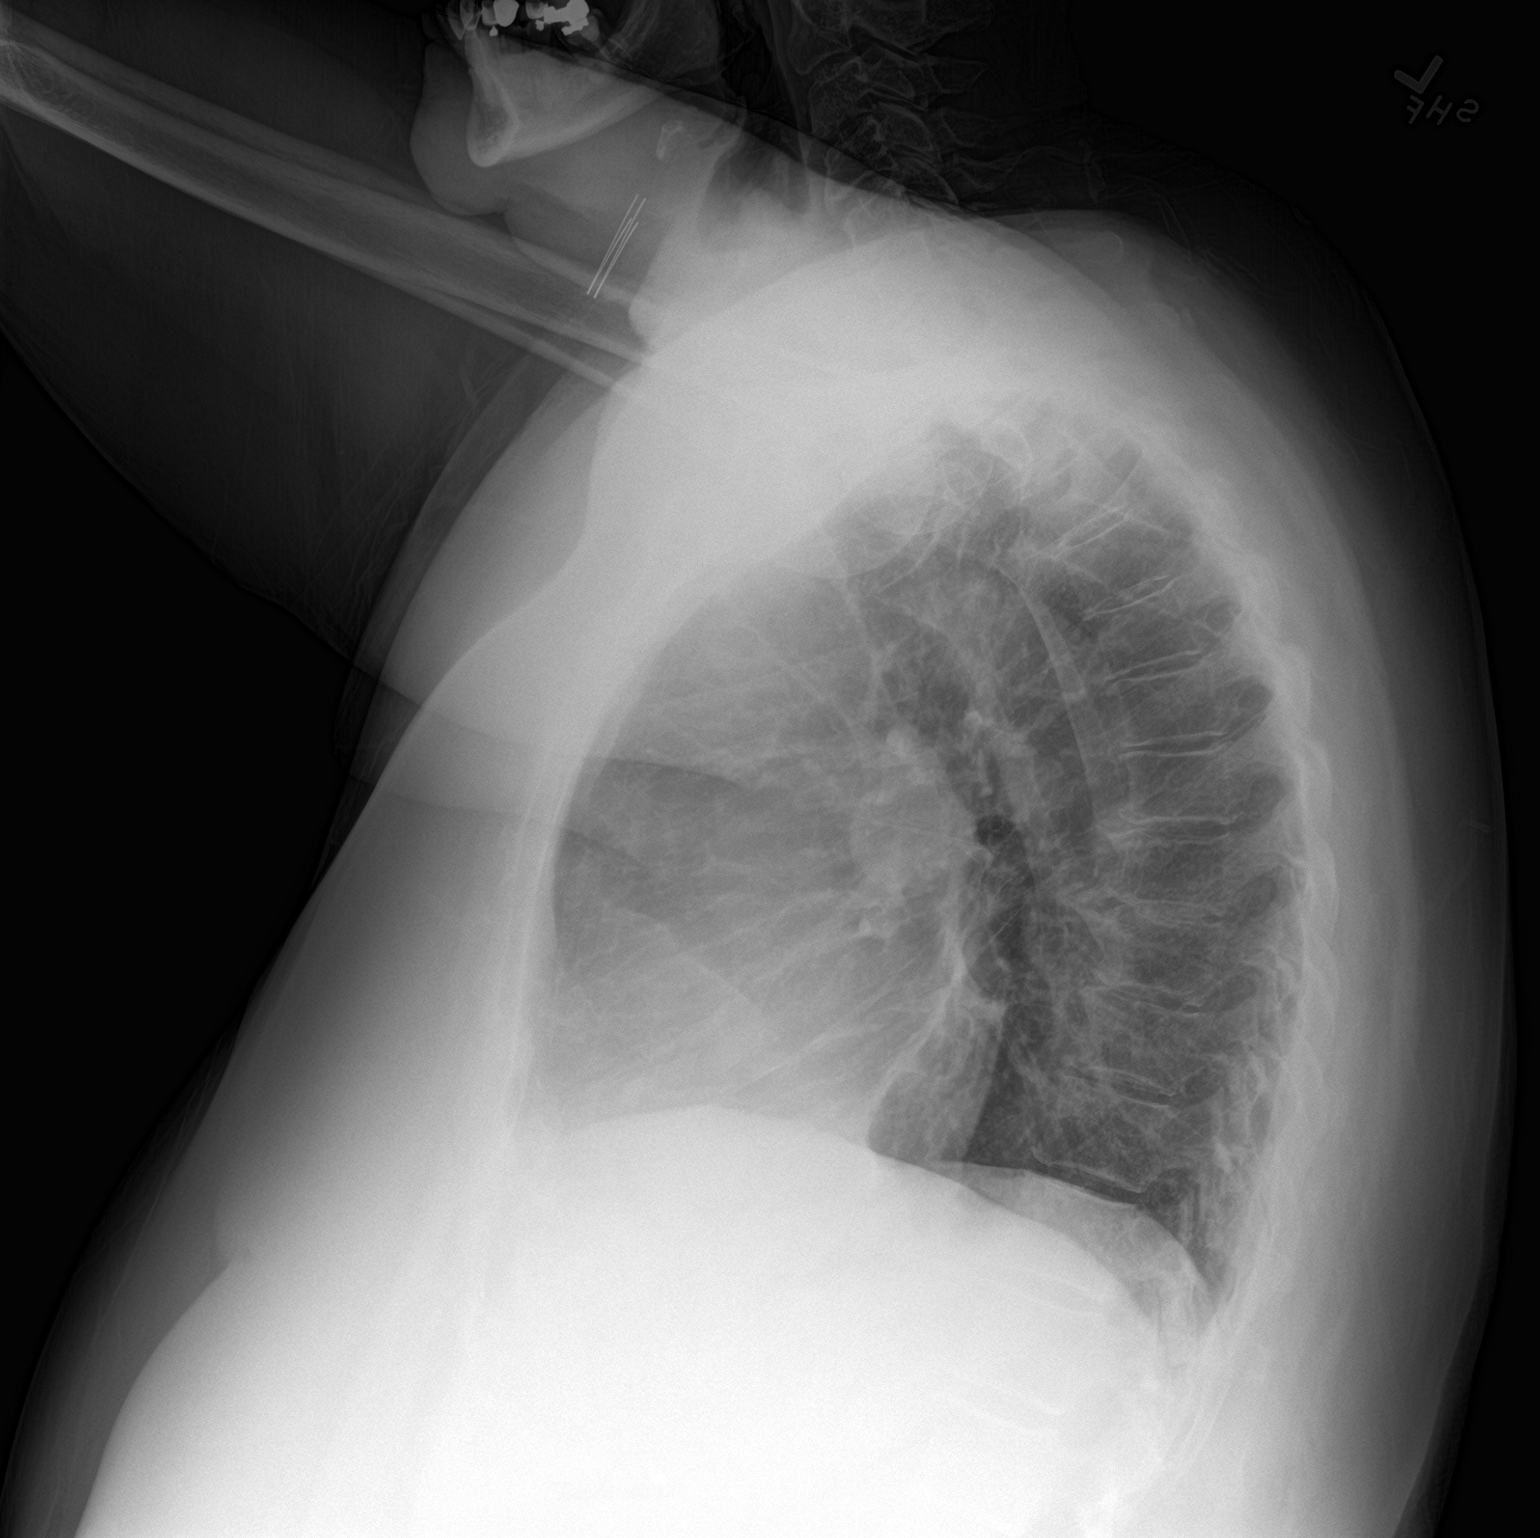

[2 of 2 positions shown; findings below may reference images not displayed]

FINDINGS: The heart size and mediastinal contours are within normal limits. No
pneumothorax or pleural effusion is noted. Right lung is clear.
Multiple ill-defined opacities are noted peripherally in the left
lung concerning for possible multifocal pneumonia. The visualized
skeletal structures are unremarkable.
IMPRESSION: Multiple ill-defined opacities are noted peripherally in the left
lung concerning for possible multifocal pneumonia.

## 2021-05-07 IMAGING — CR DG CHEST 2V
2 series · 2 of 2 positions shown · non-contrast
Comparison: 12/05/2019

CLINICAL DATA: Chest pain and shortness of breath.

EXAM:
CHEST - 2 VIEW

[w chest pa]
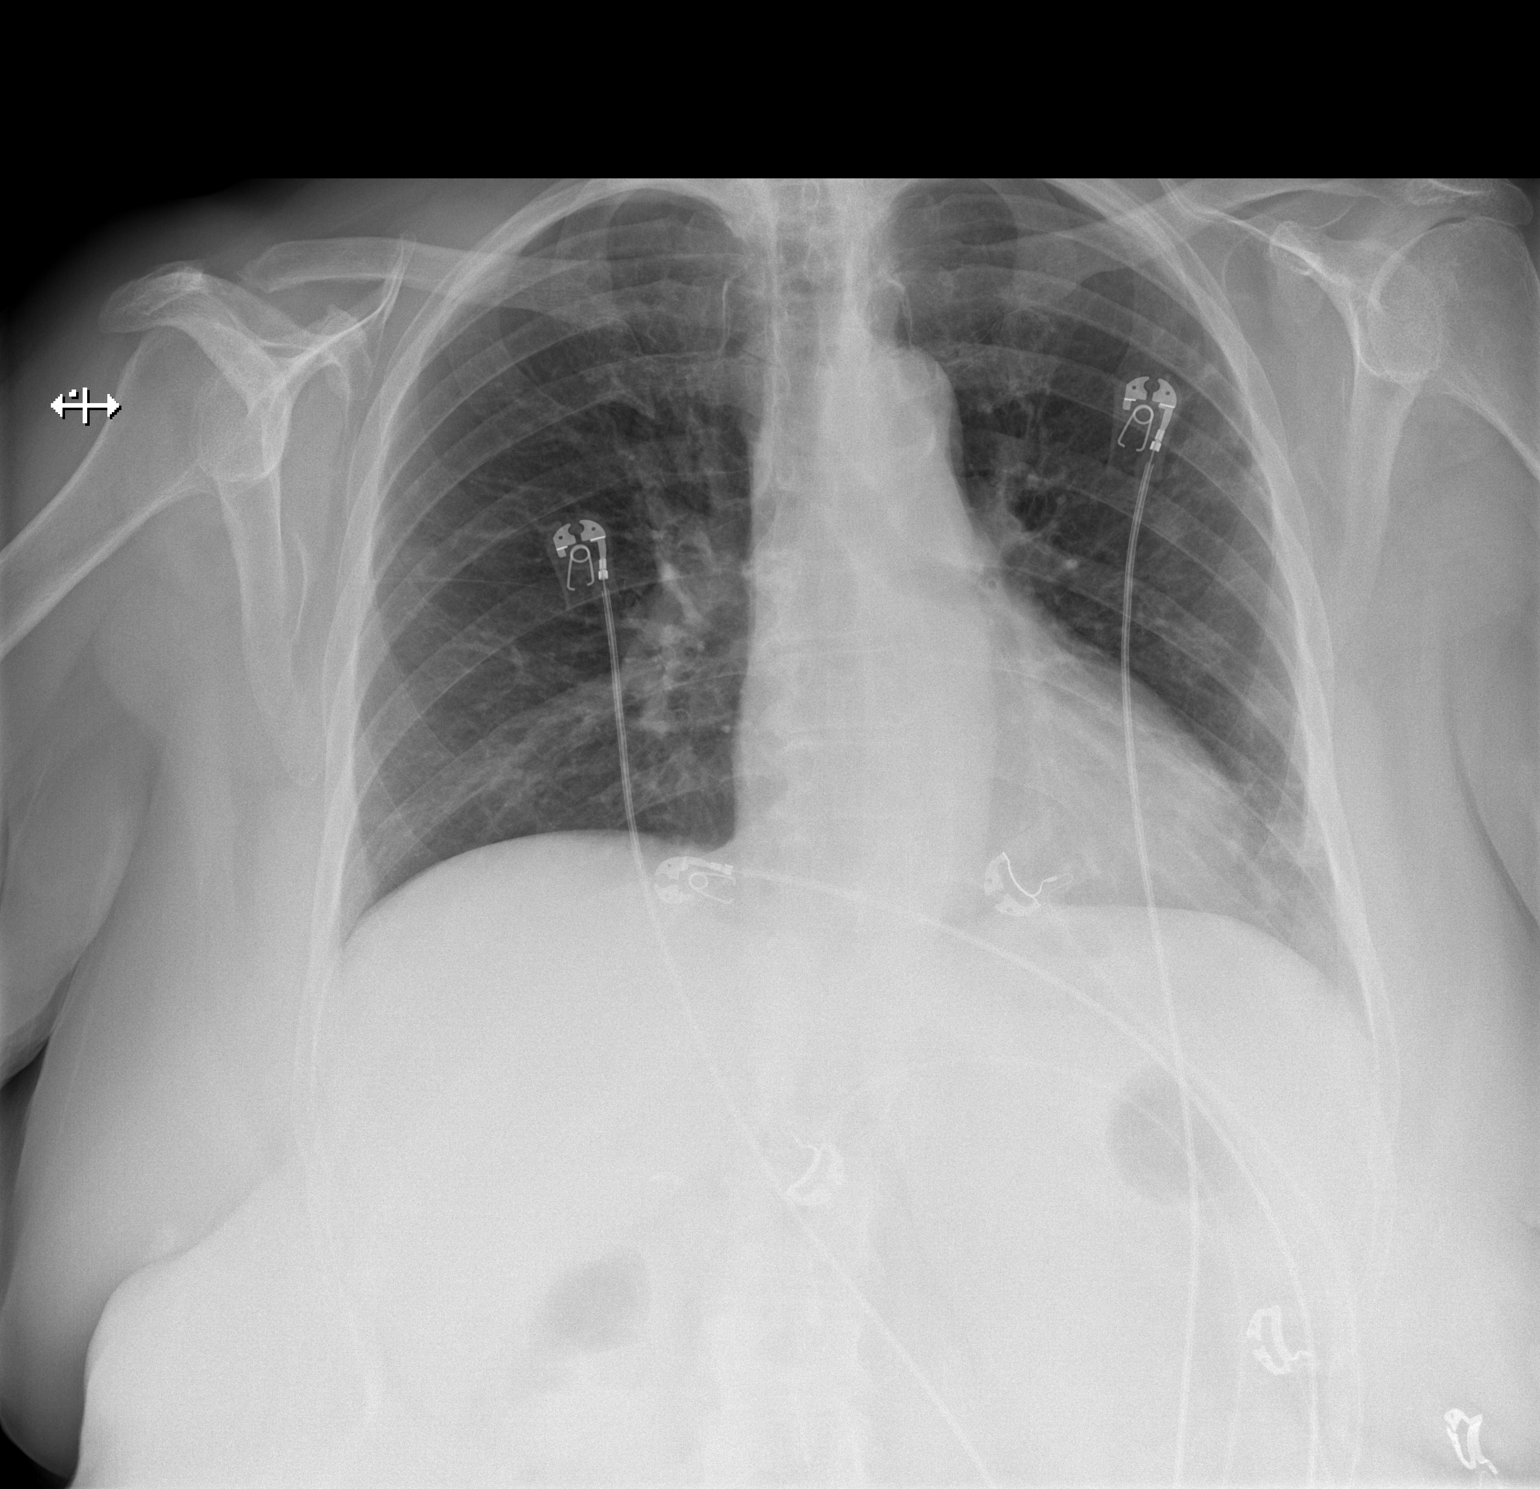

[w chest lat]
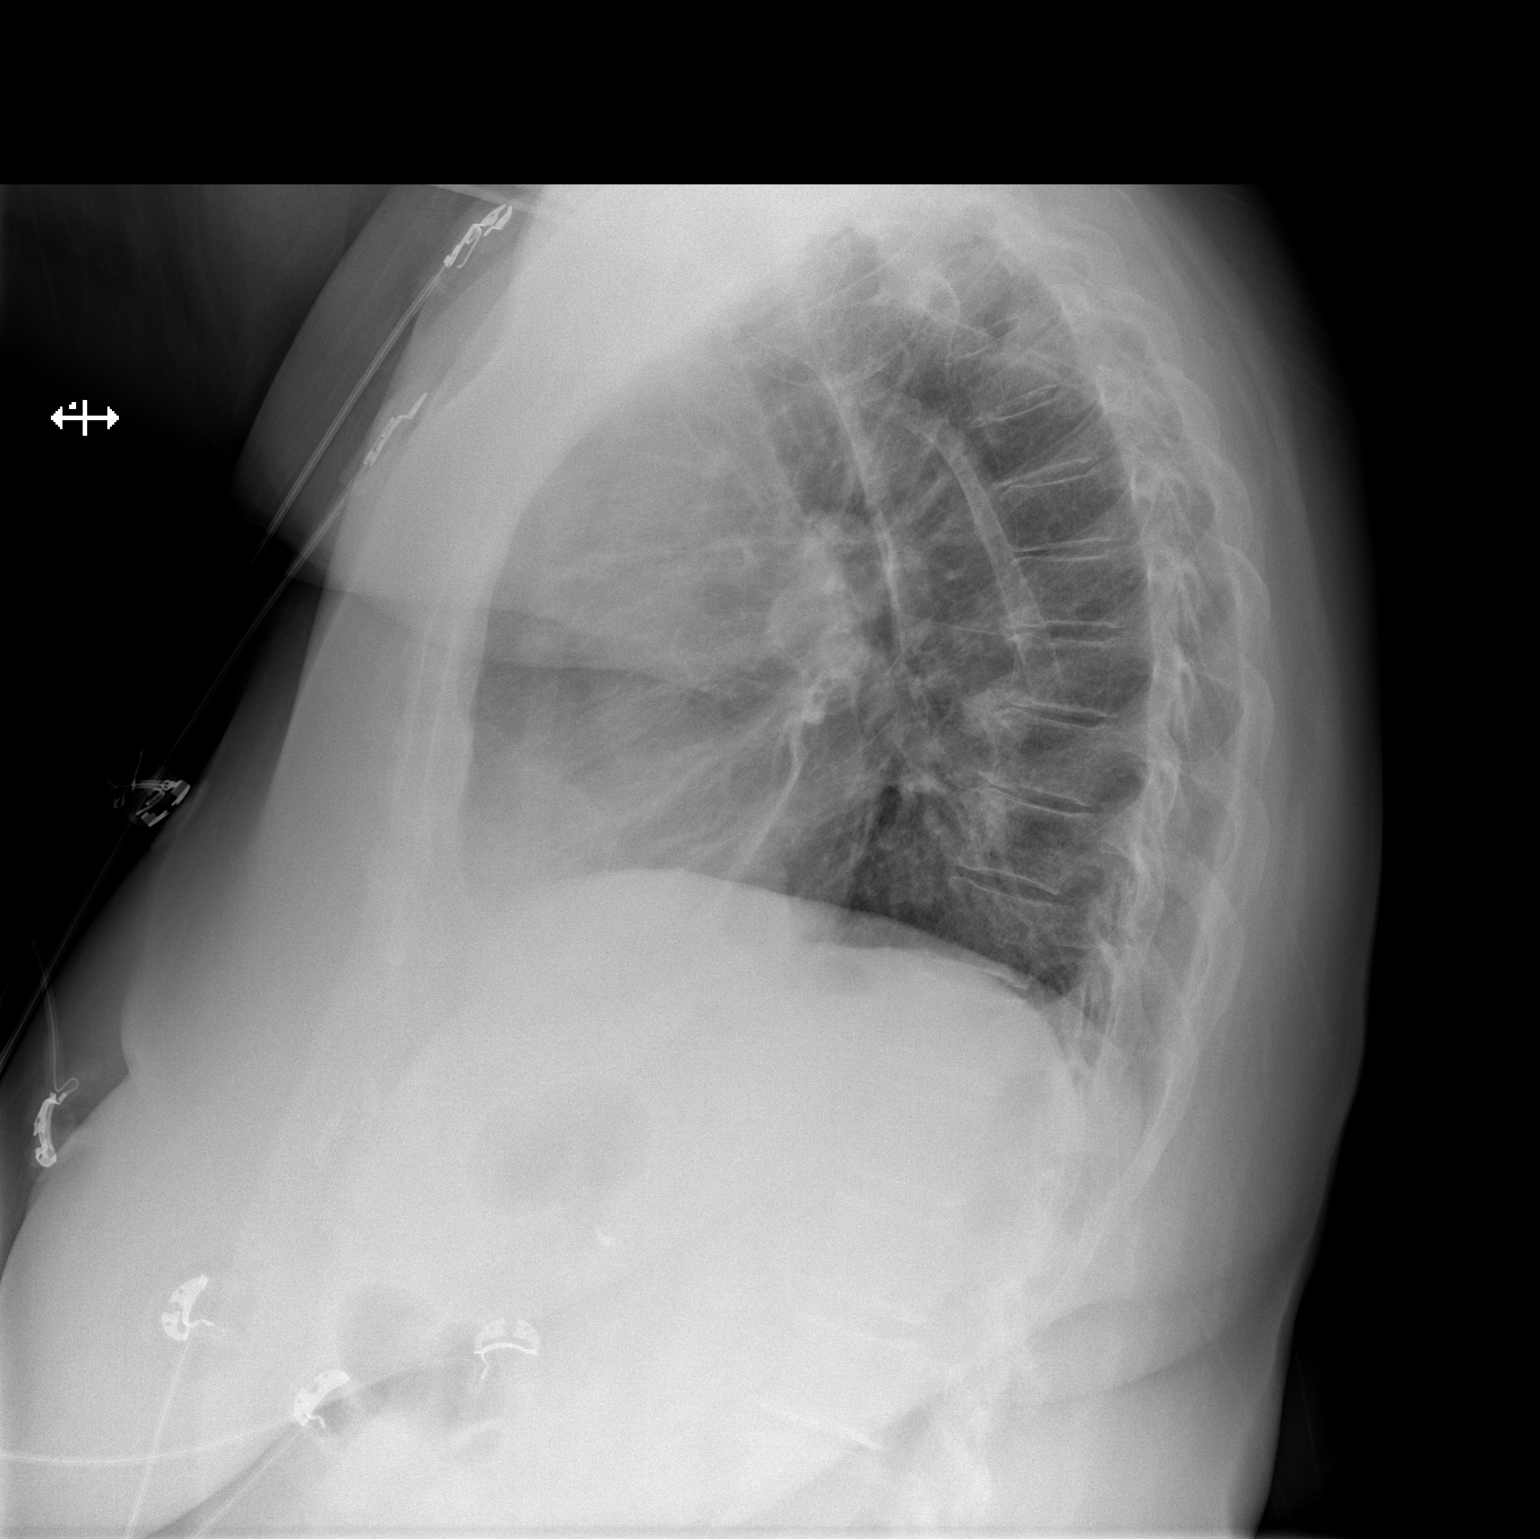

[2 of 2 positions shown; findings below may reference images not displayed]

FINDINGS: The cardiac silhouette, mediastinal and hilar contours are within
normal limits and stable.

Patchy peripheral nodularity is noted as demonstrated on the CT
scan. No focal airspace consolidation or pleural effusion. The bony
thorax is intact.
IMPRESSION: Patchy peripheral nodularity as demonstrated on the CT scan. No
focal airspace consolidation or pleural effusion.

## 2021-05-29 ENCOUNTER — Ambulatory Visit: Payer: Medicare Other | Admitting: Physician Assistant

## 2021-06-05 ENCOUNTER — Ambulatory Visit: Payer: Medicare Other | Admitting: Physician Assistant

## 2021-06-10 ENCOUNTER — Other Ambulatory Visit: Payer: Self-pay | Admitting: Internal Medicine

## 2021-06-11 NOTE — Progress Notes (Deleted)
Cardiology Office Note:    Date:  06/11/2021   ID:  [name], DOB November 15, 1945, MRN [id number]  PCP:  Colon Branch, MD  [facility] Providers Cardiologist:  Candee Furbish, MD { Click to update primary MD,subspecialty MD or APP then REFRESH:1}  *** Referring MD: Colon Branch, MD   Chief Complaint:  No chief complaint on file. {Click here for Visit Info    :1}   Patient Profile: HFimpEF (heart failure with improved ejection fraction)  Prior EF 35 >> Improved to normal Non-ischemic cardiomyopathy  Cath in 2019: no CAD Carotid artery disease Hypertension  Hyperlipidemia  Hx of pulmonary embolism 11/2019 in setting of COVID-19 GERD; Barrett's Esophagus  Aortic atherosclerosis  Prior CV Studies: GATED SPECT MYO PERF W/LEXISCAN STRESS 1D 04/10/2021  Narrative   Findings are consistent with no prior ischemia. The study is low risk.   No ST deviation was noted.   LV perfusion is normal.   Left ventricular function is abnormal. Global function is mildly reduced. Nuclear stress EF: 51 %. The left ventricular ejection fraction is mildly decreased (45-54%). End diastolic cavity size is normal.   Prior study available for comparison. There are changes compared to prior study. The left ventricular ejection fraction has decreased.  Low risk stress nuclear study with normal perfusion and mildly reduced global left ventricular systolic function.    Echocardiogram 12/19/2020 EF 55-60, no RWMA, GR 1 DD, normal RVSF, RVSP 34.4, trivial MR, mild AI   Echo 12/08/2019 EF 60-65   Carotid US 09/29/2017 Bilateral ICA 1-39   Cardiac catheterization 05/06/2017 No CAD EF 30-35 *** History of Present Illness:   [name] is a 76 y.o. female with the above problem list.  She was last seen in Jan 2023 for shortness of breath and chest pain.  A Myoview was neg for ischemia.  She was started on Rosuvastatin due to elevated chol.  BNP was normal.    She returns for f/u.  ***    Past Medical  History:  Diagnosis Date   Allergy    Anemia    Anxiety    Barrett's esophagus    Bilateral carpal tunnel syndrome    Dr. Eddie Dibbles, having injection therapy   C. difficile diarrhea 08/01/2012   severe 2014   Carotid arterial disease ([place])    Cataract    removed both eyes    Chronic combined systolic and diastolic CHF (congestive heart failure) ([place])    Chronic cystitis    Dr. Matilde Sprang   Chronic lower back pain    Chronic pain syndrome    Depression    Dizziness    DJD (degenerative joint disease)    bilateral hands; knees   Family history of anesthesia complication    Mother had severe N/V   Fibromyalgia    GERD (gastroesophageal reflux disease)    H/O cardiac catheterization    (-) cath 12-2002  , cath again 2011 (-)   History of colon polyps    History of nuclear stress test    Myoview 3/23: EF 51, no ischemia; low risk (echocardiogram in 11/22 with normal EF)   HTN (hypertension)    Hyperlipidemia    past hx    IBS (irritable bowel syndrome)    Insomnia    Migraine    Morbid obesity ([place])    Non-ischemic cardiomyopathy ([place])    Osteoporosis    pt unsure of this   RLS (restless legs syndrome)    Vertigo  Vitamin B 12 deficiency 04/09/2013   Current Medications: No outpatient medications have been marked as taking for the 06/12/21 encounter (Appointment) with Richardson Dopp T, PA-C.    Allergies:   Dextromethorphan-guaifenesin, Morphine, Gabapentin, and Penicillins   Social History   Tobacco Use   Smoking status: Former    Packs/day: 0.50    Years: 10.00    Pack years: 5.00    Types: Cigarettes    Quit date: 02/09/1975    Years since quitting: 46.3   Smokeless tobacco: Never   Tobacco comments:    started at age 18.   quit in the 21s.  Vaping Use   Vaping Use: Never used  Substance Use Topics   Alcohol use: Not Currently    Alcohol/week: 0.0 standard drinks    Comment: 2 glasses of wine per year   Drug use: No    Comment: CBD and hemp OIL      Family Hx: The patient's family history includes Breast cancer in her sister; CAD in her father; Colon cancer in her maternal grandfather and maternal uncle; Congestive Heart Failure in her father; Dementia in her father; Diabetes in an other family member; Esophageal cancer in her brother; Hepatitis C in her brother; Lung cancer in her mother; Stroke in her father. There is no history of Rectal cancer, Stomach cancer, or Colon polyps.  ROS   EKGs/Labs/Other Test Reviewed:    EKG:  EKG is *** ordered today.  The ekg ordered today demonstrates ***  Recent Labs: 02/20/2021: ALT 9; BUN 14; Creatinine, Ser 0.89; Hemoglobin 13.3; NT-Pro BNP 271; Platelets 218; Potassium 4.2; Sodium 143   Recent Lipid Panel Recent Labs    02/20/21 1111  CHOL 233*  TRIG 153*  HDL 49  LDLCALC 156*     Risk Assessment/Calculations:   {Does this patient have ATRIAL FIBRILLATION?:[id number]}     Physical Exam:    VS:  There were no vitals taken for this visit.    Wt Readings from Last 3 Encounters:  04/10/21 245 lb (111.1 kg)  03/06/21 248 lb 8 oz (112.7 kg)  02/20/21 245 lb 3.2 oz (111.2 kg)    Physical Exam ***     ASSESSMENT & PLAN:   No problem-specific Assessment & Plan notes found for this encounter. *** Chest pain She notes symptoms of exertional shortness of breath and associated chest heaviness.  These have been fairly chronic symptoms but have worsened over the past couple of months.  She has a prior history of COVID with associated pulmonary embolism.  She saw Dr. Vaughan Browner with pulmonology in July.  She did not require long-term anticoagulation.  Her apixaban was stopped at that visit.  Her D-dimer was negative.  High-resolution CT did demonstrate some parenchymal scarring.  She had no suspicious lung nodules.  She had a heart catheterization in 2019 that demonstrated no CAD.  Her recent CT did demonstrate coronary artery calcification.  Her echocardiogram in 11/22 demonstrated normal LV  function with mild diastolic dysfunction.  Her symptoms are certainly concerning for angina.  She also has relief with inhaled medications.  Question if her symptoms are related to asthma/COPD.  Overall, it is likely that her symptoms are multifactorial and related to obesity, deconditioning, disability from her back and chronic CHF.  She does not seem to have signs of volume excess on exam today. Arrange Lexiscan Myoview to rule out ischemia Obtain CMET, CBC, BNP Rx for as needed nitroglycerin Start furosemide if BNP significantly elevated If cardiac work-up  negative, follow-up with primary care given response to bronchodilators Follow-up 2-3 months   Hyperlipidemia Given coronary artery calcification on CT scan, obtain fasting lipids today.  If LDL over 70, start statin therapy.   Hypertension Blood pressure somewhat elevated.  Continue Entresto 97/103 mg twice daily.  Increase metoprolol succinate to 50 mg daily.   (HFimpEF) heart failure with improved EF EF has been as low as 35.  This is improved to normal.  Her most recent echocardiogram in November 2022 demonstrated EF 55-60.  Her volume status appears stable.  She does describe NYHA III symptoms.  Obtain BNP today.  If her BNP is significantly elevated, I will place her on furosemide.  Continue Entresto 97/103 mg twice daily.  Continue beta-blocker therapy with metoprolol succinate at increased dose of 50 mg daily as outlined.      {Are you ordering a CV Procedure (e.g. stress test, cath, DCCV, TEE, etc)?   Press F2        :[id number]}  Dispo:  No follow-ups on file.   Medication Adjustments/Labs and Tests Ordered: Current medicines are reviewed at length with the patient today.  Concerns regarding medicines are outlined above.  Tests Ordered: No orders of the defined types were placed in this encounter.  Medication Changes: No orders of the defined types were placed in this encounter.  Signed, Richardson Dopp, PA-C  06/11/2021 11:21  PM    [facility] [address], [place], [place]  [address] Phone: [phone]; Fax: [phone]

## 2021-06-12 ENCOUNTER — Ambulatory Visit: Payer: Medicare Other | Admitting: Physician Assistant

## 2021-06-12 DIAGNOSIS — I10 Essential (primary) hypertension: Secondary | ICD-10-CM

## 2021-06-12 DIAGNOSIS — E782 Mixed hyperlipidemia: Secondary | ICD-10-CM

## 2021-06-12 DIAGNOSIS — I5032 Chronic diastolic (congestive) heart failure: Secondary | ICD-10-CM

## 2021-06-15 ENCOUNTER — Other Ambulatory Visit: Payer: Self-pay | Admitting: Gastroenterology

## 2021-06-30 ENCOUNTER — Ambulatory Visit (INDEPENDENT_AMBULATORY_CARE_PROVIDER_SITE_OTHER): Payer: Medicare Other | Admitting: Pharmacist

## 2021-06-30 DIAGNOSIS — Z91148 Patient's other noncompliance with medication regimen for other reason: Secondary | ICD-10-CM

## 2021-06-30 DIAGNOSIS — I5042 Chronic combined systolic (congestive) and diastolic (congestive) heart failure: Secondary | ICD-10-CM

## 2021-06-30 DIAGNOSIS — K2100 Gastro-esophageal reflux disease with esophagitis, without bleeding: Secondary | ICD-10-CM

## 2021-06-30 DIAGNOSIS — E782 Mixed hyperlipidemia: Secondary | ICD-10-CM

## 2021-06-30 DIAGNOSIS — K589 Irritable bowel syndrome without diarrhea: Secondary | ICD-10-CM

## 2021-06-30 DIAGNOSIS — I10 Essential (primary) hypertension: Secondary | ICD-10-CM

## 2021-06-30 NOTE — Patient Instructions (Signed)
[name] It was a pleasure speaking with you  Below is a summary of your health goals and care plan  If you have any questions or concerns, please feel free to contact me either at the phone number below or with a MyChart message.   Keep up the good work!  [name], PharmD Clinical Pharmacist [facility] [phone] (direct line)  [phone] (main office number)  Hypertension / Congestive Heart Failure: Blood pressure goal <140/90  CHF goals: prevent exacerbation of CHF and minimize symptoms of CHF  BP Readings from Last 3 Encounters:  03/06/21 132/84  02/20/21 (!) 142/74  02/08/21 (!) 172/81    current treatment: [name] 97/103 mg twice a day Metoprolol ER '50mg'$  daily  Interventions:   Educated on benefits of Entresto and metoprolol for heart health Counseled to monitor weight and heart rate Coordinated refill for metoprolol ER '50mg'$  take 1 tablet daily   Hyperlipidemia: Last LDL at goal of <100 but lipids last checked in 2017 Lab Results  Component Value Date   CHOL 233 (H) 02/20/2021   HDL 49 02/20/2021   LDLCALC 156 (H) 02/20/2021   LDLDIRECT 166.0 04/14/2010   TRIG 153 (H) 02/20/2021   CHOLHDL 4.8 (H) 02/20/2021   current treatment:  Rosuvastatin '20mg'$  daily  Interventions:  Recommended limit intake of saturated fat and cholesterol Continue to take rosuvastatin  Depression/sleep disturbance: current treatment: Sertraline '50mg'$  - take 1.5 tablets = '75mg'$  daily  Trazodone '50mg'$  - take 1 or 2 tablets at bedtime Interventions:  Continue current therapy  GERD with Barrett's Esophagus / abdominal cramps: Controlled Current therapy:  Omeprazole '40mg'$  twice a day Dicyclomine '20mg'$  take 1 tablet 4 times a day before meal and at bedtime. .  Interventions:  Recommended continue current therapy.   Health Maintenance:  Discussed getting vaccines - Tetanus booster and Shingrix vaccines Considering Tetanus and Shingrix if  covered by insurance. Patient to check with [facility] about cost.   Patient Goals/Self-Care Activities Over the next 90 days, patient will:  take medications as prescribed check with [facility] about cost of Tetanus booster and Shingrix vaccines.  Take metoprolol '50mg'$  - 1 tablet daily per cardiology office. Monitor weight and heart rate daily. Call office if you notice weight gain of more than 3 lbs in 24 hours or 5 lbs in 1 week. Or if heart rate is less than 50 or you experience dizziness.  Call cardiologist office to rescheduled appointment with Richardson Dopp - [phone]  Follow Up Plan: Telephone follow up appointment with care management team member scheduled for:  2 to 3 months.  The patient verbalized understanding of instructions, educational materials, and care plan provided today and agreed to receive a mailed copy of patient instructions, educational materials, and care plan.

## 2021-06-30 NOTE — Chronic Care Management (AMB) (Signed)
Chronic Care Management Pharmacy Note  06/30/2021 Name:  [name] MRN:  [id number] DOB:  08-30-1945  Summary: Reviewed medication with patient and reviewed refill history / assessed adherence. Patient requested list of medications and reason to take. Made chart for patient with timing of doses as well.  Requested needed refills for albuterol inhaler (so she can have 1 in purse and 1 at home) and metoprolol ER 29m . Reminded patient to check blood pressure 2 or 3 times per week.  Also reminded to reschedule appointment with cardiology office.   Discussed Health maintenance - reminded to get Tetanus booster and Shingrix vaccine - should be covered by insurance for 2023.    Subjective: [name] an 76y.o. year old female who is a primary patient of Paz, JAlda Berthold MD.  The CCM team was consulted for assistance with disease management and care coordination needs.    Engaged with patient by telephone for follow up visit in response to provider referral for pharmacy case management and/or care coordination services.   Consent to Services:  The patient was given information about Chronic Care Management services, agreed to services, and gave verbal consent prior to initiation of services.  Please see initial visit note for detailed documentation.   Patient Care Team: PColon Branch MD as PCP - General SJerline Pain MD as PCP - Cardiology (Cardiology) MBjorn Loser MD as Consulting Physician (Urology) PMaia Breslow MD as Consulting Physician (Orthopedic Surgery) KAlden Hipp MD as Consulting Physician (Obstetrics and Gynecology) DPhylliss Bob MD as Consulting Physician (Orthopedic Surgery) ARed Christians MD as Referring Physician (Pain Medicine) SLadene Artist MD as Consulting Physician (Gastroenterology) ECherre Robins R[place](Pharmacist)  Recent office visits: 03/06/2021 - Internal Medicine (Dr PLarose Kells Follow up visit. Refilled albuterol. F/U 6 months. 01/23/2021  - Internal Med (Dr PLarose Kells Acute Visit for cough. Prescribed benxonatate 2037m3 times a day as needed and hydrocodone / Homatripin cough syrup 5 milliliters up to every 6 hours as needed.  11/28/2020 - Internal Med (Dr PaLarose KellsSeen for chronic fatigue.  11/20/2020 - Internal Med (Dr PaLarose KellsVideo for chronic fatigue. No medication changes 10/11/20-Amy Hopkins, LPN. Seen for Medicare Annual Wellness. [facility]B [facility]. Follow up in 1 year.  Recent consult visits: 02/20/2021 - [facility], SOB and chest pain. Started NTG as needed for chest pain; Increased metoprolol from 25 to 5092maily. Added rosuvastatin 70m80mily after lab showed elevated LDL. Ordered nuclear stress test 09/05/20 (Pulmonology) Dr MannConcepcion Livingeen for follow up for post COVID-19. We will check a blood test called D-dimer. If normal then we can stop the Eliquis CT of chest ordered. Follow up in clinic after CT.   [facility]: [facility] C/O headache, nausea. Bronze statue fell on her head around 8:30pm previous day. CT head ordered and was unremarkable. Provided reglan and benadryl in ED with improvements. Discharged and instructed to take acetaminophen, Benadryl and Reglan as needed.   Objective:  Lab Results  Component Value Date   CREATININE 0.89 02/20/2021   CREATININE 0.87 12/02/2020   CREATININE 0.93 08/28/2020    Lab Results  Component Value Date   HGBA1C 5.6 04/15/2020   Last diabetic Eye exam: No results found for: HMDIABEYEEXA  Last diabetic Foot exam: No results found for: HMDIABFOOTEX      Component Value Date/Time   CHOL 233 (H) 02/20/2021 1111  TRIG 153 (H) 02/20/2021 1111   HDL 49 02/20/2021 1111   CHOLHDL 4.8 (H) 02/20/2021 1111   CHOLHDL 2 10/30/2015 1102   VLDL 13.6 10/30/2015 1102   LDLCALC 156 (H) 02/20/2021 1111   LDLDIRECT 166.0 04/14/2010 1539       Latest Ref Rng & Units 02/20/2021    11:11 AM 08/28/2020    9:14 AM 12/07/2019    3:54 PM  Hepatic Function  Total Protein 6.0 - 8.5 g/dL 6.7   7.2   7.3    Albumin 3.7 - 4.7 g/dL 4.2   4.0   3.7    AST 0 - 40 IU/L '16   14   19    ' ALT 0 - 32 IU/L '9   9   14    ' Alk Phosphatase 44 - 121 IU/L 86   82   67    Total Bilirubin 0.0 - 1.2 mg/dL 0.5   0.6   0.8      Lab Results  Component Value Date/Time   TSH 1.89 04/15/2020 02:40 PM   TSH 1.62 12/16/2017 02:16 PM       Latest Ref Rng & Units 02/20/2021   11:11 AM 12/02/2020    7:48 AM 08/28/2020    9:14 AM  CBC  WBC 3.4 - 10.8 x10E3/uL 6.5   6.3   6.2    Hemoglobin 11.1 - 15.9 g/dL 13.3   13.1   13.3    Hematocrit 34.0 - 46.6 % 41.5   39.4   40.7    Platelets 150 - 450 x10E3/uL 218   214   212.0      Lab Results  Component Value Date/Time   VD25OH 44 12/02/2020 07:48 AM   VD25OH 28.12 (L) 04/15/2020 02:40 PM   VD25OH 36 09/30/2010 03:48 PM    Clinical ASCVD: No  The 10-year ASCVD risk score (Arnett DK, et al., 2019) is: 31.3%   Values used to calculate the score:     Age: 76 years     Sex: Female     Is Non-Hispanic African American: No     Diabetic: No     Tobacco smoker: No     Systolic Blood Pressure: 594 mmHg     Is BP treated: Yes     HDL Cholesterol: 49 mg/dL     Total Cholesterol: 233 mg/dL    Other:   DEXA 08/15/2019 AP Spine L1-L3 02/15/2019 74.3 Normal 0.4 1.224 g/cm2 AP Spine L1-L3 10/04/2016 72.0 Normal 0.2 1.190 g/cm2   DualFemur Neck Left 02/15/2019 74.3 Osteopenia -1.8 0.787 g/cm2 DualFemur Neck Left 10/04/2016 72.0 Osteopenia -1.4 0.838 g/cm2   DualFemur Total Mean 02/15/2019 74.3 Osteopenia -1.2 0.860 g/cm2 DualFemur Total Mean 10/04/2016 72.0 Normal -0.8 0.910 g/cm2  FRAX* 10-year Probability of Fracture Based on femoral neck BMD: DualFemur (Left)   Major Osteoporotic Fracture: 10.9% Hip Fracture:                2.3% Population:                  [place] (Caucasian) Risk Factors:                None  Social History   Tobacco Use   Smoking Status Former   Packs/day: 0.50   Years: 10.00   Pack years: 5.00   Types: Cigarettes   Quit date: 02/09/1975   Years since quitting: 46.4  Smokeless Tobacco Never  Tobacco Comments   started at age  21.   quit in the 1970s.   BP Readings from Last 3 Encounters:  03/06/21 132/84  02/20/21 (!) 142/74  02/08/21 (!) 172/81   Pulse Readings from Last 3 Encounters:  03/06/21 (!) 56  02/20/21 66  02/08/21 84   Wt Readings from Last 3 Encounters:  04/10/21 245 lb (111.1 kg)  03/06/21 248 lb 8 oz (112.7 kg)  02/20/21 245 lb 3.2 oz (111.2 kg)    Assessment: Review of patient past medical history, allergies, medications, health status, including review of consultants reports, laboratory and other test data, was performed as part of comprehensive evaluation and provision of chronic care management services.   SDOH:  (Social Determinants of Health) assessments and interventions performed:     CCM Care Plan  Allergies  Allergen Reactions   Dextromethorphan-Guaifenesin Nausea And Vomiting   Morphine Nausea And Vomiting   Gabapentin Other (See Comments)    Dizziness, made her feel loopy   Penicillins Rash    "> 30 years ago; best I can remember it was just a light rash on my arm" Did it involve swelling of the face/tongue/throat, SOB, or low BP? No Did it involve sudden or severe rash/hives, skin peeling, or any reaction on the inside of your mouth or nose? Unknown Did you need to seek medical attention at a hospital or doctor's office? No When did it last happen?      more than 30 years  If all above answers are "NO", may proceed with cephalosporin use.     Medications Reviewed Today     Reviewed by [name], RPH-CPP (Pharmacist) on 06/30/21 at 1034  Med List Status: <None>   Medication Order Taking? Sig Documenting Provider Last Dose Status Informant  albuterol (VENTOLIN HFA) 108 (90 Base) MCG/ACT inhaler [id number] Yes Inhale 2 puffs into the lungs every 6 (six)  hours as needed for wheezing or shortness of breath. Colon Branch, MD Taking Active   Cholecalciferol (VITAMIN D) 50 MCG (2000 UT) CAPS [id number] No Take 4,000 Units by mouth daily.  Patient not taking: Reported on 06/30/2021   [provider] Not Taking Active Multiple Informants  Cyanocobalamin (VITAMIN B12) 500 MCG TABS [id number] No Take 1 tablet by mouth daily.  Patient not taking: Reported on 06/30/2021   [provider] Not Taking Active   dicyclomine (BENTYL) 20 MG tablet [id number] Yes Take 1 tablet (20 mg total) by mouth 4 (four) times daily -  before meals and at bedtime. Ladene Artist, MD Taking Active            Med Note Olena Heckle, [facility]   Fri Feb 20, 2021  9:39 AM)    [name] 97-103 MG [id number] Yes TAKE 1 TABLET TWICE A DAY Jerline Pain, MD Taking Active   HYDROcodone-acetaminophen [name]) 10-325 MG tablet [id number] Yes Take 1 tablet by mouth every 6 (six) hours as needed. [provider] Taking Active   loperamide (IMODIUM) 2 MG capsule [id number] Yes Patient takes 2-4 tablets by mouth as needed for diarrhea or loose stools [provider] Taking Active   metoprolol succinate (TOPROL-XL) 50 MG 24 hr tablet [id number] Yes Take 1 tablet (50 mg total) by mouth daily. Take with or immediately following a meal. Richardson Dopp T, PA-C Taking Active   NARCAN 4 MG/0.1ML LIQD nasal spray kit [id number] No Place 1 spray into the nose once as needed (opioid od).  Patient not taking: Reported on 03/06/2021   [provider] Not Taking Active  Med Note [facility], CARLOS A   Thu Jan 17, 2020  1:27 PM)    nitroGLYCERIN (NITROSTAT) 0.4 MG SL tablet [id number] No Place 1 tablet (0.4 mg total) under the tongue every 5 (five) minutes as needed for chest pain.  Patient not taking: Reported on 03/06/2021   Liliane Shi, [place] Not Taking Expired 05/21/21 2359            Med Note (CANTER, Cheri Rous   Fri Mar 06, 2021  1:01 PM) PRN  omeprazole  (PRILOSEC) 40 MG capsule [id number] Yes TAKE 1 CAPSULE BY MOUTH TWICE A DAY Ladene Artist, MD Taking Active   ondansetron [facility]) 4 MG tablet [id number] Yes Take 1 tablet by mouth 30 minutes ac meals and hs x 3 days then PRN Ladene Artist, MD Taking Active   pregabalin (LYRICA) 75 MG capsule [id number] Yes Take 75 mg by mouth 3 (three) times daily. [provider] Taking Active   promethazine (PHENERGAN) 25 MG tablet [id number] Yes Take 25 mg by mouth every 6 (six) hours as needed for nausea or vomiting. [provider] Taking Active   Propylene Glycol 0.6 % SOLN [id number] Yes Apply 2 drops to eye as needed (for dry eyes). [provider] Taking Active   rOPINIRole (REQUIP) 1 MG tablet [id number] Yes TAKE 1 TABLET BY MOUTH TWICE A DAY Paz, Alda Berthold, MD Taking Active   rosuvastatin (CRESTOR) 20 MG tablet [id number] Yes Take 1 tablet (20 mg total) by mouth daily. Richardson Dopp T, PA-C Taking Active   sertraline (ZOLOFT) 50 MG tablet [id number] Yes TAKE 1 AND 1/2 TABLETS     DAILY Colon Branch, MD Taking Active   traZODone (DESYREL) 50 MG tablet [id number] Yes TAKE 1 TO 2 TABLETS BY MOUTH AT BEDTIME Colon Branch, MD Taking Active             Patient Active Problem List   Diagnosis Date Noted   Carotid artery disease ([place]) 02/19/2021   Pulmonary embolism ([place]) 12/07/2019   Gastric polyps    Fibromyalgia, see office visit note from 02/14/2018 02/15/2018   Paresthesia 12/29/2017   Gait abnormality 09/21/2017   Dizziness 09/21/2017   Raynaud phenomenon 07/22/2017   Nonischemic cardiomyopathy ([place]) 05/18/2017   (HFimpEF) heart failure with improved EF 05/05/2017   Headache 05/05/2017   Chest pain 05/04/2017   Insomnia 10/23/2016   Morbid obesity ([place]) 12/16/2015   Myalgia 07/10/2015   Hyperlipidemia 07/10/2015   PCP NOTES >>>>>>>>>>>>>>>>> 10/31/2014   Anxiety and depression 09/10/2013   Vitamin B 12 deficiency 04/09/2013   Chronic cystitis 05/29/2012   Annual  physical exam 06/14/2011   PARESTHESIA 04/01/2009   DJD (degenerative joint disease) 10/21/2008   BARRETTS ESOPHAGUS 07/19/2007   RESTLESS LEGS SYNDROME 05/11/2007   Migraine-- on topamax, rx by welness center 07/27/2006   Hypertension 07/27/2006   Osteopenia 07/27/2006   Chronic fatigue 07/27/2006    Immunization History  Administered Date(s) Administered   Fluad Quad(high Dose 65+) 10/31/2018   Influenza Split 02/17/2011   Influenza Whole 01/14/2010   Influenza, High Dose Seasonal PF 01/24/2015, 12/16/2017   Influenza,inj,Quad PF,6+ Mos 10/22/2013   Influenza-Unspecified 03/13/2016, 10/20/2016   Pneumococcal Conjugate-13 09/10/2013   Pneumococcal Polysaccharide-23 04/14/2010, 08/21/2020   Td 02/08/1998, 04/14/2010   Zoster, Live 12/06/2013    Conditions to be addressed/monitored: CHF, HTN, HLD, Depression, and history of PE - related to [place]; osteopenia; GERD with Barrett's esophagus; neuropathy; fibromylagia  Care Plan : General Pharmacy (Adult)  Updates made by [name], RPH-CPP since 06/30/2021 12:00 AM     Problem: Chronic Conditions: Heart failure, hypertension, Irritable bowel syndrome; Restless leg syndrome, depression, insomnia, osteopenia, chronic pain, fibromyalgia, GERD with Barrett's esophagus   Priority: High  Onset Date: 10/21/2020     Goal: Medication and Chronic Care Management with improve patient knowledge about disease states, treatment and goals and improved adhernece   Start Date: 10/21/2020  This Visit's Progress: On track  Priority: High  Note:   Current Barriers:  Unable to independently monitor therapeutic efficacy Does not adhere to prescribed medication regimen (improving)  Transportation - son takes patient to appointments and picks up medications  Pharmacist Clinical Goal(s):  Over the next 90 days, patient will achieve adherence to monitoring guidelines and medication adherence to achieve therapeutic efficacy maintain control of  hypertension, acid reflux and mood as evidenced by maintaining goals listed below  adhere to prescribed medication regimen as evidenced by refill history  through collaboration with PharmD and provider.   Interventions: 1:1 collaboration with Colon Branch, MD regarding development and update of comprehensive plan of care as evidenced by provider attestation and co-signature Inter-disciplinary care team collaboration (see longitudinal plan of care) Comprehensive medication review performed; medication list updated in electronic medical record  Hypertension / Congestive Heart Failure: Blood pressure has been variable Blood pressure goal <140/90  CHF goals: prevent exacerbation of CHF and minimize symptoms of CHF Managed by cardiologist Dr Marlou Porch  HF with improved EF Noted to have systolic-diastolic heart failure / non ischemic cardiomyopathy EF was 35% initially; Most recent EF was 60-65% on 12/08/2019  BP Readings from Last 3 Encounters:  03/06/21 132/84  02/20/21 (!) 142/74  02/08/21 (!) 172/81  current treatment: [name] 97/103 mg twice a day Metoprolol ER 75m - take 1 tablet daily  Metoprolol dose was increased from 2101mdaily to 5076maily at appointment with cardiology 02/20/2021 - has not filled new dose yet.  Current home blood pressure readings: not currently checking blood pressure at home. Patient report she has bought 2 blood pressure cuffs in past and both broke so she does not want to purchase another Denies hypotensive or hypertensive symptoms Interventions:   Educated on benefits of Entresto and metoprolol for heart health Counseled on monitor weight and heart rate Coordinated with pharmacy to  fill Rx for metoprolol ER 63m81mily   Hyperlipidemia: Uncontrolled; recent CT of lung noted aortic athlesclerolsis on 11/17/2020. LDL goal of <70 Lab Results  Component Value Date   LDLCALC 156 (H) 02/20/2021   LDLCALC 74 10/30/2015   LDLCALC 158 (H) 09/18/2015  current  treatment:  Rosuvastatin 20mg32mly (started 02/23/2021)  Medications previously tried: pravastatin, rosuvastatin (both noted to cause myalgias); ezetimibe tried in 2018 - caused joint pain  Per patient she is tolerating rosuvastatin so far. Denies new myalgias Patient has stress test scheduled for 3/2 and 3/3 Interventions:  Recommended limit intake of saturated fat and cholesterol Continue rosuvastatin and plan to recheck lipids in June 2023. Discussed non statin options for treating cholesterol / lower CVD risk. Patient is open to consider. (Addressed at previous visit) [place] but requires prior authorization (estimated cost $50/30 days) Nexletol is tier 3 but requires prior authorization (estimated cost 60% of med cost)  Depression/sleep disturbance: Controlled;  current treatment: Sertraline 63mg 76mke 1.5 tablets = 75mg d58m Trazodone 63mg - 31m 1 or 2 tablets at bedtime Past medications tried: escitalopram / Lexapro  in 2015; Effexor At last visit patient was only taking sertraline 1 tablet daily; has since restarted taking at prescribed dose.  Interventions:  Recommended continue sertraline 1.5 tablets daily. Continue current therapy  GERD with Barrett's Esophagus / abdominal cramps: Controlled Current therapy:  Omeprazole 68m twice a day Dicyclomine 231mtake 1 tablet 4 times a day before meal and at bedtime.  Patient reports rare reflux symptoms since starting omeprazole. Usually occurs when [place] Rarely has abdominal cramping.  Interventions:  Recommended continue current therapy.   Health Maintenance:  Discussed getting vaccines - Tetanus booster and Shingrix vaccines Considering Tetanus and Shingrix if covered by insurance. Patient to check with [facility] about cost.   Patient Goals/Self-Care Activities Over the next 90 days, patient will:  take medications as prescribed check with [facility]  about cost of Tetanus booster and Shingrix vaccines.  Take metoprolol 5036m 1 tablet daily per cardiology office. Monitor weight and heart rate daily. Call office if you notice weight gain of more than 3 lbs in 24 hours or 5 lbs in 1 week. Or if heart rate is less than 50 or you experience dizziness.  Call cardiologist office to rescheduled appointment with ScoRichardson [phone] Up Plan: Telephone follow up appointment with care management team member scheduled for:  2 to 3 months           Medication Assistance: None required.  Patient affirms current coverage meets needs.  Patient's [facility] pharmacy [facility]:  [id number] [place] [name]: [phone]: [phone] [facility] [facility] [place] [address] [id number]: [phone]: [phone] Up:  Patient agrees to Care Plan and Follow-up.  Plan: Telephone follow up appointment with care management team member scheduled for:  2 to 3 months  TammCherre RobinsarmD Clinical Pharmacist [facility] [facility]

## 2021-07-08 DIAGNOSIS — I110 Hypertensive heart disease with heart failure: Secondary | ICD-10-CM

## 2021-07-08 DIAGNOSIS — Z87891 Personal history of nicotine dependence: Secondary | ICD-10-CM | POA: Diagnosis not present

## 2021-07-08 DIAGNOSIS — E785 Hyperlipidemia, unspecified: Secondary | ICD-10-CM

## 2021-07-08 DIAGNOSIS — F32A Depression, unspecified: Secondary | ICD-10-CM

## 2021-07-08 DIAGNOSIS — I509 Heart failure, unspecified: Secondary | ICD-10-CM

## 2021-08-26 ENCOUNTER — Other Ambulatory Visit: Payer: Self-pay | Admitting: Internal Medicine

## 2021-09-04 ENCOUNTER — Ambulatory Visit (INDEPENDENT_AMBULATORY_CARE_PROVIDER_SITE_OTHER): Payer: Medicare Other | Admitting: Internal Medicine

## 2021-09-04 ENCOUNTER — Encounter: Payer: Self-pay | Admitting: Internal Medicine

## 2021-09-04 VITALS — BP 136/86 | HR 64 | Temp 98.4°F | Resp 18 | Ht 66.0 in | Wt 246.5 lb

## 2021-09-04 DIAGNOSIS — E782 Mixed hyperlipidemia: Secondary | ICD-10-CM | POA: Diagnosis not present

## 2021-09-04 DIAGNOSIS — R32 Unspecified urinary incontinence: Secondary | ICD-10-CM | POA: Diagnosis not present

## 2021-09-04 DIAGNOSIS — R5382 Chronic fatigue, unspecified: Secondary | ICD-10-CM | POA: Diagnosis not present

## 2021-09-04 DIAGNOSIS — I10 Essential (primary) hypertension: Secondary | ICD-10-CM

## 2021-09-04 DIAGNOSIS — R109 Unspecified abdominal pain: Secondary | ICD-10-CM | POA: Diagnosis not present

## 2021-09-04 LAB — LIPID PANEL
Cholesterol: 144 mg/dL (ref 0–200)
HDL: 55.7 mg/dL (ref >39.00)
LDL Cholesterol: 62 mg/dL (ref 0–99)
NonHDL: 88.22
Total CHOL/HDL Ratio: 3
Triglycerides: 132 mg/dL (ref 0.0–149.0)
VLDL: 26.4 mg/dL (ref 0.0–40.0)

## 2021-09-04 LAB — URINALYSIS, ROUTINE W REFLEX MICROSCOPIC
Bilirubin Urine: NEGATIVE
Hgb urine dipstick: NEGATIVE
Ketones, ur: NEGATIVE
Nitrite: POSITIVE — AB
Specific Gravity, Urine: >=1.03 — AB (ref 1.000–1.030)
Urine Glucose: NEGATIVE
Urobilinogen, UA: 0.2 (ref 0.0–1.0)
pH: 5.5 (ref 5.0–8.0)

## 2021-09-04 LAB — COMPREHENSIVE METABOLIC PANEL
ALT: 10 U/L (ref 0–35)
AST: 14 U/L (ref 0–37)
Albumin: 4.3 g/dL (ref 3.5–5.2)
Alkaline Phosphatase: 79 U/L (ref 39–117)
BUN: 19 mg/dL (ref 6–23)
CO2: 29 mEq/L (ref 19–32)
Calcium: 9 mg/dL (ref 8.4–10.5)
Chloride: 103 mEq/L (ref 96–112)
Creatinine, Ser: 1.01 mg/dL (ref 0.40–1.20)
GFR: 53.91 mL/min — ABNORMAL LOW (ref >60.00)
Glucose, Bld: 95 mg/dL (ref 70–99)
Potassium: 4.9 mEq/L (ref 3.5–5.1)
Sodium: 140 mEq/L (ref 135–145)
Total Bilirubin: 0.7 mg/dL (ref 0.2–1.2)
Total Protein: 7.2 g/dL (ref 6.0–8.3)

## 2021-09-04 LAB — CBC WITH DIFFERENTIAL/PLATELET
Basophils Absolute: 0 10*3/uL (ref 0.0–0.1)
Basophils Relative: 0.4 % (ref 0.0–3.0)
Eosinophils Absolute: 0 10*3/uL (ref 0.0–0.7)
Eosinophils Relative: 0.7 % (ref 0.0–5.0)
HCT: 38.3 % (ref 36.0–46.0)
Hemoglobin: 12.8 g/dL (ref 12.0–15.0)
Lymphocytes Relative: 28.4 % (ref 12.0–46.0)
Lymphs Abs: 1.9 10*3/uL (ref 0.7–4.0)
MCHC: 33.4 g/dL (ref 30.0–36.0)
MCV: 89.8 fl (ref 78.0–100.0)
Monocytes Absolute: 0.4 10*3/uL (ref 0.1–1.0)
Monocytes Relative: 6.1 % (ref 3.0–12.0)
Neutro Abs: 4.2 10*3/uL (ref 1.4–7.7)
Neutrophils Relative %: 64.4 % (ref 43.0–77.0)
Platelets: 191 10*3/uL (ref 150.0–400.0)
RBC: 4.27 Mil/uL (ref 3.87–5.11)
RDW: 14.4 % (ref 11.5–15.5)
WBC: 6.5 10*3/uL (ref 4.0–10.5)

## 2021-09-04 MED ORDER — SHINGRIX 50 MCG/0.5ML IM SUSR: 0.5000 mL | Freq: Once | INTRAMUSCULAR | 1 refills | Status: AC

## 2021-09-04 MED ORDER — TETANUS-DIPHTH-ACELL PERTUSSIS 5-2.5-18.5 LF-MCG/0.5 IM SUSP: 0.5000 mL | Freq: Once | INTRAMUSCULAR | 0 refills | Status: AC

## 2021-09-04 NOTE — Progress Notes (Unsigned)
Subjective:    Patient ID: [name], female    DOB: 16-Feb-1945, 76 y.o.   MRN: [id number]  DOS:  09/04/2021 Type of visit - description: Follow-up General feels about the same.    Continued chronic fatigue.  She did report urinary incontinence, day or night.  Low back pain is at baseline, no stool incontinence.  Denies dysuria, gross hematuria, difficulty urinating.  Takes B12 supplements.  Had no stomach problems until earlier this week when she had a episode of diarrhea that is now largely resolved.  Some epigastric pain this morning. No blood in the stools.  No fever or chills.  Also reports easy bruising, again denies blood in the urine or in the stools.  Denies nosebleeds.  Review of Systems See above   Past Medical History:  Diagnosis Date   Allergy    Anemia    Anxiety    Barrett's esophagus    Bilateral carpal tunnel syndrome    Dr. Eddie Dibbles, having injection therapy   C. difficile diarrhea 08/01/2012   severe 2014   Carotid arterial disease ([place])    Cataract    removed both eyes    Chronic combined systolic and diastolic CHF (congestive heart failure) ([place])    Chronic cystitis    Dr. Matilde Sprang   Chronic lower back pain    Chronic pain syndrome    Depression    Dizziness    DJD (degenerative joint disease)    bilateral hands; knees   Family history of anesthesia complication    Mother had severe N/V   Fibromyalgia    GERD (gastroesophageal reflux disease)    H/O cardiac catheterization    (-) cath 12-2002  , cath again 2011 (-)   History of colon polyps    History of nuclear stress test    Myoview 3/23: EF 51, no ischemia; low risk (echocardiogram in 11/22 with normal EF)   HTN (hypertension)    Hyperlipidemia    past hx    IBS (irritable bowel syndrome)    Insomnia    Migraine    Morbid obesity ([place])    Non-ischemic cardiomyopathy ([place])    Osteoporosis    pt unsure of this   RLS (restless legs syndrome)    Vertigo    Vitamin B 12 deficiency  04/09/2013    Past Surgical History:  Procedure Laterality Date   Arm surgery Left    "don't remember what they did; arm wasn't broken"   BILATERAL KNEE ARTHROSCOPY Bilateral    BIOPSY  08/07/2018   Procedure: BIOPSY;  Surgeon: Ladene Artist, MD;  Location: [facility];  Service: Endoscopy;;   CARDIAC CATHETERIZATION  01/22/10   clean cath   CATARACT EXTRACTION W/ INTRAOCULAR LENS  IMPLANT, BILATERAL Bilateral    CHOLECYSTECTOMY N/A 10/25/2016   Procedure: LAPAROSCOPIC CHOLECYSTECTOMY;  Surgeon: Georganna Skeans, MD;  Location: [facility];  Service: General;  Laterality: N/A;   COLONOSCOPY     COLONOSCOPY W/ POLYPECTOMY     ESOPHAGOGASTRODUODENOSCOPY (EGD) WITH PROPOFOL N/A 08/07/2018   Procedure: ESOPHAGOGASTRODUODENOSCOPY (EGD) WITH PROPOFOL;  Surgeon: Ladene Artist, MD;  Location: [facility];  Service: Endoscopy;  Laterality: N/A;   FOOT SURGERY Bilateral    toenails removed; callus removed on right; hammertoes right"   Knot     "removed from right neck; not a goiter"   LUMBAR DISC SURGERY     L5 S1 anterior fusion   OOPHORECTOMY     RIGHT/LEFT HEART CATH AND CORONARY ANGIOGRAPHY N/A  05/06/2017   Procedure: RIGHT/LEFT HEART CATH AND CORONARY ANGIOGRAPHY;  Surgeon: Nelva Bush, MD;  Location: [facility];  Service: Cardiovascular;  Laterality: N/A;   SHOULDER ARTHROSCOPY Right    TOTAL KNEE ARTHROPLASTY  10/05/2011   Procedure: TOTAL KNEE ARTHROPLASTY;  Surgeon: Hessie Dibble, MD;  Location: [facility];  Service: Orthopedics;  Laterality: Right;   UPPER GASTROINTESTINAL ENDOSCOPY     VAGINAL HYSTERECTOMY     for endometriosis    Current Outpatient Medications  Medication Instructions   albuterol (VENTOLIN HFA) 108 (90 Base) MCG/ACT inhaler 2 puffs, Inhalation, Every 6 hours PRN   Cyanocobalamin (VITAMIN B12) 500 MCG TABS 1 tablet, Daily   dicyclomine (BENTYL) 20 mg, Oral, 3 times daily before meals & bedtime   ENTRESTO 97-103 MG TAKE 1 TABLET TWICE A DAY    HYDROcodone-acetaminophen (NORCO) 10-325 MG tablet 1 tablet, Oral, Every 6 hours PRN   loperamide (IMODIUM) 2 MG capsule Patient takes 2-4 tablets by mouth as needed for diarrhea or loose stools   metoprolol succinate (TOPROL-XL) 50 mg, Oral, Daily, Take with or immediately following a meal.   NARCAN 4 MG/0.1ML LIQD nasal spray kit 1 spray, Once PRN   nitroGLYCERIN (NITROSTAT) 0.4 mg, Sublingual, Every 5 min PRN   omeprazole (PRILOSEC) 40 MG capsule TAKE 1 CAPSULE BY MOUTH TWICE A DAY   ondansetron (ZOFRAN) 4 MG tablet Take 1 tablet by mouth 30 minutes ac meals and hs x 3 days then PRN   pregabalin (LYRICA) 75 mg, Oral, 3 times daily   promethazine (PHENERGAN) 25 mg, Oral, Every 6 hours PRN   Propylene Glycol 0.6 % SOLN 2 drops, Ophthalmic, As needed   rOPINIRole (REQUIP) 1 MG tablet TAKE 1 TABLET BY MOUTH TWICE A DAY   rosuvastatin (CRESTOR) 20 mg, Oral, Daily   sertraline (ZOLOFT) 75 mg, Oral, Daily   Tdap (BOOSTRIX) 5-2.5-18.5 LF-MCG/0.5 injection 0.5 mLs, Intramuscular,  Once   traZODone (DESYREL) 50 MG tablet TAKE 1 TO 2 TABLETS BY MOUTH AT BEDTIME   Vitamin D 4,000 Units, Daily   Zoster Vaccine Adjuvanted (SHINGRIX) injection 0.5 mLs, Intramuscular,  Once       Objective:   Physical Exam BP 136/86   Pulse 64   Temp 98.4 F (36.9 C) (Oral)   Resp 18   Ht '5\' 6"'  (1.676 m)   Wt 246 lb 8 oz (111.8 kg)   SpO2 98%   BMI 39.79 kg/m  General:   Well developed, NAD, BMI noted.  HEENT:  Normocephalic . Face symmetric, atraumatic Lungs:  CTA B Normal respiratory effort, no intercostal retractions, no accessory muscle use. Heart: RRR,  no murmur.  Abdomen:  Not distended, soft, mild tenderness without mass or rebound at the left upper and right side of the abdomen.  No periumbilical discomfort.    Skin: Not pale. Not jaundice Lower extremities: no pretibial edema bilaterally  Neurologic:  alert & oriented X3.  Speech normal, gait appropriate for age and unassisted Psych--   Cognition and judgment appear intact.  Cooperative with normal attention span and concentration.  Behavior appropriate. No anxious or depressed appearing.     Assessment     Assessment   HTN Hyperlipidemia-  Pravachol, : Myalgias. Intolerant to Zetia (joint aches), see OV  06/2016  Depression, Anxiety (on clonazepam), insomnia (on trazodone):  depression x many  years, remotely on zoloft and other meds per psych, I rx lexapro 2015, then was rx effexor x a while Morbid obesity CV: CHF/ non  ischemic cardiomyopathy dx 04-2017; cath normal coronaries, EF ~ 30% Carotid artery disease: 1 to 39% bilateral carotids 09-2016, start aspirin per neurology GI:  --GERD, Barrett's esophagus, had a EGD 07/2018  --h/o persistent C. difficile diarrhea 2014 --Cholecystectomy B12 deficiency Osteopenia: Dexa 2015 showed osteopenia, T score -1.9 (10/2016): tscore 2021 - 1.8; Rx cs, vit D PULM: --NPSG 2008:  No osa, PLMS 4/hr with arousal and awakening --Home sleep study 11-2020 mild sleep apnea, further advised per pulmonary. --RLS - Requip  Migraines, f/u [facility] -- off topamax as off 06-2016 MSK: --Back pain, chronic: pain meds rx by ortho, Workers comp MD @ [employer] pain mngmt [place] --DJD,CTS B ([facility]) --Fibromyalgia.  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  Took anticoagulants until 08-2020.  PLAN HTN: BP today satisfactory, checks occasionally BPs at home.  Continue Entresto, metoprolol, check a CMP. High cholesterol: She is currently tolerating Crestor, check labs, refill with results. Fatigue: Chronic issue, at baseline. CHF, nonischemic cardiomyopathy.  No evidence of volume overload today. GI: No symptoms until the last few days when she developed diarrhea x1, she is a slightly tender on exam.  No fever or chills.  No blood in the stools.  Check a CBC. Urinary  incontinence: As described above with no increase in low back pain which is chronic.  We will check a UA, urine culture and refer to gynecology. Continue dependence. Vitamin deficiencies: Recommend to continue supplements Preventive care reviewed, see separate documentation.     -Td  2012: Rx printed. - PNM 23: 2012, 2022. - PNM 13: 2015; - s/p zostavax, shingrix rx printed  -Has consistently declined COVID vaccines. Flu shot yearly recommended. CCS: Colonoscopy 03-2006,  Colonoscopy 10-11 , essentially negative  Colonoscopy 11- 2016, next per GI (seen by GI 08-2020) Female care  PAPS not in long time.  We will refer her to gynecology due to to urinary incontinence.  MMG 01/2021 (K PN) ACP information provided     Fatigue: See visit from October 2022.  Symptoms are at baseline Chronic SOB/CP  Saw cardiology 02/20/2021, was seen with chronic SOB/CP, they Rx stress test (pending). proBNP, CMP, CBC were okay. Hyperlipidemia: Cardiology Rx Crestor few days ago, so far tolerating it well. Suspected IBS:   CT abdomen and pelvis November 2022: Negative for a possible etiology for GI symptoms. Anxiety, depression, insomnia: Continue sertraline and trazodone B12 deficiency: Last levels very good Vitamin D deficiency: Last level good. Refill albuterol RTC 6 months

## 2021-09-04 NOTE — Patient Instructions (Addendum)
You have urinary incontinence.  Continue using depends. We are referring you to a gynecologist. Also let your back doctor know about this problem.   Recommend to proceed with the following vaccines at your pharmacy:  Shingrix (shingles) Tdap (tetanus) Flu shot this fall  Continue taking daily vitamin D and vitamin B12 supplements  You have some stomach pain today, if is not gradually better please reach out to your gastroenterologist.  If symptoms are severe: Seek medical advice.   GO TO THE LAB : Get the blood work     [place], [place] back for a checkup in 6 months   "Living will", "Max of attorney": Advanced care planning  (If you already have a living will or healthcare power of attorney, please bring the copy to be scanned in your chart.)  Advance care planning is a process that supports adults in  understanding and sharing their preferences regarding future medical care.   The patient's preferences are recorded in documents called Advance Directives.    Advanced directives are completed (and can be modified at any time) while the patient is in full mental capacity.   The documentation should be available at all times to the patient, the family and the healthcare providers.  Bring in a copy to be scanned in your chart is an excellent idea and is recommended   This legal documents direct treatment decision making and/or appoint a surrogate to make the decision if the patient is not capable to do so.    Advance directives can be documented in many types of formats,  documents have names such as:  Lliving will  Durable power of attorney for healthcare (healthcare proxy or healthcare power of attorney)  Combined directives  Physician orders for life-sustaining treatment    More information at:  [email or web address]

## 2021-09-06 LAB — URINE CULTURE
MICRO NUMBER:: 13708270
SPECIMEN QUALITY:: ADEQUATE

## 2021-09-06 NOTE — Assessment & Plan Note (Signed)
HTN: BP today satisfactory, checks occasionally BPs at home.  Continue Entresto, metoprolol, check a CMP. High cholesterol: She is currently tolerating Crestor, check labs, refill with results. Fatigue: Chronic issue, at baseline. CHF, nonischemic cardiomyopathy.  No evidence of volume overload today. GI: No symptoms until the last few days when she developed diarrhea x1, she is a slightly tender on exam.  No fever or chills.  No blood in the stools.  Check a CBC. Urinary incontinence: As described above with no increase in low back pain which is chronic.  We will check a UA, urine culture and refer to gynecology. Continue using Depends. Vitamin deficiencies: Rec to continue supplements Preventive care reviewed  , see separate documentation. RTC 6 months

## 2021-09-06 NOTE — Assessment & Plan Note (Signed)
-  Td  2012: Rx printed. - PNM 23: 2012, 2022. - PNM 13: 2015; - s/p zostavax, shingrix rx printed  -Has consistently declined COVID vaccines. Flu shot yearly recommended. CCS: Colonoscopy 03-2006,  Colonoscopy 10-11 , essentially negative  Colonoscopy 11- 2016, next per GI (seen by GI 08-2020) Female care  No recent PAPs we are referring to gyn d/t urinary incontinence.  MMG 01/2021 (K PN) ACP information provided

## 2021-09-07 MED ORDER — SULFAMETHOXAZOLE-TRIMETHOPRIM 800-160 MG PO TABS: 1.0000 | ORAL_TABLET | Freq: Two times a day (BID) | ORAL | 0 refills | Status: DC

## 2021-09-07 NOTE — Addendum Note (Signed)
Addended byDamita Dunnings D on: 09/07/2021 04:59 PM   Modules accepted: Orders

## 2021-09-15 ENCOUNTER — Other Ambulatory Visit: Payer: Self-pay | Admitting: Gastroenterology

## 2021-09-16 ENCOUNTER — Encounter: Payer: Self-pay | Admitting: Gastroenterology

## 2021-09-23 ENCOUNTER — Telehealth: Payer: Self-pay | Admitting: Pharmacist

## 2021-09-23 ENCOUNTER — Ambulatory Visit (INDEPENDENT_AMBULATORY_CARE_PROVIDER_SITE_OTHER): Payer: Medicare Other | Admitting: Pharmacist

## 2021-09-23 DIAGNOSIS — K2100 Gastro-esophageal reflux disease with esophagitis, without bleeding: Secondary | ICD-10-CM

## 2021-09-23 DIAGNOSIS — Z748 Other problems related to care provider dependency: Secondary | ICD-10-CM

## 2021-09-23 DIAGNOSIS — I10 Essential (primary) hypertension: Secondary | ICD-10-CM

## 2021-09-23 DIAGNOSIS — E782 Mixed hyperlipidemia: Secondary | ICD-10-CM

## 2021-09-23 DIAGNOSIS — I5042 Chronic combined systolic (congestive) and diastolic (congestive) heart failure: Secondary | ICD-10-CM

## 2021-09-23 MED ORDER — ONDANSETRON HCL 4 MG PO TABS
ORAL_TABLET | ORAL | 0 refills | Status: DC
Start: 2021-09-23 — End: 2021-11-19

## 2021-09-23 NOTE — Telephone Encounter (Signed)
prescription sent. I agree with plan to see GI

## 2021-09-23 NOTE — Chronic Care Management (AMB) (Signed)
Chronic Care Management Pharmacy Note  09/28/2021 Name:  [name] MRN:  [id number] DOB:  15-Apr-1945  Summary: Reviewed medication with patient and reviewed refill history / assessed adherence. At out last visit I made a medication chart with reason to take and timing of doses for patient but she states she does not recall receiving in the mail. Updated chart, reprinted and mailed to patient.  Patient is due to have the following medications refills - [name] (gets thru mail order pharmacy), metoprolol and omeprazole [facility]).  Patient requests refill on ondansetron (uses about 2 or 3 times per month) however she is due to make appointment with Dr Fuller Plan, gastroenterologist. Requested refill for ondansetron for #10 from PCP. Patient given phone number to make appointment with Dr Fuller Plan. We also discussed transportation to appointments. Patient does not drive and is dependent on her son for transportation and his schedule is limited. Will refer to Care Coordination to explore possible transportation resources.  Patient reports she is taking B12 102mg twice a day. Last serum B12 was 1130 on 12/02/2020. Recommended she only taking B12 supplement 10080m ONCE a day.  Ms. [name] she take Pepto Bismol occasionally. She "swigs" from the bottle. Recommended she use proper measuring device that comes with medication to measure correct does.    Subjective: Alexa PAZOSs an 76o. year old female who is a primary patient of Paz, Alexa BertholdMD.  The CCM team was consulted for assistance with disease management and care coordination needs.    Engaged with patient by telephone for follow up visit in response to provider referral for pharmacy case management and/or care coordination services.   Consent to Services:  The patient was given information about Chronic Care Management services, agreed to services, and gave verbal consent prior to initiation of services.  Please see initial visit note for  detailed documentation.   Patient Care Team: PaColon BranchMD as PCP - General SkJerline PainMD as PCP - Cardiology (Cardiology) MaBjorn LoserMD as Consulting Physician (Urology) PaMaia BreslowMD as Consulting Physician (Orthopedic Surgery) KaAlden HippMD as Consulting Physician (Obstetrics and Gynecology) DuPhylliss BobMD as Consulting Physician (Orthopedic Surgery) AjRed ChristiansMD as Referring Physician (Pain Medicine) StLadene ArtistMD as Consulting Physician (Gastroenterology) EcCherre RobinsRPH-CPP (Pharmacist)  Recent office visits: 09/04/2021 - Int Med (Dr PaLarose KellsF/U chronic conditions. Labs checked. Noted to have UTI. Prescribed Bactrim DS twice a day for 7 days 03/06/2021 - Internal Medicine (Dr PaLarose KellsFollow up visit. Refilled albuterol. F/U 6 months.  Recent consult visits: 07/[place]/2021 [place] [facility] F/U chronic pain. No medication changes noted. F/U 12 weeks 04/09/2021 - [facility] ([place]) chronic pain history significant for lumbar spondylosis, lumbosacral spondylosis and chronic midline low back pain and the patient presents to clinic for re-evaluation. Continued Norco 10/32581m times a day, Lyrica 7m38mtimes a day. Continue physical therapy. F/U 12 weeks.   Hospital visits: None in lat 6 months.    Objective:  Lab Results  Component Value Date   CREATININE 1.01 09/04/2021   CREATININE 0.89 02/20/2021   CREATININE 0.87 12/02/2020    Lab Results  Component Value Date   HGBA1C 5.6 04/15/2020   Last diabetic Eye exam: No results found for: "HMDIABEYEEXA"  Last diabetic Foot exam: No results found for: "HMDIABFOOTEX"      Component Value Date/Time   CHOL 144 09/04/2021 1119   CHOL  233 (H) 02/20/2021 1111   TRIG 132.0 09/04/2021 1119   HDL 55.70 09/04/2021 1119   HDL 49 02/20/2021 1111   CHOLHDL 3 09/04/2021 1119   VLDL 26.4 09/04/2021 1119   LDLCALC 62 09/04/2021 1119   LDLCALC 156 (H)  02/20/2021 1111   LDLDIRECT 166.0 04/14/2010 1539       Latest Ref Rng & Units 09/04/2021   11:19 AM 02/20/2021   11:11 AM 08/28/2020    9:14 AM  Hepatic Function  Total Protein 6.0 - 8.3 g/dL 7.2  6.7  7.2   Albumin 3.5 - 5.2 g/dL 4.3  4.2  4.0   AST 0 - 37 U/L '14  16  14   ' ALT 0 - 35 U/L '10  9  9   ' Alk Phosphatase 39 - 117 U/L 79  86  82   Total Bilirubin 0.2 - 1.2 mg/dL 0.7  0.5  0.6     Lab Results  Component Value Date/Time   TSH 1.89 04/15/2020 02:40 PM   TSH 1.62 12/16/2017 02:16 PM       Latest Ref Rng & Units 09/04/2021   11:19 AM 02/20/2021   11:11 AM 12/02/2020    7:48 AM  CBC  WBC 4.0 - 10.5 K/uL 6.5  6.5  6.3   Hemoglobin 12.0 - 15.0 g/dL 12.8  13.3  13.1   Hematocrit 36.0 - 46.0 % 38.3  41.5  39.4   Platelets 150.0 - 400.0 K/uL 191.0  218  214     Lab Results  Component Value Date/Time   VD25OH 44 12/02/2020 07:48 AM   VD25OH 28.12 (L) 04/15/2020 02:40 PM   VD25OH 36 09/30/2010 03:48 PM    Clinical ASCVD: No  The 10-year ASCVD risk score (Arnett DK, et al., 2019) is: 25.5%   Values used to calculate the score:     Age: 76 years     Sex: Female     Is Non-Hispanic African American: No     Diabetic: No     Tobacco smoker: No     Systolic Blood Pressure: 956 mmHg     Is BP treated: Yes     HDL Cholesterol: 55.7 mg/dL     Total Cholesterol: 144 mg/dL    Other:   DEXA 08/15/2019 AP Spine L1-L3 02/15/2019 74.3 Normal 0.4 1.224 g/cm2 AP Spine L1-L3 10/04/2016 72.0 Normal 0.2 1.190 g/cm2   DualFemur Neck Left 02/15/2019 74.3 Osteopenia -1.8 0.787 g/cm2 DualFemur Neck Left 10/04/2016 72.0 Osteopenia -1.4 0.838 g/cm2   DualFemur Total Mean 02/15/2019 74.3 Osteopenia -1.2 0.860 g/cm2 DualFemur Total Mean 10/04/2016 72.0 Normal -0.8 0.910 g/cm2  FRAX* 10-year Probability of Fracture Based on femoral neck BMD: DualFemur (Left)   Major Osteoporotic Fracture: 10.9% Hip Fracture:                2.3% Population:                  [place] (Caucasian) Risk  Factors:                None  Social History   Tobacco Use  Smoking Status Former   Packs/day: 0.50   Years: 10.00   Total pack years: 5.00   Types: Cigarettes   Quit date: 02/09/1975   Years since quitting: 46.6  Smokeless Tobacco Never  Tobacco Comments   started at age 16.   quit in the 1970s.   BP Readings from Last 3 Encounters:  09/04/21 136/86  03/06/21 132/84  02/20/21 (!) 142/74   Pulse Readings from Last 3 Encounters:  09/04/21 64  03/06/21 (!) 56  02/20/21 66   Wt Readings from Last 3 Encounters:  09/04/21 246 lb 8 oz (111.8 kg)  04/10/21 245 lb (111.1 kg)  03/06/21 248 lb 8 oz (112.7 kg)    Assessment: Review of patient past medical history, allergies, medications, health status, including review of consultants reports, laboratory and other test data, was performed as part of comprehensive evaluation and provision of chronic care management services.   SDOH:  (Social Determinants of Health) assessments and interventions performed:     CCM Care Plan  Allergies  Allergen Reactions   Dextromethorphan-Guaifenesin Nausea And Vomiting   Morphine Nausea And Vomiting   Gabapentin Other (See Comments)    Dizziness, made her feel loopy   Penicillins Rash    "> 30 years ago; best I can remember it was just a light rash on my arm" Did it involve swelling of the face/tongue/throat, SOB, or low BP? No Did it involve sudden or severe rash/hives, skin peeling, or any reaction on the inside of your mouth or nose? Unknown Did you need to seek medical attention at a hospital or doctor's office? No When did it last happen?      more than 30 years  If all above answers are "NO", may proceed with cephalosporin use.     Medications Reviewed Today     Reviewed by [name], RPH-CPP (Pharmacist) on 09/23/21 at 34  Med List Status: <None>   Medication Order Taking? Sig Documenting Provider Last Dose Status Informant  albuterol (VENTOLIN HFA) 108 (90 Base) MCG/ACT  inhaler [id number] Yes Inhale 2 puffs into the lungs every 6 (six) hours as needed for wheezing or shortness of breath. Colon Branch, MD Taking Active   Cholecalciferol (VITAMIN D) 125 MCG (5000 UT) CAPS [id number] Yes Take 5,000 Units by mouth daily. [provider] Taking Active Multiple Informants  Cyanocobalamin (VITAMIN B12) 1000 MCG TBCR [id number] Yes Take 1 tablet by mouth 2 (two) times daily. [provider] Taking Active   dicyclomine (BENTYL) 20 MG tablet [id number] No Take 1 tablet (20 mg total) by mouth 4 (four) times daily -  before meals and at bedtime.  Patient not taking: Reported on 09/23/2021   Ladene Artist, MD Not Taking Active            Med Note Tillie Fantasia Feb 20, 2021  9:39 AM)    [name] 97-103 MG [id number] Yes TAKE 1 TABLET TWICE A DAY Jerline Pain, MD Taking Active   HYDROcodone-acetaminophen [facility]) 10-325 MG tablet [id number] Yes Take 1 tablet by mouth every 6 (six) hours as needed. [provider] Taking Active            Med Note Antony Contras, Takelia Urieta B   Wed Sep 23, 2021 10:45 AM) Takes 1 or 2 times per day  loperamide (IMODIUM) 2 MG capsule [id number] Yes Patient takes 2-4 tablets by mouth as needed for diarrhea or loose stools [provider] Taking Active   metoprolol succinate (TOPROL-XL) 50 MG 24 hr tablet [id number] Yes Take 1 tablet (50 mg total) by mouth daily. Take with or immediately following a meal. Richardson Dopp T, PA-C Taking Active   NARCAN 4 MG/0.1ML LIQD nasal spray kit [id number] No Place 1 spray into the nose once as needed (opioid od).  Patient not taking: Reported on 03/06/2021   [provider] Not Taking Active  Med Note [facility], CARLOS A   Thu Jan 17, 2020  1:27 PM)    nitroGLYCERIN (NITROSTAT) 0.4 MG SL tablet [id number] No Place 1 tablet (0.4 mg total) under the tongue every 5 (five) minutes as needed for chest pain.  Patient not taking: Reported on 03/06/2021   Liliane Shi,  [place] Not Taking Expired 05/21/21 2359            Med Note (CANTER, Cheri Rous   Fri Mar 06, 2021  1:01 PM) PRN  omeprazole (PRILOSEC) 40 MG capsule [id number] Yes TAKE 1 CAPSULE BY MOUTH TWICE A DAY Ladene Artist, MD Taking Active   ondansetron [facility]) 4 MG tablet [id number] No Take 1 tablet by mouth 30 minutes ac meals and hs x 3 days then PRN  Patient not taking: Reported on 09/23/2021   Ladene Artist, MD Not Taking Active   pregabalin (LYRICA) 75 MG capsule [id number] Yes Take 75 mg by mouth 3 (three) times daily. [provider] Taking Active            Med Note Antony Contras, Rayden Scheper B   Wed Sep 23, 2021 10:45 AM) Taking 1 or 2 times per day.  promethazine (PHENERGAN) 25 MG tablet [id number] No Take 25 mg by mouth every 6 (six) hours as needed for nausea or vomiting.  Patient not taking: Reported on 09/23/2021   [provider] Not Taking Consider Medication Status and Discontinue   Propylene Glycol 0.6 % SOLN [id number] Yes Apply 2 drops to eye as needed (for dry eyes). [provider] Taking Active   rOPINIRole (REQUIP) 1 MG tablet [id number] Yes TAKE 1 TABLET BY MOUTH TWICE A DAY Paz, Alda Berthold, MD Taking Active   rosuvastatin (CRESTOR) 20 MG tablet [id number] Yes Take 1 tablet (20 mg total) by mouth daily. Richardson Dopp T, PA-C Taking Active   sertraline (ZOLOFT) 50 MG tablet [id number] Yes Take 1.5 tablets (75 mg total) by mouth daily. Colon Branch, MD Taking Active   traZODone (DESYREL) 50 MG tablet [id number] Yes TAKE 1 TO 2 TABLETS BY MOUTH AT BEDTIME Colon Branch, MD Taking Active             Patient Active Problem List   Diagnosis Date Noted   Carotid artery disease ([place]) 02/19/2021   Pulmonary embolism ([place]) 12/07/2019   Gastric polyps    Fibromyalgia, see office visit note from 02/14/2018 02/15/2018   Paresthesia 12/29/2017   Gait abnormality 09/21/2017   Dizziness 09/21/2017   Raynaud phenomenon 07/22/2017   Nonischemic cardiomyopathy ([place]) 05/18/2017    (HFimpEF) heart failure with improved EF 05/05/2017   Headache 05/05/2017   Chest pain 05/04/2017   Insomnia 10/23/2016   Morbid obesity ([place]) 12/16/2015   Myalgia 07/10/2015   Hyperlipidemia 07/10/2015   PCP NOTES >>>>>>>>>>>>>>>>> 10/31/2014   Anxiety and depression 09/10/2013   Vitamin B 12 deficiency 04/09/2013   Chronic cystitis 05/29/2012   Annual physical exam 06/14/2011   PARESTHESIA 04/01/2009   DJD (degenerative joint disease) 10/21/2008   BARRETTS ESOPHAGUS 07/19/2007   RESTLESS LEGS SYNDROME 05/11/2007   Migraine-- on topamax, rx by welness center 07/27/2006   Hypertension 07/27/2006   Osteopenia 07/27/2006   Chronic fatigue 07/27/2006    Immunization History  Administered Date(s) Administered   Fluad Quad(high Dose 65+) 10/31/2018   Influenza Split 02/17/2011   Influenza Whole 01/14/2010   Influenza, High Dose Seasonal PF 01/24/2015, 12/16/2017   Influenza,inj,Quad PF,6+ Mos 10/22/2013   Influenza-Unspecified 03/13/2016, 10/20/2016  Pneumococcal Conjugate-13 09/10/2013   Pneumococcal Polysaccharide-23 04/14/2010, 08/21/2020   Td 02/08/1998, 04/14/2010   Zoster, Live 12/06/2013    Conditions to be addressed/monitored: CHF, HTN, HLD, Depression, and history of PE - related to COVID; osteopenia; GERD with Barrett's esophagus; neuropathy; fibromylagia  Care Plan : General Pharmacy (Adult)  Updates made by [name], RPH-CPP since 09/28/2021 12:00 AM     Problem: Chronic Conditions: Heart failure, hypertension, Irritable bowel syndrome; Restless leg syndrome, depression, insomnia, osteopenia, chronic pain, fibromyalgia, GERD with Barrett's esophagus   Priority: High  Onset Date: 10/21/2020     Goal: Medication and Chronic Care Management with improve patient knowledge about disease states, treatment and goals and improved adhernece   Start Date: 10/21/2020  This Visit's Progress: On track  Recent Progress: On track  Priority: High  Note:   Current  Barriers:  Unable to independently monitor therapeutic efficacy Does not adhere to prescribed medication regimen (improving)  Transportation - son takes patient to appointments and picks up medications  Pharmacist Clinical Goal(s):  Over the next 90 days, patient will achieve adherence to monitoring guidelines and medication adherence to achieve therapeutic efficacy maintain control of hypertension, acid reflux and mood as evidenced by maintaining goals listed below  adhere to prescribed medication regimen as evidenced by refill history  through collaboration with PharmD and provider.   Interventions: 1:1 collaboration with Colon Branch, MD regarding development and update of comprehensive plan of care as evidenced by provider attestation and co-signature Inter-disciplinary care team collaboration (see longitudinal plan of care) Comprehensive medication review performed; medication list updated in electronic medical record  Hypertension / Congestive Heart Failure: Blood pressure has been variable Blood pressure goal <140/90  CHF goals: prevent exacerbation of CHF and minimize symptoms of CHF Managed by cardiologist Dr Marlou Porch  HF with improved EF Noted to have systolic-diastolic heart failure / non ischemic cardiomyopathy EF was 35% initially; Most recent EF was 60-65% on 12/08/2019  BP Readings from Last 3 Encounters:  09/04/21 136/86  03/06/21 132/84  02/20/21 (!) 142/74  current treatment: Entresto 97/103 mg twice a day Metoprolol ER 45m - take 1 tablet daily  Current home blood pressure readings: not currently checking blood pressure at home. Patient report she has bought 2 blood pressure cuffs in past and both broke so she does not want to purchase another Denies hypotensive or hypertensive symptoms Interventions:   Educated on benefits of Entresto and metoprolol for heart health Counseled on monitor weight and heart rate Coordinated with pharmacy to  fill Rx for metoprolol ER  579mdaily   Hyperlipidemia: Controlled; recent CT of lung noted aortic athlesclerolsis on 11/17/2020. LDL goal of <70 Lab Results  Component Value Date   LDLCALC 62 09/04/2021   LDLCALC 156 (H) 02/20/2021   LDLCALC 74 10/30/2015  current treatment:  Rosuvastatin 2060maily (started 02/23/2021)  Medications previously tried: pravastatin, rosuvastatin (both noted to cause myalgias); ezetimibe tried in 2018 - caused joint pain  Per patient she is tolerating rosuvastatin so far. Denies new myalgias Interventions:  Recommended limit intake of saturated fat and cholesterol Continue rosuvastatin   Depression/sleep disturbance: Controlled;  current treatment: Sertraline 75m31mtake 1.5 tablets = 75mg17mly Trazodone 75mg 58mke 1 or 2 tablets at bedtime Past medications tried: escitalopram / Lexapro in 2015; Effexor Interventions:  Continue current therapy  GERD with Barrett's Esophagus / abdominal cramps: Controlled Current therapy:  Omeprazole 40mg t66m a day Dicyclomine 20mg ta39m tablet 4 times a day before  meal and at bedtime.  Patient reports rare reflux symptoms since starting omeprazole. Usually occurs when eating [place] [employer] [place].  Rarely has abdominal cramping.  Interventions:  Recommended continue current therapy.   Health Maintenance:  Discussed getting vaccines - Tetanus booster and Shingrix vaccines Considering Tetanus and Shingrix if covered by insurance. [facility].   Patient Goals/Self-Care Activities Over the next 90 days, patient will:  take medications as prescribed [facility] cost of Tetanus booster and Shingrix vaccines.  Call gastroenterologist office to scheduled appointment with Dr Fuller Plan.   Follow Up Plan: Telephone follow up appointment with care management team member scheduled for:  2 to 3 months            Medication Assistance: None required.  Patient affirms current coverage meets  needs.  Patient[facility]  [id number] [place] [place] [address]Phone: [phone]: [phone] [facility] [place] [facility] [address] [place]Phone: [phone]: [phone]   Follow Up:  Patient agrees to Care Plan and Follow-up.  Plan: Telephone follow up appointment with care management team member scheduled for:  2 to 3 months  TCherre Robins PharmD Clinical Pharmacist [facility]

## 2021-09-23 NOTE — Telephone Encounter (Signed)
Patient has been experiencing nausea this morning. She usually take ondansetron '4mg'$  when needed for nausea (about 3 or 4 times per month) but has not been able to get refilled. She is past due OV with Dr Fuller Plan. Patient has to rely on her son for transportation but states she will call for appointment with Dr Fuller Plan today.  She is asking if PCP will allow for Rx for ondansetron '4mg'$  for 5 to 10 tablets until she sees GI.    ondansetron (ZOFRAN) 4 MG tablet Take 1 tablet by mouth 30 minutes ac meals and hs x 3 days then PRN,  Last refill was 09/11/2020 for #6 tablets at [place].   Will forward to Dr Larose Kells for review and recommendations.

## 2021-09-28 ENCOUNTER — Telehealth: Payer: Self-pay

## 2021-09-28 NOTE — Patient Instructions (Signed)
[name] It was a pleasure speaking with you today.  Below is a summary of your health goals and summary of our recent visit. You can also view your updated Chronic Care Management Care plan through your MyChart account.   Patient Goals/Self-Care Activities take medications as prescribed [facility] Tetanus booster and Shingrix vaccines.  Call gastroenterologist office to scheduled appointment with Dr Fuller Plan. [phone]   As always if you have any questions or concerns especially regarding medications, please feel free to contact me either at the phone number below or with a MyChart message.   Keep up the good work!  [name], PharmD Clinical Pharmacist [place] [phone] (direct line)  [phone] (main office number)   The patient verbalized understanding of instructions, educational materials, and care plan provided today and agreed to receive a mailed copy of patient instructions, educational materials, and care plan.

## 2021-09-28 NOTE — Telephone Encounter (Signed)
   Telephone encounter was:  Unsuccessful.  09/28/2021 Name: [name] MRN: [id number] DOB: Mar 16, 1945  Unsuccessful outbound call made today to assist with:  Transportation Needs   Outreach Attempt:  1st Attempt  A HIPAA compliant voice message was left requesting a return call.  Instructed patient to call back at  earliest convenience. [place], Care Management  [phone] [address], [place], [place] [address] Phone: [phone] Email: [email or web address]

## 2021-10-01 ENCOUNTER — Telehealth: Payer: Self-pay

## 2021-10-01 NOTE — Telephone Encounter (Signed)
   Telephone encounter was:  Unsuccessful.  10/01/2021 Name: [name] MRN: [id number] DOB: 1945/07/18  Unsuccessful outbound call made today to assist with:  Transportation Needs   Outreach Attempt:  2nd Attempt  A HIPAA compliant voice message was left requesting a return call.  Instructed patient to call back at  earliest convenience.  [place], Care Management  [phone] [address], [place], [place] [address] Phone: [phone] Email: [email or web address]

## 2021-10-02 ENCOUNTER — Telehealth: Payer: Self-pay

## 2021-10-02 NOTE — Telephone Encounter (Signed)
   Telephone encounter was:  Unsuccessful.  10/02/2021 Name: [name] MRN: [id number] DOB: Aug 08, 1945  Unsuccessful outbound call made today to assist with:  Transportation Needs   Outreach Attempt:  3rd Attempt.  Referral closed unable to contact patient.  A HIPAA compliant voice message was left requesting a return call.  Instructed patient to call back at  earliest convenience.  [place], Care Management  [phone] [address], [place], [place] [address] Phone: [phone] Email: [email or web address]

## 2021-10-07 ENCOUNTER — Telehealth: Payer: Self-pay

## 2021-10-07 NOTE — Telephone Encounter (Signed)
   Telephone encounter was:  Successful.  10/07/2021 Name: MARITA BURNSED MRN: [id number] DOB: 1945/05/02  Mamie Laurel is a 76 y.o. year old female who is a primary care patient of Larose Kells, Alda Berthold, MD . The community resource team was consulted for assistance with Transportation Needs   Care guide performed the following interventions: Patient provided with information about care guide support team and interviewed to confirm resource needs.Patient called in asking for future transportation resources to be mailed to her  Follow Up Plan:  No further follow up planned at this time. The patient has been provided with needed resources.    [place], Care Management  [phone] [address], [place], [place] [address] Phone: [phone] Email: [email or web address]

## 2021-10-08 DIAGNOSIS — F32A Depression, unspecified: Secondary | ICD-10-CM | POA: Diagnosis not present

## 2021-10-08 DIAGNOSIS — E785 Hyperlipidemia, unspecified: Secondary | ICD-10-CM

## 2021-10-08 DIAGNOSIS — I110 Hypertensive heart disease with heart failure: Secondary | ICD-10-CM | POA: Diagnosis not present

## 2021-10-08 DIAGNOSIS — I5040 Unspecified combined systolic (congestive) and diastolic (congestive) heart failure: Secondary | ICD-10-CM

## 2021-10-13 ENCOUNTER — Telehealth: Payer: Self-pay | Admitting: Internal Medicine

## 2021-10-13 NOTE — Telephone Encounter (Signed)
Patient states her UTI is back and she made a gynecologist appt like she was advised to but they didn't have an opening until January. She would like to know what to do because she cannot wait until then. Please advise.

## 2021-10-13 NOTE — Telephone Encounter (Signed)
Please advise 

## 2021-10-14 NOTE — Telephone Encounter (Signed)
Appt made for 9/08

## 2021-10-14 NOTE — Telephone Encounter (Signed)
Can we do this during a nurse visit?

## 2021-10-14 NOTE — Telephone Encounter (Signed)
Schedule a nurse visit, nurse to ask sxs and obtain UA UCX

## 2021-10-14 NOTE — Telephone Encounter (Signed)
Will you contact Pt and set her up for a nurse visit (confirmed we can do this during nurse visit with Maudie Mercury). TY.

## 2021-10-16 ENCOUNTER — Other Ambulatory Visit (INDEPENDENT_AMBULATORY_CARE_PROVIDER_SITE_OTHER): Payer: Medicare Other

## 2021-10-16 ENCOUNTER — Ambulatory Visit: Payer: Medicare Other

## 2021-10-16 DIAGNOSIS — N3941 Urge incontinence: Secondary | ICD-10-CM

## 2021-10-16 LAB — POCT URINALYSIS DIP (CLINITEK)
Blood, UA: NEGATIVE
Glucose, UA: NEGATIVE mg/dL
Nitrite, UA: POSITIVE — AB
Spec Grav, UA: >=1.03 — AB (ref 1.010–1.025)
Urobilinogen, UA: 0.2 E.U./dL
pH, UA: 6 (ref 5.0–8.0)

## 2021-10-16 NOTE — Progress Notes (Signed)
Pt says she feels like her UTI sxs cleared after she finished the last round of antibiotic. About a week after completion, incontinence started up again and just an overall feeling of not feeling well.   Pt was sent to the lab for urine dip and cx.

## 2021-10-18 ENCOUNTER — Other Ambulatory Visit: Payer: Self-pay | Admitting: Internal Medicine

## 2021-10-19 ENCOUNTER — Telehealth: Payer: Self-pay | Admitting: Internal Medicine

## 2021-10-19 LAB — URINE CULTURE
MICRO NUMBER:: 13891436
SPECIMEN QUALITY:: ADEQUATE

## 2021-10-19 MED ORDER — SULFAMETHOXAZOLE-TRIMETHOPRIM 800-160 MG PO TABS: 1.0000 | ORAL_TABLET | Freq: Two times a day (BID) | ORAL | 0 refills | Status: DC

## 2021-10-19 NOTE — Telephone Encounter (Signed)
Please advise 

## 2021-10-19 NOTE — Telephone Encounter (Signed)
Patient called to advise that she thought she saw a message about a prescription being called in for a UTI but she can't find the message now. Please call to advise if Dr. Larose Kells did call in something for a UTI based on her lab results from last week.

## 2021-10-19 NOTE — Telephone Encounter (Signed)
Urine culture showed E. coli, send Bactrim DS 1 p.o. twice daily number 10 tablets.  No refills. Encourage good hydration, cranberry juice supplements daily. [name]

## 2021-10-19 NOTE — Telephone Encounter (Signed)
Spoke w/ Pt- informed of results and recommendations. Pt verbalized understanding.  

## 2021-10-22 ENCOUNTER — Telehealth: Payer: Self-pay | Admitting: Internal Medicine

## 2021-10-22 NOTE — Telephone Encounter (Signed)
Pt called stating that the Bactrim she is taking for her UTI is not working. Pt stated that she had started taking it on 9.12.23 and has not had any relief from the meds.   Pt also wanted to ask about the e coli reading on the labs. She was wanting to know if that can get real bad?

## 2021-10-22 NOTE — Telephone Encounter (Signed)
Please advise 

## 2021-10-22 NOTE — Telephone Encounter (Signed)
Spoke w/ Pt- informed of PCP recommendations. Pt verbalized understanding.  

## 2021-10-22 NOTE — Telephone Encounter (Signed)
Urine culture showed E. coli sensitive to Bactrim. Recommend to finish Bactrim as prescribed, drink plenty of fluids, take Azo Standard OTC. Office visit in 2 weeks if not better.  Sooner if symptoms severe

## 2021-10-30 DIAGNOSIS — H04123 Dry eye syndrome of bilateral lacrimal glands: Secondary | ICD-10-CM | POA: Diagnosis not present

## 2021-10-30 DIAGNOSIS — H40023 Open angle with borderline findings, high risk, bilateral: Secondary | ICD-10-CM | POA: Diagnosis not present

## 2021-10-30 DIAGNOSIS — Z961 Presence of intraocular lens: Secondary | ICD-10-CM | POA: Diagnosis not present

## 2021-11-08 ENCOUNTER — Other Ambulatory Visit: Payer: Self-pay | Admitting: Gastroenterology

## 2021-11-09 ENCOUNTER — Ambulatory Visit (INDEPENDENT_AMBULATORY_CARE_PROVIDER_SITE_OTHER): Payer: Medicare Other

## 2021-11-09 VITALS — Ht 66.0 in | Wt 246.0 lb

## 2021-11-09 DIAGNOSIS — Z0000 Encounter for general adult medical examination without abnormal findings: Secondary | ICD-10-CM | POA: Diagnosis not present

## 2021-11-09 NOTE — Progress Notes (Addendum)
Subjective:   [name] is a 76 y.o. female who presents for Medicare Annual (Subsequent) preventive examination.  I connected with Katriona today by telephone and verified that I am speaking with the correct person using two identifiers. Location patient: home Location provider: work Persons participating in the virtual visit: patient, Marine scientist.    I discussed the limitations, risks, security and privacy concerns of performing an evaluation and management service by telephone and the availability of in person appointments. I also discussed with the patient that there may be a patient responsible charge related to this service. The patient expressed understanding and verbally consented to this telephonic visit.    Interactive audio and video telecommunications were attempted between this provider and patient, however failed, due to patient having technical difficulties OR patient did not have access to video capability.  We continued and completed visit with audio only.  Some vital signs may be absent or patient reported.   Time Spent with patient on telephone encounter: 20 minutes   Review of Systems     Cardiac Risk Factors include: advanced age (>56mn, >>28women);hypertension;dyslipidemia;obesity (BMI >30kg/m2);sedentary lifestyle     Objective:    Today's Vitals   11/09/21 1256 11/09/21 1257  Weight: 246 lb (111.6 kg)   Height: _0  (1.676 m)   PainSc:  9    Body mass index is 39.71 kg/m.     11/09/2021    1:01 PM 02/08/2021    1:56 PM 10/11/2020    9:09 AM 12/07/2019    6:45 PM 12/07/2019    3:32 PM 10/01/2019    1:09 PM 09/13/2019   11:31 PM  Advanced Directives  Does Patient Have a Medical Advance Directive? Yes No Yes Yes No Yes Yes  Type of AParamed[place]of [place]Living will  [place] will [place]Living will  [place] will [place] will  Does  patient want to make changes to medical advance directive?    No - Patient declined  No - Patient declined No - Patient declined  Copy of [place]in Chart? No - copy requested  No - copy requested No - copy requested  No - copy requested     Current Medications (verified) Outpatient Encounter Medications as of 11/09/2021  Medication Sig   albuterol (VENTOLIN HFA) 108 (90 Base) MCG/ACT inhaler Inhale 2 puffs into the lungs every 6 (six) hours as needed for wheezing or shortness of breath.   Cholecalciferol (VITAMIN D) 125 MCG (5000 UT) CAPS Take 5,000 Units by mouth daily.   Cyanocobalamin (VITAMIN B12) 1000 MCG TBCR Take 1 tablet by mouth 2 (two) times daily.   dicyclomine (BENTYL) 20 MG tablet Take 1 tablet (20 mg total) by mouth 4 (four) times daily -  before meals and at bedtime.   ENTRESTO 97-103 MG TAKE 1 TABLET TWICE A DAY   HYDROcodone-acetaminophen (NORCO) 10-325 MG tablet Take 1 tablet by mouth every 6 (six) hours as needed.   loperamide (IMODIUM) 2 MG capsule Patient takes 2-4 tablets by mouth as needed for diarrhea or loose stools   metoprolol succinate (TOPROL-XL) 50 MG 24 hr tablet Take 1 tablet (50 mg total) by mouth daily. Take with or immediately following a meal.   NARCAN 4 MG/0.1ML LIQD nasal spray kit Place 1 spray into the nose once as needed (opioid od).   omeprazole (PRILOSEC) 40 MG capsule TAKE 1 CAPSULE BY MOUTH TWICE A  DAY   ondansetron (ZOFRAN) 4 MG tablet Take 1 tablet by mouth 30 minutes ac meals and hs x 3 days then PRN   pregabalin (LYRICA) 75 MG capsule Take 75 mg by mouth 3 (three) times daily.   promethazine (PHENERGAN) 25 MG tablet Take 25 mg by mouth every 6 (six) hours as needed for nausea or vomiting.   Propylene Glycol 0.6 % SOLN Apply 2 drops to eye as needed (for dry eyes).   rOPINIRole (REQUIP) 1 MG tablet Take 1 tablet (1 mg total) by mouth 2 (two) times daily.   rosuvastatin (CRESTOR) 20 MG tablet Take 1 tablet (20 mg total) by  mouth daily.   sertraline (ZOLOFT) 50 MG tablet Take 1.5 tablets (75 mg total) by mouth daily.   traZODone (DESYREL) 50 MG tablet TAKE 1 TO 2 TABLETS BY MOUTH AT BEDTIME   nitroGLYCERIN (NITROSTAT) 0.4 MG SL tablet Place 1 tablet (0.4 mg total) under the tongue every 5 (five) minutes as needed for chest pain. (Patient not taking: Reported on 03/06/2021)   [DISCONTINUED] sulfamethoxazole-trimethoprim (BACTRIM DS) 800-160 MG tablet Take 1 tablet by mouth 2 (two) times daily.   No facility-administered encounter medications on file as of 11/09/2021.    Allergies (verified) Dextromethorphan-guaifenesin, Morphine, Gabapentin, and Penicillins   History: Past Medical History:  Diagnosis Date   Allergy    Anemia    Anxiety    Barrett's esophagus    Bilateral carpal tunnel syndrome    Dr. Eddie Dibbles, having injection therapy   C. difficile diarrhea 08/01/2012   severe 2014   Carotid arterial disease ([name])    Cataract    removed both eyes    Chronic combined systolic and diastolic CHF (congestive heart failure) ([place])    Chronic cystitis    Dr. Matilde Sprang   Chronic lower back pain    Chronic pain syndrome    Depression    Dizziness    DJD (degenerative joint disease)    bilateral hands; knees   Family history of anesthesia complication    Mother had severe N/V   Fibromyalgia    GERD (gastroesophageal reflux disease)    H/O cardiac catheterization    (-) cath 12-2002  , cath again 2011 (-)   History of colon polyps    History of nuclear stress test    Myoview 3/23: EF 51, no ischemia; low risk (echocardiogram in 11/22 with normal EF)   HTN (hypertension)    Hyperlipidemia    past hx    IBS (irritable bowel syndrome)    Insomnia    Migraine    Morbid obesity ([place])    Non-ischemic cardiomyopathy ([place])    Osteoporosis    pt unsure of this   RLS (restless legs syndrome)    Vertigo    Vitamin B 12 deficiency 04/09/2013   Past Surgical History:  Procedure Laterality Date   Arm  surgery Left    "don't remember what they did; arm wasn't broken"   BILATERAL KNEE ARTHROSCOPY Bilateral    BIOPSY  08/07/2018   Procedure: BIOPSY;  Surgeon: Ladene Artist, MD;  Location: [facility];  Service: Endoscopy;;   CARDIAC CATHETERIZATION  01/22/10   clean cath   CATARACT EXTRACTION W/ INTRAOCULAR LENS  IMPLANT, BILATERAL Bilateral    CHOLECYSTECTOMY N/A 10/25/2016   Procedure: LAPAROSCOPIC CHOLECYSTECTOMY;  Surgeon: Georganna Skeans, MD;  Location: [facility];  Service: General;  Laterality: N/A;   COLONOSCOPY     COLONOSCOPY W/ POLYPECTOMY     ESOPHAGOGASTRODUODENOSCOPY (EGD)  WITH PROPOFOL N/A 08/07/2018   Procedure: ESOPHAGOGASTRODUODENOSCOPY (EGD) WITH PROPOFOL;  Surgeon: Ladene Artist, MD;  Location: [facility];  Service: Endoscopy;  Laterality: N/A;   FOOT SURGERY Bilateral    toenails removed; callus removed on right; hammertoes right"   Knot     "removed from right neck; not a goiter"   LUMBAR DISC SURGERY     L5 S1 anterior fusion   OOPHORECTOMY     RIGHT/LEFT HEART CATH AND CORONARY ANGIOGRAPHY N/A 05/06/2017   Procedure: RIGHT/LEFT HEART CATH AND CORONARY ANGIOGRAPHY;  Surgeon: Nelva Bush, MD;  Location: [facility];  Service: Cardiovascular;  Laterality: N/A;   SHOULDER ARTHROSCOPY Right    TOTAL KNEE ARTHROPLASTY  10/05/2011   Procedure: TOTAL KNEE ARTHROPLASTY;  Surgeon: Hessie Dibble, MD;  Location: [facility];  Service: Orthopedics;  Laterality: Right;   UPPER GASTROINTESTINAL ENDOSCOPY     VAGINAL HYSTERECTOMY     for endometriosis   Family History  Problem Relation Age of Onset   Lung cancer Mother    Dementia Father    CAD Father        dx in his 4s   Stroke Father    Congestive Heart Failure Father    Colon cancer Maternal Grandfather    Esophageal cancer Brother    Breast cancer Sister    Diabetes Other        Aunt   Colon cancer Maternal Uncle        2   Hepatitis C Brother    Rectal cancer Neg Hx    Stomach cancer Neg Hx     Colon polyps Neg Hx    Social History   Socioeconomic History   Marital status: Divorced    Spouse name: Not on file   Number of children: 2   Years of education: 12   Highest education level: High school graduate  Occupational History   Occupation: Retired, post Ecologist: RETIRED  Tobacco Use   Smoking status: Former    Packs/day: 0.50    Years: 10.00    Total pack years: 5.00    Types: Cigarettes    Quit date: 02/09/1975    Years since quitting: 46.7   Smokeless tobacco: Never   Tobacco comments:    started at age 85.   quit in the 75s.  Vaping Use   Vaping Use: Never used  Substance and Sexual Activity   Alcohol use: Not Currently   Drug use: No    Comment: CBD and hemp OIL    Sexual activity: Not Currently    Partners: Male  Other Topics Concern   Not on file  Social History Narrative   Son lives with her.   Right-handed.   2 soft drinks per day.   Social Determinants of Health   Financial Resource Strain: Low Risk  (11/09/2021)   Overall Financial Resource Strain (CARDIA)    Difficulty of Paying Living Expenses: Not hard at all  Food Insecurity: No Food Insecurity (11/09/2021)   Hunger Vital Sign    Worried About Running Out of Food in the Last Year: Never true    Ran Out of Food in the Last Year: Never true  Transportation Needs: No Transportation Needs (11/09/2021)   PRAPARE - Hydrologist (Medical): No    Lack of Transportation (Non-Medical): No  Physical Activity: Inactive (11/09/2021)   Exercise Vital Sign    Days of Exercise per Week: 0  days    Minutes of Exercise per Session: 0 min  Stress: No Stress Concern Present (11/09/2021)   [place]    Feeling of Stress : Not at all  Social Connections: Socially Isolated (11/09/2021)   Social Connection and Isolation Panel [NHANES]    Frequency of Communication with Friends and Family: More than three  times a week    Frequency of Social Gatherings with Friends and Family: More than three times a week    Attends Religious Services: Never    Marine scientist or Organizations: No    Attends Archivist Meetings: Never    Marital Status: Divorced    Tobacco Counseling Counseling given: Not Answered Tobacco comments: started at age 15.   quit in the 1970s.   Clinical Intake:  Pre-visit preparation completed: Yes  Pain : 0-10 Pain Score: 9  Pain Type: Chronic pain Pain Location: Back Pain Onset: More than a month ago Pain Frequency: Constant     BMI - recorded: 39.71 Nutritional Status: BMI > 30  Obese Nutritional Risks: None Diabetes: No  How often do you need to have someone help you when you read instructions, pamphlets, or other written materials from your doctor or pharmacy?: 1 - Never  Diabetic?No  Interpreter Needed?: No  Information entered by :: Caroleen Hamman LPN   Activities of Daily Living    11/09/2021    1:05 PM  In your present state of health, do you have any difficulty performing the following activities:  Hearing? 0  Vision? 0  Difficulty concentrating or making decisions? 0  Walking or climbing stairs? 1  Dressing or bathing? 0  Doing errands, shopping? 0  Preparing Food and eating ? N  Using the Toilet? N  In the past six months, have you accidently leaked urine? N  Do you have problems with loss of bowel control? N  Managing your Medications? N  Managing your Finances? N  Housekeeping or managing your Housekeeping? Y    Patient Care Team: Colon Branch, MD as PCP - General Jerline Pain, MD as PCP - Cardiology (Cardiology) Bjorn Loser, MD as Consulting Physician (Urology) Maia Breslow, MD as Consulting Physician (Orthopedic Surgery) Alden Hipp, MD as Consulting Physician (Obstetrics and Gynecology) Phylliss Bob, MD as Consulting Physician (Orthopedic Surgery) Red Christians, MD as Referring Physician (Pain  Medicine) Ladene Artist, MD as Consulting Physician (Gastroenterology) [name], [place] (Pharmacist)  Indicate any recent Medical Services you may have received from other than [facility] providers in the past year (date may be approximate).     Assessment:   This is a routine wellness examination for [facility].  Hearing/Vision screen Hearing Screening - Comments:: No issues Vision Screening - Comments:: Last eye exam-10/2021-Dr. Groat  Dietary issues and exercise activities discussed: Current Exercise Habits: The patient does not participate in regular exercise at present, Exercise limited by: orthopedic condition(s)   Goals Addressed             This Visit's Progress    DIET - EAT MORE FRUITS AND VEGETABLES   Not on track      Depression Screen    11/09/2021    1:05 PM 09/04/2021   10:51 AM 03/06/2021    1:34 PM 10/11/2020    9:07 AM 08/21/2020   10:31 AM 04/15/2020    2:38 PM 10/01/2019    1:13 PM  PHQ 2/9 Scores  PHQ - 2 Score 0 3 1  _0 PHQ- 9 Score  _1 Fall Risk    11/09/2021    1:03 PM 11/20/2020    9:37 AM 10/11/2020    8:59 AM 08/21/2020    9:40 AM 04/15/2020    1:55 PM  Fall Risk   Falls in the past year? 0 0 1 0 1  Number falls in past yr: 0 0 1 0 0  Injury with Fall? 0 0 0 0 0  Risk for fall due to :   History of fall(s);Orthopedic patient;Impaired balance/gait;Impaired vision;Medication side effect    Follow up Falls prevention discussed Falls evaluation completed Education provided;Falls prevention discussed Falls evaluation completed Falls evaluation completed    FALL RISK PREVENTION PERTAINING TO THE HOME:  Any stairs in or around the home? No  Home free of loose throw rugs in walkways, pet beds, electrical cords, etc? Yes  Adequate lighting in your home to reduce risk of falls? Yes   ASSISTIVE DEVICES UTILIZED TO PREVENT FALLS:  Life alert? No  Use of a cane, walker or w/c? Yes  Grab bars in the bathroom? Yes  Shower chair or bench  in shower? No  Elevated toilet seat or a handicapped toilet? Yes   TIMED UP AND GO:  Was the test performed? No . Phone visit   Cognitive Function:    09/23/2017    3:41 PM 09/15/2016    1:30 PM  MMSE - Mini Mental State Exam  Orientation to time 5 5  Orientation to Place 5 5  Registration 3 3  Attention/ Calculation 5 3  Recall 3 3  Language- name 2 objects 2 2  Language- repeat 1 1  Language- follow 3 step command 3 3  Language- read & follow direction 1 1  Write a sentence 1 1  Copy design 1 1  Total score 30 28        11/09/2021    1:10 PM 10/11/2020    9:10 AM  6CIT Screen  What Year? 0 points 0 points  What month? 0 points 0 points  What time? 0 points 0 points  Count back from 20 0 points 0 points  Months in reverse 0 points 0 points  Repeat phrase 0 points 4 points  Total Score 0 points 4 points    Immunizations Immunization History  Administered Date(s) Administered   Fluad Quad(high Dose 65+) 10/31/2018   Influenza Split 02/17/2011   Influenza Whole 01/14/2010   Influenza, High Dose Seasonal PF 01/24/2015, 12/16/2017   Influenza,inj,Quad PF,6+ Mos 10/22/2013   Influenza-Unspecified 03/13/2016, 10/20/2016   Pneumococcal Conjugate-13 09/10/2013   Pneumococcal Polysaccharide-23 04/14/2010, 08/21/2020   Td 02/08/1998, 04/14/2010   Zoster, Live 12/06/2013    TDAP status: Due, Education has been provided regarding the importance of this vaccine. Advised may receive this vaccine at local pharmacy or Health Dept. Aware to provide a copy of the vaccination record if obtained from local pharmacy or Health Dept. Verbalized acceptance and understanding.  Flu Vaccine status: Due, Education has been provided regarding the importance of this vaccine. Advised may receive this vaccine at local pharmacy or Health Dept. Aware to provide a copy of the vaccination record if obtained from local pharmacy or Health Dept. Verbalized acceptance and understanding.  Pneumococcal  vaccine status: Up to date  Covid-19 vaccine status: Declined, Education has been provided regarding the importance of this vaccine but patient still declined. Advised may receive this vaccine at local pharmacy  or Health Dept.or vaccine clinic. Aware to provide a copy of the vaccination record if obtained from local pharmacy or Health Dept. Verbalized acceptance and understanding.  Qualifies for Shingles Vaccine? Yes   Zostavax completed Yes   Shingrix Completed?: No.    Education has been provided regarding the importance of this vaccine. Patient has been advised to call insurance company to determine out of pocket expense if they have not yet received this vaccine. Advised may also receive vaccine at local pharmacy or Health Dept. Verbalized acceptance and understanding.  Screening Tests Health Maintenance  Topic Date Due   Zoster Vaccines- Shingrix (1 of 2) Never done   TETANUS/TDAP  04/13/2020   INFLUENZA VACCINE  09/08/2021   MAMMOGRAM  01/12/2022   Pneumonia Vaccine 21+ Years old  Completed   DEXA SCAN  Completed   Hepatitis C Screening  Completed   HPV VACCINES  Aged Out   COLONOSCOPY (Pts 45-71yr Insurance coverage will need to be confirmed)  Discontinued   COVID-19 Vaccine  Discontinued    Health Maintenance  Health Maintenance Due  Topic Date Due   Zoster Vaccines- Shingrix (1 of 2) Never done   TETANUS/TDAP  04/13/2020   INFLUENZA VACCINE  09/08/2021    Colorectal cancer screening: No longer required.   Mammogram status: Completed bilateral 01/12/2021. Repeat every year  Bone Density status: Declined today  Lung Cancer Screening: (Low Dose CT Chest recommended if Age 77-80years, 30 pack-year currently smoking OR have quit w/in 15years.) does not qualify.     Additional Screening:  Hepatitis C Screening: Completed 06/14/2016  Vision Screening: Recommended annual ophthalmology exams for early detection of glaucoma and other disorders of the eye. Is the patient  up to date with their annual eye exam?  Yes  Who is the provider or what is the name of the office in which the patient attends annual eye exams? Dr. GKaty Fitch Dental Screening: Recommended annual dental exams for proper oral hygiene  Community Resource Referral / Chronic Care Management: CRR required this visit?  No   CCM required this visit?  No      Plan:     I have personally reviewed and noted the following in the patient's chart:   Medical and social history Use of alcohol, tobacco or illicit drugs  Current medications and supplements including opioid prescriptions. Patient is currently taking opioid prescriptions. Information provided to patient regarding non-opioid alternatives. Patient advised to discuss non-opioid treatment plan with their provider. Functional ability and status Nutritional status Physical activity Advanced directives List of other physicians Hospitalizations, surgeries, and ER visits in previous 12 months Vitals Screenings to include cognitive, depression, and falls Referrals and appointments  In addition, I have reviewed and discussed with patient certain preventive protocols, quality metrics, and best practice recommendations. A written personalized care plan for preventive services as well as general preventive health recommendations were provided to patient.   Due to this being a telephonic visit, the after visit summary with patients personalized plan was offered to patient via mail or my-chart. Patient would like to access on my-chart.   MMarta Antu LPN   116/02/[id number] Nurse Health Advisor  Nurse Notes: None   I have reviewed and agree with Health Coaches documentation.  JKathlene November MD

## 2021-11-09 NOTE — Patient Instructions (Signed)
[name] , Thank you for taking time to complete your Medicare Wellness Visit. I appreciate your ongoing commitment to your health goals. Please review the following plan we discussed and let me know if I can assist you in the future.   Screening recommendations/referrals: Colonoscopy: No longer required Mammogram: Completed 01/12/2021-Due 01/12/2022 Bone Density: Due-Declined today Recommended yearly ophthalmology/optometry visit for glaucoma screening and checkup Recommended yearly dental visit for hygiene and checkup  Vaccinations: Influenza vaccine: Due-May obtain vaccine at our office or your local pharmacy. Pneumococcal vaccine: Up to date Tdap vaccine: Due-May obtain vaccine at your local pharmacy. Shingles vaccine: Due-May obtain vaccine at your local pharmacy.   Covid-19:Declined  Advanced directives: Please bring a copy of Living Will and/or Healthcare Power of Attorney for your chart.   Conditions/risks identified: see problem list  Next appointment: Follow up in one year for your annual wellness visit    Preventive Care 65 Years and Older, Female Preventive care refers to lifestyle choices and visits with your health care provider that can promote health and wellness. What does preventive care include? A yearly physical exam. This is also called an annual well check. Dental exams once or twice a year. Routine eye exams. Ask your health care provider how often you should have your eyes checked. Personal lifestyle choices, including: Daily care of your teeth and gums. Regular physical activity. Eating a healthy diet. Avoiding tobacco and drug use. Limiting alcohol use. Practicing safe sex. Taking low-dose aspirin every day. Taking vitamin and mineral supplements as recommended by your health care provider. What happens during an annual well check? The services and screenings done by your health care provider during your annual well check will depend on your age,  overall health, lifestyle risk factors, and family history of disease. Counseling  Your health care provider may ask you questions about your: Alcohol use. Tobacco use. Drug use. Emotional well-being. Home and relationship well-being. Sexual activity. Eating habits. History of falls. Memory and ability to understand (cognition). Work and work Statistician. Reproductive health. Screening  You may have the following tests or measurements: Height, weight, and BMI. Blood pressure. Lipid and cholesterol levels. These may be checked every 5 years, or more frequently if you are over 77 years old. Skin check. Lung cancer screening. You may have this screening every year starting at age 77 if you have a 30-pack-year history of smoking and currently smoke or have quit within the past 15 years. Fecal occult blood test (FOBT) of the stool. You may have this test every year starting at age 77. Flexible sigmoidoscopy or colonoscopy. You may have a sigmoidoscopy every 5 years or a colonoscopy every 10 years starting at age 77. Hepatitis C blood test. Hepatitis B blood test. Sexually transmitted disease (STD) testing. Diabetes screening. This is done by checking your blood sugar (glucose) after you have not eaten for a while (fasting). You may have this done every 1-3 years. Bone density scan. This is done to screen for osteoporosis. You may have this done starting at age 77. Mammogram. This may be done every 1-2 years. Talk to your health care provider about how often you should have regular mammograms. Talk with your health care provider about your test results, treatment options, and if necessary, the need for more tests. Vaccines  Your health care provider may recommend certain vaccines, such as: Influenza vaccine. This is recommended every year. Tetanus, diphtheria, and acellular pertussis (Tdap, Td) vaccine. You may need a Td booster every 10 years.  Zoster vaccine. You may need this after age  77. Pneumococcal 13-valent conjugate (PCV13) vaccine. One dose is recommended after age 77. Pneumococcal polysaccharide (PPSV23) vaccine. One dose is recommended after age 77. Talk to your health care provider about which screenings and vaccines you need and how often you need them. This information is not intended to replace advice given to you by your health care provider. Make sure you discuss any questions you have with your health care provider. Document Released: 02/21/2015 Document Revised: 10/15/2015 Document Reviewed: 11/26/2014 Elsevier Interactive Patient Education  2017 [place] Prevention in the Home Falls can cause injuries. They can happen to people of all ages. There are many things you can do to make your home safe and to help prevent falls. What can I do on the outside of my home? Regularly fix the edges of walkways and driveways and fix any cracks. Remove anything that might make you trip as you walk through a door, such as a raised step or threshold. Trim any bushes or trees on the path to your home. Use bright outdoor lighting. Clear any walking paths of anything that might make someone trip, such as rocks or tools. Regularly check to see if handrails are loose or broken. Make sure that both sides of any steps have handrails. Any raised decks and porches should have guardrails on the edges. Have any leaves, snow, or ice cleared regularly. Use sand or salt on walking paths during winter. Clean up any spills in your garage right away. This includes oil or grease spills. What can I do in the bathroom? Use night lights. Install grab bars by the toilet and in the tub and shower. Do not use towel bars as grab bars. Use non-skid mats or decals in the tub or shower. If you need to sit down in the shower, use a plastic, non-slip stool. Keep the floor dry. Clean up any water that spills on the floor as soon as it happens. Remove soap buildup in the tub or shower  regularly. Attach bath mats securely with double-sided non-slip rug tape. Do not have throw rugs and other things on the floor that can make you trip. What can I do in the bedroom? Use night lights. Make sure that you have a light by your bed that is easy to reach. Do not use any sheets or blankets that are too big for your bed. They should not hang down onto the floor. Have a firm chair that has side arms. You can use this for support while you get dressed. Do not have throw rugs and other things on the floor that can make you trip. What can I do in the kitchen? Clean up any spills right away. Avoid walking on wet floors. Keep items that you use a lot in easy-to-reach places. If you need to reach something above you, use a strong step stool that has a grab bar. Keep electrical cords out of the way. Do not use floor polish or wax that makes floors slippery. If you must use wax, use non-skid floor wax. Do not have throw rugs and other things on the floor that can make you trip. What can I do with my stairs? Do not leave any items on the stairs. Make sure that there are handrails on both sides of the stairs and use them. Fix handrails that are broken or loose. Make sure that handrails are as long as the stairways. Check any carpeting to make sure that  it is firmly attached to the stairs. Fix any carpet that is loose or worn. Avoid having throw rugs at the top or bottom of the stairs. If you do have throw rugs, attach them to the floor with carpet tape. Make sure that you have a light switch at the top of the stairs and the bottom of the stairs. If you do not have them, ask someone to add them for you. What else can I do to help prevent falls? Wear shoes that: Do not have high heels. Have rubber bottoms. Are comfortable and fit you well. Are closed at the toe. Do not wear sandals. If you use a stepladder: Make sure that it is fully opened. Do not climb a closed stepladder. Make sure that  both sides of the stepladder are locked into place. Ask someone to hold it for you, if possible. Clearly mark and make sure that you can see: Any grab bars or handrails. First and last steps. Where the edge of each step is. Use tools that help you move around (mobility aids) if they are needed. These include: Canes. Walkers. Scooters. Crutches. Turn on the lights when you go into a dark area. Replace any light bulbs as soon as they burn out. Set up your furniture so you have a clear path. Avoid moving your furniture around. If any of your floors are uneven, fix them. If there are any pets around you, be aware of where they are. Review your medicines with your doctor. Some medicines can make you feel dizzy. This can increase your chance of falling. Ask your doctor what other things that you can do to help prevent falls. This information is not intended to replace advice given to you by your health care provider. Make sure you discuss any questions you have with your health care provider. Document Released: 11/21/2008 Document Revised: 07/03/2015 Document Reviewed: 03/01/2014 Elsevier Interactive Patient Education  2017 [employer].

## 2021-11-13 ENCOUNTER — Telehealth: Payer: Self-pay | Admitting: Gastroenterology

## 2021-11-13 MED ORDER — OMEPRAZOLE 40 MG PO CPDR: 40.0000 mg | DELAYED_RELEASE_CAPSULE | Freq: Two times a day (BID) | ORAL | 0 refills | Status: DC

## 2021-11-13 NOTE — Telephone Encounter (Signed)
Inbound call from patient requesting refill on her omeprazole, she has an appt with Dr. Fuller Plan 12/6. Please advise.

## 2021-11-13 NOTE — Telephone Encounter (Signed)
Prescription sent to patient's pharmacy until scheduled appt with Dr. Fuller Plan.

## 2021-11-18 ENCOUNTER — Telehealth: Payer: Self-pay | Admitting: Pharmacist

## 2021-11-18 ENCOUNTER — Ambulatory Visit: Payer: Medicare Other | Admitting: Pharmacist

## 2021-11-18 DIAGNOSIS — Z91148 Patient's other noncompliance with medication regimen for other reason: Secondary | ICD-10-CM

## 2021-11-18 DIAGNOSIS — K2100 Gastro-esophageal reflux disease with esophagitis, without bleeding: Secondary | ICD-10-CM

## 2021-11-18 NOTE — Patient Instructions (Signed)
Remember to get your annual flu vaccine.  Consider getting updated Tetaus vaccine and Shingles vaccine as well.    Healthy Living: Sleep In this video, you will learn why sleep is an important part of a healthy lifestyle. To view the content, go to this web address: https://pe.elsevier.com/[email or web address]  This video will expire on: 08/01/2023. If you need access to this video following this date, please reach out to the healthcare provider who assigned it to you. This information is not intended to replace advice given to you by your health care provider. Make sure you discuss any questions you have with your health care provider. Elsevier Patient Education  [place].

## 2021-11-18 NOTE — Progress Notes (Signed)
Pharmacy Note  11/18/2021 Name: [name] MRN: [id number] DOB: 01/16/1946  Subjective: [name] is a 76 y.o. year old female who is a primary care patient of Colon Branch, MD. Clinical Pharmacist Practitioner referral was placed to assist with medication management.    Engaged with patient by telephone for follow up visit today.  Medication Management: Reviewed medication with patient and reviewed refill history / assessed adherence. At out last visit I made a medication chart with reason to take and timing of doses for patient but she states she does not recall receiving in the mail.  She also mentions that her pharmacy is not able to get the strength of hydrocodone / acetaminophen 10/31m that she takes. She is worried about running out. Takes 1 or 2 tablets daily (last filled #120 08/01/2021) Nausea / GI issues: Using ondansetron about 2 or 3 times per week. Patient requests refill on ondansetron. She has appointment to see GI 01/13/2022.   Difficulty Sleeping: Patient reports she has not sleep well the last 2 nights only about 2 hours per night. Took a 1 hour nap yesterday. She takes trazodone 51m- 2 tablets at bedtime currently.    SDOH (Social Determinants of Health) assessments and interventions performed:  SDOH Interventions    Flowsheet Row Chronic Care [facility] from 10/11[facility]2020 [facility] from 07[facility]04/2019 [facility] [place] [facility]        Food Insecurity Interventions -- -- Intervention Not Indicated -- -- --  Housing  Interventions -- -- Intervention Not Indicated -- -- --  Transportation Interventions -- [employer][sent request for care coordination to reach out to patient regarding] Intervention Not Indicated -- -- --  Depression Interventions/Treatment  -- -- Currently on Treatment Currently on Treatment, Counseling Counseling, Medication Currently on Treatment  Financial Strain Interventions Intervention Not Indicated -- Intervention Not Indicated -- -- --  Physical Activity Interventions -- -- Intervention Not Indicated -- -- --  Stress Interventions -- -- Intervention Not Indicated -- -- --  Social Connections Interventions -- -- Intervention Not Indicated -- -- --        Objective: Review of patient status, including review of consultants reports, laboratory and other test data, was performed as part of comprehensive evaluation and provision of chronic care management services.   Lab Results  Component Value Date   CREATININE 1.01 09/04/2021   CREATININE 0.89 02/20/2021   CREATININE 0.87 12/02/2020    Lab Results  Component Value Date   HGBA1C 5.6 04/15/2020       Component Value Date/Time   CHOL 144 09/04/2021 1119   CHOL 233 (H) 02/20/2021 1111   TRIG 132.0 09/04/2021 1119   HDL 55.70 09/04/2021 1119   HDL 49 02/20/2021 1111   CHOLHDL 3 09/04/2021 1119   VLDL 26.4 09/04/2021 1119   LDLCALC 62 09/04/2021 1119   LDLCALC 156 (H) 02/20/2021 1111   LDLDIRECT 166.0 04/14/2010 1539     Clinical ASCVD: No  The 10-year ASCVD risk score (Arnett DK, et al., 2019) is: 28.8%   Values used to calculate  the score:     Age: 65 years     Sex: Female     Is Non-Hispanic African American: No     Diabetic: No     Tobacco smoker: No     Systolic Blood Pressure: 762 mmHg     Is BP treated: Yes     HDL Cholesterol: 55.7 mg/dL     Total Cholesterol: 144 mg/dL    BP Readings from Last 3 Encounters:  09/04/21 136/86  03/06/21 132/84  02/20/21 (!) 142/74     Allergies   Allergen Reactions   Dextromethorphan-Guaifenesin Nausea And Vomiting   Morphine Nausea And Vomiting   Gabapentin Other (See Comments)    Dizziness, made her feel loopy   Penicillins Rash    "> 30 years ago; best I can remember it was just a light rash on my arm" Did it involve swelling of the face/tongue/throat, SOB, or low BP? No Did it involve sudden or severe rash/hives, skin peeling, or any reaction on the inside of your mouth or nose? Unknown Did you need to seek medical attention at a hospital or doctor's office? No When did it last happen?      more than 30 years  If all above answers are "NO", may proceed with cephalosporin use.     Medications Reviewed Today     Reviewed by [name], RPH-CPP (Pharmacist) on 11/18/21 at 1049  Med List Status: <None>   Medication Order Taking? Sig Documenting Provider Last Dose Status Informant  albuterol (VENTOLIN HFA) 108 (90 Base) MCG/ACT inhaler [id number] Yes Inhale 2 puffs into the lungs every 6 (six) hours as needed for wheezing or shortness of breath. Colon Branch, MD Taking Active   Cholecalciferol (VITAMIN D) 125 MCG (5000 UT) CAPS [id number] Yes Take 5,000 Units by mouth daily. [provider] Taking Active Multiple Informants  Cyanocobalamin (VITAMIN B12) 1000 MCG TBCR [id number] Yes Take 1 tablet by mouth daily. [provider] Taking Active   dicyclomine (BENTYL) 20 MG tablet [id number] No Take 1 tablet (20 mg total) by mouth 4 (four) times daily -  before meals and at bedtime.  Patient not taking: Reported on 11/18/2021   Ladene Artist, MD Not Taking Active            Med Note Tillie Fantasia Feb 20, 2021  9:39 AM)    [name] 97-103 MG [id number] Yes TAKE 1 TABLET TWICE A DAY Jerline Pain, MD Taking Active   HYDROcodone-acetaminophen [facility]) 10-325 MG tablet [id number] Yes Take 1 tablet by mouth every 6 (six) hours as needed. [provider] Taking Active            Med Note Antony Contras, Tyrion Glaude  B   Wed Sep 23, 2021 10:45 AM) Takes 1 or 2 times per day  loperamide (IMODIUM) 2 MG capsule [id number] Yes Patient takes 2-4 tablets by mouth as needed for diarrhea or loose stools [provider] Taking Active   metoprolol succinate (TOPROL-XL) 50 MG 24 hr tablet [id number] Yes Take 1 tablet (50 mg total) by mouth daily. Take with or immediately following a meal. Richardson Dopp T, PA-C Taking Active   NARCAN 4 MG/0.1ML LIQD nasal spray kit [id number] No Place 1 spray into the nose once as needed (opioid od).  Patient not taking: Reported on 11/18/2021   [provider] Not Taking Active            Med Note (MENDOZA MENDEZ, CARLOS A  Thu Jan 17, 2020  1:27 PM)    nitroGLYCERIN (NITROSTAT) 0.4 MG SL tablet [id number]  Place 1 tablet (0.4 mg total) under the tongue every 5 (five) minutes as needed for chest pain.  Patient not taking: Reported on 03/06/2021   Richardson Dopp T, PA-C  Expired 05/21/21 2359            Med Note (CANTER, Vilma Prader D   Fri Mar 06, 2021  1:01 PM) PRN  omeprazole (PRILOSEC) 40 MG capsule [id number] Yes Take 1 capsule (40 mg total) by mouth 2 (two) times daily. Ladene Artist, MD Taking Active   ondansetron [facility]) 4 MG tablet [id number] Yes Take 1 tablet by mouth 30 minutes ac meals and hs x 3 days then PRN Colon Branch, MD Taking Active   pregabalin (LYRICA) 75 MG capsule [id number] Yes Take 75 mg by mouth 3 (three) times daily. [provider] Taking Active            Med Note Antony Contras, Sherol Sabas B   Wed Sep 23, 2021 10:45 AM) Taking 1 or 2 times per day.  promethazine (PHENERGAN) 25 MG tablet [id number] No Take 25 mg by mouth every 6 (six) hours as needed for nausea or vomiting.  Patient not taking: Reported on 11/18/2021   [provider] Not Taking Active   Propylene Glycol 0.6 % SOLN [id number] Yes Apply 2 drops to eye as needed (for dry eyes). [provider] Taking Active   rOPINIRole (REQUIP) 1 MG tablet [id number] Yes Take 1 tablet (1  mg total) by mouth 2 (two) times daily. Colon Branch, MD Taking Active   rosuvastatin (CRESTOR) 20 MG tablet [id number] Yes Take 1 tablet (20 mg total) by mouth daily. Richardson Dopp T, PA-C Taking Active   sertraline (ZOLOFT) 50 MG tablet [id number] Yes Take 1.5 tablets (75 mg total) by mouth daily. Colon Branch, MD Taking Active   traZODone (DESYREL) 50 MG tablet [id number] Yes TAKE 1 TO 2 TABLETS BY MOUTH AT BEDTIME Colon Branch, MD Taking Active             Patient Active Problem List   Diagnosis Date Noted   Carotid artery disease ([place]) 02/19/2021   Pulmonary embolism ([place]) 12/07/2019   Gastric polyps    Fibromyalgia, see office visit note from 02/14/2018 02/15/2018   Paresthesia 12/29/2017   Gait abnormality 09/21/2017   Dizziness 09/21/2017   Raynaud phenomenon 07/22/2017   Nonischemic cardiomyopathy ([place]) 05/18/2017   (HFimpEF) heart failure with improved EF 05/05/2017   Headache 05/05/2017   Chest pain 05/04/2017   Insomnia 10/23/2016   Morbid obesity ([place]) 12/16/2015   Myalgia 07/10/2015   Hyperlipidemia 07/10/2015   PCP NOTES >>>>>>>>>>>>>>>>> 10/31/2014   Anxiety and depression 09/10/2013   Vitamin B 12 deficiency 04/09/2013   Chronic cystitis 05/29/2012   Annual physical exam 06/14/2011   PARESTHESIA 04/01/2009   DJD (degenerative joint disease) 10/21/2008   BARRETTS ESOPHAGUS 07/19/2007   RESTLESS LEGS SYNDROME 05/11/2007   Migraine-- on topamax, rx by [facility] center 07/27/2006   Hypertension 07/27/2006   Osteopenia 07/27/2006   Chronic fatigue 07/27/2006     Medication Assistance:  None required.  Patient affirms current coverage meets needs.   Assessment:   Difficulty Sleeping - 2 nights of poor sleep Medication Management - worried about not being able to get hydrocodone/ acetaminophen when due and needs RF for ondansetron.   Plan: Updated medication chart, reprinted and mailed to patient.  Sent message to PCP  regarding ondansetron refill Provided tips  of sleep hygiene. Recommended taking pain medication prior to bedtime to help improve pain / sleep. If continues to have difficulty sleeping over the next week, contact PCP for further discussion (could increase trazodone to 172m) Recommended she contact pain management physician regarding hydrocodone acetaminophen - if not able to get 10/3287mstrength, could change to either 7.5/325 or 5/3254m Reminded patient about upcoming appointment with GI - 01/13/2022 and urogynocologist 01/26/2024n- mailed upcoming appointment schedule.   Follow Up:  Telephone follow up appointment with care management team member scheduled for:  March 2023   TamCherre RobinsharmD Clinical Pharmacist [place] [place] [phone]

## 2021-11-18 NOTE — Telephone Encounter (Signed)
Patient is requesting refill for ondansetron  '4mg'$    Last RF - 09/23/2021 for #30 tablets at [place]

## 2021-11-19 MED ORDER — ONDANSETRON HCL 4 MG PO TABS: 4.0000 mg | ORAL_TABLET | Freq: Two times a day (BID) | ORAL | 0 refills | Status: DC | PRN

## 2021-11-19 NOTE — Telephone Encounter (Signed)
Rx sent 

## 2021-12-03 NOTE — Progress Notes (Deleted)
Office Visit    Patient Name: [name] Date of Encounter: 12/03/2021  Primary Care Provider:  Colon Branch, MD Primary Cardiologist:  Candee Furbish, MD Primary Electrophysiologist: None  Chief Complaint    [name] is a 76 y.o. female with PMH of HTN, HLD, PE in the setting of COVID-19, atherosclerosis, Barrett's esophagus NICM,HFimpEF, GERD, anxiety who presents today for follow-up of CHF.  Past Medical History    Past Medical History:  Diagnosis Date   Allergy    Anemia    Anxiety    Barrett's esophagus    Bilateral carpal tunnel syndrome    Dr. Eddie Dibbles, having injection therapy   C. difficile diarrhea 08/01/2012   severe 2014   Carotid arterial disease ([place])    Cataract    removed both eyes    Chronic combined systolic and diastolic CHF (congestive heart failure) ([place])    Chronic cystitis    Dr. Matilde Sprang   Chronic lower back pain    Chronic pain syndrome    Depression    Dizziness    DJD (degenerative joint disease)    bilateral hands; knees   Family history of anesthesia complication    Mother had severe N/V   Fibromyalgia    GERD (gastroesophageal reflux disease)    H/O cardiac catheterization    (-) cath 12-2002  , cath again 2011 (-)   History of colon polyps    History of nuclear stress test    Myoview 3/23: EF 51, no ischemia; low risk (echocardiogram in 11/22 with normal EF)   HTN (hypertension)    Hyperlipidemia    past hx    IBS (irritable bowel syndrome)    Insomnia    Migraine    Morbid obesity ([place])    Non-ischemic cardiomyopathy ([place])    Osteoporosis    pt unsure of this   RLS (restless legs syndrome)    Vertigo    Vitamin B 12 deficiency 04/09/2013   Past Surgical History:  Procedure Laterality Date   Arm surgery Left    "don't remember what they did; arm wasn't broken"   BILATERAL KNEE ARTHROSCOPY Bilateral    BIOPSY  08/07/2018   Procedure: BIOPSY;  Surgeon: Ladene Artist, MD;  Location: [facility];  Service:  Endoscopy;;   CARDIAC CATHETERIZATION  01/22/10   clean cath   CATARACT EXTRACTION W/ INTRAOCULAR LENS  IMPLANT, BILATERAL Bilateral    CHOLECYSTECTOMY N/A 10/25/2016   Procedure: LAPAROSCOPIC CHOLECYSTECTOMY;  Surgeon: Georganna Skeans, MD;  Location: [facility];  Service: General;  Laterality: N/A;   COLONOSCOPY     COLONOSCOPY W/ POLYPECTOMY     ESOPHAGOGASTRODUODENOSCOPY (EGD) WITH PROPOFOL N/A 08/07/2018   Procedure: ESOPHAGOGASTRODUODENOSCOPY (EGD) WITH PROPOFOL;  Surgeon: Ladene Artist, MD;  Location: [facility];  Service: Endoscopy;  Laterality: N/A;   FOOT SURGERY Bilateral    toenails removed; callus removed on right; hammertoes right"   Knot     "removed from right neck; not a goiter"   LUMBAR DISC SURGERY     L5 S1 anterior fusion   OOPHORECTOMY     RIGHT/LEFT HEART CATH AND CORONARY ANGIOGRAPHY N/A 05/06/2017   Procedure: RIGHT/LEFT HEART CATH AND CORONARY ANGIOGRAPHY;  Surgeon: Nelva Bush, MD;  Location: [facility];  Service: Cardiovascular;  Laterality: N/A;   SHOULDER ARTHROSCOPY Right    TOTAL KNEE ARTHROPLASTY  10/05/2011   Procedure: TOTAL KNEE ARTHROPLASTY;  Surgeon: Hessie Dibble, MD;  Location: [facility];  Service:  Orthopedics;  Laterality: Right;   UPPER GASTROINTESTINAL ENDOSCOPY     VAGINAL HYSTERECTOMY     for endometriosis    Allergies  Allergies  Allergen Reactions   Dextromethorphan-Guaifenesin Nausea And Vomiting   Morphine Nausea And Vomiting   Gabapentin Other (See Comments)    Dizziness, made her feel loopy   Penicillins Rash    "> 30 years ago; best I can remember it was just a light rash on my arm" Did it involve swelling of the face/tongue/throat, SOB, or low BP? No Did it involve sudden or severe rash/hives, skin peeling, or any reaction on the inside of your mouth or nose? Unknown Did you need to seek medical attention at a hospital or doctor's office? No When did it last happen?      more than 30 years  If all above answers are  "NO", may proceed with cephalosporin use.     History of Present Illness    [name]  is a 76 year old female with the above mention past medical history who presents today for follow-up of congestive heart failure.  She was initially seen by Dr. Angelena Form in 2011 for complaint of chest pain.  She endorsed pain located in the center of her chest that was sharp at times. CTA of the chest in the hospital showed no PE but there was a hiatal hernia. She had a normal echo and normal stress myoview.  Patient had LHC performed 01/2010 with normal coronaries.  She was seen in the ED 04/2017 with acute on chronic CHF and chest pain.  She was treated with IV Lasix and had subsequent left heart catheter which showed no significant CAD but revealed EF of 30-35%, patient was started on Entresto.  She was diagnosed with provoked pulmonary embolism due to immobility during COVID and was treated with Eliquis.  She was last seen by Richardson Dopp, PA complaining of shortness of breath and chest pain.  Last 2D echo was completed 12/2020 with EF 55-60, no RWMA, GR 1 DD, normal RVSF, RVSP 34.4, trivial MR, mild AI.  She underwent Lexiscan Myoview that showed no ischemia with EF of 45%     Since last being seen in the office patient reports***.  Patient denies chest pain, palpitations, dyspnea, PND, orthopnea, nausea, vomiting, dizziness, syncope, edema, weight gain, or early satiety.     ***Notes:  Home Medications    Current Outpatient Medications  Medication Sig Dispense Refill   albuterol (VENTOLIN HFA) 108 (90 Base) MCG/ACT inhaler Inhale 2 puffs into the lungs every 6 (six) hours as needed for wheezing or shortness of breath. 18 g 5   Cholecalciferol (VITAMIN D) 125 MCG (5000 UT) CAPS Take 5,000 Units by mouth daily.     Cyanocobalamin (VITAMIN B12) 1000 MCG TBCR Take 1 tablet by mouth daily.     dicyclomine (BENTYL) 20 MG tablet Take 1 tablet (20 mg total) by mouth 4 (four) times daily -  before meals  and at bedtime. (Patient not taking: Reported on 11/18/2021) 360 tablet 3   ENTRESTO 97-103 MG TAKE 1 TABLET TWICE A DAY 180 tablet 3   HYDROcodone-acetaminophen (NORCO) 10-325 MG tablet Take 1 tablet by mouth every 6 (six) hours as needed.     loperamide (IMODIUM) 2 MG capsule Patient takes 2-4 tablets by mouth as needed for diarrhea or loose stools     metoprolol succinate (TOPROL-XL) 50 MG 24 hr tablet Take 1 tablet (50 mg total) by mouth daily. Take with  or immediately following a meal. 90 tablet 3   NARCAN 4 MG/0.1ML LIQD nasal spray kit Place 1 spray into the nose once as needed (opioid od). (Patient not taking: Reported on 11/18/2021)     nitroGLYCERIN (NITROSTAT) 0.4 MG SL tablet Place 1 tablet (0.4 mg total) under the tongue every 5 (five) minutes as needed for chest pain. (Patient not taking: Reported on 03/06/2021) 90 tablet 3   omeprazole (PRILOSEC) 40 MG capsule Take 1 capsule (40 mg total) by mouth 2 (two) times daily. 180 capsule 0   ondansetron (ZOFRAN) 4 MG tablet Take 1 tablet (4 mg total) by mouth 2 (two) times daily as needed for nausea or vomiting. 30 tablet 0   pregabalin (LYRICA) 75 MG capsule Take 75 mg by mouth 3 (three) times daily.     promethazine (PHENERGAN) 25 MG tablet Take 25 mg by mouth every 6 (six) hours as needed for nausea or vomiting. (Patient not taking: Reported on 11/18/2021)     Propylene Glycol 0.6 % SOLN Apply 2 drops to eye as needed (for dry eyes).     rOPINIRole (REQUIP) 1 MG tablet Take 1 tablet (1 mg total) by mouth 2 (two) times daily. 180 tablet 1   rosuvastatin (CRESTOR) 20 MG tablet Take 1 tablet (20 mg total) by mouth daily. 90 tablet 3   sertraline (ZOLOFT) 50 MG tablet Take 1.5 tablets (75 mg total) by mouth daily. 135 tablet 1   traZODone (DESYREL) 50 MG tablet TAKE 1 TO 2 TABLETS BY MOUTH AT BEDTIME 180 tablet 1   No current facility-administered medications for this visit.     Review of Systems  Please see the history of present illness.     (+)*** (+)***  All other systems reviewed and are otherwise negative except as noted above.  Physical Exam    Wt Readings from Last 3 Encounters:  11/09/21 246 lb (111.6 kg)  09/04/21 246 lb 8 oz (111.8 kg)  04/10/21 245 lb (111.1 kg)   LP:FXTKW were no vitals filed for this visit.,There is no height or weight on file to calculate BMI.  Constitutional:      Appearance: Healthy appearance. Not in distress.  Neck:     Vascular: JVD normal.  Pulmonary:     Effort: Pulmonary effort is normal.     Breath sounds: No wheezing. No rales. Diminished in the bases Cardiovascular:     Normal rate. Regular rhythm. Normal S1. Normal S2.      Murmurs: There is no murmur.  Edema:    Peripheral edema absent.  Abdominal:     Palpations: Abdomen is soft non tender. There is no hepatomegaly.  Skin:    General: Skin is warm and dry.  Neurological:     General: No focal deficit present.     Mental Status: Alert and oriented to person, place and time.     Cranial Nerves: Cranial nerves are intact.  EKG/LABS/Other Studies Reviewed    ECG personally reviewed by me today - ***  Risk Assessment/Calculations:   {Does this patient have ATRIAL FIBRILLATION?:[phone]}        Lab Results  Component Value Date   WBC 6.5 09/04/2021   HGB 12.8 09/04/2021   HCT 38.3 09/04/2021   MCV 89.8 09/04/2021   PLT 191.0 09/04/2021   Lab Results  Component Value Date   CREATININE 1.01 09/04/2021   BUN 19 09/04/2021   NA 140 09/04/2021   K 4.9 09/04/2021   CL 103 09/04/2021  CO2 29 09/04/2021   Lab Results  Component Value Date   ALT 10 09/04/2021   AST 14 09/04/2021   ALKPHOS 79 09/04/2021   BILITOT 0.7 09/04/2021   Lab Results  Component Value Date   CHOL 144 09/04/2021   HDL 55.70 09/04/2021   LDLCALC 62 09/04/2021   LDLDIRECT 166.0 04/14/2010   TRIG 132.0 09/04/2021   CHOLHDL 3 09/04/2021    Lab Results  Component Value Date   HGBA1C 5.6 04/15/2020    Assessment & Plan     1.  Combined systolic and diastolic CHF: -2D echo was completed 12/2020 with EF 55-60, no RWMA, GR 1 DD, normal RVSF, RVSP 34.4, trivial MR, mild AI. -Continue GDMT with Entresto 97/103 mg, Toprol XL 50 mg daily 2.  Nonobstructive CAD/chest pain: -Patient had Lexiscan Myoview completed 04/2021 that was low risk with no evidence of ischemia -Today patient reports*** -Continue GDMT with Toprol 50 mg daily, Crestor,   3.  Essential hypertension: -Patient's blood pressure today was*** -  4.  Hyperlipidemia: -Patient's last LDL was 62 -Continue Crestor 20 mg daily         Disposition: Follow-up with Candee Furbish, MD or APP in *** months {Are you ordering a CV Procedure (e.g. stress test, cath, DCCV, TEE, etc)?   Press F2        :[id number]}   Medication Adjustments/Labs and Tests Ordered: Current medicines are reviewed at length with the patient today.  Concerns regarding medicines are outlined above.   Signed, Mable Fill, Marissa Nestle, NP 12/03/2021, 6:42 PM [facility]  Note:  This document was prepared using Dragon voice recognition software and may include unintentional dictation errors.

## 2021-12-04 ENCOUNTER — Ambulatory Visit: Payer: Medicare Other | Admitting: Nurse Practitioner

## 2021-12-04 DIAGNOSIS — I5040 Unspecified combined systolic (congestive) and diastolic (congestive) heart failure: Secondary | ICD-10-CM

## 2021-12-06 ENCOUNTER — Other Ambulatory Visit: Payer: Self-pay | Admitting: Internal Medicine

## 2021-12-15 ENCOUNTER — Ambulatory Visit: Payer: Medicare Other | Admitting: Gastroenterology

## 2021-12-24 ENCOUNTER — Encounter: Payer: Self-pay | Admitting: Cardiology

## 2021-12-24 ENCOUNTER — Ambulatory Visit: Payer: Medicare Other | Attending: Cardiology | Admitting: Cardiology

## 2021-12-24 VITALS — BP 126/82 | HR 71 | Ht 66.0 in | Wt 250.8 lb

## 2021-12-24 DIAGNOSIS — I10 Essential (primary) hypertension: Secondary | ICD-10-CM | POA: Insufficient documentation

## 2021-12-24 DIAGNOSIS — I5032 Chronic diastolic (congestive) heart failure: Secondary | ICD-10-CM | POA: Insufficient documentation

## 2021-12-24 NOTE — Patient Instructions (Signed)
Medication Instructions:  The current medical regimen is effective;  continue present plan and medications.  *If you need a refill on your cardiac medications before your next appointment, please call your pharmacy*  Follow-Up: At [facility], you and your health needs are our priority.  As part of our continuing mission to provide you with exceptional heart care, we have created designated Provider Care Teams.  These Care Teams include your primary Cardiologist (physician) and Advanced Practice Providers (APPs -  Physician Assistants and Nurse Practitioners) who all work together to provide you with the care you need, when you need it.  We recommend signing up for the patient portal called "MyChart".  Sign up information is provided on this After Visit Summary.  MyChart is used to connect with patients for Virtual Visits (Telemedicine).  Patients are able to view lab/test results, encounter notes, upcoming appointments, etc.  Non-urgent messages can be sent to your provider as well.   To learn more about what you can do with MyChart, go to [email or web address].    Your next appointment:   1 year(s)  The format for your next appointment:   In Person  Provider:   Mark Skains, MD      Important Information About Sugar       

## 2021-12-24 NOTE — Progress Notes (Signed)
Cardiology Office Note:    Date:  12/24/2021   ID:  [name], DOB 05-06-1945, MRN [id number]  PCP:  Colon Branch, MD  Cardiologist:  Candee Furbish, MD   Referring MD: Colon Branch, MD     History of Present Illness:    [name] is a 76 y.o. female with systolic/diastolic heart failure EF as low as 30 to 35%, nonischemic with cardiac catheterization on 05/06/2017 showing no CAD here for follow-up.  Thankfully on repeat, echocardiogram showed normal ejection fraction.  [name] was started, Lasix was decreased.  Nutritionist referral was made but insurance would not pay for weight loss center.  Occasional edema especially pedal.  During her visit on 08/25/2017 she endorsed dizziness. Her blood pressure was lower. We were stopping her Lasix 20. Entresto had been titrated. She denies any chest pain. She occasionally would experience migraines. She denies any bleeding or syncope. She was still having orthopnea.   During her visit on 09/05/2019 she had a follow up for CHF. She had trouble breathing at home and back issues. She had a cane in the house and a walker for when going out, but she reported she did not utilize them. Thankfully, her echocardiogram from February 2021 demonstrated normal ejection fraction. This was wonderful. She was no longer feeling any dizziness. She was taking Entresto. She was only taking Lasix as needed.  On 01/17/2020 she had a video visit. She reported that in October she had dyspnea. She had developed Covid and still had some fatigue associated with it. She was treated for PE associated with Covid.  CT scan was reviewed.  Today, she is accompanied by a family member. She reports having chest pain beginning about a month ago. She also endorses having shortness of breath associated with the chest pain. The chest pain occurs mostly when she is walking but sometimes when she is sitting. She notes that she is sometimes stressed when it occurs (such as watching the  [name]). The episodes usually last about a couple of minutes then it resolves. She describes the pain as hurting very badly causing her to hold her chest and sometimes hold her breath a bit.She has GERD and reports she takes medication for it.   She denies any bleeding.   She denies any palpitations or peripheral edema. No lightheadedness, headaches, syncope, orthopnea, or PND.  Past Medical History:  Diagnosis Date   Allergy    Anemia    Anxiety    Barrett's esophagus    Bilateral carpal tunnel syndrome    Dr. Eddie Dibbles, having injection therapy   C. difficile diarrhea 08/01/2012   severe 2014   Carotid arterial disease ([place])    Cataract    removed both eyes    Chronic combined systolic and diastolic CHF (congestive heart failure) ([place])    Chronic cystitis    Dr. Matilde Sprang   Chronic lower back pain    Chronic pain syndrome    Depression    Dizziness    DJD (degenerative joint disease)    bilateral hands; knees   Family history of anesthesia complication    Mother had severe N/V   Fibromyalgia    GERD (gastroesophageal reflux disease)    H/O cardiac catheterization    (-) cath 12-2002  , cath again 2011 (-)   History of colon polyps    History of nuclear stress test    Myoview 3/23: EF 51, no ischemia; low risk (echocardiogram in 11/22 with normal  EF)   HTN (hypertension)    Hyperlipidemia    past hx    IBS (irritable bowel syndrome)    Insomnia    Migraine    Morbid obesity ([place])    Non-ischemic cardiomyopathy ([place])    Osteoporosis    pt unsure of this   RLS (restless legs syndrome)    Vertigo    Vitamin B 12 deficiency 04/09/2013    Past Surgical History:  Procedure Laterality Date   Arm surgery Left    "don't remember what they did; arm wasn't broken"   BILATERAL KNEE ARTHROSCOPY Bilateral    BIOPSY  08/07/2018   Procedure: BIOPSY;  Surgeon: Ladene Artist, MD;  Location: [facility];  Service: Endoscopy;;   CARDIAC CATHETERIZATION  01/22/10    clean cath   CATARACT EXTRACTION W/ INTRAOCULAR LENS  IMPLANT, BILATERAL Bilateral    CHOLECYSTECTOMY N/A 10/25/2016   Procedure: LAPAROSCOPIC CHOLECYSTECTOMY;  Surgeon: Georganna Skeans, MD;  Location: [facility];  Service: General;  Laterality: N/A;   COLONOSCOPY     COLONOSCOPY W/ POLYPECTOMY     ESOPHAGOGASTRODUODENOSCOPY (EGD) WITH PROPOFOL N/A 08/07/2018   Procedure: ESOPHAGOGASTRODUODENOSCOPY (EGD) WITH PROPOFOL;  Surgeon: Ladene Artist, MD;  Location: [facility];  Service: Endoscopy;  Laterality: N/A;   FOOT SURGERY Bilateral    toenails removed; callus removed on right; hammertoes right"   Knot     "removed from right neck; not a goiter"   LUMBAR DISC SURGERY     L5 S1 anterior fusion   OOPHORECTOMY     RIGHT/LEFT HEART CATH AND CORONARY ANGIOGRAPHY N/A 05/06/2017   Procedure: RIGHT/LEFT HEART CATH AND CORONARY ANGIOGRAPHY;  Surgeon: Nelva Bush, MD;  Location: [facility];  Service: Cardiovascular;  Laterality: N/A;   SHOULDER ARTHROSCOPY Right    TOTAL KNEE ARTHROPLASTY  10/05/2011   Procedure: TOTAL KNEE ARTHROPLASTY;  Surgeon: Hessie Dibble, MD;  Location: [facility];  Service: Orthopedics;  Laterality: Right;   UPPER GASTROINTESTINAL ENDOSCOPY     VAGINAL HYSTERECTOMY     for endometriosis    Current Medications: Current Meds  Medication Sig   albuterol (VENTOLIN HFA) 108 (90 Base) MCG/ACT inhaler Inhale 2 puffs into the lungs every 6 (six) hours as needed for wheezing or shortness of breath.   Cholecalciferol (VITAMIN D) 125 MCG (5000 UT) CAPS Take 5,000 Units by mouth daily.   Cyanocobalamin (VITAMIN B12) 1000 MCG TBCR Take 1 tablet by mouth daily.   dicyclomine (BENTYL) 20 MG tablet Take 1 tablet (20 mg total) by mouth 4 (four) times daily -  before meals and at bedtime.   ENTRESTO 97-103 MG TAKE 1 TABLET TWICE A DAY   HYDROcodone-acetaminophen (NORCO) 10-325 MG tablet Take 1 tablet by mouth every 6 (six) hours as needed.   loperamide (IMODIUM) 2 MG capsule  Patient takes 2-4 tablets by mouth as needed for diarrhea or loose stools   metoprolol succinate (TOPROL-XL) 50 MG 24 hr tablet Take 1 tablet (50 mg total) by mouth daily. Take with or immediately following a meal.   NARCAN 4 MG/0.1ML LIQD nasal spray kit Place 1 spray into the nose once as needed (opioid od).   nitroGLYCERIN (NITROSTAT) 0.4 MG SL tablet Place 1 tablet (0.4 mg total) under the tongue every 5 (five) minutes as needed for chest pain.   omeprazole (PRILOSEC) 40 MG capsule Take 1 capsule (40 mg total) by mouth 2 (two) times daily.   ondansetron (ZOFRAN) 4 MG tablet Take 1 tablet (4 mg total)  by mouth 2 (two) times daily as needed for nausea or vomiting.   pregabalin (LYRICA) 75 MG capsule Take 75 mg by mouth 3 (three) times daily.   promethazine (PHENERGAN) 25 MG tablet Take 25 mg by mouth every 6 (six) hours as needed for nausea or vomiting.   Propylene Glycol 0.6 % SOLN Apply 2 drops to eye as needed (for dry eyes).   rOPINIRole (REQUIP) 1 MG tablet Take 1 tablet (1 mg total) by mouth 2 (two) times daily.   rosuvastatin (CRESTOR) 20 MG tablet Take 1 tablet (20 mg total) by mouth daily.   sertraline (ZOLOFT) 50 MG tablet Take 1.5 tablets (75 mg total) by mouth daily.   traZODone (DESYREL) 50 MG tablet Take 1-2 tablets (50-100 mg total) by mouth at bedtime.     Allergies:   Dextromethorphan-guaifenesin, Morphine, Gabapentin, and Penicillins   Social History   Socioeconomic History   Marital status: Divorced    Spouse name: Not on file   Number of children: 2   Years of education: 12   Highest education level: High school graduate  Occupational History   Occupation: Retired, post Ecologist: RETIRED  Tobacco Use   Smoking status: Former    Packs/day: 0.50    Years: 10.00    Total pack years: 5.00    Types: Cigarettes    Quit date: 02/09/1975    Years since quitting: 46.9   Smokeless tobacco: Never   Tobacco comments:    started at age 76.   quit in the 79s.   Vaping Use   Vaping Use: Never used  Substance and Sexual Activity   Alcohol use: Not Currently   Drug use: No    Comment: CBD and hemp OIL    Sexual activity: Not Currently    Partners: Male  Other Topics Concern   Not on file  Social History Narrative   Son lives with her.   Right-handed.   2 soft drinks per day.   Social Determinants of Health   Financial Resource Strain: Low Risk  (11/09/2021)   Overall Financial Resource Strain (CARDIA)    Difficulty of Paying Living Expenses: Not hard at all  Food Insecurity: No Food Insecurity (11/09/2021)   Hunger Vital Sign    Worried About Running Out of Food in the Last Year: Never true    Ran Out of Food in the Last Year: Never true  Transportation Needs: No Transportation Needs (11/09/2021)   PRAPARE - Hydrologist (Medical): No    Lack of Transportation (Non-Medical): No  Physical Activity: Inactive (11/09/2021)   Exercise Vital Sign    Days of Exercise per Week: 0 days    Minutes of Exercise per Session: 0 min  Stress: No Stress Concern Present (11/09/2021)   [place]    Feeling of Stress : Not at all  Social Connections: Socially Isolated (11/09/2021)   Social Connection and Isolation Panel [NHANES]    Frequency of Communication with Friends and Family: More than three times a week    Frequency of Social Gatherings with Friends and Family: More than three times a week    Attends Religious Services: Never    Marine scientist or Organizations: No    Attends Archivist Meetings: Never    Marital Status: Divorced     Family History: The patient's family history includes Breast cancer in her sister; CAD in  her father; Colon cancer in her maternal grandfather and maternal uncle; Congestive Heart Failure in her father; Dementia in her father; Diabetes in an other family member; Esophageal cancer in her brother; Hepatitis C  in her brother; Lung cancer in her mother; Stroke in her father. There is no history of Rectal cancer, Stomach cancer, or Colon polyps.  ROS:   Please see the history of present illness.    (+) Chest pain  (+) Shortness of breath (associated with chest pain) All other systems reviewed and are negative.  EKGs/Labs/Other Studies Reviewed:    The following studies were reviewed today: Prior echocardiogram office notes EKG  Echo 12/19/2020: IMPRESSIONS   1. Left ventricular ejection fraction, by estimation, is 55 to 60%. The  left ventricle has normal function. The left ventricle has no regional  wall motion abnormalities. Left ventricular diastolic parameters are  consistent with Grade I diastolic  dysfunction (impaired relaxation). There is abnormal septal motion due to  conduction delay.   2. Normal right ventricular free wall strain at 30.9%. . Right  ventricular systolic function is normal. The right ventricular size is  mildly enlarged. There is normal pulmonary artery systolic pressure. The  estimated right ventricular systolic pressure  is 77.4 mmHg.   3. The mitral valve is normal in structure. Trivial mitral valve  regurgitation. No evidence of mitral stenosis.   4. The aortic valve is tricuspid. Aortic valve regurgitation is mild. No  aortic stenosis is present.   5. The inferior vena cava is normal in size with greater than 50%  respiratory variability, suggesting right atrial pressure of 3 mmHg.    Myo Perf Stress Test 04/10/2021:   Findings are consistent with no prior ischemia. The study is low risk.   No ST deviation was noted.   LV perfusion is normal.   Left ventricular function is abnormal. Global function is mildly reduced. Nuclear stress EF: 51 %. The left ventricular ejection fraction is mildly decreased (45-54%). End diastolic cavity size is normal.   Prior study available for comparison. There are changes compared to prior study. The left ventricular ejection  fraction has decreased.   Low risk stress nuclear study with normal perfusion and mildly reduced global left ventricular systolic function.   ECHO 05/06/17: Compared to   a prior echo in 2011, LVEF is lower at 40-45% with predominant   inferior and inferoseptal hypokinesis.   Cardiac cath 05/06/2017 Conclusions: No angiographically significant coronary artery disease. Moderately to severely reduced left ventricular contraction (LVEF 30-35%), consistent with non-ischemic cardiomyopathy. Upper normal to mildly elevated left heart, right heart, and pulmonary artery pressures. Low normal to mildly decreased cardiac output/index.   Recommendations: Optimize evidence-based heart failure therapy. Primary prevention of coronary artery disease.   ECHO 04/02/19:   1. Compared with the echo 02/2876, systolic function has improved. . Left  ventricular ejection fraction, by estimation, is 55 to 60%. Left  ventricular ejection fraction by PLAX is 65 %. The left ventricle has  normal function. The left ventricle has no  regional wall motion abnormalities. Left ventricular diastolic parameters  are consistent with Grade I diastolic dysfunction (impaired relaxation).   2. Right ventricular systolic function is normal. The right ventricular  size is normal. There is normal pulmonary artery systolic pressure.   3. The mitral valve is normal in structure and function. Trivial mitral  valve regurgitation. No evidence of mitral stenosis.   4. The aortic valve is tricuspid. Aortic valve regurgitation is mild. No  aortic  stenosis is present.   5. The inferior vena cava is normal in size with greater than 50%  respiratory variability, suggesting right atrial pressure of 3 mmHg.    EKG:  EKG is personally reviewed and interpreted.  12/24/2021: Sinus rhythm. Rate 71 bpm. RBBB. 02/20/2021: NSR. RBBB. Rate 66 bpm.  Recent Labs: 02/20/2021: NT-Pro BNP 271 09/04/2021: ALT 10; BUN 19; Creatinine, Ser 1.01;  Hemoglobin 12.8; Platelets 191.0; Potassium 4.9; Sodium 140  Recent Lipid Panel    Component Value Date/Time   CHOL 144 09/04/2021 1119   CHOL 233 (H) 02/20/2021 1111   TRIG 132.0 09/04/2021 1119   HDL 55.70 09/04/2021 1119   HDL 49 02/20/2021 1111   CHOLHDL 3 09/04/2021 1119   VLDL 26.4 09/04/2021 1119   LDLCALC 62 09/04/2021 1119   LDLCALC 156 (H) 02/20/2021 1111   LDLDIRECT 166.0 04/14/2010 1539    Physical Exam:    VS:  BP 126/82   Pulse 71   Ht _0  (1.676 m)   Wt 250 lb 12.8 oz (113.8 kg)   SpO2 95%   BMI 40.48 kg/m     Wt Readings from Last 3 Encounters:  12/24/21 250 lb 12.8 oz (113.8 kg)  11/09/21 246 lb (111.6 kg)  09/04/21 246 lb 8 oz (111.8 kg)    GEN: Well nourished, well developed, in no acute distress, overweight HEENT: normal  Neck: no JVD, carotid bruits, or masses Cardiac:  RRR; no murmurs, rubs, or gallops,no edema  Respiratory:  clear to auscultation bilaterally, normal work of breathing GI: soft, nontender, nondistended, + BS MS: no deformity or atrophy  Skin: warm and dry, no rash Neuro:  Alert and Oriented x 3, Strength and sensation are intact Psych: euthymic mood, full affect   ASSESSMENT:    1. Chronic heart failure with preserved ejection fraction ([place])   2. Essential hypertension     PLAN:    In order of problems listed above:  Epigastric discomfort - Could be related to her previously described GERD.  In 2019 she had no evidence of coronary artery disease on cardiac catheterization.  In March 2023 she had a nuclear stress test which was low risk showing no ischemia.  She also has some mid back pain as well which could be musculoskeletal.  She is seeing Dr. Fuller Plan her GI doctor soon.  She continues to take Prilosec 40 mg twice a day.  Chronic systolic heart failure -EF previously 30% now normal 55 % on echocardiogram.  Excellent.  NYHA class II-like symptoms.  Mild shortness of breath with activity. -Has Lasix now as needed, doing  well with this. [name] continue with this.  Continue with Toprol.  Excellent.  Very reassuring.  Blood work reassuring.   Prior PE  - Eliquis 56-monthtreatment.  Surrounding Covid. Hemoglobin 11.1 creatinine 0.8 hemoglobin A1c 5.7 -Rare chest discomfort.   Follow up: 1 year.  If symptoms become more worrisome let uKoreaknow.   Medication Adjustments/Labs and Tests Ordered: Current medicines are reviewed at length with the patient today.  Concerns regarding medicines are outlined above.  Orders Placed This Encounter  Procedures   EKG 12-Lead   No orders of the defined types were placed in this encounter.  Patient Instructions  Medication Instructions:  The current medical regimen is effective;  continue present plan and medications.  *If you need a refill on your cardiac medications before your next appointment, please call your pharmacy*  Follow-Up: [facility] and your health needs  are our priority.  As part of our continuing mission to provide you with exceptional heart care, we have created designated Provider Care Teams.  These Care Teams include your primary Cardiologist (physician) and Advanced Practice Providers (APPs -  Physician Assistants and Nurse Practitioners) who all work together to provide you with the care you need, when you need it.  We recommend signing up for the patient portal called "MyChart".  Sign up information is provided on this After Visit Summary.  MyChart is used to connect with patients for Virtual Visits (Telemedicine).  Patients are able to view lab/test results, encounter notes, upcoming appointments, etc.  Non-urgent messages can be sent to your provider as well.   To learn more about what you can do with MyChart, go to [email or web address].    Your next appointment:   1 year(s)  The format for your next appointment:   In Person  Provider:   Candee Furbish, MD     Important Information About Sugar          I,Rachel  Rivera,acting as a scribe for Candee Furbish, MD.,have documented all relevant documentation on the behalf of Candee Furbish, MD,as directed by  Candee Furbish, MD while in the presence of Candee Furbish, MD.  I, Candee Furbish, MD, have reviewed all documentation for this visit. The documentation on 12/24/21 for the exam, diagnosis, procedures, and orders are all accurate and complete. *   Signed, Candee Furbish, MD  12/24/2021 9:17 AM    [place]

## 2022-01-13 ENCOUNTER — Encounter: Payer: Self-pay | Admitting: Gastroenterology

## 2022-01-13 ENCOUNTER — Ambulatory Visit (INDEPENDENT_AMBULATORY_CARE_PROVIDER_SITE_OTHER): Payer: Medicare Other | Admitting: Gastroenterology

## 2022-01-13 VITALS — BP 118/68 | HR 79 | Ht 66.0 in | Wt 251.0 lb

## 2022-01-13 DIAGNOSIS — K580 Irritable bowel syndrome with diarrhea: Secondary | ICD-10-CM

## 2022-01-13 DIAGNOSIS — K2270 Barrett's esophagus without dysplasia: Secondary | ICD-10-CM

## 2022-01-13 MED ORDER — OMEPRAZOLE 40 MG PO CPDR: 40.0000 mg | DELAYED_RELEASE_CAPSULE | Freq: Two times a day (BID) | ORAL | 3 refills | Status: AC

## 2022-01-13 MED ORDER — DICYCLOMINE HCL 20 MG PO TABS: 20.0000 mg | ORAL_TABLET | Freq: Three times a day (TID) | ORAL | 3 refills | Status: AC

## 2022-01-13 NOTE — Progress Notes (Signed)
Assessment     GERD, short segment Barrett's esophagus without dysplasia Family history of colon cancer, no longer in surveillance IBS-D 4.   History of biliary pancreatitis 2018 status post cholecystectomy 5.   Fibromyalgia 6.   Congestive heart failure 7.   History of PE 8.   Hypertension 9.   Obesity   Recommendations    Continue omeprazole 40 mg bid and follow antireflux measures Continue dicyclomine 20 mg p.o. 4 times daily taking before meals and at bedtime.  Continue Imodium 1-2 3 times daily as needed Discontinue surveillance EGDs, colonoscopies due to age, comorbidities Trial of Tums or Mylanta as needed for episodic epigastric pain REV in 1 year   HPI    This is a 76 year old female returning for follow-up of GERD, Barrett's esophagus and IBS-D.  She notes that certain foods exacerbate her reflux and IBS however she is not always able to avoid them.  She has frequent episodes of epigastric pain associated with nausea which has been helped with Zofran and Phenergan as needed.  She relates these symptoms occur a few times per week.  We discussed her overall health and the risks / benefits of surveillance EGDs and colonoscopies.   EGD June 2020 - Esophageal mucosal changes secondary to established short-segment Barrett's disease. Biopsied. - A few gastric polyps. Biopsied. - Small hiatal hernia. - Normal duodenal bulb and second portion of the duodenum.  Colonoscopy Nov 2016 1. Two sessile polyps in the sigmoid colon; polypectomies performed with a cold snare 2. 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 The examination was otherwise normal   Labs / Imaging       Latest Ref Rng & Units 09/04/2021   11:19 AM 02/20/2021   11:11 AM 08/28/2020    9:14 AM  Hepatic Function  Total Protein 6.0 - 8.3 g/dL 7.2  6.7  7.2   Albumin 3.5 - 5.2 g/dL 4.3  4.2  4.0   AST 0 - 37 U/L _0 ALT 0 - 35 U/L _1 Alk Phosphatase 39 - 117 U/L 79  86  82   Total  Bilirubin 0.2 - 1.2 mg/dL 0.7  0.5  0.6        Latest Ref Rng & Units 09/04/2021   11:19 AM 02/20/2021   11:11 AM 12/02/2020    7:48 AM  CBC  WBC 4.0 - 10.5 K/uL 6.5  6.5  6.3   Hemoglobin 12.0 - 15.0 g/dL 12.8  13.3  13.1   Hematocrit 36.0 - 46.0 % 38.3  41.5  39.4   Platelets 150.0 - 400.0 K/uL 191.0  218  214     Current Medications, Allergies, Past Medical History, Past Surgical History, Family History and Social History were reviewed in [employer] record.   Physical Exam: General: Well developed, well nourished, elderly, no acute distress Head: Normocephalic and atraumatic Eyes: Sclerae anicteric, EOMI Ears: Normal auditory acuity Mouth: No deformities or lesions noted Lungs: Clear throughout to auscultation Heart: Regular rate and rhythm; No murmurs, rubs or bruits Abdomen: Soft, non tender and non distended. No masses, hepatosplenomegaly or hernias noted. Normal Bowel sounds Rectal: Not done Musculoskeletal: Symmetrical with no gross deformities  Pulses:  Normal pulses noted Extremities: No edema or deformities noted Neurological: Alert oriented x 4, grossly nonfocal, uses a cane Psychological:  Alert and cooperative. Normal mood and affect   Dezyrae Kensinger T. Fuller Plan, MD 01/13/2022,  8:21 AM

## 2022-01-13 NOTE — Patient Instructions (Signed)
We have sent the following medications to your pharmacy for you to pick up at your convenience: omeprazole and dicyclomine.   The [facility] providers would like to encourage you to use [facility] to communicate with providers for non-urgent requests or questions.  Due to long hold times on the telephone, sending your provider a message by [facility] may be a faster and more efficient way to get a response.  Please allow 48 business hours for a response.  Please remember that this is for non-urgent requests.   Thank you for choosing me and [facility].  Pricilla Riffle. Dagoberto Ligas., MD., [facility]

## 2022-01-14 ENCOUNTER — Other Ambulatory Visit: Payer: Self-pay | Admitting: Internal Medicine

## 2022-01-26 ENCOUNTER — Other Ambulatory Visit: Payer: Self-pay | Admitting: Physician Assistant

## 2022-03-05 ENCOUNTER — Ambulatory Visit: Payer: Medicare Other | Admitting: Obstetrics and Gynecology

## 2022-03-12 ENCOUNTER — Encounter: Payer: Self-pay | Admitting: Internal Medicine

## 2022-03-12 ENCOUNTER — Ambulatory Visit (INDEPENDENT_AMBULATORY_CARE_PROVIDER_SITE_OTHER): Payer: Medicare Other | Admitting: Internal Medicine

## 2022-03-12 VITALS — BP 126/80 | HR 111 | Temp 98.3°F | Resp 18 | Ht 66.0 in | Wt 245.2 lb

## 2022-03-12 DIAGNOSIS — G4700 Insomnia, unspecified: Secondary | ICD-10-CM

## 2022-03-12 DIAGNOSIS — F419 Anxiety disorder, unspecified: Secondary | ICD-10-CM | POA: Diagnosis not present

## 2022-03-12 DIAGNOSIS — R000 Tachycardia, unspecified: Secondary | ICD-10-CM | POA: Diagnosis not present

## 2022-03-12 DIAGNOSIS — K589 Irritable bowel syndrome without diarrhea: Secondary | ICD-10-CM

## 2022-03-12 DIAGNOSIS — I509 Heart failure, unspecified: Secondary | ICD-10-CM | POA: Diagnosis not present

## 2022-03-12 DIAGNOSIS — F32A Depression, unspecified: Secondary | ICD-10-CM | POA: Diagnosis not present

## 2022-03-12 LAB — CBC WITH DIFFERENTIAL/PLATELET
Basophils Absolute: 0 10*3/uL (ref 0.0–0.1)
Basophils Relative: 0.4 % (ref 0.0–3.0)
Eosinophils Absolute: 0 10*3/uL (ref 0.0–0.7)
Eosinophils Relative: 0.3 % (ref 0.0–5.0)
HCT: 40 % (ref 36.0–46.0)
Hemoglobin: 13.6 g/dL (ref 12.0–15.0)
Lymphocytes Relative: 26.6 % (ref 12.0–46.0)
Lymphs Abs: 2.2 10*3/uL (ref 0.7–4.0)
MCHC: 33.9 g/dL (ref 30.0–36.0)
MCV: 88.6 fl (ref 78.0–100.0)
Monocytes Absolute: 0.5 10*3/uL (ref 0.1–1.0)
Monocytes Relative: 6 % (ref 3.0–12.0)
Neutro Abs: 5.5 10*3/uL (ref 1.4–7.7)
Neutrophils Relative %: 66.7 % (ref 43.0–77.0)
Platelets: 214 10*3/uL (ref 150.0–400.0)
RBC: 4.52 Mil/uL (ref 3.87–5.11)
RDW: 13.8 % (ref 11.5–15.5)
WBC: 8.2 10*3/uL (ref 4.0–10.5)

## 2022-03-12 LAB — COMPREHENSIVE METABOLIC PANEL
ALT: 11 U/L (ref 0–35)
AST: 14 U/L (ref 0–37)
Albumin: 4.3 g/dL (ref 3.5–5.2)
Alkaline Phosphatase: 74 U/L (ref 39–117)
BUN: 10 mg/dL (ref 6–23)
CO2: 27 mEq/L (ref 19–32)
Calcium: 9 mg/dL (ref 8.4–10.5)
Chloride: 103 mEq/L (ref 96–112)
Creatinine, Ser: 0.93 mg/dL (ref 0.40–1.20)
GFR: 59.31 mL/min — ABNORMAL LOW (ref >60.00)
Glucose, Bld: 110 mg/dL — ABNORMAL HIGH (ref 70–99)
Potassium: 4.2 mEq/L (ref 3.5–5.1)
Sodium: 140 mEq/L (ref 135–145)
Total Bilirubin: 0.8 mg/dL (ref 0.2–1.2)
Total Protein: 7.4 g/dL (ref 6.0–8.3)

## 2022-03-12 LAB — TSH: TSH: 2.41 u[IU]/mL (ref 0.35–5.50)

## 2022-03-12 MED ORDER — TRAZODONE HCL 50 MG PO TABS: 150.0000 mg | ORAL_TABLET | Freq: Every day | ORAL | 1 refills | Status: DC | PRN

## 2022-03-12 NOTE — Patient Instructions (Addendum)
Vaccines I recommend:  Tdap (tetanus) Shingrix (shingles) RSV vaccine  Increase trazodone to 3 tablets at bedtime  Continue Zoloft  If you have severe or different symptoms such as blood in the stools, consistent black tarry stools, severe stomach pain, fever chills, severe chest pain or difficulty breathing: Go to the ER  GO TO THE LAB : Get the blood work     GO TO THE FRONT DESK, [place] back for a checkup in 6 months

## 2022-03-12 NOTE — Progress Notes (Unsigned)
Subjective:    Patient ID: [name], female    DOB: 1945/10/07, 77 y.o.   MRN: [id number]  DOS:  03/12/2022 Type of visit - description: Routine checkup  Patient requested a routine checkup. Also continue with GI symptoms, they happen on-off  but this time they have been happening for the last 4 weeks: Nausea, nonnecessary postprandial, no vomiting. Diarrhea most days, described as loose stools with no blood. Color of the stools is typically normal but occasionally dark.  Also has random upper abdominal pain and lower abdominal pain.  She states that the lower abdominal pain is IBS related but she is not sure about the upper abdominal pain. Has taking an extra PPI now and then with some relief of the above symptoms but the relief is not consistent.  No fever chills Not taking in the ibuprofen. Still has episodic chest pain.   Review of Systems See above   Past Medical History:  Diagnosis Date   Allergy    Anemia    Anxiety    Barrett's esophagus    Bilateral carpal tunnel syndrome    Dr. Eddie Dibbles, having injection therapy   C. difficile diarrhea 08/01/2012   severe 2014   Carotid arterial disease ([place])    Cataract    removed both eyes    Chronic combined systolic and diastolic CHF (congestive heart failure) ([place])    Chronic cystitis    Dr. Matilde Sprang   Chronic lower back pain    Chronic pain syndrome    Depression    Dizziness    DJD (degenerative joint disease)    bilateral hands; knees   Family history of anesthesia complication    Mother had severe N/V   Fibromyalgia    GERD (gastroesophageal reflux disease)    H/O cardiac catheterization    (-) cath 12-2002  , cath again 2011 (-)   History of colon polyps    History of nuclear stress test    Myoview 3/23: EF 51, no ischemia; low risk (echocardiogram in 11/22 with normal EF)   HTN (hypertension)    Hyperlipidemia    past hx    IBS (irritable bowel syndrome)    Insomnia    Migraine    Morbid obesity  ([place])    Non-ischemic cardiomyopathy ([place])    Osteoporosis    pt unsure of this   RLS (restless legs syndrome)    Vertigo    Vitamin B 12 deficiency 04/09/2013    Past Surgical History:  Procedure Laterality Date   Arm surgery Left    "don't remember what they did; arm wasn't broken"   BILATERAL KNEE ARTHROSCOPY Bilateral    BIOPSY  08/07/2018   Procedure: BIOPSY;  Surgeon: Ladene Artist, MD;  Location: [facility];  Service: Endoscopy;;   CARDIAC CATHETERIZATION  01/22/10   clean cath   CATARACT EXTRACTION W/ INTRAOCULAR LENS  IMPLANT, BILATERAL Bilateral    CHOLECYSTECTOMY N/A 10/25/2016   Procedure: LAPAROSCOPIC CHOLECYSTECTOMY;  Surgeon: Georganna Skeans, MD;  Location: [facility];  Service: General;  Laterality: N/A;   COLONOSCOPY     COLONOSCOPY W/ POLYPECTOMY     ESOPHAGOGASTRODUODENOSCOPY (EGD) WITH PROPOFOL N/A 08/07/2018   Procedure: ESOPHAGOGASTRODUODENOSCOPY (EGD) WITH PROPOFOL;  Surgeon: Ladene Artist, MD;  Location: [facility];  Service: Endoscopy;  Laterality: N/A;   FOOT SURGERY Bilateral    toenails removed; callus removed on right; hammertoes right"   Knot     "removed from right neck; not a goiter"  LUMBAR DISC SURGERY     L5 S1 anterior fusion   OOPHORECTOMY     RIGHT/LEFT HEART CATH AND CORONARY ANGIOGRAPHY N/A 05/06/2017   Procedure: RIGHT/LEFT HEART CATH AND CORONARY ANGIOGRAPHY;  Surgeon: Nelva Bush, MD;  Location: [facility];  Service: Cardiovascular;  Laterality: N/A;   SHOULDER ARTHROSCOPY Right    TOTAL KNEE ARTHROPLASTY  10/05/2011   Procedure: TOTAL KNEE ARTHROPLASTY;  Surgeon: Hessie Dibble, MD;  Location: [facility];  Service: Orthopedics;  Laterality: Right;   UPPER GASTROINTESTINAL ENDOSCOPY     VAGINAL HYSTERECTOMY     for endometriosis    Current Outpatient Medications  Medication Instructions   albuterol (VENTOLIN HFA) 108 (90 Base) MCG/ACT inhaler 2 puffs, Inhalation, Every 6 hours PRN   Cyanocobalamin (VITAMIN B12) 1000  MCG TBCR 1 tablet, Oral, Daily   dicyclomine (BENTYL) 20 mg, Oral, 3 times daily before meals & bedtime   ENTRESTO 97-103 MG TAKE 1 TABLET TWICE A DAY   HYDROcodone-acetaminophen (NORCO) 10-325 MG tablet 1 tablet, Every 6 hours PRN   loperamide (IMODIUM) 2 MG capsule Patient takes 2-4 tablets by mouth as needed for diarrhea or loose stools   metoprolol succinate (TOPROL-XL) 50 mg, Oral, Daily, Take with or immediately following a meal.   NARCAN 4 MG/0.1ML LIQD nasal spray kit 1 spray, Once PRN   nitroGLYCERIN (NITROSTAT) 0.4 mg, Sublingual, Every 5 min PRN   omeprazole (PRILOSEC) 40 mg, Oral, 2 times daily   ondansetron (ZOFRAN) 4 mg, Oral, 2 times daily PRN   pregabalin (LYRICA) 75 mg, Oral, 3 times daily   promethazine (PHENERGAN) 25 mg, Every 6 hours PRN   Propylene Glycol 0.6 % SOLN 2 drops, Ophthalmic, As needed   rOPINIRole (REQUIP) 1 mg, Oral, 2 times daily   rosuvastatin (CRESTOR) 20 mg, Oral, Daily   sertraline (ZOLOFT) 75 mg, Oral, Daily   traZODone (DESYREL) 50-100 mg, Oral, Daily at bedtime   Vitamin D 5,000 Units, Oral, Daily       Objective:   Physical Exam BP 126/80   Pulse (!) 111   Temp 98.3 F (36.8 C) (Oral)   Resp 18   Ht '5\' 6"'$  (1.676 m)   Wt 245 lb 4 oz (111.2 kg)   SpO2 97%   BMI 39.58 kg/m  General:   Well developed, NAD, BMI noted.  HEENT:  Normocephalic . Face symmetric, atraumatic Lungs:  CTA B Normal respiratory effort, no intercostal retractions, no accessory muscle use. Heart: Tachycardic but seems regular Abdomen:  Not distended, soft, slightly TTP at different places: Epigastrium, right lower quadrant, no mass or rebound. Skin: Not pale. Not jaundice Lower extremities: no pretibial edema bilaterally  Neurologic:  alert & oriented X3.  Speech normal, gait appropriate for age and unassisted Psych--  Cognition and judgment appear intact.  Cooperative with normal attention span and concentration.  Behavior appropriate. No anxious or  depressed appearing.     Assessment     Assessment   HTN Hyperlipidemia-  Pravachol, : Myalgias. Intolerant to Zetia (joint aches), see OV  06/2016  Depression, Anxiety (on clonazepam), insomnia (on trazodone):  depression x many  years, remotely on zoloft and other meds per psych, I rx lexapro 2015, then was rx effexor x a while Morbid obesity CV: CHF/ non ischemic cardiomyopathy dx 04-2017; cath normal coronaries, EF ~ 30% Carotid artery disease: 1 to 39% bilateral carotids 09-2016, start aspirin per neurology GI:  --GERD, Barrett's esophagus, had a EGD 07/2018  --h/o persistent C. difficile  diarrhea 2014 --Cholecystectomy B12 deficiency Osteopenia: Dexa 2015 showed osteopenia, T score -1.9 (10/2016): tscore 2021 - 1.8; Rx cs, vit D PULM: --NPSG 2008:  No osa, PLMS 4/hr with arousal and awakening --Home sleep study 11-2020 mild sleep apnea, further advised per pulmonary. --RLS - Requip  Migraines, f/u [facility] -- off topamax as off 06-2016 MSK: --Back pain, chronic: pain meds rx by ortho, Workers comp MD @ [employer] pain mngmt [place] --DJD,CTS B ([facility]) --Fibromyalgia.  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  anticoag until 08-2020.  PLAN GERD, IBS-D Saw GI 01/13/2022, continue omeprazole BID, dicyclomine, Imodium.  Discontinue surveillance EGDs or colonoscopies due to age. Current GI symptoms likely IBS related.  Will check a CBC, if anemia noted will need further eval. Encouraged to use all the medications she has for as needed use such as dicyclomine, Zofran, Phenergan.  Also take an additional PPI if needed. Red flag symptoms discussed, see AVS: CHF Saw cardiology 12/24/2021, felt to be stable.   Next visit 1 year Today she does not seem volume overloaded however she is tachycardic.  I liked to rule out A-fib, EKG done today: It confirms sinus  tachycardia, no A-fib.  RBBB: Not new.  Do not see any major changes. She still reports episodic chest pain, She reported same to cardiology at the last visit, they noted previous ischemic workup to be negative. Plan: Continue present care, check CMP TSH Depression anxiety insomnia: PHQ-9: 12 Continue sertraline states trazodone is not helping much.  Recommend to increase dose to 3 tablets nightly as needed (potential interaction with Zoloft noted, Zoloft is a low-dose). Hopefully trazodone will help with depression and insomnia. Back pain, chronic pain: Has not been able to get hydrocodone, not taking it for a while. RTC 6 months  Time spent 35 m

## 2022-03-13 NOTE — Assessment & Plan Note (Signed)
GERD, IBS-D Saw GI 01/13/2022, continue omeprazole BID, dicyclomine, Imodium.  Discontinue surveillance EGDs or colonoscopies due to age. Current GI symptoms likely IBS related.  Will check a CBC, if anemia noted will need further eval. Encouraged to use all the medications she has for as needed use such as dicyclomine, Zofran, Phenergan.  Also take an additional PPI if needed. Red flag symptoms discussed, see AVS: CHF Saw cardiology 12/24/2021, felt to be stable.   Next visit 1 year Today she does not seem volume overloaded however she is tachycardic.  I liked to rule out A-fib, EKG done today: It confirms sinus tachycardia, no A-fib.  RBBB: Not new.  Do not see any major changes. She still reports episodic chest pain, She reported same to cardiology at the last visit, they noted previous ischemic workup to be negative. Plan: Continue present care, check CMP TSH Depression anxiety insomnia: PHQ-9: 12 Continue sertraline states trazodone is not helping much.  Recommend to increase dose to 3 tablets nightly as needed (potential interaction with Zoloft noted, Zoloft is a low-dose). Hopefully trazodone will help with depression and insomnia. Back pain, chronic pain: Has not been able to get hydrocodone, not taking it for a while. RTC 6 months

## 2022-03-29 ENCOUNTER — Encounter: Payer: Self-pay | Admitting: *Deleted

## 2022-04-04 ENCOUNTER — Other Ambulatory Visit: Payer: Self-pay | Admitting: Physician Assistant

## 2022-04-14 ENCOUNTER — Telehealth: Payer: Medicare Other

## 2022-04-18 ENCOUNTER — Other Ambulatory Visit: Payer: Self-pay | Admitting: Internal Medicine

## 2022-05-18 ENCOUNTER — Encounter: Payer: Self-pay | Admitting: Internal Medicine

## 2022-06-03 ENCOUNTER — Telehealth: Payer: Self-pay | Admitting: Internal Medicine

## 2022-06-03 MED ORDER — ONDANSETRON HCL 4 MG PO TABS: 4.0000 mg | ORAL_TABLET | Freq: Two times a day (BID) | ORAL | 0 refills | Status: DC | PRN

## 2022-06-03 NOTE — Telephone Encounter (Signed)
Rx sent 

## 2022-06-03 NOTE — Telephone Encounter (Signed)
Prescription Request  06/03/2022  Is this a "Controlled Substance" medicine? No  LOV: 03/12/2022  What is the name of the medication or equipment?  ondansetron (ZOFRAN) 4 MG tablet   Have you contacted your pharmacy to request a refill? No   Which pharmacy would you like this sent to?  [id number] - [place], [place] - [address] [place] [address] Phone: [phone] Fax: [phone]    Patient notified that their request is being sent to the clinical staff for review and that they should receive a response within 2 business days.   Please advise at Mobile [phone] (mobile)

## 2022-06-03 NOTE — Addendum Note (Signed)
Addended byConrad Kibler D on: 06/03/2022 01:19 PM   Modules accepted: Orders

## 2022-06-24 ENCOUNTER — Emergency Department (HOSPITAL_BASED_OUTPATIENT_CLINIC_OR_DEPARTMENT_OTHER): Payer: Medicare Other

## 2022-06-24 ENCOUNTER — Emergency Department (HOSPITAL_BASED_OUTPATIENT_CLINIC_OR_DEPARTMENT_OTHER)
Admission: EM | Admit: 2022-06-24 | Discharge: 2022-06-24 | Disposition: A | Discharge status: Home / Self Care | Payer: Medicare Other | Attending: Emergency Medicine | Admitting: Emergency Medicine

## 2022-06-24 ENCOUNTER — Telehealth: Payer: Self-pay | Admitting: Cardiology

## 2022-06-24 ENCOUNTER — Encounter (HOSPITAL_BASED_OUTPATIENT_CLINIC_OR_DEPARTMENT_OTHER): Payer: Self-pay | Admitting: Pediatrics

## 2022-06-24 ENCOUNTER — Other Ambulatory Visit: Payer: Self-pay

## 2022-06-24 DIAGNOSIS — R404 Transient alteration of awareness: Secondary | ICD-10-CM | POA: Diagnosis not present

## 2022-06-24 DIAGNOSIS — R000 Tachycardia, unspecified: Secondary | ICD-10-CM | POA: Diagnosis not present

## 2022-06-24 DIAGNOSIS — R443 Hallucinations, unspecified: Secondary | ICD-10-CM | POA: Insufficient documentation

## 2022-06-24 DIAGNOSIS — I110 Hypertensive heart disease with heart failure: Secondary | ICD-10-CM | POA: Insufficient documentation

## 2022-06-24 DIAGNOSIS — I509 Heart failure, unspecified: Secondary | ICD-10-CM | POA: Insufficient documentation

## 2022-06-24 DIAGNOSIS — R109 Unspecified abdominal pain: Secondary | ICD-10-CM | POA: Diagnosis not present

## 2022-06-24 DIAGNOSIS — R110 Nausea: Secondary | ICD-10-CM | POA: Insufficient documentation

## 2022-06-24 DIAGNOSIS — R600 Localized edema: Secondary | ICD-10-CM | POA: Diagnosis not present

## 2022-06-24 DIAGNOSIS — Z79899 Other long term (current) drug therapy: Secondary | ICD-10-CM | POA: Diagnosis not present

## 2022-06-24 DIAGNOSIS — R410 Disorientation, unspecified: Secondary | ICD-10-CM | POA: Diagnosis not present

## 2022-06-24 DIAGNOSIS — I10 Essential (primary) hypertension: Secondary | ICD-10-CM | POA: Diagnosis not present

## 2022-06-24 DIAGNOSIS — R4182 Altered mental status, unspecified: Secondary | ICD-10-CM | POA: Diagnosis present

## 2022-06-24 DIAGNOSIS — R519 Headache, unspecified: Secondary | ICD-10-CM | POA: Diagnosis not present

## 2022-06-24 DIAGNOSIS — R1110 Vomiting, unspecified: Secondary | ICD-10-CM | POA: Diagnosis not present

## 2022-06-24 LAB — AMMONIA: Ammonia: 13 umol/L (ref 9–35)

## 2022-06-24 LAB — URINALYSIS, W/ REFLEX TO CULTURE (INFECTION SUSPECTED)
Glucose, UA: NEGATIVE mg/dL
Hgb urine dipstick: NEGATIVE
Ketones, ur: NEGATIVE mg/dL
Leukocytes,Ua: NEGATIVE
Nitrite: NEGATIVE
Protein, ur: 30 mg/dL — AB
Specific Gravity, Urine: 1.025 (ref 1.005–1.030)
pH: 6 (ref 5.0–8.0)

## 2022-06-24 LAB — CBC WITH DIFFERENTIAL/PLATELET
Abs Immature Granulocytes: 0.02 10*3/uL (ref 0.00–0.07)
Basophils Absolute: 0 10*3/uL (ref 0.0–0.1)
Basophils Relative: 1 %
Eosinophils Absolute: 0.1 10*3/uL (ref 0.0–0.5)
Eosinophils Relative: 1 %
HCT: 36.4 % (ref 36.0–46.0)
Hemoglobin: 12 g/dL (ref 12.0–15.0)
Immature Granulocytes: 0 %
Lymphocytes Relative: 25 %
Lymphs Abs: 1.6 10*3/uL (ref 0.7–4.0)
MCH: 29.7 pg (ref 26.0–34.0)
MCHC: 33 g/dL (ref 30.0–36.0)
MCV: 90.1 fL (ref 80.0–100.0)
Monocytes Absolute: 0.4 10*3/uL (ref 0.1–1.0)
Monocytes Relative: 6 %
Neutro Abs: 4.3 10*3/uL (ref 1.7–7.7)
Neutrophils Relative %: 67 %
Platelets: 160 10*3/uL (ref 150–400)
RBC: 4.04 MIL/uL (ref 3.87–5.11)
RDW: 13.2 % (ref 11.5–15.5)
WBC: 6.4 10*3/uL (ref 4.0–10.5)
nRBC: 0 % (ref 0.0–0.2)

## 2022-06-24 LAB — COMPREHENSIVE METABOLIC PANEL
ALT: 8 U/L (ref 0–44)
AST: 13 U/L — ABNORMAL LOW (ref 15–41)
Albumin: 4 g/dL (ref 3.5–5.0)
Alkaline Phosphatase: 60 U/L (ref 38–126)
Anion gap: 13 (ref 5–15)
BUN: 9 mg/dL (ref 8–23)
CO2: 26 mmol/L (ref 22–32)
Calcium: 8.8 mg/dL — ABNORMAL LOW (ref 8.9–10.3)
Chloride: 101 mmol/L (ref 98–111)
Creatinine, Ser: 0.88 mg/dL (ref 0.44–1.00)
GFR, Estimated: >60 mL/min (ref >60)
Glucose, Bld: 109 mg/dL — ABNORMAL HIGH (ref 70–99)
Potassium: 3.8 mmol/L (ref 3.5–5.1)
Sodium: 140 mmol/L (ref 135–145)
Total Bilirubin: 0.6 mg/dL (ref 0.3–1.2)
Total Protein: 6.5 g/dL (ref 6.5–8.1)

## 2022-06-24 LAB — BRAIN NATRIURETIC PEPTIDE: B Natriuretic Peptide: 66.6 pg/mL (ref 0.0–100.0)

## 2022-06-24 LAB — LACTIC ACID, PLASMA: Lactic Acid, Venous: 0.8 mmol/L (ref 0.5–1.9)

## 2022-06-24 LAB — TSH: TSH: 1.952 u[IU]/mL (ref 0.350–4.500)

## 2022-06-24 LAB — LIPASE, BLOOD: Lipase: 20 U/L (ref 11–51)

## 2022-06-24 LAB — TROPONIN I (HIGH SENSITIVITY)
Troponin I (High Sensitivity): 6 ng/L (ref <18)
Troponin I (High Sensitivity): 8 ng/L (ref <18)

## 2022-06-24 MED ORDER — IOHEXOL 300 MG/ML  SOLN
100.0000 mL | Freq: Once | INTRAMUSCULAR | Status: AC | PRN
  Administered 2022-06-24: 100 mL via INTRAVENOUS

## 2022-06-24 NOTE — Discharge Instructions (Signed)
Your history, exam, and evaluation today did not reveal a clear cause of your hallucinations and intermittent delirium yesterday.  We spoke to neurology who felt that it was reasonable to offer discharge to follow-up with your outpatient neurology team, PCP, cardiology team, and GI teams.  Your workup was overall reassuring including the imaging we did today.  We discussed admission for EEG and MRI however given your improvement in symptoms and lack of symptoms today, we agree with discharge home.  Please follow-up with your other teams.  If any symptoms change or worsen acutely, please return to the nearest emergency department.

## 2022-06-24 NOTE — ED Provider Notes (Signed)
[name] Provider Note   CSN: [id number] Arrival date & time: 06/24/22  1000     History  Chief Complaint  Patient presents with   Altered Mental Status    [name] is a 77 y.o. female.  The history is provided by the patient and medical records. No language interpreter was used.  Altered Mental Status Presenting symptoms: confusion   Presenting symptoms comment:  Hallucinations Severity:  Moderate Most recent episode:  Yesterday Episode history:  Continuous Timing:  Constant Progression:  Resolved Chronicity:  New Context: not dementia, not recent illness and not recent infection   Associated symptoms: abdominal pain, bladder incontinence (chronic and unchanged per pt), difficulty breathing, hallucinations and nausea   Associated symptoms: no agitation, no fever, no headaches, no light-headedness, no palpitations, no rash, no seizures, no slurred speech, no vomiting and no weakness        Home Medications Prior to Admission medications   Medication Sig Start Date End Date Taking? Authorizing Provider  albuterol (VENTOLIN HFA) 108 (90 Base) MCG/ACT inhaler TAKE 2 PUFFS BY MOUTH EVERY 6 HOURS AS NEEDED FOR WHEEZE OR SHORTNESS OF BREATH 04/19/22   Wanda Plump, MD  Cholecalciferol (VITAMIN D) 125 MCG (5000 UT) CAPS Take 5,000 Units by mouth daily.    [provider]  Cyanocobalamin (VITAMIN B12) 1000 MCG TBCR Take 1 tablet by mouth daily.    [provider]  dicyclomine (BENTYL) 20 MG tablet Take 1 tablet (20 mg total) by mouth 4 (four) times daily -  before meals and at bedtime. 01/13/22   Meryl Dare, MD  ENTRESTO 97-103 MG TAKE 1 TABLET TWICE A DAY 03/05/21   Jake Bathe, MD  HYDROcodone-acetaminophen (NORCO) 10-325 MG tablet Take 1 tablet by mouth every 6 (six) hours as needed. Patient not taking: Reported on 03/12/2022 08/15/20   [provider]  loperamide (IMODIUM) 2 MG capsule Patient  takes 2-4 tablets by mouth as needed for diarrhea or loose stools    [provider]  metoprolol succinate (TOPROL-XL) 50 MG 24 hr tablet TAKE 1 TABLET BY MOUTH DAILY. TAKE WITH OR IMMEDIATELY FOLLOWING A MEAL. 04/05/22   Jake Bathe, MD  NARCAN 4 MG/0.1ML LIQD nasal spray kit Place 1 spray into the nose once as needed (opioid od). Patient not taking: Reported on 03/12/2022 04/24/18   [provider]  nitroGLYCERIN (NITROSTAT) 0.4 MG SL tablet Place 1 tablet (0.4 mg total) under the tongue every 5 (five) minutes as needed for chest pain. Patient not taking: Reported on 03/12/2022 02/20/21 12/24/21  Tereso Newcomer T, PA-C  omeprazole (PRILOSEC) 40 MG capsule Take 1 capsule (40 mg total) by mouth 2 (two) times daily. 01/13/22   Meryl Dare, MD  ondansetron (ZOFRAN) 4 MG tablet Take 1 tablet (4 mg total) by mouth 2 (two) times daily as needed for nausea or vomiting. 06/03/22   Wanda Plump, MD  pregabalin (LYRICA) 75 MG capsule Take 75 mg by mouth 3 (three) times daily. 08/15/20   [provider]  promethazine (PHENERGAN) 25 MG tablet Take 25 mg by mouth every 6 (six) hours as needed for nausea or vomiting. Patient not taking: Reported on 03/12/2022    [provider]  Propylene Glycol 0.6 % SOLN Apply 2 drops to eye as needed (for dry eyes).    [provider]  rOPINIRole (REQUIP) 1 MG tablet TAKE 1 TABLET BY MOUTH TWICE A DAY 04/19/22  Willow Ora E, MD  rosuvastatin (CRESTOR) 20 MG tablet TAKE 1 TABLET BY MOUTH EVERY DAY 01/26/22   Jake Bathe, MD  sertraline (ZOLOFT) 50 MG tablet Take 1.5 tablets (75 mg total) by mouth daily. 01/15/22   Wanda Plump, MD  traZODone (DESYREL) 50 MG tablet Take 3 tablets (150 mg total) by mouth daily as needed for sleep. 03/12/22   Wanda Plump, MD      Allergies    Dextromethorphan-guaifenesin, Morphine, Gabapentin, and Penicillins    Review of Systems   Review of Systems  Constitutional:  Negative for chills, fatigue and  fever.  HENT:  Negative for congestion.   Eyes:  Negative for visual disturbance.  Respiratory:  Positive for cough. Negative for chest tightness, shortness of breath and wheezing.   Cardiovascular:  Positive for chest pain and leg swelling. Negative for palpitations.  Gastrointestinal:  Positive for abdominal pain and nausea. Negative for constipation, diarrhea and vomiting.  Genitourinary:  Positive for bladder incontinence (chronic and unchanged per pt). Negative for dysuria, flank pain and frequency.  Musculoskeletal:  Negative for back pain, neck pain and neck stiffness.  Skin:  Negative for rash and wound.  Neurological:  Negative for seizures, syncope, speech difficulty, weakness, light-headedness, numbness and headaches.  Psychiatric/Behavioral:  Positive for confusion and hallucinations. Negative for agitation.   All other systems reviewed and are negative.   Physical Exam Updated Vital Signs BP (!) 142/78 (BP Location: Right Arm)   Pulse 84   Temp 98.2 F (36.8 C) (Oral)   Resp 16   Ht 5\' 5"  (1.651 m)   Wt 105.7 kg   SpO2 96%   BMI 38.77 kg/m  Physical Exam Vitals and nursing note reviewed.  Constitutional:      General: She is not in acute distress.    Appearance: She is well-developed. She is not ill-appearing or diaphoretic.  HENT:     Head: Normocephalic and atraumatic.     Nose: No congestion.     Mouth/Throat:     Mouth: Mucous membranes are moist.     Pharynx: No oropharyngeal exudate or posterior oropharyngeal erythema.  Eyes:     Extraocular Movements: Extraocular movements intact.     Conjunctiva/sclera: Conjunctivae normal.     Pupils: Pupils are equal, round, and reactive to light.  Cardiovascular:     Rate and Rhythm: Regular rhythm. Tachycardia present.     Pulses: Normal pulses.     Heart sounds: No murmur heard. Pulmonary:     Effort: Pulmonary effort is normal. No respiratory distress.     Breath sounds: Rhonchi and rales present. No wheezing.   Chest:     Chest wall: No tenderness.  Abdominal:     General: Abdomen is flat.     Palpations: Abdomen is soft.     Tenderness: There is abdominal tenderness. There is no right CVA tenderness, left CVA tenderness, guarding or rebound.  Musculoskeletal:        General: Tenderness present. No swelling.     Cervical back: Neck supple. No tenderness.     Right lower leg: Edema present.     Left lower leg: Edema present.  Skin:    General: Skin is warm and dry.     Capillary Refill: Capillary refill takes less than 2 seconds.     Findings: No erythema or rash.  Neurological:     General: No focal deficit present.     Mental Status: She is alert.  Sensory: No sensory deficit.     Motor: No weakness.  Psychiatric:        Mood and Affect: Mood normal.     ED Results / Procedures / Treatments   Labs (all labs ordered are listed, but only abnormal results are displayed) Labs Reviewed  COMPREHENSIVE METABOLIC PANEL - Abnormal; Notable for the following components:      Result Value   Glucose, Bld 109 (*)    Calcium 8.8 (*)    AST 13 (*)    All other components within normal limits  URINALYSIS, W/ REFLEX TO CULTURE (INFECTION SUSPECTED) - Abnormal; Notable for the following components:   Bilirubin Urine SMALL (*)    Protein, ur 30 (*)    Bacteria, UA FEW (*)    All other components within normal limits  CBC WITH DIFFERENTIAL/PLATELET  AMMONIA  TSH  BRAIN NATRIURETIC PEPTIDE  LACTIC ACID, PLASMA  LIPASE, BLOOD  LACTIC ACID, PLASMA  TROPONIN I (HIGH SENSITIVITY)  TROPONIN I (HIGH SENSITIVITY)    EKG EKG Interpretation  Date/Time:  Thursday Jun 24 2022 10:21:52 EDT Ventricular Rate:  79 PR Interval:  146 QRS Duration: 145 QT Interval:  419 QTC Calculation: 481 R Axis:   44 Text Interpretation: Sinus rhythm Right bundle branch block when compared to prior, similar appearance. No STEMI Confirmed by Theda Belfast ([id number]) on 06/24/2022 10:38:53 AM  Radiology CT  CHEST ABDOMEN PELVIS W CONTRAST  Result Date: 06/24/2022 CLINICAL DATA:  Abdominal pain, chills, delirium, vomiting, cough EXAM: CT CHEST, ABDOMEN, AND PELVIS WITH CONTRAST TECHNIQUE: Multidetector CT imaging of the chest, abdomen and pelvis was performed following the standard protocol during bolus administration of intravenous contrast. RADIATION DOSE REDUCTION: This exam was performed according to the departmental dose-optimization program which includes automated exposure control, adjustment of the mA and/or kV according to patient size and/or use of iterative reconstruction technique. CONTRAST:  OMNIPAQUE IOHEXOL 300 MG/ML  SOLN COMPARISON:  Multiple exams, including CT abdomen 11/11/2020 FINDINGS: CT CHEST FINDINGS Cardiovascular: Coronary, aortic arch, and branch vessel atherosclerotic vascular disease. Mild cardiomegaly. Mediastinum/Nodes: Unremarkable Lungs/Pleura: Unremarkable Musculoskeletal: Lower thoracic spondylosis. CT ABDOMEN PELVIS FINDINGS Hepatobiliary: Cholecystectomy.  Otherwise unremarkable. Pancreas: Unremarkable Spleen: Unremarkable Adrenals/Urinary Tract: Trace amount of gas in the urinary bladder, query recent catheterization. The kidneys and adrenal glands appear normal. Stomach/Bowel: Unremarkable Vascular/Lymphatic: Atherosclerosis is present, including aortoiliac atherosclerotic disease. Reproductive: Uterus absent.  Adnexa unremarkable. Other: No supplemental non-categorized findings. Musculoskeletal: Interbody prosthesis at L5-S1. Lumbar spondylosis and degenerative disc disease contribute to at least moderate right foraminal impingement at L4-5 and mild left foraminal impingement at L3-4. IMPRESSION: 1. A specific cause for the patient's abdominal pain and vomiting is not identified. 2. Trace amount of gas in the urinary bladder, query recent catheterization. 3. Mild cardiomegaly. 4. Lumbar impingement at L3-4 and L4-5. 5. Aortic and coronary atherosclerosis. Aortic  Atherosclerosis (ICD10-I70.0). Electronically Signed   By: Gaylyn Rong M.D.   On: 06/24/2022 13:38   CT Head Wo Contrast  Result Date: 06/24/2022 CLINICAL DATA:  Psychosis Delirium yesterday with hallucinations. Today mental status improved. Also recent abdominal pain with nausea and vomiting and cough with soft oxygen saturations. EXAM: CT HEAD WITHOUT CONTRAST TECHNIQUE: Contiguous axial images were obtained from the base of the skull through the vertex without intravenous contrast. RADIATION DOSE REDUCTION: This exam was performed according to the departmental dose-optimization program which includes automated exposure control, adjustment of the mA and/or kV according to patient size and/or use of iterative reconstruction technique. COMPARISON:  CT head 02/08/21 FINDINGS: Brain: No evidence of acute infarction, hemorrhage, hydrocephalus, extra-axial collection or mass lesion/mass effect. Vascular: No hyperdense vessel or unexpected calcification. Skull: Normal. Negative for fracture or focal lesion. Sinuses/Orbits: No middle ear or mastoid effusion. Paranasal sinuses are clear. Orbits are unremarkable. Other: None. IMPRESSION: No acute intracranial abnormality. Electronically Signed   By: Lorenza Cambridge M.D.   On: 06/24/2022 11:42   DG Chest Portable 1 View  Result Date: 06/24/2022 CLINICAL DATA:  Confusion EXAM: PORTABLE CHEST 1 VIEW COMPARISON:  12/07/2019 FINDINGS: Cardiac and mediastinal contours are within normal limits. No focal pulmonary opacity. No pleural effusion or pneumothorax. No acute osseous abnormality. IMPRESSION: No acute cardiopulmonary process. Electronically Signed   By: Wiliam Ke M.D.   On: 06/24/2022 11:37    Procedures Procedures    Medications Ordered in ED Medications  iohexol (OMNIPAQUE) 300 MG/ML solution 100 mL (100 mLs Intravenous Contrast Given 06/24/22 1256)    ED Course/ Medical Decision Making/ A&P                             Medical Decision  Making Amount and/or Complexity of Data Reviewed Labs: ordered. Radiology: ordered.  Risk Prescription drug management.    ALANI FINKE is a 77 y.o. female with a past medical history significant for hypertension, CHF, carotid disease, previous pulmonary embolism not on anticoagulation, restless legs, fibromyalgia, IBS, and vertigo who presents with several complaints including 1 week of worsening peripheral edema, exertional shortness of breath, cough, intermittent chest pain, as well as several months of intermittent nausea and abdominal discomfort, and 1 day of due to hallucinations yesterday.  According to patient and family, yesterday patient had significant hallucinations where she thought she was with her son all day talking with him when in fact he was at work.  Patient reports that she both saw and heard him and had conversations with him throughout the day.  She thinks she is thinking more clearly now but is very confused about that.  No history of hallucinations or acute delirium.  She does reports she has had months of abdominal discomfort with nausea and occasional vomiting.  She reports her abdomen is slightly painful now.  She reports has had some cough recently but is also had chest pain and shortness of breath and peripheral edema worsening.  She has a history of heart failure.  Oxygen saturations around 92 to 94% on room air when I first evaluated patient at rest.  She denies any headache or neck pain and denies any recent head trauma.  Denies any speech difficulties or vision changes.  Denies any focal numbness, tingling, or weakness of extremities.  On exam, lungs had some rhonchi and rales.  Chest was nontender.  I do not appreciate a murmur initially.  Abdomen was tender diffusely and bowel sounds were appreciated.  Back and flanks nontender.  No focal neurologic deficit initially.  Pupils are symmetric and reactive with normal extract movements.  Normal finger-nose-finger testing.   Peripheral edema present in legs.  Intact pulses.  Had a shared decision-making conversation with patient about management.  We agreed to get CT head initially for the intermittent delirium/hallucinations.  We will get chest x-ray at first but if chest x-ray is nondiagnostic, will consider CT scan of the chest/abdomen/pelvis to rule out occult infection, heart failure exacerbation, pulmonary edema, or intra-abdominal pathology causing this abdominal pain with nausea.  Will get screening labs including ammonia  and TSH and will get urinalysis to look for occult infection.  Will ambulate patient to see if she gets hypoxic as I am concerned hypoxia could be the cause of her intermittent delirium yesterday.  Anticipate reassessment after workup to determine disposition.  Workup returned overall reassuring.  Urinalysis does not show convincing evidence of UTI, x-ray showed no pneumonia, and CT scan is reassuring as well with no clear identifying cause of the abdominal pain and nausea.  CT head showed no acute abnormalities.  We discussed all the findings together and then I spoke to neurology.  Neurology said that she needed to be admitted for EEG and MRI but as she is at baseline and to see neurology for, they felt is reasonable have her follow-up with her outpatient neurology team to discuss outpatient further management.  Patient agrees and would like to go home.  Patient will follow-up with her outpatient PCP, neurology, cardiology, and GI teams.  She had no other questions or concerns and was discharged in good condition.         Final Clinical Impression(s) / ED Diagnoses Final diagnoses:  Hallucination  Altered awareness, transient    Rx / DC Orders ED Discharge Orders     None      Clinical Impression: 1. Hallucination   2. Altered awareness, transient     Disposition: Discharge  Condition: Good  I have discussed the results, Dx and Tx plan with the pt(& family if present).  He/she/they expressed understanding and agree(s) with the plan. Discharge instructions discussed at great length. Strict return precautions discussed and pt &/or family have verbalized understanding of the instructions. No further questions at time of discharge.    New Prescriptions   No medications on file    Follow Up: [facility] [address]     [address] [place] [address] [phone]    Wanda Plump, MD [address] [place] [address] [phone]     [facility] Emergency Department at [facility] [address] [id number] [address] [facility] [place] [address] [phone]       Cylan Borum, Canary Brim, MD 06/24/22 [phone]

## 2022-06-24 NOTE — Plan of Care (Signed)
On-call phone consult  Called by Dr. Rush Landmark at bedside at [facility].  77 year old, yesterday with AMS, reports that she talked to son (who was not there) - florid visual and auditory hallucinations suspected-now resolved.Now back to baseline. CT and Labs OK Meds include pain meds and Dopa agonists for RLS. Maybe med related, but would need follow up. Suspect baseline, can follow outpatient neurology. Would leave it up to the patient to decide if they want to stay for inpatient evaluation which would include an EEG and an MRI-do not see a need for emergent inpatient consultation at this time. Discussed with Dr. Regular  -- Milon Dikes, MD Neurologist [facility] Pager: [phone]

## 2022-06-24 NOTE — ED Triage Notes (Addendum)
Son at bedside reports concern for confusion. Stated last well known yesterday around 15 before he left for work and around 11 am patient sounded confused via text messages. Patient stated she thought that her son was with her all day yesterday. A&0 x 4. Reports some paresthesia on both side of face when touch. Denies unilateral weakness.

## 2022-06-24 NOTE — Telephone Encounter (Signed)
Pt son [name] ( listed on DPR) called concerned that pt possibly had a stroke yesterday, the do reside in the same household.  Pt son mentioned yesterday his mother asked repeated questions and had difficulty breathing, pt refused to go to the ED. Pt son is currently at work, pt is currently at home alone and mentioned his mother is currently symptomatic. Advised pt son to immediately call 911 or immediately take pt to the ED. Pt son voiced understanding. Will forward to MD and nurse.

## 2022-06-24 NOTE — Telephone Encounter (Signed)
Pt c/o BP issue: STAT if pt c/o blurred vision, one-sided weakness or slurred speech  1. What are your last 5 BP readings?   2. Are you having any other symptoms (ex. Dizziness, headache, blurred vision, passed out)?   Blurred speech, trouble breathing  3. What is your BP issue?    Son states he believes his mother is having a stroke.  Son is not with patient at this time.

## 2022-06-28 ENCOUNTER — Telehealth: Payer: Self-pay | Admitting: Internal Medicine

## 2022-06-28 NOTE — Telephone Encounter (Signed)
Pt was seen in the ER onb  06/24/22  for a stroke and was told to follow up with her PCP but they did not give her a time frame on when to be seen. She needs a Friday but provider does not have an opening on a Friday until 6/7. Pt will check with her son to see if he can squeeze an appt in at another time or another day. Please advise time frame for follow up or if patient should see someone else sooner.

## 2022-06-28 NOTE — Telephone Encounter (Signed)
She may need to see another provider in office if PCP full.

## 2022-07-01 NOTE — Progress Notes (Signed)
Subjective:   By signing my name below, I, [name], attest that this documentation has been prepared under the direction and in the presence of [name], DO. 07/02/2022.   Patient ID: [name], female    DOB: [birth year], 77 y.o.   MRN: [id number]  Chief Complaint  Patient presents with   ER follow up    HPI Patient is in today for post ER follow-up. She is being seen for her PCP [name] as she has arranged transportation today and there were no other appointments available.   On 06/24/2022 she had presented to the ED with complaints of hallucinations x1 day. She thought she was seeing, hearing, and conversing with her son all day when in fact he was at work. Also she complained of worsening peripheral edema, DOE, cough, chest pain, and several months of intermittent nausea and abdominal discomfort. There was concern for hypoxia as the etiology of her delirium. Workup returned overall reassuring. Urinalysis was unconvincing for UTI, x-ray was negative for pneumonia, and CT scan was reassuring with no clear identifying cause of the abdominal pain and nausea. CT head showed no acute abnormalities. She was recommended for outpatient follow-up with her PCP, neurology, cardiology, and GI teams. Discharged in good condition.   Today, she is accompanied by her son. Her son reports that her longest episode of hallucinations was the day prior to her ER visit. Then there was concern for possible stroke as she had a second hallucinatory episode the morning of her ER visit. She notes that she had never experienced hallucinations prior to her recent episodes.  She does complain of urinary frequency and some incontinence. We will perform a repeat urinalysis today. This was found to be positive for UTI.  She continues to experience intermittent headaches. She states that it sometimes feels like somebody is striking her in the head.   Past Medical History:  Diagnosis Date   Allergy     Anemia    Anxiety    Barrett's esophagus    Bilateral carpal tunnel syndrome    Dr. Renae Fickle, having injection therapy   C. difficile diarrhea 08/01/2012   severe 2014   Carotid arterial disease (HCC)    Cataract    removed both eyes    Chronic combined systolic and diastolic CHF (congestive heart failure) (HCC)    Chronic cystitis    Dr. Sherron Monday   Chronic lower back pain    Chronic pain syndrome    Depression    Dizziness    DJD (degenerative joint disease)    bilateral hands; knees   Family history of anesthesia complication    Mother had severe N/V   Fibromyalgia    GERD (gastroesophageal reflux disease)    H/O cardiac catheterization    (-) cath 12-2002  , cath again 2011 (-)   History of colon polyps    History of nuclear stress test    Myoview 3/23: EF 51, no ischemia; low risk (echocardiogram in 11/22 with normal EF)   HTN (hypertension)    Hyperlipidemia    past hx    IBS (irritable bowel syndrome)    Insomnia    Migraine    Morbid obesity (HCC)    Non-ischemic cardiomyopathy (HCC)    Osteoporosis    pt unsure of this   RLS (restless legs syndrome)    Vertigo    Vitamin B 12 deficiency 04/09/2013    Past Surgical History:  Procedure Laterality Date  Arm surgery Left    "don't remember what they did; arm wasn't broken"   BILATERAL KNEE ARTHROSCOPY Bilateral    BIOPSY  08/07/2018   Procedure: BIOPSY;  Surgeon: Meryl Dare, MD;  Location: [facility];  Service: Endoscopy;;   CARDIAC CATHETERIZATION  01/22/10   clean cath   CATARACT EXTRACTION W/ INTRAOCULAR LENS  IMPLANT, BILATERAL Bilateral    CHOLECYSTECTOMY N/A 10/25/2016   Procedure: LAPAROSCOPIC CHOLECYSTECTOMY;  Surgeon: Violeta Gelinas, MD;  Location: [facility] OR;  Service: General;  Laterality: N/A;   COLONOSCOPY     COLONOSCOPY W/ POLYPECTOMY     ESOPHAGOGASTRODUODENOSCOPY (EGD) WITH PROPOFOL N/A 08/07/2018   Procedure: ESOPHAGOGASTRODUODENOSCOPY (EGD) WITH PROPOFOL;  Surgeon: Meryl Dare,  MD;  Location: [facility];  Service: Endoscopy;  Laterality: N/A;   FOOT SURGERY Bilateral    toenails removed; callus removed on right; hammertoes right"   Knot     "removed from right neck; not a goiter"   LUMBAR DISC SURGERY     L5 S1 anterior fusion   OOPHORECTOMY     RIGHT/LEFT HEART CATH AND CORONARY ANGIOGRAPHY N/A 05/06/2017   Procedure: RIGHT/LEFT HEART CATH AND CORONARY ANGIOGRAPHY;  Surgeon:  Kendall, MD;  Location: [facility];  Service: Cardiovascular;  Laterality: N/A;   SHOULDER ARTHROSCOPY Right    TOTAL KNEE ARTHROPLASTY  10/05/2011   Procedure: TOTAL KNEE ARTHROPLASTY;  Surgeon: Velna Ochs, MD;  Location: [facility] OR;  Service: Orthopedics;  Laterality: Right;   UPPER GASTROINTESTINAL ENDOSCOPY     VAGINAL HYSTERECTOMY     for endometriosis    Family History  Problem Relation Age of Onset   Lung cancer Mother    Dementia Father    CAD Father        dx in his 95s   Stroke Father    Congestive Heart Failure Father    Colon cancer Maternal Grandfather    Esophageal cancer Brother    Breast cancer Sister    Diabetes Other        Aunt   Colon cancer Maternal Uncle        2   Hepatitis C Brother    Rectal cancer Neg Hx    Stomach cancer Neg Hx    Colon polyps Neg Hx     Social History   Socioeconomic History   Marital status: Divorced    Spouse name: Not on file   Number of children: 2   Years of education: 12   Highest education level: High school graduate  Occupational History   Occupation: Retired, post Sales promotion account executive: RETIRED  Tobacco Use   Smoking status: Former    Packs/day: 0.50    Years: 10.00    Additional pack years: 0.00    Total pack years: 5.00    Types: Cigarettes    Quit date: 02/09/1975    Years since quitting: 47.4   Smokeless tobacco: Never   Tobacco comments:    started at age 89.   quit in the 25s.  Vaping Use   Vaping Use: Never used  Substance and Sexual Activity   Alcohol use: Not Currently   Drug  use: No    Comment: CBD and hemp OIL    Sexual activity: Not Currently    Partners: Male  Other Topics Concern   Not on file  Social History Narrative   Son lives with her.   Right-handed.   2 soft drinks per day.   Social Determinants of  Health   Financial Resource Strain: Low Risk  (11/09/2021)   Overall Financial Resource Strain (CARDIA)    Difficulty of Paying Living Expenses: Not hard at all  Food Insecurity: No Food Insecurity (11/09/2021)   Hunger Vital Sign    Worried About Running Out of Food in the Last Year: Never true    Ran Out of Food in the Last Year: Never true  Transportation Needs: No Transportation Needs (11/09/2021)   PRAPARE - Administrator, Civil Service (Medical): No    Lack of Transportation (Non-Medical): No  Physical Activity: Inactive (11/09/2021)   Exercise Vital Sign    Days of Exercise per Week: 0 days    Minutes of Exercise per Session: 0 min  Stress: No Stress Concern Present (11/09/2021)   [employer] of Occupational Health - Occupational Stress Questionnaire    Feeling of Stress : Not at all  Social Connections: Socially Isolated (11/09/2021)   Social Connection and Isolation Panel [NHANES]    Frequency of Communication with Friends and Family: More than three times a week    Frequency of Social Gatherings with Friends and Family: More than three times a week    Attends Religious Services: Never    Database administrator or Organizations: No    Attends Banker Meetings: Never    Marital Status: Divorced  Catering manager Violence: Not At Risk (11/09/2021)   Humiliation, Afraid, Rape, and Kick questionnaire    Fear of Current or Ex-Partner: No    Emotionally Abused: No    Physically Abused: No    Sexually Abused: No    Outpatient Medications Prior to Visit  Medication Sig Dispense Refill   albuterol (VENTOLIN HFA) 108 (90 Base) MCG/ACT inhaler TAKE 2 PUFFS BY MOUTH EVERY 6 HOURS AS NEEDED FOR WHEEZE OR SHORTNESS  OF BREATH 18 each 5   Cholecalciferol (VITAMIN D) 125 MCG (5000 UT) CAPS Take 5,000 Units by mouth daily.     Cyanocobalamin (VITAMIN B12) 1000 MCG TBCR Take 1 tablet by mouth daily.     dicyclomine (BENTYL) 20 MG tablet Take 1 tablet (20 mg total) by mouth 4 (four) times daily -  before meals and at bedtime. 360 tablet 3   ENTRESTO 97-103 MG TAKE 1 TABLET TWICE A DAY 180 tablet 3   loperamide (IMODIUM) 2 MG capsule Patient takes 2-4 tablets by mouth as needed for diarrhea or loose stools     metoprolol succinate (TOPROL-XL) 50 MG 24 hr tablet TAKE 1 TABLET BY MOUTH DAILY. TAKE WITH OR IMMEDIATELY FOLLOWING A MEAL. 90 tablet 2   omeprazole (PRILOSEC) 40 MG capsule Take 1 capsule (40 mg total) by mouth 2 (two) times daily. 180 capsule 3   ondansetron (ZOFRAN) 4 MG tablet Take 1 tablet (4 mg total) by mouth 2 (two) times daily as needed for nausea or vomiting. 30 tablet 0   pregabalin (LYRICA) 75 MG capsule Take 75 mg by mouth 3 (three) times daily.     Propylene Glycol 0.6 % SOLN Apply 2 drops to eye as needed (for dry eyes).     rOPINIRole (REQUIP) 1 MG tablet TAKE 1 TABLET BY MOUTH TWICE A DAY 180 tablet 1   rosuvastatin (CRESTOR) 20 MG tablet TAKE 1 TABLET BY MOUTH EVERY DAY 90 tablet 3   sertraline (ZOLOFT) 50 MG tablet Take 1.5 tablets (75 mg total) by mouth daily. 135 tablet 1   traZODone (DESYREL) 50 MG tablet Take 3 tablets (150 mg total) by  mouth daily as needed for sleep. 270 tablet 1   HYDROcodone-acetaminophen (NORCO) 10-325 MG tablet Take 1 tablet by mouth every 6 (six) hours as needed. (Patient not taking: Reported on 03/12/2022)     NARCAN 4 MG/0.1ML LIQD nasal spray kit Place 1 spray into the nose once as needed (opioid od). (Patient not taking: Reported on 03/12/2022)     nitroGLYCERIN (NITROSTAT) 0.4 MG SL tablet Place 1 tablet (0.4 mg total) under the tongue every 5 (five) minutes as needed for chest pain. (Patient not taking: Reported on 03/12/2022) 90 tablet 3   promethazine  (PHENERGAN) 25 MG tablet Take 25 mg by mouth every 6 (six) hours as needed for nausea or vomiting. (Patient not taking: Reported on 03/12/2022)     No facility-administered medications prior to visit.    Allergies  Allergen Reactions   Dextromethorphan-Guaifenesin Nausea And Vomiting   Morphine Nausea And Vomiting   Gabapentin Other (See Comments)    Dizziness, made her feel loopy   Penicillins Rash    "> 30 years ago; best I can remember it was just a light rash on my arm" Did it involve swelling of the face/tongue/throat, SOB, or low BP? No Did it involve sudden or severe rash/hives, skin peeling, or any reaction on the inside of your mouth or nose? Unknown Did you need to seek medical attention at a hospital or doctor's office? No When did it last happen?      more than 30 years  If all above answers are "NO", may proceed with cephalosporin use.     Review of Systems  Constitutional:  Negative for fever and malaise/fatigue.  HENT:  Negative for congestion.   Eyes:  Negative for blurred vision.  Respiratory:  Negative for shortness of breath.   Cardiovascular:  Negative for chest pain and palpitations.  Gastrointestinal:  Positive for abdominal pain. Negative for blood in stool.  Genitourinary:  Positive for frequency.  Musculoskeletal:  Negative for falls.  Skin:  Negative for rash.  Neurological:  Positive for headaches. Negative for dizziness and loss of consciousness.  Endo/Heme/Allergies:  Negative for environmental allergies.  Psychiatric/Behavioral:  Negative for depression. The patient is not nervous/anxious.        Objective:    Physical Exam Vitals and nursing note reviewed.  Constitutional:      Appearance: Normal appearance.  HENT:     Head: Normocephalic and atraumatic.     Right Ear: Tympanic membrane, ear canal and external ear normal.     Left Ear: Tympanic membrane, ear canal and external ear normal.  Eyes:     Extraocular Movements: Extraocular  movements intact.     Pupils: Pupils are equal, round, and reactive to light.  Cardiovascular:     Rate and Rhythm: Normal rate and regular rhythm.     Heart sounds: Normal heart sounds. No murmur heard.    No gallop.  Pulmonary:     Effort: Pulmonary effort is normal. No respiratory distress.     Breath sounds: Normal breath sounds. No wheezing or rales.  Skin:    General: Skin is warm and dry.  Neurological:     General: No focal deficit present.     Mental Status: She is alert and oriented to person, place, and time.  Psychiatric:        Mood and Affect: Mood normal.        Behavior: Behavior normal.     BP 120/70 (BP Location: Right Arm, Patient Position: Sitting, Cuff  Size: Large)   Pulse 91   Temp 98.4 F (36.9 C) (Oral)   Resp 20   Ht 5\' 5"  (1.651 m)   Wt 238 lb 6.4 oz (108.1 kg)   SpO2 98%   BMI 39.67 kg/m  Wt Readings from Last 3 Encounters:  07/02/22 238 lb 6.4 oz (108.1 kg)  06/24/22 233 lb (105.7 kg)  03/12/22 245 lb 4 oz (111.2 kg)    Diabetic Foot Exam - Simple   No data filed    Lab Results  Component Value Date   WBC 6.4 06/24/2022   HGB 12.0 06/24/2022   HCT 36.4 06/24/2022   PLT 160 06/24/2022   GLUCOSE 109 (H) 06/24/2022   CHOL 144 09/04/2021   TRIG 132.0 09/04/2021   HDL 55.70 09/04/2021   LDLDIRECT 166.0 04/14/2010   LDLCALC 62 09/04/2021   ALT 8 06/24/2022   AST 13 (L) 06/24/2022   NA 140 06/24/2022   K 3.8 06/24/2022   CL 101 06/24/2022   CREATININE 0.88 06/24/2022   BUN 9 06/24/2022   CO2 26 06/24/2022   TSH 1.952 06/24/2022   INR 1.06 05/06/2017   HGBA1C 5.6 04/15/2020    Lab Results  Component Value Date   TSH 1.952 06/24/2022   Lab Results  Component Value Date   WBC 6.4 06/24/2022   HGB 12.0 06/24/2022   HCT 36.4 06/24/2022   MCV 90.1 06/24/2022   PLT 160 06/24/2022   Lab Results  Component Value Date   NA 140 06/24/2022   K 3.8 06/24/2022   CO2 26 06/24/2022   GLUCOSE 109 (H) 06/24/2022   BUN 9 06/24/2022    CREATININE 0.88 06/24/2022   BILITOT 0.6 06/24/2022   ALKPHOS 60 06/24/2022   AST 13 (L) 06/24/2022   ALT 8 06/24/2022   PROT 6.5 06/24/2022   ALBUMIN 4.0 06/24/2022   CALCIUM 8.8 (L) 06/24/2022   ANIONGAP 13 06/24/2022   EGFR 67 02/20/2021   GFR 59.31 (L) 03/12/2022   Lab Results  Component Value Date   CHOL 144 09/04/2021   Lab Results  Component Value Date   HDL 55.70 09/04/2021   Lab Results  Component Value Date   LDLCALC 62 09/04/2021   Lab Results  Component Value Date   TRIG 132.0 09/04/2021   Lab Results  Component Value Date   CHOLHDL 3 09/04/2021   Lab Results  Component Value Date   HGBA1C 5.6 04/15/2020       Assessment & Plan:   Problem List Items Addressed This Visit       Unprioritized   Urinary tract infection without hematuria    Keflex 500 mg bid x 7 days  Unable to do culture because there was not enough urine  F/u pcp in 2 weeks       Relevant Medications   cephALEXin (KEFLEX) 500 MG capsule   Incontinence in female   Relevant Medications   solifenacin (VESICARE) 5 MG tablet   Hallucination - Primary    Prob from uti Mri ordered and pt will f/u neuro per er  F/u pcp in 2 weeks       Relevant Orders   POCT Urinalysis Dipstick (Automated) (Completed)   MR Brain Wo Contrast   Ambulatory referral to Neurology     Meds ordered this encounter  Medications   cephALEXin (KEFLEX) 500 MG capsule    Sig: Take 1 capsule (500 mg total) by mouth 2 (two) times daily.    Dispense:  20  capsule    Refill:  0   solifenacin (VESICARE) 5 MG tablet    Sig: Take 1 tablet (5 mg total) by mouth daily.    Dispense:  90 tablet    Refill:  1    I, Donato Schultz, DO, personally preformed the services described in this documentation.  All medical record entries made by the scribe were at my direction and in my presence.  I have reviewed the chart and discharge instructions (if applicable) and agree that the record reflects my personal  performance and is accurate and complete. 07/02/2022.  I,Mathew Stumpf,acting as a Neurosurgeon for [employer], DO.,have documented all relevant documentation on the behalf of Donato Schultz, DO,as directed by  Donato Schultz, DO while in the presence of Donato Schultz, DO.   Donato Schultz, DO

## 2022-07-02 ENCOUNTER — Ambulatory Visit (INDEPENDENT_AMBULATORY_CARE_PROVIDER_SITE_OTHER): Payer: Medicare Other | Admitting: Family Medicine

## 2022-07-02 ENCOUNTER — Encounter: Payer: Self-pay | Admitting: Family Medicine

## 2022-07-02 ENCOUNTER — Ambulatory Visit: Payer: Medicare Other | Admitting: Family Medicine

## 2022-07-02 VITALS — BP 120/70 | HR 91 | Temp 98.4°F | Resp 20 | Ht 65.0 in | Wt 238.4 lb

## 2022-07-02 DIAGNOSIS — N390 Urinary tract infection, site not specified: Secondary | ICD-10-CM | POA: Diagnosis not present

## 2022-07-02 DIAGNOSIS — R443 Hallucinations, unspecified: Secondary | ICD-10-CM

## 2022-07-02 DIAGNOSIS — K580 Irritable bowel syndrome with diarrhea: Secondary | ICD-10-CM | POA: Diagnosis not present

## 2022-07-02 DIAGNOSIS — R1013 Epigastric pain: Secondary | ICD-10-CM | POA: Diagnosis not present

## 2022-07-02 DIAGNOSIS — R32 Unspecified urinary incontinence: Secondary | ICD-10-CM | POA: Diagnosis not present

## 2022-07-02 DIAGNOSIS — R112 Nausea with vomiting, unspecified: Secondary | ICD-10-CM | POA: Diagnosis not present

## 2022-07-02 DIAGNOSIS — K2270 Barrett's esophagus without dysplasia: Secondary | ICD-10-CM | POA: Diagnosis not present

## 2022-07-02 LAB — POC URINALSYSI DIPSTICK (AUTOMATED)
Glucose, UA: NEGATIVE
Nitrite, UA: POSITIVE
Protein, UA: POSITIVE — AB
Spec Grav, UA: 1.025 (ref 1.010–1.025)
Urobilinogen, UA: 0.2 E.U./dL
pH, UA: 5 (ref 5.0–8.0)

## 2022-07-02 MED ORDER — SOLIFENACIN SUCCINATE 5 MG PO TABS: 5.0000 mg | ORAL_TABLET | Freq: Every day | ORAL | 1 refills | Status: DC

## 2022-07-02 MED ORDER — CEPHALEXIN 500 MG PO CAPS
500.0000 mg | ORAL_CAPSULE | Freq: Two times a day (BID) | ORAL | 0 refills | Status: DC
Start: 2022-07-02 — End: 2022-07-23

## 2022-07-02 NOTE — Assessment & Plan Note (Signed)
Prob from uti Mri ordered and pt will f/u neuro per er  F/u pcp in 2 weeks

## 2022-07-02 NOTE — Patient Instructions (Signed)
Urinary Tract Infection, Adult A urinary tract infection (UTI) is an infection of any part of the urinary tract. The urinary tract includes: The kidneys. The ureters. The bladder. The urethra. These organs make, store, and get rid of pee (urine) in the body. What are the causes? This infection is caused by germs (bacteria) in your genital area. These germs grow and cause swelling (inflammation) of your urinary tract. What increases the risk? The following factors may make you more likely to develop this condition: Using a small, thin tube (catheter) to drain pee. Not being able to control when you pee or poop (incontinence). Being female. If you are female, these things can increase the risk: Using these methods to prevent pregnancy: A medicine that kills sperm (spermicide). A device that blocks sperm (diaphragm). Having low levels of a female hormone (estrogen). Being pregnant. You are more likely to develop this condition if: You have genes that add to your risk. You are sexually active. You take antibiotic medicines. You have trouble peeing because of: A prostate that is bigger than normal, if you are female. A blockage in the part of your body that drains pee from the bladder. A kidney stone. A nerve condition that affects your bladder. Not getting enough to drink. Not peeing often enough. You have other conditions, such as: Diabetes. A weak disease-fighting system (immune system). Sickle cell disease. Gout. Injury of the spine. What are the signs or symptoms? Symptoms of this condition include: Needing to pee right away. Peeing small amounts often. Pain or burning when peeing. Blood in the pee. Pee that smells bad or not like normal. Trouble peeing. Pee that is cloudy. Fluid coming from the vagina, if you are female. Pain in the belly or lower back. Other symptoms include: Vomiting. Not feeling hungry. Feeling mixed up (confused). This may be the first symptom in  older adults. Being tired and grouchy (irritable). A fever. Watery poop (diarrhea). How is this treated? Taking antibiotic medicine. Taking other medicines. Drinking enough water. In some cases, you may need to see a specialist. Follow these instructions at home:  Medicines Take over-the-counter and prescription medicines only as told by your doctor. If you were prescribed an antibiotic medicine, take it as told by your doctor. Do not stop taking it even if you start to feel better. General instructions Make sure you: Pee until your bladder is empty. Do not hold pee for a long time. Empty your bladder after sex. Wipe from front to back after peeing or pooping if you are a female. Use each tissue one time when you wipe. Drink enough fluid to keep your pee pale yellow. Keep all follow-up visits. Contact a doctor if: You do not get better after 1-2 days. Your symptoms go away and then come back. Get help right away if: You have very bad back pain. You have very bad pain in your lower belly. You have a fever. You have chills. You feeling like you will vomit or you vomit. Summary A urinary tract infection (UTI) is an infection of any part of the urinary tract. This condition is caused by germs in your genital area. There are many risk factors for a UTI. Treatment includes antibiotic medicines. Drink enough fluid to keep your pee pale yellow. This information is not intended to replace advice given to you by your health care provider. Make sure you discuss any questions you have with your health care provider. Document Revised: 09/02/2019 Document Reviewed: 09/07/2019 Elsevier Patient Education    2024 [employer]  

## 2022-07-02 NOTE — Assessment & Plan Note (Signed)
Keflex 500 mg bid x 7 days  Unable to do culture because there was not enough urine  F/u pcp in 2 weeks

## 2022-07-07 NOTE — Progress Notes (Signed)
Cardiology Office Note:    Date:  07/09/2022   ID:  [name], DOB [birth year], MRN [id number]  PCP:  Alexa Plump, MD   [facility] Providers Cardiologist:  Alexa Schultz, MD     Referring MD: Alexa Plump, MD   Chief Complaint: chest pain  History of Present Illness:    [name] is a pleasant 77 y.o. 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  Follow-up echo 03/2019 reveals LVEF 55-60%, G1DD, normal RV.  Had PE felt to be associated with COVID infection in 2021 which she took Eliquis for 6 months.  Nuclear stress test 04/10/2021 revealed no ischemia, low normal EF, low risk study.  Last cardiology clinic visit was 12/24/2021 with Dr. Anne [name].  She reported having chest pain that started a month prior.  Also endorsed shortness of breath with chest pain.  Occurs mostly with walking but sometimes when she is sitting, but such generally last a couple of minutes then resolve on their own.  Epigastric discomfort felt to be related to GERD. No changes were made to treatment regimen and she was advised to return in 6 months for follow-up.  ED admission 06/24/2022 with confusion. Son called our office to report possible stroke. Was advised to take his mom to ED. Per ED notes, she presented with 1 week of worsening peripheral edema, exertional shortness of breath, cough, intermittent chest pain, as well as several months of intermittent nausea and abdominal discomfort and 1 day of hallucinations.  According to patient and family the day prior she was talking to her son all day as if he were in the house when he was at work.  She reported she both saw him and heard him and had conversations with him throughout the day.  No history of prior hallucinations or delirium.  No weakness or speech difficulties.  No evidence of UTI or pneumonia.  CT  head showed no acute abnormalities.  Discussed with neurology who recommended MRI and EEG but pt wanted to go home and they agreed outpatient follow-up was reasonable.  Today, she is here with her son. Reports no further episodes of confusion or hallucinations since episode described above. Saw PCP a few days ago, now has UTI which was not present at the hospital. Has MRI scheduled for 6/7, asks about follow-up with neurology. Has chest pain that occurs most frequently when she is at rest. Says she cannot describe them, "it just hurts." Some shortness of breath at rest and with activity. No additional symptoms of n/v, diaphoresis associated with chest pain or SOB. No orthopnea, PND, edema. Was injured on the job in 2007 and has had pain and disability since that time. Admits shortness of breath has likely occurred since that time. Her son says he does not like for her to walk for exercise because her gait is quite unsteady. No presyncope or syncope. Admits she does not sleep well. Mild OSA on sleep test in 11/2020, was not interested in pursuing CPAP. Only drinks soda and sweet tea, does not like water. Does not seem interested in changing her diet or increasing her activity.   Past Medical History:  Diagnosis Date   Allergy    Anemia    Anxiety    Barrett's esophagus    Bilateral carpal tunnel syndrome    Dr. Renae [name],  having injection therapy   C. difficile diarrhea 08/01/2012   severe 2014   Carotid arterial disease (HCC)    Cataract    removed both eyes    Chronic combined systolic and diastolic CHF (congestive heart failure) (HCC)    Chronic cystitis    Dr. Sherron [name]   Chronic lower back pain    Chronic pain syndrome    Depression    Dizziness    DJD (degenerative joint disease)    bilateral hands; knees   Family history of anesthesia complication    Mother had severe N/V   Fibromyalgia    GERD (gastroesophageal reflux disease)    H/O cardiac catheterization    (-) cath 12-2002  ,  cath again 2011 (-)   History of colon polyps    History of nuclear stress test    Myoview 3/23: EF 51, no ischemia; low risk (echocardiogram in 11/22 with normal EF)   HTN (hypertension)    Hyperlipidemia    past hx    IBS (irritable bowel syndrome)    Insomnia    Migraine    Morbid obesity (HCC)    Non-ischemic cardiomyopathy (HCC)    Osteoporosis    pt unsure of this   RLS (restless legs syndrome)    Vertigo    Vitamin B 12 deficiency 04/09/2013    Past Surgical History:  Procedure Laterality Date   Arm surgery Left    "don't remember what they did; arm wasn't broken"   BILATERAL KNEE ARTHROSCOPY Bilateral    BIOPSY  08/07/2018   Procedure: BIOPSY;  Surgeon: Alexa Dare, MD;  Location: [facility];  Service: Endoscopy;;   CARDIAC CATHETERIZATION  01/22/10   clean cath   CATARACT EXTRACTION W/ INTRAOCULAR LENS  IMPLANT, BILATERAL Bilateral    CHOLECYSTECTOMY N/A 10/25/2016   Procedure: LAPAROSCOPIC CHOLECYSTECTOMY;  Surgeon: Alexa Gelinas, MD;  Location: [facility] OR;  Service: General;  Laterality: N/A;   COLONOSCOPY     COLONOSCOPY W/ POLYPECTOMY     ESOPHAGOGASTRODUODENOSCOPY (EGD) WITH PROPOFOL N/A 08/07/2018   Procedure: ESOPHAGOGASTRODUODENOSCOPY (EGD) WITH PROPOFOL;  Surgeon: Alexa Dare, MD;  Location: [facility];  Service: Endoscopy;  Laterality: N/A;   FOOT SURGERY Bilateral    toenails removed; callus removed on right; hammertoes right"   Knot     "removed from right neck; not a goiter"   LUMBAR DISC SURGERY     L5 S1 anterior fusion   OOPHORECTOMY     RIGHT/LEFT HEART CATH AND CORONARY ANGIOGRAPHY N/A 05/06/2017   Procedure: RIGHT/LEFT HEART CATH AND CORONARY ANGIOGRAPHY;  Surgeon: Alexa Kendall, MD;  Location: [facility];  Service: Cardiovascular;  Laterality: N/A;   SHOULDER ARTHROSCOPY Right    TOTAL KNEE ARTHROPLASTY  10/05/2011   Procedure: TOTAL KNEE ARTHROPLASTY;  Surgeon: Alexa Ochs, MD;  Location: [facility] OR;  Service: Orthopedics;   Laterality: Right;   UPPER GASTROINTESTINAL ENDOSCOPY     VAGINAL HYSTERECTOMY     for endometriosis    Current Medications: Current Meds  Medication Sig   albuterol (VENTOLIN HFA) 108 (90 Base) MCG/ACT inhaler TAKE 2 PUFFS BY MOUTH EVERY 6 HOURS AS NEEDED FOR WHEEZE OR SHORTNESS OF BREATH   cephALEXin (KEFLEX) 500 MG capsule Take 1 capsule (500 mg total) by mouth 2 (two) times daily.   Cholecalciferol (VITAMIN D) 125 MCG (5000 UT) CAPS Take 5,000 Units by mouth daily.   Cyanocobalamin (VITAMIN B12) 1000 MCG TBCR Take 1 tablet by mouth daily.   dicyclomine (BENTYL) 20  MG tablet Take 1 tablet (20 mg total) by mouth 4 (four) times daily -  before meals and at bedtime.   ENTRESTO 97-103 MG TAKE 1 TABLET TWICE A DAY   HYDROcodone-acetaminophen (NORCO) 10-325 MG tablet Take 1 tablet by mouth every 6 (six) hours as needed.   loperamide (IMODIUM) 2 MG capsule Patient takes 2-4 tablets by mouth as needed for diarrhea or loose stools   metoprolol succinate (TOPROL-XL) 50 MG 24 hr tablet TAKE 1 TABLET BY MOUTH DAILY. TAKE WITH OR IMMEDIATELY FOLLOWING A MEAL.   NARCAN 4 MG/0.1ML LIQD nasal spray kit Place 1 spray into the nose once as needed (opioid od).   omeprazole (PRILOSEC) 40 MG capsule Take 1 capsule (40 mg total) by mouth 2 (two) times daily.   ondansetron (ZOFRAN) 4 MG tablet Take 1 tablet (4 mg total) by mouth 2 (two) times daily as needed for nausea or vomiting.   pregabalin (LYRICA) 75 MG capsule Take 75 mg by mouth 3 (three) times daily.   promethazine (PHENERGAN) 25 MG tablet Take 25 mg by mouth every 6 (six) hours as needed for nausea or vomiting.   Propylene Glycol 0.6 % SOLN Apply 2 drops to eye as needed (for dry eyes).   rOPINIRole (REQUIP) 1 MG tablet TAKE 1 TABLET BY MOUTH TWICE A DAY   rosuvastatin (CRESTOR) 20 MG tablet TAKE 1 TABLET BY MOUTH EVERY DAY   sertraline (ZOLOFT) 50 MG tablet Take 1.5 tablets (75 mg total) by mouth daily.   solifenacin (VESICARE) 5 MG tablet Take 1  tablet (5 mg total) by mouth daily.   traZODone (DESYREL) 50 MG tablet Take 3 tablets (150 mg total) by mouth daily as needed for sleep.     Allergies:   Dextromethorphan-guaifenesin, Morphine, Gabapentin, and Penicillins   Social History   Socioeconomic History   Marital status: Divorced    Spouse name: Not on file   Number of children: 2   Years of education: 12   Highest education level: High school graduate  Occupational History   Occupation: Retired, post Sales promotion account executive: RETIRED  Tobacco Use   Smoking status: Former    Packs/day: 0.50    Years: 10.00    Additional pack years: 0.00    Total pack years: 5.00    Types: Cigarettes    Quit date: 02/09/1975    Years since quitting: 47.4   Smokeless tobacco: Never   Tobacco comments:    started at age 28.   quit in the 66s.  Vaping Use   Vaping Use: Never used  Substance and Sexual Activity   Alcohol use: Not Currently   Drug use: No    Comment: CBD and hemp OIL    Sexual activity: Not Currently    Partners: Male  Other Topics Concern   Not on file  Social History Narrative   Son lives with her.   Right-handed.   2 soft drinks per day.   Social Determinants of Health   Financial Resource Strain: Low Risk  (11/09/2021)   Overall Financial Resource Strain (CARDIA)    Difficulty of Paying Living Expenses: Not hard at all  Food Insecurity: No Food Insecurity (11/09/2021)   Hunger Vital Sign    Worried About Running Out of Food in the Last Year: Never true    Ran Out of Food in the Last Year: Never true  Transportation Needs: No Transportation Needs (11/09/2021)   PRAPARE - Administrator, Civil Service (  Medical): No    Lack of Transportation (Non-Medical): No  Physical Activity: Inactive (11/09/2021)   Exercise Vital Sign    Days of Exercise per Week: 0 days    Minutes of Exercise per Session: 0 min  Stress: No Stress Concern Present (11/09/2021)   [employer] of Occupational Health -  Occupational Stress Questionnaire    Feeling of Stress : Not at all  Social Connections: Socially Isolated (11/09/2021)   Social Connection and Isolation Panel [NHANES]    Frequency of Communication with Friends and Family: More than three times a week    Frequency of Social Gatherings with Friends and Family: More than three times a week    Attends Religious Services: Never    Database administrator or Organizations: No    Attends Engineer, structural: Never    Marital Status: Divorced     Family History: The patient's family history includes Breast cancer in her sister; CAD in her father; Colon cancer in her maternal grandfather and maternal uncle; Congestive Heart Failure in her father; Dementia in her father; Diabetes in an other family member; Esophageal cancer in her brother; Hepatitis C in her brother; Lung cancer in her mother; Stroke in her father. There is no history of Rectal cancer, Stomach cancer, or Colon polyps.  ROS:   Please see the history of present illness.   All other systems reviewed and are negative.  Labs/Other Studies Reviewed:    The following studies were reviewed today:  Lexiscan Myoview 04/10/21   Findings are consistent with no prior ischemia. The study is low risk.   No ST deviation was noted.   LV perfusion is normal.   Left ventricular function is abnormal. Global function is mildly reduced. Nuclear stress EF: 51 %. The left ventricular ejection fraction is mildly decreased (45-54%). End diastolic cavity size is normal.   Prior study available for comparison. There are changes compared to prior study. The left ventricular ejection fraction has decreased.   Low risk stress nuclear study with normal perfusion and mildly reduced global left ventricular systolic function.  Echo 12/19/20 1. Left ventricular ejection fraction, by estimation, is 55 to 60%. The  left ventricle has normal function. The left ventricle has no regional  wall motion  abnormalities. Left ventricular diastolic parameters are  consistent with Grade I diastolic  dysfunction (impaired relaxation). There is abnormal septal motion due to  conduction delay.   2. Normal right ventricular free wall strain at 30.9%. . Right  ventricular systolic function is normal. The right ventricular size is  mildly enlarged. There is normal pulmonary artery systolic pressure. The  estimated right ventricular systolic pressure  is 34.4 mmHg.   3. The mitral valve is normal in structure. Trivial mitral valve  regurgitation. No evidence of mitral stenosis.   4. The aortic valve is tricuspid. Aortic valve regurgitation is mild. No  aortic stenosis is present.   5. The inferior vena cava is normal in size with greater than 50%  respiratory variability, suggesting right atrial pressure of 3 mmHg.  R/LHC 05/06/17 Conclusions: No angiographically significant coronary artery disease. Moderately to severely reduced left ventricular contraction (LVEF 30-35%), consistent with non-ischemic cardiomyopathy. Upper normal to mildly elevated left heart, right heart, and pulmonary artery pressures. Low normal to mildly decreased cardiac output/index.   Recommendations: Optimize evidence-based heart failure therapy. Primary prevention of coronary artery disease.  Recent Labs: 06/24/2022: ALT 8; B Natriuretic Peptide 66.6; BUN 9; Creatinine, Ser 0.88; Hemoglobin 12.0; Platelets  160; Potassium 3.8; Sodium 140; TSH 1.952  Recent Lipid Panel    Component Value Date/Time   CHOL 144 09/04/2021 1119   CHOL 233 (H) 02/20/2021 1111   TRIG 132.0 09/04/2021 1119   HDL 55.70 09/04/2021 1119   HDL 49 02/20/2021 1111   CHOLHDL 3 09/04/2021 1119   VLDL 26.4 09/04/2021 1119   LDLCALC 62 09/04/2021 1119   LDLCALC 156 (H) 02/20/2021 1111   LDLDIRECT 166.0 04/14/2010 1539     Risk Assessment/Calculations:           Physical Exam:    VS:  BP 126/84   Pulse 82   Ht 5\' 5"  (1.651 m)   Wt 236  lb (107 kg)   SpO2 96%   BMI 39.27 kg/m     Wt Readings from Last 3 Encounters:  07/09/22 236 lb (107 kg)  07/02/22 238 lb 6.4 oz (108.1 kg)  06/24/22 233 lb (105.7 kg)     GEN: Well nourished, well developed in no acute distress HEENT: Normal NECK: No JVD; No carotid bruits CARDIAC: RRR, no murmurs, rubs, gallops RESPIRATORY:  Clear to auscultation without rales, wheezing or rhonchi  ABDOMEN: Soft, non-tender, non-distended MUSCULOSKELETAL:  No edema; No deformity. 2+ pedal pulses, equal bilaterally SKIN: Warm and dry NEUROLOGIC:  Alert and oriented x 3 PSYCHIATRIC:  Normal affect   EKG:  EKG is not ordered today.         Diagnoses:    1. Chronic heart failure with preserved ejection fraction (HCC)   2. Hyperlipidemia LDL goal <70   3. Nonischemic cardiomyopathy (HCC)   4. Medication management   5. Primary hypertension   6. Nonrheumatic aortic valve insufficiency    Assessment and Plan:     Chronic combined CHF/NICM: LVEF 55 to 60%, G1 DD, mildly enlarged RV with RVSP 34.4 mmHg on echo 12/2020. Chronic shortness of breath that she feels is stable. She is not very active. No orthopnea, PND, or edema. Weight is stable. SCR 0.880, stable electrolytes on lab work completed 06/24/2022. Continue GDMT including Entresto and metoprolol.   Hyperlipidemia: LDL 62 on 09/04/21. We will recheck today. Continue rosuvastatin.  Hypertension: BP is well controlled. She does not monitor at home.   Aortic insufficiency: Mild AI on echo 12/2020. I do not appreciate a significant monitor on exam.  We will continue to monitor clinically for now.  Medication management: Recent episode of hallucinations/confusion. Lengthy discussion about potential medication interactions. Is only prescribed 3 cardiac medications which I reviewed with pt and her son. Encouraged son to review bottles at home to ensure appropriate dosing.     Disposition: 6 months with Dr. Anne [name] or APP   Medication  Adjustments/Labs and Tests Ordered: Current medicines are reviewed at length with the patient today.  Concerns regarding medicines are outlined above.  Orders Placed This Encounter  Procedures   Lipid Profile   No orders of the defined types were placed in this encounter.   Patient Instructions  Medication Instructions:   Your physician recommends that you continue on your current medications as directed. Please refer to the Current Medication list given to you today.   *If you need a refill on your cardiac medications before your next appointment, please call your pharmacy*   Lab Work:  TODAY!!! LIPID  If you have labs (blood work) drawn today and your tests are completely normal, you will receive your results only by: MyChart Message (if you have MyChart) OR A paper copy in the mail  If you have any lab test that is abnormal or we need to change your treatment, we will call you to review the results.   Testing/Procedures:  None ordered.   Follow-Up: At [facility], you and your health needs are our priority.  As part of our continuing mission to provide you with exceptional heart care, we have created designated Provider Care Teams.  These Care Teams include your primary Cardiologist (physician) and Advanced Practice Providers (APPs -  Physician Assistants and Nurse Practitioners) who all work together to provide you with the care you need, when you need it.  We recommend signing up for the patient portal called "MyChart".  Sign up information is provided on this After Visit Summary.  MyChart is used to connect with patients for Virtual Visits (Telemedicine).  Patients are able to view lab/test results, encounter notes, upcoming appointments, etc.  Non-urgent messages can be sent to your provider as well.   To learn more about what you can do with MyChart, go to [email or web address].    Your next appointment:   6 month(s)  Provider:   Donato Schultz, MD        Signed, Levi Aland, NP  07/09/2022 11:06 AM    [facility]

## 2022-07-09 ENCOUNTER — Ambulatory Visit: Payer: Medicare Other | Attending: Nurse Practitioner | Admitting: Nurse Practitioner

## 2022-07-09 ENCOUNTER — Encounter: Payer: Self-pay | Admitting: Nurse Practitioner

## 2022-07-09 VITALS — BP 126/84 | HR 82 | Ht 65.0 in | Wt 236.0 lb

## 2022-07-09 DIAGNOSIS — I5032 Chronic diastolic (congestive) heart failure: Secondary | ICD-10-CM | POA: Diagnosis not present

## 2022-07-09 DIAGNOSIS — I428 Other cardiomyopathies: Secondary | ICD-10-CM | POA: Diagnosis not present

## 2022-07-09 DIAGNOSIS — I351 Nonrheumatic aortic (valve) insufficiency: Secondary | ICD-10-CM | POA: Diagnosis not present

## 2022-07-09 DIAGNOSIS — Z79899 Other long term (current) drug therapy: Secondary | ICD-10-CM | POA: Insufficient documentation

## 2022-07-09 DIAGNOSIS — E785 Hyperlipidemia, unspecified: Secondary | ICD-10-CM | POA: Insufficient documentation

## 2022-07-09 DIAGNOSIS — I10 Essential (primary) hypertension: Secondary | ICD-10-CM | POA: Insufficient documentation

## 2022-07-09 NOTE — Patient Instructions (Signed)
Medication Instructions:   Your physician recommends that you continue on your current medications as directed. Please refer to the Current Medication list given to you today.   *If you need a refill on your cardiac medications before your next appointment, please call your pharmacy*   Lab Work:  TODAY!!! LIPID  If you have labs (blood work) drawn today and your tests are completely normal, you will receive your results only by: MyChart Message (if you have MyChart) OR A paper copy in the mail If you have any lab test that is abnormal or we need to change your treatment, we will call you to review the results.   Testing/Procedures:  None ordered.   Follow-Up: At [facility], you and your health needs are our priority.  As part of our continuing mission to provide you with exceptional heart care, we have created designated Provider Care Teams.  These Care Teams include your primary Cardiologist (physician) and Advanced Practice Providers (APPs -  Physician Assistants and Nurse Practitioners) who all work together to provide you with the care you need, when you need it.  We recommend signing up for the patient portal called "MyChart".  Sign up information is provided on this After Visit Summary.  MyChart is used to connect with patients for Virtual Visits (Telemedicine).  Patients are able to view lab/test results, encounter notes, upcoming appointments, etc.  Non-urgent messages can be sent to your provider as well.   To learn more about what you can do with MyChart, go to [email or web address].    Your next appointment:   6 month(s)  Provider:   Donato Schultz, MD

## 2022-07-10 LAB — LIPID PANEL
Chol/HDL Ratio: 2.8 ratio (ref 0.0–4.4)
Cholesterol, Total: 149 mg/dL (ref 100–199)
HDL: 54 mg/dL (ref >39)
LDL Chol Calc (NIH): 71 mg/dL (ref 0–99)
Triglycerides: 137 mg/dL (ref 0–149)
VLDL Cholesterol Cal: 24 mg/dL (ref 5–40)

## 2022-07-12 ENCOUNTER — Telehealth: Payer: Self-pay | Admitting: Cardiology

## 2022-07-12 ENCOUNTER — Other Ambulatory Visit: Payer: Self-pay | Admitting: *Deleted

## 2022-07-12 MED ORDER — ENTRESTO 97-103 MG PO TABS: 1.0000 | ORAL_TABLET | Freq: Two times a day (BID) | ORAL | 3 refills | Status: DC

## 2022-07-12 NOTE — Telephone Encounter (Signed)
*  STAT* If patient is at the pharmacy, call can be transferred to refill team.   1. Which medications need to be refilled? (please list name of each medication and dose if known)   sacubitril-valsartan (ENTRESTO) 97-103 MG    2. Which pharmacy/location (including street and city if local pharmacy) is medication to be sent to?[facility] - [place], [place] - [address] [facility] AT Portal to Reg[facility] Sites   3. Do they need a 30 day or 90 day supply? 90 day   Previous refill request sent to wrong pharmacy

## 2022-07-12 NOTE — Telephone Encounter (Signed)
Pt's medication was sent to pt's pharmacy as requested. Confirmation received.  °

## 2022-07-13 ENCOUNTER — Other Ambulatory Visit (HOSPITAL_COMMUNITY): Payer: Medicare Other

## 2022-07-15 ENCOUNTER — Emergency Department (HOSPITAL_BASED_OUTPATIENT_CLINIC_OR_DEPARTMENT_OTHER)
Admission: EM | Admit: 2022-07-15 | Discharge: 2022-07-15 | Disposition: A | Discharge status: Home / Self Care | Payer: Medicare Other | Attending: Emergency Medicine | Admitting: Emergency Medicine

## 2022-07-15 ENCOUNTER — Telehealth: Payer: Self-pay | Admitting: Internal Medicine

## 2022-07-15 ENCOUNTER — Encounter (HOSPITAL_BASED_OUTPATIENT_CLINIC_OR_DEPARTMENT_OTHER): Payer: Self-pay | Admitting: Emergency Medicine

## 2022-07-15 DIAGNOSIS — R410 Disorientation, unspecified: Secondary | ICD-10-CM | POA: Diagnosis not present

## 2022-07-15 DIAGNOSIS — I509 Heart failure, unspecified: Secondary | ICD-10-CM | POA: Diagnosis not present

## 2022-07-15 DIAGNOSIS — Z79899 Other long term (current) drug therapy: Secondary | ICD-10-CM | POA: Insufficient documentation

## 2022-07-15 DIAGNOSIS — I110 Hypertensive heart disease with heart failure: Secondary | ICD-10-CM | POA: Diagnosis not present

## 2022-07-15 LAB — CBC WITH DIFFERENTIAL/PLATELET
Abs Immature Granulocytes: 0.02 10*3/uL (ref 0.00–0.07)
Basophils Absolute: 0 10*3/uL (ref 0.0–0.1)
Basophils Relative: 0 %
Eosinophils Absolute: 0.1 10*3/uL (ref 0.0–0.5)
Eosinophils Relative: 2 %
HCT: 36.9 % (ref 36.0–46.0)
Hemoglobin: 12.1 g/dL (ref 12.0–15.0)
Immature Granulocytes: 0 %
Lymphocytes Relative: 21 %
Lymphs Abs: 1.4 10*3/uL (ref 0.7–4.0)
MCH: 29.7 pg (ref 26.0–34.0)
MCHC: 32.8 g/dL (ref 30.0–36.0)
MCV: 90.4 fL (ref 80.0–100.0)
Monocytes Absolute: 0.3 10*3/uL (ref 0.1–1.0)
Monocytes Relative: 5 %
Neutro Abs: 4.9 10*3/uL (ref 1.7–7.7)
Neutrophils Relative %: 72 %
Platelets: 170 10*3/uL (ref 150–400)
RBC: 4.08 MIL/uL (ref 3.87–5.11)
RDW: 13.2 % (ref 11.5–15.5)
WBC: 6.8 10*3/uL (ref 4.0–10.5)
nRBC: 0 % (ref 0.0–0.2)

## 2022-07-15 LAB — URINALYSIS, W/ REFLEX TO CULTURE (INFECTION SUSPECTED)
Bilirubin Urine: NEGATIVE
Glucose, UA: NEGATIVE mg/dL
Hgb urine dipstick: NEGATIVE
Ketones, ur: NEGATIVE mg/dL
Leukocytes,Ua: NEGATIVE
Nitrite: NEGATIVE
Protein, ur: NEGATIVE mg/dL
RBC / HPF: NONE SEEN RBC/hpf (ref 0–5)
Specific Gravity, Urine: >=1.03 (ref 1.005–1.030)
pH: 5.5 (ref 5.0–8.0)

## 2022-07-15 LAB — COMPREHENSIVE METABOLIC PANEL
ALT: 7 U/L (ref 0–44)
AST: 12 U/L — ABNORMAL LOW (ref 15–41)
Albumin: 3.9 g/dL (ref 3.5–5.0)
Alkaline Phosphatase: 61 U/L (ref 38–126)
Anion gap: 4 — ABNORMAL LOW (ref 5–15)
BUN: 17 mg/dL (ref 8–23)
CO2: 27 mmol/L (ref 22–32)
Calcium: 8.8 mg/dL — ABNORMAL LOW (ref 8.9–10.3)
Chloride: 107 mmol/L (ref 98–111)
Creatinine, Ser: 0.86 mg/dL (ref 0.44–1.00)
GFR, Estimated: >60 mL/min (ref >60)
Glucose, Bld: 122 mg/dL — ABNORMAL HIGH (ref 70–99)
Potassium: 4.1 mmol/L (ref 3.5–5.1)
Sodium: 138 mmol/L (ref 135–145)
Total Bilirubin: 0.4 mg/dL (ref 0.3–1.2)
Total Protein: 6.5 g/dL (ref 6.5–8.1)

## 2022-07-15 NOTE — ED Provider Notes (Signed)
[place] [facility] Provider Note   CSN: [id number] Arrival date & time: 07/15/22  4098     History  Chief Complaint  Patient presents with   Altered Mental Status    [name] is a 77 y.o. female.  Patient is a 77 year old female with a history of hypertension, Barrett's esophagus, migraines, chronic low back pain, GERD, chronic cystitis, nonischemic cardiomyopathy and CHF who is presenting today with her son due to an episode of confusion.  Her son reports that around 9:00 this morning she had gotten up and she was closing all the windows and thought it was nighttime and that it was time for him to be home from work and go to bed.  She reports that she woke up at 4 AM this morning because she has been having difficulty sleeping and then went back and took a nap and thought she may have just been disoriented.  Her son reports she seemed a little bit dazed but now seems her normal self.  She did see her doctor last week and was told she had a UTI and was given an antibiotic which she is still taking as well as recently starting on a medication to decrease the amount of times she urinates but instead it is making her pee more.  She however has not had fever, cough, congestion, nausea or vomiting.  No unilateral weakness or numbness.  No speech changes.  Her son reports she was seen about a month ago for a similar episode and they did follow-up as outpatients and she has an MRI scheduled tomorrow.  The history is provided by the patient.  Altered Mental Status      Home Medications Prior to Admission medications   Medication Sig Start Date End Date Taking? Authorizing Provider  albuterol (VENTOLIN HFA) 108 (90 Base) MCG/ACT inhaler TAKE 2 PUFFS BY MOUTH EVERY 6 HOURS AS NEEDED FOR WHEEZE OR SHORTNESS OF BREATH 04/19/22   Wanda Plump, MD  cephALEXin (KEFLEX) 500 MG capsule Take 1 capsule (500 mg total) by mouth 2 (two) times daily. 07/02/22   Donato Schultz, DO  Cholecalciferol (VITAMIN D) 125 MCG (5000 UT) CAPS Take 5,000 Units by mouth daily.    [provider]  Cyanocobalamin (VITAMIN B12) 1000 MCG TBCR Take 1 tablet by mouth daily.    [provider]  dicyclomine (BENTYL) 20 MG tablet Take 1 tablet (20 mg total) by mouth 4 (four) times daily -  before meals and at bedtime. 01/13/22   Meryl Dare, MD  HYDROcodone-acetaminophen (NORCO) 10-325 MG tablet Take 1 tablet by mouth every 6 (six) hours as needed. 08/15/20   [provider]  loperamide (IMODIUM) 2 MG capsule Patient takes 2-4 tablets by mouth as needed for diarrhea or loose stools    [provider]  metoprolol succinate (TOPROL-XL) 50 MG 24 hr tablet TAKE 1 TABLET BY MOUTH DAILY. TAKE WITH OR IMMEDIATELY FOLLOWING A MEAL. 04/05/22   Jake Bathe, MD  NARCAN 4 MG/0.1ML LIQD nasal spray kit Place 1 spray into the nose once as needed (opioid od). 04/24/18   [provider]  nitroGLYCERIN (NITROSTAT) 0.4 MG SL tablet Place 1 tablet (0.4 mg total) under the tongue every 5 (five) minutes as needed for chest pain. Patient not taking: Reported on 03/12/2022 02/20/21 12/24/21  Tereso Newcomer T, PA-C  omeprazole (PRILOSEC) 40 MG capsule Take 1 capsule (40 mg total) by mouth 2 (two) times  daily. 01/13/22   Meryl Dare, MD  ondansetron (ZOFRAN) 4 MG tablet Take 1 tablet (4 mg total) by mouth 2 (two) times daily as needed for nausea or vomiting. 06/03/22   Wanda Plump, MD  pregabalin (LYRICA) 75 MG capsule Take 75 mg by mouth 3 (three) times daily. 08/15/20   [provider]  promethazine (PHENERGAN) 25 MG tablet Take 25 mg by mouth every 6 (six) hours as needed for nausea or vomiting.    [provider]  Propylene Glycol 0.6 % SOLN Apply 2 drops to eye as needed (for dry eyes).    [provider]  rOPINIRole (REQUIP) 1 MG tablet TAKE 1 TABLET BY MOUTH TWICE A DAY 04/19/22   Wanda Plump, MD  rosuvastatin (CRESTOR) 20 MG  tablet TAKE 1 TABLET BY MOUTH EVERY DAY 01/26/22   Jake Bathe, MD  sacubitril-valsartan (ENTRESTO) 97-103 MG Take 1 tablet by mouth 2 (two) times daily. 07/12/22 07/12/23  Jake Bathe, MD  sertraline (ZOLOFT) 50 MG tablet Take 1.5 tablets (75 mg total) by mouth daily. 01/15/22   Wanda Plump, MD  solifenacin (VESICARE) 5 MG tablet Take 1 tablet (5 mg total) by mouth daily. 07/02/22   Donato Schultz, DO  traZODone (DESYREL) 50 MG tablet Take 3 tablets (150 mg total) by mouth daily as needed for sleep. 03/12/22   Wanda Plump, MD      Allergies    Dextromethorphan-guaifenesin, Morphine, Gabapentin, and Penicillins    Review of Systems   Review of Systems  Physical Exam Updated Vital Signs BP (!) 146/50 (BP Location: Right Arm)   Pulse 72   Temp 98.5 F (36.9 C) (Oral)   Resp 18   Ht 5\' 5"  (1.651 m)   Wt 107.5 kg   SpO2 92%   BMI 39.44 kg/m  Physical Exam Vitals and nursing note reviewed.  Constitutional:      General: She is not in acute distress.    Appearance: She is well-developed.  HENT:     Head: Normocephalic and atraumatic.  Eyes:     General: No visual field deficit.    Pupils: Pupils are equal, round, and reactive to light.  Cardiovascular:     Rate and Rhythm: Normal rate and regular rhythm.     Heart sounds: Normal heart sounds. No murmur heard.    No friction rub.  Pulmonary:     Effort: Pulmonary effort is normal.     Breath sounds: Normal breath sounds. No wheezing or rales.  Abdominal:     General: Bowel sounds are normal. There is no distension.     Palpations: Abdomen is soft.     Tenderness: There is no abdominal tenderness. There is no guarding or rebound.  Musculoskeletal:        General: No tenderness. Normal range of motion.     Comments: No edema  Skin:    General: Skin is warm and dry.     Findings: No rash.  Neurological:     Mental Status: She is alert and oriented to person, place, and time. Mental status is at baseline.     Cranial  Nerves: No cranial nerve deficit, dysarthria or facial asymmetry.     Sensory: No sensory deficit.     Motor: No weakness or pronator drift.     Coordination: Coordination normal. Finger-Nose-Finger Test and Heel to [facility] Test normal.     Gait: Gait normal.     Comments: Patient  was able to stand and walk to the bathroom independently without help and no evidence of ataxia  Psychiatric:        Behavior: Behavior normal.     ED Results / Procedures / Treatments   Labs (all labs ordered are listed, but only abnormal results are displayed) Labs Reviewed  COMPREHENSIVE METABOLIC PANEL - Abnormal; Notable for the following components:      Result Value   Glucose, Bld 122 (*)    Calcium 8.8 (*)    AST 12 (*)    Anion gap 4 (*)    All other components within normal limits  URINALYSIS, W/ REFLEX TO CULTURE (INFECTION SUSPECTED) - Abnormal; Notable for the following components:   APPearance HAZY (*)    Bacteria, UA RARE (*)    All other components within normal limits  CBC WITH DIFFERENTIAL/PLATELET    EKG None  Radiology No results found.  Procedures Procedures    Medications Ordered in ED Medications - No data to display  ED Course/ Medical Decision Making/ A&P                             Medical Decision Making Amount and/or Complexity of Data Reviewed Labs: ordered. Decision-making details documented in ED Course.   Pt with multiple medical problems and comorbidities and presenting today with a complaint that caries a high risk for morbidity and mortality.  Here today with an episode of confusion that is now resolved.  Possibility for polypharmacy versus addition of an antibiotic versus poor sleep.  Low suspicion for TIA, seizure.  Patient's exam is normal here.  I independently interpreted patient's labs and CBC, BMP, UA all within normal limits.  Patient had a head CT done last month for similar event there is no evidence of brain tumor.  Patient had ammonia, TSH done  at that time as well which was normal.  Low suspicion for hepatic encephalopathy as patient does not have liver disease is not a drinker and would be low suspicion.  Findings discussed with the patient and her son.  At this time feel that patient is stable for discharge and follow-up tomorrow as planned.  Did discuss with her discontinuing the antibiotic.  She is going to speak with her doctor about a sleep aid that might be more helpful as she reports no improvement with trazodone.  Also did discuss with her and her son return precautions if this confusion returns and is not going away instructed her to go to [facility] because she would need MRI and EEG.  She and her son are comfortable with this plan.         Final Clinical Impression(s) / ED Diagnoses Final diagnoses:  Confusion    Rx / DC Orders ED Discharge Orders     None         Gwyneth Sprout, MD 07/15/22 1121

## 2022-07-15 NOTE — Discharge Instructions (Signed)
Follow-up tomorrow for the MRI as planned.  However if the symptoms return and they do not go away it would be important for you to go to [facility] so that you can get an MRI and an EEG which has been planned for an outpatient.  Your lab work today looks normal.  You do not have a UTI and you can stop taking the antibiotic.

## 2022-07-15 NOTE — Telephone Encounter (Signed)
Pt's son ca;;ed and stated he is very concerned about his mom. She has not been able to remember things properly this morning, thinking it is night time and overall confusion. He denied any slurred speech and wanted her to be seen. Transferred to triage as he stated this has never happened before and was a sudden change.

## 2022-07-15 NOTE — ED Triage Notes (Signed)
Husband reports pt was "speaking nonsense" this morning at 815 AM. States episode lasted ~ 1 hour. Husband reports pt is back to baseline. Denies other sx. Pt a&o x 4, and answering questions appropriately during triage. Seen last month for same.

## 2022-07-15 NOTE — Telephone Encounter (Signed)
Pt at ED.

## 2022-07-15 NOTE — Telephone Encounter (Signed)
Initial Comment Caller states his mother is confused this morning. Translation No Nurse Assessment Nurse: Gasper Sells, RN, Marylu Lund Date/Time [name] Time): 07/15/2022 8:58:18 AM Confirm and document reason for call. If symptomatic, describe symptoms. ---Caller states his mother is confused this morning. Last month was confused too. Went to ER last time. ? small stroke. MRI scheduled for tomorrow. Thinks it nighttime. Does the patient have any new or worsening symptoms? ---Yes Will a triage be completed? ---Yes Related visit to physician within the last 2 weeks? ---No Does the PT have any chronic conditions? (i.e. diabetes, asthma, this includes High risk factors for pregnancy, etc.) ---Yes List chronic conditions. ---HTN, GERD, Is this a behavioral health or substance abuse call? ---No Guidelines Guideline Title Affirmed Question Affirmed Notes Nurse Date/Time (Eastern Time) Neurologic Deficit Difficult to awaken or acting confused (e.g., disoriented, slurred speech) Gasper Sells, RN, Marylu Lund 07/15/2022 9:01:14 AM Disp. Time [name] Time) Disposition Final User 07/15/2022 8:57:16 AM Send to Urgent [place], [place] PLEASE NOTE: All timestamps contained within this report are represented as [place] Standard Time. CONFIDENTIALTY NOTICE: This fax transmission is intended only for the addressee. It contains information that is legally privileged, confidential or otherwise protected from use or disclosure. If you are not the intended recipient, you are strictly prohibited from reviewing, disclosing, copying using or disseminating any of this information or taking any action in reliance on or regarding this information. If you have received this fax in error, please notify us immediately by telephone so that we can arrange for its return to [place]. Phone: [phone], Toll-Free: [phone], Fax: [phone] Page: 2 of 2 Call Id: [id number] Disp. Time [name] Time) Disposition Final  User 07/15/2022 9:02:07 AM Call EMS 911 Now Yes Quentin Cornwall 07/15/2022 9:04:08 AM 911 Outcome Documentation Gasper Sells, RN, Marylu Lund Reason: Taking to closest ER, not calling 911 as instructed Final Disposition 07/15/2022 9:02:07 AM Call EMS 911 Now Yes Gasper Sells, RN, Herbert Pun Disagree/Comply Comply Caller Understands Yes PreDisposition Call Doctor Care Advice Given Per Guideline CALL EMS 911 NOW: * Immediate medical attention is needed. You need to hang up and call 911 (or an ambulance). CARE ADVICE given per Neurologic Deficit (Adult) guideline. Referrals [facility] - ED

## 2022-07-16 ENCOUNTER — Ambulatory Visit (HOSPITAL_COMMUNITY)
Admission: RE | Admit: 2022-07-16 | Discharge: 2022-07-16 | Disposition: A | Discharge status: Home / Self Care | Payer: Medicare Other | Source: Ambulatory Visit | Attending: Family Medicine | Admitting: Family Medicine

## 2022-07-16 DIAGNOSIS — R443 Hallucinations, unspecified: Secondary | ICD-10-CM | POA: Diagnosis not present

## 2022-07-16 DIAGNOSIS — G319 Degenerative disease of nervous system, unspecified: Secondary | ICD-10-CM | POA: Diagnosis not present

## 2022-07-16 DIAGNOSIS — R4182 Altered mental status, unspecified: Secondary | ICD-10-CM | POA: Diagnosis not present

## 2022-07-23 ENCOUNTER — Encounter: Payer: Self-pay | Admitting: Internal Medicine

## 2022-07-23 ENCOUNTER — Ambulatory Visit (INDEPENDENT_AMBULATORY_CARE_PROVIDER_SITE_OTHER): Payer: Medicare Other | Admitting: Internal Medicine

## 2022-07-23 VITALS — BP 122/76 | HR 85 | Temp 98.1°F | Resp 18 | Ht 65.0 in | Wt 235.2 lb

## 2022-07-23 DIAGNOSIS — G4700 Insomnia, unspecified: Secondary | ICD-10-CM | POA: Diagnosis not present

## 2022-07-23 DIAGNOSIS — I428 Other cardiomyopathies: Secondary | ICD-10-CM

## 2022-07-23 DIAGNOSIS — R9089 Other abnormal findings on diagnostic imaging of central nervous system: Secondary | ICD-10-CM | POA: Diagnosis not present

## 2022-07-23 DIAGNOSIS — F419 Anxiety disorder, unspecified: Secondary | ICD-10-CM

## 2022-07-23 DIAGNOSIS — E782 Mixed hyperlipidemia: Secondary | ICD-10-CM

## 2022-07-23 DIAGNOSIS — F32A Depression, unspecified: Secondary | ICD-10-CM | POA: Diagnosis not present

## 2022-07-23 MED ORDER — ROSUVASTATIN CALCIUM 40 MG PO TABS: 40.0000 mg | ORAL_TABLET | Freq: Every day | ORAL | 1 refills | Status: DC

## 2022-07-23 NOTE — Progress Notes (Unsigned)
Subjective:    Patient ID: [name], female    DOB: 21-Jan-1946, 77 y.o.   MRN: [id number]  DOS:  07/23/2022 Type of visit - description: f/u  Chart reviewed:  ED 06/24/2022 Seen w/  multiple symptoms including edema, SOB, cough, chest pain, nausea but the main concern was hallucinations. Blood work was reassuring chest x-ray no acute, CT head without contrast no acute.  CT abdomen and pelvis with contrast: No specific cause for abdominal pain and vomiting.   07/15/2022: Seen in the emergency room with mental status changes.  Glucose 122, LFTs normal,   creatinine normal, CBC normal, UA with no infection. They felt polypharmacy was probably part of the problem.  Also has poor sleep.  Since she left the ER, reports no further confusion. Is trying to use less trazodone. Denies any fever or chills No dysuria or gross hematuria No difficulty urinating.  Her son is here and he reports that the memory is not doing well.     Review of Systems See above   Past Medical History:  Diagnosis Date   Allergy    Anemia    Anxiety    Barrett's esophagus    Bilateral carpal tunnel syndrome    Dr. Renae Fickle, having injection therapy   C. difficile diarrhea 08/01/2012   severe 2014   Carotid arterial disease (HCC)    Cataract    removed both eyes    Chronic combined systolic and diastolic CHF (congestive heart failure) (HCC)    Chronic cystitis    Dr. Sherron Monday   Chronic lower back pain    Chronic pain syndrome    Depression    Dizziness    DJD (degenerative joint disease)    bilateral hands; knees   Family history of anesthesia complication    Mother had severe N/V   Fibromyalgia    GERD (gastroesophageal reflux disease)    H/O cardiac catheterization    (-) cath 12-2002  , cath again 2011 (-)   History of colon polyps    History of nuclear stress test    Myoview 3/23: EF 51, no ischemia; low risk (echocardiogram in 11/22 with normal EF)   HTN (hypertension)     Hyperlipidemia    past hx    IBS (irritable bowel syndrome)    Insomnia    Migraine    Morbid obesity (HCC)    Non-ischemic cardiomyopathy (HCC)    Osteoporosis    pt unsure of this   RLS (restless legs syndrome)    Vertigo    Vitamin B 12 deficiency 04/09/2013    Past Surgical History:  Procedure Laterality Date   Arm surgery Left    "don't remember what they did; arm wasn't broken"   BILATERAL KNEE ARTHROSCOPY Bilateral    BIOPSY  08/07/2018   Procedure: BIOPSY;  Surgeon: Meryl Dare, MD;  Location: [facility];  Service: Endoscopy;;   CARDIAC CATHETERIZATION  01/22/10   clean cath   CATARACT EXTRACTION W/ INTRAOCULAR LENS  IMPLANT, BILATERAL Bilateral    CHOLECYSTECTOMY N/A 10/25/2016   Procedure: LAPAROSCOPIC CHOLECYSTECTOMY;  Surgeon: Violeta Gelinas, MD;  Location: [facility] OR;  Service: General;  Laterality: N/A;   COLONOSCOPY     COLONOSCOPY W/ POLYPECTOMY     ESOPHAGOGASTRODUODENOSCOPY (EGD) WITH PROPOFOL N/A 08/07/2018   Procedure: ESOPHAGOGASTRODUODENOSCOPY (EGD) WITH PROPOFOL;  Surgeon: Meryl Dare, MD;  Location: [facility] ENDOSCOPY;  Service: Endoscopy;  Laterality: N/A;   FOOT SURGERY Bilateral    toenails removed; callus removed  on right; hammertoes right"   Knot     "removed from right neck; not a goiter"   LUMBAR DISC SURGERY     L5 S1 anterior fusion   OOPHORECTOMY     RIGHT/LEFT HEART CATH AND CORONARY ANGIOGRAPHY N/A 05/06/2017   Procedure: RIGHT/LEFT HEART CATH AND CORONARY ANGIOGRAPHY;  Surgeon: Yvonne Kendall, MD;  Location: [facility] INVASIVE CV LAB;  Service: Cardiovascular;  Laterality: N/A;   SHOULDER ARTHROSCOPY Right    TOTAL KNEE ARTHROPLASTY  10/05/2011   Procedure: TOTAL KNEE ARTHROPLASTY;  Surgeon: Velna Ochs, MD;  Location: [facility] OR;  Service: Orthopedics;  Laterality: Right;   UPPER GASTROINTESTINAL ENDOSCOPY     VAGINAL HYSTERECTOMY     for endometriosis    Current Outpatient Medications  Medication Instructions   albuterol (VENTOLIN HFA)  108 (90 Base) MCG/ACT inhaler TAKE 2 PUFFS BY MOUTH EVERY 6 HOURS AS NEEDED FOR WHEEZE OR SHORTNESS OF BREATH   cephALEXin (KEFLEX) 500 mg, Oral, 2 times daily   Cyanocobalamin (VITAMIN B12) 1000 MCG TBCR 1 tablet, Oral, Daily   dicyclomine (BENTYL) 20 mg, Oral, 3 times daily before meals & bedtime   HYDROcodone-acetaminophen (NORCO) 10-325 MG tablet 1 tablet, Oral, Every 6 hours PRN   loperamide (IMODIUM) 2 MG capsule Patient takes 2-4 tablets by mouth as needed for diarrhea or loose stools   metoprolol succinate (TOPROL-XL) 50 mg, Oral, Daily, TAKE WITH OR IMMEDIATELY FOLLOWING A MEAL.   NARCAN 4 MG/0.1ML LIQD nasal spray kit 1 spray, Once PRN   nitroGLYCERIN (NITROSTAT) 0.4 mg, Sublingual, Every 5 min PRN   omeprazole (PRILOSEC) 40 mg, Oral, 2 times daily   ondansetron (ZOFRAN) 4 mg, Oral, 2 times daily PRN   pregabalin (LYRICA) 75 mg, Oral, 3 times daily   promethazine (PHENERGAN) 25 mg, Oral, Every 6 hours PRN   Propylene Glycol 0.6 % SOLN 2 drops, Ophthalmic, As needed   rOPINIRole (REQUIP) 1 mg, Oral, 2 times daily   rosuvastatin (CRESTOR) 20 mg, Oral, Daily   sacubitril-valsartan (ENTRESTO) 97-103 MG 1 tablet, Oral, 2 times daily   sertraline (ZOLOFT) 75 mg, Oral, Daily   solifenacin (VESICARE) 5 mg, Oral, Daily   traZODone (DESYREL) 150 mg, Oral, Daily PRN   Vitamin D 5,000 Units, Oral, Daily       Objective:   Physical Exam BP 122/76   Pulse 85   Temp 98.1 F (36.7 C) (Oral)   Resp 18   Ht 5\' 5"  (1.651 m)   Wt 235 lb 4 oz (106.7 kg)   SpO2 95%   BMI 39.15 kg/m  General:   Well developed, NAD, BMI noted. HEENT:  Normocephalic . Face symmetric, atraumatic   Neurologic:  alert & oriented X3.  Speech normal, gait appropriate for age and unassisted Psych--  Cognition and judgment appear intact.  Cooperative with normal attention span and concentration.  Behavior appropriate. No anxious or depressed appearing.      Assessment     Assessment    HTN Hyperlipidemia-  Pravachol, : Myalgias. Intolerant to Zetia (joint aches), see OV  06/2016  Depression, Anxiety (on clonazepam), insomnia (on trazodone):  depression x many  years, remotely on zoloft and other meds per psych, I rx lexapro 2015, then was rx effexor x a while Morbid obesity CV: CHF/ non ischemic cardiomyopathy dx 04-2017; cath normal coronaries, EF ~ 30% Carotid artery disease: 1 to 39% bilateral carotids 09-2016, start aspirin per neurology GI:  --GERD, Barrett's esophagus, had a EGD 07/2018  --h/o persistent C.  difficile diarrhea 2014 --Cholecystectomy B12 deficiency Osteopenia: Dexa 2015 showed osteopenia, T score -1.9 (10/2016): tscore 2021 - 1.8; Rx cs, vit D PULM: --NPSG 2008:  No osa, PLMS 4/hr with arousal and awakening --Home sleep study 11-2020 mild sleep apnea, further advised per pulmonary. --RLS - Requip  Migraines, f/u [facility] -- off topamax as off 06-2016 MSK: --Back pain, chronic: pain meds rx by ortho, Workers comp MD @ [employer] pain mngmt [place] --DJD,CTS B ([facility]) --Fibromyalgia.  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  anticoag until 08-2020.  PLAN  CHF/NICM.  Aortic insufficiency. Saw cardiology 07/09/2022 no changes made, cholesterol checked and satisfactory.  Depression anxiety insomnia: 2020 was on clonazepam and trazodone, symptoms not well-controlled, restarted sertraline which historically helped the most. On  03-2022, trazodone dose increased due to insomnia. At this point, I have not been able to effectively control her insomnia, referred to neurology for insomnia management.  Also disown expect some concerns about her memory. Hallucinations: Seen at the emergency room, I agree with the ER physician that polypharmacy is probably playing a role.  She takes sertraline, trazodone, Requip (for RLS). She has been  referred to neurology. Abnormal brain MRI: The patient is concerned about the abnormal brain MRI done on 07/16/2022.    1.  No evidence of an acute intracranial abnormality. 2. Mild chronic small vessel ischemic changes within the cerebral white matter, progressed from the prior brain MRI of 09/08/2017. 3. 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 4. Mild generalized cerebral atrophy. Results were extensively discussed with the patient, in summary is consistent with a small vessel ischemia, they did notice new tiny infarct compared to previous MRI. With this in mind we will continue with aspirin and increase rosuvastatin to 40 mg daily. High cholesterol: See above, has a new tiny cerebellar infarct, increase statin dose.   RTC 3 months  Time spent 30 minutes, extensive chart review, extensive discussion about MRI results.  2-2 GERD, IBS-D Saw GI 01/13/2022, continue omeprazole BID, dicyclomine, Imodium.  Discontinue surveillance EGDs or colonoscopies due to age. Current GI symptoms likely IBS related.  Will check a CBC, if anemia noted will need further eval. Encouraged to use all the medications she has for as needed use such as dicyclomine, Zofran, Phenergan.  Also take an additional PPI if needed. Red flag symptoms discussed, see AVS: CHF Saw cardiology 12/24/2021, felt to be stable.   Next visit 1 year Today she does not seem volume overloaded however she is tachycardic.  I liked to rule out A-fib, EKG done today: It confirms sinus tachycardia, no A-fib.  RBBB: Not new.  Do not see any major changes. She still reports episodic chest pain, She reported same to cardiology at the last visit, they noted previous ischemic workup to be negative. Plan: Continue present care, check CMP TSH Depression anxiety insomnia: PHQ-9: 12 Continue sertraline states trazodone is not helping much.  Recommend to increase dose to 3 tablets nightly as needed  (potential interaction with Zoloft noted, Zoloft is a low-dose). Hopefully trazodone will help with depression and insomnia. Back pain, chronic pain: Has not been able to get hydrocodone, not taking it for a while. RTC 6 months  Time spent 35 m

## 2022-07-23 NOTE — Patient Instructions (Addendum)
Try to take the least amount of trazodone to help with sleep.  Increase rosuvastatin to 40 mg every day.  Do arrange the office visit with a neurologist for further evaluation. Please discuss your difficulty sleeping, multiple medications and memory issues with them. You can call neurology at [phone]  The vaccines I recommend: Shingrix (shingles) Tdap (tetanus) RSV vaccine  Please bring [place] a copy of your Healthcare Power of Attorney for your chart.   Check the  blood pressure regularly BP GOAL is between 110/65 and  135/85. If it is consistently higher or lower, let me know     GO TO THE FRONT DESK, PLEASE SCHEDULE YOUR APPOINTMENTS Come back for checkup in 3 months

## 2022-07-24 NOTE — Assessment & Plan Note (Signed)
CHF/NICM.  Aortic insufficiency. Saw cardiology 07/09/2022 no changes made, cholesterol checked and satisfactory. Depression anxiety insomnia: 2020 was on clonazepam and trazodone, symptoms not well-controlled, restarted sertraline which historically helped the most. On  03-2022, trazodone dose increased due to insomnia. At this point, I have not been able to effectively control her insomnia, referred to neurology for insomnia management.  Also pt's son has concerns about her memory. Hallucinations: Seen at the emergency room, I agree with the ER physician that polypharmacy is probably playing a role.  She takes sertraline, trazodone, Requip (for RLS). She has been referred to neurology. Abnormal brain MRI: The patient is concerned about the abnormal brain MRI done on 07/16/2022.   1.  No evidence of an acute intracranial abnormality. 2. Mild chronic small vessel ischemic changes within the cerebral white matter, progressed from the prior brain MRI of 09/08/2017. 3. 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 4. Mild generalized cerebral atrophy. Results were extensively discussed with the patient, in summary is consistent with a small vessel ischemia, they did notice new tiny infarct compared to previous MRI. With this in mind we will continue with aspirin and increase rosuvastatin to 40 mg daily. High cholesterol: See above,  increase statin dose. RTC 3 months

## 2022-08-30 ENCOUNTER — Other Ambulatory Visit: Payer: Self-pay | Admitting: Internal Medicine

## 2022-09-10 ENCOUNTER — Ambulatory Visit: Payer: Medicare Other | Admitting: Internal Medicine

## 2022-09-10 DIAGNOSIS — Z96611 Presence of right artificial shoulder joint: Secondary | ICD-10-CM | POA: Diagnosis not present

## 2022-09-10 DIAGNOSIS — R112 Nausea with vomiting, unspecified: Secondary | ICD-10-CM | POA: Diagnosis not present

## 2022-09-10 DIAGNOSIS — K2270 Barrett's esophagus without dysplasia: Secondary | ICD-10-CM | POA: Diagnosis not present

## 2022-09-10 DIAGNOSIS — K219 Gastro-esophageal reflux disease without esophagitis: Secondary | ICD-10-CM | POA: Diagnosis not present

## 2022-09-10 DIAGNOSIS — I110 Hypertensive heart disease with heart failure: Secondary | ICD-10-CM | POA: Diagnosis not present

## 2022-09-10 DIAGNOSIS — R1013 Epigastric pain: Secondary | ICD-10-CM | POA: Diagnosis not present

## 2022-09-10 DIAGNOSIS — Z8673 Personal history of transient ischemic attack (TIA), and cerebral infarction without residual deficits: Secondary | ICD-10-CM | POA: Diagnosis not present

## 2022-09-10 DIAGNOSIS — K2100 Gastro-esophageal reflux disease with esophagitis, without bleeding: Secondary | ICD-10-CM | POA: Diagnosis not present

## 2022-09-10 DIAGNOSIS — Z7901 Long term (current) use of anticoagulants: Secondary | ICD-10-CM | POA: Diagnosis not present

## 2022-09-10 DIAGNOSIS — I509 Heart failure, unspecified: Secondary | ICD-10-CM | POA: Diagnosis not present

## 2022-09-10 DIAGNOSIS — K2950 Unspecified chronic gastritis without bleeding: Secondary | ICD-10-CM | POA: Diagnosis not present

## 2022-09-10 DIAGNOSIS — Z6839 Body mass index (BMI) 39.0-39.9, adult: Secondary | ICD-10-CM | POA: Diagnosis not present

## 2022-09-10 DIAGNOSIS — Z79899 Other long term (current) drug therapy: Secondary | ICD-10-CM | POA: Diagnosis not present

## 2022-09-10 DIAGNOSIS — Z880 Allergy status to penicillin: Secondary | ICD-10-CM | POA: Diagnosis not present

## 2022-09-10 DIAGNOSIS — Z885 Allergy status to narcotic agent status: Secondary | ICD-10-CM | POA: Diagnosis not present

## 2022-09-10 DIAGNOSIS — Z888 Allergy status to other drugs, medicaments and biological substances status: Secondary | ICD-10-CM | POA: Diagnosis not present

## 2022-09-10 DIAGNOSIS — E669 Obesity, unspecified: Secondary | ICD-10-CM | POA: Diagnosis not present

## 2022-09-10 DIAGNOSIS — M1990 Unspecified osteoarthritis, unspecified site: Secondary | ICD-10-CM | POA: Diagnosis not present

## 2022-09-10 DIAGNOSIS — Z96651 Presence of right artificial knee joint: Secondary | ICD-10-CM | POA: Diagnosis not present

## 2022-09-10 DIAGNOSIS — Z87891 Personal history of nicotine dependence: Secondary | ICD-10-CM | POA: Diagnosis not present

## 2022-09-14 DIAGNOSIS — K209 Esophagitis, unspecified without bleeding: Secondary | ICD-10-CM | POA: Diagnosis not present

## 2022-09-14 DIAGNOSIS — K2950 Unspecified chronic gastritis without bleeding: Secondary | ICD-10-CM | POA: Diagnosis not present

## 2022-09-14 DIAGNOSIS — K2270 Barrett's esophagus without dysplasia: Secondary | ICD-10-CM | POA: Diagnosis not present

## 2022-09-23 ENCOUNTER — Encounter (INDEPENDENT_AMBULATORY_CARE_PROVIDER_SITE_OTHER): Payer: Self-pay

## 2022-09-24 DIAGNOSIS — H40023 Open angle with borderline findings, high risk, bilateral: Secondary | ICD-10-CM | POA: Diagnosis not present

## 2022-09-24 DIAGNOSIS — Z961 Presence of intraocular lens: Secondary | ICD-10-CM | POA: Diagnosis not present

## 2022-09-24 DIAGNOSIS — H04123 Dry eye syndrome of bilateral lacrimal glands: Secondary | ICD-10-CM | POA: Diagnosis not present

## 2022-10-11 ENCOUNTER — Other Ambulatory Visit: Payer: Self-pay | Admitting: Internal Medicine

## 2022-10-12 ENCOUNTER — Other Ambulatory Visit: Payer: Self-pay | Admitting: Internal Medicine

## 2022-10-28 ENCOUNTER — Ambulatory Visit (INDEPENDENT_AMBULATORY_CARE_PROVIDER_SITE_OTHER): Payer: Medicare Other | Admitting: Neurology

## 2022-10-28 VITALS — BP 148/61 | HR 75 | Ht 66.0 in | Wt 240.0 lb

## 2022-10-28 DIAGNOSIS — R4189 Other symptoms and signs involving cognitive functions and awareness: Secondary | ICD-10-CM | POA: Diagnosis not present

## 2022-10-28 DIAGNOSIS — R451 Restlessness and agitation: Secondary | ICD-10-CM

## 2022-10-28 DIAGNOSIS — R410 Disorientation, unspecified: Secondary | ICD-10-CM | POA: Diagnosis not present

## 2022-10-28 DIAGNOSIS — F5104 Psychophysiologic insomnia: Secondary | ICD-10-CM

## 2022-10-28 DIAGNOSIS — F32A Depression, unspecified: Secondary | ICD-10-CM | POA: Diagnosis not present

## 2022-10-28 MED ORDER — PREGABALIN 75 MG PO CAPS: 75.0000 mg | ORAL_CAPSULE | Freq: Three times a day (TID) | ORAL | 5 refills | Status: DC | PRN

## 2022-10-28 MED ORDER — DONEPEZIL HCL 10 MG PO TABS: 10.0000 mg | ORAL_TABLET | Freq: Every day | ORAL | 11 refills | Status: DC

## 2022-10-28 NOTE — Progress Notes (Signed)
Chief Complaint  Patient presents with   New Patient (Initial Visit)    Rm15 son michael present, Internal referral for hallucinations/dementia concerns: moca was 24      ASSESSMENT AND PLAN  [name] is a 77 y.o. female   Cognitive Impairment Restless leg syndrome Chronic low back pain  Laboratory evaluation to rule out treatable cause for cognitive impairment, also including ferritin level  She has developed augmentation, restless leg symptoms in the morning time, will decrease Requip to 1 mg every night only, Lyrica 75 mg 3 times a day as needed  Add on Aricept 10 mg every day  Return To Clinic With NP In 6 Months   DIAGNOSTIC DATA (LABS, IMAGING, TESTING) - I reviewed patient records, labs, notes, testing and imaging myself where available.   MEDICAL HISTORY:  [name] is a 77 year old female, present with her son [name], seen in request by her primary care physician Dr.   Zola Button, [place], for evaluation of worsening memory loss, initial evaluation October 28, 2022   I reviewed and summarized the referring note. PMHX. HLD HTN Stroke Obese  She lives with her only child, her son [name] at home, retired from post office, her father suffered dementia  Around 2021, she was noted to have mild memory loss, gradually getting worse, tends to repeat herself, especially after sun down.  1 day she thought her son was with her, talked with her son all day long, by evening realized he has been at work.  MoCA examination 25/30, but she denies hallucinations  MRI of the brain June 2024, mild small vessel disease, tiny infarction at the right cerebellar hemisphere, generalized atrophy  Lab in 2024, normal CBC lipid profile, CMP showed slight decreased calcium 8.8  She also has restless leg symptoms, taking Requip 1 mg twice a day, tends to have worse restless leg symptoms at nighttime, difficulty falling to sleep, but sometimes happen during the day as well,  also complains of chronic low back pain, gabapentin made her like a zombie, taking Lyrica 75 mg twice a day tolerating it better,  PHYSICAL EXAM:   Vitals:   10/28/22 1350  BP: (!) 148/61  Pulse: 75  Weight: 240 lb (108.9 kg)  Height: 5\' 6"  (1.676 m)   Body mass index is 38.74 kg/m.  PHYSICAL EXAMNIATION:  Gen: NAD, conversant, well nourised, well groomed                     Cardiovascular: Regular rate rhythm, no peripheral edema, warm, nontender. Eyes: Conjunctivae clear without exudates or hemorrhage Neck: Supple, no carotid bruits. Pulmonary: Clear to auscultation bilaterally   NEUROLOGICAL EXAM:  MENTAL STATUS: Speech/cognition: Awake, alert, oriented to history taking and casual conversation    10/28/2022    1:55 PM  Montreal Cognitive Assessment   Visuospatial/ Executive (0/5) 3  Naming (0/3) 2  Attention: Read list of digits (0/2) 2  Attention: Read list of letters (0/1) 1  Attention: Serial 7 subtraction starting at 100 (0/3) 3  Language: Repeat phrase (0/2) 2  Language : Fluency (0/1) 1  Abstraction (0/2) 2  Delayed Recall (0/5) 3  Orientation (0/6) 6  Total 25    CRANIAL NERVES: CN II: Visual fields are full to confrontation. Pupils are round equal and briskly reactive to light. CN III, IV, VI: extraocular movement are normal. No ptosis. CN V: Facial sensation is intact to light touch CN VII: Face is symmetric with normal eye  closure  CN VIII: Hearing is normal to causal conversation. CN IX, X: Phonation is normal. CN XI: Head turning and shoulder shrug are intact  MOTOR: There is no pronator drift of out-stretched arms. Muscle bulk and tone are normal. Muscle strength is normal.  REFLEXES: Reflexes are 1 and symmetric at the biceps, triceps, knees, and ankles. Plantar responses are flexor.  SENSORY: Intact to light touch, pinprick and vibratory sensation are intact in fingers and toes.  COORDINATION: There is no trunk or limb dysmetria  noted.  GAIT/STANCE: Push-up to get up from seated position, antalgic limited by her big body habitus  REVIEW OF SYSTEMS:  Full 14 system review of systems performed and notable only for as above All other review of systems were negative.   ALLERGIES: Allergies  Allergen Reactions   Dextromethorphan-Guaifenesin Nausea And Vomiting   Morphine Nausea And Vomiting   Gabapentin Other (See Comments)    Dizziness, made her feel loopy   Penicillins Rash    "> 30 years ago; best I can remember it was just a light rash on my arm" Did it involve swelling of the face/tongue/throat, SOB, or low BP? No Did it involve sudden or severe rash/hives, skin peeling, or any reaction on the inside of your mouth or nose? Unknown Did you need to seek medical attention at a hospital or doctor's office? No When did it last happen?      more than 30 years  If all above answers are "NO", may proceed with cephalosporin use.     HOME MEDICATIONS: Current Outpatient Medications  Medication Sig Dispense Refill   albuterol (VENTOLIN HFA) 108 (90 Base) MCG/ACT inhaler TAKE 2 PUFFS BY MOUTH EVERY 6 HOURS AS NEEDED FOR WHEEZE OR SHORTNESS OF BREATH 18 each 5   Cholecalciferol (VITAMIN D) 125 MCG (5000 UT) CAPS Take 5,000 Units by mouth daily.     Cyanocobalamin (VITAMIN B12) 1000 MCG TBCR Take 1 tablet by mouth daily.     dicyclomine (BENTYL) 20 MG tablet Take 1 tablet (20 mg total) by mouth 4 (four) times daily -  before meals and at bedtime. 360 tablet 3   HYDROcodone-acetaminophen (NORCO) 10-325 MG tablet Take 1 tablet by mouth every 6 (six) hours as needed.     loperamide (IMODIUM) 2 MG capsule Patient takes 2-4 tablets by mouth as needed for diarrhea or loose stools     metoprolol succinate (TOPROL-XL) 50 MG 24 hr tablet TAKE 1 TABLET BY MOUTH DAILY. TAKE WITH OR IMMEDIATELY FOLLOWING A MEAL. 90 tablet 2   NARCAN 4 MG/0.1ML LIQD nasal spray kit Place 1 spray into the nose once as needed (opioid od).      nitroGLYCERIN (NITROSTAT) 0.4 MG SL tablet Place 1 tablet (0.4 mg total) under the tongue every 5 (five) minutes as needed for chest pain. 90 tablet 3   omeprazole (PRILOSEC) 40 MG capsule Take 1 capsule (40 mg total) by mouth 2 (two) times daily. 180 capsule 3   ondansetron (ZOFRAN) 4 MG tablet Take 1 tablet (4 mg total) by mouth 2 (two) times daily as needed for nausea or vomiting. 60 tablet 1   pregabalin (LYRICA) 75 MG capsule Take 75 mg by mouth 3 (three) times daily.     promethazine (PHENERGAN) 25 MG tablet Take 25 mg by mouth every 6 (six) hours as needed for nausea or vomiting.     Propylene Glycol 0.6 % SOLN Apply 2 drops to eye as needed (for dry eyes).  rOPINIRole (REQUIP) 1 MG tablet Take 1 tablet (1 mg total) by mouth 2 (two) times daily. 180 tablet 1   rosuvastatin (CRESTOR) 40 MG tablet Take 1 tablet (40 mg total) by mouth at bedtime. 90 tablet 1   sacubitril-valsartan (ENTRESTO) 97-103 MG Take 1 tablet by mouth 2 (two) times daily. 180 tablet 3   sertraline (ZOLOFT) 50 MG tablet Take 1.5 tablets (75 mg total) by mouth daily. 135 tablet 1   solifenacin (VESICARE) 5 MG tablet Take 1 tablet (5 mg total) by mouth daily. 90 tablet 1   traZODone (DESYREL) 50 MG tablet TAKE 3 TABLETS (150 MG TOTAL) BY MOUTH DAILY AS NEEDED FOR SLEEP. 270 tablet 0   No current facility-administered medications for this visit.    PAST MEDICAL HISTORY: Past Medical History:  Diagnosis Date   Allergy    Anemia    Anxiety    Barrett's esophagus    Bilateral carpal tunnel syndrome    Dr. Renae Fickle, having injection therapy   C. difficile diarrhea 08/01/2012   severe 2014   Carotid arterial disease (HCC)    Cataract    removed both eyes    Chronic combined systolic and diastolic CHF (congestive heart failure) (HCC)    Chronic cystitis    Dr. Sherron Monday   Chronic lower back pain    Chronic pain syndrome    Depression    Dizziness    DJD (degenerative joint disease)    bilateral hands; knees    Family history of anesthesia complication    Mother had severe N/V   Fibromyalgia    GERD (gastroesophageal reflux disease)    H/O cardiac catheterization    (-) cath 12-2002  , cath again 2011 (-)   History of colon polyps    History of nuclear stress test    Myoview 3/23: EF 51, no ischemia; low risk (echocardiogram in 11/22 with normal EF)   HTN (hypertension)    Hyperlipidemia    past hx    IBS (irritable bowel syndrome)    Insomnia    Migraine    Morbid obesity (HCC)    Non-ischemic cardiomyopathy (HCC)    Osteoporosis    pt unsure of this   RLS (restless legs syndrome)    Vertigo    Vitamin B 12 deficiency 04/09/2013    PAST SURGICAL HISTORY: Past Surgical History:  Procedure Laterality Date   Arm surgery Left    "don't remember what they did; arm wasn't broken"   BILATERAL KNEE ARTHROSCOPY Bilateral    BIOPSY  08/07/2018   Procedure: BIOPSY;  Surgeon: Meryl Dare, MD;  Location: [facility];  Service: Endoscopy;;   CARDIAC CATHETERIZATION  01/22/10   clean cath   CATARACT EXTRACTION W/ INTRAOCULAR LENS  IMPLANT, BILATERAL Bilateral    CHOLECYSTECTOMY N/A 10/25/2016   Procedure: LAPAROSCOPIC CHOLECYSTECTOMY;  Surgeon: Violeta Gelinas, MD;  Location: [facility] OR;  Service: General;  Laterality: N/A;   COLONOSCOPY     COLONOSCOPY W/ POLYPECTOMY     ESOPHAGOGASTRODUODENOSCOPY (EGD) WITH PROPOFOL N/A 08/07/2018   Procedure: ESOPHAGOGASTRODUODENOSCOPY (EGD) WITH PROPOFOL;  Surgeon: Meryl Dare, MD;  Location: [facility];  Service: Endoscopy;  Laterality: N/A;   FOOT SURGERY Bilateral    toenails removed; callus removed on right; hammertoes right"   Knot     "removed from right neck; not a goiter"   LUMBAR DISC SURGERY     L5 S1 anterior fusion   OOPHORECTOMY     RIGHT/LEFT HEART CATH AND CORONARY ANGIOGRAPHY  N/A 05/06/2017   Procedure: RIGHT/LEFT HEART CATH AND CORONARY ANGIOGRAPHY;  Surgeon: Yvonne Kendall, MD;  Location: [facility];  Service:  Cardiovascular;  Laterality: N/A;   SHOULDER ARTHROSCOPY Right    TOTAL KNEE ARTHROPLASTY  10/05/2011   Procedure: TOTAL KNEE ARTHROPLASTY;  Surgeon: Velna Ochs, MD;  Location: [facility] OR;  Service: Orthopedics;  Laterality: Right;   UPPER GASTROINTESTINAL ENDOSCOPY     VAGINAL HYSTERECTOMY     for endometriosis    FAMILY HISTORY: Family History  Problem Relation Age of Onset   Lung cancer Mother    Dementia Father    CAD Father        dx in his 29s   Stroke Father    Congestive Heart Failure Father    Colon cancer Maternal Grandfather    Esophageal cancer Brother    Breast cancer Sister    Diabetes Other        Aunt   Colon cancer Maternal Uncle        2   Hepatitis C Brother    Rectal cancer Neg Hx    Stomach cancer Neg Hx    Colon polyps Neg Hx     SOCIAL HISTORY: Social History   Socioeconomic History   Marital status: Divorced    Spouse name: Not on file   Number of children: 2   Years of education: 12   Highest education level: 12th grade  Occupational History   Occupation: Retired, post Sales promotion account executive: RETIRED  Tobacco Use   Smoking status: Former    Current packs/day: 0.00    Average packs/day: 0.5 packs/day for 10.0 years (5.0 ttl pk-yrs)    Types: Cigarettes    Start date: 02/08/1965    Quit date: 02/09/1975    Years since quitting: 47.7   Smokeless tobacco: Never   Tobacco comments:    started at age 84.   quit in the 39s.  Vaping Use   Vaping status: Never Used  Substance and Sexual Activity   Alcohol use: Not Currently   Drug use: No    Comment: CBD and hemp OIL    Sexual activity: Not Currently    Partners: Male  Other Topics Concern   Not on file  Social History Narrative   Son lives with her.   Right-handed.   2 soft drinks per day.   Social Determinants of Health   Financial Resource Strain: Low Risk  (10/28/2022)   Overall Financial Resource Strain (CARDIA)    Difficulty of Paying Living Expenses: Not hard at all  Food  Insecurity: No Food Insecurity (10/28/2022)   Hunger Vital Sign    Worried About Running Out of Food in the Last Year: Never true    Ran Out of Food in the Last Year: Never true  Transportation Needs: No Transportation Needs (10/28/2022)   PRAPARE - Administrator, Civil Service (Medical): No    Lack of Transportation (Non-Medical): No  Physical Activity: Inactive (10/28/2022)   Exercise Vital Sign    Days of Exercise per Week: 0 days    Minutes of Exercise per Session: 0 min  Stress: No Stress Concern Present (10/28/2022)   [employer] of Occupational Health - Occupational Stress Questionnaire    Feeling of Stress : Not at all  Social Connections: Socially Isolated (10/28/2022)   Social Connection and Isolation Panel [NHANES]    Frequency of Communication with Friends and Family: More than three times a week  Frequency of Social Gatherings with Friends and Family: Once a week    Attends Religious Services: Never    Database administrator or Organizations: No    Attends Banker Meetings: Never    Marital Status: Divorced  Catering manager Violence: Not At Risk (11/09/2021)   Humiliation, Afraid, Rape, and Kick questionnaire    Fear of Current or Ex-Partner: No    Emotionally Abused: No    Physically Abused: No    Sexually Abused: No      Levert Feinstein, M.D. Ph.D.  [facility] [address] [place], [place] [address] Ph: [phone] Fax: [phone]  CC:  Donato Schultz, [place] [address] [place],  [place] [address]  Wanda Plump, MD

## 2022-10-29 ENCOUNTER — Ambulatory Visit (INDEPENDENT_AMBULATORY_CARE_PROVIDER_SITE_OTHER): Payer: Medicare Other | Admitting: Internal Medicine

## 2022-10-29 ENCOUNTER — Encounter: Payer: Self-pay | Admitting: Internal Medicine

## 2022-10-29 VITALS — BP 138/88 | HR 58 | Temp 98.4°F | Resp 18 | Ht 66.0 in | Wt 240.0 lb

## 2022-10-29 DIAGNOSIS — F5104 Psychophysiologic insomnia: Secondary | ICD-10-CM | POA: Diagnosis not present

## 2022-10-29 DIAGNOSIS — G2581 Restless legs syndrome: Secondary | ICD-10-CM | POA: Diagnosis not present

## 2022-10-29 DIAGNOSIS — F419 Anxiety disorder, unspecified: Secondary | ICD-10-CM

## 2022-10-29 DIAGNOSIS — E782 Mixed hyperlipidemia: Secondary | ICD-10-CM

## 2022-10-29 DIAGNOSIS — F32A Depression, unspecified: Secondary | ICD-10-CM

## 2022-10-29 LAB — LIPID PANEL
Cholesterol: 139 mg/dL (ref 0–200)
HDL: 58.4 mg/dL (ref >39.00)
LDL Cholesterol: 52 mg/dL (ref 0–99)
NonHDL: 80.84
Total CHOL/HDL Ratio: 2
Triglycerides: 143 mg/dL (ref 0.0–149.0)
VLDL: 28.6 mg/dL (ref 0.0–40.0)

## 2022-10-29 LAB — IRON,TIBC AND FERRITIN PANEL
Ferritin: 48 ng/mL (ref 15–150)
Iron Saturation: 24 % (ref 15–55)
Iron: 72 ug/dL (ref 27–139)
Total Iron Binding Capacity: 302 ug/dL (ref 250–450)
UIBC: 230 ug/dL (ref 118–369)

## 2022-10-29 LAB — ALT: ALT: 8 U/L (ref 0–35)

## 2022-10-29 LAB — VITAMIN B12: Vitamin B-12: 328 pg/mL (ref 232–1245)

## 2022-10-29 LAB — HGB A1C W/O EAG: Hgb A1c MFr Bld: 5.8 % — ABNORMAL HIGH (ref 4.8–5.6)

## 2022-10-29 LAB — RPR: RPR Ser Ql: NONREACTIVE

## 2022-10-29 LAB — AST: AST: 12 U/L (ref 0–37)

## 2022-10-29 NOTE — Progress Notes (Signed)
Subjective:    Patient ID: [name], female    DOB: 1945-10-25, 77 y.o.   MRN: [id number]  DOS:  10/29/2022 Type of visit - description: Follow-up  Here with her son. Saw neurology, note reviewed.  O2 sat is slightly low today, she does not feel short of breath today. She does have occasional cough and wheezing, uses albuterol less than twice a week.   Review of Systems See above   Past Medical History:  Diagnosis Date   Allergy    Anemia    Anxiety    Barrett's esophagus    Bilateral carpal tunnel syndrome    Dr. Renae Fickle, having injection therapy   C. difficile diarrhea 08/01/2012   severe 2014   Carotid arterial disease (HCC)    Cataract    removed both eyes    Chronic combined systolic and diastolic CHF (congestive heart failure) (HCC)    Chronic cystitis    Dr. Sherron Monday   Chronic lower back pain    Chronic pain syndrome    Depression    Dizziness    DJD (degenerative joint disease)    bilateral hands; knees   Family history of anesthesia complication    Mother had severe N/V   Fibromyalgia    GERD (gastroesophageal reflux disease)    H/O cardiac catheterization    (-) cath 12-2002  , cath again 2011 (-)   History of colon polyps    History of nuclear stress test    Myoview 3/23: EF 51, no ischemia; low risk (echocardiogram in 11/22 with normal EF)   HTN (hypertension)    Hyperlipidemia    past hx    IBS (irritable bowel syndrome)    Insomnia    Migraine    Morbid obesity (HCC)    Non-ischemic cardiomyopathy (HCC)    Osteoporosis    pt unsure of this   RLS (restless legs syndrome)    Vertigo    Vitamin B 12 deficiency 04/09/2013    Past Surgical History:  Procedure Laterality Date   Arm surgery Left    "don't remember what they did; arm wasn't broken"   BILATERAL KNEE ARTHROSCOPY Bilateral    BIOPSY  08/07/2018   Procedure: BIOPSY;  Surgeon: Meryl Dare, MD;  Location: [facility];  Service: Endoscopy;;   CARDIAC CATHETERIZATION   01/22/10   clean cath   CATARACT EXTRACTION W/ INTRAOCULAR LENS  IMPLANT, BILATERAL Bilateral    CHOLECYSTECTOMY N/A 10/25/2016   Procedure: LAPAROSCOPIC CHOLECYSTECTOMY;  Surgeon: Violeta Gelinas, MD;  Location: [facility] OR;  Service: General;  Laterality: N/A;   COLONOSCOPY     COLONOSCOPY W/ POLYPECTOMY     ESOPHAGOGASTRODUODENOSCOPY (EGD) WITH PROPOFOL N/A 08/07/2018   Procedure: ESOPHAGOGASTRODUODENOSCOPY (EGD) WITH PROPOFOL;  Surgeon: Meryl Dare, MD;  Location: [facility];  Service: Endoscopy;  Laterality: N/A;   FOOT SURGERY Bilateral    toenails removed; callus removed on right; hammertoes right"   Knot     "removed from right neck; not a goiter"   LUMBAR DISC SURGERY     L5 S1 anterior fusion   OOPHORECTOMY     RIGHT/LEFT HEART CATH AND CORONARY ANGIOGRAPHY N/A 05/06/2017   Procedure: RIGHT/LEFT HEART CATH AND CORONARY ANGIOGRAPHY;  Surgeon: Yvonne Kendall, MD;  [facility];  Service: Cardiovascular;  Laterality: N/A;   SHOULDER ARTHROSCOPY Right    TOTAL KNEE ARTHROPLASTY  10/05/2011   Procedure: TOTAL KNEE ARTHROPLASTY;  Surgeon: Velna Ochs, MD;  Location: [facility] OR;  Service: Orthopedics;  Laterality: Right;   UPPER GASTROINTESTINAL ENDOSCOPY     VAGINAL HYSTERECTOMY     for endometriosis    Current Outpatient Medications  Medication Instructions   albuterol (VENTOLIN HFA) 108 (90 Base) MCG/ACT inhaler TAKE 2 PUFFS BY MOUTH EVERY 6 HOURS AS NEEDED FOR WHEEZE OR SHORTNESS OF BREATH   Cyanocobalamin (VITAMIN B12) 1000 MCG TBCR 1 tablet, Oral, Daily   dicyclomine (BENTYL) 20 mg, Oral, 3 times daily before meals & bedtime   donepezil (ARICEPT) 10 mg, Oral, Daily at bedtime   HYDROcodone-acetaminophen (NORCO) 10-325 MG tablet 1 tablet, Oral, Every 6 hours PRN   loperamide (IMODIUM) 2 MG capsule Patient takes 2-4 tablets by mouth as needed for diarrhea or loose stools   metoprolol succinate (TOPROL-XL) 50 mg, Oral, Daily, TAKE WITH OR IMMEDIATELY FOLLOWING A  MEAL.   NARCAN 4 MG/0.1ML LIQD nasal spray kit 1 spray, Once PRN   nitroGLYCERIN (NITROSTAT) 0.4 mg, Sublingual, Every 5 min PRN   omeprazole (PRILOSEC) 40 mg, Oral, 2 times daily   ondansetron (ZOFRAN) 4 mg, Oral, 2 times daily PRN   pregabalin (LYRICA) 75 mg, Oral, 3 times daily PRN   Propylene Glycol 0.6 % SOLN 2 drops, Ophthalmic, As needed   rOPINIRole (REQUIP) 1 mg, Oral, 2 times daily   rosuvastatin (CRESTOR) 40 mg, Oral, Daily at bedtime   sacubitril-valsartan (ENTRESTO) 97-103 MG 1 tablet, Oral, 2 times daily   sertraline (ZOLOFT) 75 mg, Oral, Daily   solifenacin (VESICARE) 5 mg, Oral, Daily   traZODone (DESYREL) 50 MG tablet TAKE 3 TABLETS (150 MG TOTAL) BY MOUTH DAILY AS NEEDED FOR SLEEP.   Vitamin D 5,000 Units, Oral, Daily       Objective:   Physical Exam BP 138/88   Pulse (!) 58   Temp 98.4 F (36.9 C) (Oral)   Resp 18   Ht 5\' 6"  (1.676 m)   Wt 240 lb (108.9 kg)   SpO2 93%   BMI 38.74 kg/m  General:   Well developed, NAD, BMI noted. HEENT:  Normocephalic . Face symmetric, atraumatic Lungs:  CTA B Normal respiratory effort, no intercostal retractions, no accessory muscle use. Heart: RRR,  no murmur.  Lower extremities: no pretibial edema bilaterally  Skin: Not pale. Not jaundice Neurologic:  alert & oriented X3.  Speech normal, gait assisted by a cane  psych--  Cognition and judgment appear intact.  Cooperative with normal attention span and concentration.  Behavior appropriate. No anxious or depressed appearing.      Assessment   Assessment   HTN Hyperlipidemia-  Pravachol, : Myalgias. Intolerant to Zetia (joint aches), see OV  06/2016  Depression, Anxiety (on clonazepam), insomnia (on trazodone):  depression x many  years, remotely on zoloft and other meds per psych, I rx lexapro 2015, then was rx effexor x a while Morbid obesity CV: CHF/ non ischemic cardiomyopathy dx 04-2017; cath normal coronaries, EF ~ 30% Carotid artery disease: 1 to 39%  bilateral carotids 09-2016, start aspirin per neurology GI:  --GERD, Barrett's esophagus, had a EGD 07/2018  --h/o persistent C. difficile diarrhea 2014 --Cholecystectomy B12 deficiency Osteopenia: Dexa 2015 showed osteopenia, T score -1.9 (10/2016): tscore 2021 - 1.8; Rx cs, vit D PULM: --NPSG 2008:  No osa, PLMS 4/hr with arousal and awakening --Home sleep study 11-2020 mild sleep apnea, further advised per pulmonary. --RLS - Requip --Has albuterol, prn Migraines, f/u [facility] -- off topamax as off 06-2016 MSK: --Back pain, chronic: pain meds rx by ortho, Workers  comp MD @ [place] pain mng[facility] [place] --DJD,CTS B ([facility]) --Fibromyalgia.  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  anticoag until 08-2020.  PLAN HTN: No ambulatory BPs, BP today is okay.  Continue present care. High cholesterol: Due to a new infarct noted on MRI 07/2022, we decided to continue aspirin and increase rosuvastatin to 40 mg.  Check FLP AST ALT. Not taking aspirin, recommend to start. Cognitive impairment: Saw neurologist yesterday, blood work for treatable causes negative, Rx Aricept which will start soon. RLS: She developed augmentation, decrease Requip to 1 mg nightly, Lyrica 75 mg as needed (per neurology). Depression anxiety insomnia: Again, insomnia is not well-controlled, I have attempted many times to help her and I was unable to, recommend to see psych if more help is needed. GI symptoms: Saw GI recently and has a follow-up soon.  On PPIs, symptoms improving. Hypocalcemia: Noted on chart review, check ionized calcium. Vaccine advice provided:  states he will not proceed with vaccines.  Benefits discussed. Last MMG 2022, per guidelines okay to stop. RTC 4 months routine checkup

## 2022-10-29 NOTE — Assessment & Plan Note (Signed)
HTN: No ambulatory BPs, BP today is okay.  Continue present care. High cholesterol: Due to a new infarct noted on MRI 07/2022, we decided to continue aspirin and increase rosuvastatin to 40 mg.  Check FLP AST ALT. Not taking aspirin, recommend to start. Cognitive impairment: Saw neurologist yesterday, blood work for treatable causes negative, Rx Aricept which will start soon. RLS: She developed augmentation, decrease Requip to 1 mg nightly, Lyrica 75 mg as needed (per neurology). Depression anxiety insomnia: Again, insomnia is not well-controlled, I have attempted many times to help her and I was unable to, recommend to see psych if more help is needed. GI symptoms: Saw GI recently and has a follow-up soon.  On PPIs, symptoms improving. Hypocalcemia: Noted on chart review, check ionized calcium. Vaccine advice provided:  states he will not proceed with vaccines.  Benefits discussed. Last MMG 2022, per guidelines okay to stop. RTC 4 months routine checkup

## 2022-10-29 NOTE — Patient Instructions (Addendum)
Vaccines I recommend: Tdap (tetanus) Shingrix (shingles) RSV vaccine A COVID-vaccine Flu shot every fall  Check the  blood pressure regularly Blood pressure goal:  between 110/65 and  135/85. If it is consistently higher or lower, let me know     GO TO THE LAB : Get the blood work     Next visit with me in 4 months for a routine checkup    Please schedule it at the front desk

## 2022-10-31 LAB — CALCIUM, IONIZED: Calcium, Ion: 4.9 mg/dL (ref 4.7–5.5)

## 2022-11-10 ENCOUNTER — Telehealth: Payer: Self-pay | Admitting: Internal Medicine

## 2022-11-10 NOTE — Telephone Encounter (Signed)
Copied from CRM [id number]. Topic: Medicare AWV >> Nov 10, 2022  3:23 PM Payton Doughty wrote: Reason for CRM: Called LM 11/10/2022 to schedule AWV   Verlee Rossetti; Care Guide Ambulatory Clinical Support [place] [facility] Direct Dial: [phone]

## 2022-11-22 ENCOUNTER — Other Ambulatory Visit (HOSPITAL_COMMUNITY): Payer: Self-pay

## 2022-12-10 DIAGNOSIS — K580 Irritable bowel syndrome with diarrhea: Secondary | ICD-10-CM | POA: Diagnosis not present

## 2022-12-10 DIAGNOSIS — R112 Nausea with vomiting, unspecified: Secondary | ICD-10-CM | POA: Diagnosis not present

## 2022-12-10 DIAGNOSIS — K2970 Gastritis, unspecified, without bleeding: Secondary | ICD-10-CM | POA: Diagnosis not present

## 2022-12-24 ENCOUNTER — Encounter: Payer: Self-pay | Admitting: Cardiology

## 2022-12-24 ENCOUNTER — Ambulatory Visit: Payer: Medicare Other | Attending: Cardiology | Admitting: Cardiology

## 2022-12-24 VITALS — BP 112/80 | HR 74 | Ht 66.0 in | Wt 236.8 lb

## 2022-12-24 DIAGNOSIS — I10 Essential (primary) hypertension: Secondary | ICD-10-CM

## 2022-12-24 DIAGNOSIS — I428 Other cardiomyopathies: Secondary | ICD-10-CM | POA: Diagnosis present

## 2022-12-24 DIAGNOSIS — I5032 Chronic diastolic (congestive) heart failure: Secondary | ICD-10-CM

## 2022-12-24 NOTE — Progress Notes (Signed)
Cardiology Office Note:  .   Date:  12/24/2022  ID:  [name], DOB 12-05-1945, MRN [id number] PCP: Wanda Plump, MD   HeartCare Providers Cardiologist:  Donato Schultz, MD     History of Present Illness: [name]   [name] is a 77 y.o. female Discussed with the use of AI scribe  History of Present Illness   The patient is a 77 year old female with a history of chronic systolic heart failure, initially presenting with an ejection fraction of 30-35%. Cardiac catheterization in 2019 showed no evidence of coronary artery disease, and a repeat echocardiogram in 2021 showed a normal ejection fraction of 55-60%. A nuclear stress test in 2023 also showed no ischemia and a normal ejection fraction.  In 2024, the patient was admitted to the emergency department due to confusion. A CT scan of the head showed no acute abnormalities, and neurology recommended an MRI and EEG, but the patient opted to return home.  The patient has been on disability since 2007 due to a work-related injury. She has mild obstructive sleep apnea, diagnosed in 2022, but is not interested in CPAP therapy. The patient admits to consuming soda and sweet tea, expressing a dislike for water.  The patient has been on goal-directed medical therapy with Entresto and metoprolol. Her LDL has been 62 on Crestor. An echocardiogram in 2022 showed mild aortic insufficiency.  The patient reports experiencing chest pain and fatigue. She uses a cane for mobility and is able to perform light activities such as sweeping the floor, but needs to take breaks. She also reports back pain and pain between the shoulder blades.  The patient was admitted to the ER twice in the past year due to confusion. 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          Studies Reviewed: .        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 (07/21/2022)  DIAGNOSTIC Cardiac catheterization: No evidence of coronary artery disease (05/06/2017) Echocardiogram: Normal ejection fraction of 55-60% (February 2021) Nuclear stress test: No ischemia, low risk, normal ejection fraction (04/10/2021) Sleep study: Mild obstructive sleep apnea (October 2022) Echocardiogram: Mild aortic insufficiency (2022)  Risk Assessment/Calculations:            Physical Exam:   VS:  BP 112/80   Pulse 74   Ht 5\' 6"  (1.676 m)   Wt 236 lb 12.8 oz (107.4 kg)   SpO2 98%   BMI 38.22 kg/m    Wt Readings from Last 3 Encounters:  12/24/22 236 lb 12.8 oz (107.4 kg)  10/29/22 240 lb (108.9 kg)  10/28/22 240 lb (108.9 kg)    GEN: Well nourished, well developed in no acute distress NECK: No JVD; No carotid bruits CARDIAC: RRR, no murmurs, no rubs, no gallops RESPIRATORY:  Clear to auscultation without rales, wheezing or rhonchi  ABDOMEN: Soft, non-tender, non-distended EXTREMITIES:  No edema; No deformity   ASSESSMENT AND PLAN: .    Assessment and Plan    Chronic Systolic Heart Failure Chronic systolic heart failure with initial ejection fraction of 30-35%, improved to 55-60% on recent echocardiogram. Reports chest pain and fatigue. Nuclear stress test on 04/10/2021 showed no ischemia and normal ejection fraction. On goal-directed  medical therapy with Entresto and metoprolol. 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 Episodes of confusion. Prefers not to use ambulance for transport. - Advise to call an ambulance if stroke symptoms last  more than a minute and a half - Follow up with neurology as needed  Aortic Insufficiency Mild aortic insufficiency noted on echocardiogram in 2022. No current symptoms reported. - Continue current management - Monitor for symptoms  Hyperlipidemia LDL well controlled at 52 mg/dL on Crestor 40 mg daily. - Continue Crestor 40 mg daily - Monitor lipid levels regularly  Obstructive Sleep Apnea Mild obstructive sleep apnea diagnosed on sleep study in October 2022. Not interested in CPAP therapy. - No changes to current management  General Health Maintenance Encouraged to maintain physical activity and monitor for any significant changes in health. - Encourage physical activity as tolerated - Follow up in one year  Follow-up - Follow up in one year - Contact healthcare provider if significant changes occur.              Signed, Donato Schultz, MD

## 2022-12-24 NOTE — Patient Instructions (Signed)

## 2022-12-25 ENCOUNTER — Other Ambulatory Visit: Payer: Self-pay | Admitting: Internal Medicine

## 2023-01-09 ENCOUNTER — Other Ambulatory Visit: Payer: Self-pay | Admitting: Cardiology

## 2023-01-23 ENCOUNTER — Other Ambulatory Visit: Payer: Self-pay | Admitting: Cardiology

## 2023-02-17 DIAGNOSIS — R519 Headache, unspecified: Secondary | ICD-10-CM | POA: Diagnosis not present

## 2023-02-17 DIAGNOSIS — Z03818 Encounter for observation for suspected exposure to other biological agents ruled out: Secondary | ICD-10-CM | POA: Diagnosis not present

## 2023-02-17 DIAGNOSIS — J020 Streptococcal pharyngitis: Secondary | ICD-10-CM | POA: Diagnosis not present

## 2023-02-17 DIAGNOSIS — R051 Acute cough: Secondary | ICD-10-CM | POA: Diagnosis not present

## 2023-03-04 ENCOUNTER — Ambulatory Visit: Payer: Medicare Other | Admitting: Internal Medicine

## 2023-03-13 ENCOUNTER — Other Ambulatory Visit: Payer: Self-pay | Admitting: Internal Medicine

## 2023-03-18 ENCOUNTER — Ambulatory Visit: Payer: Medicare Other | Admitting: Internal Medicine

## 2023-03-20 ENCOUNTER — Other Ambulatory Visit: Payer: Self-pay | Admitting: Cardiology

## 2023-03-20 ENCOUNTER — Other Ambulatory Visit: Payer: Self-pay | Admitting: Internal Medicine

## 2023-05-06 DIAGNOSIS — R197 Diarrhea, unspecified: Secondary | ICD-10-CM | POA: Diagnosis not present

## 2023-05-06 DIAGNOSIS — K2970 Gastritis, unspecified, without bleeding: Secondary | ICD-10-CM | POA: Diagnosis not present

## 2023-05-06 DIAGNOSIS — Z9049 Acquired absence of other specified parts of digestive tract: Secondary | ICD-10-CM | POA: Diagnosis not present

## 2023-05-06 DIAGNOSIS — R112 Nausea with vomiting, unspecified: Secondary | ICD-10-CM | POA: Diagnosis not present

## 2023-05-12 ENCOUNTER — Other Ambulatory Visit: Payer: Self-pay | Admitting: Cardiology

## 2023-05-19 DIAGNOSIS — R197 Diarrhea, unspecified: Secondary | ICD-10-CM | POA: Diagnosis not present

## 2023-05-19 DIAGNOSIS — R112 Nausea with vomiting, unspecified: Secondary | ICD-10-CM | POA: Diagnosis not present

## 2023-05-24 ENCOUNTER — Other Ambulatory Visit: Payer: Self-pay | Admitting: Internal Medicine

## 2023-05-26 ENCOUNTER — Encounter: Payer: Self-pay | Admitting: Internal Medicine

## 2023-06-02 ENCOUNTER — Ambulatory Visit: Payer: Medicare Other | Admitting: Neurology

## 2023-06-03 ENCOUNTER — Other Ambulatory Visit: Payer: Self-pay | Admitting: Internal Medicine

## 2023-06-03 NOTE — Telephone Encounter (Signed)
 Overdue for appt. Letter sent to Pt on 05/26/23

## 2023-06-03 NOTE — Telephone Encounter (Signed)
 Copied from CRM [id number]. Topic: Clinical - Medication Refill >> Jun 03, 2023 12:17 PM Shereese L wrote: Most Recent Primary Care Visit:  Provider: Devonna Foley E  Department: [facility]  Visit Type: OFFICE VISIT  Date: 10/29/2022  Medication: sertraline  (ZOLOFT ) 50 MG tablet  Has the patient contacted their pharmacy? No (Agent: If no, request that the patient contact the pharmacy for the refill. If patient does not wish to contact the pharmacy document the reason why and proceed with request.) (Agent: If yes, when and what did the pharmacy advise?)  Is this the correct pharmacy for this prescription? Yes If no, delete pharmacy and type the correct one.  This is the patient's preferred pharmacy:  [facility] - [place], [place] - [address] AT Portal to [facility]istered [employer] [address] [address] Phone: [phone] Fax: [phone]   Has the prescription been filled recently? No  Is the patient out of the medication? Yes  Has the patient been seen for an appointment in the last year OR does the patient have an upcoming appointment? No  Can we respond through MyChart? Yes  Agent: Please be advised that Rx refills may take up to 3 business days. We ask that you follow-up with your pharmacy.

## 2023-06-11 ENCOUNTER — Other Ambulatory Visit: Payer: Self-pay | Admitting: Cardiology

## 2023-06-11 ENCOUNTER — Other Ambulatory Visit: Payer: Self-pay | Admitting: Internal Medicine

## 2023-06-14 ENCOUNTER — Telehealth: Payer: Self-pay

## 2023-06-14 MED ORDER — ROPINIROLE HCL 1 MG PO TABS: 1.0000 mg | ORAL_TABLET | Freq: Two times a day (BID) | ORAL | 0 refills | Status: DC

## 2023-06-14 NOTE — Telephone Encounter (Signed)
 30 day supply sent

## 2023-06-14 NOTE — Telephone Encounter (Signed)
 Copied from CRM [id number]. Topic: Clinical - Prescription Issue >> Jun 14, 2023  1:01 PM Kita Perish H wrote: Reason for CRM: Patients son scheduled appointment for patient per provider request, but states patient will need a refill for the rOPINIRole  (REQUIP ) 1 MG tablet [id number] [place], [place] - [address].

## 2023-06-15 ENCOUNTER — Telehealth: Payer: Self-pay | Admitting: Neurology

## 2023-06-15 DIAGNOSIS — U071 COVID-19: Secondary | ICD-10-CM | POA: Diagnosis not present

## 2023-06-15 DIAGNOSIS — J029 Acute pharyngitis, unspecified: Secondary | ICD-10-CM | POA: Diagnosis not present

## 2023-06-15 NOTE — Telephone Encounter (Signed)
 Pt has a virus and do not feel good, son will call back to reschedule

## 2023-06-16 ENCOUNTER — Emergency Department (HOSPITAL_BASED_OUTPATIENT_CLINIC_OR_DEPARTMENT_OTHER)

## 2023-06-16 ENCOUNTER — Encounter (HOSPITAL_BASED_OUTPATIENT_CLINIC_OR_DEPARTMENT_OTHER): Payer: Self-pay | Admitting: Emergency Medicine

## 2023-06-16 ENCOUNTER — Other Ambulatory Visit: Payer: Self-pay

## 2023-06-16 ENCOUNTER — Ambulatory Visit: Payer: Medicare Other | Admitting: Neurology

## 2023-06-16 ENCOUNTER — Ambulatory Visit: Payer: Self-pay

## 2023-06-16 ENCOUNTER — Emergency Department (HOSPITAL_BASED_OUTPATIENT_CLINIC_OR_DEPARTMENT_OTHER)
Admission: EM | Admit: 2023-06-16 | Discharge: 2023-06-16 | Disposition: A | Discharge status: Home / Self Care | Attending: Emergency Medicine | Admitting: Emergency Medicine

## 2023-06-16 DIAGNOSIS — Z7982 Long term (current) use of aspirin: Secondary | ICD-10-CM | POA: Diagnosis not present

## 2023-06-16 DIAGNOSIS — R059 Cough, unspecified: Secondary | ICD-10-CM | POA: Diagnosis not present

## 2023-06-16 DIAGNOSIS — I2510 Atherosclerotic heart disease of native coronary artery without angina pectoris: Secondary | ICD-10-CM | POA: Diagnosis not present

## 2023-06-16 DIAGNOSIS — R0602 Shortness of breath: Secondary | ICD-10-CM | POA: Insufficient documentation

## 2023-06-16 DIAGNOSIS — I700 Atherosclerosis of aorta: Secondary | ICD-10-CM | POA: Diagnosis not present

## 2023-06-16 DIAGNOSIS — E876 Hypokalemia: Secondary | ICD-10-CM | POA: Insufficient documentation

## 2023-06-16 DIAGNOSIS — U071 COVID-19: Secondary | ICD-10-CM | POA: Insufficient documentation

## 2023-06-16 DIAGNOSIS — K449 Diaphragmatic hernia without obstruction or gangrene: Secondary | ICD-10-CM | POA: Diagnosis not present

## 2023-06-16 LAB — TROPONIN T, HIGH SENSITIVITY
Troponin T High Sensitivity: 27 ng/L — ABNORMAL HIGH (ref <19)
Troponin T High Sensitivity: 26 ng/L — ABNORMAL HIGH (ref <19)

## 2023-06-16 LAB — BASIC METABOLIC PANEL WITH GFR
Anion gap: 18 — ABNORMAL HIGH (ref 5–15)
BUN: 18 mg/dL (ref 8–23)
CO2: 19 mmol/L — ABNORMAL LOW (ref 22–32)
Calcium: 8.9 mg/dL (ref 8.9–10.3)
Chloride: 101 mmol/L (ref 98–111)
Creatinine, Ser: 1.05 mg/dL — ABNORMAL HIGH (ref 0.44–1.00)
GFR, Estimated: 54 mL/min — ABNORMAL LOW (ref >60)
Glucose, Bld: 121 mg/dL — ABNORMAL HIGH (ref 70–99)
Potassium: 3.4 mmol/L — ABNORMAL LOW (ref 3.5–5.1)
Sodium: 138 mmol/L (ref 135–145)

## 2023-06-16 LAB — CBC
HCT: 37.2 % (ref 36.0–46.0)
Hemoglobin: 12.5 g/dL (ref 12.0–15.0)
MCH: 29.7 pg (ref 26.0–34.0)
MCHC: 33.6 g/dL (ref 30.0–36.0)
MCV: 88.4 fL (ref 80.0–100.0)
Platelets: 153 10*3/uL (ref 150–400)
RBC: 4.21 MIL/uL (ref 3.87–5.11)
RDW: 13.2 % (ref 11.5–15.5)
WBC: 5.3 10*3/uL (ref 4.0–10.5)
nRBC: 0 % (ref 0.0–0.2)

## 2023-06-16 LAB — RESP PANEL BY RT-PCR (RSV, FLU A&B, COVID)  RVPGX2
Influenza A by PCR: NEGATIVE
Influenza B by PCR: NEGATIVE
Resp Syncytial Virus by PCR: NEGATIVE
SARS Coronavirus 2 by RT PCR: POSITIVE — AB

## 2023-06-16 LAB — PRO BRAIN NATRIURETIC PEPTIDE: Pro Brain Natriuretic Peptide: 483 pg/mL — ABNORMAL HIGH (ref <300.0)

## 2023-06-16 MED ORDER — IOHEXOL 350 MG/ML SOLN
100.0000 mL | Freq: Once | INTRAVENOUS | Status: AC | PRN
  Administered 2023-06-16: 100 mL via INTRAVENOUS

## 2023-06-16 NOTE — Telephone Encounter (Signed)
 Copied From CRM [id number]. Reason for Triage: Patient son [name] called in stating patient was feeling really bad went to urgent care yesterday and was diagnosed with covid. She is getting worse and starting to feel worse.  Coughing really bad  Blowing nose  Stomach issues, hasn't  ate in 3 days  Not drinking enough and just sleeping. Son would like a callback on what he needs to do    Chief Complaint: COVID Symptoms: Cough, fatigue, loss of appetite, fever, nasal congestion  Frequency: Constant Disposition: [] ED /[] Urgent Care (no appt availability in office) / [x] Appointment(In office/virtual)/ []  [facility]/ [] Home Care/ [] Refused Recommended Disposition /[] [facility]/ []  Follow-up with PCP Additional Notes: Patient's son reports that the patient was taken to urgent care yesterday and diagnosed with COVID. He states that she was prescribed Paxlovid and started taking it last night. He states that her symptoms have been worsening and is concerned that she may have something else going on like a sinus infection as well. Appointment made for tomorrow for evaluation per his request.     Reason for Disposition  [1] HIGH RISK patient (e.g., weak immune system, age > 64 years, obesity with BMI 30 or higher, pregnant, chronic lung disease or other chronic medical condition) AND [2] COVID symptoms (e.g., cough, fever)  (Exceptions: Already seen by PCP and no new or worsening symptoms.)  Answer Assessment - Initial Assessment Questions 1. COVID-19 DIAGNOSIS: "How do you know that you have COVID?" (e.g., positive lab test or self-test, diagnosed by doctor or NP/PA, symptoms after exposure).     Urgent care yesterday  2. COVID-19 EXPOSURE: "Was there any known exposure to COVID before the symptoms began?" CDC Definition of close contact: within 6 feet (2 meters) for a total of 15 minutes or more over a 24-hour period.      Unsure  3. ONSET: "When did the COVID-19 symptoms  start?"      3 days 4. WORST SYMPTOM: "What is your worst symptom?" (e.g., cough, fever, shortness of breath, muscle aches)     Not eating  5. COUGH: "Do you have a cough?" If Yes, ask: "How bad is the cough?"       Yes, moderate  6. FEVER: "Do you have a fever?" If Yes, ask: "What is your temperature, how was it measured, and when did it start?"     100 F 7. RESPIRATORY STATUS: "Describe your breathing?" (e.g., normal; shortness of breath, wheezing, unable to speak)      No 8. BETTER-SAME-WORSE: "Are you getting better, staying the same or getting worse compared to yesterday?"  If getting worse, ask, "In what way?"     Worse than yesterday  9. OTHER SYMPTOMS: "Do you have any other symptoms?"  (e.g., chills, fatigue, headache, loss of smell or taste, muscle pain, sore throat)     Not eating, nasal congestion  10. HIGH RISK DISEASE: "Do you have any chronic medical problems?" (e.g., asthma, heart or lung disease, weak immune system, obesity, etc.)       Yes 11. VACCINE: "Have you had the COVID-19 vaccine?" If Yes, ask: "Which one, how many shots, when did you get it?"       No  Protocols used: Coronavirus (COVID-19) Diagnosed or Suspected-A-AH

## 2023-06-16 NOTE — Discharge Instructions (Signed)
 Please continue your Paxlovid as directed.  Drink plenty of fluids and rest.  Follow-up with your primary care doctor.  Return to the emergency department if any worsening or concerning symptoms

## 2023-06-16 NOTE — Telephone Encounter (Signed)
 Copied From CRM [id number]. Reason for Triage: Patient son Bambi Lever called in stating patient was feeling really bad went to urgent care yesterday and was diagnosed with covid. She is getting worse and starting to feel worse.  Coughing really bad  Blowing nose  Stomach issues, hasn't  ate in 3 days  Not drinking enough and just sleeping. Son would like a callback on what he needs to do    [phone]   1st attempt; left vm.

## 2023-06-16 NOTE — Telephone Encounter (Signed)
 Copied from CRM [id number]. Topic: General - Other >> Jun 16, 2023  4:53 PM Alyse July wrote: Reason for CRM: Patient son would like to inform provider that they are on the way to the ER.

## 2023-06-16 NOTE — Telephone Encounter (Signed)
 Triage reviewed, Pt not eating or drinking. LMOM for Pt's son Bambi Lever, recommended he not wait until tomorrow afternoon for his mom to be re-evaluated, I would recommend he take her to ED for evaluation.

## 2023-06-16 NOTE — ED Provider Notes (Signed)
 [facility] Provider Note   CSN: [id number] Arrival date & time: 06/16/23  1719     History  Chief Complaint  Patient presents with   Shortness of Breath    [name] is a 78 y.o. female.  She is brought in by her son for concern of pneumonia.  She has had cough nonproductive for the last 3 days, associated with general weakness, loss of appetite, nausea.  Low-grade fevers to 99.  Called PCP today who recommended she come to the emergency department to evaluate for pneumonia.  No sick contacts or recent travel.  No chest or abdominal pain no leg swelling.  The history is provided by the patient.  Shortness of Breath Severity:  Moderate Onset quality:  Gradual Duration:  3 days Timing:  Constant Progression:  Unchanged Chronicity:  New Associated symptoms: cough and fever   Associated symptoms: no abdominal pain, no chest pain, no hemoptysis, no sputum production and no vomiting   Risk factors: no tobacco use        Home Medications Prior to Admission medications   Medication Sig Start Date End Date Taking? Authorizing Provider  albuterol  (VENTOLIN  HFA) 108 (90 Base) MCG/ACT inhaler TAKE 2 PUFFS BY MOUTH EVERY 6 HOURS AS NEEDED FOR WHEEZE OR SHORTNESS OF BREATH 04/19/22   Ezell Hollow, MD  aspirin  EC 81 MG tablet Take 81 mg by mouth daily. Swallow whole. Patient not taking: Reported on 12/24/2022    [provider]  Cholecalciferol  (VITAMIN D ) 125 MCG (5000 UT) CAPS Take 5,000 Units by mouth daily.    [provider]  Cyanocobalamin  (VITAMIN B12) 1000 MCG TBCR Take 1 tablet by mouth daily.    [provider]  dicyclomine  (BENTYL ) 20 MG tablet Take 1 tablet (20 mg total) by mouth 4 (four) times daily -  before meals and at bedtime. 01/13/22   Asencion Blacksmith, MD  donepezil  (ARICEPT ) 10 MG tablet Take 1 tablet (10 mg total) by mouth at bedtime. 10/28/22   Phebe Brasil, MD  HYDROcodone -acetaminophen  (NORCO)  10-325 MG tablet Take 1 tablet by mouth every 6 (six) hours as needed. 08/15/20   [provider]  loperamide  (IMODIUM ) 2 MG capsule Patient takes 2-4 tablets by mouth as needed for diarrhea or loose stools    [provider]  metoprolol  succinate (TOPROL -XL) 50 MG 24 hr tablet TAKE 1 TABLET BY MOUTH EVERY DAY WITH OR IMMEDIATELY FOLLOWING A MEAL 01/12/23   Hugh Madura, MD  NARCAN 4 MG/0.1ML LIQD nasal spray kit Place 1 spray into the nose once as needed (opioid od). Patient not taking: Reported on 10/29/2022 04/24/18   [provider]  nitroGLYCERIN  (NITROSTAT ) 0.4 MG SL tablet Place 1 tablet (0.4 mg total) under the tongue every 5 (five) minutes as needed for chest pain. 02/20/21 09/20/23  Marlyse Single T, PA-C  omeprazole  (PRILOSEC) 40 MG capsule Take 1 capsule (40 mg total) by mouth 2 (two) times daily. Patient not taking: Reported on 12/24/2022 01/13/22   Asencion Blacksmith, MD  ondansetron  (ZOFRAN ) 4 MG tablet Take 1 tablet (4 mg total) by mouth 2 (two) times daily as needed for nausea or vomiting. 03/21/23   Paz, Jose E, MD  pregabalin  (LYRICA ) 75 MG capsule Take 1 capsule (75 mg total) by mouth 3 (three) times daily as needed. 10/28/22   Phebe Brasil, MD  Propylene Glycol 0.6 % SOLN Apply 2 drops to eye as needed (for dry eyes).  [provider]  rOPINIRole  (REQUIP ) 1 MG tablet Take 1 tablet (1 mg total) by mouth 2 (two) times daily. 06/14/23   Ezell Hollow, MD  rosuvastatin  (CRESTOR ) 20 MG tablet TAKE 1 TABLET BY MOUTH EVERY DAY 06/13/23   Hugh Madura, MD  rosuvastatin  (CRESTOR ) 40 MG tablet Take 1 tablet (40 mg total) by mouth at bedtime. 05/25/23   Paz, Jose E, MD  sacubitril -valsartan  (ENTRESTO ) 97-103 MG TAKE 1 TABLET TWICE A DAY 05/13/23   Hugh Madura, MD  sertraline  (ZOLOFT ) 50 MG tablet Take 1.5 tablets (75 mg total) by mouth daily. 01/15/22   Ezell Hollow, MD  solifenacin  (VESICARE ) 5 MG tablet Take 1 tablet (5 mg total) by mouth daily. 07/02/22   Estill Hemming, DO  traZODone  (DESYREL ) 50 MG tablet Take 3 tablets (150 mg total) by mouth at bedtime as needed for sleep. 03/14/23   Ezell Hollow, MD      Allergies    Dextromethorphan-guaifenesin, Morphine, Gabapentin, and Penicillins    Review of Systems   Review of Systems  Constitutional:  Positive for appetite change, fatigue and fever.  Respiratory:  Positive for cough and shortness of breath. Negative for hemoptysis and sputum production.   Cardiovascular:  Negative for chest pain.  Gastrointestinal:  Positive for nausea. Negative for abdominal pain and vomiting.  Genitourinary:  Negative for dysuria.    Physical Exam Updated Vital Signs BP (!) 124/59   Pulse (!) 108   Temp 97.6 F (36.4 C) (Oral)   Resp 20   SpO2 94%  Physical Exam Vitals and nursing note reviewed.  Constitutional:      General: She is not in acute distress.    Appearance: Normal appearance. She is well-developed.  HENT:     Head: Normocephalic and atraumatic.  Eyes:     Conjunctiva/sclera: Conjunctivae normal.  Cardiovascular:     Rate and Rhythm: Normal rate and regular rhythm.     Heart sounds: No murmur heard. Pulmonary:     Effort: Pulmonary effort is normal. No respiratory distress.     Breath sounds: Normal breath sounds. No stridor. No wheezing.  Abdominal:     Palpations: Abdomen is soft.     Tenderness: There is no abdominal tenderness. There is no guarding or rebound.  Musculoskeletal:        General: No tenderness. Normal range of motion.     Cervical back: Neck supple.     Right lower leg: No edema.     Left lower leg: No edema.  Skin:    General: Skin is warm and dry.  Neurological:     General: No focal deficit present.     Mental Status: She is alert.     GCS: GCS eye subscore is 4. GCS verbal subscore is 5. GCS motor subscore is 6.     ED Results / Procedures / Treatments   Labs (all labs ordered are listed, but only abnormal results are displayed) Labs Reviewed  RESP  PANEL BY RT-PCR (RSV, FLU A&B, COVID)  RVPGX2 - Abnormal; Notable for the following components:      Result Value   SARS Coronavirus 2 by RT PCR POSITIVE (*)    All other components within normal limits  BASIC METABOLIC PANEL WITH GFR - Abnormal; Notable for the following components:   Potassium 3.4 (*)    CO2 19 (*)    Glucose, Bld 121 (*)    Creatinine, Ser 1.05 (*)  GFR, Estimated 54 (*)    Anion gap 18 (*)    All other components within normal limits  PRO BRAIN NATRIURETIC PEPTIDE - Abnormal; Notable for the following components:   Pro Brain Natriuretic Peptide 483.0 (*)    All other components within normal limits  TROPONIN T, HIGH SENSITIVITY - Abnormal; Notable for the following components:   Troponin T High Sensitivity 27 (*)    All other components within normal limits  TROPONIN T, HIGH SENSITIVITY - Abnormal; Notable for the following components:   Troponin T High Sensitivity 26 (*)    All other components within normal limits  CBC    EKG None  Radiology CT Angio Chest PE W/Cm &/Or Wo Cm Result Date: 06/16/2023 CLINICAL DATA:  Pulmonary embolism (PE) suspected, high prob, acute cough, decreased appetite, COVID positive EXAM: CT ANGIOGRAPHY CHEST WITH CONTRAST TECHNIQUE: Multidetector CT imaging of the chest was performed using the standard protocol during bolus administration of intravenous contrast. Multiplanar CT image reconstructions and MIPs were obtained to evaluate the vascular anatomy. RADIATION DOSE REDUCTION: This exam was performed according to the departmental dose-optimization program which includes automated exposure control, adjustment of the mA and/or kV according to patient size and/or use of iterative reconstruction technique. CONTRAST:  OMNIPAQUE  IOHEXOL  350 MG/ML SOLN COMPARISON:  06/16/2023 chest x-ray FINDINGS: Cardiovascular: Central pulmonary arteries are well visualized and appear patent. No significant filling defect or acute pulmonary embolus by  CTA. Aortic and native coronary atherosclerosis noted. Patent 3 vessel arch anatomy. No acute aortic process, aneurysm or dissection. No mediastinal hemorrhage or hematoma. Borderline cardiomegaly. No pericardial effusion. Central venous structures are patent. Mediastinum/Nodes: No enlarged mediastinal, hilar, or axillary lymph nodes. Thyroid  gland, trachea, and esophagus demonstrate no significant findings. Lungs/Pleura: No acute airspace process, significant collapse or consolidation. No focal pneumonia. Left fissural thickening noted, nonspecific. Minor inferior lingula and dependent bibasilar atelectasis. Trachea and central airways are patent. No pleural abnormality effusion, or pneumothorax. Upper Abdomen: Small hiatal hernia. Abdominal atherosclerosis present. No acute finding. Musculoskeletal: Degenerative changes throughout the spine. No acute osseous finding. No compression fracture. Intact sternum. Review of the MIP images confirms the above findings. IMPRESSION: 1. No acute pulmonary embolus by CTA. 2. No acute intrathoracic finding. 3. Aortic and native coronary atherosclerosis. 4. Small hiatal hernia. Aortic Atherosclerosis (ICD10-I70.0). Electronically Signed   By: Melven Stable.  Shick M.D.   On: 06/16/2023 21:16   DG Chest 2 View Result Date: 06/16/2023 CLINICAL DATA:  Shortness of breath and cough EXAM: CHEST - 2 VIEW COMPARISON:  06/24/2022 FINDINGS: The heart size and mediastinal contours are within normal limits. Both lungs are clear. The visualized skeletal structures are unremarkable. IMPRESSION: No active cardiopulmonary disease. Electronically Signed   By: Violeta Grey M.D.   On: 06/16/2023 19:10    Procedures Procedures    Medications Ordered in ED Medications - No data to display  ED Course/ Medical Decision Making/ A&P Clinical Course as of 06/17/23 1003  Thu Jun 16, 2023  1809 EKG not crossing in epic.  Normal sinus rhythm right bundle branch block no significant change from prior  3/24 [MB]  1834 Chest x-ray does not show any acute infiltrate.  Awaiting radiology reading.  COVID test came back positive for COVID. [MB]  2041 Reviewed her home medications with pharmacy at [facility].  They said the only relative contraindication was the statin and she just needs to hold this medication while she is on Paxlovid.  [MB]  2123 Patient's son tells me she  has been on Paxlovid since last night but because she was not feeling good today they decided to bring her here to make sure she did not have pneumonia.  Recommended continuing the Paxlovid and following up with PCP. [MB]    Clinical Course User Index [MB] Tonya Fredrickson, MD                                 Medical Decision Making Amount and/or Complexity of Data Reviewed Labs: ordered. Radiology: ordered.  Risk Prescription drug management.   This patient complains of cough and shortness of breath weakness; this involves an extensive number of treatment Options and is a complaint that carries with it a high risk of complications and morbidity. The differential includes pneumonia, COPD, CHF, COVID, flu, pneumothorax  I ordered, reviewed and interpreted labs, which included CBC normal chemistries with mildly low potassium low bicarb elevated creatinine, BNP elevated no priors to compare with, troponins mildly elevated, COVID-positive I ordered imaging studies which included chest angio and I independently    visualized and interpreted imaging which showed no acute findings Additional history obtained from patient's son Previous records obtained and reviewed in epic including recent PCP notes Cardiac monitoring reviewed, normal sinus rhythm Social determinants considered, depression and social isolation Critical Interventions: None  After the interventions stated above, I reevaluated the patient and found patient to be oxygenating well on room air in no distress Admission and further testing considered, no indications for  admission.  She is already on Paxlovid.  Recommended close follow-up with PCP and return instructions discussed.         Final Clinical Impression(s) / ED Diagnoses Final diagnoses:  COVID-19 virus infection    Rx / DC Orders ED Discharge Orders     None         Tonya Fredrickson, MD 06/17/23 1005

## 2023-06-16 NOTE — ED Triage Notes (Signed)
 Pt presents with cough and sneezing with poor appetite and poor po intake x 3 days.  Called her physicians office and were told to come to the ED to rule out possible PNA

## 2023-06-17 ENCOUNTER — Ambulatory Visit: Admitting: Internal Medicine

## 2023-06-17 ENCOUNTER — Encounter (HOSPITAL_COMMUNITY): Payer: Self-pay

## 2023-06-24 ENCOUNTER — Encounter: Payer: Self-pay | Admitting: Internal Medicine

## 2023-06-24 ENCOUNTER — Ambulatory Visit: Admitting: Internal Medicine

## 2023-06-24 VITALS — BP 130/70 | HR 74 | Temp 98.1°F | Resp 20 | Ht 66.0 in | Wt 243.4 lb

## 2023-06-24 DIAGNOSIS — I10 Essential (primary) hypertension: Secondary | ICD-10-CM

## 2023-06-24 DIAGNOSIS — F32A Depression, unspecified: Secondary | ICD-10-CM | POA: Diagnosis not present

## 2023-06-24 DIAGNOSIS — R5382 Chronic fatigue, unspecified: Secondary | ICD-10-CM

## 2023-06-24 DIAGNOSIS — F419 Anxiety disorder, unspecified: Secondary | ICD-10-CM | POA: Diagnosis not present

## 2023-06-24 DIAGNOSIS — I428 Other cardiomyopathies: Secondary | ICD-10-CM

## 2023-06-24 LAB — BASIC METABOLIC PANEL WITH GFR
BUN: 11 mg/dL (ref 6–23)
CO2: 27 meq/L (ref 19–32)
Calcium: 8.8 mg/dL (ref 8.4–10.5)
Chloride: 106 meq/L (ref 96–112)
Creatinine, Ser: 0.88 mg/dL (ref 0.40–1.20)
GFR: 62.81 mL/min (ref >60.00)
Glucose, Bld: 115 mg/dL — ABNORMAL HIGH (ref 70–99)
Potassium: 4.2 meq/L (ref 3.5–5.1)
Sodium: 141 meq/L (ref 135–145)

## 2023-06-24 NOTE — Patient Instructions (Addendum)
 Recommend to take aspirin  81 mg daily  The neurologist has recommended memory medication, is called Aricept .   Check the  blood pressure regularly Blood pressure goal:  between 110/65 and  135/85. If it is consistently higher or lower, let me know     GO TO THE LAB : Get the blood work     Next office visit for a checkup in 3 months Please make an appointment before you leave today

## 2023-06-24 NOTE — Progress Notes (Signed)
 Subjective:    Patient ID: [name], female    DOB: October 23, 1945, 78 y.o.   MRN: [id number]  DOS:  06/24/2023 Type of visit - description: ER follow-up, here with her son  Shelah Derry to the ER 06/16/2023: Tested positive for COVID. CT angio chest: No PE, aortic and coronary atherosclerosis. Chest x-ray negative Troponin slightly elevated at 27 (normal less than 19) BNP 483 elevated CBC normal. Potassium 3.4, creatinine 1.0.   The patient took Paxlovid. At this point denies fever. Cough is improved, still has occasional white sputum. Had nausea: Resolved Has chronic diarrhea: Unchanged. Still much more fatigued than usual.  Review of Systems See above   Past Medical History:  Diagnosis Date   Allergy    Anemia    Anxiety    Barrett's esophagus    Bilateral carpal tunnel syndrome    Dr. Donavon Fudge, having injection therapy   C. difficile diarrhea 08/01/2012   severe 2014   Carotid arterial disease (HCC)    Cataract    removed both eyes    Chronic combined systolic and diastolic CHF (congestive heart failure) (HCC)    Chronic cystitis    Dr. Clarke Crouch   Chronic lower back pain    Chronic pain syndrome    Depression    Dizziness    DJD (degenerative joint disease)    bilateral hands; knees   Family history of anesthesia complication    Mother had severe N/V   Fibromyalgia    GERD (gastroesophageal reflux disease)    H/O cardiac catheterization    (-) cath 12-2002  , cath again 2011 (-)   History of colon polyps    History of nuclear stress test    Myoview  3/23: EF 51, no ischemia; low risk (echocardiogram in 11/22 with normal EF)   HTN (hypertension)    Hyperlipidemia    past hx    IBS (irritable bowel syndrome)    Insomnia    Migraine    Morbid obesity (HCC)    Non-ischemic cardiomyopathy (HCC)    Osteoporosis    pt unsure of this   RLS (restless legs syndrome)    Vertigo    Vitamin B 12 deficiency 04/09/2013    Past Surgical History:  Procedure  Laterality Date   Arm surgery Left    "don't remember what they did; arm wasn't broken"   BILATERAL KNEE ARTHROSCOPY Bilateral    BIOPSY  08/07/2018   Procedure: BIOPSY;  Surgeon: Asencion Blacksmith, MD;  Location: [facility];  Service: Endoscopy;;   CARDIAC CATHETERIZATION  01/22/10   clean cath   CATARACT EXTRACTION W/ INTRAOCULAR LENS  IMPLANT, BILATERAL Bilateral    CHOLECYSTECTOMY N/A 10/25/2016   Procedure: LAPAROSCOPIC CHOLECYSTECTOMY;  Surgeon: Dorena Gander, MD;  Location: [facility];  Service: General;  Laterality: N/A;   COLONOSCOPY     COLONOSCOPY W/ POLYPECTOMY     ESOPHAGOGASTRODUODENOSCOPY (EGD) WITH PROPOFOL  N/A 08/07/2018   Procedure: ESOPHAGOGASTRODUODENOSCOPY (EGD) WITH PROPOFOL ;  Surgeon: Asencion Blacksmith, MD;  Location: [facility];  Service: Endoscopy;  Laterality: N/A;   FOOT SURGERY Bilateral    toenails removed; callus removed on right; hammertoes right"   Knot     "removed from right neck; not a goiter"   LUMBAR DISC SURGERY     L5 S1 anterior fusion   OOPHORECTOMY     RIGHT/LEFT HEART CATH AND CORONARY ANGIOGRAPHY N/A 05/06/2017   Procedure: RIGHT/LEFT HEART CATH AND CORONARY ANGIOGRAPHY;  Surgeon: Sammy Crisp, MD;  Location: [facility];  Service: Cardiovascular;  Laterality: N/A;   SHOULDER ARTHROSCOPY Right    TOTAL KNEE ARTHROPLASTY  10/05/2011   Procedure: TOTAL KNEE ARTHROPLASTY;  Surgeon: Alphonzo Ask, MD;  Location: [facility] OR;  Service: Orthopedics;  Laterality: Right;   UPPER GASTROINTESTINAL ENDOSCOPY     VAGINAL HYSTERECTOMY     for endometriosis    Current Outpatient Medications  Medication Instructions   albuterol  (VENTOLIN  HFA) 108 (90 Base) MCG/ACT inhaler TAKE 2 PUFFS BY MOUTH EVERY 6 HOURS AS NEEDED FOR WHEEZE OR SHORTNESS OF BREATH   aspirin  EC 81 mg, Daily   Cyanocobalamin  (VITAMIN B12) 1000 MCG TBCR 1 tablet, Daily   dicyclomine  (BENTYL ) 20 mg, Oral, 3 times daily before meals & bedtime   donepezil  (ARICEPT ) 10 mg, Oral, Daily  at bedtime   HYDROcodone -acetaminophen  (NORCO) 10-325 MG tablet 1 tablet, Every 6 hours PRN   loperamide  (IMODIUM ) 2 MG capsule Patient takes 2-4 tablets by mouth as needed for diarrhea or loose stools   metoprolol  succinate (TOPROL -XL) 50 MG 24 hr tablet TAKE 1 TABLET BY MOUTH EVERY DAY WITH OR IMMEDIATELY FOLLOWING A MEAL   NARCAN 4 MG/0.1ML LIQD nasal spray kit 1 spray, Once PRN   nitroGLYCERIN  (NITROSTAT ) 0.4 mg, Sublingual, Every 5 min PRN   omeprazole  (PRILOSEC) 40 mg, Oral, 2 times daily   ondansetron  (ZOFRAN ) 4 mg, Oral, 2 times daily PRN   pregabalin  (LYRICA ) 75 mg, Oral, 3 times daily PRN   Propylene Glycol 0.6 % SOLN 2 drops, As needed   rOPINIRole  (REQUIP ) 1 mg, Oral, 2 times daily   rosuvastatin  (CRESTOR ) 40 mg, Oral, Daily at bedtime   rosuvastatin  (CRESTOR ) 20 mg, Oral, Daily   sacubitril -valsartan  (ENTRESTO ) 97-103 MG 1 tablet, Oral, 2 times daily   sertraline  (ZOLOFT ) 75 mg, Oral, Daily   solifenacin  (VESICARE ) 5 mg, Oral, Daily   traZODone  (DESYREL ) 150 mg, Oral, At bedtime PRN   Vitamin D  5,000 Units, Daily       Objective:   Physical Exam BP 130/70 (BP Location: Left Arm, Patient Position: Sitting, Cuff Size: Large)   Pulse 74   Temp 98.1 F (36.7 C) (Oral)   Resp 20   Ht 5\' 6"  (1.676 m)   Wt 243 lb 6.4 oz (110.4 kg)   SpO2 98%   BMI 39.29 kg/m  General:   Well developed, NAD, BMI noted. HEENT:  Normocephalic . Face symmetric, atraumatic Lungs:  CTA B Normal respiratory effort, no intercostal retractions, no accessory muscle use. Heart: RRR,  no murmur.  Lower extremities: no pretibial edema bilaterally  Skin: Not pale. Not jaundice Neurologic:  alert & oriented X3.  Speech normal, gait appropriate for age and assisted by a cane Psych--  Cognition and judgment appear intact.  Cooperative with normal attention span and concentration.  Behavior appropriate. No anxious or depressed appearing.      Assessment   Assessment   HTN Hyperlipidemia-   Pravachol , : Myalgias. Intolerant to Zetia  (joint aches), see OV  06/2016  Depression, Anxiety (on clonazepam ), insomnia (on trazodone ):  depression x many  years, remotely on zoloft  and other meds per psych, I rx lexapro  2015, then was rx effexor  x a while Morbid obesity CV: CHF/ non ischemic cardiomyopathy dx 04-2017; cath normal coronaries, EF ~ 30% Carotid artery disease: 1 to 39% bilateral carotids 09-2016, start aspirin  per neurology GI:  --GERD, Barrett's esophagus, had a EGD 07/2018  --h/o persistent C. difficile diarrhea 2014 --Cholecystectomy B12 deficiency Osteopenia: Dexa 2015 showed osteopenia, T  score -1.9 (10/2016): tscore 2021 - 1.8; Rx cs, vit D PULM: --NPSG 2008:  No osa, PLMS 4/hr with arousal and awakening --Home sleep study 11-2020 mild sleep apnea, further advised per pulmonary. --RLS - Requip  --Has albuterol , prn Migraines, f/u [facility] -- off topamax  as off 06-2016 MSK: --Back pain, chronic: pain meds rx by ortho, Workers comp MD @ [employer] pain mngmt [place] --DJD,CTS B ([facility]) --Fibromyalgia.  See note from 02/14/2018 [facility], LUTS-- on myrbetriq  Dr McDarmoth  Raynaud phenomena---- DX 07/2017 COVID-19, pulmonary emboli: Monoclonal infusion 11/13/2019.  anticoag until 08-2020.  PLAN COVID-19: Dx at the ER 06/16/2023, Rx Paxlovid.  Overall is improving, reports she is extremely fatigued, more than baseline.  Anticipate she will go back to her baseline within few weeks.   Nonischemic cardiomyopathy: At the ER, had a day elevated BNP, no baseline.  Today with no obvious volume overload. LOV cardiology 12/24/2022, EF noted to be improved to 55 to 60% on last echo Did report chest pain and fatigue at the time.  No further workup was rec, next visit 1 year Potassium was slightly low at the ER, recheck today. HTN: Well-controlled History of a stroke: See previous entries, not taking aspirin , encouraged to do Cognitive impairment: Was Rx Aricept  by neurology, not  taking it.  Recommend to start if so desired. Depression anxiety insomnia: Unchanged/stable RTC 3 months

## 2023-06-25 NOTE — Assessment & Plan Note (Signed)
 COVID-19: Dx at the ER 06/16/2023, Rx Paxlovid.  Overall is improving, reports she is extremely fatigued, more than baseline.  Anticipate she will go back to her baseline within few weeks.   Nonischemic cardiomyopathy: At the ER, had a day elevated BNP, no baseline.  Today with no obvious volume overload. LOV cardiology 12/24/2022, EF noted to be improved to 55 to 60% on last echo Did report chest pain and fatigue at the time.  No further workup was rec, next visit 1 year Potassium was slightly low at the ER, recheck today. HTN: Well-controlled History of a stroke: See previous entries, not taking aspirin , encouraged to do Cognitive impairment: Was Rx Aricept  by neurology, not taking it.  Recommend to start if so desired. Depression anxiety insomnia: Unchanged/stable RTC 3 months

## 2023-06-27 ENCOUNTER — Ambulatory Visit: Payer: Self-pay | Admitting: Internal Medicine

## 2023-07-01 DIAGNOSIS — K2970 Gastritis, unspecified, without bleeding: Secondary | ICD-10-CM | POA: Diagnosis not present

## 2023-07-01 DIAGNOSIS — R112 Nausea with vomiting, unspecified: Secondary | ICD-10-CM | POA: Diagnosis not present

## 2023-07-01 DIAGNOSIS — K580 Irritable bowel syndrome with diarrhea: Secondary | ICD-10-CM | POA: Diagnosis not present

## 2023-07-19 ENCOUNTER — Other Ambulatory Visit: Payer: Self-pay | Admitting: Internal Medicine

## 2023-07-19 NOTE — Telephone Encounter (Signed)
 40 mg daily.  Thank you

## 2023-07-19 NOTE — Telephone Encounter (Signed)
 What dose of rosuvastatin  should Pt be on?

## 2023-07-20 NOTE — Telephone Encounter (Signed)
 Dr. Renna Cary sent in 20mg  on 06/13/23.

## 2023-07-20 NOTE — Telephone Encounter (Signed)
 Dr. Renna Cary, could you clarify rosuvastatin  dose, I was under the impression her dose is 40 mg daily. Thank you

## 2023-07-31 DIAGNOSIS — R350 Frequency of micturition: Secondary | ICD-10-CM | POA: Diagnosis not present

## 2023-07-31 DIAGNOSIS — R300 Dysuria: Secondary | ICD-10-CM | POA: Diagnosis not present

## 2023-08-11 ENCOUNTER — Ambulatory Visit: Admitting: Physician Assistant

## 2023-08-11 ENCOUNTER — Encounter: Payer: Self-pay | Admitting: Physician Assistant

## 2023-08-11 VITALS — BP 114/72 | HR 79 | Temp 98.1°F | Resp 17 | Ht 66.0 in | Wt 254.2 lb

## 2023-08-11 DIAGNOSIS — R3915 Urgency of urination: Secondary | ICD-10-CM

## 2023-08-11 DIAGNOSIS — N3941 Urge incontinence: Secondary | ICD-10-CM

## 2023-08-11 LAB — POCT URINALYSIS DIP (MANUAL ENTRY)
Blood, UA: NEGATIVE
Glucose, UA: NEGATIVE mg/dL
Ketones, POC UA: NEGATIVE mg/dL
Leukocytes, UA: NEGATIVE
Nitrite, UA: NEGATIVE
Protein Ur, POC: 30 mg/dL — AB
Spec Grav, UA: >=1.03 — AB (ref 1.010–1.025)
Urobilinogen, UA: 0.2 U/dL
pH, UA: 5 (ref 5.0–8.0)

## 2023-08-11 NOTE — Progress Notes (Signed)
 Established patient visit   Patient: [name]   DOB: May 08, 1945   78 y.o. Female  MRN: [id number] Visit Date: 08/11/2023  Today's healthcare provider: Manuelita Flatness, PA-C   Chief Complaint  Patient presents with   Urinary Frequency    Urinary incontnence says it dosnt hurt when she urinates but has the constant urge to go   Subjective     Pt presents today with family. They report that she was seen at an urgent care last week, tx for a UTI.   Reports continued frequency, urgency. Denies dysuria. She reports new nighttime incontinence x 3-4 weeks. Incontinence is unchanged from abx use. Last dose of keflex  500 mg bid was yesterday.   Medications: Outpatient Medications Prior to Visit  Medication Sig   albuterol  (VENTOLIN  HFA) 108 (90 Base) MCG/ACT inhaler TAKE 2 PUFFS BY MOUTH EVERY 6 HOURS AS NEEDED FOR WHEEZE OR SHORTNESS OF BREATH   aspirin  EC 81 MG tablet Take 81 mg by mouth daily. Swallow whole.   Cholecalciferol  (VITAMIN D ) 125 MCG (5000 UT) CAPS Take 5,000 Units by mouth daily.   Cyanocobalamin  (VITAMIN B12) 1000 MCG TBCR Take 1 tablet by mouth daily.   dicyclomine  (BENTYL ) 20 MG tablet Take 1 tablet (20 mg total) by mouth 4 (four) times daily -  before meals and at bedtime.   donepezil  (ARICEPT ) 10 MG tablet Take 1 tablet (10 mg total) by mouth at bedtime.   HYDROcodone -acetaminophen  (NORCO) 10-325 MG tablet Take 1 tablet by mouth every 6 (six) hours as needed.   loperamide  (IMODIUM ) 2 MG capsule Patient takes 2-4 tablets by mouth as needed for diarrhea or loose stools   metoprolol  succinate (TOPROL -XL) 50 MG 24 hr tablet TAKE 1 TABLET BY MOUTH EVERY DAY WITH OR IMMEDIATELY FOLLOWING A MEAL   NARCAN 4 MG/0.1ML LIQD nasal spray kit Place 1 spray into the nose once as needed (opioid od).   nitroGLYCERIN  (NITROSTAT ) 0.4 MG SL tablet Place 1 tablet (0.4 mg total) under the tongue every 5 (five) minutes as needed for chest pain.   omeprazole  (PRILOSEC) 40 MG capsule  Take 1 capsule (40 mg total) by mouth 2 (two) times daily.   ondansetron  (ZOFRAN ) 4 MG tablet Take 1 tablet (4 mg total) by mouth 2 (two) times daily as needed for nausea or vomiting.   pregabalin  (LYRICA ) 75 MG capsule Take 1 capsule (75 mg total) by mouth 3 (three) times daily as needed.   Propylene Glycol 0.6 % SOLN Apply 2 drops to eye as needed (for dry eyes).   rOPINIRole  (REQUIP ) 1 MG tablet Take 1 tablet (1 mg total) by mouth 2 (two) times daily.   rosuvastatin  (CRESTOR ) 40 MG tablet Take 1 tablet (40 mg total) by mouth at bedtime.   sacubitril -valsartan  (ENTRESTO ) 97-103 MG TAKE 1 TABLET TWICE A DAY   sertraline  (ZOLOFT ) 50 MG tablet Take 1.5 tablets (75 mg total) by mouth daily.   solifenacin  (VESICARE ) 5 MG tablet Take 1 tablet (5 mg total) by mouth daily.   traZODone  (DESYREL ) 50 MG tablet Take 3 tablets (150 mg total) by mouth at bedtime as needed for sleep.   No facility-administered medications prior to visit.    Review of Systems  Constitutional:  Negative for fatigue and fever.  Respiratory:  Negative for cough and shortness of breath.   Cardiovascular:  Negative for chest pain and leg swelling.  Gastrointestinal:  Negative for abdominal pain.  Neurological:  Negative for dizziness and headaches.  Objective    BP 114/72 (BP Location: Left Arm, Patient Position: Sitting, Cuff Size: Normal)   Pulse 79   Temp 98.1 F (36.7 C) (Oral)   Resp 17   Ht 5' 6 (1.676 m)   Wt 254 lb 3.2 oz (115.3 kg)   SpO2 98%   BMI 41.03 kg/m    Physical Exam Constitutional:      General: She is awake.     Appearance: She is well-developed.  HENT:     Head: Normocephalic.  Eyes:     Conjunctiva/sclera: Conjunctivae normal.  Cardiovascular:     Rate and Rhythm: Normal rate and regular rhythm.     Heart sounds: Normal heart sounds.  Pulmonary:     Effort: Pulmonary effort is normal.     Breath sounds: Normal breath sounds.  Abdominal:     Palpations: Abdomen is soft.      Tenderness: There is no abdominal tenderness. There is no guarding.  Skin:    General: Skin is warm.  Neurological:     Mental Status: She is alert and oriented to person, place, and time.  Psychiatric:        Attention and Perception: Attention normal.        Mood and Affect: Mood normal.        Speech: Speech normal.        Behavior: Behavior is cooperative.    Results for orders placed or performed in visit on 08/11/23  POCT urinalysis dipstick  Result Value Ref Range   Color, UA yellow yellow   Clarity, UA clear clear   Glucose, UA negative negative mg/dL   Bilirubin, UA moderate (A) negative   Ketones, POC UA negative negative mg/dL   Spec Grav, UA >=8.969 (A) 1.010 - 1.025   Blood, UA negative negative   pH, UA 5.0 5.0 - 8.0   Protein Ur, POC =30 (A) negative mg/dL   Urobilinogen, UA 0.2 0.2 or 1.0 E.U./dL   Nitrite, UA Negative Negative   Leukocytes, UA Negative Negative    Assessment & Plan    Urge incontinence of urine -     Ambulatory referral to Urology  Urinary urgency -     POCT urinalysis dipstick  UA negative. Will attempt to get culture results from urgent care. Pt reports new incontinence but I do see the dx on her chart historically  Recommend uro referral  Return if symptoms worsen or fail to improve.       Manuelita Flatness, PA-C  [facility] [phone] (phone) [phone] (fax)  [facility]

## 2023-08-15 ENCOUNTER — Other Ambulatory Visit: Payer: Self-pay | Admitting: Internal Medicine

## 2023-09-07 ENCOUNTER — Ambulatory Visit (INDEPENDENT_AMBULATORY_CARE_PROVIDER_SITE_OTHER)

## 2023-09-07 VITALS — BP 114/72 | Ht 61.0 in | Wt 250.0 lb

## 2023-09-07 DIAGNOSIS — Z0000 Encounter for general adult medical examination without abnormal findings: Secondary | ICD-10-CM | POA: Diagnosis not present

## 2023-09-07 DIAGNOSIS — Z2821 Immunization not carried out because of patient refusal: Secondary | ICD-10-CM

## 2023-09-07 NOTE — Progress Notes (Signed)
 Because this visit was a virtual/telehealth visit,  certain criteria was not obtained, such a blood pressure, CBG if applicable, and timed get up and go. Any medications not marked as taking were not mentioned during the medication reconciliation part of the visit. Any vitals not documented were not able to be obtained due to this being a telehealth visit or patient was unable to self-report a recent blood pressure reading due to a lack of equipment at home via telehealth. Vitals that have been documented are verbally provided by the patient.  This visit was performed by a medical professional under my direct supervision. I was immediately available for consultation/collaboration. I have reviewed and agree with the Annual Wellness Visit documentation.  Subjective:   [name] is a 78 y.o. who presents for a Medicare Wellness preventive visit.  As a reminder, Annual Wellness Visits don't include a physical exam, and some assessments may be limited, especially if this visit is performed virtually. We may recommend an in-person follow-up visit with your provider if needed.  Visit Complete: Virtual I connected with  [name] on 09/07/23 by a audio enabled telemedicine application and verified that I am speaking with the correct person using two identifiers.  Patient Location: Home  Provider Location: Home Office  I discussed the limitations of evaluation and management by telemedicine. The patient expressed understanding and agreed to proceed.  Vital Signs: Because this visit was a virtual/telehealth visit, some criteria may be missing or patient reported. Any vitals not documented were not able to be obtained and vitals that have been documented are patient reported.  VideoDeclined- This patient declined Librarian, academic. Therefore the visit was completed with audio only.  Persons Participating in Visit: Patient.  AWV Questionnaire: No: Patient Medicare  AWV questionnaire was not completed prior to this visit.  Cardiac Risk Factors include: advanced age (>32men, >16 women);obesity (BMI >30kg/m2)     Objective:    Today's Vitals   09/07/23 1549  BP: 114/72  Weight: 250 lb (113.4 kg)  Height: 5' 1 (1.549 m)  PainSc: 10-Worst pain ever   Body mass index is 47.24 kg/m.     09/07/2023    3:48 PM 06/16/2023    5:39 PM 06/24/2022   10:11 AM 11/09/2021    1:01 PM 02/08/2021    1:56 PM 10/11/2020    9:09 AM 12/07/2019    6:45 PM  Advanced Directives  Does Patient Have a Medical Advance Directive? No No No Yes No Yes Yes  Type of Theme park manager;Living will  Healthcare Power of [place];Living will Healthcare Power of [place];Living will  Does patient want to make changes to medical advance directive?       No - Patient declined  Copy of Healthcare Power of Attorney in Chart?    No - copy requested  No - copy requested No - copy requested  Would patient like information on creating a medical advance directive? No - Patient declined  No - Patient declined        Current Medications (verified) Outpatient Encounter Medications as of 09/07/2023  Medication Sig   albuterol  (VENTOLIN  HFA) 108 (90 Base) MCG/ACT inhaler TAKE 2 PUFFS BY MOUTH EVERY 6 HOURS AS NEEDED FOR WHEEZE OR SHORTNESS OF BREATH   aspirin  EC 81 MG tablet Take 81 mg by mouth daily. Swallow whole.   Cholecalciferol  (VITAMIN D ) 125 MCG (5000 UT) CAPS Take 5,000 Units by mouth daily.  Cyanocobalamin  (VITAMIN B12) 1000 MCG TBCR Take 1 tablet by mouth daily.   dicyclomine  (BENTYL ) 20 MG tablet Take 1 tablet (20 mg total) by mouth 4 (four) times daily -  before meals and at bedtime.   donepezil  (ARICEPT ) 10 MG tablet Take 1 tablet (10 mg total) by mouth at bedtime.   HYDROcodone -acetaminophen  (NORCO) 10-325 MG tablet Take 1 tablet by mouth every 6 (six) hours as needed.   loperamide  (IMODIUM ) 2 MG capsule Patient takes 2-4 tablets by mouth as needed  for diarrhea or loose stools   metoprolol  succinate (TOPROL -XL) 50 MG 24 hr tablet TAKE 1 TABLET BY MOUTH EVERY DAY WITH OR IMMEDIATELY FOLLOWING A MEAL   NARCAN 4 MG/0.1ML LIQD nasal spray kit Place 1 spray into the nose once as needed (opioid od).   nitroGLYCERIN  (NITROSTAT ) 0.4 MG SL tablet Place 1 tablet (0.4 mg total) under the tongue every 5 (five) minutes as needed for chest pain.   omeprazole  (PRILOSEC) 40 MG capsule Take 1 capsule (40 mg total) by mouth 2 (two) times daily.   ondansetron  (ZOFRAN ) 4 MG tablet Take 1 tablet (4 mg total) by mouth 2 (two) times daily as needed for nausea or vomiting.   pregabalin  (LYRICA ) 75 MG capsule Take 1 capsule (75 mg total) by mouth 3 (three) times daily as needed.   Propylene Glycol 0.6 % SOLN Apply 2 drops to eye as needed (for dry eyes).   rOPINIRole  (REQUIP ) 1 MG tablet Take 1 tablet (1 mg total) by mouth 2 (two) times daily.   rosuvastatin  (CRESTOR ) 40 MG tablet Take 1 tablet (40 mg total) by mouth at bedtime.   sacubitril -valsartan  (ENTRESTO ) 97-103 MG TAKE 1 TABLET TWICE A DAY   sertraline  (ZOLOFT ) 50 MG tablet Take 1.5 tablets (75 mg total) by mouth daily.   solifenacin  (VESICARE ) 5 MG tablet Take 1 tablet (5 mg total) by mouth daily.   traZODone  (DESYREL ) 50 MG tablet Take 3 tablets (150 mg total) by mouth at bedtime as needed for sleep.   No facility-administered encounter medications on file as of 09/07/2023.    Allergies (verified) Dextromethorphan-guaifenesin, Morphine, Gabapentin, and Penicillins   History: Past Medical History:  Diagnosis Date   Allergy    Anemia    Anxiety    Barrett's esophagus    Bilateral carpal tunnel syndrome    Dr. Deward, having injection therapy   C. difficile diarrhea 08/01/2012   severe 2014   Carotid arterial disease (HCC)    Cataract    removed both eyes    Chronic combined systolic and diastolic CHF (congestive heart failure) (HCC)    Chronic cystitis    Dr. Gaston   Chronic lower back  pain    Chronic pain syndrome    Depression    Dizziness    DJD (degenerative joint disease)    bilateral hands; knees   Family history of anesthesia complication    Mother had severe N/V   Fibromyalgia    GERD (gastroesophageal reflux disease)    H/O cardiac catheterization    (-) cath 12-2002  , cath again 2011 (-)   History of colon polyps    History of nuclear stress test    Myoview  3/23: EF 51, no ischemia; low risk (echocardiogram in 11/22 with normal EF)   HTN (hypertension)    Hyperlipidemia    past hx    IBS (irritable bowel syndrome)    Insomnia    Migraine    Morbid obesity (HCC)  Non-ischemic cardiomyopathy (HCC)    Osteoporosis    pt unsure of this   RLS (restless legs syndrome)    Vertigo    Vitamin B 12 deficiency 04/09/2013   Past Surgical History:  Procedure Laterality Date   Arm surgery Left    don't remember what they did; arm wasn't broken   BILATERAL KNEE ARTHROSCOPY Bilateral    BIOPSY  08/07/2018   Procedure: BIOPSY;  Surgeon: Aneita Gwendlyn DASEN, MD;  Location: [facility];  Service: Endoscopy;;   CARDIAC CATHETERIZATION  01/22/10   clean cath   CATARACT EXTRACTION W/ INTRAOCULAR LENS  IMPLANT, BILATERAL Bilateral    CHOLECYSTECTOMY N/A 10/25/2016   Procedure: LAPAROSCOPIC CHOLECYSTECTOMY;  Surgeon: Sebastian Moles, MD;  [facility];  Service: General;  Laterality: N/A;   COLONOSCOPY     COLONOSCOPY W/ POLYPECTOMY     ESOPHAGOGASTRODUODENOSCOPY (EGD) WITH PROPOFOL  N/A 08/07/2018   Procedure: ESOPHAGOGASTRODUODENOSCOPY (EGD) WITH PROPOFOL ;  Surgeon: Aneita Gwendlyn DASEN, MD;  Location: [facility];  Service: Endoscopy;  Laterality: N/A;   FOOT SURGERY Bilateral    toenails removed; callus removed on right; hammertoes right   Knot     removed from right neck; not a goiter   LUMBAR DISC SURGERY     L5 S1 anterior fusion   OOPHORECTOMY     RIGHT/LEFT HEART CATH AND CORONARY ANGIOGRAPHY N/A 05/06/2017   Procedure: RIGHT/LEFT HEART CATH AND  CORONARY ANGIOGRAPHY;  Surgeon: Mady Bruckner, MD;  Location: [facility];  Service: Cardiovascular;  Laterality: N/A;   SHOULDER ARTHROSCOPY Right    TOTAL KNEE ARTHROPLASTY  10/05/2011   Procedure: TOTAL KNEE ARTHROPLASTY;  Surgeon: Maude KANDICE Herald, MD;  Location: [facility] OR;  Service: Orthopedics;  Laterality: Right;   UPPER GASTROINTESTINAL ENDOSCOPY     VAGINAL HYSTERECTOMY     for endometriosis   Family History  Problem Relation Age of Onset   Lung cancer Mother    Dementia Father    CAD Father        dx in his 68s   Stroke Father    Congestive Heart Failure Father    Colon cancer Maternal Grandfather    Esophageal cancer Brother    Breast cancer Sister    Diabetes Other        Aunt   Colon cancer Maternal Uncle        2   Hepatitis C Brother    Rectal cancer Neg Hx    Stomach cancer Neg Hx    Colon polyps Neg Hx    Social History   Socioeconomic History   Marital status: Divorced    Spouse name: Not on file   Number of children: 2   Years of education: 12   Highest education level: 12th grade  Occupational History   Occupation: Retired, post Sales promotion account executive: RETIRED  Tobacco Use   Smoking status: Former    Current packs/day: 0.00    Average packs/day: 0.5 packs/day for 10.0 years (5.0 ttl pk-yrs)    Types: Cigarettes    Start date: 02/08/1965    Quit date: 02/09/1975    Years since quitting: 48.6   Smokeless tobacco: Never   Tobacco comments:    started at age 3.   quit in the 31s.  Vaping Use   Vaping status: Never Used  Substance and Sexual Activity   Alcohol use: Not Currently   Drug use: No    Comment: CBD and hemp OIL    Sexual activity: Not  Currently    Partners: Male  Other Topics Concern   Not on file  Social History Narrative   Son lives with her.   Right-handed.   2 soft drinks per day.   Social Drivers of Corporate investment banker Strain: Low Risk  (09/07/2023)   Overall Financial Resource Strain (CARDIA)    Difficulty of  Paying Living Expenses: Not hard at all  Food Insecurity: No Food Insecurity (09/07/2023)   Hunger Vital Sign    Worried About Running Out of Food in the Last Year: Never true    Ran Out of Food in the Last Year: Never true  Transportation Needs: No Transportation Needs (09/07/2023)   PRAPARE - Administrator, Civil Service (Medical): No    Lack of Transportation (Non-Medical): No  Physical Activity: Patient Declined (09/07/2023)   Exercise Vital Sign    Days of Exercise per Week: Patient declined    Minutes of Exercise per Session: Patient declined  Stress: No Stress Concern Present (09/07/2023)   [employer] of Occupational Health - Occupational Stress Questionnaire    Feeling of Stress: Not at all  Social Connections: Socially Isolated (09/07/2023)   Social Connection and Isolation Panel    Frequency of Communication with Friends and Family: More than three times a week    Frequency of Social Gatherings with Friends and Family: Once a week    Attends Religious Services: Never    Database administrator or Organizations: No    Attends Banker Meetings: Never    Marital Status: Divorced    Tobacco Counseling Counseling given: Not Answered Tobacco comments: started at age 39.   quit in the 1970s.    Clinical Intake:  Pre-visit preparation completed: Yes  Pain : 0-10 Pain Score: 10-Worst pain ever Pain Type: Chronic pain Pain Location: Back Pain Orientation: Mid, Lower Pain Descriptors / Indicators: Aching, Burning, Nagging Pain Onset: Today Pain Frequency: Constant     BMI - recorded: 47.24 Nutritional Status: BMI > 30  Obese Nutritional Risks: None Diabetes: No  Lab Results  Component Value Date   HGBA1C 5.8 (H) 10/28/2022   HGBA1C 5.6 04/15/2020   HGBA1C 5.7 (H) 12/29/2017     How often do you need to have someone help you when you read instructions, pamphlets, or other written materials from your doctor or pharmacy?: 1 -  Never  Interpreter Needed?: No  Information entered by :: Blakely Gluth,cma   Activities of Daily Living     09/07/2023    3:53 PM  In your present state of health, do you have any difficulty performing the following activities:  Hearing? 0  Vision? 0  Difficulty concentrating or making decisions? 0  Walking or climbing stairs? 0  Dressing or bathing? 0  Doing errands, shopping? 0  Preparing Food and eating ? N  Using the Toilet? N  In the past six months, have you accidently leaked urine? N  Do you have problems with loss of bowel control? N  Managing your Medications? N  Managing your Finances? Y  Housekeeping or managing your Housekeeping? N    Patient Care Team: Amon Aloysius BRAVO, MD as PCP - General Jeffrie Oneil BROCKS, MD as PCP - Cardiology (Cardiology) Gaston Hamilton, MD as Consulting Physician (Urology) Deward Specking, MD as Consulting Physician (Orthopedic Surgery) Debrah Ade, MD as Consulting Physician (Obstetrics and Gynecology) Beuford Oneil, MD as Consulting Physician (Orthopedic Surgery) Ema Francois, MD as Referring Physician (Pain Medicine) Aneita Gist  T, MD (Inactive) as Consulting Physician (Gastroenterology) Carla Milling, RPH-CPP (Pharmacist)  I have updated your Care Teams any recent Medical Services you may have received from other providers in the past year.     Assessment:   This is a routine wellness examination for [facility].  Hearing/Vision screen Hearing Screening - Comments:: No difficulties  Vision Screening - Comments:: No difficulties   Goals Addressed             This Visit's Progress    DIET - EAT MORE FRUITS AND VEGETABLES   On track      Depression Screen     09/07/2023    3:57 PM 06/29/2023   10:45 AM 10/29/2022   11:07 AM 07/23/2022   11:18 AM 03/12/2022   11:24 AM 11/09/2021    1:05 PM 09/04/2021   10:51 AM  PHQ 2/9 Scores  PHQ - 2 Score 0 2 2 3 3  0 3  PHQ- 9 Score 2 12 13 14 12  9     Fall Risk     09/07/2023     3:52 PM 06/29/2023   10:45 AM 10/29/2022   10:48 AM 03/12/2022   10:36 AM 11/09/2021    1:03 PM  Fall Risk   Falls in the past year? 0 0 0 0 0  Number falls in past yr: 0 0 0 0 0  Injury with Fall? 0 0 0 0 0  Risk for fall due to : No Fall Risks      Follow up Falls evaluation completed Falls evaluation completed;Education provided Falls evaluation completed;Education provided Falls evaluation completed Falls prevention discussed      Data saved with a previous flowsheet row definition    MEDICARE RISK AT HOME:  Medicare Risk at Home Any stairs in or around the home?: Yes If so, are there any without handrails?: No Home free of loose throw rugs in walkways, pet beds, electrical cords, etc?: Yes Adequate lighting in your home to reduce risk of falls?: Yes Life alert?: No Use of a cane, walker or w/c?: Yes Grab bars in the bathroom?: Yes Shower chair or bench in shower?: Yes Elevated toilet seat or a handicapped toilet?: Yes  TIMED UP AND GO:  Was the test performed?  no  Cognitive Function: 6CIT completed    09/23/2017    3:41 PM 09/15/2016    1:30 PM  MMSE - Mini Mental State Exam  Orientation to time 5 5   Orientation to Place 5 5   Registration 3 3   Attention/ Calculation 5 3   Recall 3 3   Language- name 2 objects 2 2   Language- repeat 1 1  Language- follow 3 step command 3 3   Language- read & follow direction 1 1   Write a sentence 1 1   Copy design 1 1   Total score 30 28      Data saved with a previous flowsheet row definition      10/28/2022    1:55 PM  Montreal Cognitive Assessment   Visuospatial/ Executive (0/5) 3  Naming (0/3) 2  Attention: Read list of digits (0/2) 2  Attention: Read list of letters (0/1) 1  Attention: Serial 7 subtraction starting at 100 (0/3) 3  Language: Repeat phrase (0/2) 2  Language : Fluency (0/1) 1  Abstraction (0/2) 2  Delayed Recall (0/5) 3  Orientation (0/6) 6  Total 25      09/07/2023    3:57 PM 11/09/2021  1:10  PM 10/11/2020    9:10 AM  6CIT Screen  What Year? 0 points 0 points 0 points  What month? 0 points 0 points 0 points  What time? 0 points 0 points 0 points  Count back from 20 0 points 0 points 0 points  Months in reverse 0 points 0 points 0 points  Repeat phrase 0 points 0 points 4 points  Total Score 0 points 0 points 4 points    Immunizations Immunization History  Administered Date(s) Administered   Fluad Quad(high Dose 65+) 10/31/2018   Influenza Split 02/17/2011   Influenza Whole 01/14/2010   Influenza, High Dose Seasonal PF 01/24/2015, 12/16/2017   Influenza,inj,Quad PF,6+ Mos 10/22/2013   Influenza-Unspecified 03/13/2016, 10/20/2016   Pneumococcal Conjugate-13 09/10/2013   Pneumococcal Polysaccharide-23 04/14/2010, 08/21/2020   Td 02/08/1998, 04/14/2010   Zoster, Live 12/06/2013    Screening Tests Health Maintenance  Topic Date Due   Zoster Vaccines- Shingrix  (1 of 2) 09[birth year]30/1964   DTaP/Tdap/Td (3 - Tdap) 04/13/2020   INFLUENZA VACCINE  09/09/2023   Medicare Annual Wellness (AWV)  09/06/2024   Pneumococcal Vaccine: 50+ Years  Completed   DEXA SCAN  Completed   Hepatitis C Screening  Completed   Hepatitis B Vaccines  Aged Out   HPV VACCINES  Aged Out   Meningococcal B Vaccine  Aged Out   Colonoscopy  Discontinued   COVID-19 Vaccine  Discontinued    Health Maintenance  Health Maintenance Due  Topic Date Due   Zoster Vaccines- Shingrix  (1 of 2) 09/30/1964   DTaP/Tdap/Td (3 - Tdap) 04/13/2020   Health Maintenance Items Addressed:patient declined vaccinations   Additional Screening:  Vision Screening: Recommended annual ophthalmology exams for early detection of glaucoma and other disorders of the eye. Would you like a referral to an eye doctor? No    Dental Screening: Recommended annual dental exams for proper oral hygiene  Community Resource Referral / Chronic Care Management: CRR required this visit?  No   CCM required this visit?  No   Plan:     I have personally reviewed and noted the following in the patient's chart:   Medical and social history Use of alcohol, tobacco or illicit drugs  Current medications and supplements including opioid prescriptions. Patient is not currently taking opioid prescriptions. Functional ability and status Nutritional status Physical activity Advanced directives List of other physicians Hospitalizations, surgeries, and ER visits in previous 12 months Vitals Screenings to include cognitive, depression, and falls Referrals and appointments  In addition, I have reviewed and discussed with patient certain preventive protocols, quality metrics, and best practice recommendations. A written personalized care plan for preventive services as well as general preventive health recommendations were provided to patient.   Lyle MARLA Right, [place]   09/07/2023   After Visit Summary: (MyChart) Due to this being a telephonic visit, the after visit summary with patients personalized plan was offered to patient via MyChart   Notes: Nothing significant to report at this time.

## 2023-09-07 NOTE — Patient Instructions (Signed)
 [name] , Thank you for taking time out of your busy schedule to complete your Annual Wellness Visit with me. I enjoyed our conversation and look forward to speaking with you again next year. I, as well as your care team,  appreciate your ongoing commitment to your health goals. Please review the following plan we discussed and let me know if I can assist you in the future. Your Game plan/ To Do List    Referrals: If you haven't heard from the office you've been referred to, please reach out to them at the phone provided.  None  Follow up Visits: Next Medicare AWV with our clinical staff: 09/12/2024   Have you seen your provider in the last 6 months (3 months if uncontrolled diabetes)? Yes Next Office Visit with your provider: 09/30/2023  Clinician Recommendations:  Aim for 30 minutes of exercise or brisk walking, 6-8 glasses of water, and 5 servings of fruits and vegetables each day.       This is a list of the screening recommended for you and due dates:  Health Maintenance  Topic Date Due   Zoster (Shingles) Vaccine (1 of 2) 09/30/1964   DTaP/Tdap/Td vaccine (3 - Tdap) 04/13/2020   Flu Shot  09/09/2023   Medicare Annual Wellness Visit  09/06/2024   Pneumococcal Vaccine for age over 49  Completed   DEXA scan (bone density measurement)  Completed   Hepatitis C Screening  Completed   Hepatitis B Vaccine  Aged Out   HPV Vaccine  Aged Out   Meningitis B Vaccine  Aged Out   Colon Cancer Screening  Discontinued   COVID-19 Vaccine  Discontinued    Advanced directives: (Declined) Advance directive discussed with you today. Even though you declined this today, please call our office should you change your mind, and we can give you the proper paperwork for you to fill out. Advance Care Planning is important because it:  [x]  Makes sure you receive the medical care that is consistent with your values, goals, and preferences  [x]  It provides guidance to your family and loved ones and  reduces their decisional burden about whether or not they are making the right decisions based on your wishes.  Follow the link provided in your after visit summary or read over the paperwork we have mailed to you to help you started getting your Advance Directives in place. If you need assistance in completing these, please reach out to us  so that we can help you!  See attachments for Preventive Care and Fall Prevention Tips.

## 2023-09-09 ENCOUNTER — Encounter: Payer: Self-pay | Admitting: Urology

## 2023-09-09 ENCOUNTER — Ambulatory Visit: Admitting: Urology

## 2023-09-09 VITALS — BP 126/59 | HR 71 | Ht 61.0 in | Wt 250.0 lb

## 2023-09-09 DIAGNOSIS — R8290 Unspecified abnormal findings in urine: Secondary | ICD-10-CM

## 2023-09-09 DIAGNOSIS — N3941 Urge incontinence: Secondary | ICD-10-CM | POA: Diagnosis not present

## 2023-09-09 LAB — URINALYSIS, ROUTINE W REFLEX MICROSCOPIC
Bilirubin, UA: NEGATIVE
Glucose, UA: NEGATIVE
Ketones, UA: NEGATIVE
Leukocytes,UA: NEGATIVE
Nitrite, UA: POSITIVE — AB
Specific Gravity, UA: >1.03 — ABNORMAL HIGH (ref 1.005–1.030)
Urobilinogen, Ur: 0.2 mg/dL (ref 0.2–1.0)
pH, UA: 5.5 (ref 5.0–7.5)

## 2023-09-09 LAB — MICROSCOPIC EXAMINATION
Epithelial Cells (non renal): >10 /HPF — ABNORMAL HIGH (ref 0–10)
RBC, Urine: NONE SEEN /HPF (ref 0–2)

## 2023-09-09 LAB — BLADDER SCAN AMB NON-IMAGING: Scan Result: 5

## 2023-09-09 MED ORDER — SOLIFENACIN SUCCINATE 10 MG PO TABS: 10.0000 mg | ORAL_TABLET | Freq: Every day | ORAL | 11 refills | Status: DC

## 2023-09-09 NOTE — Progress Notes (Signed)
 Assessment: 1. Urge incontinence   2. Abnormal urine findings     Plan: I personally reviewed the patient's chart including provider notes. Urine culture sent today. Diagnosis and management of urge incontinence/OAB discussed with the patient.  Options for management including avoidance of dietary irritants, behavioral modification, medical therapy, neuromodulation, and chemodenervation discussed. Bladder diet sheet given. Trial of solifenacin  10 mg daily.  Use and side effects discussed. Return to office in 6 weeks for reevaluation.  Chief Complaint:  Chief Complaint  Patient presents with   Urinary Incontinence    History of Present Illness:  [name] is a 78 y.o. female who is seen in consultation from Manuelita Flatness, PA-C for evaluation of urinary incontinence. She reports symptoms of frequency, urgency, nocturia, urge incontinence, and unaware incontinence at night.  She has had symptoms for at least 3 months with some recent worsening.  She is using adult diapers/pads throughout the day for her incontinence.  No dysuria or gross hematuria.  She reports that she was recently treated for UTI diagnosed at urgent care.  No culture results available. She does not have problems with constipation.  She actually has frequent loose bowel movements associated with her IBS. She is not aware of prior medical therapy although she does have solifenacin  on her medicine list.   Past Medical History:  Past Medical History:  Diagnosis Date   Allergy    Anemia    Anxiety    Barrett's esophagus    Bilateral carpal tunnel syndrome    Dr. Deward, having injection therapy   C. difficile diarrhea 08/01/2012   severe 2014   Carotid arterial disease (HCC)    Cataract    removed both eyes    Chronic combined systolic and diastolic CHF (congestive heart failure) (HCC)    Chronic cystitis    Dr. Gaston   Chronic lower back pain    Chronic pain syndrome    Depression    Dizziness     DJD (degenerative joint disease)    bilateral hands; knees   Family history of anesthesia complication    Mother had severe N/V   Fibromyalgia    GERD (gastroesophageal reflux disease)    H/O cardiac catheterization    (-) cath 12-2002  , cath again 2011 (-)   History of colon polyps    History of nuclear stress test    Myoview  3/23: EF 51, no ischemia; low risk (echocardiogram in 11/22 with normal EF)   HTN (hypertension)    Hyperlipidemia    past hx    IBS (irritable bowel syndrome)    Insomnia    Migraine    Morbid obesity (HCC)    Non-ischemic cardiomyopathy (HCC)    Osteoporosis    pt unsure of this   RLS (restless legs syndrome)    Vertigo    Vitamin B 12 deficiency 04/09/2013    Past Surgical History:  Past Surgical History:  Procedure Laterality Date   Arm surgery Left    don't remember what they did; arm wasn't broken   BILATERAL KNEE ARTHROSCOPY Bilateral    BIOPSY  08/07/2018   Procedure: BIOPSY;  Surgeon: Aneita Gwendlyn DASEN, MD;  Location: [facility];  Service: Endoscopy;;   CARDIAC CATHETERIZATION  01/22/10   clean cath   CATARACT EXTRACTION W/ INTRAOCULAR LENS  IMPLANT, BILATERAL Bilateral    CHOLECYSTECTOMY N/A 10/25/2016   Procedure: LAPAROSCOPIC CHOLECYSTECTOMY;  Surgeon: Sebastian Moles, MD;  Location: [facility] OR;  Service: General;  Laterality: N/A;  COLONOSCOPY     COLONOSCOPY W/ POLYPECTOMY     ESOPHAGOGASTRODUODENOSCOPY (EGD) WITH PROPOFOL  N/A 08/07/2018   Procedure: ESOPHAGOGASTRODUODENOSCOPY (EGD) WITH PROPOFOL ;  Surgeon: Aneita Gwendlyn DASEN, MD;  Location: [facility];  Service: Endoscopy;  Laterality: N/A;   FOOT SURGERY Bilateral    toenails removed; callus removed on right; hammertoes right   Knot     removed from right neck; not a goiter   LUMBAR DISC SURGERY     L5 S1 anterior fusion   OOPHORECTOMY     RIGHT/LEFT HEART CATH AND CORONARY ANGIOGRAPHY N/A 05/06/2017   Procedure: RIGHT/LEFT HEART CATH AND CORONARY ANGIOGRAPHY;  Surgeon: Mady Bruckner, MD;  Location: [facility];  Service: Cardiovascular;  Laterality: N/A;   SHOULDER ARTHROSCOPY Right    TOTAL KNEE ARTHROPLASTY  10/05/2011   Procedure: TOTAL KNEE ARTHROPLASTY;  Surgeon: Maude KANDICE Herald, MD;  Location: [facility] OR;  Service: Orthopedics;  Laterality: Right;   UPPER GASTROINTESTINAL ENDOSCOPY     VAGINAL HYSTERECTOMY     for endometriosis    Allergies:  Allergies  Allergen Reactions   Dextromethorphan-Guaifenesin Nausea And Vomiting   Morphine Nausea And Vomiting   Gabapentin Other (See Comments)    Dizziness, made her feel loopy   Penicillins Rash    > 30 years ago; best I can remember it was just a light rash on my arm Did it involve swelling of the face/tongue/throat, SOB, or low BP? No Did it involve sudden or severe rash/hives, skin peeling, or any reaction on the inside of your mouth or nose? Unknown Did you need to seek medical attention at a hospital or doctor's office? No When did it last happen?      more than 30 years  If all above answers are "NO", may proceed with cephalosporin use.     Family History:  Family History  Problem Relation Age of Onset   Lung cancer Mother    Dementia Father    CAD Father        dx in his 88s   Stroke Father    Congestive Heart Failure Father    Colon cancer Maternal Grandfather    Esophageal cancer Brother    Breast cancer Sister    Diabetes Other        Aunt   Colon cancer Maternal Uncle        2   Hepatitis C Brother    Rectal cancer Neg Hx    Stomach cancer Neg Hx    Colon polyps Neg Hx     Social History:  Social History   Tobacco Use   Smoking status: Former    Current packs/day: 0.00    Average packs/day: 0.5 packs/day for 10.0 years (5.0 ttl pk-yrs)    Types: Cigarettes    Start date: 02/08/1965    Quit date: 02/09/1975    Years since quitting: 48.6   Smokeless tobacco: Never   Tobacco comments:    started at age 55.   quit in the 80s.  Vaping Use   Vaping status: Never  Used  Substance Use Topics   Alcohol use: Not Currently   Drug use: No    Comment: CBD and hemp OIL     Review of symptoms:  Constitutional:  Negative for unexplained weight loss, night sweats, fever, chills ENT:  Negative for nose bleeds, sinus pain, painful swallowing CV:  Negative for chest pain, shortness of breath, exercise intolerance, palpitations, loss of consciousness Resp:  Negative for cough, wheezing,  shortness of breath GI:  Negative for nausea, vomiting, diarrhea, bloody stools GU:  Positives noted in HPI; otherwise negative for gross hematuria, dysuria Neuro:  Negative for seizures, poor balance, limb weakness, slurred speech Psych:  Negative for lack of energy, depression, anxiety Endocrine:  Negative for polydipsia, polyuria, symptoms of hypoglycemia (dizziness, hunger, sweating) Hematologic:  Negative for anemia, purpura, petechia, prolonged or excessive bleeding, use of anticoagulants  Allergic:  Negative for difficulty breathing or choking as a result of exposure to anything; no shellfish allergy; no allergic response (rash/itch) to materials, foods  Physical exam: BP (!) 126/59   Pulse 71   Ht 5' 1 (1.549 m)   Wt 250 lb (113.4 kg)   BMI 47.24 kg/m  GENERAL APPEARANCE:  Well appearing, well developed, well nourished, NAD HEENT: Atraumatic, Normocephalic, oropharynx clear. NECK: Supple without lymphadenopathy or thyromegaly. LUNGS: Clear to auscultation bilaterally. HEART: Regular Rate and Rhythm without murmurs, gallops, or rubs. ABDOMEN: Soft, non-tender, No Masses. EXTREMITIES: Moves all extremities well.  Without clubbing, cyanosis, or edema. NEUROLOGIC:  Alert and oriented x 3, normal gait, CN II-XII grossly intact.  MENTAL STATUS:  Appropriate. BACK:  Non-tender to palpation.  No CVAT SKIN:  Warm, dry and intact.    Results: U/A: 6-10 WBCs, 0 RBCs, many bacteria, nitrite positive  Results for orders placed or performed in visit on 09/09/23 (from the  past 24 hours)  BLADDER SCAN AMB NON-IMAGING   Collection Time: 09/09/23 11:10 AM  Result Value Ref Range   Scan Result 5

## 2023-09-13 LAB — URINE CULTURE

## 2023-09-14 ENCOUNTER — Ambulatory Visit: Payer: Self-pay | Admitting: Urology

## 2023-09-14 MED ORDER — CEFDINIR 300 MG PO CAPS: 300.0000 mg | ORAL_CAPSULE | Freq: Two times a day (BID) | ORAL | 0 refills | Status: AC

## 2023-09-30 ENCOUNTER — Encounter: Payer: Self-pay | Admitting: Internal Medicine

## 2023-09-30 ENCOUNTER — Ambulatory Visit: Admitting: Internal Medicine

## 2023-09-30 VITALS — BP 126/68 | HR 62 | Temp 97.5°F | Resp 20 | Ht 61.0 in | Wt 254.1 lb

## 2023-09-30 DIAGNOSIS — Z7185 Encounter for immunization safety counseling: Secondary | ICD-10-CM

## 2023-09-30 DIAGNOSIS — F331 Major depressive disorder, recurrent, moderate: Secondary | ICD-10-CM

## 2023-09-30 DIAGNOSIS — E782 Mixed hyperlipidemia: Secondary | ICD-10-CM

## 2023-09-30 DIAGNOSIS — K22719 Barrett's esophagus with dysplasia, unspecified: Secondary | ICD-10-CM

## 2023-09-30 DIAGNOSIS — Z09 Encounter for follow-up examination after completed treatment for conditions other than malignant neoplasm: Secondary | ICD-10-CM

## 2023-09-30 DIAGNOSIS — I5032 Chronic diastolic (congestive) heart failure: Secondary | ICD-10-CM

## 2023-09-30 DIAGNOSIS — R739 Hyperglycemia, unspecified: Secondary | ICD-10-CM | POA: Diagnosis not present

## 2023-09-30 DIAGNOSIS — I6523 Occlusion and stenosis of bilateral carotid arteries: Secondary | ICD-10-CM | POA: Diagnosis not present

## 2023-09-30 DIAGNOSIS — E559 Vitamin D deficiency, unspecified: Secondary | ICD-10-CM | POA: Diagnosis not present

## 2023-09-30 DIAGNOSIS — I428 Other cardiomyopathies: Secondary | ICD-10-CM | POA: Diagnosis not present

## 2023-09-30 DIAGNOSIS — E538 Deficiency of other specified B group vitamins: Secondary | ICD-10-CM | POA: Diagnosis not present

## 2023-09-30 LAB — LIPID PANEL
Cholesterol: 129 mg/dL (ref 0–200)
HDL: 52.2 mg/dL (ref >39.00)
LDL Cholesterol: 56 mg/dL (ref 0–99)
NonHDL: 77.21
Total CHOL/HDL Ratio: 2
Triglycerides: 106 mg/dL (ref 0.0–149.0)
VLDL: 21.2 mg/dL (ref 0.0–40.0)

## 2023-09-30 LAB — BASIC METABOLIC PANEL WITH GFR
BUN: 17 mg/dL (ref 6–23)
CO2: 28 meq/L (ref 19–32)
Calcium: 8.6 mg/dL (ref 8.4–10.5)
Chloride: 105 meq/L (ref 96–112)
Creatinine, Ser: 1 mg/dL (ref 0.40–1.20)
GFR: 53.77 mL/min — ABNORMAL LOW (ref >60.00)
Glucose, Bld: 104 mg/dL — ABNORMAL HIGH (ref 70–99)
Potassium: 4.4 meq/L (ref 3.5–5.1)
Sodium: 141 meq/L (ref 135–145)

## 2023-09-30 LAB — VITAMIN D 25 HYDROXY (VIT D DEFICIENCY, FRACTURES): VITD: 29.69 ng/mL — ABNORMAL LOW (ref 30.00–100.00)

## 2023-09-30 LAB — B12 AND FOLATE PANEL
Folate: >23.4 ng/mL (ref >5.9)
Vitamin B-12: 168 pg/mL — ABNORMAL LOW (ref 211–911)

## 2023-09-30 LAB — HEMOGLOBIN A1C: Hgb A1c MFr Bld: 6.1 % (ref 4.6–6.5)

## 2023-09-30 LAB — AST: AST: 14 U/L (ref 0–37)

## 2023-09-30 LAB — ALT: ALT: 8 U/L (ref 0–35)

## 2023-09-30 NOTE — Patient Instructions (Addendum)
 Call and schedule a visit with your heart doctor  Restart  aspirin  81 mg, 1 tablet daily  Vaccines to consider: Tetanus shot, Tdap. Shingles shot Pneumonia shot (PNM 20) Flu shot every fall RSV A COVID booster   Check the  blood pressure regularly Blood pressure goal:  between 110/65 and  135/85. If it is consistently higher or lower, let me know     GO TO THE LAB :  Get the blood work   Your results will be posted on MyChart with my comments  Go to the front desk for the checkout Please make an appointment  for a check up in 4 to 6 months

## 2023-09-30 NOTE — Progress Notes (Signed)
 Subjective:    Patient ID: [name], female    DOB: [birth year], 78 y.o.   MRN: [id number]  DOS:  09/30/2023 Type of visit - description: f/u, here w/ her son  Chronic medicalproblems addressed. Feels very well OAB sxs not improved Occ SOB, no SSCP   Review of Systems See above   Past Medical History:  Diagnosis Date   Allergy    Anemia    Anxiety    Barrett's esophagus    Bilateral carpal tunnel syndrome    Dr. Deward, having injection therapy   C. difficile diarrhea 08/01/2012   severe 2014   Carotid arterial disease (HCC)    Cataract    removed both eyes    Chronic combined systolic and diastolic CHF (congestive heart failure) (HCC)    Chronic cystitis    Dr. Gaston   Chronic lower back pain    Chronic pain syndrome    Depression    Dizziness    DJD (degenerative joint disease)    bilateral hands; knees   Family history of anesthesia complication    Mother had severe N/V   Fibromyalgia    GERD (gastroesophageal reflux disease)    H/O cardiac catheterization    (-) cath 12-2002  , cath again 2011 (-)   History of colon polyps    History of nuclear stress test    Myoview  3/23: EF 51, no ischemia; low risk (echocardiogram in 11/22 with normal EF)   HTN (hypertension)    Hyperlipidemia    past hx    IBS (irritable bowel syndrome)    Insomnia    Migraine    Morbid obesity (HCC)    Non-ischemic cardiomyopathy (HCC)    Osteoporosis    pt unsure of this   RLS (restless legs syndrome)    Vertigo    Vitamin B 12 deficiency 04/09/2013    Past Surgical History:  Procedure Laterality Date   Arm surgery Left    don't remember what they did; arm wasn't broken   BILATERAL KNEE ARTHROSCOPY Bilateral    BIOPSY  08/07/2018   Procedure: BIOPSY;  Surgeon: Aneita Gwendlyn DASEN, MD;  Location: [facility];  Service: Endoscopy;;   CARDIAC CATHETERIZATION  01/22/10   clean cath   CATARACT EXTRACTION W/ INTRAOCULAR LENS  IMPLANT, BILATERAL Bilateral     CHOLECYSTECTOMY N/A 10/25/2016   Procedure: LAPAROSCOPIC CHOLECYSTECTOMY;  Surgeon: Sebastian Moles, MD;  Location: [facility] OR;  Service: General;  Laterality: N/A;   COLONOSCOPY     COLONOSCOPY W/ POLYPECTOMY     ESOPHAGOGASTRODUODENOSCOPY (EGD) WITH PROPOFOL  N/A 08/07/2018   Procedure: ESOPHAGOGASTRODUODENOSCOPY (EGD) WITH PROPOFOL ;  Surgeon: Aneita Gwendlyn DASEN, MD;  Location: [facility];  Service: Endoscopy;  Laterality: N/A;   FOOT SURGERY Bilateral    toenails removed; callus removed on right; hammertoes right   Knot     removed from right neck; not a goiter   LUMBAR DISC SURGERY     L5 S1 anterior fusion   OOPHORECTOMY     RIGHT/LEFT HEART CATH AND CORONARY ANGIOGRAPHY N/A 05/06/2017   Procedure: RIGHT/LEFT HEART CATH AND CORONARY ANGIOGRAPHY;  Surgeon: Mady Bruckner, MD;  Location: [facility];  Service: Cardiovascular;  Laterality: N/A;   SHOULDER ARTHROSCOPY Right    TOTAL KNEE ARTHROPLASTY  10/05/2011   Procedure: TOTAL KNEE ARTHROPLASTY;  Surgeon: Maude KANDICE Herald, MD;  Location: [facility] OR;  Service: Orthopedics;  Laterality: Right;   UPPER GASTROINTESTINAL ENDOSCOPY     VAGINAL HYSTERECTOMY  for endometriosis    Current Outpatient Medications  Medication Instructions   albuterol  (VENTOLIN  HFA) 108 (90 Base) MCG/ACT inhaler TAKE 2 PUFFS BY MOUTH EVERY 6 HOURS AS NEEDED FOR WHEEZE OR SHORTNESS OF BREATH   aspirin  EC 81 mg, Daily   Cyanocobalamin  (VITAMIN B12) 1000 MCG TBCR 1 tablet, Daily   dicyclomine  (BENTYL ) 20 mg, Oral, 3 times daily before meals & bedtime   HYDROcodone -acetaminophen  (NORCO) 10-325 MG tablet 1 tablet, Every 6 hours PRN   loperamide  (IMODIUM ) 2 MG capsule Patient takes 2-4 tablets by mouth as needed for diarrhea or loose stools   metoprolol  succinate (TOPROL -XL) 50 MG 24 hr tablet TAKE 1 TABLET BY MOUTH EVERY DAY WITH OR IMMEDIATELY FOLLOWING A MEAL   NARCAN 4 MG/0.1ML LIQD nasal spray kit 1 spray, Once PRN   nitroGLYCERIN  (NITROSTAT ) 0.4 mg,  Sublingual, Every 5 min PRN   omeprazole  (PRILOSEC) 40 mg, Oral, 2 times daily   ondansetron  (ZOFRAN ) 4 mg, Oral, 2 times daily PRN   Propylene Glycol 0.6 % SOLN 2 drops, As needed   rOPINIRole  (REQUIP ) 1 mg, Oral, 2 times daily   rosuvastatin  (CRESTOR ) 40 mg, Oral, Daily at bedtime   sacubitril -valsartan  (ENTRESTO ) 97-103 MG 1 tablet, Oral, 2 times daily   solifenacin  (VESICARE ) 10 mg, Oral, Daily   Vitamin D  5,000 Units, Daily       Objective:   Physical Exam BP 126/68   Pulse 62   Temp (!) 97.5 F (36.4 C) (Oral)   Resp 20   Ht 5' 1 (1.549 m)   Wt 254 lb 2 oz (115.3 kg)   SpO2 95%   BMI 48.02 kg/m  General:   Well developed, NAD, BMI noted. HEENT:  Normocephalic . Face symmetric, atraumatic Lungs:  CTA B Normal respiratory effort, no intercostal retractions, no accessory muscle use. Heart: RRR,  no murmur.  Lower extremities: no pretibial edema bilaterally.  Very sore to light palpation, chronic finding. Skin: Not pale. Not jaundice Neurologic:  alert & oriented X3.  Speech normal, gait appropriate for age, assisted by a cane Psych--  Cognition and judgment appear intact.  Cooperative with normal attention span and concentration.  Behavior appropriate. No anxious or depressed appearing.      Assessment   Assessment   Hyperglycemia HTN Hyperlipidemia-  Pravachol , : Myalgias. Intolerant to Zetia  (joint aches), see OV  06/2016  Depression, Anxiety (on clonazepam ), insomnia (on trazodone ):  depression x many  years, remotely on zoloft  and other meds per psych, I rx lexapro  2015, then was rx effexor  x a while Morbid obesity CV: CHF/ non ischemic cardiomyopathy dx 04-2017; cath normal coronaries, EF ~ 30% Carotid artery disease: 1 to 39% bilateral carotids 09-2016, start aspirin  per neurology GI:  --GERD, Barrett's esophagus, had a EGD 07/2018  --h/o persistent C. difficile diarrhea 2014 --Cholecystectomy B12 deficiency Osteopenia: Dexa 2015 showed osteopenia, T  score -1.9 (10/2016): tscore 2021 - 1.8; Rx cs, vit D PULM: --NPSG 2008:  No osa, PLMS 4/hr with arousal and awakening --Home sleep study 11-2020 mild sleep apnea, further advised per pulmonary. --RLS - Requip  --Has albuterol , prn Migraines, f/u [facility] -- off topamax  as off 06-2016 MSK: --Back pain, chronic: pain meds rx by ortho, Workers comp MD @ [employer] pain mngmt [place] --DJD,CTS B ([facility]) --Fibromyalgia.  See note from 02/14/2018 OAB, LUTS-- on myrbetriq  Dr McDarmoth  Raynaud phenomena---- DX 07/2017 [facility], Saw neurology 10/28/2022, B12 and RPR normal, Rx Aricept  COVID-19, pulmonary emboli: Monoclonal infusion 11/13/2019.  anticoag until  08-2020.  PLAN Osteopenia: Last DEXA 2021, -1.8.  Stable   Cardiomyopathy, next cardiology visit should be around November 2025.  Seems to be doing well.  Continue present care. Chronic heart failure with preserved ejection fraction: Echo 2022 increased to 55 to 60% Mild hyperglycemia: Check A1c HTN: No ambulatory BPs, BP today satisfactory, continue metoprolol , Entresto , checking labs. High cholesterol: On Crestor , checking labs MCI: Prescribed Aricept  by neurology last year, patient stopped due to perceived side effects and does not plan to restart. Depression-major-recurrent anxiety insomnia:  Self stopped sertraline  and trazodone , reports emotionally is doing okay and is taking a OTC that is helping insomnia well. H/o stroke, not taking aspirin , recommend to do. OAB/Urge incontinence: Saw urology August 1, Rx solifenacin  10 mg daily.  Not very effective, recommend to continue working with urology Vitamin D  deficiency: On supplements.  Labs. B12 deficiency: On supplements.  Labs. Morbid obesity: Not making much progress. Preventive care: Vaccines I recommend--Tdap, Shingrix , PNM 20, flu shot, RSV, COVID booster (declines). RTC 4 to 6 months

## 2023-10-02 NOTE — Assessment & Plan Note (Addendum)
 Osteopenia: Last DEXA 2021, -1.8.  Stable   Cardiomyopathy, next cardiology visit should be around November 2025.  Seems to be doing well.  Continue present care. Chronic heart failure with preserved ejection fraction: Echo 2022 increased to 55 to 60% Mild hyperglycemia: Check A1c HTN: No ambulatory BPs, BP today satisfactory, continue metoprolol , Entresto , checking labs. High cholesterol: On Crestor , checking labs [facility]: Prescribed Aricept  by neurology last year, patient stopped due to perceived side effects and does not plan to restart. Depression-major-recurrent anxiety insomnia: Self stopped sertraline  and trazodone , reports emotionally is doing okay and is taking a OTC that is helping insomnia well. H/o stroke, not taking aspirin , recommend to do. OAB/Urge incontinence: Saw urology August 1, Rx solifenacin  10 mg daily.  Not very effective, recommend to continue working with urology Vitamin D  deficiency: On supplements.  Labs. B12 deficiency: On supplements.  Labs. Morbid obesity: Not making much progress. Preventive care: Vaccines I recommend--Tdap, Shingrix , PNM 20, flu shot, RSV, COVID booster (declines). RTC 4 to 6 months

## 2023-10-03 ENCOUNTER — Ambulatory Visit: Payer: Self-pay | Admitting: Internal Medicine

## 2023-10-03 MED ORDER — VITAMIN D (ERGOCALCIFEROL) 1.25 MG (50000 UNIT) PO CAPS: 50000.0000 [IU] | ORAL_CAPSULE | ORAL | 0 refills | Status: AC

## 2023-10-07 DIAGNOSIS — K2970 Gastritis, unspecified, without bleeding: Secondary | ICD-10-CM | POA: Diagnosis not present

## 2023-10-07 DIAGNOSIS — R112 Nausea with vomiting, unspecified: Secondary | ICD-10-CM | POA: Diagnosis not present

## 2023-10-21 ENCOUNTER — Encounter: Payer: Self-pay | Admitting: Urology

## 2023-10-21 ENCOUNTER — Ambulatory Visit: Admitting: Urology

## 2023-10-21 VITALS — BP 120/67 | HR 70 | Ht 61.0 in | Wt 250.0 lb

## 2023-10-21 DIAGNOSIS — Z87440 Personal history of urinary (tract) infections: Secondary | ICD-10-CM

## 2023-10-21 DIAGNOSIS — N3941 Urge incontinence: Secondary | ICD-10-CM | POA: Diagnosis not present

## 2023-10-21 DIAGNOSIS — R8290 Unspecified abnormal findings in urine: Secondary | ICD-10-CM

## 2023-10-21 LAB — URINALYSIS, ROUTINE W REFLEX MICROSCOPIC
Bilirubin, UA: NEGATIVE
Glucose, UA: NEGATIVE
Ketones, UA: NEGATIVE
Nitrite, UA: POSITIVE — AB
Specific Gravity, UA: 1.02 (ref 1.005–1.030)
Urobilinogen, Ur: 0.2 mg/dL (ref 0.2–1.0)
pH, UA: 5.5 (ref 5.0–7.5)

## 2023-10-21 LAB — MICROSCOPIC EXAMINATION
Epithelial Cells (non renal): >10 /HPF — AB (ref 0–10)
WBC, UA: >30 /HPF — AB (ref 0–5)

## 2023-10-21 MED ORDER — SULFAMETHOXAZOLE-TRIMETHOPRIM 800-160 MG PO TABS: 1.0000 | ORAL_TABLET | Freq: Two times a day (BID) | ORAL | 0 refills | Status: AC

## 2023-10-21 MED ORDER — MIRABEGRON ER 50 MG PO TB24: 50.0000 mg | ORAL_TABLET | Freq: Every day | ORAL | 11 refills | Status: AC

## 2023-10-21 NOTE — Progress Notes (Signed)
 Assessment: 1. Urge incontinence   2. History of UTI   3. Abnormal urine findings     Plan: Urine culture sent today. Begin Bactrim  DS twice daily x 5 days for treatment of possible UTI. Continue bladder diet  Discontinue solifenacin  due to lack of effectiveness. Trial of Myrbetriq  50 mg daily.  Prescription sent. Return to office in 4-6 weeks for reevaluation. May need to consider low-dose antibiotic prophylaxis given frequent UTIs.   Chief Complaint:  Chief Complaint  Patient presents with   Urinary Incontinence    History of Present Illness:  [name] is a 78 y.o. female who is seen for further evaluation of urinary incontinence. At her initial visit in August 2025, she reported symptoms of frequency, urgency, nocturia, urge incontinence, and unaware incontinence at night.  She had symptoms for at least 3 months with some recent worsening.  She was using adult diapers/pads throughout the day for her incontinence.  No dysuria or gross hematuria.  She reported that she was recently treated for UTI diagnosed at urgent care.  No culture results available. She does not have problems with constipation.  She actually has frequent loose bowel movements associated with her IBS. She previously saw Dr. MacDiarmid with [facility].  Her last visit was in June 2018.  Review of the chart suggest improvement of her frequency and urge incontinence with Myrbetriq  25 mg.  She was also seen for chronic cystitis.  Urine culture from 09/09/2023 grew >100 K Klebsiella.  Treated with cefdinir  x 7 days. She was given a trial of solifenacin  10 mg daily in August 2025.  She returns today for follow-up. She continues on solifenacin  10 mg daily.  She continues to have symptoms of frequency and urgency as well as urge incontinence and unaware incontinence.  She continues to require adult diapers throughout the day.  No side effects from the solifenacin .  No dysuria or gross hematuria.  Portions  of the above documentation were copied from a prior visit for review purposes only.   Past Medical History:  Past Medical History:  Diagnosis Date   Allergy    Anemia    Anxiety    Barrett's esophagus    Bilateral carpal tunnel syndrome    Dr. Deward, having injection therapy   C. difficile diarrhea 08/01/2012   severe 2014   Carotid arterial disease (HCC)    Cataract    removed both eyes    Chronic combined systolic and diastolic CHF (congestive heart failure) (HCC)    Chronic cystitis    Dr. Gaston   Chronic lower back pain    Chronic pain syndrome    Depression    Dizziness    DJD (degenerative joint disease)    bilateral hands; knees   Family history of anesthesia complication    Mother had severe N/V   Fibromyalgia    GERD (gastroesophageal reflux disease)    H/O cardiac catheterization    (-) cath 12-2002  , cath again 2011 (-)   History of colon polyps    History of nuclear stress test    Myoview  3/23: EF 51, no ischemia; low risk (echocardiogram in 11/22 with normal EF)   HTN (hypertension)    Hyperlipidemia    past hx    IBS (irritable bowel syndrome)    Insomnia    Migraine    Morbid obesity (HCC)    Non-ischemic cardiomyopathy (HCC)    Osteoporosis    pt unsure of this   RLS (  restless legs syndrome)    Vertigo    Vitamin B 12 deficiency 04/09/2013    Past Surgical History:  Past Surgical History:  Procedure Laterality Date   Arm surgery Left    don't remember what they did; arm wasn't broken   BILATERAL KNEE ARTHROSCOPY Bilateral    BIOPSY  08/07/2018   Procedure: BIOPSY;  Surgeon: Aneita Gwendlyn DASEN, MD;  Location: [facility];  Service: Endoscopy;;   CARDIAC CATHETERIZATION  01/22/10   clean cath   CATARACT EXTRACTION W/ INTRAOCULAR LENS  IMPLANT, BILATERAL Bilateral    CHOLECYSTECTOMY N/A 10/25/2016   Procedure: LAPAROSCOPIC CHOLECYSTECTOMY;  Surgeon: Sebastian Moles, MD;  Location: [facility] OR;  Service: General;  Laterality: N/A;   COLONOSCOPY      COLONOSCOPY W/ POLYPECTOMY     ESOPHAGOGASTRODUODENOSCOPY (EGD) WITH PROPOFOL  N/A 08/07/2018   Procedure: ESOPHAGOGASTRODUODENOSCOPY (EGD) WITH PROPOFOL ;  Surgeon: Aneita Gwendlyn DASEN, MD;  Location: [facility];  Service: Endoscopy;  Laterality: N/A;   FOOT SURGERY Bilateral    toenails removed; callus removed on right; hammertoes right   Knot     removed from right neck; not a goiter   LUMBAR DISC SURGERY     L5 S1 anterior fusion   OOPHORECTOMY     RIGHT/LEFT HEART CATH AND CORONARY ANGIOGRAPHY N/A 05/06/2017   Procedure: RIGHT/LEFT HEART CATH AND CORONARY ANGIOGRAPHY;  Surgeon: Mady Bruckner, MD;  Location: [facility];  Service: Cardiovascular;  Laterality: N/A;   SHOULDER ARTHROSCOPY Right    TOTAL KNEE ARTHROPLASTY  10/05/2011   Procedure: TOTAL KNEE ARTHROPLASTY;  Surgeon: Maude KANDICE Herald, MD;  Location: [facility] OR;  Service: Orthopedics;  Laterality: Right;   UPPER GASTROINTESTINAL ENDOSCOPY     VAGINAL HYSTERECTOMY     for endometriosis    Allergies:  Allergies  Allergen Reactions   Dextromethorphan-Guaifenesin Nausea And Vomiting   Morphine Nausea And Vomiting   Gabapentin Other (See Comments)    Dizziness, made her feel loopy   Penicillins Rash    > 30 years ago; best I can remember it was just a light rash on my arm Did it involve swelling of the face/tongue/throat, SOB, or low BP? No Did it involve sudden or severe rash/hives, skin peeling, or any reaction on the inside of your mouth or nose? Unknown Did you need to seek medical attention at a hospital or doctor's office? No When did it last happen?      more than 30 years  If all above answers are "NO", may proceed with cephalosporin use.     Family History:  Family History  Problem Relation Age of Onset   Lung cancer Mother    Dementia Father    CAD Father        dx in his 72s   Stroke Father    Congestive Heart Failure Father    Colon cancer Maternal Grandfather    Esophageal cancer Brother     Breast cancer Sister    Diabetes Other        Aunt   Colon cancer Maternal Uncle        2   Hepatitis C Brother    Rectal cancer Neg Hx    Stomach cancer Neg Hx    Colon polyps Neg Hx     Social History:  Social History   Tobacco Use   Smoking status: Former    Current packs/day: 0.00    Average packs/day: 0.5 packs/day for 10.0 years (5.0 ttl pk-yrs)    Types: Cigarettes  Start date: 02/08/1965    Quit date: 02/09/1975    Years since quitting: 48.7   Smokeless tobacco: Never   Tobacco comments:    started at age 89.   quit in the 11s.  Vaping Use   Vaping status: Never Used  Substance Use Topics   Alcohol use: Not Currently   Drug use: No    Comment: CBD and hemp OIL     ROS: Constitutional:  Negative for fever, chills, weight loss CV: Negative for chest pain, previous MI, hypertension Respiratory:  Negative for shortness of breath, wheezing, sleep apnea, frequent cough GI:  Negative for nausea, vomiting, bloody stool, GERD  Physical exam: BP 120/67   Pulse 70   Ht 5' 1 (1.549 m)   Wt 250 lb (113.4 kg)   BMI 47.24 kg/m  GENERAL APPEARANCE:  Well appearing, well developed, well nourished, NAD HEENT:  Atraumatic, normocephalic, oropharynx clear NECK:  Supple without lymphadenopathy or thyromegaly ABDOMEN:  Soft, non-tender, no masses EXTREMITIES:  Moves all extremities well, without clubbing, cyanosis, or edema NEUROLOGIC:  Alert and oriented x 3, normal gait, CN II-XII grossly intact MENTAL STATUS:  appropriate BACK:  Non-tender to palpation, No CVAT SKIN:  Warm, dry, and intact  Results: U/A: >30 WBC, 3-10 RBC, many bacteria, nitrite +

## 2023-10-27 ENCOUNTER — Ambulatory Visit: Payer: Self-pay | Admitting: Urology

## 2023-10-27 LAB — URINE CULTURE

## 2023-11-03 DIAGNOSIS — M545 Low back pain, unspecified: Secondary | ICD-10-CM | POA: Diagnosis not present

## 2023-11-04 ENCOUNTER — Ambulatory Visit: Payer: Self-pay

## 2023-11-04 DIAGNOSIS — N390 Urinary tract infection, site not specified: Secondary | ICD-10-CM | POA: Diagnosis not present

## 2023-11-04 NOTE — Telephone Encounter (Signed)
 FYI. Pt going to UC in [place].

## 2023-11-04 NOTE — Telephone Encounter (Signed)
 FYI Only or Action Required?: FYI only for provider.  Patient was last seen in primary care on 09/30/2023 by Amon Aloysius BRAVO, MD.  Called Nurse Triage reporting Altered mental status.  Pt is having hallucinations. Pt also fell yesterday and was taken to UC.  Symptoms began several days ago.  Interventions attempted: Nothing.  Symptoms are: gradually worsening.  Triage Disposition: See HCP Within 4 Hours (Or PCP Triage)  Patient/caregiver understands and will follow disposition?: Yes - Pt is in [place] right now will go to UC. Appt scheduled for October.                      Copied from CRM #[id number]. Topic: Clinical - Red Word Triage >> Nov 04, 2023  9:18 AM Suzen RAMAN wrote: Red Word that prompted transfer to Nurse Triage: confusion,hallucinations,recent fall was seen at [facility]. Patient son on the line. Reason for Disposition  [1] Longstanding confusion (e.g., dementia, stroke) AND [2] getting worse  Answer Assessment - Initial Assessment Questions 1. LEVEL OF CONSCIOUSNESS: How are they (the patient) acting right now? (e.g., alert-oriented, confused, lethargic, stuporous, comatose)     Very confused 2. ONSET: When did the confusion start?  (e.g., minutes, hours, days)     3 days 3. PATTERN: Does this come and go, or has it been constant since it started?  Is it present now?     constant 4. ALCOHOL or DRUGS: Have they been drinking alcohol or taking any drugs?      no 5. NARCOTIC MEDICINES: Have they been receiving any narcotic medications? (e.g., morphine, Vicodin)     no 6. CAUSE: What do you think is causing the confusion?      unsure 7. OTHER SYMPTOMS: Are there any other symptoms? (e.g., difficulty breathing, fever, headache, weakness)     Fell yesterday  Protocols used: Confusion - Delirium-A-AH

## 2023-11-10 ENCOUNTER — Other Ambulatory Visit: Payer: Self-pay | Admitting: Internal Medicine

## 2023-11-14 ENCOUNTER — Ambulatory Visit: Payer: Self-pay | Admitting: Internal Medicine

## 2023-11-14 NOTE — Telephone Encounter (Signed)
 FYI Only or Action Required?: FYI only for provider.  Patient was last seen in primary care on 09/30/2023 by Amon Aloysius BRAVO, MD.  Called Nurse Triage reporting Fall.  Symptoms began several days ago.  Interventions attempted: OTC medications: Ibuprofen.  Triage Disposition: See Physician Within 24 Hours (overriding See PCP When Office is Open (Within 3 Days))  Patient/caregiver understands and will follow disposition?: Yes              Copied from CRM [id number]. Topic: Clinical - Red Word Triage >> Nov 14, 2023  9:41 AM Maisie BROCKS wrote: Red Word that prompted transfer to Nurse Triage: fell on a rock last Thursday, severe back pain Reason for Disposition  MILD weakness (e.g., does not interfere with ability to work, go to school, normal activities)  (Exception: Mild weakness is a chronic symptom.)  Answer Assessment - Initial Assessment Questions This RN spoke with pt's son, Ozell. The EMS came after the fall last Thursday and told her to go to the urgent care. Pt went to urgent care but she did not have a scan of her back. Urgent care told her to go home after. This RN scheduled pt for appt tomorrow after 4 PM as pt son can only take her after work. This RN educated pt son on new-worsening symptoms and when to call back/seek emergent care. Pt son verbalized understanding and agrees to plan.   Denies losing consciousness, hit head but not really (there was a knot that is gone)   MECHANISM: How did the fall happen?     Lost balance and fell backwards onto a rock  ONSET: When did the fall happen? (e.g., minutes, hours, or days ago)     Thursday  INJURY: Did you hurt (injure) yourself when you fell? If Yes, ask: What did you injure? Tell me more about this? (e.g., body area; type of injury; pain severity)     Cut on hand  PAIN: Is there any pain? If Yes, ask: How bad is the pain? (e.g., Scale 0-10; or none, mild     Back pain burns, probably a 10 per pt  son  OTHER SYMPTOMS: Do you have any other symptoms? (e.g., dizziness, fever, weakness; new-onset or worsening).      Denies  Protocols used: Falls and [facility]

## 2023-11-14 NOTE — Telephone Encounter (Signed)
 Appt scheduled

## 2023-11-15 ENCOUNTER — Ambulatory Visit (INDEPENDENT_AMBULATORY_CARE_PROVIDER_SITE_OTHER): Admitting: Family

## 2023-11-15 VITALS — BP 100/78 | HR 88 | Temp 98.8°F | Resp 16 | Ht 61.0 in | Wt 248.0 lb

## 2023-11-15 DIAGNOSIS — R4189 Other symptoms and signs involving cognitive functions and awareness: Secondary | ICD-10-CM | POA: Diagnosis not present

## 2023-11-15 DIAGNOSIS — M549 Dorsalgia, unspecified: Secondary | ICD-10-CM

## 2023-11-15 DIAGNOSIS — S0990XA Unspecified injury of head, initial encounter: Secondary | ICD-10-CM | POA: Diagnosis not present

## 2023-11-15 DIAGNOSIS — W19XXXA Unspecified fall, initial encounter: Secondary | ICD-10-CM

## 2023-11-15 NOTE — Patient Instructions (Addendum)
 Please call Radiology in the AM to get her CT and x-rays scheduled.  [phone].

## 2023-11-15 NOTE — Progress Notes (Unsigned)
 Subjective:     Patient ID: [name], female    DOB: [birth year], 78 y.o.   MRN: [id number]  Chief Complaint  Patient presents with   Fall    Patient had a fall 2 weeks ago. Was seen at [facility] but no imagine done.     HPI  Discussed the use of AI scribe software for clinical note transcription with the patient, who gave verbal consent to proceed.  History of Present Illness  [name] is a 78 year old female who presents today with her son.  Son reports that two weeks ago, she fell while walking outside, losing her balance on a slope and landing on a pointed rock. This resulted in her back bending and her head hitting the ground. She has persistent pain in her back and hips, worsened by walking. A bruise and initial knot on her head have resolved. She visited urgent care, but no imaging was performed. Ibuprofen 800 mg was prescribed but did not relieve her symptoms.  She does not have a headache today.  She has a hx of dementia which her son says remains at baseline. He is requesting referral to a different neurologist for her memory.    Health Maintenance Due  Topic Date Due   Zoster Vaccines- Shingrix  (1 of 2) 09/30/1963   DTaP/Tdap/Td (3 - Tdap) 04/13/2020    Past Medical History:  Diagnosis Date   Allergy    Anemia    Anxiety    Barrett's esophagus    Bilateral carpal tunnel syndrome    Dr. Deward, having injection therapy   C. difficile diarrhea 08/01/2012   severe 2014   Carotid arterial disease    Cataract    removed both eyes    Chronic combined systolic and diastolic CHF (congestive heart failure) (HCC)    Chronic cystitis    Dr. Gaston   Chronic lower back pain    Chronic pain syndrome    Depression    Dizziness    DJD (degenerative joint disease)    bilateral hands; knees   Family history of anesthesia complication    Mother had severe N/V   Fibromyalgia    GERD (gastroesophageal reflux disease)    H/O cardiac catheterization    (-) cath  12-2002  , cath again 2011 (-)   History of colon polyps    History of nuclear stress test    Myoview  3/23: EF 51, no ischemia; low risk (echocardiogram in 11/22 with normal EF)   HTN (hypertension)    Hyperlipidemia    past hx    IBS (irritable bowel syndrome)    Insomnia    Migraine    Morbid obesity (HCC)    Non-ischemic cardiomyopathy (HCC)    Osteoporosis    pt unsure of this   RLS (restless legs syndrome)    Vertigo    Vitamin B 12 deficiency 04/09/2013    Past Surgical History:  Procedure Laterality Date   Arm surgery Left    don't remember what they did; arm wasn't broken   BILATERAL KNEE ARTHROSCOPY Bilateral    BIOPSY  08/07/2018   Procedure: BIOPSY;  Surgeon: Aneita Gwendlyn DASEN, MD;  Location: [facility];  Service: Endoscopy;;   CARDIAC CATHETERIZATION  01/22/10   clean cath   CATARACT EXTRACTION W/ INTRAOCULAR LENS  IMPLANT, BILATERAL Bilateral    CHOLECYSTECTOMY N/A 10/25/2016   Procedure: LAPAROSCOPIC CHOLECYSTECTOMY;  Surgeon: Sebastian Moles, MD;  Location: [facility] OR;  Service: General;  Laterality:  N/A;   COLONOSCOPY     COLONOSCOPY W/ POLYPECTOMY     ESOPHAGOGASTRODUODENOSCOPY (EGD) WITH PROPOFOL  N/A 08/07/2018   Procedure: ESOPHAGOGASTRODUODENOSCOPY (EGD) WITH PROPOFOL ;  Surgeon: Aneita Gwendlyn DASEN, MD;  Location: [facility];  Service: Endoscopy;  Laterality: N/A;   FOOT SURGERY Bilateral    toenails removed; callus removed on right; hammertoes right   Knot     removed from right neck; not a goiter   LUMBAR DISC SURGERY     L5 S1 anterior fusion   OOPHORECTOMY     RIGHT/LEFT HEART CATH AND CORONARY ANGIOGRAPHY N/A 05/06/2017   Procedure: RIGHT/LEFT HEART CATH AND CORONARY ANGIOGRAPHY;  Surgeon: Mady Bruckner, MD;  [facility]: [facility];  Service: Cardiovascular;  Laterality: N/A;   SHOULDER ARTHROSCOPY Right    TOTAL KNEE ARTHROPLASTY  10/05/2011   Procedure: TOTAL KNEE ARTHROPLASTY;  Surgeon: Maude KANDICE Herald, MD;  Location: [facility];  Service:  Orthopedics;  Laterality: Right;   UPPER GASTROINTESTINAL ENDOSCOPY     VAGINAL HYSTERECTOMY     for endometriosis    Family History  Problem Relation Age of Onset   Lung cancer Mother    Dementia Father    CAD Father        dx in his 83s   Stroke Father    Congestive Heart Failure Father    Colon cancer Maternal Grandfather    Esophageal cancer Brother    Breast cancer Sister    Diabetes Other        Aunt   Colon cancer Maternal Uncle        2   Hepatitis C Brother    Rectal cancer Neg Hx    Stomach cancer Neg Hx    Colon polyps Neg Hx     Social History   Socioeconomic History   Marital status: Divorced    Spouse name: Not on file   Number of children: 2   Years of education: 12   Highest education level: 12th grade  Occupational History   Occupation: Retired, post Sales promotion account executive: RETIRED  Tobacco Use   Smoking status: Former    Current packs/day: 0.00    Average packs/day: 0.5 packs/day for 10.0 years (5.0 ttl pk-yrs)    Types: Cigarettes    Start date: 02/08/1965    Quit date: 02/09/1975    Years since quitting: 48.8   Smokeless tobacco: Never   Tobacco comments:    started at age 69.   quit in the 15s.  Vaping Use   Vaping status: Never Used  Substance and Sexual Activity   Alcohol use: Not Currently   Drug use: No    Comment: CBD and hemp OIL    Sexual activity: Not Currently    Partners: Male  Other Topics Concern   Not on file  Social History Narrative   Son lives with her.   Right-handed.   2 soft drinks per day.   Social Drivers of Corporate investment banker Strain: Low Risk  (09/07/2023)   Overall Financial Resource Strain (CARDIA)    Difficulty of Paying Living Expenses: Not hard at all  Food Insecurity: No Food Insecurity (09/07/2023)   Hunger Vital Sign    Worried About Running Out of Food in the Last Year: Never true    Ran Out of Food in the Last Year: Never true  Transportation Needs: No Transportation Needs (09/07/2023)    PRAPARE - Administrator, Civil Service (Medical): No  Lack of Transportation (Non-Medical): No  Physical Activity: Patient Declined (09/07/2023)   Exercise Vital Sign    Days of Exercise per Week: Patient declined    Minutes of Exercise per Session: Patient declined  Stress: No Stress Concern Present (09/07/2023)   [employer] of Occupational Health - Occupational Stress Questionnaire    Feeling of Stress: Not at all  Social Connections: Socially Isolated (09/07/2023)   Social Connection and Isolation Panel    Frequency of Communication with Friends and Family: More than three times a week    Frequency of Social Gatherings with Friends and Family: Once a week    Attends Religious Services: Never    Database administrator or Organizations: No    Attends Banker Meetings: Never    Marital Status: Divorced  Catering manager Violence: Not At Risk (09/07/2023)   Humiliation, Afraid, Rape, and Kick questionnaire    Fear of Current or Ex-Partner: No    Emotionally Abused: No    Physically Abused: No    Sexually Abused: No    Outpatient Medications Prior to Visit  Medication Sig Dispense Refill   albuterol  (VENTOLIN  HFA) 108 (90 Base) MCG/ACT inhaler TAKE 2 PUFFS BY MOUTH EVERY 6 HOURS AS NEEDED FOR WHEEZE OR SHORTNESS OF BREATH 18 each 5   aspirin  EC 81 MG tablet Take 81 mg by mouth daily. Swallow whole.     Cholecalciferol  (VITAMIN D ) 125 MCG (5000 UT) CAPS Take 5,000 Units by mouth daily.     Cyanocobalamin  (VITAMIN B12) 1000 MCG TBCR Take 1 tablet by mouth daily.     dicyclomine  (BENTYL ) 20 MG tablet Take 1 tablet (20 mg total) by mouth 4 (four) times daily -  before meals and at bedtime. 360 tablet 3   HYDROcodone -acetaminophen  (NORCO) 10-325 MG tablet Take 1 tablet by mouth every 6 (six) hours as needed.     loperamide  (IMODIUM ) 2 MG capsule Patient takes 2-4 tablets by mouth as needed for diarrhea or loose stools     metoprolol  succinate (TOPROL -XL) 50  MG 24 hr tablet TAKE 1 TABLET BY MOUTH EVERY DAY WITH OR IMMEDIATELY FOLLOWING A MEAL 90 tablet 3   mirabegron  ER (MYRBETRIQ ) 50 MG TB24 tablet Take 1 tablet (50 mg total) by mouth daily. 30 tablet 11   NARCAN 4 MG/0.1ML LIQD nasal spray kit Place 1 spray into the nose once as needed (opioid od).     nitroGLYCERIN  (NITROSTAT ) 0.4 MG SL tablet Place 1 tablet (0.4 mg total) under the tongue every 5 (five) minutes as needed for chest pain. 90 tablet 3   omeprazole  (PRILOSEC) 40 MG capsule Take 1 capsule (40 mg total) by mouth 2 (two) times daily. 180 capsule 3   ondansetron  (ZOFRAN ) 4 MG tablet Take 1 tablet (4 mg total) by mouth 2 (two) times daily as needed for nausea or vomiting. 60 tablet 1   Propylene Glycol 0.6 % SOLN Apply 2 drops to eye as needed (for dry eyes).     rOPINIRole  (REQUIP ) 1 MG tablet Take 1 tablet (1 mg total) by mouth 2 (two) times daily. 180 tablet 0   rosuvastatin  (CRESTOR ) 40 MG tablet Take 1 tablet (40 mg total) by mouth at bedtime. 90 tablet 1   sacubitril -valsartan  (ENTRESTO ) 97-103 MG TAKE 1 TABLET TWICE A DAY 180 tablet 2   Vitamin D , Ergocalciferol , (DRISDOL ) 1.25 MG (50000 UNIT) CAPS capsule Take 1 capsule (50,000 Units total) by mouth every 7 (seven) days. 12 capsule 0   No  facility-administered medications prior to visit.    Allergies  Allergen Reactions   Dextromethorphan-Guaifenesin Nausea And Vomiting   Morphine Nausea And Vomiting   Gabapentin Other (See Comments)    Dizziness, made her feel loopy   Penicillins Rash    > 30 years ago; best I can remember it was just a light rash on my arm Did it involve swelling of the face/tongue/throat, SOB, or low BP? No Did it involve sudden or severe rash/hives, skin peeling, or any reaction on the inside of your mouth or nose? Unknown Did you need to seek medical attention at a hospital or doctor's office? No When did it last happen?      more than 30 years  If all above answers are "NO", may proceed with  cephalosporin use.     ROS     Objective:    Physical Exam Constitutional:      General: She is not in acute distress.    Appearance: Normal appearance. She is well-developed.  HENT:     Head: Normocephalic and atraumatic.     Right Ear: External ear normal.     Left Ear: External ear normal.  Eyes:     General: No scleral icterus. Neck:     Thyroid : No thyromegaly.  Cardiovascular:     Rate and Rhythm: Normal rate and regular rhythm.     Heart sounds: Normal heart sounds. No murmur heard. Pulmonary:     Effort: Pulmonary effort is normal. No respiratory distress.     Breath sounds: Normal breath sounds. No wheezing.  Musculoskeletal:     Cervical back: Neck supple.  Skin:    General: Skin is warm and dry.  Neurological:     General: No focal deficit present.     Mental Status: She is alert. Mental status is at baseline.  Psychiatric:        Mood and Affect: Mood normal.        Behavior: Behavior normal.        Thought Content: Thought content normal.        Judgment: Judgment normal.      BP 100/78 (BP Location: Right Arm, Patient Position: Sitting, Cuff Size: Large)   Pulse 88   Temp 98.8 F (37.1 C) (Oral)   Resp 16   Ht 5' 1 (1.549 m)   Wt 248 lb (112.5 kg)   SpO2 98%   BMI 46.86 kg/m  Wt Readings from Last 3 Encounters:  11/15/23 248 lb (112.5 kg)  10/21/23 250 lb (113.4 kg)  09/30/23 254 lb 2 oz (115.3 kg)       Assessment & Plan:   Problem List Items Addressed This Visit       Unprioritized   Cognitive impairment   Refer to Camie Sevin- PA-C for additional memory evaluation.       Relevant Orders   Ambulatory referral to Neurology   Other Visit Diagnoses       Fall, initial encounter    -  Primary   Relevant Orders   CT HEAD WO CONTRAST ( )   DG HIPS BILAT WITH PELVIS 3-4 VIEWS     Back pain, unspecified back location, unspecified back pain laterality, unspecified chronicity       Relevant Orders   DG Thoracic Spine 2 View    DG Lumbar Spine Complete     Traumatic injury of head, initial encounter       Relevant Orders   CT HEAD WO CONTRAST ( )  Need to rule out fractures as above as well as brain bleed (less likely due to no change in mental status but has not been evaluated yet).  I am having Ronal HERO. Beavers maintain her Vitamin D , Narcan, HYDROcodone -acetaminophen , Vitamin B12, loperamide , Propylene Glycol, nitroGLYCERIN , dicyclomine , omeprazole , albuterol , aspirin  EC, metoprolol  succinate, ondansetron , Entresto , rosuvastatin , Vitamin D  (Ergocalciferol ), mirabegron  ER, and rOPINIRole .  No orders of the defined types were placed in this encounter.

## 2023-11-16 NOTE — Assessment & Plan Note (Signed)
 Refer to Camie Sevin- PA-C for additional memory evaluation.

## 2023-11-17 ENCOUNTER — Ambulatory Visit (HOSPITAL_BASED_OUTPATIENT_CLINIC_OR_DEPARTMENT_OTHER)

## 2023-11-18 ENCOUNTER — Ambulatory Visit (HOSPITAL_BASED_OUTPATIENT_CLINIC_OR_DEPARTMENT_OTHER)
Admission: RE | Admit: 2023-11-18 | Discharge: 2023-11-18 | Disposition: A | Discharge status: Home / Self Care | Source: Ambulatory Visit | Attending: Family | Admitting: Family

## 2023-11-18 ENCOUNTER — Encounter: Payer: Self-pay | Admitting: Internal Medicine

## 2023-11-18 ENCOUNTER — Ambulatory Visit: Payer: Self-pay | Admitting: Family

## 2023-11-18 ENCOUNTER — Ambulatory Visit: Admitting: Internal Medicine

## 2023-11-18 DIAGNOSIS — M47814 Spondylosis without myelopathy or radiculopathy, thoracic region: Secondary | ICD-10-CM | POA: Diagnosis not present

## 2023-11-18 DIAGNOSIS — M4187 Other forms of scoliosis, lumbosacral region: Secondary | ICD-10-CM | POA: Diagnosis not present

## 2023-11-18 DIAGNOSIS — W19XXXA Unspecified fall, initial encounter: Secondary | ICD-10-CM | POA: Diagnosis not present

## 2023-11-18 DIAGNOSIS — S0990XA Unspecified injury of head, initial encounter: Secondary | ICD-10-CM | POA: Insufficient documentation

## 2023-11-18 DIAGNOSIS — G319 Degenerative disease of nervous system, unspecified: Secondary | ICD-10-CM | POA: Insufficient documentation

## 2023-11-18 DIAGNOSIS — M4804 Spinal stenosis, thoracic region: Secondary | ICD-10-CM | POA: Diagnosis not present

## 2023-11-18 DIAGNOSIS — M47816 Spondylosis without myelopathy or radiculopathy, lumbar region: Secondary | ICD-10-CM | POA: Diagnosis not present

## 2023-11-18 DIAGNOSIS — Z4789 Encounter for other orthopedic aftercare: Secondary | ICD-10-CM | POA: Diagnosis not present

## 2023-11-18 DIAGNOSIS — M549 Dorsalgia, unspecified: Secondary | ICD-10-CM | POA: Insufficient documentation

## 2023-11-18 DIAGNOSIS — M160 Bilateral primary osteoarthritis of hip: Secondary | ICD-10-CM | POA: Diagnosis not present

## 2023-11-27 DIAGNOSIS — Z885 Allergy status to narcotic agent status: Secondary | ICD-10-CM | POA: Diagnosis not present

## 2023-11-27 DIAGNOSIS — Z888 Allergy status to other drugs, medicaments and biological substances status: Secondary | ICD-10-CM | POA: Diagnosis not present

## 2023-11-27 DIAGNOSIS — G459 Transient cerebral ischemic attack, unspecified: Secondary | ICD-10-CM | POA: Diagnosis not present

## 2023-11-27 DIAGNOSIS — I6623 Occlusion and stenosis of bilateral posterior cerebral arteries: Secondary | ICD-10-CM | POA: Diagnosis not present

## 2023-11-27 DIAGNOSIS — Z87891 Personal history of nicotine dependence: Secondary | ICD-10-CM | POA: Diagnosis not present

## 2023-11-27 DIAGNOSIS — I6523 Occlusion and stenosis of bilateral carotid arteries: Secondary | ICD-10-CM | POA: Diagnosis not present

## 2023-11-27 DIAGNOSIS — I509 Heart failure, unspecified: Secondary | ICD-10-CM | POA: Diagnosis not present

## 2023-11-27 DIAGNOSIS — R109 Unspecified abdominal pain: Secondary | ICD-10-CM | POA: Diagnosis not present

## 2023-11-27 DIAGNOSIS — R531 Weakness: Secondary | ICD-10-CM | POA: Diagnosis not present

## 2023-11-27 DIAGNOSIS — Z7982 Long term (current) use of aspirin: Secondary | ICD-10-CM | POA: Diagnosis not present

## 2023-11-27 DIAGNOSIS — I110 Hypertensive heart disease with heart failure: Secondary | ICD-10-CM | POA: Diagnosis not present

## 2023-11-27 DIAGNOSIS — R4182 Altered mental status, unspecified: Secondary | ICD-10-CM | POA: Diagnosis not present

## 2023-11-27 DIAGNOSIS — Z880 Allergy status to penicillin: Secondary | ICD-10-CM | POA: Diagnosis not present

## 2023-11-27 DIAGNOSIS — G3184 Mild cognitive impairment, so stated: Secondary | ICD-10-CM | POA: Diagnosis not present

## 2023-11-27 DIAGNOSIS — Z8673 Personal history of transient ischemic attack (TIA), and cerebral infarction without residual deficits: Secondary | ICD-10-CM | POA: Diagnosis not present

## 2023-11-27 DIAGNOSIS — M1990 Unspecified osteoarthritis, unspecified site: Secondary | ICD-10-CM | POA: Diagnosis not present

## 2023-12-02 ENCOUNTER — Ambulatory Visit: Admitting: Urology

## 2023-12-02 ENCOUNTER — Encounter: Payer: Self-pay | Admitting: Urology

## 2023-12-02 VITALS — BP 144/65 | HR 85 | Ht 61.0 in | Wt 248.0 lb

## 2023-12-02 DIAGNOSIS — N3941 Urge incontinence: Secondary | ICD-10-CM | POA: Diagnosis not present

## 2023-12-02 DIAGNOSIS — Z87440 Personal history of urinary (tract) infections: Secondary | ICD-10-CM

## 2023-12-02 LAB — URINALYSIS, ROUTINE W REFLEX MICROSCOPIC
Bilirubin, UA: NEGATIVE
Glucose, UA: NEGATIVE
Ketones, UA: NEGATIVE
Leukocytes,UA: NEGATIVE
Nitrite, UA: NEGATIVE
RBC, UA: NEGATIVE
Specific Gravity, UA: 1.015 (ref 1.005–1.030)
Urobilinogen, Ur: 0.2 mg/dL (ref 0.2–1.0)
pH, UA: 7 (ref 5.0–7.5)

## 2023-12-02 LAB — MICROSCOPIC EXAMINATION

## 2023-12-02 MED ORDER — CEPHALEXIN 250 MG PO CAPS: 250.0000 mg | ORAL_CAPSULE | Freq: Every day | ORAL | 3 refills | Status: DC

## 2023-12-02 NOTE — Progress Notes (Signed)
 Assessment: 1. Urge incontinence   2. History of UTI     Plan: Continue bladder diet  Continue Myrbetriq  50 mg daily.   Recommend cephalexin  250 mg daily for UTI prevention Return to office in 2 months  Chief Complaint:  Chief Complaint  Patient presents with   Urinary Incontinence    History of Present Illness:  [name] is a 78 y.o. female who is seen for further evaluation of urinary incontinence. At her initial visit in August 2025, she reported symptoms of frequency, urgency, nocturia, urge incontinence, and unaware incontinence at night.  She had symptoms for at least 3 months with some recent worsening.  She was using adult diapers/pads throughout the day for her incontinence.  No dysuria or gross hematuria.  She reported that she was recently treated for UTI diagnosed at urgent care.  No culture results available. She does not have problems with constipation.  She actually has frequent loose bowel movements associated with her IBS. She previously saw Dr. MacDiarmid with [facility].  Her last visit was in June 2018.  Review of the chart suggest improvement of her frequency and urge incontinence with Myrbetriq  25 mg.  She was also seen for chronic cystitis.  Urine culture from 09/09/2023 grew >100 K Klebsiella.  Treated with cefdinir  x 7 days. She was given a trial of solifenacin  10 mg daily in August 2025.  At her visit in September 2025, she continued on solifenacin  10 mg daily.  She continued to have symptoms of frequency and urgency as well as urge incontinence and unaware incontinence.  She continued to require adult diapers throughout the day.  No side effects from the solifenacin .  No dysuria or gross hematuria. Urine culture grew >100 K Klebsiella.  Treated with Bactrim  x 5 days. She was also changed to Myrbetriq  50 mg daily.  She returns today for follow-up.  She has noted improvement in her incontinence symptoms with Myrbetriq  50 mg daily.  She has done  very well.  She did have an episode of incontinence last night.  No dysuria or gross hematuria.  She reports that she was diagnosed with a UTI while vacationing in [place] last month.  Portions of the above documentation were copied from a prior visit for review purposes only.   Past Medical History:  Past Medical History:  Diagnosis Date   Allergy    Anemia    Anxiety    Barrett's esophagus    Bilateral carpal tunnel syndrome    Dr. Deward, having injection therapy   C. difficile diarrhea 08/01/2012   severe 2014   Carotid arterial disease    Cataract    removed both eyes    Chronic combined systolic and diastolic CHF (congestive heart failure) (HCC)    Chronic cystitis    Dr. Gaston   Chronic lower back pain    Chronic pain syndrome    Depression    Dizziness    DJD (degenerative joint disease)    bilateral hands; knees   Family history of anesthesia complication    Mother had severe N/V   Fibromyalgia    GERD (gastroesophageal reflux disease)    H/O cardiac catheterization    (-) cath 12-2002  , cath again 2011 (-)   History of colon polyps    History of nuclear stress test    Myoview  3/23: EF 51, no ischemia; low risk (echocardiogram in 11/22 with normal EF)   HTN (hypertension)    Hyperlipidemia  past hx    IBS (irritable bowel syndrome)    Insomnia    Migraine    Morbid obesity (HCC)    Non-ischemic cardiomyopathy (HCC)    Osteoporosis    pt unsure of this   RLS (restless legs syndrome)    Vertigo    Vitamin B 12 deficiency 04/09/2013    Past Surgical History:  Past Surgical History:  Procedure Laterality Date   Arm surgery Left    don't remember what they did; arm wasn't broken   BILATERAL KNEE ARTHROSCOPY Bilateral    BIOPSY  08/07/2018   Procedure: BIOPSY;  Surgeon: Aneita Gwendlyn DASEN, MD;  Location: [facility];  Service: Endoscopy;;   CARDIAC CATHETERIZATION  01/22/10   clean cath   CATARACT EXTRACTION W/ INTRAOCULAR LENS  IMPLANT,  BILATERAL Bilateral    CHOLECYSTECTOMY N/A 10/25/2016   Procedure: LAPAROSCOPIC CHOLECYSTECTOMY;  Surgeon: Sebastian Moles, MD;  Location: [facility] OR;  Service: General;  Laterality: N/A;   COLONOSCOPY     COLONOSCOPY W/ POLYPECTOMY     ESOPHAGOGASTRODUODENOSCOPY (EGD) WITH PROPOFOL  N/A 08/07/2018   Procedure: ESOPHAGOGASTRODUODENOSCOPY (EGD) WITH PROPOFOL ;  Surgeon: Aneita Gwendlyn DASEN, MD;  Location: [facility];  Service: Endoscopy;  Laterality: N/A;   FOOT SURGERY Bilateral    toenails removed; callus removed on right; hammertoes right   Knot     removed from right neck; not a goiter   LUMBAR DISC SURGERY     L5 S1 anterior fusion   OOPHORECTOMY     RIGHT/LEFT HEART CATH AND CORONARY ANGIOGRAPHY N/A 05/06/2017   Procedure: RIGHT/LEFT HEART CATH AND CORONARY ANGIOGRAPHY;  Surgeon: Mady Bruckner, MD;  Location: [facility];  Service: Cardiovascular;  Laterality: N/A;   SHOULDER ARTHROSCOPY Right    TOTAL KNEE ARTHROPLASTY  10/05/2011   Procedure: TOTAL KNEE ARTHROPLASTY;  Surgeon: Maude KANDICE Herald, MD;  Location: [facility] OR;  Service: Orthopedics;  Laterality: Right;   UPPER GASTROINTESTINAL ENDOSCOPY     VAGINAL HYSTERECTOMY     for endometriosis    Allergies:  Allergies  Allergen Reactions   Dextromethorphan-Guaifenesin Nausea And Vomiting   Morphine Nausea And Vomiting   Gabapentin Other (See Comments)    Dizziness, made her feel loopy   Penicillins Rash    > 30 years ago; best I can remember it was just a light rash on my arm Did it involve swelling of the face/tongue/throat, SOB, or low BP? No Did it involve sudden or severe rash/hives, skin peeling, or any reaction on the inside of your mouth or nose? Unknown Did you need to seek medical attention at a hospital or doctor's office? No When did it last happen?      more than 30 years  If all above answers are "NO", may proceed with cephalosporin use.     Family History:  Family History  Problem Relation Age of Onset    Lung cancer Mother    Dementia Father    CAD Father        dx in his 52s   Stroke Father    Congestive Heart Failure Father    Colon cancer Maternal Grandfather    Esophageal cancer Brother    Breast cancer Sister    Diabetes Other        Aunt   Colon cancer Maternal Uncle        2   Hepatitis C Brother    Rectal cancer Neg Hx    Stomach cancer Neg Hx    Colon polyps Neg  Hx     Social History:  Social History   Tobacco Use   Smoking status: Former    Current packs/day: 0.00    Average packs/day: 0.5 packs/day for 10.0 years (5.0 ttl pk-yrs)    Types: Cigarettes    Start date: 02/08/1965    Quit date: 02/09/1975    Years since quitting: 48.8   Smokeless tobacco: Never   Tobacco comments:    started at age 89.   quit in the 2s.  Vaping Use   Vaping status: Never Used  Substance Use Topics   Alcohol use: Not Currently   Drug use: No    Comment: CBD and hemp OIL     ROS: Constitutional:  Negative for fever, chills, weight loss CV: Negative for chest pain, previous MI, hypertension Respiratory:  Negative for shortness of breath, wheezing, sleep apnea, frequent cough GI:  Negative for nausea, vomiting, bloody stool, GERD   Physical exam: BP (!) 144/65   Pulse 85   Ht 5' 1 (1.549 m)   Wt 248 lb (112.5 kg)   BMI 46.86 kg/m  GENERAL APPEARANCE:  Well appearing, well developed, well nourished, NAD HEENT:  Atraumatic, normocephalic, oropharynx clear NECK:  Supple without lymphadenopathy or thyromegaly ABDOMEN:  Soft, non-tender, no masses EXTREMITIES:  Moves all extremities well, without clubbing, cyanosis, or edema NEUROLOGIC:  Alert and oriented x 3, normal gait, CN II-XII grossly intact MENTAL STATUS:  appropriate BACK:  Non-tender to palpation, No CVAT SKIN:  Warm, dry, and intact  Results: U/A: 0-5 WBC, 0-2 RBC

## 2023-12-22 ENCOUNTER — Other Ambulatory Visit: Payer: Self-pay | Admitting: Internal Medicine

## 2023-12-28 ENCOUNTER — Other Ambulatory Visit: Payer: Self-pay | Admitting: Internal Medicine

## 2024-01-11 ENCOUNTER — Other Ambulatory Visit: Payer: Self-pay

## 2024-01-17 MED ORDER — METOPROLOL SUCCINATE ER 50 MG PO TB24: ORAL_TABLET | ORAL | 0 refills | Status: DC

## 2024-01-20 DIAGNOSIS — E43 Unspecified severe protein-calorie malnutrition: Secondary | ICD-10-CM | POA: Diagnosis not present

## 2024-01-20 DIAGNOSIS — R443 Hallucinations, unspecified: Secondary | ICD-10-CM | POA: Diagnosis not present

## 2024-02-09 ENCOUNTER — Other Ambulatory Visit: Payer: Self-pay | Admitting: Internal Medicine

## 2024-02-10 ENCOUNTER — Ambulatory Visit: Admitting: Urology

## 2024-02-10 NOTE — Progress Notes (Deleted)
 "  Assessment: 1. Urge incontinence   2. History of UTI     Plan: Continue bladder diet  Continue Myrbetriq  50 mg daily.   Recommend cephalexin  250 mg daily for UTI prevention Return to office in 2 months  Chief Complaint:  No chief complaint on file.   History of Present Illness:  [name] is a 79 y.o. female who is seen for further evaluation of urinary incontinence. At her initial visit in August 2025, she reported symptoms of frequency, urgency, nocturia, urge incontinence, and unaware incontinence at night.  She had symptoms for at least 3 months with some recent worsening.  She was using adult diapers/pads throughout the day for her incontinence.  No dysuria or gross hematuria.  She reported that she was recently treated for UTI diagnosed at urgent care.  No culture results available. She does not have problems with constipation.  She actually has frequent loose bowel movements associated with her IBS. She previously saw Dr. MacDiarmid with [facility].  Her last visit was in June 2018.  Review of the chart suggest improvement of her frequency and urge incontinence with Myrbetriq  25 mg.  She was also seen for chronic cystitis.  Urine culture from 09/09/2023 grew >100 K Klebsiella.  Treated with cefdinir  x 7 days. She was given a trial of solifenacin  10 mg daily in August 2025.  At her visit in September 2025, she continued on solifenacin  10 mg daily.  She continued to have symptoms of frequency and urgency as well as urge incontinence and unaware incontinence.  She continued to require adult diapers throughout the day.  No side effects from the solifenacin .  No dysuria or gross hematuria. Urine culture grew >100 K Klebsiella.  Treated with Bactrim  x 5 days. She was also changed to Myrbetriq  50 mg daily.  At her visit in October 2025, she noted improvement in her incontinence symptoms with Myrbetriq  50 mg daily.  She reported 1 episode of incontinence since starting the  medication.  No dysuria or gross hematuria.  She reported that she was diagnosed with a UTI while vacationing in [place] in September 2025.  Portions of the above documentation were copied from a prior visit for review purposes only.   Past Medical History:  Past Medical History:  Diagnosis Date   Allergy    Anemia    Anxiety    Barrett's esophagus    Bilateral carpal tunnel syndrome    Dr. Deward, having injection therapy   C. difficile diarrhea 08/01/2012   severe 2014   Carotid arterial disease    Cataract    removed both eyes    Chronic combined systolic and diastolic CHF (congestive heart failure) (HCC)    Chronic cystitis    Dr. Gaston   Chronic lower back pain    Chronic pain syndrome    Depression    Dizziness    DJD (degenerative joint disease)    bilateral hands; knees   Family history of anesthesia complication    Mother had severe N/V   Fibromyalgia    GERD (gastroesophageal reflux disease)    H/O cardiac catheterization    (-) cath 12-2002  , cath again 2011 (-)   History of colon polyps    History of nuclear stress test    Myoview  3/23: EF 51, no ischemia; low risk (echocardiogram in 11/22 with normal EF)   HTN (hypertension)    Hyperlipidemia    past hx    IBS (irritable bowel syndrome)  Insomnia    Migraine    Morbid obesity (HCC)    Non-ischemic cardiomyopathy (HCC)    Osteoporosis    pt unsure of this   RLS (restless legs syndrome)    Vertigo    Vitamin B 12 deficiency 04/09/2013    Past Surgical History:  Past Surgical History:  Procedure Laterality Date   Arm surgery Left    don't remember what they did; arm wasn't broken   BILATERAL KNEE ARTHROSCOPY Bilateral    BIOPSY  08/07/2018   Procedure: BIOPSY;  Surgeon: Aneita Gwendlyn DASEN, MD;  Location: [facility];  Service: Endoscopy;;   CARDIAC CATHETERIZATION  01/22/10   clean cath   CATARACT EXTRACTION W/ INTRAOCULAR LENS  IMPLANT, BILATERAL Bilateral    CHOLECYSTECTOMY N/A  10/25/2016   Procedure: LAPAROSCOPIC CHOLECYSTECTOMY;  Surgeon: Sebastian Moles, MD;  Location[facility] OR;  Service: General;  Laterality: N/A;   COLONOSCOPY     COLONOSCOPY W/ POLYPECTOMY     ESOPHAGOGASTRODUODENOSCOPY (EGD) WITH PROPOFOL  N/A 08/07/2018   Procedure: ESOPHAGOGASTRODUODENOSCOPY (EGD) WITH PROPOFOL ;  Surgeon: Aneita Gwendlyn DASEN, MD;  Location: [facility];  Service: Endoscopy;  Laterality: N/A;   FOOT SURGERY Bilateral    toenails removed; callus removed on right; hammertoes right   Knot     removed from right neck; not a goiter   LUMBAR DISC SURGERY     L5 S1 anterior fusion   OOPHORECTOMY     RIGHT/LEFT HEART CATH AND CORONARY ANGIOGRAPHY N/A 05/06/2017   Procedure: RIGHT/LEFT HEART CATH AND CORONARY ANGIOGRAPHY;  Surgeon: Mady Bruckner, MD;  Location: [facility];  Service: Cardiovascular;  Laterality: N/A;   SHOULDER ARTHROSCOPY Right    TOTAL KNEE ARTHROPLASTY  10/05/2011   Procedure: TOTAL KNEE ARTHROPLASTY;  Surgeon: Maude KANDICE Herald, MD;  Location: [facility] OR;  Service: Orthopedics;  Laterality: Right;   UPPER GASTROINTESTINAL ENDOSCOPY     VAGINAL HYSTERECTOMY     for endometriosis    Allergies:  Allergies  Allergen Reactions   Dextromethorphan-Guaifenesin Nausea And Vomiting   Morphine Nausea And Vomiting   Gabapentin Other (See Comments)    Dizziness, made her feel loopy   Penicillins Rash    > 30 years ago; best I can remember it was just a light rash on my arm Did it involve swelling of the face/tongue/throat, SOB, or low BP? No Did it involve sudden or severe rash/hives, skin peeling, or any reaction on the inside of your mouth or nose? Unknown Did you need to seek medical attention at a hospital or doctor's office? No When did it last happen?      more than 30 years  If all above answers are NO, may proceed with cephalosporin use.     Family History:  Family History  Problem Relation Age of Onset   Lung cancer Mother    Dementia Father     CAD Father        dx in his 49s   Stroke Father    Congestive Heart Failure Father    Colon cancer Maternal Grandfather    Esophageal cancer Brother    Breast cancer Sister    Diabetes Other        Aunt   Colon cancer Maternal Uncle        2   Hepatitis C Brother    Rectal cancer Neg Hx    Stomach cancer Neg Hx    Colon polyps Neg Hx     Social History:  Social History  Tobacco Use   Smoking status: Former    Current packs/day: 0.00    Average packs/day: 0.5 packs/day for 10.0 years (5.0 ttl pk-yrs)    Types: Cigarettes    Start date: 02/08/1965    Quit date: 02/09/1975    Years since quitting: 49.0   Smokeless tobacco: Never   Tobacco comments:    started at age 62.   quit in the 85s.  Vaping Use   Vaping status: Never Used  Substance Use Topics   Alcohol  use: Not Currently   Drug use: No    Comment: CBD and hemp OIL     ROS: Constitutional:  Negative for fever, chills, weight loss CV: Negative for chest pain, previous MI, hypertension Respiratory:  Negative for shortness of breath, wheezing, sleep apnea, frequent cough GI:  Negative for nausea, vomiting, bloody stool, GERD   Physical exam: There were no vitals taken for this visit. GENERAL APPEARANCE:  Well appearing, well developed, well nourished, NAD HEENT:  Atraumatic, normocephalic, oropharynx clear NECK:  Supple without lymphadenopathy or thyromegaly ABDOMEN:  Soft, non-tender, no masses EXTREMITIES:  Moves all extremities well, without clubbing, cyanosis, or edema NEUROLOGIC:  Alert and oriented x 3, normal gait, CN II-XII grossly intact MENTAL STATUS:  appropriate BACK:  Non-tender to palpation, No CVAT SKIN:  Warm, dry, and intact  Results: U/A:  "

## 2024-02-12 ENCOUNTER — Other Ambulatory Visit: Payer: Self-pay | Admitting: Cardiology

## 2024-02-20 ENCOUNTER — Encounter: Payer: Self-pay | Admitting: *Deleted

## 2024-03-02 ENCOUNTER — Ambulatory Visit: Admitting: Internal Medicine

## 2024-03-12 ENCOUNTER — Other Ambulatory Visit: Payer: Self-pay | Admitting: Urology

## 2024-03-12 DIAGNOSIS — Z87440 Personal history of urinary (tract) infections: Secondary | ICD-10-CM

## 2024-06-01 ENCOUNTER — Ambulatory Visit: Admitting: Cardiology

## 2024-06-08 ENCOUNTER — Ambulatory Visit: Admitting: Cardiology

## 2024-09-12 ENCOUNTER — Ambulatory Visit
